# Patient Record
Sex: Female | Born: 1960
Health system: Southern US, Community
[De-identification: ages and names within clinical notes are randomized; demographics above are authoritative.]

## PROBLEM LIST (undated history)

## (undated) DIAGNOSIS — I471 Supraventricular tachycardia, unspecified: Secondary | ICD-10-CM

## (undated) DIAGNOSIS — I456 Pre-excitation syndrome: Secondary | ICD-10-CM

## (undated) DIAGNOSIS — I10 Essential (primary) hypertension: Secondary | ICD-10-CM

## (undated) DIAGNOSIS — E119 Type 2 diabetes mellitus without complications: Secondary | ICD-10-CM

## (undated) DIAGNOSIS — M1990 Unspecified osteoarthritis, unspecified site: Secondary | ICD-10-CM

## (undated) DIAGNOSIS — R51 Headache: Secondary | ICD-10-CM

## (undated) DIAGNOSIS — I671 Cerebral aneurysm, nonruptured: Secondary | ICD-10-CM

## (undated) DIAGNOSIS — R519 Headache, unspecified: Secondary | ICD-10-CM

## (undated) DIAGNOSIS — E079 Disorder of thyroid, unspecified: Secondary | ICD-10-CM

## (undated) HISTORY — PX: BREAST SURGERY: SHX581

## (undated) HISTORY — PX: ELECTROPHYSIOLOGIC STUDY: SHX172A

---

## 1998-07-26 ENCOUNTER — Emergency Department (HOSPITAL_COMMUNITY): Admission: EM | Admit: 1998-07-26 | Discharge: 1998-07-26 | Payer: Self-pay | Admitting: Emergency Medicine

## 1998-10-01 ENCOUNTER — Encounter: Admission: RE | Admit: 1998-10-01 | Discharge: 1998-12-30 | Payer: Self-pay

## 2001-06-21 ENCOUNTER — Encounter: Payer: Self-pay | Admitting: Internal Medicine

## 2001-06-21 ENCOUNTER — Ambulatory Visit (HOSPITAL_COMMUNITY): Admission: RE | Admit: 2001-06-21 | Discharge: 2001-06-21 | Payer: Self-pay | Admitting: Internal Medicine

## 2001-12-17 ENCOUNTER — Emergency Department (HOSPITAL_COMMUNITY): Admission: EM | Admit: 2001-12-17 | Discharge: 2001-12-17 | Payer: Self-pay | Admitting: Emergency Medicine

## 2004-01-24 ENCOUNTER — Ambulatory Visit (HOSPITAL_COMMUNITY): Admission: RE | Admit: 2004-01-24 | Discharge: 2004-01-24 | Payer: Self-pay | Admitting: Family Medicine

## 2004-01-31 ENCOUNTER — Ambulatory Visit (HOSPITAL_COMMUNITY): Admission: RE | Admit: 2004-01-31 | Discharge: 2004-01-31 | Payer: Self-pay | Admitting: Family Medicine

## 2004-02-29 ENCOUNTER — Ambulatory Visit (HOSPITAL_COMMUNITY): Admission: RE | Admit: 2004-02-29 | Discharge: 2004-02-29 | Payer: Self-pay | Admitting: Family Medicine

## 2004-06-13 ENCOUNTER — Emergency Department (HOSPITAL_COMMUNITY): Admission: EM | Admit: 2004-06-13 | Discharge: 2004-06-13 | Payer: Self-pay | Admitting: Emergency Medicine

## 2006-06-02 ENCOUNTER — Ambulatory Visit (HOSPITAL_COMMUNITY): Admission: RE | Admit: 2006-06-02 | Discharge: 2006-06-02 | Payer: Self-pay | Admitting: Family Medicine

## 2007-11-16 ENCOUNTER — Other Ambulatory Visit: Admission: RE | Admit: 2007-11-16 | Discharge: 2007-11-16 | Payer: Self-pay | Admitting: Family Medicine

## 2007-11-22 ENCOUNTER — Ambulatory Visit (HOSPITAL_COMMUNITY): Admission: RE | Admit: 2007-11-22 | Discharge: 2007-11-22 | Payer: Self-pay | Admitting: Family Medicine

## 2008-12-29 ENCOUNTER — Ambulatory Visit: Payer: Self-pay | Admitting: *Deleted

## 2008-12-30 ENCOUNTER — Other Ambulatory Visit: Payer: Self-pay | Admitting: Emergency Medicine

## 2008-12-30 ENCOUNTER — Inpatient Hospital Stay (HOSPITAL_COMMUNITY): Admission: EM | Admit: 2008-12-30 | Discharge: 2008-12-30 | Payer: Self-pay | Admitting: Internal Medicine

## 2009-01-09 ENCOUNTER — Ambulatory Visit: Payer: Self-pay | Admitting: Internal Medicine

## 2009-02-15 ENCOUNTER — Ambulatory Visit: Payer: Self-pay | Admitting: Internal Medicine

## 2009-02-16 ENCOUNTER — Ambulatory Visit: Payer: Self-pay | Admitting: Internal Medicine

## 2009-02-16 ENCOUNTER — Ambulatory Visit (HOSPITAL_COMMUNITY): Admission: RE | Admit: 2009-02-16 | Discharge: 2009-02-17 | Payer: Self-pay | Admitting: Internal Medicine

## 2009-03-20 DIAGNOSIS — J40 Bronchitis, not specified as acute or chronic: Secondary | ICD-10-CM | POA: Insufficient documentation

## 2009-03-20 DIAGNOSIS — I456 Pre-excitation syndrome: Secondary | ICD-10-CM | POA: Insufficient documentation

## 2009-03-20 DIAGNOSIS — I10 Essential (primary) hypertension: Secondary | ICD-10-CM | POA: Insufficient documentation

## 2009-03-20 DIAGNOSIS — F17200 Nicotine dependence, unspecified, uncomplicated: Secondary | ICD-10-CM | POA: Insufficient documentation

## 2009-03-20 DIAGNOSIS — I471 Supraventricular tachycardia: Secondary | ICD-10-CM | POA: Insufficient documentation

## 2009-03-26 ENCOUNTER — Encounter: Payer: Self-pay | Admitting: Internal Medicine

## 2009-03-26 ENCOUNTER — Ambulatory Visit: Payer: Self-pay | Admitting: Internal Medicine

## 2011-01-12 ENCOUNTER — Encounter: Payer: Self-pay | Admitting: Family Medicine

## 2011-01-21 NOTE — Assessment & Plan Note (Signed)
Summary: [facility]   CC:  pt complains of chest pain last week and sob also pt states hearts skipping beats also pt is not on any meds.  History of Present Illness: Ms. [name] [name] reuturns today for follow-up.  She is a middle aged woman with a h/o SVT and heart rates of over 200 beats per minute.  She underwent EP study and ablation several weeks ago.  At that time she was found to have a manifest accessory pathway which was mapped to the anteroseptal space.  She underwent successful ablation resulting in resolution of accessory pathway conduction.  Since then she has had very minimal C/P, no radiation, SOB or syncope.  The pain is not exertional.  No exacerbating features.  She has had minimal palpitations lasting seconds.  She admits to dietary indiscretion with sodium.  Allergies: No Known Drug Allergies  Past History:  Past Medical History:    HYPERTENSION, UNSPECIFIED (ICD-401.9)    SMOKER (ICD-305.1)    SVT/ PSVT/ PAT (ICD-427.0)    Hx of BRONCHITIS (ICD-490)    WPW (ICD-426.7)     (03/20/2009)  Past Surgical History:    S/P SVT ablation 02/2009  Vital Signs:  Patient profile:   50 year old female Weight:      198 pounds Pulse rate:   79 / minute BP sitting:   120 / 80  (left arm)  Vitals Entered By: Kem Parkinson (March 26, 2009 4:02 PM)  Physical Exam  General:  Well developed, well nourished, in no acute distress. Head:  normocephalic and atraumatic Eyes:  PERRLA/EOM intact; conjunctiva and lids normal. Mouth:  Teeth, gums and palate normal except poor dentition. Oral mucosa normal. Neck:  Neck supple, no JVD. No masses, thyromegaly or abnormal cervical nodes. Lungs:  Clear bilaterally to auscultation and percussion. Heart:  Non-displaced PMI, chest non-tender; regular rate and rhythm, S1, S2 without murmurs, rubs or gallops. Carotid upstroke normal, no bruit. Normal abdominal aortic size, no bruits. Femorals normal pulses, no bruits. Pedals normal pulses.  No edema, no varicosities. Abdomen:  Bowel sounds positive; abdomen soft and non-tender without masses, organomegaly, or hernias noted. No hepatosplenomegaly. Msk:  Back normal, normal gait. Muscle strength and tone normal. Pulses:  pulses normal in all 4 extremities Extremities:  No clubbing or cyanosis. Neurologic:  Alert and oriented x 3.   EKG  Procedure date:  03/26/2009  Findings:      NSR without evidence of ventricular pre-excitation.  Impression & Recommendations:  Problem # 1:  SVT/ PSVT/ PAT (ICD-427.0) She is now s/p successful ablation and doing well.  No evidence of additional SVT or accessory pathway conduction.  Problem # 2:  HYPERTENSION, UNSPECIFIED (ICD-401.9) I discussed the importance of sodium restriction.

## 2011-04-07 LAB — CARDIAC PANEL(CRET KIN+CKTOT+MB+TROPI)
CK, MB: 1.8 ng/mL (ref 0.3–4.0)
Relative Index: 0.9 (ref 0.0–2.5)
Total CK: 192 U/L — ABNORMAL HIGH (ref 7–177)
Troponin I: <0.01 ng/mL (ref 0.00–0.06)

## 2011-04-07 LAB — CBC
HCT: 42 % (ref 36.0–46.0)
Hemoglobin: 13.1 g/dL (ref 12.0–15.0)
MCHC: 31.3 g/dL (ref 30.0–36.0)
MCV: 82.7 fL (ref 78.0–100.0)
Platelets: 280 10*3/uL (ref 150–400)
RBC: 5.08 MIL/uL (ref 3.87–5.11)
RDW: 16.2 % — ABNORMAL HIGH (ref 11.5–15.5)
WBC: 9.9 10*3/uL (ref 4.0–10.5)

## 2011-04-07 LAB — POCT CARDIAC MARKERS
CKMB, poc: <1 ng/mL — ABNORMAL LOW (ref 1.0–8.0)
CKMB, poc: <1 ng/mL — ABNORMAL LOW (ref 1.0–8.0)
Myoglobin, poc: 82.7 ng/mL (ref 12–200)
Myoglobin, poc: 98.1 ng/mL (ref 12–200)
Troponin i, poc: <0.05 ng/mL (ref 0.00–0.09)
Troponin i, poc: <0.05 ng/mL (ref 0.00–0.09)

## 2011-04-07 LAB — BASIC METABOLIC PANEL
BUN: 10 mg/dL (ref 6–23)
CO2: 25 mEq/L (ref 19–32)
Calcium: 9.3 mg/dL (ref 8.4–10.5)
Chloride: 105 mEq/L (ref 96–112)
Creatinine, Ser: 1.13 mg/dL (ref 0.4–1.2)
GFR calc Af Amer: >60 mL/min (ref >60)
GFR calc non Af Amer: 51 mL/min — ABNORMAL LOW (ref >60)
Glucose, Bld: 96 mg/dL (ref 70–99)
Potassium: 3.4 mEq/L — ABNORMAL LOW (ref 3.5–5.1)
Sodium: 140 mEq/L (ref 135–145)

## 2011-04-07 LAB — DIFFERENTIAL
Basophils Absolute: 0.2 10*3/uL — ABNORMAL HIGH (ref 0.0–0.1)
Basophils Relative: 2 % — ABNORMAL HIGH (ref 0–1)
Eosinophils Absolute: 0.1 10*3/uL (ref 0.0–0.7)
Eosinophils Relative: 1 % (ref 0–5)
Lymphocytes Relative: 32 % (ref 12–46)
Lymphs Abs: 3.1 10*3/uL (ref 0.7–4.0)
Monocytes Absolute: 1.1 10*3/uL — ABNORMAL HIGH (ref 0.1–1.0)
Monocytes Relative: 12 % (ref 3–12)
Neutro Abs: 5.3 10*3/uL (ref 1.7–7.7)
Neutrophils Relative %: 54 % (ref 43–77)

## 2011-04-07 LAB — TSH: TSH: 10.004 u[IU]/mL — ABNORMAL HIGH (ref 0.350–4.500)

## 2011-05-06 NOTE — Assessment & Plan Note (Signed)
[facility]                         ELECTROPHYSIOLOGY OFFICE NOTE   NAME:[name] [name] HIPPERT                      MRN:          [id number]  DATE:01/09/2009                            DOB:          [birth year]    [name] [name] is referred by Dr. Loleta Chance and Dr. Dietrich Pates and Dr. Daleen Squibb for  evaluation of WPW syndrome and documented SVT.   The patient is a 50 year old.  She has had longstanding tachy  palpitations dating back her teenage years.  The patient has had  intermittent episodes of SVT at rates of up to 200 beats per minute.  She was most recently seen in the emergency department where she was in  a narrow QRS tachycardia at 198 beats per minute.  This was treated with  adenosine x2 with eventual termination of her SVT.  With SVT, she has  chest pressure and shortness of breath.  She has never had frank  syncope.  The patient has otherwise been stable.   Her past medical history is notable for thyroid problems with  radioactive iodine and thyroid replacement.  She has a history of  borderline hypertension.   Her family history is notable for father with coronary artery disease,  otherwise unremarkable.   SOCIAL HISTORY:  The patient denies alcohol abuse, but does smoke  cigarettes on a regular basis typically between whole and half pack a  day and has done so for all of her adult life.  She drinks 1-2  caffeinated beverages daily as well.  She has no other surgeries in the  past.  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  She is married.   Her review of systems is as noted in the HPI, otherwise she has very  minimal arthritic symptoms on physical.  Otherwise, all systems are  negative.   PHYSICAL EXAMINATION:  GENERAL:  She is a pleasant 50 year old woman in  no acute distress.  VITAL SIGNS:  Blood pressure today was 118/80, pulse was 64 and regular,  respirations were 16, and the weight was 200 pounds.  HEENT:  Normocephalic and atraumatic.   Oropharynx is moist.  Sclerae anicteric.  NECK:  No jugular venous distention.  There is no thyromegaly.  Trachea  is midline.  The carotids are 2+ and symmetric.  LUNGS:  Clear bilaterally to auscultation.  No wheezes, rales, or  rhonchi present.  There is no increased work of breathing.  CARDIOVASCULAR:  Regular rate and rhythm.  Normal S1 and S2.  There is  soft S4 gallop.  There were no murmurs, rubs, or gallops, otherwise.  ABDOMEN:  Soft, nontender.  There is no organomegaly.  EXTREMITIES:  No cyanosis, clubbing, or edema.  Pulses are 2+ and  symmetric.  NEUROLOGIC:  Alert and oriented x3 with cranial nerves intact.  Strength  was 5/5 and symmetric.   EKG demonstrates sinus rhythm with a very short PR interval consistent  with Wolff-Parkinson-White syndrome.  The patient's 12-lead EKG  demonstrates a very very short interval and a delta wave, which  transitions from negative to positive in lead V4.  The delta wave is  also positive in II, III, and F.  The aVR was negative and aVL was  positive.  These findings are suggestive of a right free wall or perhaps  a right anteroseptal accessory pathway.   IMPRESSION:  Symptomatic Wolff-Parkinson-White syndrome.  I have  discussed the treatment options with the patient.  The risks, benefits,  goals, and expectations of catheter ablation for supraventricular  tachycardia have been discussed with her.  She will call us to schedule  catheter ablation therapy.     Doylene Canning. Ladona Ridgel, MD  Electronically Signed    GWT/MedQ  DD: 01/09/2009  DT: 01/10/2009  Job #: [id number]   cc:   Thomas C. Wall, MD, [facility]

## 2011-05-06 NOTE — Discharge Summary (Signed)
NAMEAILANY, [name] NO.:  [id number]   MEDICAL RECORD NO.:  [id number]          PATIENT TYPE:  OIB   LOCATION:  2027                         FACILITY:  [facility]   PHYSICIAN:  Duke Salvia, MD, FACCDATE OF BIRTH:  [birth year]   DATE OF ADMISSION:  02/16/2009  DATE OF DISCHARGE:  02/17/2009                               DISCHARGE SUMMARY   DISCHARGE DIAGNOSIS:  Status post radiofrequency catheter ablation for  Wolff-Parkinson-White.  The patient being treated with aspirin 81 mg  daily for 6 weeks.  Follow up with Dr. Ladona Ridgel in [place] in 6-8  weeks.  Office will arrange appointment.   MEDICATION AT TIME OF DISCHARGE:  Aspirin 81 mg x6 weeks.   The patient is followed by Dr. Loleta Chance and Dr. Dietrich Pates.  She has a history  of WPW syndrome and documented SVT.  She was seen by Dr. Lewayne Bunting in  consultation for consideration of an ablation.  The patient agreed to  proceed and was admitted on day of admission for procedure, tolerated  procedure without complications.  No evidence of heart block  postprocedure.  No inducible SVT.  Postprocedure, the patient was  transferred to telemetry.  For observation overnight, Dr. Graciela Husbands saw the  patient on day of discharge, right groin is stable.  The patient  afebrile, being discharged home, and follow up as stated above.   DURATION OF DISCHARGE ENCOUNTER:  Less than 30 minutes.      Dorian Pod, ACNP      Duke Salvia, MD, [facility]  Electronically Signed    MB/MEDQ  D:  02/17/2009  T:  02/17/2009  Job:  [id number]

## 2011-05-06 NOTE — Discharge Summary (Signed)
NAME:  [name] [name] NO.:  [id number]   MEDICAL RECORD NO.:  [id number]          PATIENT TYPE:  INP   LOCATION:  6527                         FACILITY:  [facility]   PHYSICIAN:  Thomas C. Wall, MD, FACCDATE OF BIRTH:  1961/01/17   DATE OF ADMISSION:  12/30/2008  DATE OF DISCHARGE:  12/30/2008                               DISCHARGE SUMMARY   PROCEDURES:  None.   PRIMARY/FINAL DISCHARGE DIAGNOSIS:  Tachy palpitations, atrioventricular  nodal reentry tachycardia with possible Wolff-Parkinson-White.   SECONDARY DIAGNOSES:  1. Family history of coronary artery disease in her father.  2. Obesity with a body mass index of 34.9.  3. Hypokalemia with potassium of 3.4 on admission.  4. Borderline hypertension with systolic blood pressure of 139 and      diastolic blood pressure between 76 and 104.   TIME AT DISCHARGE:  32 minutes.   HOSPITAL COURSE:  [name] [name] is a 50 year old female with a history of  arrhythmia, possibly WPW.  She had tachy palpitations and came to the  hospital.  The tachy palpitations were associated with substernal chest  pain and diaphoresis.  The EKG demonstrated narrow QRS tachycardia with  a nonspecific intraventricular block.  There was a possibility of a  delta wave versus LGL variant.  She was admitted for further evaluation.   Her cardiac enzymes were negative for MI.  Her other labs were within  normal limits except for a potassium that was slightly low at 3.4 and  was supplemented.  A TSH is pending.   On December 30, 2008, [name] [name]'s symptoms had resolved.  She was having  no further palpitations or chest pain.  Dr. Daleen Squibb evaluated her and felt  she was stable for discharge on December 30, 2008.   DISCHARGE INSTRUCTIONS:  Activity level is to be increased gradually.  She is to stick to a low-fat diet.  She is to followup with [facility]  Cardiology EP and our office will call her.  She is to follow up with  Dr. Loleta Chance as needed.   DISCHARGE MEDICATIONS:  None for now.      Theodore Demark, PA-C      Jesse Sans. Daleen Squibb, MD, [facility]  Electronically Signed    RB/MEDQ  D:  12/30/2008  T:  12/30/2008  Job:  [id number]   cc:   Annia Friendly. Loleta Chance, MD
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
## 2011-05-06 NOTE — Op Note (Signed)
[name] [name], [name] [name] NO.:  [id number]   MEDICAL RECORD NO.:  [id number]          PATIENT TYPE:  OIB   LOCATION:  2027                         FACILITY:  [facility]   PHYSICIAN:  Doylene Canning. Ladona Ridgel, MD    DATE OF BIRTH:  1961-06-28   DATE OF PROCEDURE:  02/16/2009  DATE OF DISCHARGE:                               OPERATIVE REPORT   PROCEDURE PERFORMED:  Electrophysiologic study and radiofrequency  catheter ablation of arteriovenous reentrant tachycardia utilizing a  manifest para-Hisian accessory pathway.   INTRODUCTION:  The patient is a 50 year old woman with a longstanding  history of tachy palpitations with documented episodes of SVT at rates  of up to 220 beats per minute.  She has clear-cut pre-excitation on her  12 lead EKG.  The patient presents to the hospital today for  electrophysiologic study and catheter ablation after she had recurrent  episodes of SVT and had been unable to tolerate medical therapy.   PROCEDURE:  After informed consent was obtained, the patient was taken  to the diagnostic EP lab in the fasting state.  After usual preparation  and draping, intravenous fentanyl and midazolam was given for sedation.  A 6-French hexapolar catheter was inserted percutaneously into the right  femoral vein and advanced under fluoroscopic guidance to the right  ventricle.  A 5-French quadripolar catheter was inserted percutaneously  in the right femoral vein and advanced to the His bundle region.  A 6-  [place] hexapolar catheter was inserted percutaneously into the right  jugular vein and advanced to the coronary sinus.  After measurement of  the basic intervals, rapid ventricular pacing was carried out from the  RV apex demonstrating midline, but nondecremental VA conduction, the  pathway block was 320 msec.  Next, programmed ventricular stimulation  was carried out from the RV apex at base drive cycle length of 409 msec.  The S1-S2 interval was stepwise  decreased down to 340 msec where the  retrograde pathway block was demonstrated.  At this point, programmed  atrial stimulation was carried out from the coronary sinus at base drive  cycle length of 811 msec and the S1-S2 interval stepwise decreased down  to under 330 msec where the antegrade pathway block was demonstrated.  Additional decrements down demonstrated AV node ERP.  At this point,  rapid atrial pacing was carried out from the coronary sinus at a pacing  cycle length of 490 msec and stepwise decreased down to approximately  320 msec resulting in the initiation of atrial fibrillation.  The  patient required cardioversion on 2 separate occasions.  It should be  noted that following atrial fibrillation, additional programmed atrial  stimulation was carried out from the coronary sinus demonstrating AV  reentrant tachycardia utilizing a para-Hisian accessory pathway for the  retrograde limb of the circuit.  This pathway location was confirmed and  the 20-pole Halo catheter was inserted into the right femoral vein and  advanced under fluoroscopic guidance into the right atrium and placed  around tricuspid valve annulus.  During tachycardia, the earliest atrial  activation was in the midline anteroseptal/para-Hisian space.  At this  point, a 7-French quadripolar ablation catheter was maneuvered by way of  the right femoral vein into the right atrium and mapping was carried  out.  Mapping was carried out both with rapid atrial pacing from the  Halo catheter near the patient's accessory pathway as well as from the  pera-Hisian space just inside the right ventricle.  Again, this  demonstrated fusion of atrial activation and ventricular activation both  pacing the A as well as pacing the V along the region of the para-Hisian  space.  The earliest activation was found just above the His catheter at  about 1 o'clock in the LAO projection.  Additional fine mapping was  carried out and with  interspace less than one catheter's width from the  His bundle catheter, the pathway was minimally bumped resulting in loss  of pre-excitation.  Fortunately, pre-excitation came back within a  matter of a minute and additional mapping was carried out followed by a  single RF energy application, which was delivered for 60 seconds.  This  resulted in termination of accessory pathway conduction within a few  seconds of RF energy application.  During this time, the PR interval  remained unchanged once accessory pathway conduction was abolished.  There is no junctional rhythm noted.  The patient was observed for 40  minutes.  During this time, there was no return of accessory pathway  conduction and rapid ventricular pacing then carried out demonstrating  VA dissociation at 600 msec.  Programmed atrial stimulation and rapid  atrial pacing were also carried out demonstrating no inducible AFib  (this had been easily inducible prior to ablation) and no inducible SVT.  The catheters were then removed.  Hemostasis was assured and the patient  was returned to her room in satisfactory condition.   COMPLICATIONS:  There were no immediate procedure complications.   RESULTS:  A.  Baseline ECG.  The baseline ECG demonstrates sinus rhythm  with manifest pre-excitation.  B.  Baseline intervals.  The PR interval was 79 msec.  The HV interval  was less than 0.  The QRS duration was 170 msec.  Following ablation,  the AH interval was 112 msec.  The HV interval was 44 msec and a QRS  duration, which was at previously 170 msec became 84 msec.  C.  Rapid ventricular pacing.  Rapid ventricular pacing was carried out  from the RV apex; both before and after ablation.  After ablation, VA  dissociation was present.  During prior to ablation the retrograde  pathway Wenckebach cycle length was 320 msec.  D.  Programmed ventricular stimulation.  Programmed ventricular  stimulation was carried out from the RV apex at  base drive cycle length  of 161 msec.  The S1-S2 interval stepwise decreased to 340 msec with the  retrograde accessory pathway ERP was noted.  E.  Rapid atrial pacing.  Rapid atrial pacing was carried out both  before and after catheter ablation.  The AV Wenckebach cycle length was  330 msec after RF energy application.  Prior to RF energy application,  rapid atrial pacing resulted in the induction of AFib.  F.  Programmed atrial stimulation.  Programmed atrial stimulation was  carried out from the coronary sinus and high right atrium base drive  cycle length of 096 msec.  The S1-S2 interval stepwise decreased to 330  msec where the accessory pathway ERP was observed.  G.  Arrhythmias observed.  1. AV reentry tachycardia.  Initiation was of  programmed atrial      stimulation, duration was sustained, and termination was      spontaneous.  2. Atrial fibrillation initiation was with rapid atrial pacing as well      as spontaneous from SVT.  Duration was sustained termination was      with DC cardioversion as well as with ibutilide.      a.     Mapping.  Mapping of the patient's accessory pathway was       localized to the para-Hisian space just above the AV node.      b.     RF energy application.  Single RF energy application was       delivered resulting in resolution of accessory pathway conduction.       There were no junctional beats noted during RF energy application       and there was no evidence of any creation of heart block.   CONCLUSION:  The study demonstrates successful electrophysiologic study  and RF catheter ablation of a manifest WPW syndrome with inducible AV  reentry tachycardia.  Successful ablation was applied with a single RF  energy application to the para-Hisian space resulting in resolution of  accessory pathway conduction and rendering the patient's tachycardia not  inducible.      Doylene Canning. Ladona Ridgel, MD  Electronically Signed     GWT/MEDQ  D:  02/16/2009   T:  02/16/2009  Job:  [id number]   cc:   Jesse Sans. Daleen Squibb, MD, [facility]  Annia Friendly. Loleta Chance, MD

## 2011-05-06 NOTE — Assessment & Plan Note (Signed)
[facility]                         ELECTROPHYSIOLOGY OFFICE NOTE   NAME:Rund, [name] [name]                      MRN:          [id number]  DATE:02/15/2009                            DOB:          [birth year]    [name] returns today for followup.  She is a very pleasant middle-  aged woman with a history of SVT and WPW syndrome.  She has history of  hypertension.  Since I last saw her, she has had recurrent tachy  palpitations, though they have not been quite as long as usual.  The  most recent, the patient's documented SVT was at 200 beats per minute  back on January 8.  Her baseline ECG demonstrates sinus rhythm with a  left bundle-branch block.  QRS pattern with transition from negative to  positive at lead V3 indicative of either a septal or right free wall  accessory pathway.  I favor the latter as the transition to be positive  V3 rather than V2.  The patient had no other complaints today.  She is  appropriately anxious for her ablation procedure.   PHYSICAL EXAMINATION:  GENERAL:  She is a pleasant, middle-aged woman,  in no acute distress.  VITAL SIGNS:  The blood pressure is 130/80, the pulse was 76 and  regular, respirations were 18.  Weight was 200 pounds.  HEENT:  Normocephalic and atraumatic.  Pupils equal and round.  Oropharynx is  moist.  Sclerae anicteric.  NECK:  No jugular distention.  No thyromegaly.  Trachea is midline.  LUNGS:  Clear bilaterally to auscultation.  No wheezes, rales, or  rhonchi are present and no increased work of breathing.  CARDIOVASCULAR:  Regular rate and rhythm.  Normal S1 and S2.  ABDOMEN:  Soft, nontender.  EXTREMITIES:  Demonstrated no edema.   IMPRESSION:  1. Recurrent supraventricular tachycardia.  2. Wolff-Parkinson-White syndrome.  3. Ongoing tobacco abuse.  4. Hypertension.   DISCUSSION:  I have discussed the treatment options with the patient.  The risks, benefits, goals, and expectations of  electrophysiologic study  and catheter ablation discussed with the patient.  She would like to  proceed, this be scheduled early as possible in convenient time.     Doylene Canning. Ladona Ridgel, MD  Electronically Signed    GWT/MedQ  DD: 02/15/2009  DT: 02/15/2009  Job #: [id number]

## 2013-06-09 ENCOUNTER — Emergency Department (HOSPITAL_COMMUNITY)
Admission: EM | Admit: 2013-06-09 | Discharge: 2013-06-09 | Disposition: A | Discharge status: Home / Self Care | Payer: BC Managed Care – PPO | Attending: Emergency Medicine | Admitting: Emergency Medicine

## 2013-06-09 ENCOUNTER — Encounter (HOSPITAL_COMMUNITY): Payer: Self-pay | Admitting: *Deleted

## 2013-06-09 ENCOUNTER — Emergency Department (HOSPITAL_COMMUNITY): Payer: BC Managed Care – PPO

## 2013-06-09 DIAGNOSIS — S139XXA Sprain of joints and ligaments of unspecified parts of neck, initial encounter: Secondary | ICD-10-CM | POA: Insufficient documentation

## 2013-06-09 DIAGNOSIS — M549 Dorsalgia, unspecified: Secondary | ICD-10-CM | POA: Insufficient documentation

## 2013-06-09 DIAGNOSIS — Y9389 Activity, other specified: Secondary | ICD-10-CM | POA: Insufficient documentation

## 2013-06-09 DIAGNOSIS — R079 Chest pain, unspecified: Secondary | ICD-10-CM | POA: Insufficient documentation

## 2013-06-09 DIAGNOSIS — T148XXA Other injury of unspecified body region, initial encounter: Secondary | ICD-10-CM

## 2013-06-09 DIAGNOSIS — S161XXA Strain of muscle, fascia and tendon at neck level, initial encounter: Secondary | ICD-10-CM

## 2013-06-09 DIAGNOSIS — Y9229 Other specified public building as the place of occurrence of the external cause: Secondary | ICD-10-CM | POA: Insufficient documentation

## 2013-06-09 MED ORDER — IBUPROFEN 800 MG PO TABS
800.0000 mg | ORAL_TABLET | Freq: Once | ORAL | Status: AC
  Administered 2013-06-09: 800 mg via ORAL
  Filled 2013-06-09: qty 1

## 2013-06-09 MED ORDER — CYCLOBENZAPRINE HCL 10 MG PO TABS: 10.0000 mg | ORAL_TABLET | Freq: Three times a day (TID) | ORAL | Status: DC | PRN

## 2013-06-09 MED ORDER — IBUPROFEN 800 MG PO TABS: 800.0000 mg | ORAL_TABLET | Freq: Three times a day (TID) | ORAL | Status: DC
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
## 2013-06-09 NOTE — ED Notes (Signed)
Instructions, prescriptions and f/u information given/reviewed - verbalizes understanding.  
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
## 2013-06-10 NOTE — ED Provider Notes (Signed)
Medical screening examination/treatment/procedure(s) were performed by non-physician practitioner and as supervising physician I was immediately available for consultation/collaboration.    Vida Roller, MD 06/10/13 [phone]

## 2015-08-23 ENCOUNTER — Other Ambulatory Visit (HOSPITAL_COMMUNITY): Payer: Self-pay | Admitting: *Deleted

## 2015-08-23 DIAGNOSIS — Z1231 Encounter for screening mammogram for malignant neoplasm of breast: Secondary | ICD-10-CM

## 2015-08-30 ENCOUNTER — Inpatient Hospital Stay (HOSPITAL_COMMUNITY): Admission: RE | Admit: 2015-08-30 | Payer: Self-pay | Source: Ambulatory Visit

## 2015-09-04 ENCOUNTER — Encounter (HOSPITAL_COMMUNITY): Payer: Self-pay | Admitting: Emergency Medicine

## 2015-09-04 ENCOUNTER — Emergency Department (HOSPITAL_COMMUNITY)
Admission: EM | Admit: 2015-09-04 | Discharge: 2015-09-05 | Disposition: A | Discharge status: Home / Self Care | Payer: Medicaid Other | Attending: Emergency Medicine | Admitting: Emergency Medicine

## 2015-09-04 DIAGNOSIS — Z791 Long term (current) use of non-steroidal anti-inflammatories (NSAID): Secondary | ICD-10-CM | POA: Insufficient documentation

## 2015-09-04 DIAGNOSIS — L509 Urticaria, unspecified: Secondary | ICD-10-CM | POA: Insufficient documentation

## 2015-09-04 DIAGNOSIS — Z720 Tobacco use: Secondary | ICD-10-CM | POA: Insufficient documentation

## 2015-09-04 MED ORDER — PREDNISONE 20 MG PO TABS: ORAL_TABLET | ORAL | Status: DC

## 2015-09-04 MED ORDER — FAMOTIDINE 20 MG PO TABS
20.0000 mg | ORAL_TABLET | Freq: Once | ORAL | Status: AC
  Administered 2015-09-04: 20 mg via ORAL
  Filled 2015-09-04: qty 1

## 2015-09-04 MED ORDER — DIPHENHYDRAMINE HCL 25 MG PO TABS: 25.0000 mg | ORAL_TABLET | ORAL | Status: DC | PRN

## 2015-09-04 MED ORDER — PREDNISONE 50 MG PO TABS
60.0000 mg | ORAL_TABLET | Freq: Once | ORAL | Status: AC
  Administered 2015-09-04: 60 mg via ORAL
  Filled 2015-09-04 (×2): qty 1

## 2015-09-04 MED ORDER — DIPHENHYDRAMINE HCL 25 MG PO CAPS
50.0000 mg | ORAL_CAPSULE | Freq: Once | ORAL | Status: AC
  Administered 2015-09-04: 50 mg via ORAL
  Filled 2015-09-04: qty 2

## 2015-09-04 NOTE — ED Provider Notes (Signed)
CSN: [id number]     Arrival date & time 09/04/15  2223 History   First MD Initiated Contact with Patient 09/04/15 2246     Chief Complaint  Patient presents with  . Rash     (Consider location/radiation/quality/duration/timing/severity/associated sxs/prior Treatment) HPI   [name] [name] is a 54 y.o. female who presents to the Emergency Department complaining of itching rash for 3 days.  She states that she began to notice a red rash to her feet, legs, hands and back.  She states that she has been applying lotion without relief.  She also states she recently completely an antibiotic, unsure of the name, but states it was for an urinary tract infection.  She denies swelling, difficulty swallowing, wheezing or shortness of breath.  She also denies any new exposures or foods.   History reviewed. No pertinent past medical history. History reviewed. No pertinent past surgical history. No family history on file. Social History  Substance Use Topics  . Smoking status: Current Every Day Smoker -- 1.00 packs/day  . Smokeless tobacco: None  . Alcohol Use: No   OB History    No data available     Review of Systems  Constitutional: Negative for fever, chills, activity change and appetite change.  HENT: Negative for facial swelling, sore throat and trouble swallowing.   Respiratory: Negative for chest tightness, shortness of breath and wheezing.   Musculoskeletal: Negative for neck pain and neck stiffness.  Skin: Positive for rash. Negative for wound.  Neurological: Negative for dizziness, weakness, numbness and headaches.  All other systems reviewed and are negative.     Allergies  Review of patient's allergies indicates no known allergies.  Home Medications   Prior to Admission medications   Medication Sig Start Date End Date Taking? Authorizing Provider  cyclobenzaprine (FLEXERIL) 10 MG tablet Take 1 tablet (10 mg total) by mouth 3 (three) times daily as needed for muscle  spasms. 06/09/13   Latham Kinzler, PA-C  ibuprofen (ADVIL,MOTRIN) 800 MG tablet Take 1 tablet (800 mg total) by mouth 3 (three) times daily. 06/09/13   Liviya Santini, PA-C   BP 142/68 mmHg  Pulse 77  Temp(Src) 97.9 F (36.6 C) (Oral)  Resp 18  Ht 5\' 3"  (1.6 m)  Wt 185 lb (83.915 kg)  BMI 32.78 kg/m2  SpO2 100%   Physical Exam  Constitutional: She is oriented to person, place, and time. She appears well-developed and well-nourished. No distress.  HENT:  Head: Normocephalic and atraumatic.  Mouth/Throat: Oropharynx is clear and moist.  Neck: Normal range of motion. Neck supple.  Cardiovascular: Normal rate, regular rhythm, normal heart sounds and intact distal pulses.   No murmur heard. Pulmonary/Chest: Effort normal and breath sounds normal. No respiratory distress.  Musculoskeletal: She exhibits no edema or tenderness.  Lymphadenopathy:    She has no cervical adenopathy.  Neurological: She is alert and oriented to person, place, and time. She exhibits normal muscle tone. Coordination normal.  Skin: Skin is warm. Rash noted. There is erythema.  Erythematous, mildly edematous welts to the dorsal feet, upper back and bilateral legs.  No vesicles or pustules  Nursing note and vitals reviewed.   ED Course  Procedures (including critical care time)   MDM   Final diagnoses:  Urticaria    Pt is well appearing.  Vitals stable.  Airway patent.  No edema.  Mild rash appears c/w hives.  Will give prednisone, Pepcid and benadryl and observe.    2340 on recheck patient is  laughing and talking with family member at bedside.  States she is feeling better and itching has resolved.  She appears stable for d/c and agrees to continue benadryl and prednisone taper prescribed.  Advised to return here for any worsening symtpoms  Kem Parkinson, PA-C 09/04/15 2346  Tanna Furry, MD 09/08/15 [phone]

## 2015-09-04 NOTE — Discharge Instructions (Signed)
Hives  Hives are itchy, red, puffy (swollen) areas of the skin. Hives can change in size and location on your body. Hives can come and go for hours, days, or weeks. Hives do not spread from person to person (noncontagious). Scratching, exercise, and stress can make your hives worse.  HOME CARE  · Avoid things that cause your hives (triggers).  · Take antihistamine medicines as told by your doctor. Do not drive while taking an antihistamine.  · Take any other medicines for itching as told by your doctor.  · Wear loose-fitting clothing.  · Keep all doctor visits as told.  GET HELP RIGHT AWAY IF:   · You have a fever.  · Your tongue or lips are puffy.  · You have trouble breathing or swallowing.  · You feel tightness in the throat or chest.  · You have belly (abdominal) pain.  · You have lasting or severe itching that is not helped by medicine.  · You have painful or puffy joints.  These problems may be the first sign of a life-threatening allergic reaction. Call your local emergency services (911 in [place]  MAKE SURE YOU:   · Understand these instructions.  · Will watch your condition.  · Will get help right away if you are not doing well or get worse.  Document Released: 09/16/2008 Document Revised: 06/08/2012 Document Reviewed: 03/02/2012  [employer]® Patient Information ©2015 [employer]. This information is not intended to replace advice given to you by your health care provider. Make sure you discuss any questions you have with your health care provider.

## 2015-09-04 NOTE — ED Notes (Signed)
Onset 3 day redness to hands and feet, itching

## 2015-11-12 ENCOUNTER — Ambulatory Visit (HOSPITAL_COMMUNITY): Payer: Medicaid Other

## 2015-12-03 ENCOUNTER — Ambulatory Visit (HOSPITAL_COMMUNITY): Payer: Medicaid Other

## 2016-01-03 ENCOUNTER — Encounter (HOSPITAL_COMMUNITY)
Admission: EM | Disposition: A | Discharge status: Home Health | Payer: Self-pay | Source: Home / Self Care | Attending: Neurological Surgery

## 2016-01-03 ENCOUNTER — Inpatient Hospital Stay (HOSPITAL_COMMUNITY): Payer: BLUE CROSS/BLUE SHIELD | Admitting: Anesthesiology

## 2016-01-03 ENCOUNTER — Inpatient Hospital Stay (HOSPITAL_COMMUNITY): Payer: BLUE CROSS/BLUE SHIELD

## 2016-01-03 ENCOUNTER — Encounter (HOSPITAL_COMMUNITY): Payer: Self-pay

## 2016-01-03 ENCOUNTER — Emergency Department (HOSPITAL_COMMUNITY): Payer: BLUE CROSS/BLUE SHIELD

## 2016-01-03 ENCOUNTER — Inpatient Hospital Stay (HOSPITAL_COMMUNITY)
Admission: EM | Admit: 2016-01-03 | Discharge: 2016-01-17 | DRG: 020 | Disposition: A | Discharge status: Home Health | Payer: BLUE CROSS/BLUE SHIELD | Attending: Neurological Surgery | Admitting: Neurological Surgery

## 2016-01-03 DIAGNOSIS — G919 Hydrocephalus, unspecified: Secondary | ICD-10-CM | POA: Diagnosis present

## 2016-01-03 DIAGNOSIS — E878 Other disorders of electrolyte and fluid balance, not elsewhere classified: Secondary | ICD-10-CM | POA: Diagnosis not present

## 2016-01-03 DIAGNOSIS — R0902 Hypoxemia: Secondary | ICD-10-CM | POA: Insufficient documentation

## 2016-01-03 DIAGNOSIS — I471 Supraventricular tachycardia, unspecified: Secondary | ICD-10-CM | POA: Insufficient documentation

## 2016-01-03 DIAGNOSIS — R739 Hyperglycemia, unspecified: Secondary | ICD-10-CM | POA: Diagnosis present

## 2016-01-03 DIAGNOSIS — I615 Nontraumatic intracerebral hemorrhage, intraventricular: Secondary | ICD-10-CM | POA: Diagnosis present

## 2016-01-03 DIAGNOSIS — E872 Acidosis: Secondary | ICD-10-CM | POA: Diagnosis not present

## 2016-01-03 DIAGNOSIS — R1310 Dysphagia, unspecified: Secondary | ICD-10-CM | POA: Diagnosis not present

## 2016-01-03 DIAGNOSIS — J9600 Acute respiratory failure, unspecified whether with hypoxia or hypercapnia: Secondary | ICD-10-CM | POA: Diagnosis not present

## 2016-01-03 DIAGNOSIS — E876 Hypokalemia: Secondary | ICD-10-CM | POA: Diagnosis present

## 2016-01-03 DIAGNOSIS — I608 Other nontraumatic subarachnoid hemorrhage: Secondary | ICD-10-CM | POA: Diagnosis present

## 2016-01-03 DIAGNOSIS — Z452 Encounter for adjustment and management of vascular access device: Secondary | ICD-10-CM | POA: Insufficient documentation

## 2016-01-03 DIAGNOSIS — Z978 Presence of other specified devices: Secondary | ICD-10-CM

## 2016-01-03 DIAGNOSIS — R4182 Altered mental status, unspecified: Secondary | ICD-10-CM | POA: Diagnosis present

## 2016-01-03 DIAGNOSIS — I609 Nontraumatic subarachnoid hemorrhage, unspecified: Secondary | ICD-10-CM | POA: Insufficient documentation

## 2016-01-03 DIAGNOSIS — I480 Paroxysmal atrial fibrillation: Secondary | ICD-10-CM | POA: Diagnosis not present

## 2016-01-03 DIAGNOSIS — F17210 Nicotine dependence, cigarettes, uncomplicated: Secondary | ICD-10-CM | POA: Diagnosis present

## 2016-01-03 DIAGNOSIS — I248 Other forms of acute ischemic heart disease: Secondary | ICD-10-CM | POA: Diagnosis not present

## 2016-01-03 DIAGNOSIS — Z720 Tobacco use: Secondary | ICD-10-CM | POA: Insufficient documentation

## 2016-01-03 DIAGNOSIS — J9602 Acute respiratory failure with hypercapnia: Secondary | ICD-10-CM | POA: Insufficient documentation

## 2016-01-03 DIAGNOSIS — G9340 Encephalopathy, unspecified: Secondary | ICD-10-CM | POA: Diagnosis present

## 2016-01-03 DIAGNOSIS — I671 Cerebral aneurysm, nonruptured: Secondary | ICD-10-CM | POA: Diagnosis present

## 2016-01-03 DIAGNOSIS — I6032 Nontraumatic subarachnoid hemorrhage from left posterior communicating artery: Principal | ICD-10-CM | POA: Diagnosis present

## 2016-01-03 DIAGNOSIS — R092 Respiratory arrest: Secondary | ICD-10-CM | POA: Insufficient documentation

## 2016-01-03 DIAGNOSIS — E2740 Unspecified adrenocortical insufficiency: Secondary | ICD-10-CM | POA: Diagnosis not present

## 2016-01-03 DIAGNOSIS — D62 Acute posthemorrhagic anemia: Secondary | ICD-10-CM | POA: Diagnosis not present

## 2016-01-03 DIAGNOSIS — R7989 Other specified abnormal findings of blood chemistry: Secondary | ICD-10-CM | POA: Insufficient documentation

## 2016-01-03 DIAGNOSIS — D473 Essential (hemorrhagic) thrombocythemia: Secondary | ICD-10-CM | POA: Insufficient documentation

## 2016-01-03 DIAGNOSIS — I959 Hypotension, unspecified: Secondary | ICD-10-CM | POA: Diagnosis not present

## 2016-01-03 DIAGNOSIS — Z789 Other specified health status: Secondary | ICD-10-CM | POA: Diagnosis not present

## 2016-01-03 DIAGNOSIS — I10 Essential (primary) hypertension: Secondary | ICD-10-CM | POA: Insufficient documentation

## 2016-01-03 DIAGNOSIS — R0789 Other chest pain: Secondary | ICD-10-CM | POA: Diagnosis not present

## 2016-01-03 DIAGNOSIS — J9601 Acute respiratory failure with hypoxia: Secondary | ICD-10-CM | POA: Diagnosis not present

## 2016-01-03 DIAGNOSIS — R0682 Tachypnea, not elsewhere classified: Secondary | ICD-10-CM | POA: Insufficient documentation

## 2016-01-03 DIAGNOSIS — R579 Shock, unspecified: Secondary | ICD-10-CM

## 2016-01-03 DIAGNOSIS — Z0189 Encounter for other specified special examinations: Secondary | ICD-10-CM | POA: Diagnosis not present

## 2016-01-03 DIAGNOSIS — J988 Other specified respiratory disorders: Secondary | ICD-10-CM | POA: Diagnosis not present

## 2016-01-03 DIAGNOSIS — Z4659 Encounter for fitting and adjustment of other gastrointestinal appliance and device: Secondary | ICD-10-CM

## 2016-01-03 DIAGNOSIS — R0600 Dyspnea, unspecified: Secondary | ICD-10-CM | POA: Diagnosis not present

## 2016-01-03 DIAGNOSIS — D72829 Elevated white blood cell count, unspecified: Secondary | ICD-10-CM | POA: Insufficient documentation

## 2016-01-03 DIAGNOSIS — J9690 Respiratory failure, unspecified, unspecified whether with hypoxia or hypercapnia: Secondary | ICD-10-CM | POA: Insufficient documentation

## 2016-01-03 DIAGNOSIS — R079 Chest pain, unspecified: Secondary | ICD-10-CM | POA: Insufficient documentation

## 2016-01-03 DIAGNOSIS — D75839 Thrombocytosis, unspecified: Secondary | ICD-10-CM | POA: Insufficient documentation

## 2016-01-03 DIAGNOSIS — D6489 Other specified anemias: Secondary | ICD-10-CM | POA: Diagnosis present

## 2016-01-03 DIAGNOSIS — N289 Disorder of kidney and ureter, unspecified: Secondary | ICD-10-CM | POA: Insufficient documentation

## 2016-01-03 DIAGNOSIS — R001 Bradycardia, unspecified: Secondary | ICD-10-CM | POA: Diagnosis not present

## 2016-01-03 DIAGNOSIS — I4891 Unspecified atrial fibrillation: Secondary | ICD-10-CM | POA: Diagnosis present

## 2016-01-03 HISTORY — DX: Essential (primary) hypertension: I10

## 2016-01-03 HISTORY — DX: Supraventricular tachycardia: I47.1

## 2016-01-03 HISTORY — PX: RADIOLOGY WITH ANESTHESIA: SHX6223

## 2016-01-03 HISTORY — DX: Pre-excitation syndrome: I45.6

## 2016-01-03 HISTORY — DX: Supraventricular tachycardia, unspecified: I47.10

## 2016-01-03 LAB — CBC
HCT: 39.8 % (ref 36.0–46.0)
Hemoglobin: 12.6 g/dL (ref 12.0–15.0)
MCH: 27.2 pg (ref 26.0–34.0)
MCHC: 31.7 g/dL (ref 30.0–36.0)
MCV: 85.8 fL (ref 78.0–100.0)
Platelets: 224 10*3/uL (ref 150–400)
RBC: 4.64 MIL/uL (ref 3.87–5.11)
RDW: 14.2 % (ref 11.5–15.5)
WBC: 11.3 10*3/uL — ABNORMAL HIGH (ref 4.0–10.5)

## 2016-01-03 LAB — CBC WITH DIFFERENTIAL/PLATELET
Basophils Absolute: 0.1 10*3/uL (ref 0.0–0.1)
Basophils Relative: 1 %
Eosinophils Absolute: 0.2 10*3/uL (ref 0.0–0.7)
Eosinophils Relative: 2 %
HCT: 40.8 % (ref 36.0–46.0)
Hemoglobin: 13.2 g/dL (ref 12.0–15.0)
Lymphocytes Relative: 33 %
Lymphs Abs: 3.4 10*3/uL (ref 0.7–4.0)
MCH: 28.1 pg (ref 26.0–34.0)
MCHC: 32.4 g/dL (ref 30.0–36.0)
MCV: 86.8 fL (ref 78.0–100.0)
Monocytes Absolute: 0.9 10*3/uL (ref 0.1–1.0)
Monocytes Relative: 8 %
Neutro Abs: 5.8 10*3/uL (ref 1.7–7.7)
Neutrophils Relative %: 56 %
Platelets: 237 10*3/uL (ref 150–400)
RBC: 4.7 MIL/uL (ref 3.87–5.11)
RDW: 14.1 % (ref 11.5–15.5)
WBC: 10.3 10*3/uL (ref 4.0–10.5)

## 2016-01-03 LAB — URINALYSIS, ROUTINE W REFLEX MICROSCOPIC
Bilirubin Urine: NEGATIVE
Glucose, UA: NEGATIVE mg/dL
Ketones, ur: NEGATIVE mg/dL
Leukocytes, UA: NEGATIVE
Nitrite: NEGATIVE
Protein, ur: NEGATIVE mg/dL
Specific Gravity, Urine: 1.02 (ref 1.005–1.030)
pH: 6.5 (ref 5.0–8.0)

## 2016-01-03 LAB — COMPREHENSIVE METABOLIC PANEL
ALT: 39 U/L (ref 14–54)
ALT: 37 U/L (ref 14–54)
AST: 38 U/L (ref 15–41)
AST: 33 U/L (ref 15–41)
Albumin: 4 g/dL (ref 3.5–5.0)
Albumin: 3.5 g/dL (ref 3.5–5.0)
Alkaline Phosphatase: 69 U/L (ref 38–126)
Alkaline Phosphatase: 65 U/L (ref 38–126)
Anion gap: 12 (ref 5–15)
Anion gap: 7 (ref 5–15)
BUN: 14 mg/dL (ref 6–20)
BUN: 10 mg/dL (ref 6–20)
CO2: 20 mmol/L — ABNORMAL LOW (ref 22–32)
CO2: 24 mmol/L (ref 22–32)
Calcium: 9.6 mg/dL (ref 8.9–10.3)
Calcium: 9 mg/dL (ref 8.9–10.3)
Chloride: 108 mmol/L (ref 101–111)
Chloride: 107 mmol/L (ref 101–111)
Creatinine, Ser: 1.21 mg/dL — ABNORMAL HIGH (ref 0.44–1.00)
Creatinine, Ser: 1 mg/dL (ref 0.44–1.00)
GFR calc Af Amer: 57 mL/min — ABNORMAL LOW (ref >60)
GFR calc Af Amer: >60 mL/min (ref >60)
GFR calc non Af Amer: 49 mL/min — ABNORMAL LOW (ref >60)
GFR calc non Af Amer: >60 mL/min (ref >60)
Glucose, Bld: 143 mg/dL — ABNORMAL HIGH (ref 65–99)
Glucose, Bld: 139 mg/dL — ABNORMAL HIGH (ref 65–99)
Potassium: 3.2 mmol/L — ABNORMAL LOW (ref 3.5–5.1)
Potassium: 4.1 mmol/L (ref 3.5–5.1)
Sodium: 140 mmol/L (ref 135–145)
Sodium: 138 mmol/L (ref 135–145)
Total Bilirubin: 0.2 mg/dL — ABNORMAL LOW (ref 0.3–1.2)
Total Bilirubin: 0.3 mg/dL (ref 0.3–1.2)
Total Protein: 7.6 g/dL (ref 6.5–8.1)
Total Protein: 7 g/dL (ref 6.5–8.1)

## 2016-01-03 LAB — POCT I-STAT 7, (LYTES, BLD GAS, ICA,H+H)
Acid-base deficit: 6 mmol/L — ABNORMAL HIGH (ref 0.0–2.0)
Bicarbonate: 19.5 mEq/L — ABNORMAL LOW (ref 20.0–24.0)
Calcium, Ion: 1.12 mmol/L (ref 1.12–1.23)
HCT: 34 % — ABNORMAL LOW (ref 36.0–46.0)
Hemoglobin: 11.6 g/dL — ABNORMAL LOW (ref 12.0–15.0)
O2 Saturation: 100 %
Patient temperature: 35.5
Potassium: 4.1 mmol/L (ref 3.5–5.1)
Sodium: 146 mmol/L — ABNORMAL HIGH (ref 135–145)
TCO2: 21 mmol/L (ref 0–100)
pCO2 arterial: 35.2 mmHg (ref 35.0–45.0)
pH, Arterial: 7.345 — ABNORMAL LOW (ref 7.350–7.450)
pO2, Arterial: 250 mmHg — ABNORMAL HIGH (ref 80.0–100.0)

## 2016-01-03 LAB — ETHANOL: Alcohol, Ethyl (B): <5 mg/dL (ref <5)

## 2016-01-03 LAB — AMMONIA: Ammonia: 23 umol/L (ref 9–35)

## 2016-01-03 LAB — RAPID URINE DRUG SCREEN, HOSP PERFORMED
Amphetamines: NOT DETECTED
Barbiturates: NOT DETECTED
Benzodiazepines: NOT DETECTED
Cocaine: NOT DETECTED
Opiates: NOT DETECTED
Tetrahydrocannabinol: NOT DETECTED

## 2016-01-03 LAB — APTT: aPTT: 31 seconds (ref 24–37)

## 2016-01-03 LAB — I-STAT CG4 LACTIC ACID, ED: Lactic Acid, Venous: 3.81 mmol/L (ref 0.5–2.0)

## 2016-01-03 LAB — URINE MICROSCOPIC-ADD ON

## 2016-01-03 LAB — LACTIC ACID, PLASMA: Lactic Acid, Venous: 2 mmol/L (ref 0.5–2.0)

## 2016-01-03 LAB — TROPONIN I: Troponin I: <0.03 ng/mL (ref <0.031)

## 2016-01-03 LAB — MRSA PCR SCREENING: MRSA by PCR: NEGATIVE

## 2016-01-03 LAB — PROTIME-INR
INR: 1.02 (ref 0.00–1.49)
Prothrombin Time: 13.6 seconds (ref 11.6–15.2)

## 2016-01-03 SURGERY — RADIOLOGY WITH ANESTHESIA
Anesthesia: General

## 2016-01-03 MED ORDER — LABETALOL HCL 5 MG/ML IV SOLN
INTRAVENOUS | Status: DC | PRN
  Administered 2016-01-03: 5 mg via INTRAVENOUS

## 2016-01-03 MED ORDER — SUCCINYLCHOLINE CHLORIDE 20 MG/ML IJ SOLN
INTRAMUSCULAR | Status: DC | PRN
  Administered 2016-01-03: 100 mg via INTRAVENOUS

## 2016-01-03 MED ORDER — FENTANYL CITRATE (PF) 250 MCG/5ML IJ SOLN
INTRAMUSCULAR | Status: AC
  Filled 2016-01-03: qty 5

## 2016-01-03 MED ORDER — DEXAMETHASONE SODIUM PHOSPHATE 10 MG/ML IJ SOLN
10.0000 mg | Freq: Once | INTRAMUSCULAR | Status: AC
  Administered 2016-01-03: 10 mg via INTRAVENOUS
  Filled 2016-01-03: qty 1

## 2016-01-03 MED ORDER — SODIUM CHLORIDE 0.9 % IV SOLN
INTRAVENOUS | Status: DC
  Administered 2016-01-03 – 2016-01-06 (×7): via INTRAVENOUS
  Administered 2016-01-06: 100 mL/h via INTRAVENOUS
  Administered 2016-01-07 – 2016-01-12 (×6): via INTRAVENOUS
  Administered 2016-01-12: 1000 mL via INTRAVENOUS
  Administered 2016-01-12 – 2016-01-13 (×3): via INTRAVENOUS
  Administered 2016-01-14: 100 mL/h via INTRAVENOUS
  Administered 2016-01-14 – 2016-01-16 (×4): via INTRAVENOUS

## 2016-01-03 MED ORDER — NIMODIPINE 30 MG PO CAPS
60.0000 mg | ORAL_CAPSULE | ORAL | Status: DC
  Administered 2016-01-07 – 2016-01-17 (×57): 60 mg via ORAL
  Filled 2016-01-03 (×60): qty 2

## 2016-01-03 MED ORDER — FENTANYL CITRATE (PF) 100 MCG/2ML IJ SOLN
INTRAMUSCULAR | Status: AC
  Filled 2016-01-03: qty 4

## 2016-01-03 MED ORDER — PROPOFOL 1000 MG/100ML IV EMUL
5.0000 ug/kg/min | INTRAVENOUS | Status: DC
  Administered 2016-01-04: 30 ug/kg/min via INTRAVENOUS
  Administered 2016-01-04: 35 ug/kg/min via INTRAVENOUS
  Administered 2016-01-04 – 2016-01-05 (×5): 60 ug/kg/min via INTRAVENOUS
  Administered 2016-01-05: 30 ug/kg/min via INTRAVENOUS
  Filled 2016-01-03 (×6): qty 100
  Filled 2016-01-03: qty 200
  Filled 2016-01-03: qty 100

## 2016-01-03 MED ORDER — ALBUMIN HUMAN 25 % IV SOLN
25.0000 g | Freq: Four times a day (QID) | INTRAVENOUS | Status: DC
  Administered 2016-01-03 – 2016-01-11 (×28): 25 g via INTRAVENOUS
  Filled 2016-01-03: qty 100
  Filled 2016-01-03: qty 50
  Filled 2016-01-03 (×4): qty 100
  Filled 2016-01-03: qty 50
  Filled 2016-01-03 (×10): qty 100
  Filled 2016-01-03 (×3): qty 50
  Filled 2016-01-03 (×3): qty 100
  Filled 2016-01-03: qty 50
  Filled 2016-01-03 (×10): qty 100

## 2016-01-03 MED ORDER — DILTIAZEM HCL 100 MG IV SOLR
5.0000 mg/h | INTRAVENOUS | Status: DC
  Administered 2016-01-03: 5 mg/h via INTRAVENOUS
  Filled 2016-01-03 (×2): qty 100

## 2016-01-03 MED ORDER — ACETAMINOPHEN 325 MG PO TABS
650.0000 mg | ORAL_TABLET | ORAL | Status: DC | PRN
  Administered 2016-01-04 – 2016-01-09 (×8): 650 mg via ORAL
  Filled 2016-01-03 (×8): qty 2

## 2016-01-03 MED ORDER — MORPHINE SULFATE (PF) 4 MG/ML IV SOLN
4.0000 mg | Freq: Once | INTRAVENOUS | Status: AC
  Administered 2016-01-03: 4 mg via INTRAVENOUS
  Filled 2016-01-03: qty 1

## 2016-01-03 MED ORDER — SODIUM CHLORIDE 0.9 % IV SOLN
500.0000 mg | Freq: Two times a day (BID) | INTRAVENOUS | Status: DC
  Administered 2016-01-03 – 2016-01-10 (×15): 500 mg via INTRAVENOUS
  Filled 2016-01-03 (×18): qty 5

## 2016-01-03 MED ORDER — ESMOLOL HCL 100 MG/10ML IV SOLN
INTRAVENOUS | Status: DC | PRN
  Administered 2016-01-03 (×4): 20 mg via INTRAVENOUS

## 2016-01-03 MED ORDER — STROKE: EARLY STAGES OF RECOVERY BOOK
Freq: Once | Status: AC
  Administered 2016-01-03: 05:00:00
  Filled 2016-01-03: qty 1

## 2016-01-03 MED ORDER — LABETALOL HCL 5 MG/ML IV SOLN
10.0000 mg | INTRAVENOUS | Status: DC | PRN
  Administered 2016-01-04 (×2): 40 mg via INTRAVENOUS
  Administered 2016-01-04: 20 mg via INTRAVENOUS
  Administered 2016-01-05 (×2): 40 mg via INTRAVENOUS
  Administered 2016-01-08: 20 mg via INTRAVENOUS
  Filled 2016-01-03: qty 8
  Filled 2016-01-03: qty 4
  Filled 2016-01-03 (×2): qty 8
  Filled 2016-01-03: qty 4

## 2016-01-03 MED ORDER — MIDAZOLAM HCL 2 MG/2ML IJ SOLN
INTRAMUSCULAR | Status: AC
Start: 2016-01-03 — End: 2016-01-03
  Filled 2016-01-03: qty 6

## 2016-01-03 MED ORDER — ROCURONIUM BROMIDE 100 MG/10ML IV SOLN
INTRAVENOUS | Status: DC | PRN
  Administered 2016-01-03: 50 mg via INTRAVENOUS
  Administered 2016-01-03 (×2): 25 mg via INTRAVENOUS
  Administered 2016-01-03: 50 mg via INTRAVENOUS
  Administered 2016-01-03: 25 mg via INTRAVENOUS

## 2016-01-03 MED ORDER — LEVETIRACETAM IN NACL 1000 MG/100ML IV SOLN
1000.0000 mg | Freq: Once | INTRAVENOUS | Status: AC
  Administered 2016-01-03: 1000 mg via INTRAVENOUS
  Filled 2016-01-03: qty 100

## 2016-01-03 MED ORDER — FENTANYL CITRATE (PF) 100 MCG/2ML IJ SOLN
INTRAMUSCULAR | Status: DC | PRN
  Administered 2016-01-03 (×2): 50 ug via INTRAVENOUS
  Administered 2016-01-03: 200 ug via INTRAVENOUS

## 2016-01-03 MED ORDER — MIDAZOLAM HCL 2 MG/2ML IJ SOLN: 2.0000 mg | Freq: Once | INTRAMUSCULAR | Status: DC

## 2016-01-03 MED ORDER — DEXAMETHASONE SODIUM PHOSPHATE 4 MG/ML IJ SOLN
4.0000 mg | Freq: Four times a day (QID) | INTRAMUSCULAR | Status: DC
  Administered 2016-01-03 – 2016-01-13 (×39): 4 mg via INTRAVENOUS
  Filled 2016-01-03 (×38): qty 1

## 2016-01-03 MED ORDER — LIDOCAINE HCL (CARDIAC) 20 MG/ML IV SOLN
INTRAVENOUS | Status: DC | PRN
  Administered 2016-01-03: 50 mg via INTRAVENOUS

## 2016-01-03 MED ORDER — CETYLPYRIDINIUM CHLORIDE 0.05 % MT LIQD
7.0000 mL | Freq: Two times a day (BID) | OROMUCOSAL | Status: DC
Start: 2016-01-03 — End: 2016-01-06
  Administered 2016-01-03 – 2016-01-06 (×3): 7 mL via OROMUCOSAL

## 2016-01-03 MED ORDER — IOHEXOL 350 MG/ML SOLN
50.0000 mL | Freq: Once | INTRAVENOUS | Status: AC | PRN
  Administered 2016-01-03: 50 mL via INTRAVENOUS

## 2016-01-03 MED ORDER — PROPOFOL 10 MG/ML IV BOLUS
INTRAVENOUS | Status: DC | PRN
  Administered 2016-01-03: 70 mg via INTRAVENOUS
  Administered 2016-01-03: 130 mg via INTRAVENOUS

## 2016-01-03 MED ORDER — ONDANSETRON HCL 4 MG/2ML IJ SOLN: 4.0000 mg | Freq: Three times a day (TID) | INTRAMUSCULAR | Status: DC | PRN

## 2016-01-03 MED ORDER — MANNITOL 25 % IV SOLN
25.0000 g | Freq: Four times a day (QID) | INTRAVENOUS | Status: DC
  Administered 2016-01-03 – 2016-01-07 (×11): 25 g via INTRAVENOUS
  Administered 2016-01-07: 12.5 g via INTRAVENOUS
  Administered 2016-01-07 – 2016-01-11 (×9): 25 g via INTRAVENOUS
  Filled 2016-01-03 (×5): qty 100
  Filled 2016-01-03 (×3): qty 50
  Filled 2016-01-03 (×3): qty 100
  Filled 2016-01-03 (×2): qty 50
  Filled 2016-01-03 (×6): qty 100
  Filled 2016-01-03: qty 50
  Filled 2016-01-03 (×2): qty 100
  Filled 2016-01-03: qty 50
  Filled 2016-01-03: qty 100
  Filled 2016-01-03: qty 50
  Filled 2016-01-03: qty 100
  Filled 2016-01-03: qty 50

## 2016-01-03 MED ORDER — HYDROMORPHONE HCL 1 MG/ML IJ SOLN
1.0000 mg | INTRAMUSCULAR | Status: DC | PRN
  Administered 2016-01-03 – 2016-01-07 (×7): 1 mg via INTRAVENOUS
  Administered 2016-01-07: 2 mg via INTRAVENOUS
  Administered 2016-01-07: 1 mg via INTRAVENOUS
  Administered 2016-01-08: 2 mg via INTRAVENOUS
  Filled 2016-01-03 (×5): qty 1
  Filled 2016-01-03: qty 2
  Filled 2016-01-03 (×4): qty 1
  Filled 2016-01-03: qty 2
  Filled 2016-01-03: qty 1

## 2016-01-03 MED ORDER — NIMODIPINE 60 MG/20ML PO SOLN
60.0000 mg | ORAL | Status: DC
  Administered 2016-01-03 – 2016-01-14 (×24): 60 mg
  Filled 2016-01-03 (×22): qty 20

## 2016-01-03 MED ORDER — ONDANSETRON HCL 4 MG/2ML IJ SOLN
4.0000 mg | Freq: Once | INTRAMUSCULAR | Status: AC
  Administered 2016-01-03: 4 mg via INTRAVENOUS
  Filled 2016-01-03: qty 2

## 2016-01-03 MED ORDER — DILTIAZEM LOAD VIA INFUSION
20.0000 mg | Freq: Once | INTRAVENOUS | Status: AC
  Administered 2016-01-03: 20 mg via INTRAVENOUS
  Filled 2016-01-03: qty 20

## 2016-01-03 MED ORDER — PHENYLEPHRINE HCL 10 MG/ML IJ SOLN
10.0000 mg | INTRAVENOUS | Status: DC | PRN
  Administered 2016-01-03: 10 ug/min via INTRAVENOUS

## 2016-01-03 MED ORDER — LIDOCAINE HCL 1 % IJ SOLN
INTRAMUSCULAR | Status: AC
  Filled 2016-01-03: qty 20

## 2016-01-03 MED ORDER — SENNOSIDES-DOCUSATE SODIUM 8.6-50 MG PO TABS
1.0000 | ORAL_TABLET | Freq: Two times a day (BID) | ORAL | Status: DC
  Administered 2016-01-04 – 2016-01-05 (×2): 1 via ORAL
  Filled 2016-01-03 (×3): qty 1

## 2016-01-03 MED ORDER — CEFAZOLIN SODIUM-DEXTROSE 2-3 GM-% IV SOLR
2.0000 g | Freq: Once | INTRAVENOUS | Status: AC
  Administered 2016-01-03: 2 g via INTRAVENOUS
  Filled 2016-01-03: qty 50

## 2016-01-03 MED ORDER — ACETAMINOPHEN 650 MG RE SUPP
650.0000 mg | RECTAL | Status: DC | PRN
  Administered 2016-01-04 – 2016-01-05 (×2): 650 mg via RECTAL
  Filled 2016-01-03 (×2): qty 1

## 2016-01-03 MED ORDER — MIDAZOLAM HCL 2 MG/2ML IJ SOLN
4.0000 mg | Freq: Once | INTRAMUSCULAR | Status: AC
  Administered 2016-01-03: 4 mg via INTRAVENOUS

## 2016-01-03 MED ORDER — IOHEXOL 300 MG/ML  SOLN
400.0000 mL | Freq: Once | INTRAMUSCULAR | Status: AC | PRN
  Administered 2016-01-03: 150 mL via INTRAVENOUS

## 2016-01-03 MED ORDER — PANTOPRAZOLE SODIUM 40 MG IV SOLR
40.0000 mg | Freq: Every day | INTRAVENOUS | Status: DC
  Administered 2016-01-04 – 2016-01-09 (×6): 40 mg via INTRAVENOUS
  Filled 2016-01-03 (×6): qty 40

## 2016-01-03 MED ORDER — PROPOFOL 500 MG/50ML IV EMUL
INTRAVENOUS | Status: DC | PRN
  Administered 2016-01-03: 50 ug/kg/min via INTRAVENOUS

## 2016-01-03 NOTE — H&P (Addendum)
CC:  Chief Complaint  Patient presents with  . Altered Mental Status    HPI: [name] [name] is a 55 y.o. female who presents from [facility] ED with subarachnoid hemorrhage and altered mental status. The patient is unable to provide details about when her symptoms began due to her altered mentation.  PMH: History reviewed. No pertinent past medical history.  PSH: History reviewed. No pertinent past surgical history.  SH: Social History  Substance Use Topics  . Smoking status: Current Every Day Smoker -- 1.00 packs/day  . Smokeless tobacco: None  . Alcohol Use: No    MEDS: Prior to Admission medications   Medication Sig Start Date End Date Taking? Authorizing Provider  diphenhydrAMINE (BENADRYL) 25 MG tablet Take 1 tablet (25 mg total) by mouth every 4 (four) hours as needed for itching. 09/04/15   Tammy Triplett, PA-C  predniSONE (DELTASONE) 20 MG tablet Two tabs po qd x 5 days 09/04/15   Tammy Triplett, PA-C    ALLERGY: No Known Allergies  ROS: ROS  NEUROLOGIC EXAM: Somnolent, but arouses, sparse speech CN intact Follows commands throughout, no obvious weakness  IMAGING: CT Angio Head: Fisher 4 SAH.  Multiple intracranial aneurysms: right MCA, 2 left MCA, 2 right ICA, basilar apex, and left PICA.  IMPRESSION: - 55 y.o. female with HH3, Fisher 4 SAH.  PLAN: - Place EVD - PCCM to place CVL and a-line - SAH protocol - Dr. Kathyrn Sheriff to angio and coil PICA aneurysm this afternoon - Due to patient's mental status and hydrocephalus she needs an EVD.  There is no family available and the patient cannot provide consent.  Will proceed with two physician consent.

## 2016-01-03 NOTE — Procedures (Signed)
A timeout was performed. The right frontal area was clipped of hair then prepped and draped in the usual sterile fasion. The area of the planned incision and tunneled catheter was injected with lidocaine with epinephrine. An incision was made 10cm behind the glabella on the right mid pupillary line. The pericranium was scraped away. A burrhole was drilled at this location. The hole was cleaned of bone chips. The dura was sharply incised. A ventricular catheter was passed to 6.5cm at the skull and a "pop" was palpated when the ventricle was entered. The stylet was removed and there was brisk return of blood tinged CSF under pressure. The catheter was tunneled posteromedially. The incision was closed with staples and the catheter was secured at the exit site with a purse string suture. The catheter was secured to the scalp with staples. The catheter was connected to an external collection chamber.

## 2016-01-03 NOTE — Progress Notes (Signed)
I did review the CT and CTA findings with the patient's mother and significant other. I told them she has multiple aneurysms, but I feel the left PICA is the most likely source of hemorrhage. I discussed the angiogram and coiling procedure, with the possible need for PICA sacrifice to treat the aneurysm. Risks were discussed including stroke leading to paralysis/weakness/coma/death, bleeding, contrast reaction etc. All their questions were answered and consent was obtained.

## 2016-01-03 NOTE — Progress Notes (Signed)
eLink Physician-Brief Progress Note Patient Name: [name] [name] DOB: December 01, 1961 MRN: [id number]   Date of Service  01/03/2016  HPI/Events of Note  Returns from coiling of cerebral aneurysm intubated and mechanically ventilated. ABG on 50%/PRVC 12/TV 500/P5 = 7.345/35.2/250.0  eICU Interventions  Will order: 1. Propofol IV infusion. Titrate to RASS = -2 to -3. 2. Portable CXR now. 3. Ventilator settings: 50%/PRVC 12/TV 500/P 5.     Intervention Category Major Interventions: Respiratory failure - evaluation and management Minor Interventions: Agitation / anxiety - evaluation and management  Sommer,Steven Eugene 01/03/2016, 10:41 PM

## 2016-01-03 NOTE — Procedures (Signed)
Arterial Catheter Insertion Procedure Note [name] [name] [id number] November 08, 1961  Procedure: Insertion of Arterial Catheter  Indications: Blood pressure monitoring and Frequent blood sampling  Procedure Details Consent: Unable to obtain consent because of emergent medical necessity. and altered mental status Time Out: Verified patient identification, verified procedure, site/side was marked, verified correct patient position, special equipment/implants available, medications/allergies/relevent history reviewed, required imaging and test results available.  Performed  Maximum sterile technique was used including antiseptics, cap, gloves, gown, hand hygiene, mask and sheet. Skin prep: Chlorhexidine; local anesthetic administered 20 gauge catheter was inserted into right radial artery using the Seldinger technique.  Evaluation Blood flow good; BP tracing good. Complications: No apparent complications.   Vivien Rossetti 01/03/2016

## 2016-01-03 NOTE — ED Provider Notes (Signed)
CSN: [id number]     Arrival date & time 01/03/16  0108 History   First MD Initiated Contact with Patient 01/03/16 0118     Chief Complaint  Patient presents with  . Altered Mental Status     (Consider location/radiation/quality/duration/timing/severity/associated sxs/prior Treatment) Patient is a 55 y.o. female presenting with altered mental status. The history is provided by the patient and the EMS personnel. The history is limited by the condition of the patient (Altered mental status).  Altered Mental Status She was brought in because of altered mental status. EMS relates being called for patient being unresponsive. She has had waxing and waning level of consciousness with times being unresponsive and at times being awake and alert but not answering questions. She has been vomiting. She is able to answer some questions for me. She is complaining of a headache and is complaining of nausea and hurting everywhere. She is not able to give any other history.  History reviewed. No pertinent past medical history. History reviewed. No pertinent past surgical history. No family history on file. Social History  Substance Use Topics  . Smoking status: Current Every Day Smoker -- 1.00 packs/day  . Smokeless tobacco: None  . Alcohol Use: No   OB History    No data available     Review of Systems  Unable to perform ROS: Mental status change      Allergies  Review of patient's allergies indicates no known allergies.  Home Medications   Prior to Admission medications   Medication Sig Start Date End Date Taking? Authorizing Provider  diphenhydrAMINE (BENADRYL) 25 MG tablet Take 1 tablet (25 mg total) by mouth every 4 (four) hours as needed for itching. 09/04/15   Tammy Triplett, PA-C  predniSONE (DELTASONE) 20 MG tablet Two tabs po qd x 5 days 09/04/15   Tammy Triplett, PA-C   BP 144/102 mmHg  Pulse 121  Temp(Src) 98.5 F (36.9 C) (Rectal)  Resp 28  Ht 5\' 7"  (1.702 m)  Wt 185 lb  (83.915 kg)  BMI 28.97 kg/m2  SpO2 97% Physical Exam  Nursing note and vitals reviewed.  55 year old female, resting comfortably and in no acute distress. Vital signs arsignificant for tachycardia, tachypnea, hypertension. Oxygen saturation is 97%, which is normal. Head is normocephalic and atraumatic. PERRLA, EOMI. Oropharynx is clear. Neck is nontender and supple without adenopathy or JVD. there are no carotid bruits.  Back is nontender and there is no CVA tenderness. Lungs are clear without rales, wheezes, or rhonchi. Chest is nontender. HeaIs tachycardic and irregularthout murmur. Abdomen is soft, flat, nontender without masses or hepatosplenomegaly and peristalsis is normoactive. Extremities have no cyanosis or edema, full range of motion is present. Skin is warm and dry without rash. NeuShe is awake and does answer some questions. She is oriented to person but not place or time. Cranial nerves are grossly intact. She has generally decreased motor tone in arms and legs but grasp is equal at 3+. She does not cooperate for many requests for motor exam and will not raise her arms or legs or hold them up on command. ED Course  Procedures (including critical care time) Labs Review Results for orders placed or performed during the hospital encounter of 01/03/16  Comprehensive metabolic panel  Result Value Ref Range   Sodium 140 135 - 145 mmol/L   Potassium 3.2 (L) 3.5 - 5.1 mmol/L   Chloride 108 101 - 111 mmol/L   CO2 20 (L) 22 - 32 mmol/L  Glucose, Bld 143 (H) 65 - 99 mg/dL   BUN 14 6 - 20 mg/dL   Creatinine, Ser 1.21 (H) 0.44 - 1.00 mg/dL   Calcium 9.6 8.9 - 10.3 mg/dL   Total Protein 7.6 6.5 - 8.1 g/dL   Albumin 4.0 3.5 - 5.0 g/dL   AST 38 15 - 41 U/L   ALT 39 14 - 54 U/L   Alkaline Phosphatase 69 38 - 126 U/L   Total Bilirubin 0.2 (L) 0.3 - 1.2 mg/dL   GFR calc non Af Amer 49 (L) >60 mL/min   GFR calc Af Amer 57 (L) >60 mL/min   Anion gap 12 5 - 15  Ethanol  Result Value  Ref Range   Alcohol, Ethyl (B) <5 <5 mg/dL  CBC with Differential  Result Value Ref Range   WBC 10.3 4.0 - 10.5 K/uL   RBC 4.70 3.87 - 5.11 MIL/uL   Hemoglobin 13.2 12.0 - 15.0 g/dL   HCT 40.8 36.0 - 46.0 %   MCV 86.8 78.0 - 100.0 fL   MCH 28.1 26.0 - 34.0 pg   MCHC 32.4 30.0 - 36.0 g/dL   RDW 14.1 11.5 - 15.5 %   Platelets 237 150 - 400 K/uL   Neutrophils Relative % 56 %   Neutro Abs 5.8 1.7 - 7.7 K/uL   Lymphocytes Relative 33 %   Lymphs Abs 3.4 0.7 - 4.0 K/uL   Monocytes Relative 8 %   Monocytes Absolute 0.9 0.1 - 1.0 K/uL   Eosinophils Relative 2 %   Eosinophils Absolute 0.2 0.0 - 0.7 K/uL   Basophils Relative 1 %   Basophils Absolute 0.1 0.0 - 0.1 K/uL  Ammonia  Result Value Ref Range   Ammonia 23 9 - 35 umol/L  Troponin I  Result Value Ref Range   Troponin I <0.03 <0.031 ng/mL  I-Stat CG4 Lactic Acid, ED  Result Value Ref Range   Lactic Acid, Venous 3.81 (HH) 0.5 - 2.0 mmol/L   Imaging Review Ct Head Wo Contrast  01/03/2016  CLINICAL DATA:  Severe headache and altered mental status. EXAM: CT HEAD WITHOUT CONTRAST TECHNIQUE: Contiguous axial images were obtained from the base of the skull through the vertex without intravenous contrast. COMPARISON:  None. FINDINGS: Diffuse symmetric subarachnoid hemorrhage involving the basilar cisterns, fourth ventricle and bilateral lateral ventricles. There is sulcal effacement concerning for early cerebral edema. No definite hydrocephalus. No parenchymal or subdural component. No acute osseous abnormality. IMPRESSION: Diffuse subarachnoid and intraventricular hemorrhage in an aneurysmal pattern. Probable early cerebral edema. Critical Value/emergent results were called by telephone at the time of interpretation on 01/03/2016 at 1:55 am to Dr. Delora Fuel , who verbally acknowledged these results. Electronically Signed   By: Jeb Levering M.D.   On: 01/03/2016 01:57   I have personally reviewed and evaluated these images and lab results  as part of my medical decision-making. I have personally discussed these findings with the radiologist.   EKG Interpretation   Date/Time:  Thursday January 03 2016 01:17:02 EST Ventricular Rate:  120 PR Interval:    QRS Duration: 104 QT Interval:  333 QTC Calculation: 470 R Axis:   51 Text Interpretation:  Atrial fibrillation Probable anterolateral infarct,  old Non-specific intra-ventricular conduction delay No old tracing to  compare Confirmed by [facility]  MD, Daylin Eads ([id number]) on 01/03/2016 1:28:09 AM      CRITICAL CARE Performed by: [id number] Total critical care time: 90 minutes Critical care time was exclusive of separately billable procedures  and treating other patients. Critical care was necessary to treat or prevent imminent or life-threatening deterioration. Critical care was time spent personally by me on the following activities: development of treatment plan with patient and/or surrogate as well as nursing, discussions with consultants, evaluation of patient's response to treatment, examination of patient, obtaining history from patient or surrogate, ordering and performing treatments and interventions, ordering and review of laboratory studies, ordering and review of radiographic studies, pulse oximetry and re-evaluation of patient's condition. MDM   Final diagnoses:  Subarachnoid hemorrhage due to ruptured aneurysm (HCC)  Atrial fibrillation with rapid ventricular response (HCC)  Elevated lactic acid level  Renal insufficiency  Hypokalemia    Altered mental status in conjunction with atrial fibrillation. Old records are reviewed and she has no relevant past visits. Atrial fibrillation must be presumed to be new and she certainly would be at risk for having embolized from atrial fibrillation. She will be given diltiazem for rate control and will be sent for CT of head. She is given ondansetron for nausea. Screening labs are obtained.   CT scan shows evidence of subarachnoid  hemorrhage which is presumed to be from an aneurysm. Blood is seen in lateral ventricles as well as third and fourth ventricles. Anticoagulation is withheld because of subarachnoid hemorrhage. Case is discussed with Dr. Cyndy Freeze, who is on-call for neurosurgery, who accepts the patient in transfer and requests that she be given dexamethasone and levetiracetam. She is transferred to [facility]. Of note, following diltiazem, rate has decreased to the range of 90-110 and blood pressure remains at about [id number] systolic.   Delora Fuel, MD [id number] [phone]

## 2016-01-03 NOTE — Transfer of Care (Signed)
Immediate Anesthesia Transfer of Care Note  Patient: [name] [name]  Procedure(s) Performed: Procedure(s): RADIOLOGY WITH ANESTHESIA (N/A)  Patient Location: NICU  Anesthesia Type:General  Level of Consciousness: Patient remains intubated per anesthesia plan  Airway & Oxygen Therapy: Patient remains intubated per anesthesia plan and Patient placed on Ventilator (see vital sign flow sheet for setting)  Post-op Assessment: Report given to RN  Post vital signs: Reviewed and stable  Last Vitals:  Filed Vitals:   01/03/16 1500 01/03/16 1548  BP: 140/77   Pulse: 66   Temp: 37.7 C 37.7 C  Resp: 24     Complications: No apparent anesthesia complications

## 2016-01-03 NOTE — Brief Op Note (Signed)
PREOP DX: Subarachnoid Hemorrhage  POSTOP DX: Same  PROCEDURE: 1. Diagnostic cerebral angiogram 2. Attempted embolization of left PICA aneurysm  SURGEON: Jessly Lebeck  ANESTHESIA: GETA  EBL: Minimal  COMPLICATIONS: None immediate  CONDITION: stable to neuro ICU  FINDINGS: 1. Multiple intracranial aneurysms including bilateral MCA, RICA, Basilar apex, and left PICA.  2. Left PICA likely source of hemorrhage 3. Significant tortuosity involving the proximal PICA which precluded coiling of the aneurysm or sacrifice of the PICA.

## 2016-01-03 NOTE — Progress Notes (Signed)
[place] Progress Note Patient Name: [name] [name] DOB: [birth year] MRN: [id number]   Date of Service  01/03/2016  HPI/Events of Note  55 yo with SAH and IVH, now with new onset Afib.  Started on Diltiazem, BP controlled at this time  eICU Interventions  Possible aneurysmal bleed. Neuro Surgery following Cont with BP management.  Neuro Checks     Intervention Category Evaluation Type: New Patient Evaluation  Herbert Marken 01/03/2016, 6:37 AM

## 2016-01-03 NOTE — Anesthesia Preprocedure Evaluation (Signed)
Anesthesia Evaluation  Patient identified by MRN, date of birth, ID band Patient awake    Reviewed: Allergy & Precautions, H&P , NPO status , Patient's Chart, lab work & pertinent test results  Airway Mallampati: III  TM Distance: >3 FB Neck ROM: Full    Dental no notable dental hx. (+) Teeth Intact, Dental Advisory Given   Pulmonary Current Smoker,    Pulmonary exam normal breath sounds clear to auscultation       Cardiovascular hypertension,  Rhythm:Regular Rate:Normal     Neuro/Psych negative neurological ROS  negative psych ROS   GI/Hepatic negative GI ROS, Neg liver ROS,   Endo/Other  negative endocrine ROS  Renal/GU negative Renal ROS  negative genitourinary   Musculoskeletal   Abdominal   Peds  Hematology negative hematology ROS (+)   Anesthesia Other Findings   Reproductive/Obstetrics negative OB ROS                             Anesthesia Physical Anesthesia Plan  ASA: III  Anesthesia Plan: General   Post-op Pain Management:    Induction: Intravenous  Airway Management Planned: Oral ETT  Additional Equipment: Arterial line and CVP  Intra-op Plan:   Post-operative Plan: Post-operative intubation/ventilation  Informed Consent: I have reviewed the patients History and Physical, chart, labs and discussed the procedure including the risks, benefits and alternatives for the proposed anesthesia with the patient or authorized representative who has indicated his/her understanding and acceptance.   Dental advisory given  Plan Discussed with: CRNA  Anesthesia Plan Comments:         Anesthesia Quick Evaluation

## 2016-01-03 NOTE — Progress Notes (Signed)
RN advised RT that MD stated that the ETT was right main stem intubated and to pull back ETT 2 cm.  RT pulled ETT back to 23 cm at the Lip as the tube was originally 25 cm at the Lip.

## 2016-01-03 NOTE — Consult Note (Signed)
PULMONARY / CRITICAL CARE MEDICINE   Name: [name] [name] MRN: [id number] DOB: [birth year]    ADMISSION DATE:  01/03/2016 CONSULTATION DATE:  01/03/16  REFERRING MD:  Ditty  (neurosurgery)  CHIEF COMPLAINT:  SAH  HISTORY OF PRESENT ILLNESS:   55yo female with hx HTN, SVT s/p ablation (2010) and WPW initially presented to [facility] 1/12 with AMS, vomiting and headache.  CT scan revealed SAH and pt was tx to [facility] for admission by neurosurgery.  ER course c/b AFib with RVR.  She was started on diltiazem gtt, admitted to neuro ICU and ventriculostomy placed by Dr. Cyndy Freeze.  PCCM consulted for ICU medical management.   PAST MEDICAL HISTORY :  She  has a past medical history of SVT (supraventricular tachycardia) ([place]); Hypertension; and WPW (Wolff-Parkinson-White syndrome).  PAST SURGICAL HISTORY: She  has no past surgical history on file.  No Known Allergies  No current facility-administered medications on file prior to encounter.   Current Outpatient Prescriptions on File Prior to Encounter  Medication Sig  . diphenhydrAMINE (BENADRYL) 25 MG tablet Take 1 tablet (25 mg total) by mouth every 4 (four) hours as needed for itching.  . predniSONE (DELTASONE) 20 MG tablet Two tabs po qd x 5 days    FAMILY HISTORY:  Her has no family status information on file.   AMS - unable to discuss.  No notable family hx found in records.   SOCIAL HISTORY: She  reports that she has been smoking.  She does not have any smokeless tobacco history on file. She reports that she does not drink alcohol or use illicit drugs.  REVIEW OF SYSTEMS:   Unable, as per HPI obtained from records and RN.   SUBJECTIVE:  Lethargic post ventric placement ~30 mins ago -- required 4mg  IV versed for procedure.   VITAL SIGNS: BP 124/63 mmHg  Pulse 74  Temp(Src) 99.1 F (37.3 C) (Core (Comment))  Resp 17  Ht 5\' 7"  (1.702 m)  Wt 86.2 kg (190 lb 0.6 oz)  BMI 29.76 kg/m2  SpO2 100%  HEMODYNAMICS:    VENTILATOR  SETTINGS:    INTAKE / OUTPUT: I/O last 3 completed shifts: In: 173.3 [I.V.:173.3] Out: 200 [Urine:200]  PHYSICAL EXAMINATION: General:  Chronically ill appearing female, NAD  Neuro:  Lethargic, opens eyes to name and drifts quickly back to sleep, follows simple commands intermittently  HEENT:  Mm dry, no JVD Cardiovascular:  s1s2 rrr, NSR 70's  Lungs:  resps even, non labored on [place], diminished bases otherwise clear  Abdomen:  Round, soft, +bs  Musculoskeletal:  Warm and dry, scant BLE edema   LABS:  BMET  Recent Labs Lab 01/03/16 0124 01/03/16 0710  NA 140 138  K 3.2* 4.1  CL 108 107  CO2 20* 24  BUN 14 10  CREATININE 1.21* 1.00  GLUCOSE 143* 139*    Electrolytes  Recent Labs Lab 01/03/16 0124 01/03/16 0710  CALCIUM 9.6 9.0    CBC  Recent Labs Lab 01/03/16 0124 01/03/16 0710  WBC 10.3 11.3*  HGB 13.2 12.6  HCT 40.8 39.8  PLT 237 224    Coag's  Recent Labs Lab 01/03/16 0710  APTT 31  INR 1.02    Sepsis Markers  Recent Labs Lab 01/03/16 0145  LATICACIDVEN 3.81*    ABG No results for input(s): PHART, PCO2ART, PO2ART in the last 168 hours.  Liver Enzymes  Recent Labs Lab 01/03/16 0124 01/03/16 0710  AST 38 33  ALT 39 37  ALKPHOS 69  65  BILITOT 0.2* 0.3  ALBUMIN 4.0 3.5    Cardiac Enzymes  Recent Labs Lab 01/03/16 0124  TROPONINI <0.03    Glucose No results for input(s): GLUCAP in the last 168 hours.  Imaging Ct Angio Head W/cm &/or Wo Cm  01/03/2016  CLINICAL DATA:  Subarachnoid hemorrhage.  Altered mental status EXAM: CT ANGIOGRAPHY HEAD TECHNIQUE: Multidetector CT imaging of the head was performed using the standard protocol during bolus administration of intravenous contrast. Multiplanar CT image reconstructions and MIPs were obtained to evaluate the vascular anatomy. CONTRAST:  92mL OMNIPAQUE IOHEXOL 350 MG/ML SOLN COMPARISON:  Earlier today FINDINGS: CTA HEAD Anterior circulation: Tortuous distal left cervical ICA  without visible beading. Symmetric carotid arteries. There is bilateral posterior small anterior communicating arteries. No flow limiting stenosis or branch occlusion. Multiple aneurysms: 1. Right ICA at the posterior genu of the cavernous segment, 3.6 base to dome, approximately 2 mm neck aneurysm projecting anteriorly and superiorly. Sac is smooth. 2. Mid right cavernous segment aneurysm directed laterally measuring 2.2 mm. 3. Right M1 -M2 junction (second M1 branch point) aneurysm directed laterally, 2.6 mm with fairly narrow neck. Sac morphology is smooth. 4. Two left MCA bifurcation aneurysm is with shared neck incorporating the parent vessel measuring 2.6 mm. The more anterior aneurysm measures roughly 3.2 mm base to dome. The more posterior and superior aneurysm measures 6 mm. The larger aneurysm has mild lobulation. Posterior circulation: No stenosis. Symmetric vertebral arteries. Small distal right PICA. Symmetric superior cerebellar and posterior cerebral arteries. Posterior communicating arteries are present. Unfortunately, the left PICA is partially visualized and there is short cervical looping that is not visualized. Proximal but not ostial, superiorly directed the left PICA aneurysm has a 7.3 mm narrow neck. Based on hemorrhage pattern including intraventricular blood, and sac lobulation this is the favored source of acute rupture. There is a finger-like aneurysm directed superiorly and right lateral from the tilted basilar tip measuring 7.7 mm in length with a 3 mm neck. On coronal reformats there is a small outpouching between the left superior cerebellar and posterior cerebral arteries which likely developing aneurysm. Venous sinuses: Patent Anatomic variants: Negative. Delayed phase:No parenchymal enhancement. Stable lateral and third ventriculomegaly. Stable subarachnoid and intraventricular hemorrhage. No ischemic changes in the parenchyma. No prominent or early venous enhancement around focal  calcification in the posterior right tentorium. IMPRESSION: 1. At least 7 intracranial aneurysms. Based on hemorrhage and sac morphology a ~7 mm left PICA aneurysm is the favored source of intracranial hemorrhage. Due to coverage and cervical loop, the proximal left PICA is not visualized on this study. 2. 7.7 mm basilar tip aneurysm. 3. 2.6 mm right M1 -M2 aneurysm. 4. Two left MCA bifurcation aneurysms with shared neck, the larger measuring 6 mm. 5. Two right cavernous carotid aneurysms measuring 2.2 and 3.6 mm. 6. Suspect developing left SCA aneurysm. 7. Stable volume of subarachnoid and intraventricular hemorrhage. Stable lateral and third ventriculomegaly. 8. 3D reformats to follow. Electronically Signed   By: Monte Fantasia M.D.   On: 01/03/2016 07:03   Ct Head Wo Contrast  01/03/2016  CLINICAL DATA:  Severe headache and altered mental status. EXAM: CT HEAD WITHOUT CONTRAST TECHNIQUE: Contiguous axial images were obtained from the base of the skull through the vertex without intravenous contrast. COMPARISON:  None. FINDINGS: Diffuse symmetric subarachnoid hemorrhage involving the basilar cisterns, fourth ventricle and bilateral lateral ventricles. There is sulcal effacement concerning for early cerebral edema. No definite hydrocephalus. No parenchymal or subdural component. No acute  osseous abnormality. IMPRESSION: Diffuse subarachnoid and intraventricular hemorrhage in an aneurysmal pattern. Probable early cerebral edema. Critical Value/emergent results were called by telephone at the time of interpretation on 01/03/2016 at 1:55 am to Dr. Delora Fuel , who verbally acknowledged these results. Electronically Signed   By: Jeb Levering M.D.   On: 01/03/2016 01:57     STUDIES:  CT head 1/12>>> diffuse SAH and intraventricular hemorrhage CTA head 1/12>>> at least 7 intracranial aneurysms - 12mm L PICA aneurysm is favored source of ICH.    CULTURES:   ANTIBIOTICS: Ancef 1/12>>>  SIGNIFICANT  EVENTS:   LINES/TUBES: R rad aline 1/12>>> Ventriculostomy 1/12>>>  DISCUSSION: 55yo female with hx HTN, WPW syndrome admitted 1/12 with SAH in setting multiple aneurysms s/p ventriculostomy.    ASSESSMENT / PLAN:  NEUROLOGIC SAH  Multiple intracranial aneurysms  AMS P:   Plan for coiling of PICA aneurysm this pm per nsgy  EVD per nsgy  BP management  Mannitol per nsgy   PULMONARY Tobacco abuse  At risk for inability to protect airway  P:   Monitor mental status/ airway protection closely  Supplemental O2 as needed  Smoking cessation  Intermittent f/u CXR  Suspect she will remain intubated post neurosurgery procedure this pm   CARDIOVASCULAR AFib with RVR - resolved  Hx SVT s/p ablation  WPW syndrome Hx HTN   P:  nimotop  D/c cardizem gtt  PRN labetolol  Goal SBP <160 F/u lactate for clearance  No anticoagulation in setting ICH Will place CVL for CVP   RENAL Hypokalemia - mild  P:   F/u chem  Replete K PRN  Gentle NS   GASTROINTESTINAL No active issue  P:   NPO for now  NG tube for meds   HEMATOLOGIC SAH P:  SCD's  F/u CBC  INFECTIOUS Indwelling drain  P:   Ancef while ventriculostomy in   ENDOCRINE Hyperglycemia - no documented hx DM  P:   Monitor glucose on chem     FAMILY  - Updates:  No family available, RN attempting to contact next of kin    [name], NP 01/03/2016  9:02 AM Pager: [phone] or [phone]  Attending Note:  I have examined patient, reviewed labs, studies and notes. I have discussed the case with Shon Millet, and I agree with the data and plans as amended above. 55 yo woman admitted with SAH and A Fib not on anti-coag, admitted on dilt gtt now off (and back in NSR), s/p ventric this am. CT angio has now identified multiple aneurysms, PICA suspected to be culprit bleeder. On my eval she is sedated but rouses to voice and follows commands. She is in NSR. Airway protection appears to be adequate. We  wil plan to place CVC to allow titration of meds, BP regimen, nimotop. She is planned to go for aneurysm coiling today, will be intubated for that procedure. We will follow and manage her vent post-procedure.  Independent critical care time is 40 minutes.   [name], MD, PhD 01/03/2016, 12:26 PM [place] [facility] [phone] or if no answer [phone]

## 2016-01-03 NOTE — Care Management Note (Signed)
Case Management Note  Patient Details  Name: [name] [name] MRN: [id number] Date of Birth: November 28, 1961  Subjective/Objective:   Pt admitted on 01/03/16 with AMS, SAH.  PTA, pt independent of ADLS.                   Action/Plan: Will follow for discharge planning as pt progresses.    Expected Discharge Date:                  Expected Discharge Plan:  [place]  In-House Referral:     Discharge planning Services  CM Consult  Post Acute Care Choice:    Choice offered to:     DME Arranged:    DME Agency:     HH Arranged:    [place] Agency:     Status of Service:  In process, will continue to follow  Medicare Important Message Given:    Date Medicare IM Given:    Medicare IM give by:    Date Additional Medicare IM Given:    Additional Medicare Important Message give by:     If discussed at [place] of Stay Meetings, dates discussed:    Additional Comments:  Reinaldo Raddle, RN, BSN  Trauma/Neuro ICU Case Manager [phone]

## 2016-01-03 NOTE — Progress Notes (Signed)
Belongings that came with pt from [facility], taken to Security.

## 2016-01-03 NOTE — Progress Notes (Signed)
Utilization review completed. Remona Boom, RN, BSN. 

## 2016-01-03 NOTE — Clinical Social Work Note (Signed)
Clinical Education officer, museum received information from RN stating that patient transferred from [place] to [facility] and no family has been reached to notify of patient hospitalization.  CSW attempted to reach patient mother, [name], however number disconnected.  CSW contacted [employer] who states that patient has been reported as "missing" and police will contact family of patient whereabouts.  CSW notified Financial controller and Therapist, sports.  No further social work needs identified at this time.  Barbette Or, [place]

## 2016-01-03 NOTE — Progress Notes (Signed)
Pts presenting history reviewed. She is unable to provide history due to altered status, no family currently available. Does not have known medical history, current tobacco smoker. Unknown family history  EXAM:  BP 120/64 mmHg  Pulse 73  Temp(Src) 99.1 F (37.3 C) (Core (Comment))  Resp 15  Ht 5\' 7"  (1.702 m)  Wt 86.2 kg (190 lb 0.6 oz)  BMI 29.76 kg/m2  SpO2 100%  Somnolent but arousable CN grossly intact Follows simple commands all extremities, no asymmetry  IMAGING: CTA reviewed demonstrating multiple intracranial aneurysms. She has 2 cavernous RICA, small RMCA, LMCA, basilar apex, and left PICA aneurysms.  IMPRESSION:  55 y.o. female HH 3, Fisher 3/4 with multiple aneurysms. Given size and posterior circulation as well as blood pattern, PICA aneurysm seems most likely source of rupture.  PLAN: - Will plan on proceeding with diagnostic angiogram and possible coiling of left PICA aneurysm this afternoon. - No family currently available. I will speak to them this afternoon if available.

## 2016-01-03 NOTE — Procedures (Signed)
Central Venous Catheter Insertion Procedure Note [name] [name] [id number] 11-18-1961  Procedure: Insertion of Central Venous Catheter Indications: Assessment of intravascular volume and Drug and/or fluid administration  Procedure Details Consent: Unable to obtain consent because of altered level of consciousness. Time Out: Verified patient identification, verified procedure, site/side was marked, verified correct patient position, special equipment/implants available, medications/allergies/relevent history reviewed, required imaging and test results available.  Performed  Maximum sterile technique was used including antiseptics, cap, gloves, gown, hand hygiene, mask and sheet. Skin prep: Chlorhexidine; local anesthetic administered A antimicrobial bonded/coated triple lumen catheter was placed in the left internal jugular vein using the Seldinger technique.  Evaluation Blood flow good Complications: No apparent complications Patient did tolerate procedure well. Chest X-ray ordered to verify placement.  CXR: pending.   Performed using ultrasound guidance.  Wire visualized in vessel under ultrasound.   [name] Madrid, NP 01/03/2016  9:39 AM   [name] Apo, MD, PhD 01/03/2016, 12:25 PM [name] [phone] or if no answer [phone]

## 2016-01-03 NOTE — ED Notes (Signed)
Pt was brought in by ems after being called out for unresponsive patient.  Pt was awake and alert on ems arrival but would not answer questions or speak to medics.  Pt continues to vomit.  Pt is able to answer questions at this time, continues to ask where she is.

## 2016-01-04 ENCOUNTER — Inpatient Hospital Stay (HOSPITAL_COMMUNITY): Payer: BLUE CROSS/BLUE SHIELD | Admitting: Certified Registered Nurse Anesthetist

## 2016-01-04 ENCOUNTER — Encounter (HOSPITAL_COMMUNITY): Payer: Self-pay | Admitting: Radiology

## 2016-01-04 ENCOUNTER — Inpatient Hospital Stay (HOSPITAL_COMMUNITY): Payer: BLUE CROSS/BLUE SHIELD

## 2016-01-04 ENCOUNTER — Encounter (HOSPITAL_COMMUNITY)
Admission: EM | Disposition: A | Discharge status: Home Health | Payer: Self-pay | Source: Home / Self Care | Attending: Neurological Surgery

## 2016-01-04 DIAGNOSIS — J9601 Acute respiratory failure with hypoxia: Secondary | ICD-10-CM

## 2016-01-04 DIAGNOSIS — I609 Nontraumatic subarachnoid hemorrhage, unspecified: Secondary | ICD-10-CM | POA: Insufficient documentation

## 2016-01-04 DIAGNOSIS — R092 Respiratory arrest: Secondary | ICD-10-CM | POA: Insufficient documentation

## 2016-01-04 DIAGNOSIS — J9690 Respiratory failure, unspecified, unspecified whether with hypoxia or hypercapnia: Secondary | ICD-10-CM | POA: Insufficient documentation

## 2016-01-04 HISTORY — PX: CRANIOTOMY: SHX93

## 2016-01-04 LAB — BASIC METABOLIC PANEL
Anion gap: 7 (ref 5–15)
BUN: 10 mg/dL (ref 6–20)
CO2: 22 mmol/L (ref 22–32)
Calcium: 8.8 mg/dL — ABNORMAL LOW (ref 8.9–10.3)
Chloride: 114 mmol/L — ABNORMAL HIGH (ref 101–111)
Creatinine, Ser: 1.07 mg/dL — ABNORMAL HIGH (ref 0.44–1.00)
GFR calc Af Amer: >60 mL/min (ref >60)
GFR calc non Af Amer: 57 mL/min — ABNORMAL LOW (ref >60)
Glucose, Bld: 125 mg/dL — ABNORMAL HIGH (ref 65–99)
Potassium: 3.9 mmol/L (ref 3.5–5.1)
Sodium: 143 mmol/L (ref 135–145)

## 2016-01-04 LAB — GLUCOSE, CAPILLARY: Glucose-Capillary: 117 mg/dL — ABNORMAL HIGH (ref 65–99)

## 2016-01-04 LAB — PREPARE RBC (CROSSMATCH)

## 2016-01-04 LAB — PHOSPHORUS: Phosphorus: 3 mg/dL (ref 2.5–4.6)

## 2016-01-04 LAB — ABO/RH: ABO/RH(D): O POS

## 2016-01-04 LAB — MAGNESIUM: Magnesium: 1.9 mg/dL (ref 1.7–2.4)

## 2016-01-04 SURGERY — CRANIOTOMY, FOR VERTEBRAL/BASILAR ARTERY ANEURYSM REPAIR
Anesthesia: General | Site: Head

## 2016-01-04 MED ORDER — CHLORHEXIDINE GLUCONATE 0.12% ORAL RINSE (MEDLINE KIT)
15.0000 mL | Freq: Two times a day (BID) | OROMUCOSAL | Status: DC
  Administered 2016-01-04 – 2016-01-06 (×6): 15 mL via OROMUCOSAL

## 2016-01-04 MED ORDER — ONDANSETRON HCL 4 MG/2ML IJ SOLN
INTRAMUSCULAR | Status: DC | PRN
  Administered 2016-01-04: 4 mg via INTRAVENOUS

## 2016-01-04 MED ORDER — FUROSEMIDE 10 MG/ML IJ SOLN
10.0000 mg | Freq: Once | INTRAMUSCULAR | Status: AC
  Administered 2016-01-04: 10 mg via INTRAVENOUS

## 2016-01-04 MED ORDER — CEFAZOLIN (ANCEF) 1 G IV SOLR
2.0000 g | INTRAVENOUS | Status: DC
  Filled 2016-01-04: qty 2

## 2016-01-04 MED ORDER — SODIUM CHLORIDE 0.9 % IR SOLN
Status: DC | PRN
  Administered 2016-01-04: 18:00:00

## 2016-01-04 MED ORDER — BACITRACIN ZINC 500 UNIT/GM EX OINT
TOPICAL_OINTMENT | CUTANEOUS | Status: DC | PRN
  Administered 2016-01-04 (×2): 1 via TOPICAL

## 2016-01-04 MED ORDER — PHENYLEPHRINE 40 MCG/ML (10ML) SYRINGE FOR IV PUSH (FOR BLOOD PRESSURE SUPPORT)
PREFILLED_SYRINGE | INTRAVENOUS | Status: AC
  Filled 2016-01-04: qty 10

## 2016-01-04 MED ORDER — MIDAZOLAM HCL 5 MG/5ML IJ SOLN
INTRAMUSCULAR | Status: DC | PRN
  Administered 2016-01-04: 2 mg via INTRAVENOUS

## 2016-01-04 MED ORDER — MANNITOL 25 % IV SOLN: 25.0000 g | Freq: Once | INTRAVENOUS | Status: DC

## 2016-01-04 MED ORDER — ONDANSETRON HCL 4 MG/2ML IJ SOLN
INTRAMUSCULAR | Status: AC
  Filled 2016-01-04: qty 4

## 2016-01-04 MED ORDER — PHENYLEPHRINE HCL 10 MG/ML IJ SOLN
INTRAMUSCULAR | Status: AC
  Filled 2016-01-04: qty 1

## 2016-01-04 MED ORDER — HEMOSTATIC AGENTS (NO CHARGE) OPTIME
TOPICAL | Status: DC | PRN
  Administered 2016-01-04: 1 via TOPICAL

## 2016-01-04 MED ORDER — 0.9 % SODIUM CHLORIDE (POUR BTL) OPTIME
TOPICAL | Status: DC | PRN
  Administered 2016-01-04 (×2): 1000 mL

## 2016-01-04 MED ORDER — LIDOCAINE HCL (CARDIAC) 20 MG/ML IV SOLN
INTRAVENOUS | Status: AC
  Filled 2016-01-04: qty 15

## 2016-01-04 MED ORDER — SODIUM CHLORIDE 0.9 % IV SOLN
Freq: Once | INTRAVENOUS | Status: DC
Start: 2016-01-04 — End: 2016-01-05

## 2016-01-04 MED ORDER — LIDOCAINE-EPINEPHRINE 2 %-1:100000 IJ SOLN
INTRAMUSCULAR | Status: AC
  Filled 2016-01-04: qty 1

## 2016-01-04 MED ORDER — THROMBIN 5000 UNITS EX SOLR
OROMUCOSAL | Status: DC | PRN
  Administered 2016-01-04 (×2): via TOPICAL

## 2016-01-04 MED ORDER — FENTANYL CITRATE (PF) 250 MCG/5ML IJ SOLN
INTRAMUSCULAR | Status: AC
  Filled 2016-01-04: qty 5

## 2016-01-04 MED ORDER — GLYCOPYRROLATE 0.2 MG/ML IJ SOLN
INTRAMUSCULAR | Status: AC
  Filled 2016-01-04: qty 4

## 2016-01-04 MED ORDER — ROCURONIUM BROMIDE 50 MG/5ML IV SOLN
INTRAVENOUS | Status: AC
  Filled 2016-01-04: qty 1

## 2016-01-04 MED ORDER — MIDAZOLAM HCL 2 MG/2ML IJ SOLN
INTRAMUSCULAR | Status: AC
  Filled 2016-01-04: qty 2

## 2016-01-04 MED ORDER — ROCURONIUM BROMIDE 50 MG/5ML IV SOLN
INTRAVENOUS | Status: AC
  Filled 2016-01-04: qty 6

## 2016-01-04 MED ORDER — STERILE WATER FOR INJECTION IJ SOLN
INTRAMUSCULAR | Status: AC
  Filled 2016-01-04: qty 10

## 2016-01-04 MED ORDER — FUROSEMIDE 10 MG/ML IJ SOLN
INTRAMUSCULAR | Status: AC
  Filled 2016-01-04: qty 2

## 2016-01-04 MED ORDER — THROMBIN 20000 UNITS EX SOLR
CUTANEOUS | Status: DC | PRN
  Administered 2016-01-04: 18:00:00 via TOPICAL

## 2016-01-04 MED ORDER — NEOSTIGMINE METHYLSULFATE 10 MG/10ML IV SOLN
INTRAVENOUS | Status: AC
  Filled 2016-01-04: qty 1

## 2016-01-04 MED ORDER — STERILE WATER FOR INJECTION IJ SOLN
INTRAMUSCULAR | Status: AC
  Filled 2016-01-04: qty 20

## 2016-01-04 MED ORDER — PROPOFOL 10 MG/ML IV BOLUS
INTRAVENOUS | Status: DC | PRN
  Administered 2016-01-04: 150 mg via INTRAVENOUS
  Administered 2016-01-04: 50 mg via INTRAVENOUS

## 2016-01-04 MED ORDER — PROPOFOL 10 MG/ML IV BOLUS
INTRAVENOUS | Status: AC
  Filled 2016-01-04: qty 20

## 2016-01-04 MED ORDER — MANNITOL 25 % IV SOLN
50.0000 g | Freq: Once | INTRAVENOUS | Status: AC
  Administered 2016-01-04 (×4): 12.5 g via INTRAVENOUS

## 2016-01-04 MED ORDER — FENTANYL CITRATE (PF) 100 MCG/2ML IJ SOLN
INTRAMUSCULAR | Status: DC | PRN
  Administered 2016-01-04 (×3): 100 ug via INTRAVENOUS
  Administered 2016-01-04 (×3): 150 ug via INTRAVENOUS

## 2016-01-04 MED ORDER — SUCCINYLCHOLINE CHLORIDE 20 MG/ML IJ SOLN
INTRAMUSCULAR | Status: AC
  Filled 2016-01-04: qty 2

## 2016-01-04 MED ORDER — SODIUM CHLORIDE 0.9 % IV SOLN
Freq: Once | INTRAVENOUS | Status: AC
  Administered 2016-01-04: 15:00:00 via INTRAVENOUS

## 2016-01-04 MED ORDER — LIDOCAINE-EPINEPHRINE 0.5 %-1:200000 IJ SOLN
INTRAMUSCULAR | Status: DC | PRN
  Administered 2016-01-04: 15 mL

## 2016-01-04 MED ORDER — ANTISEPTIC ORAL RINSE SOLUTION (CORINZ)
7.0000 mL | OROMUCOSAL | Status: DC
  Administered 2016-01-04 – 2016-01-06 (×23): 7 mL via OROMUCOSAL

## 2016-01-04 MED ORDER — EPHEDRINE SULFATE 50 MG/ML IJ SOLN
INTRAMUSCULAR | Status: AC
  Filled 2016-01-04: qty 1

## 2016-01-04 MED ORDER — LIDOCAINE HCL (CARDIAC) 20 MG/ML IV SOLN
INTRAVENOUS | Status: AC
  Filled 2016-01-04: qty 5

## 2016-01-04 MED ORDER — CEFAZOLIN SODIUM-DEXTROSE 2-3 GM-% IV SOLR
2.0000 g | INTRAVENOUS | Status: AC
  Administered 2016-01-04: 2 g via INTRAVENOUS
  Filled 2016-01-04: qty 50

## 2016-01-04 MED ORDER — INDOCYANINE GREEN 25 MG IV SOLR
5.0000 mg | INTRAVENOUS | Status: DC
  Filled 2016-01-04: qty 25

## 2016-01-04 MED ORDER — EPHEDRINE SULFATE 50 MG/ML IJ SOLN
INTRAMUSCULAR | Status: AC
  Filled 2016-01-04: qty 2

## 2016-01-04 MED ORDER — SUCCINYLCHOLINE CHLORIDE 20 MG/ML IJ SOLN
INTRAMUSCULAR | Status: AC
  Filled 2016-01-04: qty 1

## 2016-01-04 MED ORDER — ARTIFICIAL TEARS OP OINT
TOPICAL_OINTMENT | OPHTHALMIC | Status: AC
  Filled 2016-01-04: qty 7

## 2016-01-04 MED ORDER — ROCURONIUM BROMIDE 100 MG/10ML IV SOLN
INTRAVENOUS | Status: DC | PRN
  Administered 2016-01-04 (×2): 50 mg via INTRAVENOUS

## 2016-01-04 MED ORDER — LIDOCAINE-EPINEPHRINE 2 %-1:100000 IJ SOLN
INTRAMUSCULAR | Status: DC | PRN
  Administered 2016-01-04: 15 mL

## 2016-01-04 SURGICAL SUPPLY — 92 items
APL SKNCLS STERI-STRIP NONHPOA (GAUZE/BANDAGES/DRESSINGS) ×2
APPLICATOR CHLORAPREP 3ML ORNG (MISCELLANEOUS) ×2 IMPLANT
BATTERY IQ STERILE (MISCELLANEOUS) ×1 IMPLANT
BENZOIN TINCTURE PRP APPL 2/3 (GAUZE/BANDAGES/DRESSINGS) ×2 IMPLANT
BLADE CLIPPER SURG (BLADE) ×2 IMPLANT
BLADE SURG 11 STRL SS (BLADE) ×2 IMPLANT
BLADE ULTRA TIP 2M (BLADE) ×2 IMPLANT
BNDG GAUZE ELAST 4 BULKY (GAUZE/BANDAGES/DRESSINGS) IMPLANT
BRUSH SCRUB EZ 1% IODOPHOR (MISCELLANEOUS) ×1 IMPLANT
BTRY SRG DRVR 1.5 IQ (MISCELLANEOUS) ×1
BUR ACORN 6.0 PRECISION (BURR) ×2 IMPLANT
BUR ADDG 1.1 (BURR) IMPLANT
BUR MATCHSTICK NEURO 3.0 LAGG (BURR) ×1 IMPLANT
BUR SPIRAL ROUTER 2.3 (BUR) ×1 IMPLANT
CANISTER SUCT 3000ML PPV (MISCELLANEOUS) ×4 IMPLANT
CATH ROBINSON RED A/P 14FR (CATHETERS) IMPLANT
CLIP ANEURY TI TEMP MINI STR 7 (Clip) ×1 IMPLANT
CLIP TI MEDIUM 6 (CLIP) ×1 IMPLANT
DRAIN SNY WOU 7FLT (WOUND CARE) IMPLANT
DRAPE NEUROLOGICAL W/INCISE (DRAPES) ×2 IMPLANT
DRAPE SHEET LG 3/4 BI-LAMINATE (DRAPES) ×4 IMPLANT
DRAPE SURG 17X23 STRL (DRAPES) IMPLANT
DRAPE WARM FLUID 44X44 (DRAPE) ×2 IMPLANT
DRSG MEPILEX BORDER 4X12 (GAUZE/BANDAGES/DRESSINGS) IMPLANT
DRSG MEPILEX BORDER 4X8 (GAUZE/BANDAGES/DRESSINGS) ×1 IMPLANT
DURAMATRIX ONLAY 2X2 (Neuro Prosthesis/Implant) ×1 IMPLANT
ELECT REM PT RETURN 9FT ADLT (ELECTROSURGICAL) ×2
ELECTRODE REM PT RTRN 9FT ADLT (ELECTROSURGICAL) ×1 IMPLANT
EVACUATOR 1/8 PVC DRAIN (DRAIN) IMPLANT
EVACUATOR SILICONE 100CC (DRAIN) IMPLANT
GAUZE SPONGE 4X4 12PLY STRL (GAUZE/BANDAGES/DRESSINGS) ×1 IMPLANT
GAUZE SPONGE 4X4 16PLY XRAY LF (GAUZE/BANDAGES/DRESSINGS) IMPLANT
GLOVE BIO SURGEON STRL SZ 6.5 (GLOVE) ×3 IMPLANT
GLOVE BIOGEL PI IND STRL 6.5 (GLOVE) IMPLANT
GLOVE BIOGEL PI IND STRL 7.5 (GLOVE) ×1 IMPLANT
GLOVE BIOGEL PI IND STRL 8 (GLOVE) IMPLANT
GLOVE BIOGEL PI INDICATOR 6.5 (GLOVE) ×2
GLOVE BIOGEL PI INDICATOR 7.5 (GLOVE) ×1
GLOVE BIOGEL PI INDICATOR 8 (GLOVE) ×2
GLOVE ECLIPSE 7.0 STRL STRAW (GLOVE) ×1 IMPLANT
GLOVE EXAM NITRILE LRG STRL (GLOVE) ×1 IMPLANT
GLOVE EXAM NITRILE MD LF STRL (GLOVE) IMPLANT
GLOVE EXAM NITRILE XL STR (GLOVE) IMPLANT
GLOVE EXAM NITRILE XS STR PU (GLOVE) IMPLANT
GLOVE SS BIOGEL STRL SZ 7 (GLOVE) ×2 IMPLANT
GLOVE SUPERSENSE BIOGEL SZ 7 (GLOVE) ×3
GOWN STRL REUS W/ TWL LRG LVL3 (GOWN DISPOSABLE) ×1 IMPLANT
GOWN STRL REUS W/ TWL XL LVL3 (GOWN DISPOSABLE) IMPLANT
GOWN STRL REUS W/TWL 2XL LVL3 (GOWN DISPOSABLE) ×1 IMPLANT
GOWN STRL REUS W/TWL LRG LVL3 (GOWN DISPOSABLE) ×6
GOWN STRL REUS W/TWL XL LVL3 (GOWN DISPOSABLE)
HEMOSTAT POWDER KIT SURGIFOAM (HEMOSTASIS) ×3 IMPLANT
HEMOSTAT SURGICEL 2X14 (HEMOSTASIS) ×1 IMPLANT
KIT BASIN OR (CUSTOM PROCEDURE TRAY) ×2 IMPLANT
KIT ROOM TURNOVER OR (KITS) ×2 IMPLANT
MARKER SKIN DUAL TIP RULER LAB (MISCELLANEOUS) ×1 IMPLANT
NEEDLE HYPO 25X1 1.5 SAFETY (NEEDLE) ×4 IMPLANT
NS IRRIG 1000ML POUR BTL (IV SOLUTION) ×4 IMPLANT
PACK CRANIOTOMY (CUSTOM PROCEDURE TRAY) ×2 IMPLANT
PATTIES SURGICAL .5 X.5 (GAUZE/BANDAGES/DRESSINGS) IMPLANT
PATTIES SURGICAL .5 X3 (DISPOSABLE) IMPLANT
PATTIES SURGICAL .5X1.5 (GAUZE/BANDAGES/DRESSINGS) ×1 IMPLANT
PATTIES SURGICAL 1X1 (DISPOSABLE) IMPLANT
PIN MAYFIELD SKULL DISP (PIN) ×2 IMPLANT
PLATE 1.5  2HOLE LNG NEURO (Plate) ×3 IMPLANT
PLATE 1.5 2HOLE LNG NEURO (Plate) IMPLANT
SCREW SELF DRILL HT 1.5/4MM (Screw) ×12 IMPLANT
SPONGE NEURO XRAY DETECT 1X3 (DISPOSABLE) IMPLANT
SPONGE SURGIFOAM ABS GEL 100 (HEMOSTASIS) ×2 IMPLANT
SPONGE SURGIFOAM ABS GEL 100C (HEMOSTASIS) ×2 IMPLANT
STAPLER VISISTAT 35W (STAPLE) ×2 IMPLANT
STOCKINETTE 6  STRL (DRAPES) ×1
STOCKINETTE 6 STRL (DRAPES) ×1 IMPLANT
STRIP SURGICAL 1 X 6 IN (GAUZE/BANDAGES/DRESSINGS) ×2 IMPLANT
STRIP SURGICAL 1/2 X 6 IN (GAUZE/BANDAGES/DRESSINGS) ×1 IMPLANT
STRIP SURGICAL 1/4 X 6 IN (GAUZE/BANDAGES/DRESSINGS) ×1 IMPLANT
SUT ETHILON 3 0 FSL (SUTURE) IMPLANT
SUT ETHILON 3 0 PS 1 (SUTURE) IMPLANT
SUT MNCRL AB 3-0 PS2 18 (SUTURE) ×2 IMPLANT
SUT NURALON 4 0 TR CR/8 (SUTURE) ×4 IMPLANT
SUT VIC AB 0 CT1 18XCR BRD8 (SUTURE) ×2 IMPLANT
SUT VIC AB 0 CT1 8-18 (SUTURE) ×4
SUT VIC AB 2-0 CT1 18 (SUTURE) ×4 IMPLANT
SUT VIC AB 3-0 SH 8-18 (SUTURE) ×3 IMPLANT
SYR 30ML LL (SYRINGE) ×2 IMPLANT
SYR CONTROL 10ML LL (SYRINGE) ×2 IMPLANT
TOWEL OR 17X24 6PK STRL BLUE (TOWEL DISPOSABLE) ×2 IMPLANT
TOWEL OR 17X26 10 PK STRL BLUE (TOWEL DISPOSABLE) ×2 IMPLANT
TRAY FOLEY W/METER SILVER 14FR (SET/KITS/TRAYS/PACK) ×1 IMPLANT
TUBE CONNECTING 12X1/4 (SUCTIONS) ×3 IMPLANT
UNDERPAD 30X30 INCONTINENT (UNDERPADS AND DIAPERS) ×1 IMPLANT
WATER STERILE IRR 1000ML POUR (IV SOLUTION) ×2 IMPLANT

## 2016-01-04 NOTE — Progress Notes (Signed)
Aneurysm not amenable to coiling to due vertebral artery anatomy ASS Intubated and sedated EVD draining Arouses and follows commands PERRL Stable To OR this afternoon for suboccipital craniotomy and PICA aneurysm clipping Spoke with patient's mother and explained risks and benefits of surgery, she agrees

## 2016-01-04 NOTE — Progress Notes (Signed)
Initial Nutrition Assessment   INTERVENTION:   If pt remains intubated recommend: Initiate Vital AF 1.2 @ 20 ml/hr via NG tube and increase by 10 ml every 4 hours to goal rate of 40 ml/hr.   30 ml Prostat BID.    Tube feeding regimen provides 1352 kcal, 102 grams of protein, and 778 ml of H2O.  TF regimen and propofol at current rate providing 1761 total kcal/day (99 % of kcal needs)  NUTRITION DIAGNOSIS:   Inadequate oral intake related to inability to eat as evidenced by NPO status.  GOAL:   Patient will meet greater than or equal to 90% of their needs  MONITOR:   Vent status, Labs, I & O's  REASON FOR ASSESSMENT:   Ventilator   ASSESSMENT:   55yo female with hx HTN, WPW syndrome admitted 1/12 with SAH in setting multiple aneurysms s/p ventriculostomy.   Patient is currently intubated on ventilator support MV: 7.8 L/min Temp (24hrs), Avg:99.2 F (37.3 C), Min:97.5 F (36.4 C), Max:100 F (37.8 C)  Propofol: 15.5 ml/hr provides 409 kcal per day Medications reviewed and include: senokot Labs reviewed NG tube in place Nutrition-Focused physical exam completed. Findings are no fat depletion, no muscle depletion, and no edema.  Pt discussed during ICU rounds and with RN.   Diet Order:  Diet NPO time specified  Skin:  Reviewed, no issues  Last BM:  unknown  Height:   Ht Readings from Last 1 Encounters:  01/03/16 5\' 7"  (1.702 m)   Weight:   Wt Readings from Last 1 Encounters:  01/03/16 190 lb 0.6 oz (86.2 kg)   Ideal Body Weight:  61.3 kg  BMI:  Body mass index is 29.76 kg/(m^2).  Estimated Nutritional Needs:   Kcal:  [id number]  Protein:  100-115 grams  Fluid:  > 1.7 L/day  EDUCATION NEEDS:   No education needs identified at this time  [place], [place], [place] Pager [phone] After Hours Pager

## 2016-01-04 NOTE — Op Note (Signed)
DIAGNOSTIC CEREBRAL ANGIOGRAM   ATTEMPTED COIL EMBOLIZATION OF LEFT POSTERIOR INFERIOR CEREBELLAR ARTERY  OPERATOR:   Dr. Consuella Lose, MD  HISTORY:   The patient is a 55 y.o. yo female presenting to the ED with altered mental status and headache. CT demonstrated SAH, and CTA demonstrated multiple intracranial aneurysm with the likely source of hemorrhage being the left PICA aneurysm. She therefore presents for diagnostic angiogram and possible coil embolization.  APPROACH:   The technical aspects of the procedure as well as its potential risks and benefits were reviewed with the patient's family. These risks included but were not limited bleeding, infection, allergic reaction, damage to organs/vital structures, stroke, non-diagnostic procedure, and the catastrophic outcomes of heart attack, coma, and death. With an understanding of these risks, informed consent was obtained and witnessed.    The patient was placed in the supine position on the angiography table and the skin of right groin prepped in the usual sterile fashion. The procedure was performed under local anesthesia (1%-solution of bicarbonate-bufferred Lidoacaine) and conscious sedation with Versed and fentanyl monitored by the in-suite nurse.    A 5- French sheath was introduced in the right common femoral artery using Seldinger technique.  A fluorophase sequence was used to document the sheath position.    HEPARIN: 0 Units total.   CONTRAST AGENT: 150cc, Omnipaque 300   FLUOROSCOPY TIME: 72.0 combined AP and lateral minutes    CATHETER(S) AND WIRE(S):    5-French JB-1 glidecatheter   0.035" glidewire    6Fr 80cm Cook Shuttle sheath 125cm Berenstein select catheter 058 Catalyst V guide catheter 072 Navien guide catheter 027 Marksman microcatheter XT-17 microcatheter SL-10 45-degree pre-shaped microcatheter Echelon 10 microcatheter Synchro2 STD microwire Synchro2 Soft microwire TransForm C 13mm x 22mm Balloon  catheter  VESSELS CATHETERIZED:   Right internal carotid   Left internal carotid   Right vertebral   Left vertebral   Basilar artery Right common femoral  VESSELS STUDIED:   Aortic arch Right internal carotid, head Right vertebral Left internal carotid, head Left vertebral Left vertebral, 3D rotation Left vertebral, final control Right femoral  PROCEDURAL NARRATIVE:   A 5-Fr JB-1 terumo glide catheter was advanced over a 0.035 glidewire into the aortic arch. The above vessels were then sequentially catheterized and cervical/cerebral angiograms taken. After review of images, the catheter was removed without incident.    The 5Fr sheath was exchanged for an 8 [place] sheath. The [place] shuttle sheath was then introduced over Careers information officer. The left subclavian artery was then catheterized, and the [facility] shuttle was advanced into the subclavian artery, proximal to the left vertebral artery origin. The Berenstein catheter was then removed. Initially, the 5 [place] guide catheter was introduced over the marksman microcatheter and microwire. The distal cervical vertebral artery was then catheterized, and the 5 [place] guide catheter was advanced into the distal cervical vertebral artery. The basilar artery was then catheterized with a microcatheter, and the 5 [place] guide was taken into the V3 segment. Under roadmap guidance, the marksman was removed and the XT 17 microcatheter was introduced. Multiple attempts were made to catheterized the left PICA which were all unsuccessful due to severe tortuosity including multiple care pain loops at the origin of the PICA. The XT 17 was removed, and attempts were then made with a preshaped SL 10 microcatheter and Synchro 2 soft microwire, which were also unsuccessful. Each attempt was unsuccessful due to herniation of the catheter into the vertebral artery. We therefore elected to  try placement of the balloon catheter as a buttress. We  therefore removed the microcatheter and the 5 [place] guide catheter. Again under roadmap guidance, the 6 [place] guide catheter was introduced with a double rotating hemostatic valve, and advanced into the distal cervical left vertebral artery over the marksman microcatheter. The echelon microcatheter and balloon catheter was then introduced in a biaxial fashion, and the guide catheter was advanced into the distal V3 segment. The balloon catheter was then advanced into the distal vertebral artery, just distal to the left PICA origin. Multiple attempts were then made to catheterized the aneurysm, with periodic balloon inflation to act as a buttress to try to prevent herniation into the vertebral artery. We were able to advance the microcatheter into the tonsillar medullary segment of the left PICA, but we were unable to catheterized the aneurysm because of continued catheter herniation. With the catheter proximal to the aneurysm, we did attempt to deploy coil into the aneurysm, but were unable to pass the coil into the aneurysm. At this point, the decision was made to abort the procedure as the tortuosity precluded sufficient catheterization of the aneurysm. Final control angiogram with the guide catheter in the left vertebral artery was taken, and the construct was removed without incident.  INTERPRETATION:   Aortic arch:    Type I, normal three vessel arch configuration. No significant ostial stenosis.    Right internal carotid: head:   Injection reveals the presence of a widely patent ICA, M1, and A1 segments and their branches. There is a small laterally projecting proximal right cavernous aneurysm measuring 2.2 x 2.43mm. There is likely also a small horizontal cavernous aneurysm although it is somewhat difficult to visualize. There is an aneurysm of the proximal superior division MCA in the horizontal segment measuring 2.6 x 3.3 mm with a relatively wide neck. The parenchymal and venous phases are normal.  The venous sinuses are widely patent.    Left internal carotid: head:   Injection reveals the presence of a widely patent ICA, A1, and M1 segments and their branches. There is a bilobed aneurysm at the left MCA bifurcation measuring 6.6 x 6.16mm in maximal dimension. The aneurysm projects laterally. The parenchymal and venous phases are normal. The venous sinuses are widely patent.    Right vertebral:   Cervical segments of the vertebral artery are unremarkable. Injection reveals the presence of a widely patent vertebral artery. This leads to a widely patent basilar artery that terminates in left P1. There is a superiorly and rightward projecting basilar apex aneurysm which measures 7.57mm tall by 2.71mm with a 2.20mm neck. There is also a small left SCA aneurysm. The parenchymal and venous phases are normal. The venous sinuses are widely patent.    Left vertebral:    The cervical segments are unremarkable. There is severe tortuosity of the PICA origin. There is an aneurysm of the PICA at the tonsillomedullary segment projecting superiorly. This measures 6.4 x 5.63mm. See basilar description above.    Left vertebral: 3D rotation 3D rotational angiogram:   Using a separate and distinct workstation at a distinct time from any previous procedures, CT angiographic images were reviewed. This included multiplanar and 3D reformatted images. These further delineate the above described PICA aneurysm and the tortuosity of the proximal PICA.  Left vertebral, final control: Injection reveals the presence of a widely patent vertebral artery. This leads to a widely patent basilar artery that terminates in left P1. The PICA and basilar aneurysms are unchanged. The left  PICA remains patent. There are no filling defect, and no evidence of stenosis or dissection. The parenchymal and venous phases are normal. The venous sinuses are widely patent.     Right femoral:    Normal vessel. No significant atherosclerotic  disease. Arterial sheath in adequate position.   DISPOSITION:  Upon completion of the study, the femoral sheath was removed and hemostasis obtained using a 7-Fr ExoSeal closure device. Good proximal and distal lower extremity pulses were documented upon achievement of hemostasis.    The procedure was well tolerated and no early complications were observed.       The patient was transferred back to the neuro intensive care unit for further care.    IMPRESSION:  1. Multiple intracranial aneurysms as described above, including the likely source of hemorrhage from a large left PICA aneurysm. There are also basilar, left SCA, right ICA, and bilateral MCA aneurysms. 2. Unsuccessful attempt at coil embolization of the left PICA aneurysm due to severe proximal tortuosity of the PICA.  The preliminary results of this procedure were shared with the patient's family.

## 2016-01-04 NOTE — Anesthesia Postprocedure Evaluation (Signed)
Anesthesia Post Note  Patient: [name] [name]  Procedure(s) Performed: Procedure(s) (LRB): Suboccipital Craniotomy and Cervical one Laminectomy for Clipping of Aneurysm (N/A)  Patient location during evaluation: SICU Anesthesia Type: General Level of consciousness: sedated Pain management: pain level controlled Vital Signs Assessment: post-procedure vital signs reviewed and stable Respiratory status: patient remains intubated per anesthesia plan Cardiovascular status: stable Anesthetic complications: no    Last Vitals:  Filed Vitals:   01/04/16 2111 01/04/16 2132  BP:    Pulse: 93 103  Temp:  37.8 C  Resp: 15 15    Last Pain:  Filed Vitals:   01/04/16 2136  PainSc: 8                  Jazelle Achey DAVID

## 2016-01-04 NOTE — Transfer of Care (Signed)
Immediate Anesthesia Transfer of Care Note  Patient: [name] [name]  Procedure(s) Performed: Procedure(s): Suboccipital Craniotomy and Cervical one Laminectomy for Clipping of Aneurysm (N/A)  Patient Location: SICU  Anesthesia Type:General  Level of Consciousness: Patient remains intubated per anesthesia plan  Airway & Oxygen Therapy: Patient placed on Ventilator (see vital sign flow sheet for setting)  Post-op Assessment: Report given to RN and Post -op Vital signs reviewed and stable  Post vital signs: Reviewed and stable  Last Vitals:  Filed Vitals:   01/04/16 1400 01/04/16 1500  BP: 118/70   Pulse: 81 79  Temp: 37.7 C 37.7 C  Resp: 14 16    Complications: No apparent anesthesia complications

## 2016-01-04 NOTE — Op Note (Signed)
01/03/2016 - 01/04/2016  6:31 PM  PATIENT:  [name] [name]  55 y.o. 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   POST-OPERATIVE DIAGNOSIS:  Same  PROCEDURE:  Suboccipital craniotomy and C1 laminectomy for clipping of posterior circulation aneurysm  SURGEON:  Aldean Ast, MD  ASSISTANTS: Consuella Lose, MD  ANESTHESIA:   General  DRAINS: None  SPECIMEN:  None  INDICATION FOR PROCEDURE: 55 year old African American woman with subarachnoid hemorrhage due to a ruptured left PICA aneurysm. She presented with a moderate to high-grade subarachnoid hemorrhage and underwent EVD placement on arrival. Yesterday Dr. Kathyrn Sheriff attempted to coil the ruptured left PICA aneurysm but due to the patient's abnormal vasculature he was unable to safely accomplish this. In order to protect her for rerupture I recommended taking her to the operating room today for suboccipital craniotomy and aneurysm clipping. I had a long discussion with the patient's mother and explained the risks and benefits of the operation and she wished for me to proceed.  PROCEDURE DETAILS: The patient arrived in the operating room intubated and was adequately anesthetized. Mayfield pins were placed and she was turned prone on chest rolls. Hair in the suboccipital region was clipped and then the skin was wiped down with alcohol. Lidocaine with epinephrine was infiltrated within the skin along the planned incision. It was then prepped and draped in the usual sterile fashion.  The skin was incised and monopolar cautery was used to dissect in the avascular plane to the occipital bone, and subperiosteal dissection was performed over the lateral aspects of C1. C1 laminectomy was then performed with a Leksell rongeur and Kerrison punches. A suboccipital craniotomy was then fashioned using a 3 mm matchstick  bur. This was elevated and removed from the operative field and placed on the back table in bacitracin saline.  The dura was sharply incised in the midline over the spinal cord and then extended cranially and to the left. There was a large amount of subarachnoid blood present. The arachnoid was sharply dissected and spinal fluid and blood was suction aspirated from the subarachnoid space. The left cerebellar tonsil was then gently retracted laterally. The PICA was identified and traced back to the aneurysm. The aneurysm was in fact much larger than appreciated on the angiographic studies because there was a large component of it that was thrombosed. The PICA was dissected off the aneurysm dome and the neck exposed. The neck of the aneurysm involving long segment of the PICA. It was obvious that there was no way to successfully cleared the neck the aneurysm without compromising the patency of the PICA. A curved clip was used to completely occlude the neck of the aneurysm. It was apparent that the distal PICA was compromised.  The field was irrigated. The dura over the cervical spinal cord was closed with Neurolons. The dura over the cerebellum was closed with an inlay of dural matrix and an onlay dural matrix. The bone flap was replaced and secured with titanium screws and plates. The wound was again irrigated with bacitracin saline. The wound was then closed in layers using interrupted Vicryl sutures. The skin was closed with a running and locked Monocryl suture. The wound was then treated with antibiotic ointment and a dressing was placed. Next  The patient was then returned to the supine position on the bed and the Mayfield pins were removed. She was transported back to the ICU in stable condition.  PATIENT DISPOSITION:  ICU   Delay start of Pharmacological VTE agent (>24hrs) due to surgical blood loss or risk of bleeding:  yes

## 2016-01-04 NOTE — Progress Notes (Signed)
Mannitol 25 g IV currently infusing. MD aware. Orders received for intra-op Mannitol, Lasix, Ancef, and to complete a type & screen & cross for 2 units.   [name] [name]

## 2016-01-04 NOTE — Anesthesia Postprocedure Evaluation (Signed)
Anesthesia Post Note  Patient: [name] [name]  Procedure(s) Performed: Procedure(s) (LRB): RADIOLOGY WITH ANESTHESIA (N/A)  Patient location during evaluation: ICU Anesthesia Type: General Level of consciousness: sedated Pain management: pain level controlled Vital Signs Assessment: post-procedure vital signs reviewed and stable Respiratory status: respiratory function stable and patient remains intubated per anesthesia plan Cardiovascular status: stable Postop Assessment: no signs of nausea or vomiting Anesthetic complications: no    Last Vitals:  Filed Vitals:   01/04/16 0500 01/04/16 0547  BP: 115/68   Pulse: 83 85  Temp: 37.5 C 37.5 C  Resp: 25 15    Last Pain:  Filed Vitals:   01/04/16 0547  PainSc: 8                  Zykira Matlack

## 2016-01-04 NOTE — Progress Notes (Signed)
Type and screen sent and consent obtained from mother if pt should need blood. Sheet in chart.

## 2016-01-04 NOTE — Anesthesia Preprocedure Evaluation (Signed)
Anesthesia Evaluation  Patient identified by MRN, date of birth, ID band Patient awake    Reviewed: Allergy & Precautions, H&P , NPO status , Patient's Chart, lab work & pertinent test results  Airway Mallampati: Intubated   Neck ROM: Full    Dental no notable dental hx. (+) Teeth Intact   Pulmonary Current Smoker,    Pulmonary exam normal        Cardiovascular hypertension, Normal cardiovascular exam     Neuro/Psych negative neurological ROS     GI/Hepatic negative GI ROS, Neg liver ROS,   Endo/Other  negative endocrine ROS  Renal/GU negative Renal ROS  negative genitourinary   Musculoskeletal   Abdominal Normal abdominal exam  (+)   Peds  Hematology negative hematology ROS (+)   Anesthesia Other Findings   Reproductive/Obstetrics negative OB ROS                             Anesthesia Physical  Anesthesia Plan  ASA: III  Anesthesia Plan: General   Post-op Pain Management:    Induction: Intravenous  Airway Management Planned: Oral ETT  Additional Equipment: Arterial line and CVP  Intra-op Plan:   Post-operative Plan: Post-operative intubation/ventilation  Informed Consent: I have reviewed the patients History and Physical, chart, labs and discussed the procedure including the risks, benefits and alternatives for the proposed anesthesia with the patient or authorized representative who has indicated his/her understanding and acceptance.     Plan Discussed with: CRNA and Surgeon  Anesthesia Plan Comments:         Anesthesia Quick Evaluation

## 2016-01-04 NOTE — Progress Notes (Signed)
PULMONARY / CRITICAL CARE MEDICINE   Name: [name] [name] MRN: [id number] DOB: 1961/03/08    ADMISSION DATE:  01/03/2016 CONSULTATION DATE:  01/03/16  REFERRING MD:  Ditty  (neurosurgery)  CHIEF COMPLAINT:  SAH  SUBJECTIVE:  Follows commands  VITAL SIGNS: BP 131/75 mmHg  Pulse 84  Temp(Src) 99.9 F (37.7 C) (Core (Comment))  Resp 16  Ht 5\' 7"  (1.702 m)  Wt 190 lb 0.6 oz (86.2 kg)  BMI 29.76 kg/m2  SpO2 100%  HEMODYNAMICS: CVP:  [5 mmHg-9 mmHg] 9 mmHg  VENTILATOR SETTINGS: Vent Mode:  [-] PRVC FiO2 (%):  [40 %-50 %] 40 % Set Rate:  [12 bmp] 12 bmp Vt Set:  [500 mL] 500 mL PEEP:  [5 cmH20] 5 cmH20 Plateau Pressure:  [17 cmH20-23 cmH20] 17 cmH20  INTAKE / OUTPUT: I/O last 3 completed shifts: In: 4185.2 [I.V.:3520.2; NG/GT:210; IV Piggyback:455] Out: [id number] [Urine:5265; Drains:86]  PHYSICAL EXAMINATION: General:  Chronically ill appearing female, NAD , intubated and sedated but arouses Neuro:  Opens eyes and follows commands HEENT:  Mm dry, no JVD Cardiovascular:  s1s2 rrr, NSR 70's  Lungs:  On vent , decreased bs left Abdomen:  Round, soft, +bs  Musculoskeletal:  Warm and dry, scant BLE edema   LABS:  BMET  Recent Labs Lab 01/03/16 0124 01/03/16 0710 01/03/16 2150 01/04/16 0546  NA 140 138 146* 143  K 3.2* 4.1 4.1 3.9  CL 108 107  --  114*  CO2 20* 24  --  22  BUN 14 10  --  10  CREATININE 1.21* 1.00  --  1.07*  GLUCOSE 143* 139*  --  125*    Electrolytes  Recent Labs Lab 01/03/16 0124 01/03/16 0710 01/04/16 0546  CALCIUM 9.6 9.0 8.8*  MG  --   --  1.9  PHOS  --   --  3.0    CBC  Recent Labs Lab 01/03/16 0124 01/03/16 0710 01/03/16 2150  WBC 10.3 11.3*  --   HGB 13.2 12.6 11.6*  HCT 40.8 39.8 34.0*  PLT 237 224  --     Coag's  Recent Labs Lab 01/03/16 0710  APTT 31  INR 1.02    Sepsis Markers  Recent Labs Lab 01/03/16 0145 01/03/16 1101  LATICACIDVEN 3.81* 2.0    ABG  Recent Labs Lab 01/03/16 2150  PHART  7.345*  PCO2ART 35.2  PO2ART 250.0*    Liver Enzymes  Recent Labs Lab 01/03/16 0124 01/03/16 0710  AST 38 33  ALT 39 37  ALKPHOS 69 65  BILITOT 0.2* 0.3  ALBUMIN 4.0 3.5    Cardiac Enzymes  Recent Labs Lab 01/03/16 0124  TROPONINI <0.03    Glucose No results for input(s): GLUCAP in the last 168 hours.  Imaging Dg Chest Port 1 View  01/04/2016  CLINICAL DATA:  Endotracheal tube present. EXAM: PORTABLE CHEST 1 VIEW COMPARISON:  Yesterday at 2249 hour FINDINGS: Endotracheal tube is 4.2 cm from the carina. Enteric tube is in place, tip below the diaphragm not included in the field of view. Tip of the left internal jugular catheter in the region of the distal brachiocephalic vein/ superior vena cava. There is improving left lung aeration with persistent hazy left lung opacity. The heart size is normal. No large pleural effusion. No pneumothorax. IMPRESSION: 1. Endotracheal tube 4.2 cm from the carina. 2. Improving left lung aeration with persistent hazy opacity throughout the left hemithorax, may reflect layering pleural effusion, atelectasis, or re-expansion pulmonary edema. Electronically Signed  By: Jeb Levering M.D.   On: 01/04/2016 03:35   Dg Chest Port 1 View  01/03/2016  CLINICAL DATA:  55 year old female status post intubation. EXAM: PORTABLE CHEST 1 VIEW COMPARISON:  Chest radiograph dated 01/03/2016 FINDINGS: There has been interval placement endotracheal tube with tip at the origin of the right mainstem bronchus. Recommend retraction and repositioning by approximately 5 cm. An enteric tube is partially visualized coursing the left upper abdomen. There is opacification of the left mid and lower lung field. There is silhouetting of the left cardiac border. Left IJ central line appears pulled back compared to the prior study. The tip of a central line is likely at the SVC/left innominate junction. IMPRESSION: Endotracheal tube with tip at the origin of the right mainstem  bronchus. Recommend retraction and repositioning by approximately 5 cm. Interval development of opacity at the left mid and lower lung field. Interval retraction of the left IJ central line with tip likely at the SVC/left innominate junction. No pneumothorax. Critical Value/emergent results were called by telephone at the time of interpretation on 01/03/2016 at 11:19 pm to nurse Milford who verbally acknowledged these results. Electronically Signed   By: Anner Crete M.D.   On: 01/03/2016 23:23   Dg Chest Portable 1 View  01/03/2016  CLINICAL DATA:  Encounter for central line placement. EXAM: PORTABLE CHEST - 1 VIEW; PORTABLE ABDOMEN - 1 VIEW COMPARISON:  Chest x-ray and rib radiographs 06/09/2013. FINDINGS: Low lung volumes exaggerate the heart size and interstitium. Mild pulmonary vascular congestion is evident. There are no effusions. No focal airspace consolidation is present. An NG tube courses off the inferior border the film. A a left IJ line is in place. The tip is at the level of the carina and directed laterally into the right hemi thorax. This may extend into the azygos vein. IMPRESSION: 1. Not left IJ line with its tip extended laterally at the level of the carina. This may be in the azygos vein. 2. NG tube courses off the inferior border the film. 3. Low lung volumes and mild pulmonary vascular congestion. Electronically Signed   By: San Morelle M.D.   On: 01/03/2016 11:03   Dg Abd Portable 1v  01/03/2016  CLINICAL DATA:  Encounter for central line placement. EXAM: PORTABLE CHEST - 1 VIEW; PORTABLE ABDOMEN - 1 VIEW COMPARISON:  Chest x-ray and rib radiographs 06/09/2013. FINDINGS: Low lung volumes exaggerate the heart size and interstitium. Mild pulmonary vascular congestion is evident. There are no effusions. No focal airspace consolidation is present. An NG tube courses off the inferior border the film. A a left IJ line is in place. The tip is at the level of the carina and directed  laterally into the right hemi thorax. This may extend into the azygos vein. IMPRESSION: 1. Not left IJ line with its tip extended laterally at the level of the carina. This may be in the azygos vein. 2. NG tube courses off the inferior border the film. 3. Low lung volumes and mild pulmonary vascular congestion. Electronically Signed   By: San Morelle M.D.   On: 01/03/2016 11:03     STUDIES:  CT head 1/12>>> diffuse SAH and intraventricular hemorrhage CTA head 1/12>>> at least 7 intracranial aneurysms - 51mm L PICA aneurysm is favored source of ICH.    CULTURES:  ANTIBIOTICS: Ancef 1/12>>>  SIGNIFICANT EVENTS: 1/12 Admit, to OR  LINES/TUBES: R rad aline 1/12 >>/13 Left brachial a line 1/12 >> Ventriculostomy 1/12 >> ETT 1/12 >>  DISCUSSION: 55yo female with hx HTN, WPW syndrome admitted 1/12 with SAH in setting multiple aneurysms s/p ventriculostomy.    ASSESSMENT / PLAN:  NEUROLOGIC A: SAH  Multiple intracranial aneurysms  AMS P:   Plan for coiling of PICA aneurysm this pm per nsgy  EVD per nsgy  BP management  Mannitol per nsgy   PULMONARY A: Tobacco abuse  At risk for inability to protect airway  P:   Monitor mental status/ airway protection closely  Supplemental O2 as needed  Smoking cessation  Intermittent f/u CXR  Leave intubated 1/13 as she is returning to OR 1/13 Left lung less aerated  CARDIOVASCULAR A: AFib with RVR - resolved  Hx SVT s/p ablation  WPW syndrome Hx HTN   P:  nimotop  PRN labetolol  Goal SBP <160 F/u lactate for clearance  No anticoagulation in setting ICH CVL for CVP   RENAL A: Hypokalemia - mild  P:   F/u chem  Replete K PRN  Gentle NS   GASTROINTESTINAL A: Nutrition P:   NPO for now  NG tube for meds   HEMATOLOGIC A: No acute issues P:  SCD's  F/u CBC  INFECTIOUS A: Post-op prophylaxis P:   Abx as needed per neurosurgry  ENDOCRINE A: Hyperglycemia - no documented hx DM  P:   Monitor  glucose on chem     FAMILY  - Updates:  No family available, RN attempting to contact next of kin    Richardson Landry Minor ACNP Maryanna Shape PCCM Pager [phone] till 3 pm If no answer page [phone] 01/04/2016, 10:35 AM  Reviewed above, examined.  She follows simple commands.  HR regular.  Rhonchi on Lt > Rt.  Abd soft.  1+ edema.  CXR with Lt sided interstitial edema pattern.  Assessment/plan: Acute respiratory failure with hypoxia. - continue vent support until neuro status stable - f/u CXR  SAH  - per neurosurgery  Hx of WPW - monitor hemodynamics, telemetry  CC time by me independent of APP time 32 minutes.  [name] Mires, MD [facility] Pulmonary/Critical Care 01/04/2016, 11:28 AM Pager:  [phone] After 3pm call: [phone]

## 2016-01-05 ENCOUNTER — Inpatient Hospital Stay (HOSPITAL_COMMUNITY): Payer: BLUE CROSS/BLUE SHIELD

## 2016-01-05 DIAGNOSIS — J988 Other specified respiratory disorders: Secondary | ICD-10-CM

## 2016-01-05 LAB — BASIC METABOLIC PANEL
Anion gap: 7 (ref 5–15)
BUN: 12 mg/dL (ref 6–20)
CO2: 23 mmol/L (ref 22–32)
Calcium: 8.9 mg/dL (ref 8.9–10.3)
Chloride: 113 mmol/L — ABNORMAL HIGH (ref 101–111)
Creatinine, Ser: 1 mg/dL (ref 0.44–1.00)
GFR calc Af Amer: >60 mL/min (ref >60)
GFR calc non Af Amer: >60 mL/min (ref >60)
Glucose, Bld: 119 mg/dL — ABNORMAL HIGH (ref 65–99)
Potassium: 4 mmol/L (ref 3.5–5.1)
Sodium: 143 mmol/L (ref 135–145)

## 2016-01-05 LAB — POCT I-STAT 7, (LYTES, BLD GAS, ICA,H+H)
Acid-base deficit: 5 mmol/L — ABNORMAL HIGH (ref 0.0–2.0)
Bicarbonate: 20.5 mEq/L (ref 20.0–24.0)
Calcium, Ion: 1.15 mmol/L (ref 1.12–1.23)
HCT: 30 % — ABNORMAL LOW (ref 36.0–46.0)
Hemoglobin: 10.2 g/dL — ABNORMAL LOW (ref 12.0–15.0)
O2 Saturation: 100 %
Patient temperature: 36.7
Potassium: 4.2 mmol/L (ref 3.5–5.1)
Sodium: 143 mmol/L (ref 135–145)
TCO2: 22 mmol/L (ref 0–100)
pCO2 arterial: 38.8 mmHg (ref 35.0–45.0)
pH, Arterial: 7.329 — ABNORMAL LOW (ref 7.350–7.450)
pO2, Arterial: 277 mmHg — ABNORMAL HIGH (ref 80.0–100.0)

## 2016-01-05 LAB — CBC
HCT: 28.6 % — ABNORMAL LOW (ref 36.0–46.0)
Hemoglobin: 9 g/dL — ABNORMAL LOW (ref 12.0–15.0)
MCH: 27.8 pg (ref 26.0–34.0)
MCHC: 31.5 g/dL (ref 30.0–36.0)
MCV: 88.3 fL (ref 78.0–100.0)
Platelets: 161 10*3/uL (ref 150–400)
RBC: 3.24 MIL/uL — ABNORMAL LOW (ref 3.87–5.11)
RDW: 14.9 % (ref 11.5–15.5)
WBC: 10.9 10*3/uL — ABNORMAL HIGH (ref 4.0–10.5)

## 2016-01-05 LAB — PHOSPHORUS: Phosphorus: 3.9 mg/dL (ref 2.5–4.6)

## 2016-01-05 LAB — MAGNESIUM: Magnesium: 2 mg/dL (ref 1.7–2.4)

## 2016-01-05 LAB — TRIGLYCERIDES: Triglycerides: 184 mg/dL — ABNORMAL HIGH (ref <150)

## 2016-01-05 MED ORDER — HYDRALAZINE HCL 20 MG/ML IJ SOLN
10.0000 mg | INTRAMUSCULAR | Status: DC | PRN
  Filled 2016-01-05: qty 1

## 2016-01-05 MED ORDER — VITAL HIGH PROTEIN PO LIQD
1000.0000 mL | ORAL | Status: DC
  Administered 2016-01-05 – 2016-01-06 (×3): 1000 mL

## 2016-01-05 NOTE — Progress Notes (Signed)
Subjective: Patient reports intubated  Objective: Vital signs in last 24 hours: Temp:  [99.3 F (37.4 C)-100.4 F (38 C)] 99.3 F (37.4 C) (01/14 0700) Pulse Rate:  [78-103] 91 (01/14 0700) Resp:  [12-26] 18 (01/14 0700) BP: (118-151)/(64-88) 122/67 mmHg (01/14 0700) SpO2:  [100 %] 100 % (01/14 0700) Arterial Line BP: (137-179)/(62-134) 156/69 mmHg (01/14 0700) FiO2 (%):  [40 %] 40 % (01/14 0337)  Intake/Output from previous day: 01/13 0701 - 01/14 0700 In: 4898.9 [I.V.:4278.9; NG/GT:160; IV Piggyback:460] Out: 5755 [Urine:5170; Drains:85; Blood:500] Intake/Output this shift:    ivc working well with pinkish bloody csf, inuibated, moves all 4 extremities.open eyes to commands. .   Lab Results:  Recent Labs  01/03/16 0710  01/04/16 1722 01/05/16 0348  WBC 11.3*  --   --  10.9*  HGB 12.6  < > 10.2* 9.0*  HCT 39.8  < > 30.0* 28.6*  PLT 224  --   --  161  < > = values in this interval not displayed. BMET  Recent Labs  01/04/16 0546 01/04/16 1722 01/05/16 0348  NA 143 143 143  K 3.9 4.2 4.0  CL 114*  --  113*  CO2 22  --  23  GLUCOSE 125*  --  119*  BUN 10  --  12  CREATININE 1.07*  --  1.00  CALCIUM 8.8*  --  8.9    Studies/Results: Dg Chest Port 1 View  01/04/2016  CLINICAL DATA:  Endotracheal tube present. EXAM: PORTABLE CHEST 1 VIEW COMPARISON:  Yesterday at 2249 hour FINDINGS: Endotracheal tube is 4.2 cm from the carina. Enteric tube is in place, tip below the diaphragm not included in the field of view. Tip of the left internal jugular catheter in the region of the distal brachiocephalic vein/ superior vena cava. There is improving left lung aeration with persistent hazy left lung opacity. The heart size is normal. No large pleural effusion. No pneumothorax. IMPRESSION: 1. Endotracheal tube 4.2 cm from the carina. 2. Improving left lung aeration with persistent hazy opacity throughout the left hemithorax, may reflect layering pleural effusion, atelectasis, or  re-expansion pulmonary edema. Electronically Signed   By: Jeb Levering [name].D.   On: 01/04/2016 03:35   Dg Chest Port 1 View  01/03/2016  CLINICAL DATA:  55 year old female status post intubation. EXAM: PORTABLE CHEST 1 VIEW COMPARISON:  Chest radiograph dated 01/03/2016 FINDINGS: There has been interval placement endotracheal tube with tip at the origin of the right mainstem bronchus. Recommend retraction and repositioning by approximately 5 cm. An enteric tube is partially visualized coursing the left upper abdomen. There is opacification of the left mid and lower lung field. There is silhouetting of the left cardiac border. Left IJ central line appears pulled back compared to the prior study. The tip of a central line is likely at the SVC/left innominate junction. IMPRESSION: Endotracheal tube with tip at the origin of the right mainstem bronchus. Recommend retraction and repositioning by approximately 5 cm. Interval development of opacity at the left mid and lower lung field. Interval retraction of the left IJ central line with tip likely at the SVC/left innominate junction. No pneumothorax. Critical Value/emergent results were called by telephone at the time of interpretation on 01/03/2016 at 11:19 pm to nurse Marinette who verbally acknowledged these results. Electronically Signed   By: Anner Crete [name].D.   On: 01/03/2016 23:23   Dg Chest Portable 1 View  01/03/2016  CLINICAL DATA:  Encounter for central line placement. EXAM: PORTABLE  CHEST - 1 VIEW; PORTABLE ABDOMEN - 1 VIEW COMPARISON:  Chest x-ray and rib radiographs 06/09/2013. FINDINGS: Low lung volumes exaggerate the heart size and interstitium. Mild pulmonary vascular congestion is evident. There are no effusions. No focal airspace consolidation is present. An NG tube courses off the inferior border the film. A a left IJ line is in place. The tip is at the level of the carina and directed laterally into the right hemi thorax. This may extend into  the azygos vein. IMPRESSION: 1. Not left IJ line with its tip extended laterally at the level of the carina. This may be in the azygos vein. 2. NG tube courses off the inferior border the film. 3. Low lung volumes and mild pulmonary vascular congestion. Electronically Signed   By: San Morelle [name].D.   On: 01/03/2016 11:03   Dg Abd Portable 1v  01/03/2016  CLINICAL DATA:  Encounter for central line placement. EXAM: PORTABLE CHEST - 1 VIEW; PORTABLE ABDOMEN - 1 VIEW COMPARISON:  Chest x-ray and rib radiographs 06/09/2013. FINDINGS: Low lung volumes exaggerate the heart size and interstitium. Mild pulmonary vascular congestion is evident. There are no effusions. No focal airspace consolidation is present. An NG tube courses off the inferior border the film. A a left IJ line is in place. The tip is at the level of the carina and directed laterally into the right hemi thorax. This may extend into the azygos vein. IMPRESSION: 1. Not left IJ line with its tip extended laterally at the level of the carina. This may be in the azygos vein. 2. NG tube courses off the inferior border the film. 3. Low lung volumes and mild pulmonary vascular congestion. Electronically Signed   By: San Morelle [name].D.   On: 01/03/2016 11:03    Assessment/Plan:  CCM to decide about extubation  LOS: 2 days  Continue r/x as present   [name] [name] 01/05/2016, 7:58 AM

## 2016-01-05 NOTE — Progress Notes (Signed)
Patient ID: [name] [name], female   DOB: 1961-05-11, 55 y.o.   MRN: [id number] Extubated, following commands. ivc working. CCM to help to control BP around 150

## 2016-01-05 NOTE — Progress Notes (Signed)
eLink Physician-Brief Progress Note Patient Name: [name] [name] DOB: August 15, 1961 MRN: [id number]   Date of Service  01/05/2016  HPI/Events of Note  SBP = 170. Goal SBP < 160. Already on Nimodipine and Labetalol PRN.   eICU Interventions  Will add Hydralazine 10 mg IV Q 4 hours PRN SBP > 160.     Intervention Category Intermediate Interventions: Hypertension - evaluation and management  Sommer,Steven Eugene 01/05/2016, 9:10 PM

## 2016-01-05 NOTE — Progress Notes (Signed)
PULMONARY / CRITICAL CARE MEDICINE   Name: [name] [name] MRN: [id number] DOB: December 06, 1961    ADMISSION DATE:  01/03/2016 CONSULTATION DATE:  01/03/16  REFERRING MD:  Ditty  (neurosurgery)  CHIEF COMPLAINT:  Altered mental status  SUBJECTIVE:  Tolerating pressure support.  VITAL SIGNS: BP 139/80 mmHg  Pulse 87  Temp(Src) 99.9 F (37.7 C) (Core (Comment))  Resp 20  Ht 5\' 7"  (1.702 m)  Wt 190 lb 0.6 oz (86.2 kg)  BMI 29.76 kg/m2  SpO2 100%  HEMODYNAMICS: CVP:  [8 mmHg-15 mmHg] 8 mmHg  VENTILATOR SETTINGS: Vent Mode:  [-] PSV FiO2 (%):  [40 %] 40 % Set Rate:  [12 bmp] 12 bmp Vt Set:  [500 mL] 500 mL PEEP:  [5 cmH20] 5 cmH20 Pressure Support:  [15 cmH20] 15 cmH20 Plateau Pressure:  [13 cmH20-16 cmH20] 16 cmH20  INTAKE / OUTPUT: I/O last 3 completed shifts: In: 7855.7 [I.V.:6825.7; NG/GT:370; IV Piggyback:660] Out: [id number] [Urine:9180; Drains:165; Blood:500]  PHYSICAL EXAMINATION: General: sedated Neuro: RASS -2 HEENT: ETT in place Cardiovascular:  regular Lungs: better air movement, no wheeze Abdomen: soft, non tender Musculoskeletal:  No edema  LABS:  BMET  Recent Labs Lab 01/03/16 0710  01/04/16 0546 01/04/16 1722 01/05/16 0348  NA 138  < > 143 143 143  K 4.1  < > 3.9 4.2 4.0  CL 107  --  114*  --  113*  CO2 24  --  22  --  23  BUN 10  --  10  --  12  CREATININE 1.00  --  1.07*  --  1.00  GLUCOSE 139*  --  125*  --  119*  < > = values in this interval not displayed.  Electrolytes  Recent Labs Lab 01/03/16 0710 01/04/16 0546 01/05/16 0348  CALCIUM 9.0 8.8* 8.9  MG  --  1.9 2.0  PHOS  --  3.0 3.9    CBC  Recent Labs Lab 01/03/16 0124 01/03/16 0710 01/03/16 2150 01/04/16 1722 01/05/16 0348  WBC 10.3 11.3*  --   --  10.9*  HGB 13.2 12.6 11.6* 10.2* 9.0*  HCT 40.8 39.8 34.0* 30.0* 28.6*  PLT 237 224  --   --  161    Coag's  Recent Labs Lab 01/03/16 0710  APTT 31  INR 1.02    Sepsis Markers  Recent Labs Lab 01/03/16 0145  01/03/16 1101  LATICACIDVEN 3.81* 2.0    ABG  Recent Labs Lab 01/03/16 2150 01/04/16 1722  PHART 7.345* 7.329*  PCO2ART 35.2 38.8  PO2ART 250.0* 277.0*    Liver Enzymes  Recent Labs Lab 01/03/16 0124 01/03/16 0710  AST 38 33  ALT 39 37  ALKPHOS 69 65  BILITOT 0.2* 0.3  ALBUMIN 4.0 3.5    Cardiac Enzymes  Recent Labs Lab 01/03/16 0124  TROPONINI <0.03    Glucose  Recent Labs Lab 01/03/16 0414  GLUCAP 117*    Imaging Dg Chest Port 1 View  01/05/2016  CLINICAL DATA:  Two followup exam. Respiratory failure. Intubated patient. EXAM: PORTABLE CHEST 1 VIEW COMPARISON:  01/04/2016 FINDINGS: Endotracheal tube and nasogastric tube are stable and well positioned. Left internal jugular central venous line tip projects at the confluence of the left brachiocephalic vein and superior vena cava, also stable. There is clearing of the left lung when compared to the previous day's study. Mild increased opacities noted at the right lung base and there is mild opacity at the left lung base most likely atelectasis. No pulmonary edema.  Small effusions are suspected. No pneumothorax. No IMPRESSION: 1. Improved lung aeration when compared to the previous day's study with clearing of much of the airspace opacity noted in the left lung. 2. Lung base opacity is noted, increased on the right from the previous day's study, most likely due to atelectasis. 3. Support apparatus is stable. Electronically Signed   By: Lajean Manes M.D.   On: 01/05/2016 08:25     STUDIES:  1/12 CT head >> SAH with IVH 1/12 CTA head >> multiple aneurysms with 7 mm Lt PICA aneurysm as source of SAH  CULTURES:  ANTIBIOTICS: Ancef 1/12>>>  SIGNIFICANT EVENTS: 1/12 Admit, to OR 1/13 To OR >> suboccipital craniotomy, C1 laminectomy, clipping of posterior circulation aneurysm  LINES/TUBES: 1/12 Lt brachial aline >> 1/12 Lt IJ CVL >> 1/12 ETT >> 1/12 IVD >>  DISCUSSION: 55 yo female smoker with altered  mental status from [facility] and IVH.  Found to have cerebral aneurysm.  She has hx of HTN, WPW.  ASSESSMENT / PLAN:  NEUROLOGIC A: Acute encephalopathy 2nd to [facility], IVH from posterior circulation aneurysm s/p clipping. P:   Post op care, albumin, mannitol, decadron, AED's, IVD per neurology Continue nimotop RASS goal 0  PULMONARY A: Compromised airway. Hx of tobacco abuse. P:   Pressure support wean as tolerated  F/u CXR  CARDIOVASCULAR A: A fib with RVR >> transient on admission. Hx of WPW, HTN. P:  Goal SBP < 160 per neurosurgery PRN labetolol   RENAL A: Hypokalemia. P:   F/u and replace electrolytes as needed  GASTROINTESTINAL A: Nutrition. P:   Tube feeds if unable to extubate soon Protonix for SUP   HEMATOLOGIC A: Anemia of critical illness. P:  F/u CBC SCD's for DVT prevention  INFECTIOUS A: No evidence for infection. P:   Monitor clinically  ENDOCRINE A: Hyperglycemia. P:   Monitor glucose on chem   Updated pt's family at bedside.  CC time 33 minutes.  Chesley Mires, MD [facility] Pulmonary/Critical Care 01/05/2016, 1:22 PM Pager:  [phone] After 3pm call: [phone]

## 2016-01-06 ENCOUNTER — Inpatient Hospital Stay (HOSPITAL_COMMUNITY): Payer: BLUE CROSS/BLUE SHIELD

## 2016-01-06 LAB — BASIC METABOLIC PANEL
Anion gap: 6 (ref 5–15)
BUN: 17 mg/dL (ref 6–20)
CO2: 21 mmol/L — ABNORMAL LOW (ref 22–32)
Calcium: 8.9 mg/dL (ref 8.9–10.3)
Chloride: 113 mmol/L — ABNORMAL HIGH (ref 101–111)
Creatinine, Ser: 0.98 mg/dL (ref 0.44–1.00)
GFR calc Af Amer: >60 mL/min (ref >60)
GFR calc non Af Amer: >60 mL/min (ref >60)
Glucose, Bld: 140 mg/dL — ABNORMAL HIGH (ref 65–99)
Potassium: 3.8 mmol/L (ref 3.5–5.1)
Sodium: 140 mmol/L (ref 135–145)

## 2016-01-06 LAB — GLUCOSE, CAPILLARY
Glucose-Capillary: 143 mg/dL — ABNORMAL HIGH (ref 65–99)
Glucose-Capillary: 137 mg/dL — ABNORMAL HIGH (ref 65–99)
Glucose-Capillary: 112 mg/dL — ABNORMAL HIGH (ref 65–99)
Glucose-Capillary: 112 mg/dL — ABNORMAL HIGH (ref 65–99)
Glucose-Capillary: 105 mg/dL — ABNORMAL HIGH (ref 65–99)

## 2016-01-06 LAB — CBC
HCT: 23.8 % — ABNORMAL LOW (ref 36.0–46.0)
Hemoglobin: 7.7 g/dL — ABNORMAL LOW (ref 12.0–15.0)
MCH: 28.1 pg (ref 26.0–34.0)
MCHC: 32.4 g/dL (ref 30.0–36.0)
MCV: 86.9 fL (ref 78.0–100.0)
Platelets: 140 10*3/uL — ABNORMAL LOW (ref 150–400)
RBC: 2.74 MIL/uL — ABNORMAL LOW (ref 3.87–5.11)
RDW: 14.7 % (ref 11.5–15.5)
WBC: 10.8 10*3/uL — ABNORMAL HIGH (ref 4.0–10.5)

## 2016-01-06 LAB — HEMOGLOBIN AND HEMATOCRIT, BLOOD
HCT: 25 % — ABNORMAL LOW (ref 36.0–46.0)
Hemoglobin: 8 g/dL — ABNORMAL LOW (ref 12.0–15.0)

## 2016-01-06 MED ORDER — RESOURCE THICKENUP CLEAR PO POWD
ORAL | Status: DC | PRN
  Filled 2016-01-06: qty 125

## 2016-01-06 NOTE — Evaluation (Signed)
Clinical/Bedside Swallow Evaluation Patient Details  Name: [name] [name] MRN: [id number] Date of Birth: [birth year]  Today's Date: 01/06/2016 Time: SLP Start Time (ACUTE ONLY): 1110 SLP Stop Time (ACUTE ONLY): 1127 SLP Time Calculation (min) (ACUTE ONLY): 17 min  Past Medical History:  Past Medical History  Diagnosis Date  . SVT (supraventricular tachycardia) ([place])   . Hypertension   . WPW (Wolff-Parkinson-White syndrome)    Past Surgical History:  Past Surgical History  Procedure Laterality Date  . Radiology with anesthesia N/A 01/03/2016    Procedure: RADIOLOGY WITH ANESTHESIA;  Surgeon: Medication Radiologist, MD;  Location: [facility];  Service: Radiology;  Laterality: N/A;   HPI:  55 yo female smoker with altered mental status from [facility] and IVH. Found to have cerebral aneurysm, intubated 1/12, 1/13 suboccipital craniotomy, C1 laminectomy, clipping of posterior circulation aneurysm, extubated 1/14. Has IVD.    Assessment / Plan / Recommendation Clinical Impression  Pt demosntrates very minimal signs of airway penetration with large sips of thin liquids via straw. Suspect NG tube may impact normal swallow and function will improve upon removal. Pts vocal Jobe Gibbon is clear with effort (when speaking on the phone with family). Pt tolerated regular solids and nectar thick liquids very well. Will initiate a dys 3 (mechanical soft) diet with nectar thick liquids and follow for tolerance.     Aspiration Risk  Mild aspiration risk    Diet Recommendation Dysphagia 3 (Mech soft);Nectar-thick liquid   Liquid Administration via: Cup;Straw Medication Administration: Whole meds with puree Supervision: Staff to assist with self feeding Compensations: Slow rate;Small sips/bites Postural Changes: Seated upright at 90 degrees    Other  Recommendations Oral Care Recommendations: Oral care BID Other Recommendations: Order thickener from pharmacy   Follow up Recommendations  24 hour  supervision/assistance    Frequency and Duration min 2x/week  2 weeks       Prognosis Prognosis for Safe Diet Advancement: Good      Swallow Study   General HPI: 55 yo female smoker with altered mental status from [facility] and IVH. Found to have cerebral aneurysm, intubated 1/12, 1/13 suboccipital craniotomy, C1 laminectomy, clipping of posterior circulation aneurysm, extubated 1/14. Has IVD.  Type of Study: Bedside Swallow Evaluation Diet Prior to this Study: NPO;NG Tube Temperature Spikes Noted: Yes Respiratory Status: Room air History of Recent Intubation: Yes Length of Intubations (days): 3 days Date extubated: 01/05/16 Behavior/Cognition: Alert;Cooperative Oral Cavity Assessment: Within Functional Limits Oral Care Completed by SLP: No Oral Cavity - Dentition: Adequate natural dentition Vision: Functional for self-feeding Self-Feeding Abilities: Needs assist Patient Positioning: Upright in bed Baseline Vocal Quality: Hoarse (very slightly hoarse, improved as session progressed) Volitional Cough: Strong Volitional Swallow: Able to elicit    Oral/Motor/Sensory Function Overall Oral Motor/Sensory Function: Within functional limits   Ice Chips     Thin Liquid Thin Liquid: Impaired Presentation: Cup;Straw Pharyngeal  Phase Impairments: Cough - Immediate    Nectar Thick Nectar Thick Liquid: Within functional limits   Honey Thick Honey Thick Liquid: Not tested   Puree Puree: Within functional limits   Solid   GO   Solid: Within functional limits       [facility], MA CCC-SLP [id number]  Lynann Beaver 01/06/2016,11:48 AM

## 2016-01-06 NOTE — Progress Notes (Signed)
Subjective: Patient report none  Objective: Vital signs in last 24 hours: Temp:  [98.2 F (36.8 C)-101.5 F (38.6 C)] 98.2 F (36.8 C) (01/15 1200) Pulse Rate:  [62-98] 62 (01/15 1000) Resp:  [14-30] 19 (01/15 1000) BP: (110-154)/(57-82) 112/64 mmHg (01/15 1000) SpO2:  [90 %-100 %] 100 % (01/15 1000) Arterial Line BP: (124-180)/(52-80) 133/55 mmHg (01/15 1000) FiO2 (%):  [40 %] 40 % (01/14 1400) Weight:  [88.9 kg (195 lb 15.8 oz)] 88.9 kg (195 lb 15.8 oz) (01/15 0219)  Intake/Output from previous day: 01/14 0701 - 01/15 0700 In: 3764.2 [I.V.:2544.2; NG/GT:610; IV Piggyback:610] Out: [id number] [Urine:3360; Drains:115] Intake/Output this shift: Total I/O In: 525 [I.V.:300; NG/GT:120; IV Piggyback:105] Out: 341 [Urine:335; Drains:6]  More awake, no complains, following commands. IVC working  Lab Results:  Recent Labs  01/05/16 0348 01/06/16 0552  WBC 10.9* 10.8*  HGB 9.0* 7.7*  HCT 28.6* 23.8*  PLT 161 140*   BMET  Recent Labs  01/05/16 0348 01/06/16 0552  NA 143 140  K 4.0 3.8  CL 113* 113*  CO2 23 21*  GLUCOSE 119* 140*  BUN 12 17  CREATININE 1.00 0.98  CALCIUM 8.9 8.9    Studies/Results: Dg Chest Port 1 View  01/06/2016  CLINICAL DATA:  Respiratory failure, history hypertension, SVT, WPW syndrome, smoking EXAM: PORTABLE CHEST 1 VIEW COMPARISON:  Portable exam 0634 hours compared to 01/05/2016 FINDINGS: Nasogastric tube extends into stomach. LEFT jugular central venous catheter tip projects over SVC. Numerous EKG leads project over chest. Endotracheal tube no longer identified. Enlargement of cardiac silhouette. Increased BILATERAL pulmonary infiltrates predominately airspace, question edema versus pneumonia. No gross pleural effusion or pneumothorax. IMPRESSION: Enlargement of cardiac silhouette. Increased BILATERAL pulmonary infiltrates question edema versus infection. Electronically Signed   By: Lavonia Dana M.D.   On: 01/06/2016 10:12   Dg Chest Port 1  View  01/05/2016  CLINICAL DATA:  Two followup exam. Respiratory failure. Intubated patient. EXAM: PORTABLE CHEST 1 VIEW COMPARISON:  01/04/2016 FINDINGS: Endotracheal tube and nasogastric tube are stable and well positioned. Left internal jugular central venous line tip projects at the confluence of the left brachiocephalic vein and superior vena cava, also stable. There is clearing of the left lung when compared to the previous day's study. Mild increased opacities noted at the right lung base and there is mild opacity at the left lung base most likely atelectasis. No pulmonary edema. Small effusions are suspected. No pneumothorax. No IMPRESSION: 1. Improved lung aeration when compared to the previous day's study with clearing of much of the airspace opacity noted in the left lung. 2. Lung base opacity is noted, increased on the right from the previous day's study, most likely due to atelectasis. 3. Support apparatus is stable. Electronically Signed   By: Lajean Manes M.D.   On: 01/05/2016 08:25   Dg Abd Portable 1v  01/05/2016  CLINICAL DATA:  Nasogastric tube placement.  Initial encounter. EXAM: PORTABLE ABDOMEN - 1 VIEW COMPARISON:  Abdominal radiograph performed 01/03/2016 FINDINGS: The patient's enteric tube is noted ending overlying the fundus of the stomach. This could be advanced a few cm, as deemed clinically appropriate, since the side port is likely at the distal esophagus. The visualized bowel gas pattern is grossly unremarkable. No acute osseous abnormalities are seen. No definite free intra-abdominal air is seen, though evaluation for free air is limited on a single supine view. IMPRESSION: Enteric tube noted ending overlying the fundus of the stomach. This could be advanced a few cm,  as deemed clinically appropriate, since the side port is likely at the distal esophagus. Electronically Signed   By: Garald Balding M.D.   On: 01/05/2016 20:36    Assessment/Plan: stable  LOS: 3 days   Observation. ivc draining   Bebe Moncure M 01/06/2016, 12:28 PM

## 2016-01-06 NOTE — Progress Notes (Signed)
RT redressed ALINE and changed out Aline bag per RN request. Backflow of blood in saline bag. RN present.

## 2016-01-06 NOTE — Progress Notes (Signed)
PULMONARY / CRITICAL CARE MEDICINE   Name: [name] [name] MRN: [id number] DOB: 06-13-1961    ADMISSION DATE:  01/03/2016 CONSULTATION DATE:  01/03/16  REFERRING MD:  Ditty  (neurosurgery)  CHIEF COMPLAINT:  Altered mental status  SUBJECTIVE:  C/o head and neck pain.  VITAL SIGNS: BP 112/64 mmHg  Pulse 62  Temp(Src) 100.2 F (37.9 C) (Core (Comment))  Resp 19  Ht 5\' 7"  (1.702 m)  Wt 195 lb 15.8 oz (88.9 kg)  BMI 30.69 kg/m2  SpO2 100%  HEMODYNAMICS: CVP:  [1 mmHg-12 mmHg] 12 mmHg  VENTILATOR SETTINGS: Vent Mode:  [-] PSV FiO2 (%):  [40 %] 40 % PEEP:  [5 cmH20] 5 cmH20 Pressure Support:  [15 cmH20] 15 cmH20  INTAKE / OUTPUT: I/O last 3 completed shifts: In: 5841.6 [I.V.:4156.6; NG/GT:770; IV Piggyback:915] Out: 5410 [Urine:5255; Drains:155]  PHYSICAL EXAMINATION: General: alert Neuro: follows commands HEENT: NG tube in place Cardiovascular:  regular Lungs: b/l crackles Abdomen: soft, non tender Musculoskeletal:  1+ edema  LABS:  BMET  Recent Labs Lab 01/04/16 0546 01/04/16 1722 01/05/16 0348 01/06/16 0552  NA 143 143 143 140  K 3.9 4.2 4.0 3.8  CL 114*  --  113* 113*  CO2 22  --  23 21*  BUN 10  --  12 17  CREATININE 1.07*  --  1.00 0.98  GLUCOSE 125*  --  119* 140*    Electrolytes  Recent Labs Lab 01/04/16 0546 01/05/16 0348 01/06/16 0552  CALCIUM 8.8* 8.9 8.9  MG 1.9 2.0  --   PHOS 3.0 3.9  --     CBC  Recent Labs Lab 01/03/16 0710  01/04/16 1722 01/05/16 0348 01/06/16 0552  WBC 11.3*  --   --  10.9* 10.8*  HGB 12.6  < > 10.2* 9.0* 7.7*  HCT 39.8  < > 30.0* 28.6* 23.8*  PLT 224  --   --  161 140*  < > = values in this interval not displayed.  Coag's  Recent Labs Lab 01/03/16 0710  APTT 31  INR 1.02    Sepsis Markers  Recent Labs Lab 01/03/16 0145 01/03/16 1101  LATICACIDVEN 3.81* 2.0    ABG  Recent Labs Lab 01/03/16 2150 01/04/16 1722  PHART 7.345* 7.329*  PCO2ART 35.2 38.8  PO2ART 250.0* 277.0*     Liver Enzymes  Recent Labs Lab 01/03/16 0124 01/03/16 0710  AST 38 33  ALT 39 37  ALKPHOS 69 65  BILITOT 0.2* 0.3  ALBUMIN 4.0 3.5    Cardiac Enzymes  Recent Labs Lab 01/03/16 0124  TROPONINI <0.03    Glucose  Recent Labs Lab 01/03/16 0414 01/06/16 0932  GLUCAP 117* 143*    Imaging Dg Chest Port 1 View  01/06/2016  CLINICAL DATA:  Respiratory failure, history hypertension, SVT, WPW syndrome, smoking EXAM: PORTABLE CHEST 1 VIEW COMPARISON:  Portable exam 0634 hours compared to 01/05/2016 FINDINGS: Nasogastric tube extends into stomach. LEFT jugular central venous catheter tip projects over SVC. Numerous EKG leads project over chest. Endotracheal tube no longer identified. Enlargement of cardiac silhouette. Increased BILATERAL pulmonary infiltrates predominately airspace, question edema versus pneumonia. No gross pleural effusion or pneumothorax. IMPRESSION: Enlargement of cardiac silhouette. Increased BILATERAL pulmonary infiltrates question edema versus infection. Electronically Signed   By: Lavonia Dana M.D.   On: 01/06/2016 10:12   Dg Abd Portable 1v  01/05/2016  CLINICAL DATA:  Nasogastric tube placement.  Initial encounter. EXAM: PORTABLE ABDOMEN - 1 VIEW COMPARISON:  Abdominal radiograph performed 01/03/2016 FINDINGS:  The patient's enteric tube is noted ending overlying the fundus of the stomach. This could be advanced a few cm, as deemed clinically appropriate, since the side port is likely at the distal esophagus. The visualized bowel gas pattern is grossly unremarkable. No acute osseous abnormalities are seen. No definite free intra-abdominal air is seen, though evaluation for free air is limited on a single supine view. IMPRESSION: Enteric tube noted ending overlying the fundus of the stomach. This could be advanced a few cm, as deemed clinically appropriate, since the side port is likely at the distal esophagus. Electronically Signed   By: Garald Balding M.D.   On:  01/05/2016 20:36     STUDIES:  1/12 CT head >> SAH with IVH 1/12 CTA head >> multiple aneurysms with 7 mm Lt PICA aneurysm as source of SAH  CULTURES:  ANTIBIOTICS: 1/12 Ancef >> 1/13  SIGNIFICANT EVENTS: 1/12 Admit, to OR 1/13 To OR >> suboccipital craniotomy, C1 laminectomy, clipping of posterior circulation aneurysm  LINES/TUBES: 1/12 Lt brachial aline >> 1/12 Lt IJ CVL >> 1/12 ETT >> 1/14 1/12 IVD >>  DISCUSSION: 55 yo female smoker with altered mental status from [facility] and IVH.  Found to have cerebral aneurysm.  She has hx of HTN, WPW.  ASSESSMENT / PLAN:  NEUROLOGIC A: Acute encephalopathy 2nd to [facility], IVH from posterior circulation aneurysm s/p clipping. P:   Post op care, albumin, mannitol, decadron, AED's, IVD per neurology Continue nimotop  PULMONARY A: Compromised airway. Hx of tobacco abuse. P:   Oxygen to keep SpO2 > 92% F/u CXR intermittently  CARDIOVASCULAR A: A fib with RVR >> transient on admission. Hx of WPW, HTN. Interstitial edema on CXR. P:  Goal SBP < 160 per neurosurgery PRN labetolol  Check Echo  Diuresis limited in setting of SAH and monitor for vasospasm  RENAL A: Hypokalemia. P:   F/u and replace electrolytes as needed  GASTROINTESTINAL A: Nutrition. Dysphagia. P:   Tube feeds  F/u with speech therapy Protonix for SUP   HEMATOLOGIC A: Anemia of critical illness >> no evidence for bleeding. P:  F/u CBC SCD's for DVT prevention  INFECTIOUS A: No evidence for infection. P:   Monitor clinically  ENDOCRINE A: Hyperglycemia. P:   Monitor glucose on chem    Chesley Mires, MD [place] 01/06/2016, 10:32 AM Pager:  [phone] After 3pm call: [phone]

## 2016-01-07 ENCOUNTER — Ambulatory Visit (HOSPITAL_COMMUNITY): Payer: BLUE CROSS/BLUE SHIELD

## 2016-01-07 ENCOUNTER — Encounter (HOSPITAL_COMMUNITY): Payer: Self-pay | Admitting: Neurological Surgery

## 2016-01-07 DIAGNOSIS — I10 Essential (primary) hypertension: Secondary | ICD-10-CM

## 2016-01-07 DIAGNOSIS — R0600 Dyspnea, unspecified: Secondary | ICD-10-CM

## 2016-01-07 DIAGNOSIS — E878 Other disorders of electrolyte and fluid balance, not elsewhere classified: Secondary | ICD-10-CM

## 2016-01-07 LAB — CBC
HCT: 25.5 % — ABNORMAL LOW (ref 36.0–46.0)
Hemoglobin: 8.1 g/dL — ABNORMAL LOW (ref 12.0–15.0)
MCH: 27.3 pg (ref 26.0–34.0)
MCHC: 31.8 g/dL (ref 30.0–36.0)
MCV: 85.9 fL (ref 78.0–100.0)
Platelets: 186 10*3/uL (ref 150–400)
RBC: 2.97 MIL/uL — ABNORMAL LOW (ref 3.87–5.11)
RDW: 14.5 % (ref 11.5–15.5)
WBC: 12 10*3/uL — ABNORMAL HIGH (ref 4.0–10.5)

## 2016-01-07 LAB — BASIC METABOLIC PANEL
Anion gap: 6 (ref 5–15)
BUN: 21 mg/dL — ABNORMAL HIGH (ref 6–20)
CO2: 22 mmol/L (ref 22–32)
Calcium: 9.2 mg/dL (ref 8.9–10.3)
Chloride: 115 mmol/L — ABNORMAL HIGH (ref 101–111)
Creatinine, Ser: 1.07 mg/dL — ABNORMAL HIGH (ref 0.44–1.00)
GFR calc Af Amer: >60 mL/min (ref >60)
GFR calc non Af Amer: 57 mL/min — ABNORMAL LOW (ref >60)
Glucose, Bld: 113 mg/dL — ABNORMAL HIGH (ref 65–99)
Potassium: 4 mmol/L (ref 3.5–5.1)
Sodium: 143 mmol/L (ref 135–145)

## 2016-01-07 LAB — GLUCOSE, CAPILLARY
Glucose-Capillary: 115 mg/dL — ABNORMAL HIGH (ref 65–99)
Glucose-Capillary: 113 mg/dL — ABNORMAL HIGH (ref 65–99)
Glucose-Capillary: 102 mg/dL — ABNORMAL HIGH (ref 65–99)
Glucose-Capillary: 120 mg/dL — ABNORMAL HIGH (ref 65–99)
Glucose-Capillary: 101 mg/dL — ABNORMAL HIGH (ref 65–99)

## 2016-01-07 MED ORDER — PERFLUTREN LIPID MICROSPHERE
1.0000 mL | INTRAVENOUS | Status: AC | PRN
  Administered 2016-01-07: 2 mL via INTRAVENOUS
  Filled 2016-01-07: qty 10

## 2016-01-07 MED ORDER — ENSURE ENLIVE PO LIQD
237.0000 mL | Freq: Two times a day (BID) | ORAL | Status: DC
  Administered 2016-01-08 – 2016-01-17 (×12): 237 mL via ORAL

## 2016-01-07 NOTE — Progress Notes (Signed)
PULMONARY / CRITICAL CARE MEDICINE   Name: [name] [name] MRN: [id number] DOB: [birth year]    ADMISSION DATE:  01/03/2016 CONSULTATION DATE:  01/03/16  REFERRING MD:  Ditty  (neurosurgery)  CHIEF COMPLAINT:  Altered mental status  SUBJECTIVE:  No events overnight.  VITAL SIGNS: BP 157/77 mmHg  Pulse 87  Temp(Src) 98.4 F (36.9 C) (Oral)  Resp 20  Ht 5\' 7"  (1.702 m)  Wt 89.5 kg (197 lb 5 oz)  BMI 30.90 kg/m2  SpO2 99%  HEMODYNAMICS: CVP:  [8 mmHg-15 mmHg] 8 mmHg  VENTILATOR SETTINGS:    INTAKE / OUTPUT: I/O last 3 completed shifts: In: [id number] [P.O.:360; I.V.:3600; NG/GT:970; IV Piggyback:715] Out: [id number]; Drains:147]  PHYSICAL EXAMINATION: General: alert and interactive. Neuro: Moving all ext to command, ataxia on the left however. HEENT: IVD in place, NGT out. Cardiovascular:  RRR, Nl S1/S2, -M/R/G. Lungs: Bibasilar crackles noted. Abdomen: soft, non tender. Musculoskeletal:  1+ edema.  LABS:  BMET  Recent Labs Lab 01/05/16 0348 01/06/16 0552 01/07/16 0451  NA 143 140 143  K 4.0 3.8 4.0  CL 113* 113* 115*  CO2 23 21* 22  BUN 12 17 21*  CREATININE 1.00 0.98 1.07*  GLUCOSE 119* 140* 113*    Electrolytes  Recent Labs Lab 01/04/16 0546 01/05/16 0348 01/06/16 0552 01/07/16 0451  CALCIUM 8.8* 8.9 8.9 9.2  MG 1.9 2.0  --   --   PHOS 3.0 3.9  --   --     CBC  Recent Labs Lab 01/05/16 0348 01/06/16 0552 01/06/16 1830 01/07/16 0451  WBC 10.9* 10.8*  --  12.0*  HGB 9.0* 7.7* 8.0* 8.1*  HCT 28.6* 23.8* 25.0* 25.5*  PLT 161 140*  --  186    Coag's  Recent Labs Lab 01/03/16 0710  APTT 31  INR 1.02    Sepsis Markers  Recent Labs Lab 01/03/16 0145 01/03/16 1101  LATICACIDVEN 3.81* 2.0    ABG  Recent Labs Lab 01/03/16 2150 01/04/16 1722  PHART 7.345* 7.329*  PCO2ART 35.2 38.8  PO2ART 250.0* 277.0*    Liver Enzymes  Recent Labs Lab 01/03/16 0124 01/03/16 0710  AST 38 33  ALT 39 37  ALKPHOS 69 65   BILITOT 0.2* 0.3  ALBUMIN 4.0 3.5    Cardiac Enzymes  Recent Labs Lab 01/03/16 0124  TROPONINI <0.03    Glucose  Recent Labs Lab 01/06/16 1528 01/06/16 1935 01/06/16 2317 01/07/16 0415 01/07/16 0844 01/07/16 1254  GLUCAP 112* 112* 105* 115* 113* 102*    Imaging No results found.   STUDIES:  1/12 CT head >> SAH with IVH 1/12 CTA head >> multiple aneurysms with 7 mm Lt PICA aneurysm as source of SAH  CULTURES:  ANTIBIOTICS: 1/12 Ancef >> 1/13  SIGNIFICANT EVENTS: 1/12 Admit, to OR 1/13 To OR >> suboccipital craniotomy, C1 laminectomy, clipping of posterior circulation aneurysm  LINES/TUBES: 1/12 Lt brachial aline >> 1/12 Lt IJ CVL >> 1/12 ETT >> 1/14 1/12 IVD >>  I reviewed CXR myself, bibasilar atelectasis and some pulmonary edema noted.  DISCUSSION: 55 yo female smoker with altered mental status from [facility] and IVH.  Found to have cerebral aneurysm.  She has hx of HTN, WPW.  ASSESSMENT / PLAN:  NEUROLOGIC A: Acute encephalopathy 2nd to [facility], IVH from posterior circulation aneurysm s/p clipping. P:   Post op care, albumin, mannitol, decadron, AED's, IVD per neurology Continue nimotop. Continue Keppra per NS. Continue steroids per NS.  PULMONARY A: Compromised airway. Hx of tobacco  abuse. P:   Oxygen to keep SpO2 > 92%. F/u CXR intermittently.  CARDIOVASCULAR A: A fib with RVR >> transient on admission. Hx of WPW, HTN. Interstitial edema on CXR. P:  Goal SBP < 160 per neurosurgery. PRN labetolol. Echo noted with EF of 55-60% with no evidence of diastolic heart failure. Diuresis limited in setting of SAH and monitor for vasospasm.  RENAL A: Hyperchloremia Hypokalemia. P:   BMET in AM. Replace electrolytes as indicated. NS at 100 ml/hr. Hold diureses for now.  GASTROINTESTINAL A: Nutrition. Dysphagia. P:   Diet per speech recommendations. Protonix for SUP.  HEMATOLOGIC A: Anemia of critical illness >> no evidence for  bleeding. P:  F/u CBC. SCD's for DVT prevention.  INFECTIOUS A: No evidence for infection. P:   Monitor clinically.  ENDOCRINE A: Hyperglycemia. P:   Monitor glucose on chem   Discussed with bedside RN.  Rush Farmer, M.D. [facility] Pulmonary/Critical Care Medicine. Pager: [phone]. After hours pager: [phone].

## 2016-01-07 NOTE — Progress Notes (Signed)
  Echocardiogram 2D Echocardiogram with Definity has been performed.  Jennette Dubin 01/07/2016, 9:44 AM

## 2016-01-07 NOTE — Progress Notes (Signed)
Nutrition Follow-up  INTERVENTION:   Ensure Enlive po BID, each supplement provides 350 kcal and 20 grams of protein  NUTRITION DIAGNOSIS:   Inadequate oral intake related to lethargy/confusion as evidenced by meal completion < 50%. Ongoing.   GOAL:   Patient will meet greater than or equal to 90% of their needs Not met.   MONITOR:   PO intake, Supplement acceptance, Diet advancement, Labs, I & O's  ASSESSMENT:   55yo female with hx HTN, WPW syndrome admitted 1/12 with SAH in setting multiple aneurysms s/p ventriculostomy.   Labs reviewed: CBG's: 105-115 1/14 Extubated 1/15 Dysphagia 3/Nectar diet started. Meal completion 25% Pt discussed during ICU rounds and with RN.   Diet Order:  DIET DYS 3 Room service appropriate?: Yes; Fluid consistency:: Nectar Thick  Skin:  Reviewed, no issues  Last BM:  1/16 smear  Height:   Ht Readings from Last 1 Encounters:  01/03/16 '5\' 7"'  (1.702 m)   Weight:   Wt Readings from Last 1 Encounters:  01/07/16 197 lb 5 oz (89.5 kg)    Ideal Body Weight:  61.3 kg  BMI:  Body mass index is 30.9 kg/(m^2).  Estimated Nutritional Needs:   Kcal:  1700-1900  Protein:  85-100 grams  Fluid:  > 1.7 L/day  EDUCATION NEEDS:   No education needs identified at this time  [place], [place], [place] Pager [phone] After Hours Pager

## 2016-01-07 NOTE — Progress Notes (Signed)
No acute events Awake and alert, confused Follows commands throughout No drift EVD functioning Stable Continue CSF diversion and vasospasm monitoring

## 2016-01-08 LAB — TYPE AND SCREEN
ABO/RH(D): O POS
Antibody Screen: NEGATIVE
Unit division: 0
Unit division: 0
Unit division: 0
Unit division: 0
Unit division: 0
Unit division: 0
Unit division: 0
Unit division: 0

## 2016-01-08 LAB — GLUCOSE, CAPILLARY
Glucose-Capillary: 104 mg/dL — ABNORMAL HIGH (ref 65–99)
Glucose-Capillary: 106 mg/dL — ABNORMAL HIGH (ref 65–99)
Glucose-Capillary: 110 mg/dL — ABNORMAL HIGH (ref 65–99)
Glucose-Capillary: 120 mg/dL — ABNORMAL HIGH (ref 65–99)
Glucose-Capillary: 129 mg/dL — ABNORMAL HIGH (ref 65–99)
Glucose-Capillary: 148 mg/dL — ABNORMAL HIGH (ref 65–99)

## 2016-01-08 MED ORDER — HYDROMORPHONE HCL 1 MG/ML IJ SOLN
0.5000 mg | INTRAMUSCULAR | Status: DC | PRN
  Administered 2016-01-09: 0.5 mg via INTRAVENOUS
  Filled 2016-01-08 (×2): qty 1

## 2016-01-08 MED ORDER — INFLUENZA VAC SPLIT QUAD 0.5 ML IM SUSY
0.5000 mL | PREFILLED_SYRINGE | INTRAMUSCULAR | Status: DC
  Filled 2016-01-08 (×2): qty 0.5

## 2016-01-08 NOTE — Progress Notes (Signed)
PULMONARY / CRITICAL CARE MEDICINE   Name: [name] [name] MRN: [id number] DOB: 10-06-1961    ADMISSION DATE:  01/03/2016 CONSULTATION DATE:  01/03/16  REFERRING MD:  Ditty  (neurosurgery)  CHIEF COMPLAINT:  Altered mental status  SUBJECTIVE:  No events overnight.  VITAL SIGNS: BP 137/80 mmHg  Pulse 86  Temp(Src) 99.4 F (37.4 C) (Oral)  Resp 20  Ht 5\' 7"  (1.702 m)  Wt 87.9 kg (193 lb 12.6 oz)  BMI 30.34 kg/m2  SpO2 100%  HEMODYNAMICS: CVP:  [4 mmHg-13 mmHg] 8 mmHg  VENTILATOR SETTINGS:    INTAKE / OUTPUT: I/O last 3 completed shifts: In: [id number] [I.V.:3600; IV [id number] Out: [id number] [Urine:4510; Drains:154]  PHYSICAL EXAMINATION: General: alert and interactive. Neuro: Moving all ext to command, ataxia on the left however. HEENT: IVD in place, NGT out. Cardiovascular:  RRR, Nl S1/S2, -M/R/G. Lungs: Bibasilar crackles noted. Abdomen: soft, non tender. Musculoskeletal:  1+ edema.  LABS:  BMET  Recent Labs Lab 01/05/16 0348 01/06/16 0552 01/07/16 0451  NA 143 140 143  K 4.0 3.8 4.0  CL 113* 113* 115*  CO2 23 21* 22  BUN 12 17 21*  CREATININE 1.00 0.98 1.07*  GLUCOSE 119* 140* 113*    Electrolytes  Recent Labs Lab 01/04/16 0546 01/05/16 0348 01/06/16 0552 01/07/16 0451  CALCIUM 8.8* 8.9 8.9 9.2  MG 1.9 2.0  --   --   PHOS 3.0 3.9  --   --     CBC  Recent Labs Lab 01/05/16 0348 01/06/16 0552 01/06/16 1830 01/07/16 0451  WBC 10.9* 10.8*  --  12.0*  HGB 9.0* 7.7* 8.0* 8.1*  HCT 28.6* 23.8* 25.0* 25.5*  PLT 161 140*  --  186    Coag's  Recent Labs Lab 01/03/16 0710  APTT 31  INR 1.02    Sepsis Markers  Recent Labs Lab 01/03/16 0145 01/03/16 1101  LATICACIDVEN 3.81* 2.0    ABG  Recent Labs Lab 01/03/16 2150 01/04/16 1722  PHART 7.345* 7.329*  PCO2ART 35.2 38.8  PO2ART 250.0* 277.0*    Liver Enzymes  Recent Labs Lab 01/03/16 0124 01/03/16 0710  AST 38 33  ALT 39 37  ALKPHOS 69 65  BILITOT 0.2* 0.3   ALBUMIN 4.0 3.5    Cardiac Enzymes  Recent Labs Lab 01/03/16 0124  TROPONINI <0.03    Glucose  Recent Labs Lab 01/07/16 0844 01/07/16 1254 01/07/16 1655 01/07/16 1936 01/07/16 2332 01/08/16 0400  GLUCAP 113* 102* 120* 101* 104* 120*    Imaging No results found.   STUDIES:  1/12 CT head >> SAH with IVH 1/12 CTA head >> multiple aneurysms with 7 mm Lt PICA aneurysm as source of SAH  CULTURES:  ANTIBIOTICS: 1/12 Ancef >> 1/13  SIGNIFICANT EVENTS: 1/12 Admit, to OR 1/13 To OR >> suboccipital craniotomy, C1 laminectomy, clipping of posterior circulation aneurysm  LINES/TUBES: 1/12 Lt brachial aline >> 1/12 Lt IJ CVL >> 1/12 ETT >> 1/14 1/12 IVD >>  I reviewed CXR myself, bibasilar atelectasis and some pulmonary edema noted.  DISCUSSION: 55 yo female smoker with altered mental status from [facility] and IVH.  Found to have cerebral aneurysm.  She has hx of HTN, WPW.  ASSESSMENT / PLAN:  NEUROLOGIC A: Acute encephalopathy 2nd to [facility], IVH from posterior circulation aneurysm s/p clipping. P:   Post op care, albumin, mannitol, decadron, AED's, IVD per neurology Continue nimotop. Continue Keppra per NS. Continue steroids per NS. IVD per neurosurgery. Keep in the ICU for the vasospasm  window per NS.  PULMONARY A: Compromised airway. Hx of tobacco abuse. P:   Oxygen to keep SpO2 > 92%. F/u CXR intermittently.  CARDIOVASCULAR A: A fib with RVR >> transient on admission. Hx of WPW, HTN. Interstitial edema on CXR. P:  Goal SBP < 160 per neurosurgery. PRN labetolol. Echo noted with EF of 55-60% with no evidence of diastolic heart failure. Diuresis limited in setting of SAH and monitor for vasospasm.  RENAL A: Hyperchloremia Hypokalemia. P:   BMET in AM. Replace electrolytes as indicated. NS at 100 ml/hr. Hold diureses for now.  GASTROINTESTINAL A: Nutrition. Dysphagia. P:   Diet per speech recommendations. Protonix for  SUP.  HEMATOLOGIC A: Anemia of critical illness >> no evidence for bleeding. P:  F/u CBC. SCD's for DVT prevention.  INFECTIOUS A: No evidence for infection. P:   Monitor clinically.  ENDOCRINE A: Hyperglycemia. P:   Monitor glucose on chem   Discussed with bedside RN.  Rush Farmer, M.D. [facility] Pulmonary/Critical Care Medicine. Pager: [phone]. After hours pager: [phone].

## 2016-01-08 NOTE — Progress Notes (Signed)
Pt seen and examined. No issues overnight.  EXAM: Temp:  [98.3 F (36.8 C)-99.4 F (37.4 C)] 99.4 F (37.4 C) (01/17 0800) Pulse Rate:  [67-101] 86 (01/17 0700) Resp:  [14-33] 20 (01/17 0700) BP: (136-167)/(73-92) 137/80 mmHg (01/17 0700) SpO2:  [91 %-100 %] 100 % (01/17 0700) Arterial Line BP: (156-182)/(73-87) 171/82 mmHg (01/17 0700) Weight:  [87.9 kg (193 lb 12.6 oz)] 87.9 kg (193 lb 12.6 oz) (01/17 0400) Intake/Output      01/16 0701 - 01/17 0700 01/17 0701 - 01/18 0700   P.O.     I.V. (mL/kg) 2400 (27.3)    NG/GT     IV Piggyback 610    Total Intake(mL/kg) 3010 (34.2)    Urine (mL/kg/hr) 2800 (1.3)    Drains 94 (0)    Stool 0 (0)    Total Output 2894     Net +116          Urine Occurrence 3 x    Stool Occurrence 3 x     Awake and alert Follows commands throughout Full strength Incision c/d/i and flat  Stable Continue EVD at 10cm H2O Continue hyperdynamic therapy

## 2016-01-08 NOTE — Progress Notes (Signed)
Speech Language Pathology Treatment: Dysphagia  Patient Details Name: [name] [name] MRN: [id number] DOB: [birth year] Today's Date: 01/08/2016 Time: 1400-1430 SLP Time Calculation (min) (ACUTE ONLY): 30 min  Assessment / Plan / Recommendation Clinical Impression  Pt alert, animated, talkative and intermittently crying, laughing.  Assisted with lunch meal - requires min verbal cues for safety, needs encouragement to eat.  Trials of thin liquid not tolerated, leading to coughing, concerns for aspiration.  Continues to tolerate nectars well.  Continue dysphagia 3, nectars with full supervision to assist with self feeding.     HPI HPI: 55 yo female smoker with altered mental status from [facility] and IVH. Found to have cerebral aneurysm, intubated 1/12, 1/13 suboccipital craniotomy, C1 laminectomy, clipping of posterior circulation aneurysm, extubated 1/14. Has IVD.       SLP Plan  Continue with current plan of care     Recommendations  Diet recommendations: Dysphagia 3 (mechanical soft);Nectar-thick liquid Liquids provided via: Cup Medication Administration: Whole meds with puree Supervision: Staff to assist with self feeding Compensations: Slow rate;Small sips/bites Postural Changes and/or Swallow Maneuvers: Seated upright 90 degrees             Oral Care Recommendations: Oral care BID Plan: Continue with current plan of care                   [name] [name], [place] CCC/SLP Pager [phone]   [name] [name] 01/08/2016, 2:38 PM

## 2016-01-08 NOTE — Progress Notes (Signed)
Dr Cyndy Freeze on floor and notified of patients episodes of bradycardia after administration of 2mg  of Diluadid. New orders received and carried out. Will continue to monitor. Lianne Bushy RN BSN.

## 2016-01-09 LAB — GLUCOSE, CAPILLARY
Glucose-Capillary: 79 mg/dL (ref 65–99)
Glucose-Capillary: 112 mg/dL — ABNORMAL HIGH (ref 65–99)
Glucose-Capillary: 103 mg/dL — ABNORMAL HIGH (ref 65–99)
Glucose-Capillary: 117 mg/dL — ABNORMAL HIGH (ref 65–99)
Glucose-Capillary: 100 mg/dL — ABNORMAL HIGH (ref 65–99)
Glucose-Capillary: 118 mg/dL — ABNORMAL HIGH (ref 65–99)

## 2016-01-09 LAB — BASIC METABOLIC PANEL
Anion gap: 9 (ref 5–15)
BUN: 24 mg/dL — ABNORMAL HIGH (ref 6–20)
CO2: 22 mmol/L (ref 22–32)
Calcium: 9.9 mg/dL (ref 8.9–10.3)
Chloride: 111 mmol/L (ref 101–111)
Creatinine, Ser: 0.93 mg/dL (ref 0.44–1.00)
GFR calc Af Amer: >60 mL/min (ref >60)
GFR calc non Af Amer: >60 mL/min (ref >60)
Glucose, Bld: 123 mg/dL — ABNORMAL HIGH (ref 65–99)
Potassium: 4 mmol/L (ref 3.5–5.1)
Sodium: 142 mmol/L (ref 135–145)

## 2016-01-09 LAB — CBC
HCT: 27.2 % — ABNORMAL LOW (ref 36.0–46.0)
Hemoglobin: 8.8 g/dL — ABNORMAL LOW (ref 12.0–15.0)
MCH: 27.9 pg (ref 26.0–34.0)
MCHC: 32.4 g/dL (ref 30.0–36.0)
MCV: 86.3 fL (ref 78.0–100.0)
Platelets: 246 10*3/uL (ref 150–400)
RBC: 3.15 MIL/uL — ABNORMAL LOW (ref 3.87–5.11)
RDW: 14.2 % (ref 11.5–15.5)
WBC: 13.6 10*3/uL — ABNORMAL HIGH (ref 4.0–10.5)

## 2016-01-09 LAB — MAGNESIUM: Magnesium: 2.2 mg/dL (ref 1.7–2.4)

## 2016-01-09 LAB — PHOSPHORUS: Phosphorus: 3.3 mg/dL (ref 2.5–4.6)

## 2016-01-09 MED ORDER — OXYCODONE-ACETAMINOPHEN 5-325 MG PO TABS
1.0000 | ORAL_TABLET | Freq: Four times a day (QID) | ORAL | Status: DC | PRN
Start: 2016-01-09 — End: 2016-01-17
  Administered 2016-01-09 – 2016-01-10 (×3): 1 via ORAL
  Administered 2016-01-10: 2 via ORAL
  Administered 2016-01-10 (×2): 1 via ORAL
  Administered 2016-01-11 – 2016-01-12 (×4): 2 via ORAL
  Administered 2016-01-13: 1 via ORAL
  Administered 2016-01-13 – 2016-01-16 (×5): 2 via ORAL
  Filled 2016-01-09 (×7): qty 2
  Filled 2016-01-09: qty 1
  Filled 2016-01-09: qty 2
  Filled 2016-01-09 (×2): qty 1
  Filled 2016-01-09: qty 2
  Filled 2016-01-09: qty 1
  Filled 2016-01-09 (×2): qty 2
  Filled 2016-01-09: qty 1
  Filled 2016-01-09: qty 2
  Filled 2016-01-09: qty 1

## 2016-01-09 NOTE — Progress Notes (Signed)
No acute events Tmax 101.1, VSS Awake and alert, answers questions Moves all extremities, no drift Incision c/d/i, flat EVD patent and draining Stable Continue hyperdynamic therapy Continue CSF diversion

## 2016-01-09 NOTE — Progress Notes (Signed)
PULMONARY / CRITICAL CARE MEDICINE   Name: [name] [name] MRN: [id number] DOB: 1961-03-17    ADMISSION DATE:  01/03/2016 CONSULTATION DATE:  01/03/16  REFERRING MD:  Ditty  (neurosurgery)  CHIEF COMPLAINT:  Altered mental status  SUBJECTIVE:  Bradycardia overnight after a dose of dilaudid.  VITAL SIGNS: BP 136/80 mmHg  Pulse 73  Temp(Src) 98.4 F (36.9 C) (Oral)  Resp 14  Ht 5\' 7"  (1.702 m)  Wt 88.7 kg (195 lb 8.8 oz)  BMI 30.62 kg/m2  SpO2 100%  HEMODYNAMICS: CVP:  [1 mmHg-15 mmHg] 10 mmHg  VENTILATOR SETTINGS:    INTAKE / OUTPUT: I/O last 3 completed shifts: In: 4210 [I.V.:3600; IV Piggyback:610] Out: [id number] [Urine:3275; Drains:158]  PHYSICAL EXAMINATION: General: alert and interactive. Neuro: Moving all ext to command, ataxia on the left however. HEENT: IVD in place, PERRL, EOM-I and MMM. Cardiovascular:  RRR, Nl S1/S2, -M/R/G. Lungs: Bibasilar crackles noted. Abdomen: soft, non tender. Musculoskeletal:  1+ edema.  LABS:  BMET  Recent Labs Lab 01/06/16 0552 01/07/16 0451 01/09/16 0448  NA 140 143 142  K 3.8 4.0 4.0  CL 113* 115* 111  CO2 21* 22 22  BUN 17 21* 24*  CREATININE 0.98 1.07* 0.93  GLUCOSE 140* 113* 123*    Electrolytes  Recent Labs Lab 01/04/16 0546 01/05/16 0348 01/06/16 0552 01/07/16 0451 01/09/16 0448  CALCIUM 8.8* 8.9 8.9 9.2 9.9  MG 1.9 2.0  --   --  2.2  PHOS 3.0 3.9  --   --  3.3    CBC  Recent Labs Lab 01/06/16 0552 01/06/16 1830 01/07/16 0451 01/09/16 0448  WBC 10.8*  --  12.0* 13.6*  HGB 7.7* 8.0* 8.1* 8.8*  HCT 23.8* 25.0* 25.5* 27.2*  PLT 140*  --  186 246    Coag's  Recent Labs Lab 01/03/16 0710  APTT 31  INR 1.02    Sepsis Markers  Recent Labs Lab 01/03/16 0145 01/03/16 1101  LATICACIDVEN 3.81* 2.0    ABG  Recent Labs Lab 01/03/16 2150 01/04/16 1722  PHART 7.345* 7.329*  PCO2ART 35.2 38.8  PO2ART 250.0* 277.0*    Liver Enzymes  Recent Labs Lab 01/03/16 0124  01/03/16 0710  AST 38 33  ALT 39 37  ALKPHOS 69 65  BILITOT 0.2* 0.3  ALBUMIN 4.0 3.5    Cardiac Enzymes  Recent Labs Lab 01/03/16 0124  TROPONINI <0.03    Glucose  Recent Labs Lab 01/08/16 0718 01/08/16 1126 01/08/16 1605 01/08/16 2317 01/09/16 0313 01/09/16 0733  GLUCAP 106* 148* 129* 110* 112* 103*    Imaging No results found.   STUDIES:  1/12 CT head >> SAH with IVH 1/12 CTA head >> multiple aneurysms with 7 mm Lt PICA aneurysm as source of SAH  CULTURES:  ANTIBIOTICS: 1/12 Ancef >> 1/13  SIGNIFICANT EVENTS: 1/12 Admit, to OR 1/13 To OR >> suboccipital craniotomy, C1 laminectomy, clipping of posterior circulation aneurysm  LINES/TUBES: 1/12 Lt brachial aline >> 1/12 Lt IJ CVL >> 1/12 ETT >> 1/14 1/12 IVD >>  I reviewed CXR myself, bibasilar atelectasis and some pulmonary edema noted.  DISCUSSION: 55 yo female smoker with altered mental status from [facility] and IVH.  Found to have cerebral aneurysm.  She has hx of HTN, WPW.  ASSESSMENT / PLAN:  NEUROLOGIC A: Acute encephalopathy 2nd to [facility], IVH from posterior circulation aneurysm s/p clipping. P:   Post op care, albumin, mannitol, decadron, AED's, IVD per neurosurgery Continue nimotop. Continue Keppra per NS. Continue steroids per  NS. IVD per neurosurgery. Keep in the ICU for the vasospasm window per NS.  PULMONARY A: Compromised airway. Hx of tobacco abuse. P:   Oxygen to keep SpO2 > 92%. F/u CXR intermittently.  CARDIOVASCULAR A: A fib with RVR >> transient on admission. Hx of WPW, HTN. Interstitial edema on CXR. P:  Goal SBP < 160 per neurosurgery. PRN labetolol. Echo noted with EF of 55-60% with no evidence of diastolic heart failure. Diuresis limited in setting of SAH and monitor for vasospasm.  RENAL A: Hyperchloremia Hypokalemia. P:   BMET in AM. Replace electrolytes as indicated. NS at 100 ml/hr. Hold diureses for  now.  GASTROINTESTINAL A: Nutrition. Dysphagia. P:   Diet per speech recommendations. Protonix for SUP.  HEMATOLOGIC A: Anemia of critical illness >> no evidence for bleeding. P:  F/u CBC. SCD's for DVT prevention.  INFECTIOUS A: No evidence for infection. P:   Monitor clinically.  ENDOCRINE A: Hyperglycemia. P:   Monitor glucose on chem   Discussed with bedside RN.  Rush Farmer, M.D. [facility] Pulmonary/Critical Care Medicine. Pager: [phone]. After hours pager: [phone].

## 2016-01-10 ENCOUNTER — Inpatient Hospital Stay (HOSPITAL_COMMUNITY): Payer: BLUE CROSS/BLUE SHIELD

## 2016-01-10 DIAGNOSIS — Z978 Presence of other specified devices: Secondary | ICD-10-CM | POA: Insufficient documentation

## 2016-01-10 DIAGNOSIS — Z789 Other specified health status: Secondary | ICD-10-CM

## 2016-01-10 DIAGNOSIS — Z0189 Encounter for other specified special examinations: Secondary | ICD-10-CM | POA: Insufficient documentation

## 2016-01-10 LAB — BASIC METABOLIC PANEL
Anion gap: 8 (ref 5–15)
BUN: 23 mg/dL — ABNORMAL HIGH (ref 6–20)
CO2: 21 mmol/L — ABNORMAL LOW (ref 22–32)
Calcium: 9.9 mg/dL (ref 8.9–10.3)
Chloride: 110 mmol/L (ref 101–111)
Creatinine, Ser: 0.92 mg/dL (ref 0.44–1.00)
GFR calc Af Amer: >60 mL/min (ref >60)
GFR calc non Af Amer: >60 mL/min (ref >60)
Glucose, Bld: 112 mg/dL — ABNORMAL HIGH (ref 65–99)
Potassium: 4.3 mmol/L (ref 3.5–5.1)
Sodium: 139 mmol/L (ref 135–145)

## 2016-01-10 LAB — CBC
HCT: 27.9 % — ABNORMAL LOW (ref 36.0–46.0)
Hemoglobin: 8.9 g/dL — ABNORMAL LOW (ref 12.0–15.0)
MCH: 27.3 pg (ref 26.0–34.0)
MCHC: 31.9 g/dL (ref 30.0–36.0)
MCV: 85.6 fL (ref 78.0–100.0)
Platelets: 293 10*3/uL (ref 150–400)
RBC: 3.26 MIL/uL — ABNORMAL LOW (ref 3.87–5.11)
RDW: 14 % (ref 11.5–15.5)
WBC: 13.1 10*3/uL — ABNORMAL HIGH (ref 4.0–10.5)

## 2016-01-10 LAB — GLUCOSE, CAPILLARY
Glucose-Capillary: 102 mg/dL — ABNORMAL HIGH (ref 65–99)
Glucose-Capillary: 113 mg/dL — ABNORMAL HIGH (ref 65–99)
Glucose-Capillary: 117 mg/dL — ABNORMAL HIGH (ref 65–99)
Glucose-Capillary: 119 mg/dL — ABNORMAL HIGH (ref 65–99)
Glucose-Capillary: 123 mg/dL — ABNORMAL HIGH (ref 65–99)
Glucose-Capillary: 141 mg/dL — ABNORMAL HIGH (ref 65–99)
Glucose-Capillary: 148 mg/dL — ABNORMAL HIGH (ref 65–99)

## 2016-01-10 LAB — PHOSPHORUS: Phosphorus: 3.2 mg/dL (ref 2.5–4.6)

## 2016-01-10 LAB — MAGNESIUM: Magnesium: 2.2 mg/dL (ref 1.7–2.4)

## 2016-01-10 MED ORDER — PANTOPRAZOLE SODIUM 40 MG PO PACK: 40.0000 mg | PACK | Freq: Every day | ORAL | Status: DC

## 2016-01-10 NOTE — Care Management (Signed)
UR updated.  

## 2016-01-10 NOTE — Progress Notes (Signed)
PULMONARY / CRITICAL CARE MEDICINE   Name: [name] [name] MRN: [id number] DOB: 1961-07-03    ADMISSION DATE:  01/03/2016 CONSULTATION DATE:  01/03/16  REFERRING MD:  Ditty  (neurosurgery)  CHIEF COMPLAINT:  Altered mental status  SUBJECTIVE:  Swallow assessment  VITAL SIGNS: BP 159/81 mmHg  Pulse 62  Temp(Src) 97.4 F (36.3 C) (Oral)  Resp 15  Ht 5\' 7"  (1.702 m)  Wt 88.9 kg (195 lb 15.8 oz)  BMI 30.69 kg/m2  SpO2 100%  HEMODYNAMICS: CVP:  [1 mmHg-17 mmHg] 7 mmHg  VENTILATOR SETTINGS:    INTAKE / OUTPUT: I/O last 3 completed shifts: In: [id number] [P.O.:120; I.V.:3600; Other:100; IV [id number] Out: 3083 [Urine:2925; Drains:158]  PHYSICAL EXAMINATION: General: alert and interactive. Neuro: Moving all ext to command, ataxia HEENT: IVD in place, NGT out. Cardiovascular:  RRR, Nl S1/S2, -M/R/G. Lungs: Bibasilar coarse Abdomen: soft, non tender. Musculoskeletal:  1+ edema.  LABS:  BMET  Recent Labs Lab 01/07/16 0451 01/09/16 0448 01/10/16 0423  NA 143 142 139  K 4.0 4.0 4.3  CL 115* 111 110  CO2 22 22 21*  BUN 21* 24* 23*  CREATININE 1.07* 0.93 0.92  GLUCOSE 113* 123* 112*    Electrolytes  Recent Labs Lab 01/05/16 0348  01/07/16 0451 01/09/16 0448 01/10/16 0423  CALCIUM 8.9  < > 9.2 9.9 9.9  MG 2.0  --   --  2.2 2.2  PHOS 3.9  --   --  3.3 3.2  < > = values in this interval not displayed.  CBC  Recent Labs Lab 01/07/16 0451 01/09/16 0448 01/10/16 0423  WBC 12.0* 13.6* 13.1*  HGB 8.1* 8.8* 8.9*  HCT 25.5* 27.2* 27.9*  PLT 186 246 293    Coag's No results for input(s): APTT, INR in the last 168 hours.  Sepsis Markers No results for input(s): LATICACIDVEN, PROCALCITON, O2SATVEN in the last 168 hours.  ABG  Recent Labs Lab 01/03/16 2150 01/04/16 1722  PHART 7.345* 7.329*  PCO2ART 35.2 38.8  PO2ART 250.0* 277.0*    Liver Enzymes No results for input(s): AST, ALT, ALKPHOS, BILITOT, ALBUMIN in the last 168 hours.  Cardiac  Enzymes No results for input(s): TROPONINI, PROBNP in the last 168 hours.  Glucose  Recent Labs Lab 01/09/16 1507 01/09/16 1921 01/09/16 2321 01/10/16 0317 01/10/16 0745 01/10/16 1145  GLUCAP 100* 118* 119* 117* 102* 148*    Imaging Dg Chest Port 1 View  01/10/2016  CLINICAL DATA:  Hypoxemia EXAM: PORTABLE CHEST 1 VIEW COMPARISON:  01/06/2016 FINDINGS: Left IJ catheter, tip at the left brachiocephalic vein. Chronic cardiomegaly. Clearing of bilateral airspace disease, likely pulmonary edema given the clinical circumstances. There is pulmonary venous congestion persisting. No pleural effusion or pneumothorax. IMPRESSION: 1. Pulmonary edema seen 4 days ago has improved. 2. Cardiomegaly and venous congestion. Electronically Signed   By: Monte Fantasia M.D.   On: 01/10/2016 08:07     STUDIES:  1/12 CT head >> SAH with IVH 1/12 CTA head >> multiple aneurysms with 7 mm Lt PICA aneurysm as source of SAH  CULTURES:  ANTIBIOTICS: 1/12 Ancef >> 1/13  SIGNIFICANT EVENTS: 1/12 Admit, to OR 1/13 To OR >> suboccipital craniotomy, C1 laminectomy, clipping of posterior circulation aneurysm  LINES/TUBES: 1/12 Lt brachial aline >>> 1/12 Lt IJ CVL >> 1/12 ETT >> 1/14 1/12 IVD >>   DISCUSSION: 55 yo female smoker with altered mental status from [facility] and IVH.  Found to have cerebral aneurysm.  She has hx of HTN, WPW.  ASSESSMENT / PLAN:  NEUROLOGIC A: Acute encephalopathy 2nd to [facility], IVH from posterior circulation aneurysm s/p clipping. P:   Post op care, albumin, mannitol, decadron, AED's, IVD per neurology Can we dc mannitol, albumin? Continue nimotop x 3 weeks Continue Keppra per NS. Continue steroids per NS, likely can reduce IVD per neurosurgery. Keep in the ICU for the vasospasm window per NS.  PULMONARY A: Compromised airway. Hx of tobacco abuse. P:   Oxygen to keep SpO2 > 92%. At risk edema from mannitol  CARDIOVASCULAR A: A fib with RVR >> transient on  admission. Hx of WPW, HTN. Interstitial edema on CXR. P:  Goal SBP < 160 per neurosurgery. PRN labetolol. Echo noted with EF of 55-60% with no evidence of diastolic heart failure. Diuresis limited in setting of SAH Can dc mannitol  / albumin  however  RENAL A: Hyperchloremia At risk CSW, SIADH P:   BMET in AM. NS at 100 ml/hr. Na in am   GASTROINTESTINAL A: Nutrition. Dysphagia. P:   Diet per speech recommendations - need fees, will order Protonix for SUP.  HEMATOLOGIC A: Anemia of critical illness >> no evidence for bleeding. P:  F/u CBC. SCD's for DVT prevention.  INFECTIOUS A: No evidence for infection. P:   Monitor clinically.  ENDOCRINE A: Hyperglycemia. P:   Monitor glucose on chem   Discussed with bedside RN.  Lavon Paganini. Titus Mould, MD, [place] Pgr: [place] Pulmonary & Critical Care

## 2016-01-10 NOTE — Procedures (Addendum)
Objective Swallowing Evaluation: FEES-Fiberoptic Endoscopic Evaluation of Swallow  Patient Details  Name: [name] [name] MRN: [id number] Date of Birth: [birth year]  Today's Date: 01/10/2016 Time: SLP Start Time (ACUTE ONLY): 1508-SLP Stop Time (ACUTE ONLY): 1533 SLP Time Calculation (min) (ACUTE ONLY): 25 min  Past Medical History:  Past Medical History  Diagnosis Date  . SVT (supraventricular tachycardia) ([place])   . Hypertension   . WPW (Wolff-Parkinson-White syndrome)    Past Surgical History:  Past Surgical History  Procedure Laterality Date  . Radiology with anesthesia N/A 01/03/2016    Procedure: RADIOLOGY WITH ANESTHESIA;  Surgeon: Medication Radiologist, MD;  Location: [place];  Service: Radiology;  Laterality: N/A;  . Craniotomy N/A 01/04/2016    Procedure: Suboccipital Craniotomy and Cervical one Laminectomy for Clipping of Aneurysm;  Surgeon: Kevan Ny Ditty, MD;  Location: [place] [facility]UR[facility];  Service: Neurosurgery;  Laterality: N/A;   HPI: 55 yo female smoker with altered mental status from [facility] and IVH. Found to have cerebral aneurysm, intubated 1/12, 1/13 suboccipital craniotomy, C1 laminectomy, clipping of posterior circulation aneurysm, extubated 1/14. Has IVD.   No Data Recorded  Assessment / Plan / Recommendation  CHL IP CLINICAL IMPRESSIONS 01/10/2016  Therapy Diagnosis Mild pharyngeal phase dysphagia  Clinical Impression Patient presents with residual mild pharyngeal dysphagia characterized by trace penetration to the level of the vocal cords occurring 1 x with multiple consecutive straw sips of thin liquids. Otherwise, patient able to fully protect airway. Minimal residuals noted post swallow (largely coating) which clears with spontaneous dry swallows. Note congested cough during exam despite intact airway protection. Patient may advance diet. Recommend continued use of safe swallowing precautions to decrease risk of aspiration which is likely residual post intubation and  should continue to diminish with time off vent.   Impact on safety and function Mild aspiration risk      CHL IP TREATMENT RECOMMENDATION 01/10/2016  Treatment Recommendations Therapy as outlined in treatment plan below     Prognosis 01/06/2016  Prognosis for Safe Diet Advancement Good  Barriers to Reach Goals --  Barriers/Prognosis Comment --    CHL IP DIET RECOMMENDATION 01/10/2016  SLP Diet Recommendations Regular solids;Thin liquid  Liquid Administration via Cup;Straw  Medication Administration Whole meds with liquid  Compensations Slow rate;Small sips/bites  Postural Changes Remain semi-upright after after feeds/meals (Comment)      CHL IP OTHER RECOMMENDATIONS 01/10/2016  Recommended Consults --  Oral Care Recommendations Oral care BID  Other Recommendations --      CHL IP FOLLOW UP RECOMMENDATIONS 01/10/2016  Follow up Recommendations None      CHL IP FREQUENCY AND DURATION 01/06/2016  Speech Therapy Frequency (ACUTE ONLY) min 2x/week  Treatment Duration 2 weeks           CHL IP ORAL PHASE 01/10/2016  Oral Phase WFL  Oral - Pudding Teaspoon --  Oral - Pudding Cup --  Oral - Honey Teaspoon --  Oral - Honey Cup --  Oral - Nectar Teaspoon --  Oral - Nectar Cup --  Oral - Nectar Straw --  Oral - Thin Teaspoon --  Oral - Thin Cup --  Oral - Thin Straw --  Oral - Puree --  Oral - Mech Soft --  Oral - Regular --  Oral - Multi-Consistency --  Oral - Pill --  Oral Phase - Comment --    CHL IP PHARYNGEAL PHASE 01/10/2016  Pharyngeal Phase Impaired  Pharyngeal- Pudding Teaspoon --  Pharyngeal --  Pharyngeal- Pudding Cup --  Pharyngeal --  Pharyngeal- Honey Teaspoon --  Pharyngeal --  Pharyngeal- Honey Cup --  Pharyngeal --  Pharyngeal- Nectar Teaspoon --  Pharyngeal --  Pharyngeal- Nectar Cup Delayed swallow initiation-vallecula;Pharyngeal residue - valleculae;Pharyngeal residue - pyriform  Pharyngeal --  Pharyngeal- Nectar Straw --  Pharyngeal --   Pharyngeal- Thin Teaspoon --  Pharyngeal --  Pharyngeal- Thin Cup Delayed swallow initiation-vallecula;Pharyngeal residue - valleculae;Pharyngeal residue - pyriform  Pharyngeal --  Pharyngeal- Thin Straw Delayed swallow initiation-vallecula;Pharyngeal residue - valleculae;Pharyngeal residue - pyriform;Penetration/Aspiration during swallow;Reduced airway/laryngeal closure  Pharyngeal Material enters airway, CONTACTS cords and not ejected out  Pharyngeal- Puree Delayed swallow initiation-vallecula;Pharyngeal residue - pyriform;Pharyngeal residue - valleculae  Pharyngeal --  Pharyngeal- Mechanical Soft Delayed swallow initiation-vallecula;Pharyngeal residue - valleculae;Pharyngeal residue - pyriform  Pharyngeal --  Pharyngeal- Regular --  Pharyngeal --  Pharyngeal- Multi-consistency --  Pharyngeal --  Pharyngeal- Pill --  Pharyngeal --  Pharyngeal Comment --     CHL IP CERVICAL ESOPHAGEAL PHASE 01/10/2016  Cervical Esophageal Phase WFL  Pudding Teaspoon --  Pudding Cup --  Honey Teaspoon --  Honey Cup --  Nectar Teaspoon --  Nectar Cup --  Nectar Straw --  Thin Teaspoon --  Thin Cup --  Thin Straw --  Puree --  Mechanical Soft --  Regular --  Multi-consistency --  Pill --  Cervical Esophageal Comment --    No flowsheet data found. [place], CCC-SLP [phone]  Gabriel Rainwater Meryl 01/10/2016, 3:40 PM

## 2016-01-10 NOTE — Progress Notes (Signed)
Pt seen and examined. No issues overnight.  EXAM: Temp:  [98 F (36.7 C)-99.3 F (37.4 C)] 98.1 F (36.7 C) (01/19 0800) Pulse Rate:  [41-166] 65 (01/19 0800) Resp:  [9-21] 9 (01/19 0900) BP: (119-149)/(54-95) 136/73 mmHg (01/19 0900) SpO2:  [40 %-100 %] 100 % (01/19 0800) Arterial Line BP: (94-190)/(60-131) 164/77 mmHg (01/19 0900) Weight:  [88.9 kg (195 lb 15.8 oz)] 88.9 kg (195 lb 15.8 oz) (01/19 0300) Intake/Output      01/18 0701 - 01/19 0700 01/19 0701 - 01/20 0700   P.O. 120    I.V. (mL/kg) 2400 (27) 226.7 (2.5)   Other 100    IV Piggyback 605 100   Total Intake(mL/kg) 3225 (36.3) 326.7 (3.7)   Urine (mL/kg/hr) 1450 (0.7)    Drains 82 (0) 14 (0.1)   Stool 0 (0)    Total Output 1532 14   Net +1693 +312.7        Urine Occurrence 550 x    Stool Occurrence 1 x     Awake and alert Follows commands throughout Full strength Incision c/d/i and flat  Stable Continue current care CT Head tomorrow AM, if that looks good will begin weaning EVD

## 2016-01-10 NOTE — Progress Notes (Signed)
Speech Language Pathology Treatment: Dysphagia  Patient Details Name: [name] [name] MRN: [id number] DOB: 23-Oct-1961 Today's Date: 01/10/2016 Time: 1050-1109 SLP Time Calculation (min) (ACUTE ONLY): 19 min  Assessment / Plan / Recommendation Clinical Impression  Treatment focused on readiness to advance diet. Po trials provided at bedside with notable congested cough across liquids consistencies, improving minimally following intake of solids and also notably to a small extent at baseline. Per notes, patient an active smoker likely contributing to cough. Baseline cough however makes bedside diagnostics challenging. Strongly suspect that patient is ready to advance diet however given risk factors for aspiration including neuro/mentation changes and intubation, recommend instrumental testing to determine least restrictive diet. Will proceed with FEES this pm. Patient and RN in agreement.    HPI HPI: 55 yo female smoker with altered mental status from [facility] and IVH. Found to have cerebral aneurysm, intubated 1/12, 1/13 suboccipital craniotomy, C1 laminectomy, clipping of posterior circulation aneurysm, extubated 1/14. Has IVD.       SLP Plan   (FEES)     Recommendations  Diet recommendations: Dysphagia 3 (mechanical soft);Nectar-thick liquid Liquids provided via: Cup Medication Administration: Whole meds with puree Supervision: Staff to assist with self feeding Compensations: Slow rate;Small sips/bites Postural Changes and/or Swallow Maneuvers: Seated upright 90 degrees             Oral Care Recommendations: Oral care BID Plan:  (FEES)     [place], CCC-SLP [phone]   Khyran Riera Meryl 01/10/2016, 11:15 AM

## 2016-01-10 NOTE — Progress Notes (Signed)
Pt's belongings from [facility] went straight to [facility] security when she arrived here on 01/03/16. Security official brought them up to the pt's room [phone]) today to verify contents. She acknowledged items and had the official take them back down to security to hold until she is discharged.  A list was placed in the pt's chart.

## 2016-01-11 ENCOUNTER — Encounter (HOSPITAL_COMMUNITY): Payer: Self-pay | Admitting: Radiology

## 2016-01-11 ENCOUNTER — Inpatient Hospital Stay (HOSPITAL_COMMUNITY): Payer: BLUE CROSS/BLUE SHIELD

## 2016-01-11 DIAGNOSIS — E872 Acidosis: Secondary | ICD-10-CM

## 2016-01-11 DIAGNOSIS — Z452 Encounter for adjustment and management of vascular access device: Secondary | ICD-10-CM

## 2016-01-11 DIAGNOSIS — J9600 Acute respiratory failure, unspecified whether with hypoxia or hypercapnia: Secondary | ICD-10-CM

## 2016-01-11 DIAGNOSIS — N289 Disorder of kidney and ureter, unspecified: Secondary | ICD-10-CM | POA: Insufficient documentation

## 2016-01-11 DIAGNOSIS — R0902 Hypoxemia: Secondary | ICD-10-CM | POA: Insufficient documentation

## 2016-01-11 LAB — GLUCOSE, CAPILLARY
Glucose-Capillary: 95 mg/dL (ref 65–99)
Glucose-Capillary: 110 mg/dL — ABNORMAL HIGH (ref 65–99)
Glucose-Capillary: 119 mg/dL — ABNORMAL HIGH (ref 65–99)
Glucose-Capillary: 119 mg/dL — ABNORMAL HIGH (ref 65–99)
Glucose-Capillary: 116 mg/dL — ABNORMAL HIGH (ref 65–99)

## 2016-01-11 MED ORDER — LEVETIRACETAM 500 MG PO TABS
500.0000 mg | ORAL_TABLET | Freq: Two times a day (BID) | ORAL | Status: DC
  Administered 2016-01-11 – 2016-01-17 (×13): 500 mg via ORAL
  Filled 2016-01-11 (×13): qty 1

## 2016-01-11 MED ORDER — PANTOPRAZOLE SODIUM 40 MG PO TBEC
40.0000 mg | DELAYED_RELEASE_TABLET | Freq: Every day | ORAL | Status: DC
  Administered 2016-01-11 – 2016-01-16 (×6): 40 mg via ORAL
  Filled 2016-01-11 (×6): qty 1

## 2016-01-11 NOTE — Progress Notes (Signed)
Speech Language Pathology Treatment: Dysphagia  Patient Details Name: [name] [name] MRN: [id number] DOB: [birth year] Today's Date: 01/11/2016 Time: 7225-7505 SLP Time Calculation (min) (ACUTE ONLY): 10 min  Assessment / Plan / Recommendation Clinical Impression  F/u after yesterday's FEES: pt with occasional baseline cough that is not due to aspiration per FEES results.  Pt observed with consumption of regular foods, thin liquids with adequate attention to bolus, no overt s/s of deficits, improving appetite and ability to self feed.  Initial cues for basic precautions provided; then pt executed independently.  No further SLP f/u needed for dysphagia - our services will sign off.    HPI HPI: 55 yo female smoker with altered mental status from [facility] and IVH. Found to have cerebral aneurysm, intubated 1/12, 1/13 suboccipital craniotomy, C1 laminectomy, clipping of posterior circulation aneurysm, extubated 1/14. Has IVD.       SLP Plan  All goals met     Recommendations  Diet recommendations: Regular;Thin liquid Liquids provided via: Cup Medication Administration: Whole meds with puree Supervision: Patient able to self feed;Staff to assist with self feeding Compensations: Small sips/bites Postural Changes and/or Swallow Maneuvers: Seated upright 90 degrees             Oral Care Recommendations: Oral care BID Plan: All goals met     GO                [name] [name] 01/11/2016, 1:05 PM

## 2016-01-11 NOTE — Progress Notes (Signed)
Pt seen and examined. No issues overnight.  EXAM: Temp:  [97.4 F (36.3 C)-98.4 F (36.9 C)] 98 F (36.7 C) (01/20 0400) Pulse Rate:  [51-82] 61 (01/20 0900) Resp:  [10-15] 11 (01/20 0900) BP: (101-186)/(60-93) 123/60 mmHg (01/20 0900) SpO2:  [98 %-100 %] 100 % (01/20 0900) Arterial Line BP: (91-177)/(62-101) 155/74 mmHg (01/20 0900) Weight:  [89 kg (196 lb 3.4 oz)] 89 kg (196 lb 3.4 oz) (01/20 0500) Intake/Output      01/19 0701 - 01/20 0700 01/20 0701 - 01/21 0700   P.O.     I.V. (mL/kg) 2300 (25.8) 300 (3.4)   Other     IV Piggyback 500    Total Intake(mL/kg) 2800 (31.5) 300 (3.4)   Urine (mL/kg/hr) 450 (0.2) 350 (1.5)   Drains 141 (0.1) 23 (0.1)   Stool     Total Output 591 373   Net +2209 -73        Urine Occurrence 1450 x 1 x    Awake and alert Follows commands throughout Full strength Incision c/d/i and flat  CT Head: Small ventricles, small left convexity hygroma, left PICA infarct as expected  Stable Clamp EVD Repeat CT Sunday morning Hope to get it out this weekend Continue current care

## 2016-01-11 NOTE — Progress Notes (Signed)
PULMONARY / CRITICAL CARE MEDICINE   Name: [name] [name] MRN: [id number] DOB: Apr 16, 1961    ADMISSION DATE:  01/03/2016 CONSULTATION DATE:  01/03/16  REFERRING MD:  Ditty  (neurosurgery)  CHIEF COMPLAINT:  Altered mental status  SUBJECTIVE:  No events  VITAL SIGNS: BP 135/85 mmHg  Pulse 72  Temp(Src) 98.2 F (36.8 C) (Oral)  Resp 12  Ht 5\' 7"  (1.702 m)  Wt 89 kg (196 lb 3.4 oz)  BMI 30.72 kg/m2  SpO2 100%  HEMODYNAMICS: CVP:  [2 mmHg-34 mmHg] 34 mmHg  VENTILATOR SETTINGS:    INTAKE / OUTPUT: I/O last 3 completed shifts: In: 4305 [I.V.:3500; IV [id number] Out: [id number] [Urine:1350; Drains:182]  PHYSICAL EXAMINATION: General: alert and interactive, good spirits Neuro: Moving all ext to command, nonfocal HEENT: IVD in place Cardiovascular:  RRR, Nl S1/S2, -M/R/G. Lungs: cta Abdomen: soft, non tender. Musculoskeletal:  1+ edema.  LABS:  BMET  Recent Labs Lab 01/07/16 0451 01/09/16 0448 01/10/16 0423  NA 143 142 139  K 4.0 4.0 4.3  CL 115* 111 110  CO2 22 22 21*  BUN 21* 24* 23*  CREATININE 1.07* 0.93 0.92  GLUCOSE 113* 123* 112*    Electrolytes  Recent Labs Lab 01/05/16 0348  01/07/16 0451 01/09/16 0448 01/10/16 0423  CALCIUM 8.9  < > 9.2 9.9 9.9  MG 2.0  --   --  2.2 2.2  PHOS 3.9  --   --  3.3 3.2  < > = values in this interval not displayed.  CBC  Recent Labs Lab 01/07/16 0451 01/09/16 0448 01/10/16 0423  WBC 12.0* 13.6* 13.1*  HGB 8.1* 8.8* 8.9*  HCT 25.5* 27.2* 27.9*  PLT 186 246 293    Coag's No results for input(s): APTT, INR in the last 168 hours.  Sepsis Markers No results for input(s): LATICACIDVEN, PROCALCITON, O2SATVEN in the last 168 hours.  ABG  Recent Labs Lab 01/04/16 1722  PHART 7.329*  PCO2ART 38.8  PO2ART 277.0*    Liver Enzymes No results for input(s): AST, ALT, ALKPHOS, BILITOT, ALBUMIN in the last 168 hours.  Cardiac Enzymes No results for input(s): TROPONINI, PROBNP in the last 168  hours.  Glucose  Recent Labs Lab 01/10/16 1614 01/10/16 1918 01/10/16 2343 01/11/16 0341 01/11/16 0913 01/11/16 1143  GLUCAP 113* 123* 141* 95 110* 119*    Imaging Ct Head Wo Contrast  01/11/2016  CLINICAL DATA:  Hydrocephalus. Recent craniotomy. History of multiple intracranial aneurysms. EXAM: CT HEAD WITHOUT CONTRAST TECHNIQUE: Contiguous axial images were obtained from the base of the skull through the vertex without intravenous contrast. COMPARISON:  CT head January 03, 2016 FINDINGS: Interval placement of frontal ventriculostomy catheter via high RIGHT frontal burr hole, distal tip at the level of the LEFT frontal horn of the lateral ventricle. Ventricles are somewhat slit-like compared to prior examination. No hydrocephalus. 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 Diffuse global parenchymal edema with basal cistern effacement. No intraparenchymal hemorrhage. No acute large vascular territory infarct. New 5 mm low-density LEFT holo hemispheric low-density fluid collection, similar trace LEFT posterior fossa fluid collection. 3 mm LEFT-to-RIGHT midline shift. Ocular globes and orbital contents are non-suspicious. Paranasal sinuses and mastoid air cells are well aerated. 2 cm low-density suboccipital fluid collection. IMPRESSION: Interval suboccipital craniotomy for probable PICA aneurysm clipping. 2 cm sub occipital seroma versus pseudomeningocele. Interval placement of RIGHT frontal ventriculostomy catheter, distal tip of the level of the LEFT frontal horn with slit-like ventricles which could be related to over  shunting though, or from patient's global parenchymal edema. New 5 mm LEFT holohemispheric 5 mm low-density hygroma. 3 mm LEFT-to-RIGHT midline shift. Electronically Signed   By: Elon Alas M.D.   On: 01/11/2016 05:21     STUDIES:  1/12 CT head >> SAH with IVH 1/12 CTA head >> multiple aneurysms with 7 mm Lt PICA aneurysm as source of  SAH  CULTURES:  ANTIBIOTICS: 1/12 Ancef >> 1/13  SIGNIFICANT EVENTS: 1/12 Admit, to OR 1/13 To OR >> suboccipital craniotomy, C1 laminectomy, clipping of posterior circulation aneurysm  LINES/TUBES: 1/12 Lt brachial aline >>> 1/12 Lt IJ CVL >> 1/12 ETT >> 1/14 1/12 IVD >>   DISCUSSION: 55 yo female smoker with altered mental status from [facility] and IVH.  Found to have cerebral aneurysm.  She has hx of HTN, WPW.  ASSESSMENT / PLAN:  NEUROLOGIC A: Acute encephalopathy 2nd to [facility], IVH from posterior circulation aneurysm s/p clipping. P:   ivc may be dc Sunday per NS Continue nimotop x 3 weeks Continue Keppra, consider 21 days Would wean steroids to off Keep in the ICU for the vasospasm window per NS, tcd  PULMONARY A: Compromised airway. Hx of tobacco abuse. P:   Oxygen to keep SpO2 > 92%. At risk edema from mannitol  CARDIOVASCULAR A: A fib with RVR >> transient on admission. Hx of WPW, HTN. Interstitial edema on CXR. P:  Goal SBP < 160 per neurosurgery, allow some HTN PRN labetolol. Echo noted with EF of 55-60% with no evidence of diastolic heart failure. Diuresis limited in setting of SAH  RENAL A: Hyperchloremia At risk CSW, SIADH P:   BMET in AM. NS at 100 ml/hr, likely should maintain Na in am   GASTROINTESTINAL A: Nutrition. Dysphagia. P:   Fees ordered Protonix for SUP.  HEMATOLOGIC A: Anemia of critical illness >> no evidence for bleeding. P:  F/u CBC. SCD's for DVT prevention.  INFECTIOUS A: No evidence for infection. Drain  P:   Monitor clinically Dc drain as able, Sunday likely  ENDOCRINE A: Hyperglycemia. P:   Monitor glucose on chem   Discussed with bedside RN. Will revisit Monday, call if needed  Lavon Paganini. Titus Mould, MD, [place] Pgr: [facility]

## 2016-01-12 LAB — GLUCOSE, CAPILLARY
Glucose-Capillary: 109 mg/dL — ABNORMAL HIGH (ref 65–99)
Glucose-Capillary: 110 mg/dL — ABNORMAL HIGH (ref 65–99)
Glucose-Capillary: 113 mg/dL — ABNORMAL HIGH (ref 65–99)
Glucose-Capillary: 143 mg/dL — ABNORMAL HIGH (ref 65–99)
Glucose-Capillary: 143 mg/dL — ABNORMAL HIGH (ref 65–99)
Glucose-Capillary: 159 mg/dL — ABNORMAL HIGH (ref 65–99)
Glucose-Capillary: 160 mg/dL — ABNORMAL HIGH (ref 65–99)

## 2016-01-12 NOTE — Progress Notes (Signed)
Patient ID: [name] [name], female   DOB: 1961-03-19, 55 y.o.   MRN: [id number] Subjective: Patient reports no real headache. She really has no complaints this morning.  Objective: Vital signs in last 24 hours: Temp:  [97.7 F (36.5 C)-98.4 F (36.9 C)] 98.3 F (36.8 C) (01/21 0740) Pulse Rate:  [46-112] 112 (01/21 0900) Resp:  [8-23] 14 (01/21 0900) BP: (122-160)/(60-105) 134/105 mmHg (01/21 0900) SpO2:  [97 %-100 %] 99 % (01/21 0900) Arterial Line BP: (135-224)/(75-215) 166/80 mmHg (01/21 0900) Weight:  [88.9 kg (195 lb 15.8 oz)] 88.9 kg (195 lb 15.8 oz) (01/21 0500)  Intake/Output from previous day: 01/20 0701 - 01/21 0700 In: 2500 [I.V.:2500] Out: 1473 [Urine:1450; Drains:23] Intake/Output this shift: Total I/O In: 100 [I.V.:100] Out: 350 [Urine:350]  awake and alert and conversant seems to move all extremities  Lab Results: Lab Results  Component Value Date   WBC 13.1* 01/10/2016   HGB 8.9* 01/10/2016   HCT 27.9* 01/10/2016   MCV 85.6 01/10/2016   PLT 293 01/10/2016   Lab Results  Component Value Date   INR 1.02 01/03/2016   BMET Lab Results  Component Value Date   NA 139 01/10/2016   K 4.3 01/10/2016   CL 110 01/10/2016   CO2 21* 01/10/2016   GLUCOSE 112* 01/10/2016   BUN 23* 01/10/2016   CREATININE 0.92 01/10/2016   CALCIUM 9.9 01/10/2016    Studies/Results: Ct Head Wo Contrast  01/11/2016  CLINICAL DATA:  Hydrocephalus. Recent craniotomy. History of multiple intracranial aneurysms. EXAM: CT HEAD WITHOUT CONTRAST TECHNIQUE: Contiguous axial images were obtained from the base of the skull through the vertex without intravenous contrast. COMPARISON:  CT head January 03, 2016 FINDINGS: Interval placement of frontal ventriculostomy catheter via high RIGHT frontal burr hole, distal tip at the level of the LEFT frontal horn of the lateral ventricle. Ventricles are somewhat slit-like compared to prior examination. No hydrocephalus. 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 Diffuse global parenchymal edema with basal cistern effacement. No intraparenchymal hemorrhage. No acute large vascular territory infarct. New 5 mm low-density LEFT holo hemispheric low-density fluid collection, similar trace LEFT posterior fossa fluid collection. 3 mm LEFT-to-RIGHT midline shift. Ocular globes and orbital contents are non-suspicious. Paranasal sinuses and mastoid air cells are well aerated. 2 cm low-density suboccipital fluid collection. IMPRESSION: Interval suboccipital craniotomy for probable PICA aneurysm clipping. 2 cm sub occipital seroma versus pseudomeningocele. Interval placement of RIGHT frontal ventriculostomy catheter, distal tip of the level of the LEFT frontal horn with slit-like ventricles which could be related to over shunting though, or from patient's global parenchymal edema. New 5 mm LEFT holohemispheric 5 mm low-density hygroma. 3 mm LEFT-to-RIGHT midline shift. Electronically Signed   By: Elon Alas M.D.   On: 01/11/2016 05:21    Assessment/Plan: Overall stable. Continue current management. CT of head tomorrow.   LOS: 9 days    Keyle Doby S 01/12/2016, 10:40 AM

## 2016-01-12 NOTE — Progress Notes (Signed)
Inpatient Rehabilitation  Patient was screened by Kaleel Schmieder for appropriateness for an Inpatient Acute Rehab consult.  At this time, we are recommending Inpatient Rehab consult.  Please order if you are agreeable.    Enrico Eaddy PT Inpatient Rehab Admissions Coordinator Cell [phone] Office [phone]   

## 2016-01-12 NOTE — Evaluation (Signed)
Physical Therapy Evaluation Patient Details Name: [name] [name] MRN: [id number] DOB: 1961/10/09 Today's Date: 01/12/2016   History of Present Illness  Pt is a 55 yo female smoker with altered mental status from [facility] and IVH. Found to have cerebral aneurysm, intubated 1/12, 1/13 suboccipital craniotomy, C1 laminectomy, clipping of posterior circulation aneurysm, extubated 1/14. Has IVD.   Clinical Impression  Pt admitted with above diagnosis. Pt currently with functional limitations due to the deficits listed below (see PT Problem List). Ms. Noser was Ind w/ all ADLs and ambulation PTA.  She currently requires min assist for safe transfers and ambulation w/ mild impaired cognition.  Recommending CIR. Pt very motivated and continues to express that she wants to do as much as she can each day to be able to go home. Pt will benefit from skilled PT to increase their independence and safety with mobility to allow discharge to the venue listed below.      Follow Up Recommendations CIR    Equipment Recommendations  Other (comment) (TBD)    Recommendations for Other Services Rehab consult;OT consult     Precautions / Restrictions Precautions Precautions: Fall Precaution Comments: IVD in place (clamped) Restrictions Weight Bearing Restrictions: No      Mobility  Bed Mobility Overal bed mobility: Needs Assistance Bed Mobility: Supine to Sit     Supine to sit: Min guard;HOB elevated     General bed mobility comments: HOB elevated and use of bed rails w/ increased time.  Cues to scoot to EOB.  Transfers Overall transfer level: Needs assistance Equipment used: 1 person hand held assist Transfers: Sit to/from [facility] Sit to Stand: Min assist Stand pivot transfers: Min assist       General transfer comment: 1 person HHA to steady and cues for hand placement for technique during sit<>stand.  Pt shuffles feet during stand pivot and cues provided for correct  positioning prior to sitting.  Ambulation/Gait Ambulation/Gait assistance: Min assist;+2 safety/equipment Ambulation Distance (Feet): 10 Feet Assistive device: 1 person hand held assist Gait Pattern/deviations: Decreased stride length;Antalgic;Shuffle;Step-through pattern   Gait velocity interpretation: Below normal speed for age/gender General Gait Details: Pt shuffles feet initially.  1 person HHA for support to steady.  Quick to fatigue and DOE 2/4, SpO2 in 90's.  Stairs            Wheelchair Mobility    Modified Rankin (Stroke Patients Only) Modified Rankin (Stroke Patients Only) Pre-Morbid Rankin Score: No symptoms Modified Rankin: Moderately severe disability     Balance Overall balance assessment: Needs assistance Sitting-balance support: Feet supported;Bilateral upper extremity supported Sitting balance-Leahy Scale: Fair     Standing balance support: Single extremity supported;During functional activity Standing balance-Leahy Scale: Poor Standing balance comment: Min assist to steady                             Pertinent Vitals/Pain Pain Assessment: Faces Faces Pain Scale: Hurts even more Pain Location: neck Pain Descriptors / Indicators: Grimacing;Aching Pain Intervention(s): Limited activity within patient's tolerance;Monitored during session;Repositioned    Home Living Family/patient expects to be discharged to:: Private residence Living Arrangements: Parent;Other relatives (32 y/o grandson) Available Help at Discharge: Family;Available 24 hours/day Type of Home: House Home Access: Stairs to enter Entrance Stairs-Rails:  (pt poor historian and unclear answer on this) Entrance Stairs-Number of Steps: 2 Home Layout: One level Home Equipment: None Additional Comments: Pt lives w/ mother and 43 y/o grandson.  Per  pt mother is in good physical health to assist pt at d/c    Prior Function Level of Independence: Independent                Hand Dominance   Dominant Hand: Right    Extremity/Trunk Assessment   Upper Extremity Assessment: RUE deficits/detail;LUE deficits/detail;Defer to OT evaluation RUE Deficits / Details: strength grossly ~4/5         Lower Extremity Assessment: RLE deficits/detail RLE Deficits / Details: strength grossly 4/5 w/ MMT    Cervical / Trunk Assessment: Other exceptions  Communication   Communication: No difficulties  Cognition Arousal/Alertness: Awake/alert Behavior During Therapy: Flat affect Overall Cognitive Status: Impaired/Different from baseline Area of Impairment: Attention;Safety/judgement;Awareness;Problem solving   Current Attention Level: Sustained     Safety/Judgement: Decreased awareness of safety;Decreased awareness of deficits Awareness: Emergent Problem Solving: Slow processing;Decreased initiation;Difficulty sequencing;Requires verbal cues;Requires tactile cues General Comments: Pt w/ difficulty explaining home layout and requires step by step cues for transfers    General Comments General comments (skin integrity, edema, etc.): BP drops to high 90's/60's once sitting after 140's/60s supine in bed.  Pt denies dizzines but reports fatigue.    Exercises General Exercises - Lower Extremity Long Arc Quad: AROM;Both;10 reps;Seated      Assessment/Plan    PT Assessment Patient needs continued PT services  PT Diagnosis Difficulty walking;Altered mental status;Hemiplegia dominant side;Acute pain   PT Problem List Decreased strength;Decreased activity tolerance;Decreased balance;Decreased mobility;Decreased cognition;Decreased knowledge of use of DME;Decreased safety awareness;Decreased knowledge of precautions;Pain  PT Treatment Interventions DME instruction;Gait training;Functional mobility training;Therapeutic activities;Therapeutic exercise;Stair training;Balance training;Neuromuscular re-education;Cognitive remediation;Patient/family education   PT Goals  (Current goals can be found in the Care Plan section) Acute Rehab PT Goals Patient Stated Goal: to do as much as I can PT Goal Formulation: With patient Time For Goal Achievement: 01/26/16 Potential to Achieve Goals: Good    Frequency Min 4X/week   Barriers to discharge        Co-evaluation               End of Session Equipment Utilized During Treatment: Gait belt Activity Tolerance: Patient limited by fatigue Patient left: in chair;with call bell/phone within reach;with nursing/sitter in room Nurse Communication: Mobility status;Other (comment) (RN assisted w/ lines during ambulation)         Time: [phone] PT Time Calculation (min) (ACUTE ONLY): 38 min   Charges:   PT Evaluation $PT Eval Moderate Complexity: 1 Procedure PT Treatments $Gait Training: 8-22 mins $Therapeutic Activity: 8-22 mins   PT G Codes:       Joslyn Hy PT, DPT [phone] Pager: [phone] 01/12/2016, 9:55 AM

## 2016-01-13 ENCOUNTER — Inpatient Hospital Stay (HOSPITAL_COMMUNITY): Payer: BLUE CROSS/BLUE SHIELD

## 2016-01-13 DIAGNOSIS — J9600 Acute respiratory failure, unspecified whether with hypoxia or hypercapnia: Secondary | ICD-10-CM | POA: Insufficient documentation

## 2016-01-13 DIAGNOSIS — J9602 Acute respiratory failure with hypercapnia: Secondary | ICD-10-CM | POA: Insufficient documentation

## 2016-01-13 LAB — BASIC METABOLIC PANEL
Anion gap: 9 (ref 5–15)
BUN: 20 mg/dL (ref 6–20)
CO2: 21 mmol/L — ABNORMAL LOW (ref 22–32)
Calcium: 9.9 mg/dL (ref 8.9–10.3)
Chloride: 109 mmol/L (ref 101–111)
Creatinine, Ser: 1.01 mg/dL — ABNORMAL HIGH (ref 0.44–1.00)
GFR calc Af Amer: >60 mL/min (ref >60)
GFR calc non Af Amer: >60 mL/min (ref >60)
Glucose, Bld: 116 mg/dL — ABNORMAL HIGH (ref 65–99)
Potassium: 4.2 mmol/L (ref 3.5–5.1)
Sodium: 139 mmol/L (ref 135–145)

## 2016-01-13 LAB — CBC
HCT: 31 % — ABNORMAL LOW (ref 36.0–46.0)
Hemoglobin: 10.1 g/dL — ABNORMAL LOW (ref 12.0–15.0)
MCH: 28.1 pg (ref 26.0–34.0)
MCHC: 32.6 g/dL (ref 30.0–36.0)
MCV: 86.4 fL (ref 78.0–100.0)
Platelets: 359 10*3/uL (ref 150–400)
RBC: 3.59 MIL/uL — ABNORMAL LOW (ref 3.87–5.11)
RDW: 14.8 % (ref 11.5–15.5)
WBC: 23.5 10*3/uL — ABNORMAL HIGH (ref 4.0–10.5)

## 2016-01-13 LAB — PROCALCITONIN: Procalcitonin: <0.1 ng/mL

## 2016-01-13 LAB — HEPATIC FUNCTION PANEL
ALT: 20 U/L (ref 14–54)
AST: 15 U/L (ref 15–41)
Albumin: 5.1 g/dL — ABNORMAL HIGH (ref 3.5–5.0)
Alkaline Phosphatase: 30 U/L — ABNORMAL LOW (ref 38–126)
Bilirubin, Direct: 0.1 mg/dL (ref 0.1–0.5)
Indirect Bilirubin: 0.4 mg/dL (ref 0.3–0.9)
Total Bilirubin: 0.5 mg/dL (ref 0.3–1.2)
Total Protein: 7.2 g/dL (ref 6.5–8.1)

## 2016-01-13 LAB — GLUCOSE, CAPILLARY
Glucose-Capillary: 101 mg/dL — ABNORMAL HIGH (ref 65–99)
Glucose-Capillary: 105 mg/dL — ABNORMAL HIGH (ref 65–99)
Glucose-Capillary: 110 mg/dL — ABNORMAL HIGH (ref 65–99)
Glucose-Capillary: 118 mg/dL — ABNORMAL HIGH (ref 65–99)
Glucose-Capillary: 137 mg/dL — ABNORMAL HIGH (ref 65–99)
Glucose-Capillary: 97 mg/dL (ref 65–99)

## 2016-01-13 LAB — MAGNESIUM: Magnesium: 2.1 mg/dL (ref 1.7–2.4)

## 2016-01-13 LAB — PHOSPHORUS: Phosphorus: 3.6 mg/dL (ref 2.5–4.6)

## 2016-01-13 LAB — TROPONIN I
Troponin I: 0.1 ng/mL — ABNORMAL HIGH (ref <0.031)
Troponin I: 0.4 ng/mL — ABNORMAL HIGH (ref <0.031)
Troponin I: 0.6 ng/mL (ref <0.031)

## 2016-01-13 LAB — LACTIC ACID, PLASMA: Lactic Acid, Venous: 0.9 mmol/L (ref 0.5–2.0)

## 2016-01-13 MED ORDER — IOHEXOL 350 MG/ML SOLN
90.0000 mL | Freq: Once | INTRAVENOUS | Status: AC | PRN
  Administered 2016-01-13: 90 mL via INTRAVENOUS

## 2016-01-13 MED ORDER — FUROSEMIDE 10 MG/ML IJ SOLN
20.0000 mg | Freq: Once | INTRAMUSCULAR | Status: AC
  Administered 2016-01-13: 20 mg via INTRAVENOUS
  Filled 2016-01-13: qty 2

## 2016-01-13 MED ORDER — DEXAMETHASONE SODIUM PHOSPHATE 4 MG/ML IJ SOLN
2.0000 mg | Freq: Four times a day (QID) | INTRAMUSCULAR | Status: DC
  Administered 2016-01-13 – 2016-01-14 (×4): 2 mg via INTRAVENOUS
  Filled 2016-01-13 (×4): qty 1

## 2016-01-13 NOTE — Progress Notes (Signed)
Notified Dr. Ronnald Ramp regarding CT results.  No orders received at this time.  Will continue to monitor patient.

## 2016-01-13 NOTE — Progress Notes (Signed)
CRITICAL VALUE ALERT  Critical value received:  Troponin 0.60  Date of notification:  01/13/2016  Time of notification:  2227  Critical value read back:Yes.    Nurse who received alert:  Matilde Haymaker, RN  MD notified (1st page):  Dr. Elsworth Soho  Time of first page:  2225  Responding MD:  Dr. Elsworth Soho  Time MD responded:  2227  Will continue to monitor patient as directed per Dr. Elsworth Soho.

## 2016-01-13 NOTE — Progress Notes (Signed)
Patient ID: [name] [name], female   DOB: 27-Oct-1961, 55 y.o.   MRN: [id number] Subjective: Patient reports chest tightness this morning. She is getting an EKG and troponin now. No headache.  Objective: Vital signs in last 24 hours: Temp:  [98.5 F (36.9 C)-98.7 F (37.1 C)] 98.7 F (37.1 C) (01/22 0800) Pulse Rate:  [47-77] 49 (01/22 0800) Resp:  [6-24] 10 (01/22 0800) BP: (104-155)/(56-91) 133/59 mmHg (01/22 0800) SpO2:  [93 %-100 %] 95 % (01/22 0800) Arterial Line BP: (144)/(71) 144/71 mmHg (01/21 1000) Weight:  [87.3 kg (192 lb 7.4 oz)] 87.3 kg (192 lb 7.4 oz) (01/22 0322)  Intake/Output from previous day: 01/21 0701 - 01/22 0700 In: 2100 [I.V.:2100] Out: 350 [Urine:350] Intake/Output this shift: Total I/O In: 400 [I.V.:400] Out: -   No change in neurologic exam, she moves all extremities, she is awake and alert and conversant  Lab Results: Lab Results  Component Value Date   WBC 13.1* 01/10/2016   HGB 8.9* 01/10/2016   HCT 27.9* 01/10/2016   MCV 85.6 01/10/2016   PLT 293 01/10/2016   Lab Results  Component Value Date   INR 1.02 01/03/2016   BMET Lab Results  Component Value Date   NA 139 01/10/2016   K 4.3 01/10/2016   CL 110 01/10/2016   CO2 21* 01/10/2016   GLUCOSE 112* 01/10/2016   BUN 23* 01/10/2016   CREATININE 0.92 01/10/2016   CALCIUM 9.9 01/10/2016    Studies/Results: Ct Head Wo Contrast  01/13/2016  CLINICAL DATA:  55 year old female with 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 Subsequent encounter. EXAM: CT HEAD WITHOUT CONTRAST TECHNIQUE: Contiguous axial images were obtained from the base of the skull through the vertex without intravenous contrast. COMPARISON:  01/11/2016 CT and 01/03/2016 CT angiogram. Catheter angiogram 01/03/2016. FINDINGS: Post left occipital craniectomy and clipping of left posterior inferior cerebellar artery aneurysm. Aneurysm clip causes slight impression  upon the left posterior lateral aspect of the medulla. Ventricular shunt enters from the right frontal region with the tip coursing through the left frontal horn and is either tenting the lateral aspect of the left frontal horn of the lateral ventricle versus coursing through the lateral ventricle with the tip in the posterior aspect of the left caudate head. Left subdural collection has decreased in size now best visualized along the convexity. Interval development of relatively isodense broad-based right subdural collection with maximal thickness of 4.8 mm causing mass effect upon the right lateral ventricle with midline shift to the left by 4.4 mm versus prior midline shift to the right by 3 mm. Slight increased size of left atrium will need to be monitored closely to exclude developing hydrocephalus. Left posterior cerebellar infarct. Resolving subarachnoid hemorrhage. Slightly patchy subcortical white matter. Mild brain edema may be present. Slight inferior displacement of the left cerebellar tonsil. Exophthalmos. IMPRESSION: Post therapy changes as noted above. Left subdural collection has decreased in size now best visualized along the convexity. Interval development of relatively isodense broad-based right subdural collection with maximal thickness of 4.8 mm causing mass effect upon the right lateral ventricle with midline shift to the left by 4.4 mm versus prior midline shift to the right by 3 mm. Slight increased size of left atrium will need to be monitored closely to exclude developing hydrocephalus. Left posterior cerebellar infarct. Resolving subarachnoid hemorrhage. Slightly patchy subcortical white matter. Mild brain edema may be present. Slight inferior displacement of the left cerebellar tonsil. These results were called by  telephone at the time of interpretation on 01/13/2016 at 6:47 am to [facility] patinet's nurse who verbally acknowledged these results. Electronically Signed   By: Genia Del M.D.    On: 01/13/2016 07:15    Assessment/Plan: CT scan reviewed. Small right hypodense extra-axial fluid collection that mirrors the left sidded one that was there yesterday but has now resolved. I am not quite sure what to make of this given that her ventriculostomy has been clamped for 3 days. Otherwise I would suspect over drainage. Continue to monitor for now. Cardiac workup negative may need CT scan of the chest to rule out PE   LOS: 10 days    Corinna Burkman S 01/13/2016, 9:21 AM

## 2016-01-13 NOTE — Progress Notes (Signed)
[place] Progress Note Patient Name: CYNCERE MOREA DOB: 1961/04/26 MRN: [id number]   Date of Service  01/13/2016  HPI/Events of Note  D 11 CVL Pt on [place]  eICU Interventions  Dc CVL     Intervention Category Intermediate Interventions: Other:  ALVA,RAKESH V. 01/13/2016, 3:28 PM

## 2016-01-13 NOTE — Progress Notes (Signed)
PULMONARY / CRITICAL CARE MEDICINE   Name: [name] [name] MRN: [id number] DOB: 1961-11-30    ADMISSION DATE:  01/03/2016 CONSULTATION DATE:  01/03/16  REFERRING MD:  Ditty  (neurosurgery)  CHIEF COMPLAINT:  Altered mental status  SUBJECTIVE:  No events  VITAL SIGNS: BP 134/64 mmHg  Pulse 60  Temp(Src) 98.7 F (37.1 C) (Oral)  Resp 10  Ht 5\' 7"  (1.702 m)  Wt 192 lb 7.4 oz (87.3 kg)  BMI 30.14 kg/m2  SpO2 91% 100% NRB  HEMODYNAMICS:    VENTILATOR SETTINGS:    INTAKE / OUTPUT: I/O last 3 completed shifts: In: 3300 [I.V.:3300] Out: 650 [Urine:650]  PHYSICAL EXAMINATION: General: alert and interactive, anxious  Neuro: Moving all ext to command, nonfocal HEENT: IVD in place Cardiovascular:  RRR, Nl S1/S2, -M/R/G. Lungs: cta, no wheeze Abdomen: soft, non tender. Musculoskeletal:  1+ edema.  LABS:  BMET  Recent Labs Lab 01/07/16 0451 01/09/16 0448 01/10/16 0423  NA 143 142 139  K 4.0 4.0 4.3  CL 115* 111 110  CO2 22 22 21*  BUN 21* 24* 23*  CREATININE 1.07* 0.93 0.92  GLUCOSE 113* 123* 112*    Electrolytes  Recent Labs Lab 01/07/16 0451 01/09/16 0448 01/10/16 0423  CALCIUM 9.2 9.9 9.9  MG  --  2.2 2.2  PHOS  --  3.3 3.2    CBC  Recent Labs Lab 01/07/16 0451 01/09/16 0448 01/10/16 0423  WBC 12.0* 13.6* 13.1*  HGB 8.1* 8.8* 8.9*  HCT 25.5* 27.2* 27.9*  PLT 186 246 293    Coag's No results for input(s): APTT, INR in the last 168 hours.  Sepsis Markers No results for input(s): LATICACIDVEN, PROCALCITON, O2SATVEN in the last 168 hours.  ABG No results for input(s): PHART, PCO2ART, PO2ART in the last 168 hours.  Liver Enzymes No results for input(s): AST, ALT, ALKPHOS, BILITOT, ALBUMIN in the last 168 hours.  Cardiac Enzymes No results for input(s): TROPONINI, PROBNP in the last 168 hours.  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 Subsequent encounter. EXAM: CT HEAD WITHOUT CONTRAST TECHNIQUE: Contiguous axial images were obtained from the base of the skull through the vertex without intravenous contrast. COMPARISON:  01/11/2016 CT and 01/03/2016 CT angiogram. Catheter angiogram 01/03/2016. FINDINGS: Post left occipital craniectomy and clipping of left posterior inferior cerebellar artery aneurysm. Aneurysm clip causes slight impression upon the left posterior lateral aspect of the medulla. Ventricular shunt enters from the right frontal region with the tip coursing through the left frontal horn and is either tenting the lateral aspect of the left frontal horn of the lateral ventricle versus coursing through the lateral ventricle with the tip in the posterior aspect of the left caudate head. Left subdural collection has decreased in size now best visualized along the convexity. Interval development of relatively isodense broad-based right subdural collection with maximal thickness of 4.8 mm causing mass effect upon the right lateral ventricle with midline shift to the left by 4.4 mm versus prior midline shift to the right by 3 mm. Slight increased size of left atrium will need to be monitored closely to exclude developing hydrocephalus. Left posterior cerebellar infarct. Resolving subarachnoid hemorrhage. Slightly patchy subcortical white matter. Mild brain edema may be present.  Slight inferior displacement of the left cerebellar tonsil. Exophthalmos. IMPRESSION: Post therapy changes as noted above. Left subdural collection has decreased in size now best visualized along the convexity. Interval development of relatively isodense broad-based right subdural collection with maximal thickness of 4.8 mm causing mass effect upon the right  lateral ventricle with midline shift to the left by 4.4 mm versus prior midline shift to the right by 3 mm. Slight increased size of left atrium will need to be monitored closely to exclude developing hydrocephalus. Left posterior cerebellar infarct. Resolving subarachnoid hemorrhage. Slightly patchy subcortical white matter. Mild brain edema may be present. Slight inferior displacement of the left cerebellar tonsil. These results were called by telephone at the time of interpretation on 01/13/2016 at 6:47 am to [facility] patinet's nurse who verbally acknowledged these results. Electronically Signed   By: Genia Del M.D.   On: 01/13/2016 07:15     STUDIES:  1/12 CT head >> SAH with IVH 1/12 CTA head >> multiple aneurysms with 7 mm Lt PICA aneurysm as source of [facility] 1/22 ct head: Post therapy changes as noted above.Left subdural collection has decreased in size now best visualized along the convexity.Interval development of relatively isodense broad-based right subdural collection with maximal thickness of 4.8 mm causing mass effect upon the right lateral ventricle with midline shift to the left by 4.4 mm versus prior midline shift to the right by 3 mm. Slight increased size of left atrium will need to be monitored closely to exclude developing hydrocephalus.Left posterior cerebellar infarct. Resolving subarachnoid hemorrhage. Slightly patchy subcortical white matter. Mild brain edema may be present. Slight inferior displacement of the left cerebellar tonsil.  CULTURES:  ANTIBIOTICS: 1/12 Ancef >> 1/13  SIGNIFICANT EVENTS: 1/12 Admit, to OR 1/13 To OR >> suboccipital craniotomy, C1 laminectomy, clipping of posterior circulation aneurysm  LINES/TUBES: 1/12 Lt brachial aline >>> 1/12 Lt IJ CVL >> 1/12 ETT >> 1/14 1/12 IVD >>   DISCUSSION: 55 yo female smoker with altered mental status from [facility] and IVH.  Found to have cerebral aneurysm. S/p clipping on 1/13. Developed acute hypoxia w/ CP on  1/22 so PCCM returning to evaluate. CT chest ordered. Initial ECG nml.   She has hx of HTN, WPW.  ASSESSMENT / PLAN:  NEUROLOGIC A: Acute encephalopathy 2nd to [facility], IVH from posterior circulation aneurysm s/p clipping. Concern about right sided hypo-dense fluid collection on CT head. Neuro-surg watching P:   cnt serial neuro checks Continue nimotop x 3 weeks Continue Keppra, consider 21 days Would wean steroids to off Keep in the ICU for the vasospasm window per NS, tcd  PULMONARY A: Acute Hypoxic respiratory failure: etiology unclear. Currently requiring 100% NRB and sats still as low as 87%. No wheezing to suggest bronchospasm. Doesn't look like her CXR findings explain this; raising concern for PE Hx of tobacco abuse. P:   CT angio of chest LE dopplers F/u CEs Oxygen to keep SpO2 > 92%.  CARDIOVASCULAR A: A fib with RVR >> transient on admission. Hx of WPW, HTN. Chest pain. ECG nml 1/22 Echo noted with EF of 55-60% with no evidence of diastolic heart failure. P:  Goal SBP < 160 per neurosurgery, allow some HTN PRN labetolol. Cycle CEs Diuresis limited in setting of SAH  RENAL A: At risk CSW, SIADH P:   BMET in AM. NS at 100 ml/hr; cont   GASTROINTESTINAL A: Nutrition. Dysphagia P:   Cont aspiration precautions per SLP  HEMATOLOGIC A: Anemia of critical illness >>  no evidence for bleeding. P:  F/u CBC. SCD's for DVT prevention.  INFECTIOUS A: No evidence for infection. Drain  P:   Monitor clinically Dc drain as able, Sunday likely  ENDOCRINE A: Hyperglycemia. P:   Monitor glucose on chem   Discussed with bedside RN.  Hypoxia is out of proportion to CXR changes. Will CT chest. If find PE will need to d/w neuro-surg. Likely will need filter.   Erick Colace ACNP-BC [place] Pager # [phone] OR # [phone] if no answer

## 2016-01-13 NOTE — Progress Notes (Signed)
Patient started to complain of chest tightness, and short of breath.  Oxygen level dropped on 6L of O2.  Nonrebreather initiated.  Dr. Ronnald Ramp and Dr. Chase Caller notified.  Orders received for stat EKG, troponin q6h x3, chest x-ray and CMP.  Will continue to monitor patient.

## 2016-01-13 NOTE — Progress Notes (Signed)
eLink Physician-Brief Progress Note Patient Name: [name] [name] DOB: 1961/10/25 MRN: [id number]   Date of Service  01/13/2016  HPI/Events of Note  SAH Pos trop  eICU Interventions  Await 3rd draw to establish trend     Intervention Category Intermediate Interventions: Diagnostic test evaluation  ALVA,RAKESH V. 01/13/2016, 4:56 PM

## 2016-01-14 ENCOUNTER — Ambulatory Visit (HOSPITAL_COMMUNITY): Payer: BLUE CROSS/BLUE SHIELD

## 2016-01-14 DIAGNOSIS — I609 Nontraumatic subarachnoid hemorrhage, unspecified: Secondary | ICD-10-CM | POA: Insufficient documentation

## 2016-01-14 DIAGNOSIS — R079 Chest pain, unspecified: Secondary | ICD-10-CM

## 2016-01-14 DIAGNOSIS — R0789 Other chest pain: Secondary | ICD-10-CM

## 2016-01-14 LAB — BASIC METABOLIC PANEL
Anion gap: 11 (ref 5–15)
BUN: 23 mg/dL — ABNORMAL HIGH (ref 6–20)
CO2: 19 mmol/L — ABNORMAL LOW (ref 22–32)
Calcium: 9.9 mg/dL (ref 8.9–10.3)
Chloride: 109 mmol/L (ref 101–111)
Creatinine, Ser: 0.99 mg/dL (ref 0.44–1.00)
GFR calc Af Amer: >60 mL/min (ref >60)
GFR calc non Af Amer: >60 mL/min (ref >60)
Glucose, Bld: 132 mg/dL — ABNORMAL HIGH (ref 65–99)
Potassium: 4.1 mmol/L (ref 3.5–5.1)
Sodium: 139 mmol/L (ref 135–145)

## 2016-01-14 LAB — GLUCOSE, CAPILLARY
Glucose-Capillary: 105 mg/dL — ABNORMAL HIGH (ref 65–99)
Glucose-Capillary: 127 mg/dL — ABNORMAL HIGH (ref 65–99)
Glucose-Capillary: 127 mg/dL — ABNORMAL HIGH (ref 65–99)
Glucose-Capillary: 134 mg/dL — ABNORMAL HIGH (ref 65–99)
Glucose-Capillary: 137 mg/dL — ABNORMAL HIGH (ref 65–99)

## 2016-01-14 LAB — PROCALCITONIN: Procalcitonin: <0.1 ng/mL

## 2016-01-14 LAB — MAGNESIUM: Magnesium: 2.2 mg/dL (ref 1.7–2.4)

## 2016-01-14 MED ORDER — FLUDROCORTISONE ACETATE 0.1 MG PO TABS
0.1000 mg | ORAL_TABLET | Freq: Every day | ORAL | Status: DC
  Administered 2016-01-14 – 2016-01-16 (×3): 0.1 mg via ORAL
  Filled 2016-01-14 (×3): qty 1

## 2016-01-14 MED ORDER — SODIUM CHLORIDE 0.9 % IJ SOLN
10.0000 mL | Freq: Two times a day (BID) | INTRAMUSCULAR | Status: DC
  Administered 2016-01-14 – 2016-01-16 (×4): 10 mL

## 2016-01-14 MED ORDER — NOREPINEPHRINE BITARTRATE 1 MG/ML IV SOLN
2.0000 ug/min | INTRAVENOUS | Status: DC
  Administered 2016-01-14: 2 ug/min via INTRAVENOUS
  Administered 2016-01-15 (×2): 12 ug/min via INTRAVENOUS
  Administered 2016-01-15: 9 ug/min via INTRAVENOUS
  Administered 2016-01-15: 11 ug/min via INTRAVENOUS
  Administered 2016-01-16: 8 ug/min via INTRAVENOUS
  Filled 2016-01-14 (×6): qty 4

## 2016-01-14 MED ORDER — ATROPINE SULFATE 0.1 MG/ML IJ SOLN
INTRAMUSCULAR | Status: AC
  Filled 2016-01-14: qty 10

## 2016-01-14 MED ORDER — DOPAMINE-DEXTROSE 3.2-5 MG/ML-% IV SOLN
INTRAVENOUS | Status: AC
  Filled 2016-01-14: qty 250

## 2016-01-14 MED ORDER — DOPAMINE-DEXTROSE 3.2-5 MG/ML-% IV SOLN
0.0000 ug/kg/min | INTRAVENOUS | Status: DC
  Administered 2016-01-14: 5 ug/kg/min via INTRAVENOUS
  Administered 2016-01-15: 8 ug/kg/min via INTRAVENOUS
  Filled 2016-01-14 (×3): qty 250

## 2016-01-14 MED ORDER — SODIUM CHLORIDE 0.9 % IJ SOLN: 10.0000 mL | INTRAMUSCULAR | Status: DC | PRN

## 2016-01-14 MED ORDER — DEXAMETHASONE SODIUM PHOSPHATE 4 MG/ML IJ SOLN
4.0000 mg | Freq: Four times a day (QID) | INTRAMUSCULAR | Status: DC
  Administered 2016-01-14 – 2016-01-15 (×4): 4 mg via INTRAVENOUS
  Filled 2016-01-14 (×4): qty 1

## 2016-01-14 NOTE — Progress Notes (Signed)
Physical Therapy Treatment Patient Details Name: [name] [name] MRN: [id number] DOB: 1961/06/17 Today's Date: 01/14/2016    History of Present Illness Pt is a 55 yo female smoker with altered mental status from [facility] and IVH. Found to have cerebral aneurysm, intubated 1/12, 1/13 suboccipital craniotomy, C1 laminectomy, clipping of posterior circulation aneurysm, extubated 1/14. Has IVD.     PT Comments    Pt able to increase mobility today.  Pt able to perform own peri hygiene after using 3-in-1 and then ambulated in to hallway.  2nd person present for session due to equipment (IV pole, IVC pole, and chair follow).  Feel pt would do great at CIR.  Will continue to follow.    Follow Up Recommendations  CIR     Equipment Recommendations  None recommended by PT    Recommendations for Other Services       Precautions / Restrictions Precautions Precautions: Fall Precaution Comments: IVC currently clamped Restrictions Weight Bearing Restrictions: No    Mobility  Bed Mobility Overal bed mobility: Needs Assistance Bed Mobility: Supine to Sit     Supine to sit: Min assist;HOB elevated     General bed mobility comments: A with bringing trunk up to sitting.    Transfers Overall transfer level: Needs assistance Equipment used: 1 person hand held assist Transfers: Sit to/from Stand Sit to Stand: Min assist;+2 safety/equipment         General transfer comment: pt able to come to stand with only cues for technique, but needs 2nd person to manage lines.  Ambulation/Gait Ambulation/Gait assistance: Min assist;+2 safety/equipment Ambulation Distance (Feet): 35 Feet Assistive device: 1 person hand held assist Gait Pattern/deviations: Step-through pattern;Decreased stride length     General Gait Details: pt shuffles, but was able to ambulate out in hall.  2nd person present to manage IV pole, IVC pole, and chair follow.     Stairs            Wheelchair Mobility     Modified Rankin (Stroke Patients Only) Modified Rankin (Stroke Patients Only) Pre-Morbid Rankin Score: No symptoms Modified Rankin: Moderately severe disability     Balance Overall balance assessment: Needs assistance Sitting-balance support: No upper extremity supported;Feet supported Sitting balance-Leahy Scale: Fair     Standing balance support: During functional activity Standing balance-Leahy Scale: Poor                      Cognition Arousal/Alertness: Awake/alert Behavior During Therapy: Flat affect Overall Cognitive Status: Impaired/Different from baseline Area of Impairment: Attention;Safety/judgement;Awareness;Problem solving   Current Attention Level: Selective     Safety/Judgement: Decreased awareness of safety;Decreased awareness of deficits Awareness: Emergent Problem Solving: Slow processing;Decreased initiation;Difficulty sequencing;Requires verbal cues;Requires tactile cues      Exercises      General Comments        Pertinent Vitals/Pain Pain Assessment: Faces Faces Pain Scale: Hurts little more Pain Location: Head Pain Descriptors / Indicators: Grimacing;Headache Pain Intervention(s): Monitored during session;Premedicated before session;Repositioned    Home Living                      Prior Function            PT Goals (current goals can now be found in the care plan section) Acute Rehab PT Goals Patient Stated Goal: to do as much as I can PT Goal Formulation: With patient Time For Goal Achievement: 01/26/16 Potential to Achieve Goals: Good Progress towards PT goals: Progressing toward  goals    Frequency  Min 4X/week    PT Plan Current plan remains appropriate    Co-evaluation             End of Session Equipment Utilized During Treatment: Gait belt Activity Tolerance: Patient limited by fatigue Patient left: in chair;with call bell/phone within reach;with chair alarm set     Time: [phone] PT Time  Calculation (min) (ACUTE ONLY): 26 min  Charges:  $Gait Training: 8-22 mins $Therapeutic Activity: 8-22 mins                    G CodesCatarina Hartshorn, [place] 01/14/2016, 3:16 PM

## 2016-01-14 NOTE — Progress Notes (Signed)
  Echocardiogram 2D Echocardiogram has been performed.  [name] [name] 01/14/2016, 5:39 PM

## 2016-01-14 NOTE — Progress Notes (Signed)
eLink Physician-Brief Progress Note Patient Name: [name] [name] DOB: 1961/05/30 MRN: [id number]   Date of Service  01/14/2016  HPI/Events of Note  Sinus Bradycardia - HR in 40's and occasionally dipping into the 20's. Troponin positive in setting of SAH.  eICU Interventions  Will order: 1. Dopamine IV infusion 0 - 10 mcg/kg/min. Titrate to HR > 60.  2. Atropine 1 mg to bedside at all times.  3. Check BMP and Mg++ now.      Intervention Category Major Interventions: Arrhythmia - evaluation and management  Sommer,Steven Eugene 01/14/2016, 6:23 AM

## 2016-01-14 NOTE — Progress Notes (Signed)
Pt seen and examined. No issues overnight.  EXAM: Temp:  [97.8 F (36.6 C)-98.5 F (36.9 C)] 97.8 F (36.6 C) (01/23 0800) Pulse Rate:  [33-96] 54 (01/23 0730) Resp:  [8-25] 18 (01/23 0730) BP: (106-147)/(35-111) 130/54 mmHg (01/23 0730) SpO2:  [91 %-100 %] 100 % (01/23 0730) Weight:  [87.5 kg (192 lb 14.4 oz)] 87.5 kg (192 lb 14.4 oz) (01/23 0302) Intake/Output      01/22 0701 - 01/23 0700 01/23 0701 - 01/24 0700   I.V. (mL/kg) 2600 (29.7)    Total Intake(mL/kg) 2600 (29.7)    Urine (mL/kg/hr) 1300 (0.6)    Stool 0 (0)    Total Output 1300     Net +1300          Urine Occurrence 8 x    Stool Occurrence 2 x     Awake and alert Follows commands throughout Full strength Incision c/d/i  Stable D/c EVD Stay in ICU for vasospasm monitoring

## 2016-01-14 NOTE — Progress Notes (Signed)
Nutrition Follow-up  INTERVENTION:   Continue Ensure Enlive po BID as needed.   NUTRITION DIAGNOSIS:   Inadequate oral intake related to lethargy/confusion as evidenced by meal completion < 50%. Progressing.   GOAL:   Patient will meet greater than or equal to 90% of their needs Not yet met.   MONITOR:   PO intake, Supplement acceptance, Diet advancement, Labs, I & O's  ASSESSMENT:   55yo female with hx HTN, WPW syndrome admitted 1/12 with SAH in setting multiple aneurysms s/p ventriculostomy.   Pt discussed during ICU rounds and with RN.  Drain out, plan to stay in ICU for vasospasm monitoring.  CBG's: 105-127 1/14 Extubated 1/15 Dysphagia 3/Nectar diet started 1/19 Diet advanced to Regular Per RN pt eating better, meals not recorded. Snickers at bedside.   Diet Order:  Diet regular Room service appropriate?: Yes; Fluid consistency:: Thin  Skin:  Reviewed, no issues  Last BM:  1/22  Height:   Ht Readings from Last 1 Encounters:  01/03/16 '5\' 7"'  (1.702 m)   Weight:   Wt Readings from Last 1 Encounters:  01/14/16 192 lb 14.4 oz (87.5 kg)   Ideal Body Weight:  61.3 kg  BMI:  Body mass index is 30.21 kg/(m^2).  Estimated Nutritional Needs:   Kcal:  1700-1900  Protein:  85-100 grams  Fluid:  > 1.7 L/day  EDUCATION NEEDS:   No education needs identified at this time  [employer], [place], [place] Pager [phone] After Hours Pager

## 2016-01-14 NOTE — Progress Notes (Addendum)
PULMONARY / CRITICAL CARE MEDICINE   Name: [name] [name] MRN: [id number] DOB: 1961-11-16    ADMISSION DATE:  01/03/2016 CONSULTATION DATE:  01/03/16  REFERRING MD:  Ditty (neurosurgery)  CHIEF COMPLAINT:  Altered mental status  SUBJECTIVE:  Bradycardic overnight into the 20s with some drowsiness, dopamine infusion started  Mildly elevated troponin at 0.6 Hypotension relative  VITAL SIGNS: BP 130/54 mmHg  Pulse 54  Temp(Src) 97.9 F (36.6 C) (Oral)  Resp 18  Ht 5\' 7"  (1.702 m)  Wt 192 lb 14.4 oz (87.5 kg)  BMI 30.21 kg/m2  SpO2 100% 100% NRB  HEMODYNAMICS:    VENTILATOR SETTINGS:    INTAKE / OUTPUT: I/O last 3 completed shifts: In: 3500 [I.V.:3500] Out: 1300 [Urine:1300]  PHYSICAL EXAMINATION: General: Lying in bed, awake, calm, NAD Neuro: Nonfocal, grip strength equal, 5/5 strength bilateral extremities  HEENT: Pupils equal and minimally reactive, pinpoint. Mucous membranes dry  Cardiovascular:  Bradycardic, regular. No murmurs, rubs or gallops  Lungs: Clear and equal bilaterally, no wheezes.  Abdomen: Soft, non-tender. BS x 4.  Musculoskeletal: Moves all extremities, no acute deformities. No edema.   LABS:  BMET  Recent Labs Lab 01/09/16 0448 01/10/16 0423 01/13/16 0940  NA 142 139 139  K 4.0 4.3 4.2  CL 111 110 109  CO2 22 21* 21*  BUN 24* 23* 20  CREATININE 0.93 0.92 1.01*  GLUCOSE 123* 112* 116*    Electrolytes  Recent Labs Lab 01/09/16 0448 01/10/16 0423 01/13/16 0940  CALCIUM 9.9 9.9 9.9  MG 2.2 2.2 2.1  PHOS 3.3 3.2 3.6    CBC  Recent Labs Lab 01/09/16 0448 01/10/16 0423 01/13/16 0940  WBC 13.6* 13.1* 23.5*  HGB 8.8* 8.9* 10.1*  HCT 27.2* 27.9* 31.0*  PLT 246 293 359    Coag's No results for input(s): APTT, INR in the last 168 hours.  Sepsis Markers  Recent Labs Lab 01/13/16 0940 01/14/16 0320  LATICACIDVEN 0.9  --   PROCALCITON <0.10 <0.10    ABG No results for input(s): PHART, PCO2ART, PO2ART in the last  168 hours.  Liver Enzymes  Recent Labs Lab 01/13/16 0940  AST 15  ALT 20  ALKPHOS 30*  BILITOT 0.5  ALBUMIN 5.1*    Cardiac Enzymes  Recent Labs Lab 01/13/16 0920 01/13/16 1520 01/13/16 2136  TROPONINI 0.10* 0.40* 0.60*    Glucose  Recent Labs Lab 01/13/16 0747 01/13/16 1155 01/13/16 1608 01/13/16 1911 01/13/16 2345 01/14/16 0308  GLUCAP 101* 97 118* 137* 110* 105*    Imaging Ct Angio Chest Pe W/cm &/or Wo Cm  01/13/2016  CLINICAL DATA:  Shortness of breath and intracranial hemorrhage. EXAM: CT ANGIOGRAPHY CHEST WITH CONTRAST TECHNIQUE: Multidetector CT imaging of the chest was performed using the standard protocol during bolus administration of intravenous contrast. Multiplanar CT image reconstructions and MIPs were obtained to evaluate the vascular anatomy. CONTRAST:  62mL OMNIPAQUE IOHEXOL 350 MG/ML SOLN COMPARISON:  Chest x-ray earlier today. FINDINGS: The pulmonary arteries are adequately opacified. There is no evidence of pulmonary embolism. The thoracic aorta is not well opacified. There is no evidence to suggest thoracic aortic aneurysmal disease. The heart size is at the upper limits of normal. No pericardial effusion is seen. There are small bilateral pleural effusions. Lungs show patchy areas of airspace opacification in the upper lobes bilaterally. Although some of these areas are likely representative of atelectasis, subtle infiltrate or early alveolar edema cannot be excluded. Both lung bases demonstrate atelectasis. There is some reflux of contrast  into the IVC and hepatic veins suggestive of right heart failure. No bony abnormalities are seen. No evidence of masses or lymphadenopathy. Review of the MIP images confirms the above findings. IMPRESSION: 1. No evidence of pulmonary embolism. 2. Potential mild pulmonary edema with small bilateral pleural effusions. 3. Reflux of contrast into the IVC and hepatic veins is suggestive of right heart failure.  Electronically Signed   By: Aletta Edouard M.D.   On: 01/13/2016 13:08   Dg Chest Port 1 View  01/13/2016  CLINICAL DATA:  Chest tightness this morning. EXAM: PORTABLE CHEST 1 VIEW COMPARISON:  Nineteen 2017 FINDINGS: Left internal jugular approach central venous catheter is unchanged, tip at the expected location of the bifurcation of superior vena cava and the innominate vein. The cardiac silhouette is mildly enlarged. Mediastinal contours appear intact. There is no evidence of focal airspace consolidation, pleural effusion or pneumothorax. Osseous structures are without acute abnormality. Soft tissues are grossly normal. IMPRESSION: Mildly enlarged cardiac silhouette, without evidence of focal airspace consolidation or pulmonary edema. Electronically Signed   By: Fidela Salisbury M.D.   On: 01/13/2016 10:08     STUDIES:  1/12 CT head >> SAH with IVH 1/12 CTA head >> multiple aneurysms with 7 mm Lt PICA aneurysm as source of [facility] 1/22 ct head: Post therapy changes as noted above.Left subdural collection has decreased in size now best visualized along the convexity.Interval development of relatively isodense broad-based right subdural collection with maximal thickness of 4.8 mm causing mass effect upon the right lateral ventricle with midline shift to the left by 4.4 mm versus prior midline shift to the right by 3 mm. Slight increased size of left atrium will need to be monitored closely to exclude developing hydrocephalus.Left posterior cerebellar infarct. Resolving subarachnoid hemorrhage. Slightly patchy subcortical white matter. Mild brain edema may be present. Slight inferior displacement of the left cerebellar tonsil.  CULTURES:  ANTIBIOTICS: 1/12 Ancef >> 1/13  SIGNIFICANT EVENTS: 1/12 Admit, to OR 1/13 To OR >> suboccipital craniotomy, C1 laminectomy, clipping of posterior circulation aneurysm  LINES/TUBES: 1/12 Lt brachial aline >>> 1/22 1/12 Lt IJ CVL >> 1/22 1/12 ETT >>  1/14 1/12 IVD >>   DISCUSSION: 55 yo female smoker with altered mental status from [facility] and IVH.  Found to have cerebral aneurysm. S/p clipping on 1/13. Developed acute hypoxia w/ CP on 1/22 so PCCM returning to evaluate. CTA negative for PE. Bradycardic into 20s on 1/23 requiring dopamine infusion, troponin mildly elevated at 0.6   ASSESSMENT / PLAN:  NEUROLOGIC A: Acute encephalopathy 2nd to [facility], IVH from posterior circulation aneurysm s/p clipping. Concern about right sided hypo-dense fluid collection on CT head. Neuro-surg watching NEW shift OTHER direction 1/22 P:   Continue serial neuro checks.  Continue nimotop x 3 weeks, consider holding with brady  / hypotension, would await NS input Continue Keppra, consider 21 days support MAP 80-105, see CVS  PULMONARY A: Acute Hypoxic respiratory failure - etiology unclear, CTA negative. Oxygenating well now on [place] @ 3L  P:   Oxygen to keep SpO2 > 92%. LE dopplers pending , conider dc with CT neg angio  CARDIOVASCULAR A: A fib with RVR - transient on admission, resolved  Hx of WPW, HTN. Chest pain - no ischemic changes on ECG 1/23 Mildly elevated Troponin - serial trops 0.6 on 1/22, ? Demand ischemia  Bradycardia - HR 20-40s AM 1/23 with drowsiness, requiring dopamine drip  Adrenal insuff P:  Goal SBP < 160 per neurosurgry, allow  some HTNe Consider MAP 80-105 PRN labetalol - dc, brady  Cycle CEs Repeat echo , have reviewed 1/18 echo Continue dopamine for bradycardia / hypotension to max 5-8 mics beta affect  Add stress dose steroids - Decadron 4 mg q6 , if okay with NS would use hydrocortisone Consider florinef If not at BP goals, would add levophed to these goals Place picc, if unable, will place new line If off pressors, then would use hydral if BP rises  RENAL A: No acute abnormalities  P:   Continue to monitor BMP  NS at 100 ml/hr; cont  NO LASIX  GASTROINTESTINAL A: Nutrition. Dysphagia P:   Continue  aspiration precautions   HEMATOLOGIC A: Anemia of critical illness - stable, no evidence of bleeding  Leukocytosis - ? Steroids vs hemoconcentration (favor from lasix) P:  Trend CBC  SCD's for DVT prevention. Avoid lasix further  INFECTIOUS A: No evidence for infection. Drain in place. P:   Monitor CBC Monitor for fever Pct reassuring  ENDOCRINE A: Hyperglycemia. Presume relative ai P:   Continue to monitor BMP  Add florinef to increased freq steroids   Favor her hemodynamics secondary to CT findings , shift etc Ccm time 30 min   Lavon Paganini. Titus Mould, MD, [place] Pgr: [place] [facility]

## 2016-01-14 NOTE — Progress Notes (Signed)
Peripherally Inserted Central Catheter/Midline Placement  The IV Nurse has discussed with the patient and/or persons authorized to consent for the patient, the purpose of this procedure and the potential benefits and risks involved with this procedure.  The benefits include less needle sticks, lab draws from the catheter and patient may be discharged home with the catheter.  Risks include, but not limited to, infection, bleeding, blood clot (thrombus formation), and puncture of an artery; nerve damage and irregular heat beat.  Alternatives to this procedure were also discussed.  PICC/Midline Placement Documentation        [name] [name] 01/14/2016, 12:56 PM

## 2016-01-14 NOTE — Progress Notes (Signed)
Spoke with Dr. Oletta Darter, MD with critical care regarding patient. Patient bradycardic into the 20s with pauses. Patient alert and oriented x 4, but slightly drowsy. Atropine placed at bedside and patient to be started on Dopamine infusion as directed by Dr. Oletta Darter.

## 2016-01-14 NOTE — Progress Notes (Signed)
eLink Physician-Brief Progress Note Patient Name: [name] [name] DOB: [birth year] MRN: [id number]   Date of Service  01/14/2016  HPI/Events of Note  MAP not at goal  eICU Interventions  Add norepi     Intervention Category Intermediate Interventions: Other:  Whitnie Deleon 01/14/2016, 5:48 PM

## 2016-01-14 NOTE — NC FL2 (Signed)
[place] LEVEL OF CARE SCREENING TOOL     IDENTIFICATION  Patient Name: [name] [name] Birthdate: 1961-09-04 Sex: female Admission Date (Current Location): 01/03/2016  [facility] and [place] Number:  [employer] and Address:  [facility] [place], [place] [address], [place], [place] [address]      Provider Number: [id number]  Attending Physician Name and Address:  Kevan Ny Ditty, MD  Relative Name and Phone Number:  [name] [phone]    Current Level of Care: Hospital Recommended Level of Care: [place] Prior Approval Number:    Date Approved/Denied: 01/14/16 PASRR Number: [id number]  Discharge Plan: SNF    Current Diagnoses: Patient Active Problem List   Diagnosis Date Noted  . Chest pain   . Nontraumatic subarachnoid hemorrhage ([place])   . Acute respiratory failure ([place])   . Encounter for central line placement   . Hypoxemia   . Renal insufficiency   . Encounter for imaging study to confirm nasogastric (NG) tube placement   . Endotracheally intubated   . Respiratory failure ([place])   . SAH (subarachnoid hemorrhage) ([place])   . Subarachnoid hemorrhage due to ruptured aneurysm ([place]) 01/03/2016  . Subarachnoid hemorrhage ([place]) 01/03/2016  . Atrial fibrillation with rapid ventricular response ([place])   . Elevated lactic acid level   . SMOKER 03/20/2009  . HYPERTENSION, UNSPECIFIED 03/20/2009  . WPW 03/20/2009  . SVT/ PSVT/ PAT 03/20/2009  . BRONCHITIS 03/20/2009    Orientation RESPIRATION BLADDER Height & Weight    Self, Time, Situation, Place  Normal Continent 5\' 7"  (170.2 cm) 192 lbs.  BEHAVIORAL SYMPTOMS/MOOD NEUROLOGICAL BOWEL NUTRITION STATUS  Other (Comment) (Appropriate)  (n/a) Continent Diet (Regular)  AMBULATORY STATUS COMMUNICATION OF NEEDS Skin   Extensive Assist Verbally Surgical wounds (Head incision; right groin incision)                       Personal Care Assistance Level of  Assistance  Bathing, Feeding, Dressing Bathing Assistance: Limited assistance Feeding assistance: Limited assistance Dressing Assistance: Limited assistance     Functional Limitations Info  Sight, Hearing, Speech Sight Info: Adequate Hearing Info: Adequate Speech Info: Adequate    SPECIAL CARE FACTORS FREQUENCY  PT (By licensed PT), OT (By licensed OT)     PT Frequency: 5x/week OT Frequency: 5x/week     Speech Therapy Frequency: n/a      Contractures Contractures Info: Not present    Additional Factors Info  Code Status, Allergies Code Status Info: FULL CODE Allergies Info: KNDA           Current Medications (01/14/2016):  This is the current hospital active medication list Current Facility-Administered Medications  Medication Dose Route Frequency Provider Last Rate Last Dose  . 0.9 %  sodium chloride infusion   Intravenous Continuous Kevan Ny Ditty, MD 100 mL/hr at 01/14/16 1600    . acetaminophen (TYLENOL) tablet 650 mg  650 mg Oral Q4H PRN Kevan Ny Ditty, MD   650 mg at 01/09/16 [id number]   Or  . acetaminophen (TYLENOL) suppository 650 mg  650 mg Rectal Q4H PRN Kevan Ny Ditty, MD   650 mg at 01/05/16 1925  . atropine 0.1 MG/ML injection        Stopped at 01/14/16 0630  . dexamethasone (DECADRON) injection 4 mg  4 mg Intravenous 4 times per day Raylene Miyamoto, MD   4 mg at 01/14/16 1156  . DOPamine (INTROPIN) 800 mg in dextrose 5 %  250 mL (3.2 mg/mL) infusion  0-8 mcg/kg/min Intravenous Continuous Raylene Miyamoto, MD 13.1 mL/hr at 01/14/16 1600 8 mcg/kg/min at 01/14/16 1600  . feeding supplement (ENSURE ENLIVE) (ENSURE ENLIVE) liquid 237 mL  237 mL Oral BID BM Asencion Islam, RD   237 mL at 01/13/16 1000  . fludrocortisone (FLORINEF) tablet 0.1 mg  0.1 mg Oral Daily Raylene Miyamoto, MD   0.1 mg at 01/14/16 1100  . hydrALAZINE (APRESOLINE) injection 10 mg  10 mg Intravenous Q4H PRN Anders Simmonds, MD      . Influenza vac split quadrivalent PF  (FLUARIX) injection 0.5 mL  0.5 mL Intramuscular Tomorrow-1000 Kevan Ny Ditty, MD   0.5 mL at 01/09/16 1000  . levETIRAcetam (KEPPRA) tablet 500 mg  500 mg Oral BID Eudelia Bunch, RPH   500 mg at 01/14/16 1100  . niMODipine (NIMOTOP) capsule 60 mg  60 mg Oral Q4H Kevan Ny Ditty, MD   60 mg at 01/14/16 1155   Or  . NiMODipine (NYMALIZE) 60 MG/20ML oral solution 60 mg  60 mg Per Tube Q4H Kevan Ny Ditty, MD   60 mg at 01/14/16 0303  . oxyCODONE-acetaminophen (PERCOCET/ROXICET) 5-325 MG per tablet 1-2 tablet  1-2 tablet Oral Q6H PRN Tamala Fothergill, MD   2 tablet at 01/14/16 0408  . pantoprazole (PROTONIX) EC tablet 40 mg  40 mg Oral QHS Eudelia Bunch, RPH   40 mg at 01/13/16 2143  . RESOURCE THICKENUP CLEAR   Oral PRN Chesley Mires, MD      . sodium chloride 0.9 % injection 10-40 mL  10-40 mL Intracatheter Q12H Kevan Ny Ditty, MD      . sodium chloride 0.9 % injection 10-40 mL  10-40 mL Intracatheter PRN Tamala Fothergill, MD         Discharge Medications: Please see discharge summary for a list of discharge medications.  Relevant Imaging Results:  Relevant Lab Results:   Additional [place] Intern, [id number]

## 2016-01-15 ENCOUNTER — Inpatient Hospital Stay (HOSPITAL_COMMUNITY): Payer: BLUE CROSS/BLUE SHIELD

## 2016-01-15 DIAGNOSIS — I10 Essential (primary) hypertension: Secondary | ICD-10-CM | POA: Insufficient documentation

## 2016-01-15 DIAGNOSIS — D75839 Thrombocytosis, unspecified: Secondary | ICD-10-CM | POA: Insufficient documentation

## 2016-01-15 DIAGNOSIS — Z720 Tobacco use: Secondary | ICD-10-CM

## 2016-01-15 DIAGNOSIS — R0682 Tachypnea, not elsewhere classified: Secondary | ICD-10-CM | POA: Insufficient documentation

## 2016-01-15 DIAGNOSIS — D473 Essential (hemorrhagic) thrombocythemia: Secondary | ICD-10-CM

## 2016-01-15 DIAGNOSIS — D72829 Elevated white blood cell count, unspecified: Secondary | ICD-10-CM | POA: Insufficient documentation

## 2016-01-15 DIAGNOSIS — I480 Paroxysmal atrial fibrillation: Secondary | ICD-10-CM | POA: Insufficient documentation

## 2016-01-15 DIAGNOSIS — R001 Bradycardia, unspecified: Secondary | ICD-10-CM

## 2016-01-15 DIAGNOSIS — D62 Acute posthemorrhagic anemia: Secondary | ICD-10-CM

## 2016-01-15 DIAGNOSIS — I471 Supraventricular tachycardia: Secondary | ICD-10-CM

## 2016-01-15 LAB — CBC WITH DIFFERENTIAL/PLATELET
Basophils Absolute: 0 10*3/uL (ref 0.0–0.1)
Basophils Relative: 0 %
Eosinophils Absolute: 0 10*3/uL (ref 0.0–0.7)
Eosinophils Relative: 0 %
HCT: 30.9 % — ABNORMAL LOW (ref 36.0–46.0)
Hemoglobin: 9.8 g/dL — ABNORMAL LOW (ref 12.0–15.0)
Lymphocytes Relative: 6 %
Lymphs Abs: 1.5 10*3/uL (ref 0.7–4.0)
MCH: 26.9 pg (ref 26.0–34.0)
MCHC: 31.7 g/dL (ref 30.0–36.0)
MCV: 84.9 fL (ref 78.0–100.0)
Monocytes Absolute: 0.5 10*3/uL (ref 0.1–1.0)
Monocytes Relative: 2 %
Neutro Abs: 21.1 10*3/uL — ABNORMAL HIGH (ref 1.7–7.7)
Neutrophils Relative %: 92 %
Platelets: 413 10*3/uL — ABNORMAL HIGH (ref 150–400)
RBC: 3.64 MIL/uL — ABNORMAL LOW (ref 3.87–5.11)
RDW: 14.9 % (ref 11.5–15.5)
WBC: 23.1 10*3/uL — ABNORMAL HIGH (ref 4.0–10.5)

## 2016-01-15 LAB — COMPREHENSIVE METABOLIC PANEL
ALT: 26 U/L (ref 14–54)
AST: 17 U/L (ref 15–41)
Albumin: 5 g/dL (ref 3.5–5.0)
Alkaline Phosphatase: 35 U/L — ABNORMAL LOW (ref 38–126)
Anion gap: 7 (ref 5–15)
BUN: 23 mg/dL — ABNORMAL HIGH (ref 6–20)
CO2: 20 mmol/L — ABNORMAL LOW (ref 22–32)
Calcium: 9.8 mg/dL (ref 8.9–10.3)
Chloride: 113 mmol/L — ABNORMAL HIGH (ref 101–111)
Creatinine, Ser: 0.98 mg/dL (ref 0.44–1.00)
GFR calc Af Amer: >60 mL/min (ref >60)
GFR calc non Af Amer: >60 mL/min (ref >60)
Glucose, Bld: 159 mg/dL — ABNORMAL HIGH (ref 65–99)
Potassium: 4 mmol/L (ref 3.5–5.1)
Sodium: 140 mmol/L (ref 135–145)
Total Bilirubin: 0.6 mg/dL (ref 0.3–1.2)
Total Protein: 7.2 g/dL (ref 6.5–8.1)

## 2016-01-15 LAB — PROCALCITONIN: Procalcitonin: <0.1 ng/mL

## 2016-01-15 LAB — GLUCOSE, CAPILLARY
Glucose-Capillary: 156 mg/dL — ABNORMAL HIGH (ref 65–99)
Glucose-Capillary: 145 mg/dL — ABNORMAL HIGH (ref 65–99)
Glucose-Capillary: 139 mg/dL — ABNORMAL HIGH (ref 65–99)
Glucose-Capillary: 142 mg/dL — ABNORMAL HIGH (ref 65–99)
Glucose-Capillary: 212 mg/dL — ABNORMAL HIGH (ref 65–99)
Glucose-Capillary: 130 mg/dL — ABNORMAL HIGH (ref 65–99)
Glucose-Capillary: 127 mg/dL — ABNORMAL HIGH (ref 65–99)

## 2016-01-15 MED ORDER — HYDROCORTISONE NA SUCCINATE PF 100 MG IJ SOLR
50.0000 mg | Freq: Four times a day (QID) | INTRAMUSCULAR | Status: DC
  Administered 2016-01-15 – 2016-01-16 (×4): 50 mg via INTRAVENOUS
  Filled 2016-01-15 (×4): qty 2

## 2016-01-15 NOTE — Consult Note (Signed)
Physical Medicine and Rehabilitation Consult Reason for Consult: SAH/IVH Referring Physician: Dr.Ditty   HPI: [name] [name] is a 55 y.o. right handed female with history of hypertension, SVT, Wolff-Parkinson-White syndrome. Patient lives with mother and 23-year-old grandson. She works as a [employer]ar[employer] manager. Mother is in good physical health per report and can assist on discharge. One level home with 2 steps to entry. Admitted 01/03/2016 to [facility] emergency department with altered mental status and vomiting as well as complaints of headache. No reports of trauma. CT of the head showed diffuse subarachnoid and intraventricular hemorrhage in an aneurysmal pattern. CT angiogram of the head showed multiple intracranial aneurysms, right MCA, left MCA, right ICA, basilar apex and left PICA. ER course atrial fibrillation with RVR placed on Cardizem drip. She underwent attempted coil embolization of left posterior inferior cerebellar artery per Dr. Kathyrn Sheriff and ultimately with suboccipital craniotomy and C1 laminectomy for clipping of posterior circulation aneurysm 01/04/2016 per Dr.Ditty. Patient did require intubation until 01/05/2016. Echocardiogram 01/14/2016 for evaluation of atrial fibrillation with ejection fraction 65% no wall motion abnormalities. Keppra for seizure prophylaxis.Nimotop for monitoring of blood pressure. Tolerating a regular diet.  Review of Systems  Constitutional: Negative for fever and chills.  HENT: Negative for hearing loss.   Eyes: Negative for double vision.  Respiratory: Negative for cough and shortness of breath.   Cardiovascular: Positive for leg swelling. Negative for chest pain.  Gastrointestinal: Positive for nausea, vomiting and constipation.  Genitourinary: Negative for dysuria and hematuria.  Musculoskeletal: Positive for myalgias.  Neurological: Positive for dizziness and headaches. Negative for seizures.  All other systems reviewed and are negative.  Past  Medical History  Diagnosis Date  . SVT (supraventricular tachycardia) ([place])   . Hypertension   . WPW (Wolff-Parkinson-White syndrome)    Past Surgical History  Procedure Laterality Date  . Radiology with anesthesia N/A 01/03/2016    Procedure: RADIOLOGY WITH ANESTHESIA;  Surgeon: Medication Radiologist, MD;  Location: [place];  Service: Radiology;  Laterality: N/A;  . Craniotomy N/A 01/04/2016    Procedure: Suboccipital Craniotomy and Cervical one Laminectomy for Clipping of Aneurysm;  Surgeon: Kevan Ny Ditty, MD;  Location: [facility];  Service: Neurosurgery;  Laterality: N/A;   History reviewed. No pertinent family history. Social History:  reports that she has been smoking.  She does not have any smokeless tobacco history on file. She reports that she does not drink alcohol or use illicit drugs. Allergies: No Known Allergies Medications Prior to Admission  Medication Sig Dispense Refill  . diphenhydrAMINE (BENADRYL) 25 MG tablet Take 1 tablet (25 mg total) by mouth every 4 (four) hours as needed for itching. 20 tablet 0  . predniSONE (DELTASONE) 20 MG tablet Two tabs po qd x 5 days 10 tablet 0    Home: Home Living Family/patient expects to be discharged to:: Private residence Living Arrangements: Parent, Other relatives (3 y/o grandson) Available Help at Discharge: Family, Available 24 hours/day Type of Home: House Home Access: Stairs to enter Technical brewer of Steps: 2 Entrance Stairs-Rails:  (pt poor historian and unclear answer on this) Home Layout: One level Home Equipment: None Additional Comments: Pt lives w/ mother and 31 y/o grandson.  Per pt mother is in good physical health to assist pt at d/c  Functional History: Prior Function Level of Independence: Independent Functional Status:  Mobility: Bed Mobility Overal bed mobility: Needs Assistance Bed Mobility: Supine to Sit Supine to sit: Min assist, HOB elevated General bed mobility comments:  A with  bringing trunk up to sitting.   Transfers Overall transfer level: Needs assistance Equipment used: 1 person hand held assist Transfers: Sit to/from Stand Sit to Stand: Min assist, +2 safety/equipment Stand pivot transfers: Min assist General transfer comment: pt able to come to stand with only cues for technique, but needs 2nd person to manage lines. Ambulation/Gait Ambulation/Gait assistance: Min assist, +2 safety/equipment Ambulation Distance (Feet): 35 Feet Assistive device: 1 person hand held assist Gait Pattern/deviations: Step-through pattern, Decreased stride length General Gait Details: pt shuffles, but was able to ambulate out in hall.  2nd person present to manage IV pole, IVC pole, and chair follow.   Gait velocity interpretation: Below normal speed for age/gender    ADL:    Cognition: Cognition Overall Cognitive Status: Impaired/Different from baseline Orientation Level: Oriented to person, Oriented to place, Oriented to situation, Disoriented to time Cognition Arousal/Alertness: Awake/alert Behavior During Therapy: Flat affect Overall Cognitive Status: Impaired/Different from baseline Area of Impairment: Attention, Safety/judgement, Awareness, Problem solving Current Attention Level: Selective Safety/Judgement: Decreased awareness of safety, Decreased awareness of deficits Awareness: Emergent Problem Solving: Slow processing, Decreased initiation, Difficulty sequencing, Requires verbal cues, Requires tactile cues General Comments: Pt w/ difficulty explaining home layout and requires step by step cues for transfers  Blood pressure 143/55, pulse 54, temperature 98.2 F (36.8 C), temperature source Oral, resp. rate 19, height 5\' 7"  (1.702 m), weight 88.3 kg (194 lb 10.7 oz), SpO2 100 %. Physical Exam  Vitals reviewed. Constitutional: She is oriented to person, place, and time. She appears well-developed and well-nourished.  HENT:  Mouth/Throat: Oropharynx is clear  and moist.  Craniotomy site with staples intact. Poor dentition  Eyes: EOM are normal.  Pupils reactive to light Conj injected  Neck: Normal range of motion. Neck supple. No thyromegaly present.  Cardiovascular: Normal rate and regular rhythm.   Respiratory: Effort normal and breath sounds normal. No respiratory distress.  GI: Soft. Bowel sounds are normal. She exhibits no distension.  Musculoskeletal: She exhibits no edema or tenderness.  Neurological: She is alert and oriented to person, place, and time. She has normal reflexes.  She will initiate conversation and follows simple commands. Sensation intact to light touch Motor: 4+/5 throughout Neg dysmetria, ataxia  Skin: Skin is warm and dry.  Staples c/d/i  Psychiatric: Her affect is blunt. She is slowed.    Results for orders placed or performed during the hospital encounter of 01/03/16 (from the past 24 hour(s))  Glucose, capillary     Status: Abnormal   Collection Time: 01/14/16 11:30 AM  Result Value Ref Range   Glucose-Capillary 127 (H) 65 - 99 mg/dL   Comment 1 Notify RN    Comment 2 Document in Chart   Glucose, capillary     Status: Abnormal   Collection Time: 01/14/16  4:17 PM  Result Value Ref Range   Glucose-Capillary 134 (H) 65 - 99 mg/dL   Comment 1 Notify RN    Comment 2 Document in Chart   Glucose, capillary     Status: Abnormal   Collection Time: 01/14/16  7:38 PM  Result Value Ref Range   Glucose-Capillary 137 (H) 65 - 99 mg/dL   Comment 1 Notify RN    Comment 2 Document in Chart   Glucose, capillary     Status: Abnormal   Collection Time: 01/14/16 11:28 PM  Result Value Ref Range   Glucose-Capillary 156 (H) 65 - 99 mg/dL   Comment 1 Notify RN    Comment  2 Document in Chart   Glucose, capillary     Status: Abnormal   Collection Time: 01/15/16  3:29 AM  Result Value Ref Range   Glucose-Capillary 145 (H) 65 - 99 mg/dL   Comment 1 Notify RN    Comment 2 Document in Chart   Procalcitonin     Status:  None   Collection Time: 01/15/16  4:41 AM  Result Value Ref Range   Procalcitonin <0.10 ng/mL  Comprehensive metabolic panel     Status: Abnormal   Collection Time: 01/15/16  4:41 AM  Result Value Ref Range   Sodium 140 135 - 145 mmol/L   Potassium 4.0 3.5 - 5.1 mmol/L   Chloride 113 (H) 101 - 111 mmol/L   CO2 20 (L) 22 - 32 mmol/L   Glucose, Bld 159 (H) 65 - 99 mg/dL   BUN 23 (H) 6 - 20 mg/dL   Creatinine, Ser 0.98 0.44 - 1.00 mg/dL   Calcium 9.8 8.9 - 10.3 mg/dL   Total Protein 7.2 6.5 - 8.1 g/dL   Albumin 5.0 3.5 - 5.0 g/dL   AST 17 15 - 41 U/L   ALT 26 14 - 54 U/L   Alkaline Phosphatase 35 (L) 38 - 126 U/L   Total Bilirubin 0.6 0.3 - 1.2 mg/dL   GFR calc non Af Amer >60 >60 mL/min   GFR calc Af Amer >60 >60 mL/min   Anion gap 7 5 - 15  CBC with Differential/Platelet     Status: Abnormal   Collection Time: 01/15/16  4:41 AM  Result Value Ref Range   WBC 23.1 (H) 4.0 - 10.5 K/uL   RBC 3.64 (L) 3.87 - 5.11 MIL/uL   Hemoglobin 9.8 (L) 12.0 - 15.0 g/dL   HCT 30.9 (L) 36.0 - 46.0 %   MCV 84.9 78.0 - 100.0 fL   MCH 26.9 26.0 - 34.0 pg   MCHC 31.7 30.0 - 36.0 g/dL   RDW 14.9 11.5 - 15.5 %   Platelets 413 (H) 150 - 400 K/uL   Neutrophils Relative % 92 %   Neutro Abs 21.1 (H) 1.7 - 7.7 K/uL   Lymphocytes Relative 6 %   Lymphs Abs 1.5 0.7 - 4.0 K/uL   Monocytes Relative 2 %   Monocytes Absolute 0.5 0.1 - 1.0 K/uL   Eosinophils Relative 0 %   Eosinophils Absolute 0.0 0.0 - 0.7 K/uL   Basophils Relative 0 %   Basophils Absolute 0.0 0.0 - 0.1 K/uL   Ct Angio Chest Pe W/cm &/or Wo Cm  01/13/2016  CLINICAL DATA:  Shortness of breath and intracranial hemorrhage. EXAM: CT ANGIOGRAPHY CHEST WITH CONTRAST TECHNIQUE: Multidetector CT imaging of the chest was performed using the standard protocol during bolus administration of intravenous contrast. Multiplanar CT image reconstructions and MIPs were obtained to evaluate the vascular anatomy. CONTRAST:  60mL OMNIPAQUE IOHEXOL 350 MG/ML  SOLN COMPARISON:  Chest x-ray earlier today. FINDINGS: The pulmonary arteries are adequately opacified. There is no evidence of pulmonary embolism. The thoracic aorta is not well opacified. There is no evidence to suggest thoracic aortic aneurysmal disease. The heart size is at the upper limits of normal. No pericardial effusion is seen. There are small bilateral pleural effusions. Lungs show patchy areas of airspace opacification in the upper lobes bilaterally. Although some of these areas are likely representative of atelectasis, subtle infiltrate or early alveolar edema cannot be excluded. Both lung bases demonstrate atelectasis. There is some reflux of contrast into the IVC and  hepatic veins suggestive of right heart failure. No bony abnormalities are seen. No evidence of masses or lymphadenopathy. Review of the MIP images confirms the above findings. IMPRESSION: 1. No evidence of pulmonary embolism. 2. Potential mild pulmonary edema with small bilateral pleural effusions. 3. Reflux of contrast into the IVC and hepatic veins is suggestive of right heart failure. Electronically Signed   By: Aletta Edouard M.D.   On: 01/13/2016 13:08   Dg Chest Port 1 View  01/15/2016  CLINICAL DATA:  Shock. EXAM: PORTABLE CHEST 1 VIEW COMPARISON:  01/13/2016. FINDINGS: Poor inspiration. Grossly stable enlarged cardiac silhouette. Interval mild diffuse ill-defined increased density in the left hemithorax. The underlying scratch the grossly clear lungs. Unremarkable bones. Right PICC tip in the region of the superior cavoatrial junction. IMPRESSION: Mild diffuse increased density in the left hemothorax. This could artifactual in nature or due to a small posteriorly layering left pleural effusion. Electronically Signed   By: Claudie Revering M.D.   On: 01/15/2016 07:41    Assessment/Plan: Diagnosis: SAH/IVH Labs and images independently reviewed.  Records reviewed and summated above.  1. Does the need for close, 24 hr/day  medical supervision in concert with the patient's rehab needs make it unreasonable for this patient to be served in a less intensive setting? Yes  2. Co-Morbidities requiring supervision/potential complications: HTN (monitor and provide prns in accordance with increased physical exertion and pain), SVT (cont to monitor HR with increased activity), Wolff-Parkinson-White syndrome (cont to monitor), atrial fibrillation (monitor with increased exertion, especially given recent RVR), tobacco abuse (cont to counsel), tachypnea (monitor RR and O2 Sats with increased physical exertion), bradycardia (cont to monitor and ensure appropriate increase in HR with physical exertion), leukocytosis (likely secondary to steroids, however cont to monitor labs and signs/symptoms of infection), ABLA (transfuse if necessary to ensure appropriate perfusion for increased activity tolerance), thrombocytosis (likely reactive, cont to monitor) 3. Due to safety, skin/wound care, disease management, medication administration and patient education, does the patient require 24 hr/day rehab nursing? Yes 4. Does the patient require coordinated care of a physician, rehab nurse, PT (1-2 hrs/day, 5 days/week) and OT (1-2 hrs/day, 5 days/week) to address physical and functional deficits in the context of the above medical diagnosis(es)? Yes Addressing deficits in the following areas: endurance, locomotion, strength, transferring, bathing, dressing, grooming, toileting and psychosocial support 5. Can the patient actively participate in an intensive therapy program of at least 3 hrs of therapy per day at least 5 days per week? Likely in the near future 6. The potential for patient to make measurable gains while on inpatient rehab is excellent 7. Anticipated functional outcomes upon discharge from inpatient rehab are modified independent  with PT, independent and modified independent with OT, n/a with SLP. 8. Estimated rehab length of stay to  reach the above functional goals is: 12-15 days. 9. Does the patient have adequate social supports and living environment to accommodate these discharge functional goals? Yes 10. Anticipated D/C setting: Home 11. Anticipated post D/C treatments: HH therapy and Home excercise program 12. Overall Rehab/Functional Prognosis: excellent  RECOMMENDATIONS: This patient's condition is appropriate for continued rehabilitative care in the following setting: CIR once medically stable Patient has agreed to participate in recommended program. Yes Note that insurance prior authorization may be required for reimbursement for recommended care.  Comment: Rehab Admissions Coordinator to follow up.  Delice Lesch, MD 01/15/2016

## 2016-01-15 NOTE — Evaluation (Signed)
Occupational Therapy Evaluation Patient Details Name: [name] [name] MRN: [id number] DOB: June 12, 1961 Today's Date: 01/15/2016    History of Present Illness Pt is a 55 yo female smoker with altered mental status from [facility] and IVH. Found to have cerebral aneurysm, intubated 1/12, 1/13 suboccipital craniotomy, C1 laminectomy, clipping of posterior circulation aneurysm, extubated 1/14. Has IVD.    Clinical Impression   This 55 yo female admitted with above and presents to acute OT with deficits below will benefit from acute OT with follow up OT on CIR to get back to an independent level prior to return home with mom and to take care of her 31yo grandson (whom she has guardianship of).     Follow Up Recommendations  CIR    Equipment Recommendations  Tub/shower seat       Precautions / Restrictions Precautions Precautions: Fall Precaution Comments: IVC removed Restrictions Weight Bearing Restrictions: No      Mobility Bed Mobility Overal bed mobility: Needs Assistance Bed Mobility: Supine to Sit;Sit to Supine     Supine to sit: HOB elevated;Min guard Sit to supine: Min guard   General bed mobility comments: A with bringing trunk up to sitting.    Transfers Overall transfer level: Needs assistance Equipment used: 1 person hand held assist Transfers: Sit to/from [facility] Sit to Stand: Min assist Stand pivot transfers: Min assist       General transfer comment: pt with definite use of UEs and mild balance deficits.  pt indicates feeling "a little" dizzy, but that it improved after standing a few seconds.      Balance Overall balance assessment: Needs assistance Sitting-balance support: No upper extremity supported;Feet supported Sitting balance-Leahy Scale: Good     Standing balance support: During functional activity Standing balance-Leahy Scale: Fair                              ADL Overall ADL's : Needs  assistance/impaired Eating/Feeding: Supervision/ safety;Sitting   Grooming: Set up;Supervision/safety;Sitting   Upper Body Bathing: Supervision/ safety;Set up;Sitting   Lower Body Bathing: Minimal assistance;Sit to/from stand   Upper Body Dressing : Set up;Sitting;Supervision/safety   Lower Body Dressing: Minimal assistance;Sit to/from stand (pt able to cross one leg over other to don socks; however had trouble getting left sock on trying to use one hand, when cued to use both hands she had not issues. Spontaneously used both hands to don right sock)   Toilet Transfer: Minimal assistance;Stand-pivot;BSC   Toileting- Clothing Manipulation and Hygiene: Minimal assistance;Sit to/from stand                         Pertinent Vitals/Pain Pain Assessment: No/denies pain Faces Pain Scale: Hurts a little bit Pain Location: Head Pain Descriptors / Indicators: Headache Pain Intervention(s): Monitored during session;Premedicated before session;Repositioned     Hand Dominance Right   Extremity/Trunk Assessment Upper Extremity Assessment Upper Extremity Assessment: Generalized weakness           Communication Communication Communication: No difficulties   Cognition Arousal/Alertness: Awake/alert Behavior During Therapy: WFL for tasks assessed/performed Overall Cognitive Status: Impaired/Different from baseline Area of Impairment: Safety/judgement;Problem solving   Current Attention Level: Alternating     Safety/Judgement: Decreased awareness of deficits;Decreased awareness of safety Awareness: Emergent Problem Solving: Slow processing;Decreased initiation;Difficulty sequencing;Requires verbal cues;Requires tactile cues General Comments: pt with improved attention this session and asking appropriate question about continued rehab.  Home Living Family/patient expects to be discharged to:: Private residence Living Arrangements: Parent;Other relatives (37  yo grandson that she has guardianship of) Available Help at Discharge: Family;Available 24 hours/day Type of Home: House Home Access: Stairs to enter [employer] of Steps: 2   Home Layout: One level     Bathroom Shower/Tub: Tub/shower unit;Curtain Shower/tub characteristics: Architectural technologist: Standard     Home Equipment: None   Additional Comments: Pt lives w/ mother and 81 y/o grandson.  Per pt mother is in good physical health to assist pt at d/c      Prior Functioning/Environment Level of Independence: Independent             OT Diagnosis: Generalized weakness;Cognitive deficits   OT Problem List: Decreased strength;Impaired balance (sitting and/or standing);Decreased cognition;Decreased knowledge of use of DME or AE   OT Treatment/Interventions: Self-care/ADL training;Patient/family education;Balance training;Therapeutic activities;DME and/or AE instruction;Cognitive remediation/compensation    OT Goals(Current goals can be found in the care plan section) Acute Rehab OT Goals Patient Stated Goal: to get back to my job OT Goal Formulation: With patient Time For Goal Achievement: 01/29/16 Potential to Achieve Goals: Good  OT Frequency: Min 3X/week              End of Session Nurse Communication:  (Ok'd to give her a diet sprite)  Activity Tolerance: Patient tolerated treatment well Patient left: in bed;with call bell/phone within reach;with bed alarm set   Time: 1244-1306 OT Time Calculation (min): 22 min Charges:  OT General Charges $OT Visit: 1 Procedure OT Evaluation $OT Eval Moderate Complexity: 1 Procedure  Almon Register [id number] 01/15/2016, 2:07 PM

## 2016-01-15 NOTE — Clinical Social Work Note (Signed)
Clinical Social Work Assessment  Patient Details  Name: [name] [name] MRN: [id number] Date of Birth: Jan 23, 1961  Date of referral:  01/15/16               Reason for consult:  Facility Placement                Permission sought to share information with:  Facility Sport and exercise psychologist, Family Supports Permission granted to share information::  Yes, Verbal Permission Granted  Name::     [name] [name]   Agency::  SNF  Relationship::  Mother  Contact Information:  [phone]  Housing/Transportation Living arrangements for the past 2 months:  Single Family Home Source of Information:  Patient Patient Interpreter Needed:  None Criminal Activity/Legal Involvement Pertinent to Current Situation/Hospitalization:  No - Comment as needed Significant Relationships:  Parents, Siblings Lives with:  Parents Do you feel safe going back to the place where you live?  Yes Need for family participation in patient care:  Yes (Comment)  Care giving concerns:  Patient lives at home with her mother and is in need of short-term rehab following discharge. PT has recommended a SNF.   Social Worker assessment / plan:  BSW intern met with patient at bedside to complete assessment. BSW intern asked patient what happened and patient stated that she had an aneurysm. BSW intern explained that the PT had recommended in-patient rehab, but SNF could be an alternative if in-patient rehab did not work out. Patient was agreeable to SNF as an alternate plan of care. Patient prefers a SNF facility close to where she lives in [place]. Patient stated that she lives at home with her mother and she has two younger siblings. Social worker will continue to follow and assist as needed.  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  Patient/Family's Understanding of and Emotional Response to Diagnosis, Current Treatment, and Prognosis:  During patient assessment, patient seemed to be tearful and distracted. Patient answered BSW intern's questions, but patient did not seem engaged in the conversation. Patient was appropriate and appeared to understand the reason for her hospitalization, current treatment, and post discharge needs.  Emotional Assessment Appearance:  Appears stated age Attitude/Demeanor/Rapport:  Lethargic, Other (Tearful, avoiding eye contact) Affect (typically observed):  Tearful/Crying, Withdrawn, Flat, Apprehensive, Appropriate, Quiet, Calm Orientation:  Oriented to Self, Oriented to Place, Oriented to  Time, Oriented to Situation Alcohol / Substance use:  Not Applicable Psych involvement (Current and /or in the community):  No (Comment)  Discharge Needs  Concerns to be addressed:  No discharge needs identified Readmission within the last 30 days:  No Current discharge risk:  None Barriers to Discharge:  Continued Medical Work up    [employer], [id number]

## 2016-01-15 NOTE — Progress Notes (Signed)
Pt seen and examined. No issues overnight.  EXAM: Temp:  [97.7 F (36.5 C)-98.5 F (36.9 C)] 98 F (36.7 C) (01/24 1200) Pulse Rate:  [44-93] 56 (01/24 1100) Resp:  [5-25] 17 (01/24 1100) BP: (109-170)/(33-86) 141/50 mmHg (01/24 1100) SpO2:  [96 %-100 %] 100 % (01/24 1100) Weight:  [88.3 kg (194 lb 10.7 oz)] 88.3 kg (194 lb 10.7 oz) (01/24 0327) Intake/Output      01/23 0701 - 01/24 0700 01/24 0701 - 01/25 0700   I.V. (mL/kg) 3107.5 (35.2) 423.9 (4.8)   Total Intake(mL/kg) 3107.5 (35.2) 423.9 (4.8)   Urine (mL/kg/hr) 650 (0.3) 850 (1.5)   Stool 0 (0) 0 (0)   Total Output 650 850   Net +2457.5 -426.1        Urine Occurrence 1 x 1 x   Stool Occurrence 2 x 1 x    Awake and alert Follows commands throughout Full strength Incision c/d/i and flat  Stable Continue current care Ok to wean nimodipine starting Thursday

## 2016-01-15 NOTE — Progress Notes (Signed)
I will follow up with pt and family to discuss the possibility of an inpt rehab admission pending [employer] approval and bed availability. [id number]

## 2016-01-15 NOTE — Progress Notes (Signed)
Physical Therapy Treatment Patient Details Name: [name] [name] MRN: [id number] DOB: 01/30/1961 Today's Date: 01/15/2016    History of Present Illness Pt is a 55 yo female smoker with altered mental status from [facility] and IVH. Found to have cerebral aneurysm, intubated 1/12, 1/13 suboccipital craniotomy, C1 laminectomy, clipping of posterior circulation aneurysm, extubated 1/14. Has IVD.     PT Comments    Pt able to increase ambulation distance today and did better staying on task with PT.  Pt able to perform own peri hygiene in standing and demonstrated good use of grab bar in bathroom.  Continue to feel pt would benefit from CIR at D/C to maximize independence.  Will continue to follow.    Follow Up Recommendations  CIR     Equipment Recommendations  None recommended by PT    Recommendations for Other Services       Precautions / Restrictions Precautions Precautions: Fall Precaution Comments: IVC removed Restrictions Weight Bearing Restrictions: No    Mobility  Bed Mobility Overal bed mobility: Needs Assistance Bed Mobility: Supine to Sit;Sit to Supine     Supine to sit: Min assist;HOB elevated Sit to supine: Min guard   General bed mobility comments: A with bringing trunk up to sitting.    Transfers Overall transfer level: Needs assistance Equipment used: 1 person hand held assist Transfers: Sit to/from Stand Sit to Stand: Min assist         General transfer comment: pt with definite use of UEs and mild balance deficits.  pt indicates feeling "a little" dizzy, but that it improved after standing a few seconds.    Ambulation/Gait Ambulation/Gait assistance: Min assist Ambulation Distance (Feet): 110 Feet Assistive device: 1 person hand held assist Gait Pattern/deviations: Step-through pattern;Decreased stride length     General Gait Details: pt with slow shuffle steps and mild balance deficits.  pt able to increase ambulation distance today.  Attempted to  have pt perform small head turns during ambulation, however pt with very limited cervical rotation.     Stairs            Wheelchair Mobility    Modified Rankin (Stroke Patients Only) Modified Rankin (Stroke Patients Only) Pre-Morbid Rankin Score: No symptoms Modified Rankin: Moderately severe disability     Balance Overall balance assessment: Needs assistance Sitting-balance support: No upper extremity supported;Feet supported Sitting balance-Leahy Scale: Good     Standing balance support: During functional activity Standing balance-Leahy Scale: Fair                      Cognition Arousal/Alertness: Awake/alert Behavior During Therapy: Flat affect Overall Cognitive Status: Impaired/Different from baseline Area of Impairment: Attention;Safety/judgement;Awareness;Problem solving   Current Attention Level: Alternating     Safety/Judgement: Decreased awareness of safety;Decreased awareness of deficits Awareness: Emergent Problem Solving: Slow processing;Decreased initiation;Difficulty sequencing;Requires verbal cues;Requires tactile cues General Comments: pt with improved attention this session and asking appropriate question about continued rehab.    Exercises      General Comments        Pertinent Vitals/Pain Pain Assessment: Faces Faces Pain Scale: Hurts a little bit Pain Location: Head Pain Descriptors / Indicators: Headache Pain Intervention(s): Monitored during session;Premedicated before session;Repositioned    Home Living                      Prior Function            PT Goals (current goals can now be found in  the care plan section) Acute Rehab PT Goals Patient Stated Goal: to do as much as I can PT Goal Formulation: With patient Time For Goal Achievement: 01/26/16 Potential to Achieve Goals: Good Progress towards PT goals: Progressing toward goals    Frequency  Min 4X/week    PT Plan Current plan remains appropriate     Co-evaluation             End of Session Equipment Utilized During Treatment: Gait belt Activity Tolerance: Patient tolerated treatment well Patient left: in bed;with call bell/phone within reach;with bed alarm set     Time: [id number] PT Time Calculation (min) (ACUTE ONLY): 31 min  Charges:  $Gait Training: 23-37 mins                    G CodesCatarina Hartshorn, [place] 01/15/2016, 1:42 PM

## 2016-01-15 NOTE — Progress Notes (Signed)
PULMONARY / CRITICAL CARE MEDICINE   Name: DOTTYE TANDON MRN: [id number] DOB: 1961-04-30    ADMISSION DATE:  01/03/2016 CONSULTATION DATE:  01/03/16  REFERRING MD:  Ditty (neurosurgery)  CHIEF COMPLAINT:  Altered mental status  SUBJECTIVE:  Feeling well, denies chest pain, SOB  MAP not at goal overnight, levophed added   VITAL SIGNS: BP 130/54 mmHg  Pulse 54  Temp(Src) 97.9 F (36.6 C) (Oral)  Resp 18  Ht 5\' 7"  (1.702 m)  Wt 192 lb 14.4 oz (87.5 kg)  BMI 30.21 kg/m2  SpO2 100% 100% NRB  HEMODYNAMICS:    VENTILATOR SETTINGS:    INTAKE / OUTPUT: I/O last 3 completed shifts: In: 3500 [I.V.:3500] Out: 1300 [Urine:1300]  PHYSICAL EXAMINATION:  General: Lying in bed, awake, calm, NAD Neuro: Nonfocal, grip strength equal, 5/5 strength bilateral extremities  HEENT: Pupils equal and minimally reactive. Mucous membranes dry  Cardiovascular:  Bradycardic, regular. No murmurs, rubs or gallops  Lungs: Clear and equal bilaterally, no wheezes.  Abdomen: Soft, non-tender. BS x 4.  Musculoskeletal: Moves all extremities, no acute deformities. No edema.   LABS:  BMET  Recent Labs Lab 01/09/16 0448 01/10/16 0423 01/13/16 0940  NA 142 139 139  K 4.0 4.3 4.2  CL 111 110 109  CO2 22 21* 21*  BUN 24* 23* 20  CREATININE 0.93 0.92 1.01*  GLUCOSE 123* 112* 116*    Electrolytes  Recent Labs Lab 01/09/16 0448 01/10/16 0423 01/13/16 0940  CALCIUM 9.9 9.9 9.9  MG 2.2 2.2 2.1  PHOS 3.3 3.2 3.6    CBC  Recent Labs Lab 01/09/16 0448 01/10/16 0423 01/13/16 0940  WBC 13.6* 13.1* 23.5*  HGB 8.8* 8.9* 10.1*  HCT 27.2* 27.9* 31.0*  PLT 246 293 359    Coag's No results for input(s): APTT, INR in the last 168 hours.  Sepsis Markers  Recent Labs Lab 01/13/16 0940 01/14/16 0320  LATICACIDVEN 0.9  --   PROCALCITON <0.10 <0.10    ABG No results for input(s): PHART, PCO2ART, PO2ART in the last 168 hours.  Liver Enzymes  Recent Labs Lab 01/13/16 0940   AST 15  ALT 20  ALKPHOS 30*  BILITOT 0.5  ALBUMIN 5.1*    Cardiac Enzymes  Recent Labs Lab 01/13/16 0920 01/13/16 1520 01/13/16 2136  TROPONINI 0.10* 0.40* 0.60*    Glucose  Recent Labs Lab 01/13/16 0747 01/13/16 1155 01/13/16 1608 01/13/16 1911 01/13/16 2345 01/14/16 0308  GLUCAP 101* 97 118* 137* 110* 105*    Imaging Ct Angio Chest Pe W/cm &/or Wo Cm  01/13/2016  CLINICAL DATA:  Shortness of breath and intracranial hemorrhage. EXAM: CT ANGIOGRAPHY CHEST WITH CONTRAST TECHNIQUE: Multidetector CT imaging of the chest was performed using the standard protocol during bolus administration of intravenous contrast. Multiplanar CT image reconstructions and MIPs were obtained to evaluate the vascular anatomy. CONTRAST:  57mL OMNIPAQUE IOHEXOL 350 MG/ML SOLN COMPARISON:  Chest x-ray earlier today. FINDINGS: The pulmonary arteries are adequately opacified. There is no evidence of pulmonary embolism. The thoracic aorta is not well opacified. There is no evidence to suggest thoracic aortic aneurysmal disease. The heart size is at the upper limits of normal. No pericardial effusion is seen. There are small bilateral pleural effusions. Lungs show patchy areas of airspace opacification in the upper lobes bilaterally. Although some of these areas are likely representative of atelectasis, subtle infiltrate or early alveolar edema cannot be excluded. Both lung bases demonstrate atelectasis. There is some reflux of contrast into the IVC and  hepatic veins suggestive of right heart failure. No bony abnormalities are seen. No evidence of masses or lymphadenopathy. Review of the MIP images confirms the above findings. IMPRESSION: 1. No evidence of pulmonary embolism. 2. Potential mild pulmonary edema with small bilateral pleural effusions. 3. Reflux of contrast into the IVC and hepatic veins is suggestive of right heart failure. Electronically Signed   By: Aletta Edouard M.D.   On: 01/13/2016 13:08    Dg Chest Port 1 View  01/13/2016  CLINICAL DATA:  Chest tightness this morning. EXAM: PORTABLE CHEST 1 VIEW COMPARISON:  Nineteen 2017 FINDINGS: Left internal jugular approach central venous catheter is unchanged, tip at the expected location of the bifurcation of superior vena cava and the innominate vein. The cardiac silhouette is mildly enlarged. Mediastinal contours appear intact. There is no evidence of focal airspace consolidation, pleural effusion or pneumothorax. Osseous structures are without acute abnormality. Soft tissues are grossly normal. IMPRESSION: Mildly enlarged cardiac silhouette, without evidence of focal airspace consolidation or pulmonary edema. Electronically Signed   By: Fidela Salisbury M.D.   On: 01/13/2016 10:08     STUDIES:  1/12 CT head >> SAH with IVH 1/12 CTA head >> multiple aneurysms with 7 mm Lt PICA aneurysm as source of [facility] 1/22 ct head: Post therapy changes as noted above.Left subdural collection has decreased in size now best visualized along the convexity.Interval development of relatively isodense broad-based right subdural collection with maximal thickness of 4.8 mm causing mass effect upon the right lateral ventricle with midline shift to the left by 4.4 mm versus prior midline shift to the right by 3 mm. Slight increased size of left atrium will need to be monitored closely to exclude developing hydrocephalus.Left posterior cerebellar infarct. Resolving subarachnoid hemorrhage. Slightly patchy subcortical white matter. Mild brain edema may be present. Slight inferior displacement of the left cerebellar tonsil. 1/24 Echo >> EF [id number], no WMA, diastolic function was normal.   CULTURES:  ANTIBIOTICS: 1/12 Ancef >> 1/13  SIGNIFICANT EVENTS: 1/12 Admit, to OR 1/13 To OR >> suboccipital craniotomy, C1 laminectomy, clipping of posterior circulation aneurysm  LINES/TUBES: 1/12 Lt brachial aline >>> 1/22 1/12 Lt IJ CVL >> 1/22 1/12 ETT >>  1/14 1/12 IVD >> 1/23   DISCUSSION:  55 yo female smoker with altered mental status from [facility] and IVH.  Found to have cerebral aneurysm. S/p clipping on 1/13. Developed acute hypoxia w/ CP on 1/22 so PCCM returning to evaluate. CTA negative for PE. Bradycardic into 20s on 1/23 requiring dopamine infusion. Bradycardia resolving but requiring pressors to maintain MAP goal.   ASSESSMENT / PLAN:  NEUROLOGIC A: Acute encephalopathy 2nd to [facility], IVH from posterior circulation aneurysm s/p clipping. Concern about right sided hypo-dense fluid collection on CT head. Neuro-surg watching NEW shift OTHER direction 1/22 P:   Continue serial neuro checks.  Continue nimotop x 3 weeks, consider reductin to 30 given day 12 (getting near end vasospasm risk) and HR Continue Keppra, consider 21 days Support MAP 80-105, see CVS Consider statin if we do reduce nimodipine  PULMONARY A: Acute Hypoxic respiratory failure - etiology unclear, CTA negative. Oxygenating well now on [place] @ 3L, no evidence PE P:   Oxygen to keep SpO2 > 92%.  CARDIOVASCULAR A: A fib with RVR - transient on admission, resolved  Hx of WPW, HTN. Chest pain - resolved, echo normal 1/23, no ischemic changes on ECG  Mildly elevated Troponin - serial trops 0.6 on 1/22, ? Demand ischemia  Bradycardia -  HR remains in the 50s, asymptomatic  Adrenal insuff P:  Continue levophed for MAP 80-105 Continue dopamine for bradycardia / hypotension to max 5-8 mics beta affect  Continue Decadron 4 mg q6, would favor use hydrocortisone 50 q6h picc noted, re assess cvp If pressors rise I will place a line  RENAL A: No acute abnormalities  P:   Continue to monitor BMP  NS at 100 ml/hr, get cvp NO LASIX  GASTROINTESTINAL A: Nutrition. Dysphagia P:   Continue aspiration precautions   HEMATOLOGIC A: Anemia of critical illness - stable, no evidence of bleeding  Leukocytosis - ? Steroids vs hemoconcentration P:  Trend CBC  SCD's for  DVT prevention.  INFECTIOUS A: No evidence for infection P:   Monitor CBC Monitor for fever  ENDOCRINE A: Hyperglycemia. Presume relative ai P:   Continue to monitor BMP  Continue florinef Change to hydrocortisone  Ccm time 30 min    Lavon Paganini. Titus Mould, MD, [place] Pgr: [facility]

## 2016-01-15 NOTE — Clinical Social Work Placement (Signed)
   CLINICAL SOCIAL WORK PLACEMENT  NOTE  Date:  01/15/2016  Patient Details  Name: [name] [name] MRN: [id number] Date of Birth: 05-10-1961  Clinical Social Work is seeking post-discharge placement for this patient at the [place] level of care (*CSW will initial, date and re-position this form in  chart as items are completed):  Yes   Patient/family provided with [place] Work Department's list of facilities offering this level of care within the geographic area requested by the patient (or if unable, by the patient's family).  Yes   Patient/family informed of their freedom to choose among providers that offer the needed level of care, that participate in Medicare, Medicaid or managed care program needed by the patient, have an available bed and are willing to accept the patient.  Yes   Patient/family informed of [place]'s ownership interest in [facility] and [facility], as well as of the fact that they are under no obligation to receive care at these facilities.  PASRR submitted to EDS on 01/15/16     PASRR number received on 01/15/16     Existing PASRR number confirmed on       FL2 transmitted to all facilities in geographic area requested by pt/family on       FL2 transmitted to all facilities within larger geographic area on       Patient informed that his/her managed care company has contracts with or will negotiate with certain facilities, including the following:            Patient/family informed of bed offers received.  Patient chooses bed at       Physician recommends and patient chooses bed at      Patient to be transferred to   on  .  Patient to be transferred to facility by       Patient family notified on   of transfer.  Name of family member notified:        PHYSICIAN Please prepare priority discharge summary, including medications, Please prepare prescriptions, Please sign FL2     Additional Comment:     _______________________________________________ Rigoberto Noel, LCSW 01/15/2016, 5:29 PM

## 2016-01-16 LAB — GLUCOSE, CAPILLARY
Glucose-Capillary: 145 mg/dL — ABNORMAL HIGH (ref 65–99)
Glucose-Capillary: 137 mg/dL — ABNORMAL HIGH (ref 65–99)
Glucose-Capillary: 113 mg/dL — ABNORMAL HIGH (ref 65–99)
Glucose-Capillary: 109 mg/dL — ABNORMAL HIGH (ref 65–99)
Glucose-Capillary: 117 mg/dL — ABNORMAL HIGH (ref 65–99)
Glucose-Capillary: 101 mg/dL — ABNORMAL HIGH (ref 65–99)

## 2016-01-16 LAB — BASIC METABOLIC PANEL
Anion gap: 11 (ref 5–15)
BUN: 16 mg/dL (ref 6–20)
CO2: 20 mmol/L — ABNORMAL LOW (ref 22–32)
Calcium: 9.6 mg/dL (ref 8.9–10.3)
Chloride: 108 mmol/L (ref 101–111)
Creatinine, Ser: 0.93 mg/dL (ref 0.44–1.00)
GFR calc Af Amer: >60 mL/min (ref >60)
GFR calc non Af Amer: >60 mL/min (ref >60)
Glucose, Bld: 146 mg/dL — ABNORMAL HIGH (ref 65–99)
Potassium: 3.9 mmol/L (ref 3.5–5.1)
Sodium: 139 mmol/L (ref 135–145)

## 2016-01-16 LAB — CBC WITH DIFFERENTIAL/PLATELET
Basophils Absolute: 0 10*3/uL (ref 0.0–0.1)
Basophils Relative: 0 %
Eosinophils Absolute: 0 10*3/uL (ref 0.0–0.7)
Eosinophils Relative: 0 %
HCT: 28.5 % — ABNORMAL LOW (ref 36.0–46.0)
Hemoglobin: 9.3 g/dL — ABNORMAL LOW (ref 12.0–15.0)
Lymphocytes Relative: 5 %
Lymphs Abs: 0.9 10*3/uL (ref 0.7–4.0)
MCH: 27.9 pg (ref 26.0–34.0)
MCHC: 32.6 g/dL (ref 30.0–36.0)
MCV: 85.6 fL (ref 78.0–100.0)
Monocytes Absolute: 1.5 10*3/uL — ABNORMAL HIGH (ref 0.1–1.0)
Monocytes Relative: 8 %
Neutro Abs: 17.9 10*3/uL — ABNORMAL HIGH (ref 1.7–7.7)
Neutrophils Relative %: 87 %
Platelets: 359 10*3/uL (ref 150–400)
RBC: 3.33 MIL/uL — ABNORMAL LOW (ref 3.87–5.11)
RDW: 15.3 % (ref 11.5–15.5)
WBC: 20.4 10*3/uL — ABNORMAL HIGH (ref 4.0–10.5)

## 2016-01-16 MED ORDER — HYDROCORTISONE NA SUCCINATE PF 100 MG IJ SOLR
25.0000 mg | Freq: Four times a day (QID) | INTRAMUSCULAR | Status: DC
  Administered 2016-01-16 – 2016-01-17 (×5): 25 mg via INTRAVENOUS
  Filled 2016-01-16 (×5): qty 2

## 2016-01-16 NOTE — Progress Notes (Signed)
Pt seen and examined. No issues overnight.  EXAM: Temp:  [98 F (36.7 C)-99.3 F (37.4 C)] 98.1 F (36.7 C) (01/25 0400) Pulse Rate:  [49-75] 67 (01/25 0700) Resp:  [4-26] 17 (01/25 0700) BP: (111-173)/(27-77) 135/48 mmHg (01/25 0700) SpO2:  [98 %-100 %] 100 % (01/25 0700) Weight:  [88.6 kg (195 lb 5.2 oz)] 88.6 kg (195 lb 5.2 oz) (01/25 0500) Intake/Output      01/24 0701 - 01/25 0700 01/25 0701 - 01/26 0700   I.V. (mL/kg) 3705.8 (41.8) 146.9 (1.7)   Total Intake(mL/kg) 3705.8 (41.8) 146.9 (1.7)   Urine (mL/kg/hr) 1700 (0.8)    Stool 4 (0)    Total Output 1704     Net +2001.8 +146.9        Urine Occurrence 4 x 1 x   Stool Occurrence 1 x     Awake and alert Follows commands throughout Full strength Incision c/d/i and flat  Stable Continue current care Ok to wean nimodipine tomorrow Goal MAP > 60

## 2016-01-16 NOTE — Progress Notes (Signed)
PULMONARY / CRITICAL CARE MEDICINE   Name: CAMPBELL DEALE MRN: [id number] DOB: 1961/03/30    ADMISSION DATE:  01/03/2016 CONSULTATION DATE:  01/03/16  REFERRING MD:  Ditty (neurosurgery)  CHIEF COMPLAINT:  Altered mental status  SUBJECTIVE:  Doing well, no events overnight   VITAL SIGNS: BP 130/54 mmHg  Pulse 54  Temp(Src) 97.9 F (36.6 C) (Oral)  Resp 18  Ht 5\' 7"  (1.702 m)  Wt 192 lb 14.4 oz (87.5 kg)  BMI 30.21 kg/m2  SpO2 100% 100% NRB  HEMODYNAMICS:    VENTILATOR SETTINGS:    INTAKE / OUTPUT: I/O last 3 completed shifts: In: 3500 [I.V.:3500] Out: 1300 [Urine:1300]  PHYSICAL EXAMINATION:  General: Lying in bed, awake, calm, NAD Neuro: Nonfocal, grip strength equal, 5/5 strength bilateral extremities  HEENT: Pupils equal and minimally reactive. Mucous membranes dry  Cardiovascular:  Bradycardic, regular. No murmurs, rubs or gallops  Lungs: Clear and equal bilaterally, no wheezes.  Abdomen: Soft, non-tender. BS x 4.  Musculoskeletal: Moves all extremities, no acute deformities. No edema.   LABS:  BMET  Recent Labs Lab 01/09/16 0448 01/10/16 0423 01/13/16 0940  NA 142 139 139  K 4.0 4.3 4.2  CL 111 110 109  CO2 22 21* 21*  BUN 24* 23* 20  CREATININE 0.93 0.92 1.01*  GLUCOSE 123* 112* 116*    Electrolytes  Recent Labs Lab 01/09/16 0448 01/10/16 0423 01/13/16 0940  CALCIUM 9.9 9.9 9.9  MG 2.2 2.2 2.1  PHOS 3.3 3.2 3.6    CBC  Recent Labs Lab 01/09/16 0448 01/10/16 0423 01/13/16 0940  WBC 13.6* 13.1* 23.5*  HGB 8.8* 8.9* 10.1*  HCT 27.2* 27.9* 31.0*  PLT 246 293 359    Coag's No results for input(s): APTT, INR in the last 168 hours.  Sepsis Markers  Recent Labs Lab 01/13/16 0940 01/14/16 0320  LATICACIDVEN 0.9  --   PROCALCITON <0.10 <0.10    ABG No results for input(s): PHART, PCO2ART, PO2ART in the last 168 hours.  Liver Enzymes  Recent Labs Lab 01/13/16 0940  AST 15  ALT 20  ALKPHOS 30*  BILITOT 0.5   ALBUMIN 5.1*    Cardiac Enzymes  Recent Labs Lab 01/13/16 0920 01/13/16 1520 01/13/16 2136  TROPONINI 0.10* 0.40* 0.60*    Glucose  Recent Labs Lab 01/13/16 0747 01/13/16 1155 01/13/16 1608 01/13/16 1911 01/13/16 2345 01/14/16 0308  GLUCAP 101* 97 118* 137* 110* 105*    Imaging Ct Angio Chest Pe W/cm &/or Wo Cm  01/13/2016  CLINICAL DATA:  Shortness of breath and intracranial hemorrhage. EXAM: CT ANGIOGRAPHY CHEST WITH CONTRAST TECHNIQUE: Multidetector CT imaging of the chest was performed using the standard protocol during bolus administration of intravenous contrast. Multiplanar CT image reconstructions and MIPs were obtained to evaluate the vascular anatomy. CONTRAST:  21mL OMNIPAQUE IOHEXOL 350 MG/ML SOLN COMPARISON:  Chest x-ray earlier today. FINDINGS: The pulmonary arteries are adequately opacified. There is no evidence of pulmonary embolism. The thoracic aorta is not well opacified. There is no evidence to suggest thoracic aortic aneurysmal disease. The heart size is at the upper limits of normal. No pericardial effusion is seen. There are small bilateral pleural effusions. Lungs show patchy areas of airspace opacification in the upper lobes bilaterally. Although some of these areas are likely representative of atelectasis, subtle infiltrate or early alveolar edema cannot be excluded. Both lung bases demonstrate atelectasis. There is some reflux of contrast into the IVC and hepatic veins suggestive of right heart failure. No bony  abnormalities are seen. No evidence of masses or lymphadenopathy. Review of the MIP images confirms the above findings. IMPRESSION: 1. No evidence of pulmonary embolism. 2. Potential mild pulmonary edema with small bilateral pleural effusions. 3. Reflux of contrast into the IVC and hepatic veins is suggestive of right heart failure. Electronically Signed   By: Aletta Edouard M.D.   On: 01/13/2016 13:08   Dg Chest Port 1 View  01/13/2016  CLINICAL  DATA:  Chest tightness this morning. EXAM: PORTABLE CHEST 1 VIEW COMPARISON:  Nineteen 2017 FINDINGS: Left internal jugular approach central venous catheter is unchanged, tip at the expected location of the bifurcation of superior vena cava and the innominate vein. The cardiac silhouette is mildly enlarged. Mediastinal contours appear intact. There is no evidence of focal airspace consolidation, pleural effusion or pneumothorax. Osseous structures are without acute abnormality. Soft tissues are grossly normal. IMPRESSION: Mildly enlarged cardiac silhouette, without evidence of focal airspace consolidation or pulmonary edema. Electronically Signed   By: Fidela Salisbury M.D.   On: 01/13/2016 10:08     STUDIES:  1/12 CT head >> SAH with IVH 1/12 CTA head >> multiple aneurysms with 7 mm Lt PICA aneurysm as source of [facility] 1/22 ct head: Post therapy changes as noted above.Left subdural collection has decreased in size now best visualized along the convexity.Interval development of relatively isodense broad-based right subdural collection with maximal thickness of 4.8 mm causing mass effect upon the right lateral ventricle with midline shift to the left by 4.4 mm versus prior midline shift to the right by 3 mm. Slight increased size of left atrium will need to be monitored closely to exclude developing hydrocephalus.Left posterior cerebellar infarct. Resolving subarachnoid hemorrhage. Slightly patchy subcortical white matter. Mild brain edema may be present. Slight inferior displacement of the left cerebellar tonsil. 1/24 Echo >> EF [id number], no WMA, diastolic function was normal.   CULTURES:  ANTIBIOTICS: 1/12 Ancef >> 1/13  SIGNIFICANT EVENTS: 1/12 Admit, to OR 1/13 To OR >> suboccipital craniotomy, C1 laminectomy, clipping of posterior circulation aneurysm  LINES/TUBES: 1/12 Lt brachial aline >>> 1/22 1/12 Lt IJ CVL >> 1/22 1/12 ETT >> 1/14 1/12 IVD >> 1/23   DISCUSSION:  55 yo female  smoker with altered mental status from [facility] and IVH.  Found to have cerebral aneurysm. S/p clipping on 1/13. Developed acute hypoxia w/ CP on 1/22 so PCCM returning to evaluate. CTA negative for PE. Bradycardic into 20s on 1/23 requiring dopamine infusion. Bradycardia resolving but requiring pressors to maintain MAP goal.   ASSESSMENT / PLAN:  NEUROLOGIC A: Acute encephalopathy 2nd to [facility], IVH from posterior circulation aneurysm s/p clipping. Concern about right sided hypo-dense fluid collection on CT head. Neuro-surg watching P:   Continue serial neuro checks.  Nimodipine x 3 weeks Continue Keppra, consider 21 days Support MAP to lower goals now as out of window [place] and clinically progressing  PULMONARY A: Acute Hypoxic respiratory failure - etiology unclear, CTA negative. Oxygenating now to ra, no evidence PE P:   Oxygen to keep SpO2 > 92%. Reduce fluids today   CARDIOVASCULAR A: A fib with RVR - transient on admission, resolved  Hx of WPW - patient states she is s/p ablation for this, ? Whether or not bradycardia is her baseline  HTN  Chest pain - resolved, echo normal 1/23, no ischemic changes on ECG  Mildly elevated Troponin - likely Demand ischemia  Bradycardia -  HR remains in the 50s, asymptomatic  Adrenal insuff P:  Wean  levophed to off, MAP > 60 at this point is appropriate per neurosurgery  Consider weaning dopamine to assess HR, this is the main barrier to discharge at this point  We will not treat brady unless symptomatic Continue hydrocortisone 50 q6h but reduce once off pressors, consider today With h/o WEWW and ablation may need cards re assessment if symptomatic after pressors off RENAL A: No acute abnormalities  P:   Continue to monitor BMP  NS to 50   GASTROINTESTINAL A: Nutrition. Dysphagia P:   Continue aspiration precautions   HEMATOLOGIC A: Anemia of critical illness - stable, no evidence of bleeding  Leukocytosis - ? Steroids vs  hemoconcentration P:  Trend CBC  SCD's for DVT prevention.  INFECTIOUS A: No evidence for infection P:   Monitor for fever  ENDOCRINE A: Hyperglycemia. Presume relative AI P:   Continue to monitor BMP  Dc florinef Continue hydrocortisone but reduce  Ccm time 30 min    Lavon Paganini. Titus Mould, MD, [place] Pgr: [facility]

## 2016-01-16 NOTE — Progress Notes (Addendum)
I met with pt at bedside to discuss the possible need for an inpt rehab stay once medically ready. She is in agreement for me to contact her Mom to also discuss their preference. I will contact her Mom and then follow up with [employer] for a possible rehab stay. [phone] Pt does state a preference to receive her rehab closer to home/[place] area if possible due to family transportation/visitation limitations.

## 2016-01-17 LAB — GLUCOSE, CAPILLARY
Glucose-Capillary: 98 mg/dL (ref 65–99)
Glucose-Capillary: 89 mg/dL (ref 65–99)
Glucose-Capillary: 91 mg/dL (ref 65–99)

## 2016-01-17 MED ORDER — OXYCODONE-ACETAMINOPHEN 5-325 MG PO TABS: 1.0000 | ORAL_TABLET | Freq: Four times a day (QID) | ORAL | Status: DC | PRN

## 2016-01-17 MED ORDER — METHYLPREDNISOLONE 4 MG PO TABS: ORAL_TABLET | ORAL | Status: DC

## 2016-01-17 MED ORDER — NIMODIPINE 30 MG PO CAPS: 60.0000 mg | ORAL_CAPSULE | ORAL | Status: DC

## 2016-01-17 NOTE — Progress Notes (Signed)
Pt seen and examined. No issues overnight.  EXAM: Temp:  [98.1 F (36.7 C)-99.4 F (37.4 C)] 98.6 F (37 C) (01/26 0800) Pulse Rate:  [25-97] 50 (01/26 0700) Resp:  [10-30] 10 (01/26 0700) BP: (91-137)/(42-118) 108/73 mmHg (01/26 0700) SpO2:  [86 %-100 %] 100 % (01/26 0700) Weight:  [88.9 kg (195 lb 15.8 oz)] 88.9 kg (195 lb 15.8 oz) (01/26 0500) Intake/Output      01/25 0701 - 01/26 0700 01/26 0701 - 01/27 0700   I.V. (mL/kg) 1558.1 (17.5)    Total Intake(mL/kg) 1558.1 (17.5)    Urine (mL/kg/hr)     Stool     Total Output       Net +1558.1          Urine Occurrence 5 x    Stool Occurrence 2 x     Awake and alert Follows commands throughout Full strength, no drift  Stable Ok to d/c if cleared by therapy

## 2016-01-17 NOTE — Progress Notes (Signed)
Occupational Therapy Treatment Patient Details Name: [name] [name] MRN: [id number] DOB: 09-18-1961 Today's Date: 01/17/2016    History of present illness Pt is a 55 yo female smoker with altered mental status from [facility] and IVH. Found to have cerebral aneurysm, intubated 1/12, 1/13 suboccipital craniotomy, C1 laminectomy, clipping of posterior circulation aneurysm, extubated 1/14. Has IVD.    OT comments  Pt making good progress. At this time, feel pt is safe to D/C home with 24/7 S and follow up with [place] and [place] aide. Discussed recommendations with pt/family, including need to have 24/7 S, S with bathing and only getting into shower if using a tub bench. Also discussed importance of refraining from driving until cleared by MD. Pt verbalized understanding. Needs conveyed to nsg. Pt to continue with HHOT.   Follow Up Recommendations  Home health OT;Supervision/Assistance - 24 hour    Equipment Recommendations  Tub/shower bench    Recommendations for Other Services      Precautions / Restrictions Precautions Precautions: Fall       Mobility Bed Mobility                  Transfers Overall transfer level: Needs assistance Equipment used: 1 person hand held assist Transfers: Sit to/from Stand;Stand Pivot Transfers Sit to Stand: Supervision Stand pivot transfers: Supervision            Balance Overall balance assessment: Needs assistance   Sitting balance-Leahy Scale: Good       Standing balance-Leahy Scale: Fair                     ADL Overall ADL's : Needs assistance/impaired                                     Functional mobility during ADLs: Supervision/safety General ADL Comments: Completed ADL session with overall S. 1 LOB during ADL. Ablet oretrieve item from floor and demonstrate ability to recover from loss of balance. Pt initially stated that she felt she was close to her baseline, but at the end of the session, pt stated she  realized she needed to "take it easy". Discussed safety concerns, including need for intitial 24/7 S, refraining from driving until released by MD, refraining from using the stove until cleared by [facility] and having S during bathing. Recommended for pt to use a tub bench to avoid stepping over tub adn reducing risk of falls. Family present and verbalized understanding. Pt states she feels "woozy" at times.       Vision    need to further assess                 Perception     Praxis      Cognition   Behavior During Therapy: [facility] for tasks assessed/performed Overall Cognitive Status: No family/caregiver present to determine baseline cognitive functioning       Memory: Decreased short-term memory      Awareness: Emergent Problem Solving: Slow processing      Extremity/Trunk Assessment               Exercises     Shoulder Instructions       General Comments      Pertinent Vitals/ Pain       Pain Assessment: No/denies pain         VSS  Home Living  Prior Functioning/Environment              Frequency Min 3X/week     Progress Toward Goals  OT Goals(current goals can now be found in the care plan section)  Progress towards OT goals: Goals met/education completed, patient discharged from OT (acute ot. Continue with HHOT.)  Acute Rehab OT Goals Patient Stated Goal: to go home OT Goal Formulation: With patient Time For Goal Achievement: 01/29/16 Potential to Achieve Goals: Good ADL Goals Pt Will Perform Grooming: with supervision;standing Pt Will Perform Upper Body Bathing: with supervision;sitting;standing Pt Will Perform Lower Body Bathing: with supervision;sit to/from stand Pt Will Perform Upper Body Dressing: with supervision;standing;sitting Pt Will Perform Lower Body Dressing: with supervision;sit to/from stand Pt Will Transfer to Toilet: with supervision;ambulating;regular height  toilet;grab bars Pt Will Perform Toileting - Clothing Manipulation and hygiene: with supervision;sit to/from stand Additional ADL Goal #1: Pt will show increased safety awareness with gathering items for basic ADLs  Plan Discharge plan needs to be updated    Co-evaluation                 End of Session Equipment Utilized During Treatment: Gait belt   Activity Tolerance Patient tolerated treatment well   Patient Left Other (comment) (with PT)   Nurse Communication Mobility status        Time: 4132-4401 OT Time Calculation (min): 25 min  Charges: OT General Charges $OT Visit: 1 Procedure OT Treatments $Self Care/Home Management : 23-37 mins  Abryanna Musolino,HILLARY 01/17/2016, 1:56 PM   [facility], OTR/L  [phone] 01/17/2016

## 2016-01-17 NOTE — Progress Notes (Signed)
I met with pt at bedside in chair. She is asking if she can go home today. I explained that therapy is scheduled to see her today which will determine if she has progressed well enough to d/c home or if rehab will be needed prior to d/c home. If rehab is needed, pt prefers to receive this in the [place] area. I have contacted RN CM and SW and they are aware. I will contact P.T. To give this update. We will sign off. [phone]

## 2016-01-17 NOTE — Progress Notes (Signed)
She has progressed well Off all pressors To rehab Will sign off  Lavon Paganini. Titus Mould, MD, [place] Pgr: [place] [facility]

## 2016-01-17 NOTE — Progress Notes (Signed)
Physical Therapy Treatment Patient Details Name: [name] [name] MRN: [id number] DOB: 01/01/1961 Today's Date: 01/17/2016    History of Present Illness Pt is a 55 yo female smoker with altered mental status from [facility] and IVH. Found to have cerebral aneurysm, intubated 1/12, 1/13 suboccipital craniotomy, C1 laminectomy, clipping of posterior circulation aneurysm, extubated 1/14. Has IVD.     PT Comments    Progressing well.  Pt showed therapist how well she was doing with transfers, gait stability and stairs.   She does show some signs of slowed processing and mildly impaired safety awareness, but with should be safe at home with her Mom's supervision/assist.   Follow Up Recommendations  Home health PT     Equipment Recommendations  None recommended by PT    Recommendations for Other Services Rehab consult;OT consult     Precautions / Restrictions Precautions Precautions: Fall Precaution Comments: IVC removed    Mobility  Bed Mobility                  Transfers Overall transfer level: Needs assistance Equipment used: 1 person hand held assist Transfers: Sit to/from Stand Sit to Stand: Supervision Stand pivot transfers: Supervision          Ambulation/Gait Ambulation/Gait assistance: Min guard Ambulation Distance (Feet): 200 Feet Assistive device: None (and carrying her portable monitor) Gait Pattern/deviations: Step-through pattern;Decreased step length - right;Decreased step length - left     General Gait Details: pt with short, shuffled or lower amplitude steps.  Little to no dissociation of trunk, no turning of her head to scan.   Stairs Stairs: Yes   Stair Management: One rail Right;Two rails;Step to pattern;Forwards Number of Stairs: 4 General stair comments: safe with rails if not guarded.  Wheelchair Mobility    Modified Rankin (Stroke Patients Only) Modified Rankin (Stroke Patients Only) Modified Rankin: Moderate disability     Balance  Overall balance assessment: Needs assistance   Sitting balance-Leahy Scale: Good       Standing balance-Leahy Scale: Fair                      Cognition Arousal/Alertness: Awake/alert Behavior During Therapy: WFL for tasks assessed/performed Overall Cognitive Status: No family/caregiver present to determine baseline cognitive functioning       Memory: Decreased short-term memory     Awareness: Emergent Problem Solving: Slow processing      Exercises      General Comments        Pertinent Vitals/Pain Pain Assessment: No/denies pain    Home Living                      Prior Function            PT Goals (current goals can now be found in the care plan section) Acute Rehab PT Goals Patient Stated Goal: to go home PT Goal Formulation: With patient Time For Goal Achievement: 01/26/16 Potential to Achieve Goals: Good Progress towards PT goals: Progressing toward goals    Frequency  Min 3X/week    PT Plan Current plan remains appropriate    Co-evaluation             End of Session   Activity Tolerance: Patient tolerated treatment well Patient left: in chair;with call bell/phone within reach;with family/visitor present     Time: 1204-1224 PT Time Calculation (min) (ACUTE ONLY): 20 min  Charges:  $Gait Training: 8-22 mins  G Codes:      [name], Tessie Fass 01/17/2016, 3:47 PM 01/17/2016  Donnella Sham, PT [phone]  (pager)

## 2016-01-17 NOTE — Clinical Social Work Note (Signed)
Clinical Social Worker continuing to follow patient and family for support and discharge planning needs.  Patient has been working with therapies and progressed enough to return home with home health.  CM notified to arrange home health prior to discharge.  Clinical Social Worker will sign off for now as social work intervention is no longer needed. Please consult [place] again if new need arises.  Barbette Or, [place]

## 2016-01-17 NOTE — Care Management Note (Signed)
Case Management Note  Patient Details  Name: [name] [name] MRN: [id number] Date of Birth: 06-08-1961  Subjective/Objective:   Pt cleared by PT/OT to go home with her mother and [facility] services.  Met with pt and ex-husband to discuss home arrangements.                   Action/Plan: Referral to [facility] for [facility] follow up, per pt choice.  Start of care 24-48h post dc date.  OT recommending tub bench for home, and pt states she would like this.  Pt/family member aware that tub bench not covered by insurance.  [employer] rep to bring DME to pt's room prior to dc home.    Expected Discharge Date:   01/17/2016               Expected Discharge Plan:  [place]  In-House Referral:     Discharge planning Services  CM Consult  Post Acute Care Choice:  Home Health Choice offered to:  Patient  DME Arranged:  Tub bench DME Agency:  [place] Arranged:  RN, PT, OT, Nurse's Aide [facility] Agency:  [place]  Status of Service:  Completed, signed off  Medicare Important Message Given:    Date Medicare IM Given:    Medicare IM give by:    Date Additional Medicare IM Given:    Additional Medicare Important Message give by:     If discussed at [place] of Stay Meetings, dates discussed:    Additional Comments:  Reinaldo Raddle, RN, BSN  Trauma/Neuro ICU Case Manager [phone]

## 2016-01-17 NOTE — Discharge Summary (Signed)
Date of Admission: 01/03/16  Date of Discharge: 01/17/16  Admission Diagnosis: Subarachnoid hemorrhage, ruptured left PICA aneurysm  Discharge Diagnosis: Subarachnoid hemorrhage, ruptured left PICA aneurysm  Procedure Performed: Suboccipital craniotomy for trapping of left PICA aneurysm, placement of external ventricular drain  Attending: Bexlee [name]  Hospital Course:  [name] [name] was admitted with a subarachnoid hemorrhage from a ruptured left PICA aneurysm.  She was taken to the OR after coiling was unsuccessful.  She had an uneventful post-operative course.  She was observed in the ICU for 14 days during the usual vasospasm window.  She did not manifest clinical signs of vasospasm.  She is discharged at this time in stable medical and neurological condition.  Discharged Medications: Percocet, nimodipine, medrol dose pak  Follow up: With me in 2 weeks

## 2016-02-03 ENCOUNTER — Encounter (HOSPITAL_COMMUNITY): Payer: Self-pay | Admitting: Emergency Medicine

## 2016-02-03 ENCOUNTER — Inpatient Hospital Stay (HOSPITAL_COMMUNITY): Payer: BLUE CROSS/BLUE SHIELD

## 2016-02-03 ENCOUNTER — Emergency Department (HOSPITAL_COMMUNITY): Payer: BLUE CROSS/BLUE SHIELD

## 2016-02-03 ENCOUNTER — Inpatient Hospital Stay (HOSPITAL_COMMUNITY)
Admission: EM | Admit: 2016-02-03 | Discharge: 2016-02-06 | DRG: 033 | Disposition: A | Discharge status: Home Health | Payer: BLUE CROSS/BLUE SHIELD | Attending: Neurological Surgery | Admitting: Neurological Surgery

## 2016-02-03 DIAGNOSIS — G9619 Other disorders of meninges, not elsewhere classified: Secondary | ICD-10-CM | POA: Diagnosis present

## 2016-02-03 DIAGNOSIS — G910 Communicating hydrocephalus: Secondary | ICD-10-CM | POA: Diagnosis present

## 2016-02-03 DIAGNOSIS — I456 Pre-excitation syndrome: Secondary | ICD-10-CM | POA: Diagnosis present

## 2016-02-03 DIAGNOSIS — F17200 Nicotine dependence, unspecified, uncomplicated: Secondary | ICD-10-CM | POA: Diagnosis present

## 2016-02-03 DIAGNOSIS — R51 Headache: Secondary | ICD-10-CM | POA: Diagnosis present

## 2016-02-03 DIAGNOSIS — I10 Essential (primary) hypertension: Secondary | ICD-10-CM | POA: Diagnosis present

## 2016-02-03 DIAGNOSIS — G919 Hydrocephalus, unspecified: Secondary | ICD-10-CM

## 2016-02-03 LAB — COMPREHENSIVE METABOLIC PANEL
ALT: 22 U/L (ref 14–54)
AST: 20 U/L (ref 15–41)
Albumin: 4.1 g/dL (ref 3.5–5.0)
Alkaline Phosphatase: 57 U/L (ref 38–126)
Anion gap: 12 (ref 5–15)
BUN: 14 mg/dL (ref 6–20)
CO2: 23 mmol/L (ref 22–32)
Calcium: 10.1 mg/dL (ref 8.9–10.3)
Chloride: 107 mmol/L (ref 101–111)
Creatinine, Ser: 0.97 mg/dL (ref 0.44–1.00)
GFR calc Af Amer: >60 mL/min (ref >60)
GFR calc non Af Amer: >60 mL/min (ref >60)
Glucose, Bld: 86 mg/dL (ref 65–99)
Potassium: 4.4 mmol/L (ref 3.5–5.1)
Sodium: 142 mmol/L (ref 135–145)
Total Bilirubin: 0.5 mg/dL (ref 0.3–1.2)
Total Protein: 6.7 g/dL (ref 6.5–8.1)

## 2016-02-03 LAB — CBC WITH DIFFERENTIAL/PLATELET
Basophils Absolute: 0 10*3/uL (ref 0.0–0.1)
Basophils Relative: 1 %
Eosinophils Absolute: 0 10*3/uL (ref 0.0–0.7)
Eosinophils Relative: 1 %
HCT: 37 % (ref 36.0–46.0)
Hemoglobin: 11.4 g/dL — ABNORMAL LOW (ref 12.0–15.0)
Lymphocytes Relative: 20 %
Lymphs Abs: 1.1 10*3/uL (ref 0.7–4.0)
MCH: 27.4 pg (ref 26.0–34.0)
MCHC: 30.8 g/dL (ref 30.0–36.0)
MCV: 88.9 fL (ref 78.0–100.0)
Monocytes Absolute: 0.5 10*3/uL (ref 0.1–1.0)
Monocytes Relative: 9 %
Neutro Abs: 3.9 10*3/uL (ref 1.7–7.7)
Neutrophils Relative %: 69 %
Platelets: 272 10*3/uL (ref 150–400)
RBC: 4.16 MIL/uL (ref 3.87–5.11)
RDW: 14.9 % (ref 11.5–15.5)
WBC: 5.6 10*3/uL (ref 4.0–10.5)

## 2016-02-03 LAB — I-STAT CHEM 8, ED
BUN: 16 mg/dL (ref 6–20)
Calcium, Ion: 1.16 mmol/L (ref 1.12–1.23)
Chloride: 106 mmol/L (ref 101–111)
Creatinine, Ser: 0.9 mg/dL (ref 0.44–1.00)
Glucose, Bld: 86 mg/dL (ref 65–99)
HCT: 38 % (ref 36.0–46.0)
Hemoglobin: 12.9 g/dL (ref 12.0–15.0)
Potassium: 4.2 mmol/L (ref 3.5–5.1)
Sodium: 142 mmol/L (ref 135–145)
TCO2: 24 mmol/L (ref 0–100)

## 2016-02-03 LAB — LIPASE, BLOOD: Lipase: 22 U/L (ref 11–51)

## 2016-02-03 LAB — TROPONIN I: Troponin I: <0.03 ng/mL (ref <0.031)

## 2016-02-03 MED ORDER — ONDANSETRON HCL 4 MG PO TABS: 4.0000 mg | ORAL_TABLET | Freq: Four times a day (QID) | ORAL | Status: DC | PRN

## 2016-02-03 MED ORDER — SODIUM CHLORIDE 0.9 % IV SOLN: 250.0000 mL | INTRAVENOUS | Status: DC | PRN

## 2016-02-03 MED ORDER — SODIUM CHLORIDE 0.9 % IV BOLUS (SEPSIS)
1000.0000 mL | Freq: Once | INTRAVENOUS | Status: AC
  Administered 2016-02-03: 1000 mL via INTRAVENOUS

## 2016-02-03 MED ORDER — HYDROCODONE-ACETAMINOPHEN 5-325 MG PO TABS
1.0000 | ORAL_TABLET | ORAL | Status: DC | PRN
Start: 2016-02-03 — End: 2016-02-06
  Administered 2016-02-04 – 2016-02-06 (×5): 2 via ORAL
  Filled 2016-02-03 (×5): qty 2

## 2016-02-03 MED ORDER — HYDROMORPHONE HCL 1 MG/ML IJ SOLN
0.5000 mg | INTRAMUSCULAR | Status: DC | PRN
  Administered 2016-02-04: 0.5 mg via INTRAVENOUS
  Filled 2016-02-03: qty 1

## 2016-02-03 MED ORDER — PRAVASTATIN SODIUM 20 MG PO TABS: 20.0000 mg | ORAL_TABLET | Freq: Every day | ORAL | Status: DC

## 2016-02-03 MED ORDER — ACETAMINOPHEN 650 MG RE SUPP: 650.0000 mg | Freq: Four times a day (QID) | RECTAL | Status: DC | PRN

## 2016-02-03 MED ORDER — ALUM & MAG HYDROXIDE-SIMETH 200-200-20 MG/5ML PO SUSP: 30.0000 mL | Freq: Four times a day (QID) | ORAL | Status: DC | PRN

## 2016-02-03 MED ORDER — SODIUM CHLORIDE 0.9% FLUSH: 3.0000 mL | INTRAVENOUS | Status: DC | PRN

## 2016-02-03 MED ORDER — SODIUM CHLORIDE 0.9% FLUSH
3.0000 mL | Freq: Two times a day (BID) | INTRAVENOUS | Status: DC
  Administered 2016-02-04 – 2016-02-05 (×5): 3 mL via INTRAVENOUS

## 2016-02-03 MED ORDER — ONDANSETRON HCL 4 MG/2ML IJ SOLN
4.0000 mg | Freq: Once | INTRAMUSCULAR | Status: AC
  Administered 2016-02-03: 4 mg via INTRAVENOUS
  Filled 2016-02-03: qty 2

## 2016-02-03 MED ORDER — ACETAMINOPHEN 325 MG PO TABS: 650.0000 mg | ORAL_TABLET | Freq: Four times a day (QID) | ORAL | Status: DC | PRN

## 2016-02-03 MED ORDER — NIMODIPINE 30 MG PO CAPS
60.0000 mg | ORAL_CAPSULE | ORAL | Status: DC
  Administered 2016-02-04 (×3): 60 mg via ORAL
  Filled 2016-02-03 (×6): qty 2

## 2016-02-03 MED ORDER — ONDANSETRON HCL 4 MG/2ML IJ SOLN: 4.0000 mg | Freq: Four times a day (QID) | INTRAMUSCULAR | Status: DC | PRN

## 2016-02-03 NOTE — ED Notes (Signed)
Patient transported to CT 

## 2016-02-03 NOTE — ED Notes (Signed)
Pt stated she does not have to urinate at this time and is unable to provide a sample.

## 2016-02-03 NOTE — H&P (Signed)
[name] [name] is an 56 y.o. female.   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 Patient presents with increasing headache and lethargy. Symptoms have been slowly progressive. No fever. No seizure. No new symptoms of weakness numbness or paresthesias.  Head CT scan in emergency department demonstrates progressive hydrocephalus involving lateral, third and fourth ventricle consistent with progressive communicating hydrocephalus.  Past Medical History  Diagnosis Date  . SVT (supraventricular tachycardia) ([place])   . Hypertension   . WPW (Wolff-Parkinson-White syndrome)     Past Surgical History  Procedure Laterality Date  . Radiology with anesthesia N/A 01/03/2016    Procedure: RADIOLOGY WITH ANESTHESIA;  Surgeon: Medication Radiologist, MD;  Location: [facility];  Service: Radiology;  Laterality: N/A;  . Craniotomy N/A 01/04/2016    Procedure: Suboccipital Craniotomy and Cervical one Laminectomy for Clipping of Aneurysm;  Surgeon: Kevan Ny Ditty, MD;  Location: [facility];  Service: Neurosurgery;  Laterality: N/A;    No family history on file. Social History:  reports that she has been smoking.  She does not have any smokeless tobacco history on file. She reports that she does not drink alcohol or use illicit drugs.  Allergies: No Known Allergies   (Not in a hospital admission)  Results for orders placed or performed during the hospital encounter of 02/03/16 (from the past 48 hour(s))  Comprehensive metabolic panel     Status: None   Collection Time: 02/03/16  7:18 PM  Result Value Ref Range   Sodium 142 135 - 145 mmol/L   Potassium 4.4 3.5 - 5.1 mmol/L   Chloride 107 101 - 111 mmol/L   CO2 23 22 - 32 mmol/L   Glucose, Bld 86 65 - 99 mg/dL   BUN 14 6 - 20 mg/dL   Creatinine, Ser 0.97 0.44 - 1.00 mg/dL   Calcium 10.1 8.9 - 10.3 mg/dL   Total Protein 6.7 6.5 - 8.1 g/dL   Albumin 4.1  3.5 - 5.0 g/dL   AST 20 15 - 41 U/L   ALT 22 14 - 54 U/L   Alkaline Phosphatase 57 38 - 126 U/L   Total Bilirubin 0.5 0.3 - 1.2 mg/dL   GFR calc non Af Amer >60 >60 mL/min   GFR calc Af Amer >60 >60 mL/min    Comment: (NOTE) The eGFR has been calculated using the CKD EPI equation. This calculation has not been validated in all clinical situations. eGFR's persistently <60 mL/min signify possible Chronic Kidney Disease.    Anion gap 12 5 - 15  Lipase, blood     Status: None   Collection Time: 02/03/16  7:18 PM  Result Value Ref Range   Lipase 22 11 - 51 U/L  Troponin I     Status: None   Collection Time: 02/03/16  7:18 PM  Result Value Ref Range   Troponin I <0.03 <0.031 ng/mL    Comment:        NO INDICATION OF MYOCARDIAL INJURY.   CBC with Differential     Status: Abnormal   Collection Time: 02/03/16  7:18 PM  Result Value Ref Range   WBC 5.6 4.0 - 10.5 K/uL   RBC 4.16 3.87 - 5.11 MIL/uL   Hemoglobin 11.4 (L) 12.0 - 15.0 g/dL   HCT 37.0 36.0 - 46.0 %   MCV 88.9 78.0 - 100.0 fL   MCH 27.4 26.0 - 34.0 pg   MCHC 30.8 30.0 -  36.0 g/dL   RDW 14.9 11.5 - 15.5 %   Platelets 272 150 - 400 K/uL   Neutrophils Relative % 69 %   Neutro Abs 3.9 1.7 - 7.7 K/uL   Lymphocytes Relative 20 %   Lymphs Abs 1.1 0.7 - 4.0 K/uL   Monocytes Relative 9 %   Monocytes Absolute 0.5 0.1 - 1.0 K/uL   Eosinophils Relative 1 %   Eosinophils Absolute 0.0 0.0 - 0.7 K/uL   Basophils Relative 1 %   Basophils Absolute 0.0 0.0 - 0.1 K/uL  I-stat chem 8, ed     Status: None   Collection Time: 02/03/16  7:47 PM  Result Value Ref Range   Sodium 142 135 - 145 mmol/L   Potassium 4.2 3.5 - 5.1 mmol/L   Chloride 106 101 - 111 mmol/L   BUN 16 6 - 20 mg/dL   Creatinine, Ser 0.90 0.44 - 1.00 mg/dL   Glucose, Bld 86 65 - 99 mg/dL   Calcium, Ion 1.16 1.12 - 1.23 mmol/L   TCO2 24 0 - 100 mmol/L   Hemoglobin 12.9 12.0 - 15.0 g/dL   HCT 38.0 36.0 - 46.0 %   Ct Head Wo Contrast  02/03/2016  CLINICAL DATA:   55 year old female with prior complete presenting with listlessness EXAM: CT HEAD WITHOUT CONTRAST TECHNIQUE: Contiguous axial images were obtained from the base of the skull through the vertex without intravenous contrast. COMPARISON:  CT dated 01/13/2016 FINDINGS: No acute intracranial hemorrhage. There has been interval removal of the previously seen right frontal ventriculostomy shunt with interval development of mild to moderate hydrocephalus. There is diffuse mass effect caused by the ventricular dilatation with diffuse effacement of the sulci. Mild diffuse brain edema may be present. There is no midline shift. There is minimal effacement of the quadrigeminal plate cistern. An impending uncal herniation is not excluded. Clinical correlation and close monitoring recommended. Periventricular and deep white matter chronic microvascular ischemic changes as well as stable appearing old left cerebellar infarct. 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 The aneurysm clip again noted abutting the posterior medulla. There is a 3.1 x 5.0 cm fluid collection at the operative bed which is increased in size compared to the prior study when it measured approximately 2.5 x 3.2 cm IMPRESSION: No acute intracranial hemorrhage. Interval removal of the right frontal ventriculostomy with development of hydrocephalus. Mild cerebral edema in combination with hydrocephalus cause diffuse mass effect and effacement of the sulci. No midline shift. Mild effacement of the quadrigeminal plate cistern. Close follow-up recommended. Stable suboccipital craniectomy. These results were called by telephone at the time of interpretation on 02/03/2016 at 8:04 pm to Dr. Carmin Muskrat , who verbally acknowledged these results. Electronically Signed   By: Anner Crete M.D.   On: 02/03/2016 20:09    Pertinent items are noted in HPI.  Blood pressure 121/78, pulse 85,  temperature 98.5 F (36.9 C), temperature source Oral, resp. rate 18, SpO2 99 %.  The patient is awake and alert. She is a little sluggish but answers questions appropriately. She is oriented 4. Her speech is reasonably fluent. Her judgment and insight appear intact. Her cranial nerve function is normal. Motor 5/5 bilaterally. Sensory examination nonfocal. Wound well-healed. There is some bulging beneath the incision. No evidence of infection. No meningeal signs. Examination head ears eyes nose and throat unremarkable otherwise. Examination of her neck finds her airway to be midline. Carotid pulses are normal. Chest and  abdomen are benign. Assessment/Plan Progressive communicating hydrocephalus. Plan admission to hospital for probable VP shunting. I will discussed situation with her operative surgeon tomorrow.  Ivyanna Sibert A 02/03/2016, 8:42 PM

## 2016-02-03 NOTE — ED Notes (Addendum)
Pt coming from home via EMS, EMS reports weakness beginning Thursday, pt unable to eat, N/V beginning 1400 today.  Pt reports HA since surgery 3 weeks ago.  EMS reports neuro intact, no focal deficits.  Pt denies LOC.  NAD noted at this time, pt AOx4.

## 2016-02-04 ENCOUNTER — Encounter (HOSPITAL_COMMUNITY)
Admission: EM | Disposition: A | Discharge status: Home Health | Payer: Self-pay | Source: Home / Self Care | Attending: Neurological Surgery

## 2016-02-04 ENCOUNTER — Inpatient Hospital Stay (HOSPITAL_COMMUNITY): Payer: BLUE CROSS/BLUE SHIELD | Admitting: Anesthesiology

## 2016-02-04 HISTORY — PX: LAPAROSCOPIC REVISION VENTRICULAR-PERITONEAL (V-P) SHUNT: SHX5924

## 2016-02-04 HISTORY — PX: VENTRICULOPERITONEAL SHUNT: SHX204

## 2016-02-04 LAB — GLUCOSE, CAPILLARY: Glucose-Capillary: 86 mg/dL (ref 65–99)

## 2016-02-04 SURGERY — SHUNT INSERTION VENTRICULAR-PERITONEAL
Anesthesia: General | Site: Head | Laterality: Right

## 2016-02-04 MED ORDER — PROPOFOL 10 MG/ML IV BOLUS
INTRAVENOUS | Status: AC
  Filled 2016-02-04: qty 20

## 2016-02-04 MED ORDER — SODIUM CHLORIDE 0.9 % IR SOLN
Status: DC | PRN
  Administered 2016-02-04: 18:00:00

## 2016-02-04 MED ORDER — HEMOSTATIC AGENTS (NO CHARGE) OPTIME
TOPICAL | Status: DC | PRN
  Administered 2016-02-04: 1 via TOPICAL

## 2016-02-04 MED ORDER — FENTANYL CITRATE (PF) 250 MCG/5ML IJ SOLN
INTRAMUSCULAR | Status: AC
  Filled 2016-02-04: qty 5

## 2016-02-04 MED ORDER — HYDROMORPHONE HCL 1 MG/ML IJ SOLN
0.2500 mg | INTRAMUSCULAR | Status: DC | PRN
  Administered 2016-02-04 (×2): 0.5 mg via INTRAVENOUS

## 2016-02-04 MED ORDER — LACTATED RINGERS IV SOLN
INTRAVENOUS | Status: DC | PRN
  Administered 2016-02-04: 16:00:00 via INTRAVENOUS

## 2016-02-04 MED ORDER — HYDROMORPHONE HCL 1 MG/ML IJ SOLN: 0.2500 mg | INTRAMUSCULAR | Status: DC | PRN

## 2016-02-04 MED ORDER — CEFAZOLIN SODIUM-DEXTROSE 2-3 GM-% IV SOLR
INTRAVENOUS | Status: DC | PRN
  Administered 2016-02-04: 2 g via INTRAVENOUS

## 2016-02-04 MED ORDER — HYDROMORPHONE HCL 1 MG/ML IJ SOLN
INTRAMUSCULAR | Status: AC
  Administered 2016-02-04: 0.5 mg via INTRAVENOUS
  Filled 2016-02-04: qty 1

## 2016-02-04 MED ORDER — FENTANYL CITRATE (PF) 100 MCG/2ML IJ SOLN
INTRAMUSCULAR | Status: DC | PRN
  Administered 2016-02-04: 50 ug via INTRAVENOUS
  Administered 2016-02-04: 100 ug via INTRAVENOUS

## 2016-02-04 MED ORDER — PROMETHAZINE HCL 25 MG/ML IJ SOLN: 6.2500 mg | INTRAMUSCULAR | Status: DC | PRN

## 2016-02-04 MED ORDER — SUGAMMADEX SODIUM 200 MG/2ML IV SOLN
INTRAVENOUS | Status: DC | PRN
  Administered 2016-02-04: 159.4 mg via INTRAVENOUS

## 2016-02-04 MED ORDER — VANCOMYCIN HCL 1000 MG IV SOLR
INTRAVENOUS | Status: AC
Start: 2016-02-04 — End: 2016-02-05
  Filled 2016-02-04: qty 1000

## 2016-02-04 MED ORDER — ONDANSETRON HCL 4 MG/2ML IJ SOLN
INTRAMUSCULAR | Status: DC | PRN
  Administered 2016-02-04: 4 mg via INTRAVENOUS

## 2016-02-04 MED ORDER — SUGAMMADEX SODIUM 200 MG/2ML IV SOLN
INTRAVENOUS | Status: AC
  Filled 2016-02-04: qty 2

## 2016-02-04 MED ORDER — ROCURONIUM BROMIDE 100 MG/10ML IV SOLN
INTRAVENOUS | Status: DC | PRN
  Administered 2016-02-04: 40 mg via INTRAVENOUS
  Administered 2016-02-04: 10 mg via INTRAVENOUS

## 2016-02-04 MED ORDER — 0.9 % SODIUM CHLORIDE (POUR BTL) OPTIME
TOPICAL | Status: DC | PRN
  Administered 2016-02-04: 1000 mL

## 2016-02-04 MED ORDER — VANCOMYCIN HCL 1000 MG IV SOLR
INTRAVENOUS | Status: DC | PRN
  Administered 2016-02-04: 1000 mg via TOPICAL

## 2016-02-04 MED ORDER — THROMBIN 5000 UNITS EX SOLR
CUTANEOUS | Status: DC | PRN
  Administered 2016-02-04 (×2): 5000 [IU] via TOPICAL

## 2016-02-04 MED ORDER — PROPOFOL 10 MG/ML IV BOLUS
INTRAVENOUS | Status: DC | PRN
  Administered 2016-02-04: 50 mg via INTRAVENOUS
  Administered 2016-02-04: 120 mg via INTRAVENOUS

## 2016-02-04 SURGICAL SUPPLY — 97 items
ADH SKN CLS LQ APL DERMABOND (GAUZE/BANDAGES/DRESSINGS) ×2
APL SKNCLS STERI-STRIP NONHPOA (GAUZE/BANDAGES/DRESSINGS)
BENZOIN TINCTURE PRP APPL 2/3 (GAUZE/BANDAGES/DRESSINGS) IMPLANT
BLADE CLIPPER SURG (BLADE) ×5 IMPLANT
BLADE SURG 10 STRL SS (BLADE) ×2 IMPLANT
BLADE SURG 11 STRL SS (BLADE) ×3 IMPLANT
BLADE SURG 15 STRL LF DISP TIS (BLADE) ×2 IMPLANT
BLADE SURG 15 STRL SS (BLADE) ×3
BOOT SUTURE AID YELLOW STND (SUTURE) ×1 IMPLANT
BRUSH SCRUB EZ 1% IODOPHOR (MISCELLANEOUS) ×2 IMPLANT
BUR ACORN 6.0 PRECISION (BURR) ×3 IMPLANT
CANISTER SUCT 3000ML PPV (MISCELLANEOUS) ×3 IMPLANT
CATH SNAP PUDENZ 6CM (CATHETERS) ×2 IMPLANT
CHLORAPREP W/TINT 26ML (MISCELLANEOUS) ×2 IMPLANT
CLIP RANEY DISP (INSTRUMENTS) IMPLANT
CONTROL ASSY CSF FLOW (MISCELLANEOUS) ×1 IMPLANT
CORDS BIPOLAR (ELECTRODE) IMPLANT
COVER MAYO STAND STRL (DRAPES) ×2 IMPLANT
CSF Shunt Assembly, Contoured Regular, Medium Pres ×1 IMPLANT
DECANTER SPIKE VIAL GLASS SM (MISCELLANEOUS) ×3 IMPLANT
DERMABOND ADHESIVE PROPEN (GAUZE/BANDAGES/DRESSINGS) ×1
DERMABOND ADVANCED .7 DNX6 (GAUZE/BANDAGES/DRESSINGS) ×2 IMPLANT
DRAPE INCISE IOBAN 66X45 STRL (DRAPES) ×2 IMPLANT
DRAPE INCISE IOBAN 85X60 (DRAPES) ×1 IMPLANT
DRAPE ORTHO SPLIT 77X108 STRL (DRAPES) ×6
DRAPE POUCH INSTRU U-SHP 10X18 (DRAPES) ×3 IMPLANT
DRAPE PROXIMA HALF (DRAPES) ×1 IMPLANT
DRAPE SURG ORHT 6 SPLT 77X108 (DRAPES) ×3 IMPLANT
DRSG MEPILEX BORDER 4X8 (GAUZE/BANDAGES/DRESSINGS) ×2 IMPLANT
DURAPREP 26ML APPLICATOR (WOUND CARE) ×4 IMPLANT
ELECT CAUTERY BLADE 6.4 (BLADE) ×2 IMPLANT
ELECT REM PT RETURN 9FT ADLT (ELECTROSURGICAL) ×3
ELECTRODE REM PT RTRN 9FT ADLT (ELECTROSURGICAL) ×2 IMPLANT
GAUZE SPONGE 2X2 8PLY STRL LF (GAUZE/BANDAGES/DRESSINGS) IMPLANT
GAUZE SPONGE 4X4 16PLY XRAY LF (GAUZE/BANDAGES/DRESSINGS) ×5 IMPLANT
GLOVE BIOGEL PI IND STRL 8 (GLOVE) ×2 IMPLANT
GLOVE BIOGEL PI INDICATOR 8 (GLOVE) ×1
GLOVE ECLIPSE 7.0 STRL STRAW (GLOVE) ×3 IMPLANT
GLOVE ECLIPSE 8.0 STRL XLNG CF (GLOVE) ×3 IMPLANT
GLOVE EXAM NITRILE LRG STRL (GLOVE) IMPLANT
GLOVE EXAM NITRILE MD LF STRL (GLOVE) IMPLANT
GLOVE EXAM NITRILE XL STR (GLOVE) IMPLANT
GLOVE EXAM NITRILE XS STR PU (GLOVE) IMPLANT
GLOVE INDICATOR 7.5 STRL GRN (GLOVE) ×6 IMPLANT
GOWN STRL NON-REIN LRG LVL3 (GOWN DISPOSABLE) ×5 IMPLANT
GOWN STRL REUS W/ TWL LRG LVL3 (GOWN DISPOSABLE) ×4 IMPLANT
GOWN STRL REUS W/ TWL XL LVL3 (GOWN DISPOSABLE) IMPLANT
GOWN STRL REUS W/TWL 2XL LVL3 (GOWN DISPOSABLE) IMPLANT
GOWN STRL REUS W/TWL LRG LVL3 (GOWN DISPOSABLE) ×6
GOWN STRL REUS W/TWL XL LVL3 (GOWN DISPOSABLE) ×6
HEMOSTAT SURGICEL 2X14 (HEMOSTASIS) IMPLANT
HIGH FLOW INSUFFLATOR TUBING ×3 IMPLANT
KIT BASIN OR (CUSTOM PROCEDURE TRAY) ×3 IMPLANT
KIT ROOM TURNOVER OR (KITS) ×3 IMPLANT
LIQUID BAND (GAUZE/BANDAGES/DRESSINGS) IMPLANT
MARKER SKIN DUAL TIP RULER LAB (MISCELLANEOUS) ×3 IMPLANT
NDL HYPO 21X1.5 SAFETY (NEEDLE) ×2 IMPLANT
NDL HYPO 25X1 1.5 SAFETY (NEEDLE) ×2 IMPLANT
NEEDLE HYPO 21X1.5 SAFETY (NEEDLE) ×3 IMPLANT
NEEDLE HYPO 25X1 1.5 SAFETY (NEEDLE) ×3 IMPLANT
NS IRRIG 1000ML POUR BTL (IV SOLUTION) ×3 IMPLANT
PACK EENT II TURBAN DRAPE (CUSTOM PROCEDURE TRAY) ×2 IMPLANT
PACK LAMINECTOMY NEURO (CUSTOM PROCEDURE TRAY) ×2 IMPLANT
PAD ARMBOARD 7.5X6 YLW CONV (MISCELLANEOUS) ×9 IMPLANT
PASSER CATH 65CM DISP (NEUROSURGERY SUPPLIES) ×3 IMPLANT
PATTIES SURGICAL .5 X.5 (GAUZE/BANDAGES/DRESSINGS) IMPLANT
PENCIL BUTTON HOLSTER BLD 10FT (ELECTRODE) ×2 IMPLANT
RESERVOIR RICKHAM ×2 IMPLANT
SHEATH COOK PEEL AWAY SET 9F (SHEATH) ×3 IMPLANT
SLEEVE ENDOPATH XCEL 5M (ENDOMECHANICALS) ×4 IMPLANT
SPONGE GAUZE 2X2 STER 10/PKG (GAUZE/BANDAGES/DRESSINGS)
SPONGE LAP 4X18 X RAY DECT (DISPOSABLE) ×2 IMPLANT
SPONGE SURGIFOAM ABS GEL 12-7 (HEMOSTASIS) IMPLANT
STAPLER SKIN PROX WIDE 3.9 (STAPLE) ×3 IMPLANT
STRIP CLOSURE SKIN 1/2X4 (GAUZE/BANDAGES/DRESSINGS) IMPLANT
STRIP CLOSURE SKIN 1/4X4 (GAUZE/BANDAGES/DRESSINGS) IMPLANT
SUT BONE WAX W31G (SUTURE) ×1 IMPLANT
SUT ETHILON 3 0 PS 1 (SUTURE) ×2 IMPLANT
SUT MON AB 4-0 PC3 18 (SUTURE) ×3 IMPLANT
SUT NURALON 4 0 TR CR/8 (SUTURE) IMPLANT
SUT SILK 0 TIES 10X30 (SUTURE) ×3 IMPLANT
SUT SILK 3 0 SH 30 (SUTURE) IMPLANT
SUT VIC AB 2-0 CT1 18 (SUTURE) ×1 IMPLANT
SUT VIC AB 2-0 CT2 18 VCP726D (SUTURE) ×3 IMPLANT
SUT VIC AB 3-0 SH 8-18 (SUTURE) ×3 IMPLANT
SYR 30ML LL (SYRINGE) ×3 IMPLANT
SYR BULB 3OZ (MISCELLANEOUS) ×2 IMPLANT
SYR CONTROL 10ML LL (SYRINGE) IMPLANT
TOWEL OR 17X24 6PK STRL BLUE (TOWEL DISPOSABLE) ×6 IMPLANT
TOWEL OR 17X26 10 PK STRL BLUE (TOWEL DISPOSABLE) ×3 IMPLANT
TRAY LAPAROSCOPIC (CUSTOM PROCEDURE TRAY) IMPLANT
TROCAR XCEL BLUNT TIP 100MML (ENDOMECHANICALS) ×2 IMPLANT
TROCAR XCEL NON-BLD 5MMX100MML (ENDOMECHANICALS) ×3 IMPLANT
TUBE CONNECTING 12X1/4 (SUCTIONS) ×1 IMPLANT
UNDERPAD 30X30 INCONTINENT (UNDERPADS AND DIAPERS) ×1 IMPLANT
WATER STERILE IRR 1000ML POUR (IV SOLUTION) ×3 IMPLANT
snap shunt ventricular catheter, standard, barium ×3 IMPLANT

## 2016-02-04 NOTE — Progress Notes (Signed)
No acute events AVSS Awake and alert Moving all extremities well Incision c/d/i CT Head shows tetraventricular hydrocephalus and pseudomeningocele Discussed with her need for VP shunt Will ask general surgery to help with lap assist Plan VP shunt today

## 2016-02-04 NOTE — Progress Notes (Signed)
Patient is transferred to Neuro OR at this time. Consent signed.

## 2016-02-04 NOTE — Progress Notes (Signed)
[facility]  Patient Status: Active (receiving services up to time of hospitalization)  [facility] is providing the following services: RN, PT, OT and ST  If patient discharges after hours, please call [phone].   [name] [name] [name] [name] 02/04/2016, 10:34 AM

## 2016-02-04 NOTE — Anesthesia Procedure Notes (Signed)
Procedure Name: Intubation Date/Time: 02/04/2016 4:53 PM Performed by: Eligha Bridegroom Pre-anesthesia Checklist: Emergency Drugs available, Patient identified, Timeout performed, Suction available and Patient being monitored Patient Re-evaluated:Patient Re-evaluated prior to inductionOxygen Delivery Method: Circle system utilized Preoxygenation: Pre-oxygenation with 100% oxygen Intubation Type: IV induction Ventilation: Mask ventilation without difficulty Laryngoscope Size: Mac Grade View: Grade I Tube type: Oral Tube size: 7.0 mm Number of attempts: 1 Airway Equipment and Method: Stylet and LTA kit utilized Placement Confirmation: ETT inserted through vocal cords under direct vision,  breath sounds checked- equal and bilateral and positive ETCO2 Secured at: 21 cm Tube secured with: Tape Dental Injury: Teeth and Oropharynx as per pre-operative assessment

## 2016-02-04 NOTE — Transfer of Care (Signed)
Immediate Anesthesia Transfer of Care Note  Patient: [name] [name]  Procedure(s) Performed: Procedure(s): SHUNT INSERTION VENTRICULAR-PERITONEAL With Laparoscopic Assistance (Right) LAPAROSCOPIC Insertion VENTRICULAR-PERITONEAL (V-P) SHUNT (N/A)  Patient Location: PACU and SICU  Anesthesia Type:General  Level of Consciousness: awake and alert   Airway & Oxygen Therapy: Patient Spontanous Breathing and Patient connected to nasal cannula oxygen  Post-op Assessment: Report given to RN and Post -op Vital signs reviewed and stable  Post vital signs: Reviewed and stable  Last Vitals:  Filed Vitals:   02/04/16 1030 02/04/16 1356  BP: 100/59 111/63  Pulse: 84 71  Temp: 36.8 C 37 C  Resp: 18 16    Complications: No apparent anesthesia complications

## 2016-02-04 NOTE — Anesthesia Postprocedure Evaluation (Signed)
Anesthesia Post Note  Patient: [name] [name]  Procedure(s) Performed: Procedure(s) (LRB): SHUNT INSERTION VENTRICULAR-PERITONEAL With Laparoscopic Assistance (Right) LAPAROSCOPIC Insertion VENTRICULAR-PERITONEAL (V-P) SHUNT (N/A)  Patient location during evaluation: PACU Anesthesia Type: General Level of consciousness: awake and alert Pain management: pain level controlled Vital Signs Assessment: post-procedure vital signs reviewed and stable Respiratory status: spontaneous breathing, nonlabored ventilation, respiratory function stable and patient connected to nasal cannula oxygen Cardiovascular status: blood pressure returned to baseline and stable Postop Assessment: no signs of nausea or vomiting Anesthetic complications: no    Last Vitals:  Filed Vitals:   02/04/16 1930 02/04/16 2000  BP:  125/72  Pulse: 82 80  Temp: 36.5 C   Resp: 13 10    Last Pain:  Filed Vitals:   02/04/16 2005  PainSc: Asleep                 Jackeline Gutknecht J

## 2016-02-04 NOTE — Op Note (Signed)
02/03/2016 - 02/04/2016  5:59 PM  PATIENT:  [name] [name]  55 y.o. female  PRE-OPERATIVE DIAGNOSIS:  Communicating hydrocephalus  POST-OPERATIVE DIAGNOSIS:  Same  PROCEDURE:  Placement of right frontal VP shunt with laparoscopic assistance  SURGEON:  Aldean Ast, MD  CO-SURGEON: Rolm Bookbinder, MD  ANESTHESIA:   General  DRAINS: None  SPECIMEN:  None  INDICATION FOR PROCEDURE: 55 year old female with subarachnoid hemorrhage ~1 month ago.  She underwent clipping of a left PICA aneurysm.  She presented last night with headaches and lethargy.  She was found to have hydrocephalus. Patient understood the risks, benefits, and alternatives and potential outcomes and wished to proceed.  PROCEDURE DETAILS: After smooth induction of general endotracheal anesthesia the right frontal-temporo-parietal area was clipped of hair and the entire operative area was wiped down with alcohol.  The patient was then prepped and draped in the usual sterile fashion.  A semicircular incision was made over the prior burr hole from the ventriculostomy.  This was then reflected.  A stab incision was made approximately two finger breadths below the xiphoid process.  The shunt tunneler was then passed from there to the right retroauricular area and a silk suture was pulled through from the top down.  This was then repeated from the frontal incision to the retroauricular incision.  The valve-distal catheter assembly was then passed from the frontal area to the abdomen.  Please see Dr. Cristal Generous note for the details of his procedure.  The right frontal burrhole was expanded.  The dura was coagulated and opened.  A 7cm ventricular catheter was passed with brisk flow of spinal fluid.  This catheter was then connected to the rickham reservoir and tied with a silk suture.  The wounds were irrigated and vancomycin powder was placed in the wound.  The frontal incision was closed with interrupted vicryl and staples.   The retroauricular incision was closed with only staples.  Sterile occlusive dressings were applied.  The patient was allowed to awaken from anesthesia and she had no problems.  PATIENT DISPOSITION:  PACU then floor   Delay start of Pharmacological VTE agent (>24hrs) due to surgical blood loss or risk of bleeding:  Yes

## 2016-02-04 NOTE — Progress Notes (Signed)
Pt transferred to unit from ED via NT x 1. Pt alert and oriented upon arrival. No complaints of pain or discomfort. No signs or symptoms of acute distress. Pt connected to telemetry and central monitoring notified. Pt oriented to unit as well as unit procedures. Pt now resting in bed at lowest position, bed alarm on, call light in reach. Will continue to monitor. Fortino Sic, RN, BSN 02/04/2016 2:55 AM

## 2016-02-04 NOTE — Anesthesia Postprocedure Evaluation (Signed)
Anesthesia Post Note  Patient: LESLEYANN RO  Procedure(s) Performed: Procedure(s) (LRB): SHUNT INSERTION VENTRICULAR-PERITONEAL With Laparoscopic Assistance (Right) LAPAROSCOPIC Insertion VENTRICULAR-PERITONEAL (V-P) SHUNT (N/A)  Patient location during evaluation: PACU Anesthesia Type: General Level of consciousness: awake and alert, oriented and patient cooperative Pain management: pain level controlled Vital Signs Assessment: post-procedure vital signs reviewed and stable Respiratory status: spontaneous breathing, nonlabored ventilation, respiratory function stable and patient connected to nasal cannula oxygen Cardiovascular status: blood pressure returned to baseline and stable Postop Assessment: no signs of nausea or vomiting Anesthetic complications: no    Last Vitals:  Filed Vitals:   02/04/16 1910 02/04/16 1925  BP: 136/72 130/68  Pulse: 82 78  Temp:    Resp: 11 11    Last Pain:  Filed Vitals:   02/04/16 1927  PainSc: Asleep                 Arlys Scatena,E. Barrie Sigmund

## 2016-02-04 NOTE — Consult Note (Signed)
Reason for Consult:help place VP shunt Referring Physician: Ditty MD  [name] [name] is an 55 y.o. female.  HPI: Pt in need of VP shunt.  Asked to assist abdominal portion per Dr Cyndy Freeze.  Pt has had no abdominal surgery.   Past Medical History  Diagnosis Date  . SVT (supraventricular tachycardia) ([place])   . Hypertension   . WPW (Wolff-Parkinson-White syndrome)     Past Surgical History  Procedure Laterality Date  . Radiology with anesthesia N/A 01/03/2016    Procedure: RADIOLOGY WITH ANESTHESIA;  Surgeon: Medication Radiologist, MD;  Location: [facility];  Service: Radiology;  Laterality: N/A;  . Craniotomy N/A 01/04/2016    Procedure: Suboccipital Craniotomy and Cervical one Laminectomy for Clipping of Aneurysm;  Surgeon: Kevan Ny Ditty, MD;  Location: [place] [facility];  Service: Neurosurgery;  Laterality: N/A;    No family history on file.  Social History:  reports that she has been smoking.  She does not have any smokeless tobacco history on file. She reports that she does not drink alcohol or use illicit drugs.  Allergies: No Known Allergies  Medications: I have reviewed the patient's current medications.  Results for orders placed or performed during the hospital encounter of 02/03/16 (from the past 48 hour(s))  Comprehensive metabolic panel     Status: None   Collection Time: 02/03/16  7:18 PM  Result Value Ref Range   Sodium 142 135 - 145 mmol/L   Potassium 4.4 3.5 - 5.1 mmol/L   Chloride 107 101 - 111 mmol/L   CO2 23 22 - 32 mmol/L   Glucose, Bld 86 65 - 99 mg/dL   BUN 14 6 - 20 mg/dL   Creatinine, Ser 0.97 0.44 - 1.00 mg/dL   Calcium 10.1 8.9 - 10.3 mg/dL   Total Protein 6.7 6.5 - 8.1 g/dL   Albumin 4.1 3.5 - 5.0 g/dL   AST 20 15 - 41 U/L   ALT 22 14 - 54 U/L   Alkaline Phosphatase 57 38 - 126 U/L   Total Bilirubin 0.5 0.3 - 1.2 mg/dL   GFR calc non Af Amer >60 >60 mL/min   GFR calc Af Amer >60 >60 mL/min    Comment: (NOTE) The eGFR has been calculated using the  CKD EPI equation. This calculation has not been validated in all clinical situations. eGFR's persistently <60 mL/min signify possible Chronic Kidney Disease.    Anion gap 12 5 - 15  Lipase, blood     Status: None   Collection Time: 02/03/16  7:18 PM  Result Value Ref Range   Lipase 22 11 - 51 U/L  Troponin I     Status: None   Collection Time: 02/03/16  7:18 PM  Result Value Ref Range   Troponin I <0.03 <0.031 ng/mL    Comment:        NO INDICATION OF MYOCARDIAL INJURY.   CBC with Differential     Status: Abnormal   Collection Time: 02/03/16  7:18 PM  Result Value Ref Range   WBC 5.6 4.0 - 10.5 K/uL   RBC 4.16 3.87 - 5.11 MIL/uL   Hemoglobin 11.4 (L) 12.0 - 15.0 g/dL   HCT 37.0 36.0 - 46.0 %   MCV 88.9 78.0 - 100.0 fL   MCH 27.4 26.0 - 34.0 pg   MCHC 30.8 30.0 - 36.0 g/dL   RDW 14.9 11.5 - 15.5 %   Platelets 272 150 - 400 K/uL   Neutrophils Relative % 69 %  Neutro Abs 3.9 1.7 - 7.7 K/uL   Lymphocytes Relative 20 %   Lymphs Abs 1.1 0.7 - 4.0 K/uL   Monocytes Relative 9 %   Monocytes Absolute 0.5 0.1 - 1.0 K/uL   Eosinophils Relative 1 %   Eosinophils Absolute 0.0 0.0 - 0.7 K/uL   Basophils Relative 1 %   Basophils Absolute 0.0 0.0 - 0.1 K/uL  I-stat chem 8, ed     Status: None   Collection Time: 02/03/16  7:47 PM  Result Value Ref Range   Sodium 142 135 - 145 mmol/L   Potassium 4.2 3.5 - 5.1 mmol/L   Chloride 106 101 - 111 mmol/L   BUN 16 6 - 20 mg/dL   Creatinine, Ser 0.90 0.44 - 1.00 mg/dL   Glucose, Bld 86 65 - 99 mg/dL   Calcium, Ion 1.16 1.12 - 1.23 mmol/L   TCO2 24 0 - 100 mmol/L   Hemoglobin 12.9 12.0 - 15.0 g/dL   HCT 38.0 36.0 - 46.0 %  Glucose, capillary     Status: None   Collection Time: 02/04/16  6:30 AM  Result Value Ref Range   Glucose-Capillary 86 65 - 99 mg/dL    X-ray Chest Pa And Lateral  02/03/2016  CLINICAL DATA:  Altered mental status, hydrocephalus. History of hypertension. EXAM: CHEST  2 VIEW COMPARISON:  Chest x-ray January 15, 2016  FINDINGS: Cardiomediastinal silhouette is normal. The lungs are clear without pleural effusions or focal consolidations. Increased lung volumes with flattened hemidiaphragm suggest COPD. Trachea projects midline and there is no pneumothorax. Soft tissue planes and included osseous structures are non-suspicious. IMPRESSION: Probable COPD without superimposed acute cardiopulmonary process. Electronically Signed   By: Elon Alas M.D.   On: 02/03/2016 22:22   Ct Head Wo Contrast  02/03/2016  CLINICAL DATA:  55 year old female with prior complete presenting with listlessness EXAM: CT HEAD WITHOUT CONTRAST TECHNIQUE: Contiguous axial images were obtained from the base of the skull through the vertex without intravenous contrast. COMPARISON:  CT dated 01/13/2016 FINDINGS: No acute intracranial hemorrhage. There has been interval removal of the previously seen right frontal ventriculostomy shunt with interval development of mild to moderate hydrocephalus. There is diffuse mass effect caused by the ventricular dilatation with diffuse effacement of the sulci. Mild diffuse brain edema may be present. There is no midline shift. There is minimal effacement of the quadrigeminal plate cistern. An impending uncal herniation is not excluded. Clinical correlation and close monitoring recommended. Periventricular and deep white matter chronic microvascular ischemic changes as well as stable appearing old left cerebellar infarct. 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 The aneurysm clip again noted abutting the posterior medulla. There is a 3.1 x 5.0 cm fluid collection at the operative bed which is increased in size compared to the prior study when it measured approximately 2.5 x 3.2 cm IMPRESSION: No acute intracranial hemorrhage. Interval removal of the right frontal ventriculostomy with development of hydrocephalus. Mild cerebral edema in  combination with hydrocephalus cause diffuse mass effect and effacement of the sulci. No midline shift. Mild effacement of the quadrigeminal plate cistern. Close follow-up recommended. Stable suboccipital craniectomy. These results were called by telephone at the time of interpretation on 02/03/2016 at 8:04 pm to Dr. Carmin Muskrat , who verbally acknowledged these results. Electronically Signed   By: Anner Crete M.D.   On: 02/03/2016 20:09    Review of Systems  Gastrointestinal: Negative.    Blood pressure 100/59,  pulse 84, temperature 98.3 F (36.8 C), temperature source Oral, resp. rate 18, height '5\' 3"'  (1.6 m), weight 79.742 kg (175 lb 12.8 oz), SpO2 99 %. Physical Exam  HENT:  Craniotomy incision  Eyes: Pupils are equal, round, and reactive to light.  Neck: Normal range of motion. Neck supple.  Respiratory: Effort normal.  GI: Soft. Bowel sounds are normal. She exhibits no distension. There is no tenderness.  Skin: Skin is warm and dry.    Assessment/Plan: Hydrocephalus in need of VP shunt CCS available as long as not emergency situation arises Discussed risk of laparoscopy and reason for using it.  Discussed with on call MD Dr Donne Hazel  Depending on my availability.  The procedure has been discussed with the patient.  Alternative therapies have been discussed with the patient.  Operative risks include bleeding,  Infection,  Organ injury,  Nerve injury,   Bowel injury Blood vessel injury,  DVT,  Pulmonary embolism,  Death,  And possible reoperation.  Medical management risks include worsening of present situation.  The success of the procedure is 50 -90 % at treating patients symptoms.     Kaedyn Belardo A. 02/04/2016, 11:22 AM

## 2016-02-04 NOTE — Progress Notes (Signed)
Awake and alert Moving all extremities to command Good strength Doing well To floor after recovery

## 2016-02-04 NOTE — Op Note (Addendum)
Preoperative diagnosis:hydorcephalus Postoperative diagnosis: saa Procedure: laparoscopic placement of vp shunt Surgeon: Dr Serita Grammes Co-surgeon: Dr Suezanne Jacquet Ditty Anesthesia: general EBL: none Drains none Specimen none Complications: none Sponge count correct at completion Disposition to recovery stable  Indications: This is a 53 yof who needs vp shunt. Asked by neurosurgery to assist with this procedure.  Procedure: After informed consent was obtained the patient was taken to the operating room. She was given antibiotics. Sequential compression devices were on her legs. She was placed under general anesthesia without complication. Her abdomen was prepped and draped in the standard sterile surgical fashion. A surgical timeout was then performed.  I infiltrated marcaine in the left upper quadrant. The stomach was evacuated.  I then inserted a 5 mm optiview trocar without any entry injury. The abdomen was insufflated to 15 mm Hg pressure. I inserted an additional 5 mm trocar in the left lower quadrant.  I then awaited neurosurgery.  The catheter was tunneled to the epigastrium. I inserted a sheath. The catheter was then inserted by Dr Cyndy Freeze and the sheath removed.  I pulled the catheter in the abdomen and placed in the right gutter.  I then desufflated the abdomen and removed all trocars. I closed the three abdominal incisions with 4-0 monocryl and dermabond.  The case was then completed by neurosurgery.

## 2016-02-04 NOTE — Anesthesia Preprocedure Evaluation (Addendum)
Anesthesia Evaluation  Patient identified by MRN, date of birth, ID band Patient awake    Reviewed: Allergy & Precautions, H&P , NPO status , Patient's Chart, lab work & pertinent test results  Airway Mallampati: II  TM Distance: >3 FB Neck ROM: Full    Dental  (+) Poor Dentition, Missing   Pulmonary Current Smoker,    Pulmonary exam normal breath sounds clear to auscultation- rhonchi       Cardiovascular hypertension,  Rhythm:Regular Rate:Normal     Neuro/Psych negative neurological ROS     GI/Hepatic negative GI ROS, Neg liver ROS,   Endo/Other  negative endocrine ROS  Renal/GU negative Renal ROS  negative genitourinary   Musculoskeletal   Abdominal Normal abdominal exam  (+)   Peds  Hematology negative hematology ROS (+)   Anesthesia Other Findings Denies cardiac or pulmonary symptoms  Reproductive/Obstetrics negative OB ROS                            Anesthesia Physical  Anesthesia Plan  ASA: III  Anesthesia Plan: General   Post-op Pain Management:    Induction: Intravenous  Airway Management Planned: Oral ETT  Additional Equipment:   Intra-op Plan:   Post-operative Plan: Extubation in OR  Informed Consent: I have reviewed the patients History and Physical, chart, labs and discussed the procedure including the risks, benefits and alternatives for the proposed anesthesia with the patient or authorized representative who has indicated his/her understanding and acceptance.     Plan Discussed with: CRNA and Surgeon  Anesthesia Plan Comments: (GA with ETT for VP shunt placement, has been on nimodipine  20G PIV on right hand does not run, will start new one on left hand)       Anesthesia Quick Evaluation

## 2016-02-04 NOTE — Care Management Note (Signed)
Case Management Note  Patient Details  Name: [name] [name] MRN: [id number] Date of Birth: 20-Jul-1961  Subjective/Objective:                    Action/Plan: Patient was admitted with headache, communicating hydrocephalus. Plan for VP shunt placement.  Patient was recently discharged with [facility] following for HHPT/OT/RN/SLP.  Will follow for discharge needs post-operatively. Expected Discharge Date:                  Expected Discharge Plan:     In-House Referral:     Discharge planning Services     Post Acute Care Choice:    Choice offered to:     DME Arranged:    DME Agency:     HH Arranged:    HH Agency:     Status of Service:  In process, will continue to follow  Medicare Important Message Given:    Date Medicare IM Given:    Medicare IM give by:    Date Additional Medicare IM Given:    Additional Medicare Important Message give by:     If discussed at [place] of Stay Meetings, dates discussed:    Additional Comments:  Rolm Baptise, RN 02/04/2016, 11:39 AM [phone]

## 2016-02-05 ENCOUNTER — Encounter (HOSPITAL_COMMUNITY): Payer: Self-pay | Admitting: Radiology

## 2016-02-05 ENCOUNTER — Inpatient Hospital Stay (HOSPITAL_COMMUNITY): Payer: BLUE CROSS/BLUE SHIELD

## 2016-02-05 LAB — GLUCOSE, CAPILLARY: Glucose-Capillary: 85 mg/dL (ref 65–99)

## 2016-02-05 LAB — MRSA PCR SCREENING: MRSA by PCR: NEGATIVE

## 2016-02-05 NOTE — Discharge Instructions (Addendum)
LAPAROSCOPIC (Abdominal) SURGERY: POST OP INSTRUCTIONS  1. DIET: Follow a light bland diet the first 24 hours after arrival home, such as soup, liquids, crackers, etc. Be sure to include lots of fluids daily. Avoid fast food or heavy meals as your are more likely to get nauseated. Eat a low fat the next few days after surgery.  2. Take your usually prescribed home medications unless otherwise directed. 3. PAIN CONTROL:  1. Pain is best controlled by a usual combination of three different methods TOGETHER:  1. Ice/Heat 2. Over the counter pain medication 3. Prescription pain medication 2. Most patients will experience some swelling and bruising around the incisions. Ice packs or heating pads (30-60 minutes up to 6 times a day) will help. Use ice for the first few days to help decrease swelling and bruising, then switch to heat to help relax tight/sore spots and speed recovery. Some people prefer to use ice alone, heat alone, alternating between ice & heat. Experiment to what works for you. Swelling and bruising can take several weeks to resolve.  3. It is helpful to take an over-the-counter pain medication regularly for the first few weeks. Choose one of the following that works best for you:  1. Naproxen (Aleve, etc) Two 220mg  tabs twice a day 2. Ibuprofen (Advil, etc) Three 200mg  tabs four times a day (every meal & bedtime) 3. Acetaminophen (Tylenol, etc) 500-650mg  four times a day (every meal & bedtime) 4. A prescription for pain medication (such as oxycodone, hydrocodone, etc) should be given to you upon discharge. Take your pain medication as prescribed.  1. If you are having problems/concerns with the prescription medicine (does not control pain, nausea, vomiting, rash, itching, etc), please call us [phone] to see if we need to switch you to a different pain medicine that will work better for you and/or control your side effect better. 2. If you need a refill on your pain medication,  please contact your pharmacy. They will contact our office to request authorization. Prescriptions will not be filled after 5 pm or on week-ends. 4. Avoid getting constipated. Between the surgery and the pain medications, it is common to experience some constipation. Increasing fluid intake and taking a fiber supplement (such as Metamucil, Citrucel, FiberCon, MiraLax, etc) 1-2 times a day regularly will usually help prevent this problem from occurring. A mild laxative (prune juice, Milk of Magnesia, MiraLax, etc) should be taken according to package directions if there are no bowel movements after 48 hours.  5. Watch out for diarrhea. If you have many loose bowel movements, simplify your diet to bland foods & liquids for a few days. Stop any stool softeners and decrease your fiber supplement. Switching to mild anti-diarrheal medications (Kayopectate, Pepto Bismol) can help. If this worsens or does not improve, please call us. 6. Wash / shower every day. You may shower over the dressings as they are waterproof. Continue to shower over incision(s) after the dressing is off. 7. You have super-glue on your incision sites.  This usually falls off about 10-14 days after surgery.  Let these fall off on their own.  You have stitches under the skin which will dissolve in about 2 months. 8. ACTIVITIES as tolerated:  1. You may resume regular (light) daily activities beginning the next day--such as daily self-care, walking, climbing stairs--gradually increasing activities as tolerated. If you can walk 30 minutes without difficulty, it is safe to try more intense activity such as jogging, treadmill, bicycling, low-impact aerobics, swimming,  etc. 2. Save the most intensive and strenuous activity for last such as sit-ups, heavy lifting, contact sports, etc Refrain from any heavy lifting or straining until you are off narcotics for pain control.  3. DO NOT PUSH THROUGH PAIN. Let pain be your guide: If it hurts to do  something, don't do it. Pain is your body warning you to avoid that activity for another week until the pain goes down. 4. You may drive when you are no longer taking prescription pain medication, you can comfortably wear a seatbelt, and you can safely maneuver your car and apply brakes. 5. You may have sexual intercourse when it is comfortable.  9. FOLLOW UP in our office  1. As needed      10. IF YOU HAVE DISABILITY OR FAMILY LEAVE FORMS, BRING THEM TO THE OFFICE FOR PROCESSING.   WHEN TO CALL us [phone]:  1. Poor pain control 2. Reactions / problems with new medications (rash/itching, nausea, etc)  3. Fever over 101.5 F (38.5 C) 4. Inability to urinate 5. Nausea and/or vomiting 6. Worsening swelling or bruising 7. Continued bleeding from incision. 8. Increased pain, redness, or drainage from the incision  The clinic staff is available to answer your questions during regular business hours (8:30am-5pm). Please dont hesitate to call and ask to speak to one of our nurses for clinical concerns.  If you have a medical emergency, go to the nearest emergency room or call 911.  A surgeon from [facility] is always on call at the [facility], [place], [place], [place], [place] [address] ?  MAIN: [phone] ? TOLL FREE: [phone] ?  FAX [phone]  [email or web address]

## 2016-02-05 NOTE — Evaluation (Signed)
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 Patient presents with increasing headache and lethargy. Symptoms have been slowly progressive. No fever. No seizure. No new symptoms of weakness numbness or paresthesias. Head CT scan in emergency department demonstrates progressive hydrocephalus involving lateral, third and fourth ventricle consistent with progressive communicating hydrocephalus. now s/p VP shunt placement   Clinical Impression   Patient presenting with decreased ADL and functional mobility independence secondary to above. Patient independent PTA (prior to a month ago). Patient currently functioning at an overall min assist level. Patient will benefit from acute OT to increase overall independence in the areas of ADLs, functional mobility, and overall safety in order to safely discharge home with HHOT.   **Need to determine DME needs. During this eval, pt stated she sits in a tub for bathing. If this is the case, would recommend pt sponge bath at this time instead of trying to get in/out of tub.     Follow Up Recommendations  Home health OT;Supervision/Assistance - 24 hour    Equipment Recommendations  Tub/shower bench (will need to clarify pt has a tub/shower and doesn't just use a tub)    Recommendations for Other Services  None at this time    Precautions / Restrictions Precautions Precautions: Fall Restrictions Weight Bearing Restrictions: No    Mobility Bed Mobility Overal bed mobility: Needs Assistance Bed Mobility: Supine to Sit     Supine to sit: HOB elevated;Min guard     General bed mobility comments: Handheld A with bringing trunk up to sitting.    Transfers Overall transfer level: Needs assistance Equipment used: 1 person hand held  assist;Rolling walker (2 wheeled) Transfers: Sit to/from Stand Sit to Stand: Min assist General transfer comment: pt with definite use of UEs and mild balance deficits.  pt indicates feeling "a little" dizzy, but that it improved after standing a few seconds.      Balance Overall balance assessment: Needs assistance Sitting-balance support: No upper extremity supported;Feet supported Sitting balance-Leahy Scale: Good     Standing balance support: During functional activity Standing balance-Leahy Scale: Fair    ADL Overall ADL's : Needs assistance/impaired Eating/Feeding: Set up;Sitting   Grooming: Minimal assistance;Standing Grooming Details (indicate cue type and reason): at sink Upper Body Bathing: Supervision/ safety;Set up;Sitting   Lower Body Bathing: Minimal assistance;Sit to/from stand;Cueing for safety;Cueing for sequencing   Upper Body Dressing : Set up;Sitting;Supervision/safety   Lower Body Dressing: Minimal assistance;Sit to/from stand Lower Body Dressing Details (indicate cue type and reason): crossing over bilateral legs Toilet Transfer: Minimal assistance;BSC;Ambulation;Grab bars;RW   Toileting- Clothing Manipulation and Hygiene: Minimal assistance;Sit to/from stand       Functional mobility during ADLs: Minimal assistance;Cueing for safety;Rolling walker General ADL Comments: Pt ambulated into BR with min assist for toilet transfer, used toilet paper, then requested to clean up at sink. Pt ambulated to sink and used wash cloth for additional peri cleaning. Pt overall min assist and with poor overall balance in standing.     Pertinent Vitals/Pain Pain Assessment: Faces Faces Pain Scale: Hurts a little bit Pain Location: unsure, pt grimacing at times Pain Descriptors / Indicators: Grimacing Pain Intervention(s): Monitored during session     Hand Dominance Right   Extremity/Trunk Assessment Upper Extremity Assessment Upper Extremity Assessment:  Generalized weakness   Lower Extremity Assessment Lower Extremity  Assessment: Generalized weakness       Communication Communication Communication: No difficulties   Cognition Arousal/Alertness: Awake/alert Behavior During Therapy: WFL for tasks assessed/performed;Flat affect Overall Cognitive Status: No family/caregiver present to determine baseline cognitive functioning Area of Impairment: Safety/judgement Safety/Judgement: Decreased awareness of deficits;Decreased awareness of safety   Problem Solving: Slow processing                Home Living Family/patient expects to be discharged to:: Private residence Living Arrangements: Parent;Other relatives Available Help at Discharge: Family;Available 24 hours/day Type of Home: House Home Access: Stairs to enter [employer] of Steps: 2   Home Layout: One level     Bathroom Shower/Tub: Curtain;Tub only Shower/tub characteristics: Architectural technologist: Standard     Home Equipment: None   Additional Comments: Pt lives w/ mother and 37 y/o grandson.  Per pt mother is in good physical health to assist pt at d/c.       Prior Functioning/Environment Level of Independence: Independent (prior to last month's admission)     OT Diagnosis: Generalized weakness;Cognitive deficits   OT Problem List: Decreased strength;Decreased activity tolerance;Impaired balance (sitting and/or standing);Decreased cognition;Decreased safety awareness;Decreased knowledge of use of DME or AE;Decreased knowledge of precautions   OT Treatment/Interventions: Self-care/ADL training;Patient/family education;Balance training;Therapeutic activities;DME and/or AE instruction;Cognitive remediation/compensation    OT Goals(Current goals can be found in the care plan section) Acute Rehab OT Goals Patient Stated Goal: to go home OT Goal Formulation: With patient Time For Goal Achievement: 02/19/16 Potential to Achieve Goals: Good ADL  Goals Pt Will Perform Grooming: with supervision;standing Pt Will Perform Lower Body Bathing: with supervision;sit to/from stand Pt Will Perform Lower Body Dressing: with supervision;sit to/from stand Pt Will Transfer to Toilet: with supervision;ambulating;regular height toilet Additional ADL Goal #1: Pt will be supervision with functional mobility using LRAD prn  OT Frequency: Min 2X/week   Barriers to D/C: none known at this time       Co-evaluation PT/OT/SLP Co-Evaluation/Treatment: Yes Reason for Co-Treatment: For patient/therapist safety PT goals addressed during session: Mobility/safety with mobility OT goals addressed during session: ADL's and self-care;Other (comment) (safety)      End of Session Equipment Utilized During Treatment: Rolling walker  Activity Tolerance: Patient tolerated treatment well Patient left: in chair;with call bell/phone within reach;with chair alarm set   Time: [id number] OT Time Calculation (min): 16 min Charges:  OT General Charges $OT Visit: 1 Procedure OT Evaluation $OT Eval Moderate Complexity: 1 Procedure  Chrys Racer , MS, OTR/L, CLT Pager: [phone]  02/05/2016, 4:11 PM

## 2016-02-05 NOTE — Clinical Documentation Improvement (Signed)
Neuro Surgery  Based on the clinical findings below, please document any associated diagnoses/conditions the patient has or may have.   Cerebral edema  Compression of brain  Cerebral herniation  Other  Clinically Undetermined  Supporting Information: ED notes: reports weakness, n/v, headaches  CT HEAD WITHOUT CONTRAST 02/03/2016 at 8:04 pm   IMPRESSION: No acute intracranial hemorrhage.  Interval removal of the right frontal ventriculostomy with development of hydrocephalus.  Mild cerebral edema in combination with hydrocephalus cause diffuse mass effect and effacement of the sulci. No midline shift.   Please exercise your independent, professional judgment when responding. A specific answer is not anticipated or expected. Please update your documentation within the medical record to reflect your response to this query. Thank you  Thank You, [place] [phone]

## 2016-02-05 NOTE — Progress Notes (Signed)
[facility] Progress Note  1 Day Post-Op  Subjective: Doing well.  No abdominal pain.  No N/V, hasn't had breakfast yet.  Passing flatus.  BM last on 02/03/16.     Objective: Vital signs in last 24 hours: Temp:  [97.7 F (36.5 C)-99.5 F (37.5 C)] 98.7 F (37.1 C) (02/14 0855) Pulse Rate:  [64-111] 82 (02/14 0855) Resp:  [10-21] 19 (02/14 0855) BP: (97-140)/(51-82) 123/51 mmHg (02/14 0855) SpO2:  [96 %-100 %] 100 % (02/14 0855) Weight:  [81.3 kg (179 lb 3.7 oz)] 81.3 kg (179 lb 3.7 oz) (02/13 2000) Last BM Date: 02/03/16  Intake/Output from previous day: 02/13 0701 - 02/14 0700 In: 1410 [P.O.:360; I.V.:1050] Out: 460 [Urine:460] Intake/Output this shift: Total I/O In: 240 [P.O.:240] Out: -   PE: Gen:  Alert, NAD, pleasant Abd: Soft, NT/ND, +BS, no HSM, 3 incisions sites C/D/I   Lab Results:   Recent Labs  02/03/16 1918 02/03/16 1947  WBC 5.6  --   HGB 11.4* 12.9  HCT 37.0 38.0  PLT 272  --    BMET  Recent Labs  02/03/16 1918 02/03/16 1947  NA 142 142  K 4.4 4.2  CL 107 106  CO2 23  --   GLUCOSE 86 86  BUN 14 16  CREATININE 0.97 0.90  CALCIUM 10.1  --    PT/INR No results for input(s): LABPROT, INR in the last 72 hours. CMP     Component Value Date/Time   NA 142 02/03/2016 1947   K 4.2 02/03/2016 1947   CL 106 02/03/2016 1947   CO2 23 02/03/2016 1918   GLUCOSE 86 02/03/2016 1947   BUN 16 02/03/2016 1947   CREATININE 0.90 02/03/2016 1947   CALCIUM 10.1 02/03/2016 1918   PROT 6.7 02/03/2016 1918   ALBUMIN 4.1 02/03/2016 1918   AST 20 02/03/2016 1918   ALT 22 02/03/2016 1918   ALKPHOS 57 02/03/2016 1918   BILITOT 0.5 02/03/2016 1918   GFRNONAA >60 02/03/2016 1918   GFRAA >60 02/03/2016 1918   Lipase     Component Value Date/Time   LIPASE 22 02/03/2016 1918       Studies/Results: X-ray Chest Pa And Lateral  02/03/2016  CLINICAL DATA:  Altered mental status, hydrocephalus. History of hypertension. EXAM: CHEST  2 VIEW  COMPARISON:  Chest x-ray January 15, 2016 FINDINGS: Cardiomediastinal silhouette is normal. The lungs are clear without pleural effusions or focal consolidations. Increased lung volumes with flattened hemidiaphragm suggest COPD. Trachea projects midline and there is no pneumothorax. Soft tissue planes and included osseous structures are non-suspicious. IMPRESSION: Probable COPD without superimposed acute cardiopulmonary process. Electronically Signed   By: Elon Alas M.D.   On: 02/03/2016 22:22   Ct Head Wo Contrast  02/05/2016  CLINICAL DATA:  Hydrocephalus follow-up. EXAM: CT HEAD WITHOUT CONTRAST TECHNIQUE: Contiguous axial images were obtained from the base of the skull through the vertex without intravenous contrast. COMPARISON:  Two days ago FINDINGS: Skull and Sinuses:Suboccipital craniotomy. 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 Soft tissue gas around the ventriculoperitoneal shunt catheter, expected given timing. No fluid collection identified. Visualized orbits: Negative. Brain: Interval placement of ventriculoperitoneal shunt by right frontal approach with tip at the caudal thalamic groove on the left. Ventricular system has been decompressed, now with mild dilatation of the temporal horns. Stable cerebral white matter low density, greatest in the biparietal region, left more right. No interval hemorrhage or acute infarct.  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 With ventricular drainage, sulcal effacement is improved. Left PICA aneurysm clip. IMPRESSION: No acute finding after VP shunt placement. Significantly improved ventriculomegaly. Electronically Signed   By: Monte Fantasia M.D.   On: 02/05/2016 06:43   Ct Head Wo Contrast  02/03/2016  CLINICAL DATA:  55 year old female with prior complete presenting with listlessness EXAM: CT HEAD WITHOUT CONTRAST TECHNIQUE: Contiguous axial images were  obtained from the base of the skull through the vertex without intravenous contrast. COMPARISON:  CT dated 01/13/2016 FINDINGS: No acute intracranial hemorrhage. There has been interval removal of the previously seen right frontal ventriculostomy shunt with interval development of mild to moderate hydrocephalus. There is diffuse mass effect caused by the ventricular dilatation with diffuse effacement of the sulci. Mild diffuse brain edema may be present. There is no midline shift. There is minimal effacement of the quadrigeminal plate cistern. An impending uncal herniation is not excluded. Clinical correlation and close monitoring recommended. Periventricular and deep white matter chronic microvascular ischemic changes as well as stable appearing old left cerebellar infarct. 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 The aneurysm clip again noted abutting the posterior medulla. There is a 3.1 x 5.0 cm fluid collection at the operative bed which is increased in size compared to the prior study when it measured approximately 2.5 x 3.2 cm IMPRESSION: No acute intracranial hemorrhage. Interval removal of the right frontal ventriculostomy with development of hydrocephalus. Mild cerebral edema in combination with hydrocephalus cause diffuse mass effect and effacement of the sulci. No midline shift. Mild effacement of the quadrigeminal plate cistern. Close follow-up recommended. Stable suboccipital craniectomy. These results were called by telephone at the time of interpretation on 02/03/2016 at 8:04 pm to Dr. Carmin Muskrat , who verbally acknowledged these results. Electronically Signed   By: Anner Crete M.D.   On: 02/03/2016 20:09    Anti-infectives: Anti-infectives    Start     Dose/Rate Route Frequency Ordered Stop   02/04/16 1816  bacitracin 50,000 Units in sodium chloride irrigation 0.9 % 500 mL irrigation  Status:  Discontinued        As needed 02/04/16 1816 02/04/16 1818   02/04/16 1816  vancomycin (VANCOCIN) powder  Status:  Discontinued       As needed 02/04/16 1818 02/04/16 1818   02/04/16 1722  vancomycin (VANCOCIN) 1000 MG powder    Comments:  Loreli Dollar   : cabinet override      02/04/16 1722 02/05/16 0529       Assessment/Plan Communicating hydrocephalus POD #1 s/p Laparoscopic placement of VP shunt Dr. Donne Hazel -Diet advanced as tolerated, watch for ileus -Mobilize as able, IS -May need bowel regimen - colase +/- miralax -F/u with Dr. Donne Hazel as needed, no heavy lifting >10lbs for 3 weeks, dissolvable sutures, dermabond will flake off in about 2 weeks.    LOS: 2 days    Nat Christen 02/05/2016, 9:34 AM Pager: [phone]

## 2016-02-05 NOTE — Progress Notes (Signed)
No acute events Headaches better AVSS Moves all extremities well Incisions look good CT shows shunt terminates in the contralateral lateral ventricle Doing well To floor PT/OT

## 2016-02-05 NOTE — Evaluation (Signed)
Physical Therapy Evaluation Patient Details Name: [name] [name] MRN: [id number] DOB: [birth year] Today's Date: 02/05/2016   History of Present Illness  HPI: 55 year old woman 1 month status post suboccipital craniectomy and clipping of PICA segment aneurysm. Patient presents with increasing headache and lethargy. Symptoms have been slowly progressive. No fever. No seizure. No new symptoms of weakness numbness or paresthesias. Head CT scan in emergency department demonstrates progressive hydrocephalus involving lateral, third and fourth ventricle consistent with progressive communicating hydrocephalus. now s/p VP shunt placement  Clinical Impression   Patient is s/p above surgery resulting in functional limitations due to the deficits listed below (see PT Problem List).  Patient will benefit from skilled PT to increase their independence and safety with mobility to allow discharge to the venue listed below.       Follow Up Recommendations Home health PT;Supervision/Assistance - 24 hour    Equipment Recommendations  Rolling walker with 5" wheels (may already have)    Recommendations for Other Services       Precautions / Restrictions Precautions Precautions: Fall      Mobility  Bed Mobility Overal bed mobility: Needs Assistance Bed Mobility: Supine to Sit     Supine to sit: HOB elevated;Min guard     General bed mobility comments: Handheld A with bringing trunk up to sitting.    Transfers Overall transfer level: Needs assistance Equipment used: 1 person hand held assist Transfers: Sit to/from Stand Sit to Stand: Min assist         General transfer comment: pt with definite use of UEs and mild balance deficits.  pt indicates feeling "a little" dizzy, but that it improved after standing a few seconds.    Ambulation/Gait Ambulation/Gait assistance: Min assist Ambulation Distance (Feet): 20 Feet (to/from bathroom) Assistive device: None;Rolling walker (2 wheeled) Gait  Pattern/deviations: Step-through pattern Gait velocity: quite slow   General Gait Details: initiated walking without RW, noted quite unsteady, and this therapist suggested using RW, which pt was open to using  Stairs            Wheelchair Mobility    Modified Rankin (Stroke Patients Only)       Balance                                             Pertinent Vitals/Pain Pain Assessment: No/denies pain Faces Pain Scale: Hurts a little bit Pain Location: did not specify; perhaps surgical site pain versus grimace with effort Pain Descriptors / Indicators: Grimacing Pain Intervention(s): Monitored during session    Home Living Family/patient expects to be discharged to:: Private residence Living Arrangements: Parent;Other relatives Available Help at Discharge: Family;Available 24 hours/day Type of Home: House Home Access: Stairs to enter   [employer] of Steps: 2 Home Layout: One level   Additional Comments: Pt lives w/ mother and 86 y/o grandson.  Per pt mother is in good physical health to assist pt at d/c    Prior Function Level of Independence: Independent (prior to last month's admission)               Hand Dominance   Dominant Hand: Right    Extremity/Trunk Assessment   Upper Extremity Assessment: Defer to OT evaluation           Lower Extremity Assessment: Generalized weakness         Communication   Communication: No  difficulties  Cognition Arousal/Alertness: Awake/alert Behavior During Therapy: WFL for tasks assessed/performed;Flat affect Overall Cognitive Status: No family/caregiver present to determine baseline cognitive functioning Area of Impairment: Safety/judgement         Safety/Judgement: Decreased awareness of deficits;Decreased awareness of safety   Problem Solving: Slow processing      General Comments      Exercises        Assessment/Plan    PT Assessment Patient needs continued  PT services  PT Diagnosis Difficulty walking;Generalized weakness   PT Problem List Decreased strength;Decreased activity tolerance;Decreased balance;Decreased mobility;Decreased cognition;Decreased knowledge of use of DME;Decreased safety awareness;Decreased knowledge of precautions;Pain  PT Treatment Interventions DME instruction;Gait training;Functional mobility training;Therapeutic activities;Therapeutic exercise;Stair training;Balance training;Neuromuscular re-education;Cognitive remediation;Patient/family education   PT Goals (Current goals can be found in the Care Plan section) Acute Rehab PT Goals Patient Stated Goal: to go home PT Goal Formulation: With patient Time For Goal Achievement: 02/19/16 Potential to Achieve Goals: Good    Frequency Min 3X/week   Barriers to discharge        Co-evaluation PT/OT/SLP Co-Evaluation/Treatment: Yes Reason for Co-Treatment: For patient/therapist safety PT goals addressed during session: Mobility/safety with mobility         End of Session   Activity Tolerance: Patient tolerated treatment well Patient left: Other (comment) (with OT in room) Nurse Communication: Mobility status         Time: [id number] PT Time Calculation (min) (ACUTE ONLY): 12 min   Charges:   PT Evaluation $PT Eval Moderate Complexity: 1 Procedure     PT G Codes:        Quin Hoop 02/05/2016, 3:33 PM  Roney Marion, [place] Pager [phone] Office [phone]

## 2016-02-05 NOTE — Progress Notes (Signed)
   02/05/16 1006  Clinical Encounter Type  Visited With Patient;Health care provider  Visit Type Initial;Social support  Referral From Nurse;Patient   Chaplain responded to a patient's request for someone to visit and pray with her. Upon meeting the patient, patient seemed to just want someone to talk with. Chaplain and patient conversed for a bit before being paged away. Chaplain support available as needed.   Jeri Lager, Chaplain 02/05/2016 10:08 AM

## 2016-02-06 LAB — GLUCOSE, CAPILLARY
Glucose-Capillary: 101 mg/dL — ABNORMAL HIGH (ref 65–99)
Glucose-Capillary: 90 mg/dL (ref 65–99)

## 2016-02-06 MED ORDER — HYDROCODONE-ACETAMINOPHEN 5-325 MG PO TABS: 1.0000 | ORAL_TABLET | ORAL | Status: DC | PRN

## 2016-02-06 NOTE — Progress Notes (Signed)
Discharge instructions and follow up appointments reviewed with the patient.  Discharge medications and prescriptions reviewed with the patient. Patient voices understanding to teaching. To door via wheelchair.  Home via [place] with her husband driving.

## 2016-02-06 NOTE — Progress Notes (Signed)
Occupational Therapy Treatment Patient Details Name: [name] [name] MRN: [id number] DOB: Jun 09, 1961 Today's Date: 02/06/2016    History of present illness HPI: 55 year old woman 1 month status post suboccipital craniectomy and clipping of PICA segment aneurysm. Patient presents with increasing headache and lethargy. Symptoms have been slowly progressive. No fever. No seizure. No new symptoms of weakness numbness or paresthesias. Head CT scan in emergency department demonstrates progressive hydrocephalus involving lateral, third and fourth ventricle consistent with progressive communicating hydrocephalus. now s/p VP shunt placement   OT comments  Pt progressing well, but not back to baseline. Pt safe to D/C home with 24/7 S and follow up with [place].   Follow Up Recommendations  Home health OT;Supervision/Assistance - 24 hour    Equipment Recommendations  Tub/shower bench (pt plans to have home health assess DME)    Recommendations for Other Services      Precautions / Restrictions Precautions Precautions: Fall       Mobility Bed Mobility Overal bed mobility: Modified Independent                Transfers Overall transfer level: Needs assistance Equipment used: Rolling walker (2 wheeled) Transfers: Sit to/from Stand Sit to Stand: Supervision         General transfer comment: VC for correct positionoing of hands with RW    Balance     Sitting balance-Leahy Scale: Good       Standing balance-Leahy Scale: Fair                     ADL       Grooming: [employer] fast streamer: Supervision/safety;RW             General ADL Comments: Completed toilet transfer and peri care with S @ RW level. VC for safe use of RW. discussed home set up with tub and recommend pt not attempt tub transfer at this time due to fall risk .Discussed need for pt to work on this with her home health therapist and decide what DME would be best  for her to use.       Vision                     Perception     Praxis      Cognition   Behavior During Therapy: [facility] for tasks assessed/performed;Flat affect Overall Cognitive Status: No family/caregiver present to determine baseline cognitive functioning                Problem Solving: Slow processing General Comments: Pt demonstrates deficits with safety. ? memory deficits    Extremity/Trunk Assessment               Exercises     Shoulder Instructions       General Comments      Pertinent Vitals/ Pain       Pain Assessment: Faces Faces Pain Scale: Hurts a little bit Pain Location: head Pain Descriptors / Indicators: Aching Pain Intervention(s): Limited activity within patient's tolerance;Patient requesting pain meds-RN notified  Home Living                                          Prior Functioning/Environment  Frequency Min 2X/week     Progress Toward Goals  OT Goals(current goals can now be found in the care plan section)  Progress towards OT goals: Progressing toward goals  Acute Rehab OT Goals Patient Stated Goal: to go home OT Goal Formulation: With patient Time For Goal Achievement: 02/19/16 Potential to Achieve Goals: Good ADL Goals Pt Will Perform Grooming: with supervision;standing Pt Will Perform Upper Body Bathing: with supervision;sitting;standing Pt Will Perform Lower Body Bathing: with supervision;sit to/from stand Pt Will Perform Upper Body Dressing: with supervision;standing;sitting Pt Will Perform Lower Body Dressing: with supervision;sit to/from stand Pt Will Transfer to Toilet: with supervision;ambulating;regular height toilet Pt Will Perform Toileting - Clothing Manipulation and hygiene: with supervision;sit to/from stand Additional ADL Goal #1: Pt will be supervision with functional mobility using LRAD prn  Plan Discharge plan remains appropriate    Co-evaluation                  End of Session Equipment Utilized During Treatment: Rolling walker   Activity Tolerance Patient tolerated treatment well   Patient Left in chair;with call bell/phone within reach;with chair alarm set   Nurse Communication Mobility status;Patient requests pain meds        Time: [id number] OT Time Calculation (min): 19 min  Charges: OT General Charges $OT Visit: 1 Procedure OT Treatments $Self Care/Home Management : 8-22 mins  Ki Luckman,HILLARY 02/06/2016, 4:07 PM   [facility], OTR/L  [phone] 02/06/2016

## 2016-02-06 NOTE — Discharge Summary (Signed)
Date of Admission: 02/03/16  Date of Discharge: 02/06/16  Admission Diagnosis: Communicating hydrocephalus  Discharge Diagnosis: Same  Procedure Performed: Right frontal VP shunt insertion with laparoscopic assistance  Attending: [place] Hospital Course:  The patient was admitted with the above diagnosis.  On Hospital Day 2 she underwent the above operation.  She tolerated this well.  She had an uneventful post-operative course and is discharged in stable condition.  Discharged Medications: Resume prior meds  Follow up: With me in 2 weeks

## 2016-02-06 NOTE — ED Provider Notes (Signed)
CSN: [id number]     Arrival date & time 02/03/16  1712 History   First MD Initiated Contact with Patient 02/03/16 1713     Chief Complaint  Patient presents with  . Weakness  . Emesis     (Consider location/radiation/quality/duration/timing/severity/associated sxs/prior Treatment) HPI Patient presents from home via EMS. Much of the history is provided by the patient's family members who noticed that over the past 3 days patient has had substantially increased weakness, nausea, vomiting. She also complains of headache since craniotomy, 3 weeks ago. Initially, the patient, after discharge, was reported to be well, with good appetite, good interactivity. However, since the onset of this illness, she has been unwell. The patient herself awakens easily,that she feels very unwell, with headache, nausea, generalized weakness.   Past Medical History  Diagnosis Date  . SVT (supraventricular tachycardia) ([place])   . Hypertension   . WPW (Wolff-Parkinson-White syndrome)    Past Surgical History  Procedure Laterality Date  . Radiology with anesthesia N/A 01/03/2016    Procedure: RADIOLOGY WITH ANESTHESIA;  Surgeon: Medication Radiologist, MD;  Location: [facility];  Service: Radiology;  Laterality: N/A;  . Craniotomy N/A 01/04/2016    Procedure: Suboccipital Craniotomy and Cervical one Laminectomy for Clipping of Aneurysm;  Surgeon: Kevan Ny Ditty, MD;  Location: [facility];  Service: Neurosurgery;  Laterality: N/A;  . Ventriculoperitoneal shunt Right 02/04/2016    Procedure: SHUNT INSERTION VENTRICULAR-PERITONEAL With Laparoscopic Assistance;  Surgeon: Kevan Ny Ditty, MD;  Location: [facility];  Service: Neurosurgery;  Laterality: Right;  . Laparoscopic revision ventricular-peritoneal (v-p) shunt N/A 02/04/2016    Procedure: LAPAROSCOPIC Insertion VENTRICULAR-PERITONEAL (V-P) SHUNT;  Surgeon: Rolm Bookbinder, MD;  Location: [facility];  Service: General;  Laterality: N/A;   No  family history on file. Social History  Substance Use Topics  . Smoking status: Current Every Day Smoker -- 1.00 packs/day  . Smokeless tobacco: None  . Alcohol Use: No   OB History    No data available     Review of Systems  Unable to perform ROS: Acuity of condition      Allergies  Review of patient's allergies indicates no known allergies.  Home Medications   Prior to Admission medications   Medication Sig Start Date End Date Taking? Authorizing Provider  lovastatin (MEVACOR) 20 MG tablet Take 20 mg by mouth at bedtime.   Yes Historical Provider, MD  niMODipine (NIMOTOP) 30 MG capsule Take 2 capsules (60 mg total) by mouth every 4 (four) hours. 01/17/16  Yes Kevan Ny Ditty, MD  diphenhydrAMINE (BENADRYL) 25 MG tablet Take 1 tablet (25 mg total) by mouth every 4 (four) hours as needed for itching. 09/04/15   Tammy Triplett, PA-C  HYDROcodone-acetaminophen (NORCO/VICODIN) 5-325 MG tablet Take 1 tablet by mouth every 6 (six) hours as needed. 02/01/16   Historical Provider, MD  HYDROcodone-acetaminophen (NORCO/VICODIN) 5-325 MG tablet Take 1-2 tablets by mouth every 4 (four) hours as needed for moderate pain. 02/06/16   Kevan Ny Ditty, MD  methylPREDNISolone (MEDROL) 4 MG tablet Dispense one medrol dose pak, take as directed 01/17/16   Kevan Ny Ditty, MD  oxyCODONE-acetaminophen (PERCOCET/ROXICET) 5-325 MG tablet Take 1-2 tablets by mouth every 6 (six) hours as needed for moderate pain. 01/17/16   Kevan Ny Ditty, MD  predniSONE (DELTASONE) 20 MG tablet Two tabs po qd x 5 days 09/04/15   Tammy Triplett, PA-C   BP 117/74 mmHg  Pulse 85  Temp(Src) 98.6 F (37 C) (Oral)  Resp 20  Ht 5\' 3"  (1.6 m)  Wt 179 lb 3.7 oz (81.3 kg)  BMI 31.76 kg/m2  SpO2 99% Physical Exam  Constitutional: She appears ill.  HENT:  Head: Normocephalic and atraumatic.  Prior surgical scar visible, no active bleeding, drainage, discharge  Eyes: Conjunctivae and EOM are normal.   Cardiovascular: Normal rate and regular rhythm.   Pulmonary/Chest: Effort normal and breath sounds normal. No stridor. No respiratory distress.  Abdominal: She exhibits no distension.  Musculoskeletal: She exhibits no edema.  Neurological: She is alert. No cranial nerve deficit.  Patient moves all extremity spontaneously, has slow, but clear speech.   Skin: Skin is warm and dry.  Psychiatric: She has a normal mood and affect. Cognition and memory are impaired.  Nursing note and vitals reviewed.   ED Course  Procedures (including critical care time) Labs Review Labs Reviewed  CBC WITH DIFFERENTIAL/PLATELET - Abnormal; Notable for the following:    Hemoglobin 11.4 (*)    All other components within normal limits  GLUCOSE, CAPILLARY - Abnormal; Notable for the following:    Glucose-Capillary 101 (*)    All other components within normal limits  MRSA PCR SCREENING  COMPREHENSIVE METABOLIC PANEL  LIPASE, BLOOD  TROPONIN I  GLUCOSE, CAPILLARY  GLUCOSE, CAPILLARY  GLUCOSE, CAPILLARY  I-STAT CG4 LACTIC ACID, ED  I-STAT CHEM 8, ED    Imaging Reviewed  I have personally reviewed and evaluated these images and lab results as part of my medical decision-making.   EKG Interpretation   Date/Time:  Sunday February 03 2016 19:00:25 EST Ventricular Rate:  77 PR Interval:  151 QRS Duration: 104 QT Interval:  393 QTC Calculation: 445 R Axis:   74 Text Interpretation:  Sinus rhythm Inferior infarct, old Probable  anterolateral infarct, old Sinus rhythm T wave abnormality Abnormal ekg  Confirmed by Carmin Muskrat  MD [phone]) on 02/03/2016 8:12:13 PM     After the initial evaluation, with concern for altered mental status, the patient had an emergent CT scan.  Update: Notable for hydrocephalus, brain edema. I discussed this with our radiologist, and subsequently with our neurosurgical team.  Update:, Patient remains in similar condition, hemodynamically similar. Patient will be  taken for emergent decompression  MDM   Final diagnoses:  Hydrocephalus   She presents 3 weeks after subarachnoid hemorrhage, with ventriculostomy, now status post removal of that device, but with new nausea, listlessness, headache. Here the patient is found to have recurrent hydrocephalus, with substantial brain edema. After these findings were discovered, I discussed her case with our neurosurgical colleagues, and she was taken for emergent placement of decompression device.   CRITICAL CARE Performed by: Carmin Muskrat Total critical care time: 40 minutes Critical care time was exclusive of separately billable procedures and treating other patients. Critical care was necessary to treat or prevent imminent or life-threatening deterioration. Critical care was time spent personally by me on the following activities: development of treatment plan with patient and/or surrogate as well as nursing, discussions with consultants, evaluation of patient's response to treatment, examination of patient, obtaining history from patient or surrogate, ordering and performing treatments and interventions, ordering and review of laboratory studies, ordering and review of radiographic studies, pulse oximetry and re-evaluation of patient's condition.   Carmin Muskrat, MD 02/06/16 1600

## 2016-02-06 NOTE — Progress Notes (Signed)
No acute events Eager to go home AVSS Doing well Incisions look good D/c today

## 2016-02-06 NOTE — Care Management Note (Addendum)
Case Management Note  Patient Details  Name: [name] [name] MRN: [id number] Date of Birth: November 13, 1961  Subjective/Objective:                    Action/Plan: Patient discharging home with home health services. CM met with the pt and provided her a list of [facility] agencies in the [place] area. She was already active with [facility] prior to admission and would like to continue with them.  [facility] notified and accepted the referral. Jermaine with [facility] notified of the order for the rolling walker. He will deliver the equipment to the room. Will update the bedside RN.   Expected Discharge Date:                  Expected Discharge Plan:  [place]  In-House Referral:     Discharge planning Services  CM Consult  Post Acute Care Choice:  Durable Medical Equipment, Home Health Choice offered to:  Patient  DME Arranged:  Walker rolling DME Agency:  [facility] Arranged:  PT, OT [facility] Agency:  [facility]  Status of Service:  Completed, signed off  Medicare Important Message Given:    Date Medicare IM Given:    Medicare IM give by:    Date Additional Medicare IM Given:    Additional Medicare Important Message give by:     If discussed at [place] of Stay Meetings, dates discussed:    Additional Comments:  Pollie Friar, RN 02/06/2016, 2:09 PM

## 2016-02-19 ENCOUNTER — Other Ambulatory Visit: Payer: Self-pay | Admitting: Neurological Surgery

## 2016-02-19 DIAGNOSIS — G919 Hydrocephalus, unspecified: Secondary | ICD-10-CM

## 2016-05-29 ENCOUNTER — Other Ambulatory Visit: Payer: Self-pay | Admitting: Neurological Surgery

## 2016-05-29 DIAGNOSIS — I609 Nontraumatic subarachnoid hemorrhage, unspecified: Secondary | ICD-10-CM

## 2016-06-04 ENCOUNTER — Ambulatory Visit
Admission: RE | Admit: 2016-06-04 | Discharge: 2016-06-04 | Disposition: A | Discharge status: Home / Self Care | Payer: BLUE CROSS/BLUE SHIELD | Source: Ambulatory Visit | Attending: Neurological Surgery | Admitting: Neurological Surgery

## 2016-06-04 DIAGNOSIS — I609 Nontraumatic subarachnoid hemorrhage, unspecified: Secondary | ICD-10-CM

## 2016-06-04 MED ORDER — IOPAMIDOL (ISOVUE-370) INJECTION 76%
80.0000 mL | Freq: Once | INTRAVENOUS | Status: AC | PRN
  Administered 2016-06-04: 80 mL via INTRAVENOUS

## 2016-06-16 ENCOUNTER — Other Ambulatory Visit: Payer: Self-pay | Admitting: Neurological Surgery

## 2016-07-02 ENCOUNTER — Encounter (HOSPITAL_COMMUNITY)
Admission: RE | Admit: 2016-07-02 | Discharge: 2016-07-02 | Disposition: A | Discharge status: Home / Self Care | Payer: BLUE CROSS/BLUE SHIELD | Source: Ambulatory Visit | Attending: Neurological Surgery | Admitting: Neurological Surgery

## 2016-07-02 ENCOUNTER — Encounter (HOSPITAL_COMMUNITY): Payer: Self-pay

## 2016-07-02 DIAGNOSIS — Z01812 Encounter for preprocedural laboratory examination: Secondary | ICD-10-CM | POA: Diagnosis not present

## 2016-07-02 HISTORY — DX: Disorder of thyroid, unspecified: E07.9

## 2016-07-02 HISTORY — DX: Headache: R51

## 2016-07-02 HISTORY — DX: Headache, unspecified: R51.9

## 2016-07-02 LAB — CBC
HCT: 40.8 % (ref 36.0–46.0)
Hemoglobin: 13.2 g/dL (ref 12.0–15.0)
MCH: 26.8 pg (ref 26.0–34.0)
MCHC: 32.4 g/dL (ref 30.0–36.0)
MCV: 82.9 fL (ref 78.0–100.0)
Platelets: 252 10*3/uL (ref 150–400)
RBC: 4.92 MIL/uL (ref 3.87–5.11)
RDW: 14.9 % (ref 11.5–15.5)
WBC: 5.8 10*3/uL (ref 4.0–10.5)

## 2016-07-02 LAB — BASIC METABOLIC PANEL
Anion gap: 6 (ref 5–15)
BUN: 11 mg/dL (ref 6–20)
CO2: 23 mmol/L (ref 22–32)
Calcium: 9.1 mg/dL (ref 8.9–10.3)
Chloride: 111 mmol/L (ref 101–111)
Creatinine, Ser: 1.12 mg/dL — ABNORMAL HIGH (ref 0.44–1.00)
GFR calc Af Amer: >60 mL/min (ref >60)
GFR calc non Af Amer: 54 mL/min — ABNORMAL LOW (ref >60)
Glucose, Bld: 105 mg/dL — ABNORMAL HIGH (ref 65–99)
Potassium: 3.9 mmol/L (ref 3.5–5.1)
Sodium: 140 mmol/L (ref 135–145)

## 2016-07-02 LAB — SURGICAL PCR SCREEN
MRSA, PCR: NEGATIVE
Staphylococcus aureus: NEGATIVE

## 2016-07-02 MED ORDER — CHLORHEXIDINE GLUCONATE CLOTH 2 % EX PADS: 6.0000 | MEDICATED_PAD | Freq: Once | CUTANEOUS | Status: DC

## 2016-07-02 NOTE — Pre-Procedure Instructions (Signed)
[name] [name]  07/02/2016      [facility] - [place], [place] [address] [name] [name] [address] Phone: [phone] Fax: [phone]  [id number] - [place], [address] [place] [address] Phone: [phone] Fax: [phone]    Your procedure is scheduled on   Wednesday  07/09/16  Report to [facility] Admitting at 630  A.M.  Call this number if you have problems the morning of surgery:  [phone]   Remember:  Do not eat food or drink liquids after midnight.  Take these medicines the morning of surgery with A SIP OF WATER   NONE   Do not wear jewelry, make-up or nail polish.  Do not wear lotions, powders, or perfumes.  You may wear deoderant.  Do not shave 48 hours prior to surgery.  Men may shave face and neck.  Do not bring valuables to the hospital.  [facility] is not responsible for any belongings or valuables.  Contacts, dentures or bridgework may not be worn into surgery.  Leave your suitcase in the car.  After surgery it may be brought to your room.  For patients admitted to the hospital, discharge time will be determined by your treatment team.  Patients discharged the day of surgery will not be allowed to drive home.   Name and phone number of your driver:    Special instructions:  [name] [name] - Preparing for Surgery  Before surgery, you can play an important role.  Because skin is not sterile, your skin needs to be as free of germs as possible.  You can reduce the number of germs on you skin by washing with CHG (chlorahexidine gluconate) soap before surgery.  CHG is an antiseptic cleaner which kills germs and bonds with the skin to continue killing germs even after washing.  Please DO NOT use if you have an allergy to CHG or antibacterial soaps.  If your skin becomes reddened/irritated stop using the CHG and  inform your nurse when you arrive at Short Stay.  Do not shave (including legs and underarms) for at least 48 hours prior to the first CHG shower.  You may shave your face.  Please follow these instructions carefully:   1.  Shower with CHG Soap the night before surgery and the                                morning of Surgery.  2.  If you choose to wash your hair, wash your hair first as usual with your       normal shampoo.  3.  After you shampoo, rinse your hair and body thoroughly to remove the                      Shampoo.  4.  Use CHG as you would any other liquid soap.  You can apply chg directly       to the skin and wash gently with scrungie or a clean washcloth.  5.  Apply the CHG Soap to your body ONLY FROM THE NECK DOWN.        Do not use on open wounds or open sores.  Avoid contact with your eyes,       ears, mouth  and genitals (private parts).  Wash genitals (private parts)       with your normal soap.  6.  Wash thoroughly, paying special attention to the area where your surgery        will be performed.  7.  Thoroughly rinse your body with warm water from the neck down.  8.  DO NOT shower/wash with your normal soap after using and rinsing off       the CHG Soap.  9.  Pat yourself dry with a clean towel.            10.  Wear clean pajamas.            11.  Place clean sheets on your bed the night of your first shower and do not        sleep with pets.  Day of Surgery  Do not apply any lotions/deoderants the morning of surgery.  Please wear clean clothes to the hospital/surgery center.    Please read over the following fact sheets that you were given. Pain Booklet, Coughing and Deep Breathing, MRSA Information and Surgical Site Infection Prevention

## 2016-07-03 NOTE — Progress Notes (Signed)
Anesthesia Chart Review:  Pt is a 55 year old female scheduled for craniotomy for clipping of L middle cerebral artery aneurysm on 07/09/2016 with Marland Kitchen Ditty.    PMH includes:  HTN, WPW, SVT. Current smoker. BMI 37. S/p VP shunt insertion 02/04/16. S/p craniotomy, C1 laminectomy for clipping of aneurysm 01/04/16.   Medications include: lovastatin  Preoperative labs reviewed.    1 view CXR 01/15/16: Mild diffuse increased density in the left hemothorax. This could artifactual in nature or due to a small posteriorly layering left pleural effusion.  EKG 02/03/16: Sinus rhythm. Inferior infarct, old. Probable anterolateral infarct, old  Echo 01/14/16:  - Left ventricle: The cavity size was normal. Systolic function was vigorous. The estimated ejection fraction was in the range of 65% to 70%. Wall motion was normal; there were no regional wall motion abnormalities. Left ventricular diastolic function parameters were normal. - Aortic valve: Transvalvular velocity was within the normal range. There was no stenosis. There was no regurgitation. - Mitral valve: Transvalvular velocity was within the normal range. There was no evidence for stenosis. There was no regurgitation. - Left atrium: The atrium was moderately dilated. - Right ventricle: The cavity size was normal. Wall thickness was normal. Systolic function was normal. - Atrial septum: No defect or patent foramen ovale was identified by color flow Doppler. - Tricuspid valve: There was trivial regurgitation. - Inferior vena cava: The vessel was normal in size. The respirophasic diameter changes were in the normal range (>= 50%), consistent with normal central venous pressure.  Reviewed case with Dr. Lissa Hoard.   If no changes, I anticipate pt can proceed with surgery as scheduled.   Willeen Cass, FNP-BC [facility]/Anesthesiology Phone: [phone] 07/03/2016 1:40 PM

## 2016-07-09 ENCOUNTER — Inpatient Hospital Stay (HOSPITAL_COMMUNITY)
Admission: RE | Admit: 2016-07-09 | Discharge: 2016-07-11 | DRG: 027 | Disposition: A | Discharge status: Home Health | Payer: BLUE CROSS/BLUE SHIELD | Source: Ambulatory Visit | Attending: Neurological Surgery | Admitting: Neurological Surgery

## 2016-07-09 ENCOUNTER — Inpatient Hospital Stay (HOSPITAL_COMMUNITY): Payer: BLUE CROSS/BLUE SHIELD | Admitting: Anesthesiology

## 2016-07-09 ENCOUNTER — Inpatient Hospital Stay (HOSPITAL_COMMUNITY): Payer: BLUE CROSS/BLUE SHIELD

## 2016-07-09 ENCOUNTER — Encounter (HOSPITAL_COMMUNITY): Payer: Self-pay | Admitting: Surgery

## 2016-07-09 ENCOUNTER — Encounter (HOSPITAL_COMMUNITY)
Admission: RE | Disposition: A | Discharge status: Home Health | Payer: Self-pay | Source: Ambulatory Visit | Attending: Neurological Surgery

## 2016-07-09 ENCOUNTER — Inpatient Hospital Stay (HOSPITAL_COMMUNITY): Payer: BLUE CROSS/BLUE SHIELD | Admitting: Emergency Medicine

## 2016-07-09 DIAGNOSIS — I456 Pre-excitation syndrome: Secondary | ICD-10-CM | POA: Diagnosis present

## 2016-07-09 DIAGNOSIS — F17200 Nicotine dependence, unspecified, uncomplicated: Secondary | ICD-10-CM | POA: Diagnosis present

## 2016-07-09 DIAGNOSIS — I671 Cerebral aneurysm, nonruptured: Principal | ICD-10-CM | POA: Diagnosis present

## 2016-07-09 DIAGNOSIS — I10 Essential (primary) hypertension: Secondary | ICD-10-CM | POA: Diagnosis present

## 2016-07-09 DIAGNOSIS — Z8673 Personal history of transient ischemic attack (TIA), and cerebral infarction without residual deficits: Secondary | ICD-10-CM

## 2016-07-09 DIAGNOSIS — Z452 Encounter for adjustment and management of vascular access device: Secondary | ICD-10-CM

## 2016-07-09 HISTORY — PX: CRANIOTOMY: SHX93

## 2016-07-09 LAB — POCT I-STAT 7, (LYTES, BLD GAS, ICA,H+H)
Acid-base deficit: 2 mmol/L (ref 0.0–2.0)
Acid-base deficit: 2 mmol/L (ref 0.0–2.0)
Bicarbonate: 23.4 mEq/L (ref 20.0–24.0)
Bicarbonate: 22.7 mEq/L (ref 20.0–24.0)
Calcium, Ion: 1.15 mmol/L (ref 1.13–1.30)
Calcium, Ion: 1.13 mmol/L (ref 1.13–1.30)
HCT: 34 % — ABNORMAL LOW (ref 36.0–46.0)
HCT: 34 % — ABNORMAL LOW (ref 36.0–46.0)
Hemoglobin: 11.6 g/dL — ABNORMAL LOW (ref 12.0–15.0)
Hemoglobin: 11.6 g/dL — ABNORMAL LOW (ref 12.0–15.0)
O2 Saturation: 100 %
O2 Saturation: 100 %
Patient temperature: 34.5
Patient temperature: 34.8
Potassium: 4.4 mmol/L (ref 3.5–5.1)
Potassium: 5.1 mmol/L (ref 3.5–5.1)
Sodium: 132 mmol/L — ABNORMAL LOW (ref 135–145)
Sodium: 138 mmol/L (ref 135–145)
TCO2: 25 mmol/L (ref 0–100)
TCO2: 24 mmol/L (ref 0–100)
pCO2 arterial: 37.1 mmHg (ref 35.0–45.0)
pCO2 arterial: 35.7 mmHg (ref 35.0–45.0)
pH, Arterial: 7.397 (ref 7.350–7.450)
pH, Arterial: 7.402 (ref 7.350–7.450)
pO2, Arterial: 217 mmHg — ABNORMAL HIGH (ref 80.0–100.0)
pO2, Arterial: 264 mmHg — ABNORMAL HIGH (ref 80.0–100.0)

## 2016-07-09 LAB — PREPARE RBC (CROSSMATCH)

## 2016-07-09 SURGERY — CRANIOTOMY, FOR VERTEBRAL/BASILAR ARTERY ANEURYSM REPAIR
Anesthesia: General | Site: Head

## 2016-07-09 MED ORDER — DOCUSATE SODIUM 100 MG PO CAPS
100.0000 mg | ORAL_CAPSULE | Freq: Two times a day (BID) | ORAL | Status: DC
  Administered 2016-07-09 – 2016-07-11 (×4): 100 mg via ORAL
  Filled 2016-07-09 (×4): qty 1

## 2016-07-09 MED ORDER — PHENYLEPHRINE HCL 10 MG/ML IJ SOLN
INTRAMUSCULAR | Status: DC | PRN
  Administered 2016-07-09: 80 ug via INTRAVENOUS

## 2016-07-09 MED ORDER — GLYCOPYRROLATE 0.2 MG/ML IJ SOLN
INTRAMUSCULAR | Status: DC | PRN
  Administered 2016-07-09: 0.1 mg via INTRAVENOUS

## 2016-07-09 MED ORDER — LIDOCAINE HCL (CARDIAC) 20 MG/ML IV SOLN
INTRAVENOUS | Status: DC | PRN
  Administered 2016-07-09: 100 mg via INTRAVENOUS

## 2016-07-09 MED ORDER — PANTOPRAZOLE SODIUM 20 MG PO TBEC
20.0000 mg | DELAYED_RELEASE_TABLET | Freq: Every day | ORAL | Status: DC
  Administered 2016-07-09 – 2016-07-11 (×3): 20 mg via ORAL
  Filled 2016-07-09 (×5): qty 1

## 2016-07-09 MED ORDER — HYDROMORPHONE HCL 1 MG/ML IJ SOLN: 0.5000 mg | INTRAMUSCULAR | Status: DC | PRN

## 2016-07-09 MED ORDER — PROMETHAZINE HCL 25 MG/ML IJ SOLN: 6.2500 mg | INTRAMUSCULAR | Status: DC | PRN

## 2016-07-09 MED ORDER — CEFAZOLIN SODIUM-DEXTROSE 2-4 GM/100ML-% IV SOLN
2.0000 g | Freq: Three times a day (TID) | INTRAVENOUS | Status: AC
  Administered 2016-07-09 – 2016-07-10 (×2): 2 g via INTRAVENOUS
  Filled 2016-07-09 (×2): qty 100

## 2016-07-09 MED ORDER — ACETAMINOPHEN 10 MG/ML IV SOLN
INTRAVENOUS | Status: DC | PRN
  Administered 2016-07-09: 1000 mg via INTRAVENOUS

## 2016-07-09 MED ORDER — DIPHENHYDRAMINE HCL 25 MG PO CAPS
25.0000 mg | ORAL_CAPSULE | ORAL | Status: DC | PRN
  Filled 2016-07-09: qty 1

## 2016-07-09 MED ORDER — SODIUM CHLORIDE 0.9 % IV SOLN
INTRAVENOUS | Status: DC
  Administered 2016-07-09 – 2016-07-11 (×4): via INTRAVENOUS

## 2016-07-09 MED ORDER — ACETAMINOPHEN 10 MG/ML IV SOLN: 1000.0000 mg | INTRAVENOUS | Status: DC

## 2016-07-09 MED ORDER — NALOXONE HCL 0.4 MG/ML IJ SOLN: 0.0800 mg | INTRAMUSCULAR | Status: DC | PRN

## 2016-07-09 MED ORDER — PROPOFOL 10 MG/ML IV BOLUS
INTRAVENOUS | Status: AC
  Filled 2016-07-09: qty 20

## 2016-07-09 MED ORDER — MICROFIBRILLAR COLL HEMOSTAT EX PADS
MEDICATED_PAD | CUTANEOUS | Status: DC | PRN
  Administered 2016-07-09: 1 via TOPICAL

## 2016-07-09 MED ORDER — DEXAMETHASONE 4 MG PO TABS: 4.0000 mg | ORAL_TABLET | Freq: Three times a day (TID) | ORAL | Status: DC

## 2016-07-09 MED ORDER — DEXAMETHASONE SODIUM PHOSPHATE 10 MG/ML IJ SOLN
INTRAMUSCULAR | Status: DC | PRN
  Administered 2016-07-09: 10 mg via INTRAVENOUS

## 2016-07-09 MED ORDER — THROMBIN 5000 UNITS EX SOLR
OROMUCOSAL | Status: DC | PRN
  Administered 2016-07-09: 10 mL via TOPICAL

## 2016-07-09 MED ORDER — FENTANYL CITRATE (PF) 100 MCG/2ML IJ SOLN
25.0000 ug | INTRAMUSCULAR | Status: DC | PRN
  Administered 2016-07-09: 50 ug via INTRAVENOUS

## 2016-07-09 MED ORDER — HYDRALAZINE HCL 20 MG/ML IJ SOLN
5.0000 mg | INTRAMUSCULAR | Status: DC | PRN
  Administered 2016-07-11: 10 mg via INTRAVENOUS
  Filled 2016-07-09: qty 1

## 2016-07-09 MED ORDER — PRAVASTATIN SODIUM 20 MG PO TABS
20.0000 mg | ORAL_TABLET | Freq: Every day | ORAL | Status: DC
  Administered 2016-07-09 – 2016-07-10 (×2): 20 mg via ORAL
  Filled 2016-07-09 (×2): qty 1

## 2016-07-09 MED ORDER — FENTANYL CITRATE (PF) 100 MCG/2ML IJ SOLN
INTRAMUSCULAR | Status: DC | PRN
  Administered 2016-07-09 (×2): 50 ug via INTRAVENOUS

## 2016-07-09 MED ORDER — FENTANYL CITRATE (PF) 250 MCG/5ML IJ SOLN
INTRAMUSCULAR | Status: AC
  Filled 2016-07-09: qty 5

## 2016-07-09 MED ORDER — SODIUM CHLORIDE 0.9 % IV SOLN
750.0000 mg | INTRAVENOUS | Status: AC
  Administered 2016-07-09: 750 mg via INTRAVENOUS
  Filled 2016-07-09: qty 7.5

## 2016-07-09 MED ORDER — MANNITOL 20 % IV SOLN
INTRAVENOUS | Status: DC | PRN
  Administered 2016-07-09: 09:00:00 via INTRAVENOUS

## 2016-07-09 MED ORDER — FLEET ENEMA 7-19 GM/118ML RE ENEM: 1.0000 | ENEMA | Freq: Once | RECTAL | Status: DC | PRN

## 2016-07-09 MED ORDER — FENTANYL CITRATE (PF) 100 MCG/2ML IJ SOLN
INTRAMUSCULAR | Status: AC
  Administered 2016-07-09: 50 ug via INTRAVENOUS
  Filled 2016-07-09: qty 2

## 2016-07-09 MED ORDER — BUPIVACAINE-EPINEPHRINE (PF) 0.5% -1:200000 IJ SOLN
INTRAMUSCULAR | Status: DC | PRN
  Administered 2016-07-09: 5 mL via PERINEURAL
  Administered 2016-07-09: 20 mL via PERINEURAL

## 2016-07-09 MED ORDER — ROCURONIUM BROMIDE 100 MG/10ML IV SOLN
INTRAVENOUS | Status: DC | PRN
  Administered 2016-07-09 (×2): 20 mg via INTRAVENOUS
  Administered 2016-07-09: 80 mg via INTRAVENOUS
  Administered 2016-07-09: 10 mg via INTRAVENOUS

## 2016-07-09 MED ORDER — ONDANSETRON HCL 4 MG/2ML IJ SOLN
INTRAMUSCULAR | Status: DC | PRN
  Administered 2016-07-09: 4 mg via INTRAVENOUS

## 2016-07-09 MED ORDER — SODIUM CHLORIDE 0.9 % IV SOLN
0.0500 ug/kg/min | INTRAVENOUS | Status: AC
  Administered 2016-07-09: 0.15 ug/kg/min via INTRAVENOUS
  Filled 2016-07-09: qty 5000

## 2016-07-09 MED ORDER — ONDANSETRON HCL 4 MG/2ML IJ SOLN: 4.0000 mg | INTRAMUSCULAR | Status: DC | PRN

## 2016-07-09 MED ORDER — DEXAMETHASONE 4 MG PO TABS
4.0000 mg | ORAL_TABLET | Freq: Four times a day (QID) | ORAL | Status: AC
  Administered 2016-07-10 – 2016-07-11 (×4): 4 mg via ORAL
  Filled 2016-07-09 (×4): qty 1

## 2016-07-09 MED ORDER — HYDROCODONE-ACETAMINOPHEN 5-325 MG PO TABS
1.0000 | ORAL_TABLET | ORAL | Status: DC | PRN
  Administered 2016-07-10 – 2016-07-11 (×3): 1 via ORAL
  Filled 2016-07-09 (×3): qty 1

## 2016-07-09 MED ORDER — PNEUMOCOCCAL VAC POLYVALENT 25 MCG/0.5ML IJ INJ
0.5000 mL | INJECTION | INTRAMUSCULAR | Status: DC
  Filled 2016-07-09: qty 0.5

## 2016-07-09 MED ORDER — HEMOSTATIC AGENTS (NO CHARGE) OPTIME
TOPICAL | Status: DC | PRN
  Administered 2016-07-09: 1 via TOPICAL

## 2016-07-09 MED ORDER — BISACODYL 5 MG PO TBEC
5.0000 mg | DELAYED_RELEASE_TABLET | Freq: Every day | ORAL | Status: DC | PRN
Start: 2016-07-09 — End: 2016-07-11

## 2016-07-09 MED ORDER — ONDANSETRON HCL 4 MG/2ML IJ SOLN
INTRAMUSCULAR | Status: AC
  Filled 2016-07-09: qty 2

## 2016-07-09 MED ORDER — LIDOCAINE-EPINEPHRINE 2 %-1:100000 IJ SOLN
INTRAMUSCULAR | Status: DC | PRN
  Administered 2016-07-09: 5 mL via INTRADERMAL

## 2016-07-09 MED ORDER — 0.9 % SODIUM CHLORIDE (POUR BTL) OPTIME
TOPICAL | Status: DC | PRN
  Administered 2016-07-09 (×3): 1000 mL

## 2016-07-09 MED ORDER — SODIUM CHLORIDE 0.9 % IV SOLN
INTRAVENOUS | Status: DC | PRN
  Administered 2016-07-09 (×2): via INTRAVENOUS

## 2016-07-09 MED ORDER — SODIUM CHLORIDE 0.9 % IV SOLN
INTRAVENOUS | Status: DC | PRN
  Administered 2016-07-09 (×2): via INTRAVENOUS

## 2016-07-09 MED ORDER — PROMETHAZINE HCL 12.5 MG PO TABS
12.5000 mg | ORAL_TABLET | ORAL | Status: DC | PRN
  Filled 2016-07-09: qty 2

## 2016-07-09 MED ORDER — OXYCODONE HCL 5 MG PO TABS
ORAL_TABLET | ORAL | Status: AC
  Administered 2016-07-09: 5 mg via ORAL
  Filled 2016-07-09: qty 1

## 2016-07-09 MED ORDER — SODIUM CHLORIDE 0.9 % IV SOLN
INTRAVENOUS | Status: DC | PRN
  Administered 2016-07-09: 08:00:00 via INTRAVENOUS

## 2016-07-09 MED ORDER — ARTIFICIAL TEARS OP OINT
TOPICAL_OINTMENT | OPHTHALMIC | Status: DC | PRN
  Administered 2016-07-09: 1 via OPHTHALMIC

## 2016-07-09 MED ORDER — NICARDIPINE HCL IN NACL 20-0.86 MG/200ML-% IV SOLN
INTRAVENOUS | Status: DC | PRN
  Administered 2016-07-09: 5 mg/h via INTRAVENOUS

## 2016-07-09 MED ORDER — INDOCYANINE GREEN 25 MG IV SOLR
5.0000 mg | Freq: Once | INTRAVENOUS | Status: AC
  Administered 2016-07-09 (×2): 12.5 mg via INTRAVENOUS
  Filled 2016-07-09: qty 25

## 2016-07-09 MED ORDER — OXYCODONE HCL 5 MG PO TABS
5.0000 mg | ORAL_TABLET | Freq: Once | ORAL | Status: AC | PRN
  Administered 2016-07-09: 5 mg via ORAL

## 2016-07-09 MED ORDER — LABETALOL HCL 5 MG/ML IV SOLN
INTRAVENOUS | Status: DC | PRN
  Administered 2016-07-09: 5 mg via INTRAVENOUS

## 2016-07-09 MED ORDER — DEXAMETHASONE 6 MG PO TABS
6.0000 mg | ORAL_TABLET | Freq: Four times a day (QID) | ORAL | Status: AC
  Administered 2016-07-09 – 2016-07-10 (×4): 6 mg via ORAL
  Filled 2016-07-09 (×4): qty 1

## 2016-07-09 MED ORDER — LEVETIRACETAM 750 MG PO TABS
750.0000 mg | ORAL_TABLET | Freq: Two times a day (BID) | ORAL | Status: DC
  Administered 2016-07-09 – 2016-07-11 (×4): 750 mg via ORAL
  Filled 2016-07-09 (×4): qty 1

## 2016-07-09 MED ORDER — SUGAMMADEX SODIUM 200 MG/2ML IV SOLN
INTRAVENOUS | Status: AC
  Filled 2016-07-09: qty 2

## 2016-07-09 MED ORDER — FUROSEMIDE 10 MG/ML IJ SOLN
INTRAMUSCULAR | Status: AC
  Administered 2016-07-09: 10 mg via INTRAMUSCULAR
  Filled 2016-07-09: qty 4

## 2016-07-09 MED ORDER — NICARDIPINE HCL IN NACL 20-0.86 MG/200ML-% IV SOLN
3.0000 mg/h | INTRAVENOUS | Status: DC
  Filled 2016-07-09: qty 200

## 2016-07-09 MED ORDER — CEFAZOLIN SODIUM-DEXTROSE 2-4 GM/100ML-% IV SOLN
2.0000 g | INTRAVENOUS | Status: AC
  Administered 2016-07-09 (×2): 2 g via INTRAVENOUS
  Filled 2016-07-09: qty 100

## 2016-07-09 MED ORDER — PHENYLEPHRINE HCL 10 MG/ML IJ SOLN
10.0000 mg | INTRAMUSCULAR | Status: DC | PRN
  Administered 2016-07-09: 40 ug/min via INTRAVENOUS

## 2016-07-09 MED ORDER — NITROGLYCERIN IN D5W 200-5 MCG/ML-% IV SOLN
0.0000 ug/min | INTRAVENOUS | Status: DC
Start: 2016-07-09 — End: 2016-07-09
  Filled 2016-07-09: qty 250

## 2016-07-09 MED ORDER — OXYCODONE HCL 5 MG/5ML PO SOLN: 5.0000 mg | Freq: Once | ORAL | Status: AC | PRN

## 2016-07-09 MED ORDER — ROCURONIUM BROMIDE 50 MG/5ML IV SOLN
INTRAVENOUS | Status: AC
  Filled 2016-07-09: qty 2

## 2016-07-09 MED ORDER — ROCURONIUM BROMIDE 50 MG/5ML IV SOLN
INTRAVENOUS | Status: AC
  Filled 2016-07-09: qty 1

## 2016-07-09 MED ORDER — MIDAZOLAM HCL 5 MG/5ML IJ SOLN
INTRAMUSCULAR | Status: DC | PRN
  Administered 2016-07-09: 2 mg via INTRAVENOUS

## 2016-07-09 MED ORDER — SENNA 8.6 MG PO TABS
1.0000 | ORAL_TABLET | Freq: Two times a day (BID) | ORAL | Status: DC
  Administered 2016-07-09 – 2016-07-11 (×4): 8.6 mg via ORAL
  Filled 2016-07-09 (×4): qty 1

## 2016-07-09 MED ORDER — SUGAMMADEX SODIUM 200 MG/2ML IV SOLN
INTRAVENOUS | Status: DC | PRN
  Administered 2016-07-09: 200 mg via INTRAVENOUS

## 2016-07-09 MED ORDER — ONDANSETRON HCL 4 MG PO TABS: 4.0000 mg | ORAL_TABLET | ORAL | Status: DC | PRN

## 2016-07-09 MED ORDER — GLYCOPYRROLATE 0.2 MG/ML IV SOSY
PREFILLED_SYRINGE | INTRAVENOUS | Status: AC
  Filled 2016-07-09: qty 3

## 2016-07-09 MED ORDER — PROPOFOL 10 MG/ML IV BOLUS
INTRAVENOUS | Status: DC | PRN
  Administered 2016-07-09: 50 mg via INTRAVENOUS
  Administered 2016-07-09: 150 mg via INTRAVENOUS
  Administered 2016-07-09: 50 mg via INTRAVENOUS

## 2016-07-09 MED ORDER — MIDAZOLAM HCL 2 MG/2ML IJ SOLN
INTRAMUSCULAR | Status: AC
  Filled 2016-07-09: qty 2

## 2016-07-09 MED ORDER — DEXAMETHASONE SODIUM PHOSPHATE 10 MG/ML IJ SOLN
INTRAMUSCULAR | Status: AC
  Filled 2016-07-09: qty 1

## 2016-07-09 MED ORDER — INDOCYANINE GREEN 25 MG IV SOLR
5.0000 mg | Freq: Once | INTRAVENOUS | Status: DC
  Filled 2016-07-09: qty 25

## 2016-07-09 SURGICAL SUPPLY — 101 items
APL SKNCLS STERI-STRIP NONHPOA (GAUZE/BANDAGES/DRESSINGS)
APPLICATOR CHLORAPREP 3ML ORNG (MISCELLANEOUS) ×2 IMPLANT
BATTERY IQ STERILE (MISCELLANEOUS) ×2 IMPLANT
BENZOIN TINCTURE PRP APPL 2/3 (GAUZE/BANDAGES/DRESSINGS) IMPLANT
BLADE CLIPPER SURG (BLADE) ×2 IMPLANT
BLADE MINI RND TIP GREEN BEAV (BLADE) ×1 IMPLANT
BLADE ULTRA TIP 2M (BLADE) ×2 IMPLANT
BNDG GAUZE ELAST 4 BULKY (GAUZE/BANDAGES/DRESSINGS) IMPLANT
BRUSH SCRUB EZ 1% IODOPHOR (MISCELLANEOUS) ×2 IMPLANT
BTRY SRG DRVR 1.5 IQ (MISCELLANEOUS) ×1
BUR ACORN 6.0 PRECISION (BURR) ×2 IMPLANT
BUR ADDG 1.1 (BURR) IMPLANT
BUR MATCHSTICK NEURO 3.0 LAGG (BURR) IMPLANT
BUR SPIRAL ROUTER 2.3 (BUR) IMPLANT
CANISTER SUCT 3000ML PPV (MISCELLANEOUS) ×2 IMPLANT
CATH ROBINSON RED A/P 14FR (CATHETERS) IMPLANT
CLIP ANEURY TI PERM STD 8.3 (Clip) ×2 IMPLANT
CLIP ANEURY TI PERM STD CVD 8M (Clip) ×1 IMPLANT
CLIP ANEURY TI PERM STD STR 11 (Clip) ×1 IMPLANT
CLIP ANEURY TI PERM STD STR 9M (Clip) ×1 IMPLANT
CLIP ANEURY TI PERM STDANG11.4 (Clip) ×1 IMPLANT
CLIP ANEURY TI TEMP STD STR 9M (Clip) ×1 IMPLANT
CLIP TI MEDIUM 6 (CLIP) IMPLANT
DRAIN SNY WOU 7FLT (WOUND CARE) IMPLANT
DRAPE NEUROLOGICAL W/INCISE (DRAPES) ×2 IMPLANT
DRAPE SHEET LG 3/4 BI-LAMINATE (DRAPES) ×4 IMPLANT
DRAPE SURG 17X23 STRL (DRAPES) IMPLANT
DRAPE WARM FLUID 44X44 (DRAPE) ×2 IMPLANT
DRSG MEPILEX BORDER 4X12 (GAUZE/BANDAGES/DRESSINGS) IMPLANT
DRSG MEPILEX BORDER 4X8 (GAUZE/BANDAGES/DRESSINGS) IMPLANT
ELECT REM PT RETURN 9FT ADLT (ELECTROSURGICAL) ×2
ELECTRODE REM PT RTRN 9FT ADLT (ELECTROSURGICAL) ×1 IMPLANT
EVACUATOR 1/8 PVC DRAIN (DRAIN) IMPLANT
EVACUATOR SILICONE 100CC (DRAIN) IMPLANT
FORCEPS BIPOLAR SPETZLER 8 1.0 (NEUROSURGERY SUPPLIES) ×2 IMPLANT
FORMULA INJECT OSTEOVATION 10 (Neurostimulator) ×1 IMPLANT
GAUZE SPONGE 4X4 12PLY STRL (GAUZE/BANDAGES/DRESSINGS) ×2 IMPLANT
GAUZE SPONGE 4X4 16PLY XRAY LF (GAUZE/BANDAGES/DRESSINGS) IMPLANT
GLOVE BIO SURGEON STRL SZ 6.5 (GLOVE) ×1 IMPLANT
GLOVE BIOGEL PI IND STRL 6.5 (GLOVE) IMPLANT
GLOVE BIOGEL PI IND STRL 7.5 (GLOVE) ×1 IMPLANT
GLOVE BIOGEL PI INDICATOR 6.5 (GLOVE) ×1
GLOVE BIOGEL PI INDICATOR 7.5 (GLOVE) ×2
GLOVE EXAM NITRILE LRG STRL (GLOVE) ×2 IMPLANT
GLOVE EXAM NITRILE MD LF STRL (GLOVE) IMPLANT
GLOVE EXAM NITRILE XL STR (GLOVE) IMPLANT
GLOVE EXAM NITRILE XS STR PU (GLOVE) IMPLANT
GLOVE SS BIOGEL STRL SZ 7 (GLOVE) ×3 IMPLANT
GLOVE SUPERSENSE BIOGEL SZ 7 (GLOVE) ×3
GOWN STRL REUS W/ TWL LRG LVL3 (GOWN DISPOSABLE) ×1 IMPLANT
GOWN STRL REUS W/ TWL XL LVL3 (GOWN DISPOSABLE) IMPLANT
GOWN STRL REUS W/TWL 2XL LVL3 (GOWN DISPOSABLE) IMPLANT
GOWN STRL REUS W/TWL LRG LVL3 (GOWN DISPOSABLE) ×8
GOWN STRL REUS W/TWL XL LVL3 (GOWN DISPOSABLE) ×6
HEMOSTAT POWDER KIT SURGIFOAM (HEMOSTASIS) ×2 IMPLANT
HEMOSTAT POWDER SURGIFOAM 1G (HEMOSTASIS) ×1 IMPLANT
HEMOSTAT SURGICEL 2X14 (HEMOSTASIS) IMPLANT
HOOK DURA 1/2IN (MISCELLANEOUS) ×2 IMPLANT
KIT BASIN OR (CUSTOM PROCEDURE TRAY) ×2 IMPLANT
KIT ROOM TURNOVER OR (KITS) ×2 IMPLANT
KNIFE ARACHNOID DISP AM-24-XSB (BLADE) ×1 IMPLANT
NDL HYPO 21X1.5 SAFETY (NEEDLE) ×1 IMPLANT
NDL HYPO 25X1 1.5 SAFETY (NEEDLE) ×1 IMPLANT
NEEDLE HYPO 21X1.5 SAFETY (NEEDLE) ×2 IMPLANT
NEEDLE HYPO 25X1 1.5 SAFETY (NEEDLE) ×2 IMPLANT
NS IRRIG 1000ML POUR BTL (IV SOLUTION) ×4 IMPLANT
PACK CRANIOTOMY (CUSTOM PROCEDURE TRAY) ×2 IMPLANT
PATTIES SURGICAL .25X.25 (GAUZE/BANDAGES/DRESSINGS) ×1 IMPLANT
PATTIES SURGICAL .5 X.5 (GAUZE/BANDAGES/DRESSINGS) ×1 IMPLANT
PATTIES SURGICAL .5 X3 (DISPOSABLE) IMPLANT
PATTIES SURGICAL 1X1 (DISPOSABLE) IMPLANT
PIN MAYFIELD SKULL DISP (PIN) ×2 IMPLANT
PLATE 1.5  2HOLE LNG NEURO (Plate) ×3 IMPLANT
PLATE 1.5 2HOLE LNG NEURO (Plate) IMPLANT
PROBE FOR NEUROSURGERY (MISCELLANEOUS) ×1 IMPLANT
SCREW SELF DRILL HT 1.5/4MM (Screw) ×6 IMPLANT
SPONGE NEURO XRAY DETECT 1X3 (DISPOSABLE) IMPLANT
SPONGE SURGIFOAM ABS GEL 100 (HEMOSTASIS) ×2 IMPLANT
SPONGE SURGIFOAM ABS GEL 100C (HEMOSTASIS) ×2 IMPLANT
STAPLER VISISTAT 35W (STAPLE) ×2 IMPLANT
STOCKINETTE 6  STRL (DRAPES) ×1
STOCKINETTE 6 STRL (DRAPES) ×1 IMPLANT
STRIP SURGICAL 1 X 6 IN (GAUZE/BANDAGES/DRESSINGS) ×2 IMPLANT
STRIP SURGICAL 1/2 X 6 IN (GAUZE/BANDAGES/DRESSINGS) ×1 IMPLANT
STRIP SURGICAL 1/4 X 6 IN (GAUZE/BANDAGES/DRESSINGS) ×1 IMPLANT
STRIP SURGICAL 2 X 6 IN (GAUZE/BANDAGES/DRESSINGS) ×1 IMPLANT
STRIP SURGICAL 3/4 X 6 IN (GAUZE/BANDAGES/DRESSINGS) ×1 IMPLANT
SUT ETHILON 3 0 FSL (SUTURE) IMPLANT
SUT ETHILON 3 0 PS 1 (SUTURE) IMPLANT
SUT NURALON 4 0 TR CR/8 (SUTURE) ×4 IMPLANT
SUT VIC AB 0 CT1 18XCR BRD8 (SUTURE) ×2 IMPLANT
SUT VIC AB 0 CT1 8-18 (SUTURE) ×4
SUT VIC AB 2-0 CT1 18 (SUTURE) ×4 IMPLANT
SYR 30ML LL (SYRINGE) ×4 IMPLANT
SYSTEM DELIVERY OSTEOVATION (MISCELLANEOUS) ×1 IMPLANT
TOWEL OR 17X24 6PK STRL BLUE (TOWEL DISPOSABLE) ×2 IMPLANT
TOWEL OR 17X26 10 PK STRL BLUE (TOWEL DISPOSABLE) ×2 IMPLANT
TRAY FOLEY W/METER SILVER 16FR (SET/KITS/TRAYS/PACK) ×2 IMPLANT
TUBE CONNECTING 12X1/4 (SUCTIONS) ×2 IMPLANT
UNDERPAD 30X30 INCONTINENT (UNDERPADS AND DIAPERS) ×2 IMPLANT
WATER STERILE IRR 1000ML POUR (IV SOLUTION) ×2 IMPLANT

## 2016-07-09 NOTE — Op Note (Signed)
07/09/2016  2:41 PM  PATIENT:  [name] [name]  55 y.o. female  PRE-OPERATIVE DIAGNOSIS:  Left MCA aneurysm  POST-OPERATIVE DIAGNOSIS:  Same  PROCEDURE:  Left pterional craniotomy for clipping of middle cerebral artery aneurysm  SURGEON:  Aldean Ast, MD  ASSISTANTS: Ashok Pall, MD  ANESTHESIA:   General  DRAINS: JP   SPECIMEN:  None  INDICATION FOR PROCEDURE: 55 year old woman with multiple intracranial aneurysms and prior SAH.  I recommended the above listed operation. Patient understood the risks, benefits, and alternatives and potential outcomes and wished to proceed.  PROCEDURE DETAILS: After smooth induction of general endotracheal anesthesia the patient was transferred to the operative table. The head was fixated to the table using Mayfield pins. The left frontotemporal area was clipped of hair and wiped with alcohol. Lidocaine and marcaine with epinephrine was injected along the line of the planned incision. The patient was then prepped and draped in the usual sterile fashion.   A timeout was performed. A curvilinear frontotemporal incision was made and a musculocutaneous flap was reflected anteriorly. A small pterional craniotomy was fashioned using a matchstick burr. The lesser wing of the sphenoid to the level of the meningoorbital band as well as the orbital roof were drilled flat to enhance exposure. The dura was then opened in a curved fashion and reflected anteriorly.  The frontal lobe was elevated to identify the optic nerve entering the optic canal canal. The arachnoid cisterns were widely opened and CSF was removed to produce brain relaxation. The carotid artery was then identified. Dissection of the sylvian fissure was performed using sharp dissection and bipolar cautery. . The aneurysm was identified at the bifurcation of the middle cerebral artery.  Both domes of the bilobed aneurysm were dissected.  All branch vessels were identified.  A  gently curved clip was applied across the neck of the aneurysm. This was followed by a second clip in tandem just above it.  The branch vessels were not included in the clip. ICG angiography was performed which showed obliteration of the aneurysm and patency of the branch vessels. The aneurysm was then sharply opened and deflated.  I irrigated vigorously and obtained meticulous hemostasis. The bone flap was secured using titanium fixation plates. Bone void filler was used to repair the craniectomy defects. A drain was placed. Further irrigation was performed and the temporals and galea were closed with interrupted vicryl sutures. The skin was closed with staples.  The patient was taken out of Mayfield pins. She awoke from anesthesia without new apparent neurological deficits. She is transferred to the ICU. Counts were correct.  PATIENT DISPOSITION:  ICU - extubated and stable.   Delay start of Pharmacological VTE agent (>24hrs) due to surgical blood loss or risk of bleeding:  yes

## 2016-07-09 NOTE — Anesthesia Postprocedure Evaluation (Signed)
Anesthesia Post Note  Patient: [name] [name]  Procedure(s) Performed: Procedure(s) (LRB): Craniotomy for clipping of left middle cerebral artery aneurysm (N/A)  Patient location during evaluation: PACU Anesthesia Type: General Level of consciousness: awake and alert Pain management: pain level controlled Vital Signs Assessment: post-procedure vital signs reviewed and stable Respiratory status: spontaneous breathing, nonlabored ventilation, respiratory function stable and patient connected to nasal cannula oxygen Cardiovascular status: blood pressure returned to baseline and stable Postop Assessment: no signs of nausea or vomiting Anesthetic complications: no    Last Vitals:  Filed Vitals:   07/09/16 0645  BP: 140/105  Pulse: 65  Temp: 36.8 C  Resp: 20    Last Pain: There were no vitals filed for this visit.               Zenaida Deed

## 2016-07-09 NOTE — Anesthesia Preprocedure Evaluation (Signed)
Anesthesia Evaluation  Patient identified by MRN, date of birth, ID band Patient awake    Reviewed: Allergy & Precautions, H&P , NPO status , Patient's Chart, lab work & pertinent test results  Airway Mallampati: II  TM Distance: >3 FB Neck ROM: Full    Dental  (+) Poor Dentition, Missing   Pulmonary Current Smoker,    Pulmonary exam normal breath sounds clear to auscultation- rhonchi       Cardiovascular hypertension,  Rhythm:Regular Rate:Normal     Neuro/Psych negative neurological ROS     GI/Hepatic negative GI ROS, Neg liver ROS,   Endo/Other  negative endocrine ROS  Renal/GU negative Renal ROS  negative genitourinary   Musculoskeletal   Abdominal Normal abdominal exam  (+)   Peds  Hematology negative hematology ROS (+)   Anesthesia Other Findings Denies cardiac or pulmonary symptoms  Reproductive/Obstetrics negative OB ROS                             Anesthesia Physical  Anesthesia Plan  ASA: III  Anesthesia Plan: General   Post-op Pain Management:    Induction: Intravenous  Airway Management Planned: Oral ETT  Additional Equipment: Arterial line and CVP  Intra-op Plan:   Post-operative Plan: Possible Post-op intubation/ventilation  Informed Consent: I have reviewed the patients History and Physical, chart, labs and discussed the procedure including the risks, benefits and alternatives for the proposed anesthesia with the patient or authorized representative who has indicated his/her understanding and acceptance.   Dental advisory given  Plan Discussed with: CRNA and Surgeon  Anesthesia Plan Comments:         Anesthesia Quick Evaluation

## 2016-07-09 NOTE — Anesthesia Procedure Notes (Addendum)
Central Venous Catheter Insertion Performed by: anesthesiologist Patient location: Pre-op. Preanesthetic checklist: patient identified, IV checked, site marked, risks and benefits discussed, surgical consent, monitors and equipment checked, pre-op evaluation, timeout performed and anesthesia consent Lidocaine 1% used for infiltration Landmarks identified Catheter size: 8 Fr Central line was placed.Double lumen Procedure performed using ultrasound guided technique. Attempts: 1 Following insertion, dressing applied and line sutured. Post procedure assessment: blood return through all ports. Patient tolerated the procedure well with no immediate complications.   Procedure Name: Intubation Date/Time: 07/09/2016 8:51 AM Performed by: Izora Gala Pre-anesthesia Checklist: Patient identified, Emergency Drugs available, Suction available and Patient being monitored Patient Re-evaluated:Patient Re-evaluated prior to inductionOxygen Delivery Method: Circle System Utilized Preoxygenation: Pre-oxygenation with 100% oxygen Intubation Type: IV induction Ventilation: Mask ventilation without difficulty Laryngoscope Size: Mac and 3 Grade View: Grade I Tube type: Oral Tube size: 7.0 mm Number of attempts: 1 Airway Equipment and Method: Stylet and Oral airway Placement Confirmation: ETT inserted through vocal cords under direct vision,  positive ETCO2 and breath sounds checked- equal and bilateral Secured at: 21 cm Tube secured with: Tape Dental Injury: Teeth and Oropharynx as per pre-operative assessment

## 2016-07-09 NOTE — Progress Notes (Signed)
Awake and alert Moving all extremities well Stable

## 2016-07-09 NOTE — Transfer of Care (Signed)
Immediate Anesthesia Transfer of Care Note  Patient: [name] [name]  Procedure(s) Performed: Procedure(s) with comments: Craniotomy for clipping of left middle cerebral artery aneurysm (N/A) - Craniotomy for clipping of left middle cerebral artery aneurysm  Patient Location: PACU  Anesthesia Type:General  Level of Consciousness: awake, alert , oriented and patient cooperative  Airway & Oxygen Therapy: Patient Spontanous Breathing and Patient connected to nasal cannula oxygen  Post-op Assessment: Report given to RN, Post -op Vital signs reviewed and stable, Patient moving all extremities and Patient moving all extremities X 4  Post vital signs: Reviewed and stable  Last Vitals:  Filed Vitals:   07/09/16 0645  BP: 140/105  Pulse: 65  Temp: 36.8 C  Resp: 20    Last Pain: There were no vitals filed for this visit.       Complications: No apparent anesthesia complications

## 2016-07-09 NOTE — Addendum Note (Signed)
Addendum  created 07/09/16 1519 by Izora Gala, CRNA   Modules edited: Anesthesia Events, Narrator   Narrator:  Narrator: Event Log Edited

## 2016-07-09 NOTE — H&P (Signed)
CC:  No chief complaint on file.   HPI: [name] [name] is a 55 y.o. female with multiple intracranial aneurysms presents for elective clipping of a left middle cerebral artery aneurysm.  No changes since clinic.  PMH: Past Medical History  Diagnosis Date  . SVT (supraventricular tachycardia) ([place])   . WPW (Wolff-Parkinson-White syndrome)   . Hypertension     QUIT TAKING BP MEDS NONE IN 10+ YEARS  . Thyroid disease     PROCEDURE FOR THYROID 15 YRS AGO AT [facility]  . Headache     PSH: Past Surgical History  Procedure Laterality Date  . Radiology with anesthesia N/A 01/03/2016    Procedure: RADIOLOGY WITH ANESTHESIA;  Surgeon: Medication Radiologist, MD;  Location: [facility];  Service: Radiology;  Laterality: N/A;  . Craniotomy N/A 01/04/2016    Procedure: Suboccipital Craniotomy and Cervical one Laminectomy for Clipping of Aneurysm;  Surgeon: Kevan Ny Ditty, MD;  Location: [facility];  Service: Neurosurgery;  Laterality: N/A;  . Ventriculoperitoneal shunt Right 02/04/2016    Procedure: SHUNT INSERTION VENTRICULAR-PERITONEAL With Laparoscopic Assistance;  Surgeon: Kevan Ny Ditty, MD;  Location: [facility];  Service: Neurosurgery;  Laterality: Right;  . Laparoscopic revision ventricular-peritoneal (v-p) shunt N/A 02/04/2016    Procedure: LAPAROSCOPIC Insertion VENTRICULAR-PERITONEAL (V-P) SHUNT;  Surgeon: Rolm Bookbinder, MD;  Location: [facility];  Service: General;  Laterality: N/A;  . Breast surgery      BX RT BREAST  BENIGN  . Electrophysiologic study      SH: Social History  Substance Use Topics  . Smoking status: Current Every Day Smoker -- 1.00 packs/day  . Smokeless tobacco: None  . Alcohol Use: No    MEDS: Prior to Admission medications   Medication Sig Start Date End Date Taking? Authorizing Provider  lovastatin (MEVACOR) 20 MG tablet Take 20 mg by mouth at bedtime.   Yes Historical Provider, MD  diphenhydrAMINE (BENADRYL) 25 MG tablet Take 1 tablet (25  mg total) by mouth every 4 (four) hours as needed for itching. 09/04/15   Tammy Triplett, PA-C    ALLERGY: Allergies  Allergen Reactions  . No Known Allergies     ROS: ROS  NEUROLOGIC EXAM: Awake, alert, oriented Memory and concentration grossly intact Speech fluent, appropriate CN grossly intact Motor exam: Upper Extremities Deltoid Bicep Tricep Grip  Right 5/5 5/5 5/5 5/5  Left 5/5 5/5 5/5 5/5   Lower Extremity IP Quad PF DF EHL  Right 5/5 5/5 5/5 5/5 5/5  Left 5/5 5/5 5/5 5/5 5/5   Sensation grossly intact to LT  IMAGING: No new imaging  IMPRESSION: - 55 y.o. female with multiple intracranial aneurysms.  PLAN: - Left pterional craniotomy for aneurysm clipping - I have had a long discussion with the patient about the risks and benefits of surgery and alternatives.  She wishes to proceed.

## 2016-07-10 ENCOUNTER — Encounter (HOSPITAL_COMMUNITY): Payer: Self-pay | Admitting: Radiology

## 2016-07-10 ENCOUNTER — Inpatient Hospital Stay (HOSPITAL_COMMUNITY): Payer: BLUE CROSS/BLUE SHIELD

## 2016-07-10 LAB — CBC
HCT: 39.1 % (ref 36.0–46.0)
Hemoglobin: 12.4 g/dL (ref 12.0–15.0)
MCH: 26.7 pg (ref 26.0–34.0)
MCHC: 31.7 g/dL (ref 30.0–36.0)
MCV: 84.3 fL (ref 78.0–100.0)
Platelets: 236 10*3/uL (ref 150–400)
RBC: 4.64 MIL/uL (ref 3.87–5.11)
RDW: 15.1 % (ref 11.5–15.5)
WBC: 15.1 10*3/uL — ABNORMAL HIGH (ref 4.0–10.5)

## 2016-07-10 LAB — BASIC METABOLIC PANEL
Anion gap: 4 — ABNORMAL LOW (ref 5–15)
BUN: 12 mg/dL (ref 6–20)
CO2: 25 mmol/L (ref 22–32)
Calcium: 8.8 mg/dL — ABNORMAL LOW (ref 8.9–10.3)
Chloride: 112 mmol/L — ABNORMAL HIGH (ref 101–111)
Creatinine, Ser: 1.23 mg/dL — ABNORMAL HIGH (ref 0.44–1.00)
GFR calc Af Amer: 56 mL/min — ABNORMAL LOW (ref >60)
GFR calc non Af Amer: 48 mL/min — ABNORMAL LOW (ref >60)
Glucose, Bld: 121 mg/dL — ABNORMAL HIGH (ref 65–99)
Potassium: 4.4 mmol/L (ref 3.5–5.1)
Sodium: 141 mmol/L (ref 135–145)

## 2016-07-10 MED ORDER — LEVETIRACETAM 750 MG PO TABS: 750.0000 mg | ORAL_TABLET | Freq: Two times a day (BID) | ORAL | Status: DC

## 2016-07-10 MED ORDER — HYDROCODONE-ACETAMINOPHEN 5-325 MG PO TABS: 1.0000 | ORAL_TABLET | ORAL | Status: DC | PRN

## 2016-07-10 MED ORDER — DOCUSATE SODIUM 100 MG PO CAPS: 100.0000 mg | ORAL_CAPSULE | Freq: Two times a day (BID) | ORAL | Status: DC

## 2016-07-10 NOTE — Evaluation (Signed)
Occupational Therapy Evaluation Patient Details Name: [name] [name] MRN: [id number] DOB: Jun 20, 1961 Today's Date: 07/10/2016    History of Present Illness Pt is a 55 y.o. female s/p Craniotomy for clipping of left middle cerebral artery aneurysm. PMHx: SVT, WPW, HTN, Thyroid disease, Craniotomy 01/04/16, Ventriculoperitoneal shunt 02/04/16.    Clinical Impression   Pt reports she was independent with ADL and mobility PTA.Currenlty pt overall min assist for ADL and functional mobility secondary to decreased balance. Pt slightly lethargic during session but agreeable to get OOB and participate in therapy. Feel pt would benefit from another night in the hospital prior to d/c home. Recommending HHOT for follow up in order to maximize independence and safety with ADL and functional mobility upon return home. Pt would benefit from continued skilled OT to address established goals.    Follow Up Recommendations  Home health OT;Supervision/Assistance - 24 hour    Equipment Recommendations  None recommended by OT    Recommendations for Other Services       Precautions / Restrictions Precautions Precautions: None Restrictions Weight Bearing Restrictions: No      Mobility Bed Mobility Overal bed mobility: Needs Assistance Bed Mobility: Supine to Sit     Supine to sit: Min guard     General bed mobility comments: Min guard for safety; no physical assist required.  Transfers Overall transfer level: Needs assistance Equipment used: None Transfers: Sit to/from Stand Sit to Stand: Min assist         General transfer comment: Min assist for balance upon standing.    Balance Overall balance assessment: Needs assistance Sitting-balance support: Feet supported;No upper extremity supported Sitting balance-Leahy Scale: Good     Standing balance support: No upper extremity supported;During functional activity Standing balance-Leahy Scale: Fair                               ADL Overall ADL's : Needs assistance/impaired Eating/Feeding: Independent;Sitting   Grooming: Min guard;Standing   Upper Body Bathing: Supervision/ safety;Sitting   Lower Body Bathing: Minimal assistance;Sit to/from stand Lower Body Bathing Details (indicate cue type and reason): assist for balance in standing Upper Body Dressing : Set up;Supervision/safety;Sitting   Lower Body Dressing: Minimal assistance;Sit to/from stand Lower Body Dressing Details (indicate cue type and reason): Pt able to adjust socks sitting EOB. Assist for balance in standing. Toilet Transfer: Minimal assistance;Ambulation;BSC Toilet Transfer Details (indicate cue type and reason): Simulated by sit to stand from EOB.          Functional mobility during ADLs: Minimal assistance General ADL Comments: Assist for balance in standing and with functional mobility. Pt slightly lethargic; asleep upon arrival and closing eyes throughout initial part of session.     Vision Additional Comments: Pt reports she has glasses at home that she is supposed to wear but she does not. Pt reports blurred vision but feels that she just needs to wear her glasses.   Perception     Praxis      Pertinent Vitals/Pain Pain Assessment: No/denies pain     Hand Dominance Right   Extremity/Trunk Assessment Upper Extremity Assessment Upper Extremity Assessment: Overall WFL for tasks assessed   Lower Extremity Assessment Lower Extremity Assessment: Defer to PT evaluation   Cervical / Trunk Assessment Cervical / Trunk Assessment: Normal   Communication Communication Communication: No difficulties   Cognition Arousal/Alertness: Lethargic Behavior During Therapy: WFL for tasks assessed/performed Overall Cognitive Status: Within Functional Limits for tasks  assessed                     General Comments       Exercises       Shoulder Instructions      Home Living Family/patient expects to be discharged to::  Private residence Living Arrangements: Parent Available Help at Discharge: Family;Available 24 hours/day Type of Home: House Home Access: Stairs to enter [employer] of Steps: 2   Home Layout: One level     Bathroom Shower/Tub: Curtain;Tub only   Biochemist, clinical: Standard     Home Equipment: Environmental consultant - 2 wheels;Shower seat;Bedside commode          Prior Functioning/Environment Level of Independence: Independent        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    OT Diagnosis: Generalized weakness   OT Problem List: Impaired balance (sitting and/or standing);Impaired vision/perception;Decreased knowledge of use of DME or AE   OT Treatment/Interventions: Self-care/ADL training;Energy conservation;DME and/or AE instruction;Therapeutic activities;Patient/family education;Balance training    OT Goals(Current goals can be found in the care plan section) Acute Rehab OT Goals Patient Stated Goal: return home OT Goal Formulation: With patient Time For Goal Achievement: 07/24/16 Potential to Achieve Goals: Good ADL Goals Pt Will Perform Grooming: with supervision;standing Pt Will Perform Upper Body Bathing: with supervision;sitting Pt Will Perform Lower Body Bathing: with supervision;sit to/from stand Pt Will Transfer to Toilet: with supervision;ambulating;bedside commode Pt Will Perform Toileting - Clothing Manipulation and hygiene: with supervision;sit to/from stand Pt Will Perform Tub/Shower Transfer: with supervision;Tub transfer;ambulating;shower seat  OT Frequency: Min 2X/week   Barriers to D/C:            Co-evaluation PT/OT/SLP Co-Evaluation/Treatment: Yes Reason for Co-Treatment: Complexity of the patient's impairments (multi-system involvement)   OT goals addressed during session: ADL's and self-care;Other (comment) (functional mobility)      End of Session Equipment Utilized During Treatment: Gait belt Nurse Communication:  Mobility status  Activity Tolerance: Patient tolerated treatment well Patient left: in chair;with call bell/phone within reach   Time: 1107-1126 OT Time Calculation (min): 19 min Charges:  OT General Charges $OT Visit: 1 Procedure OT Evaluation $OT Eval Moderate Complexity: 1 Procedure G-Codes:     Binnie Kand M.S., OTR/L Pager: [phone]  07/10/2016, 12:17 PM

## 2016-07-10 NOTE — Progress Notes (Signed)
No acute events AVSS Moving all extremities well Incision looks good Drain out TTF Maybe home this afternoon

## 2016-07-10 NOTE — Evaluation (Signed)
Physical Therapy Evaluation Patient Details Name: [name] [name] MRN: [id number] DOB: May 08, 1961 Today's Date: 07/10/2016   History of Present Illness  Pt is a 55 y.o. female s/p Craniotomy for clipping of left middle cerebral artery aneurysm. PMHx: SVT, WPW, HTN, Thyroid disease, Craniotomy 01/04/16, Ventriculoperitoneal shunt 02/04/16.   Clinical Impression  Pt was able to get up and mobilize into the hallway with min guard to min assist.  She is slow on her feet and a bit lethargic today.  I would recommend one more night stay and HHPT f/u at discharge.   PT to follow acutely for deficits listed below.       Follow Up Recommendations Home health PT;Supervision for mobility/OOB    Equipment Recommendations  None recommended by PT    Recommendations for Other Services   NA     Precautions / Restrictions Precautions Precautions: Fall Precaution Comments: mildly unsteady on her feet      Mobility  Bed Mobility Overal bed mobility: Needs Assistance Bed Mobility: Supine to Sit     Supine to sit: Min guard     General bed mobility comments: Min guard assist for safety during transitions.   Transfers Overall transfer level: Needs assistance Equipment used: None Transfers: Sit to/from Stand Sit to Stand: Min assist         General transfer comment: Min assist for support and balance during transitions.   Ambulation/Gait Ambulation/Gait assistance: Min guard Ambulation Distance (Feet): 120 Feet Assistive device: None Gait Pattern/deviations: Step-through pattern Gait velocity: decreased Gait velocity interpretation: Below normal speed for age/gender General Gait Details: Pt with slow, self selected gait pattern, when asked, she could speed up.  One to two small staggers during gait min guard assist for safety and balance.           Balance Overall balance assessment: Needs assistance Sitting-balance support: Feet supported;No upper extremity supported Sitting  balance-Leahy Scale: Good     Standing balance support: No upper extremity supported Standing balance-Leahy Scale: Fair                               Pertinent Vitals/Pain Pain Assessment: No/denies pain    Home Living Family/patient expects to be discharged to:: Private residence Living Arrangements: Parent Available Help at Discharge: Family;Available 24 hours/day Type of Home: House Home Access: Stairs to enter   [employer] of Steps: 2 Home Layout: One level Home Equipment: Walker - 2 wheels;Shower seat;Bedside commode      Prior Function Level of Independence: Independent         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     Hand Dominance   Dominant Hand: Right    Extremity/Trunk Assessment   Upper Extremity Assessment: Defer to OT evaluation           Lower Extremity Assessment: Overall WFL for tasks assessed (5/5 MMT throughout, normal hee-shin, normal sensation)      Cervical / Trunk Assessment: Normal  Communication   Communication: No difficulties  Cognition Arousal/Alertness: Lethargic (we woke her from sleeping) Behavior During Therapy: WFL for tasks assessed/performed Overall Cognitive Status: Within Functional Limits for tasks assessed                               Assessment/Plan    PT Assessment Patient needs continued PT services  PT Diagnosis Difficulty walking;Abnormality  of gait   PT Problem List Decreased activity tolerance;Decreased balance;Decreased mobility;Decreased knowledge of use of DME  PT Treatment Interventions DME instruction;Gait training;Stair training;Functional mobility training;Therapeutic exercise;Therapeutic activities;Balance training;Neuromuscular re-education;Patient/family education   PT Goals (Current goals can be found in the Care Plan section) Acute Rehab PT Goals Patient Stated Goal: to get home  PT Goal Formulation: With patient Time For Goal  Achievement: 07/24/16 Potential to Achieve Goals: Good    Frequenc Min 4X/week        Co-evaluation PT/OT/SLP Co-Evaluation/Treatment: Yes Reason for Co-Treatment: Complexity of the patient's impairments (multi-system involvement) PT goals addressed during session: Mobility/safety with mobility;Balance         End of Session Equipment Utilized During Treatment: Gait belt Activity Tolerance: Patient limited by fatigue Patient left: in chair;with call bell/phone within reach;with chair alarm set Nurse Communication: Mobility status         Time: [id number] PT Time Calculation (min) (ACUTE ONLY): 21 min   Charges:   PT Evaluation $PT Eval Moderate Complexity: 1 Procedure          Mega Kinkade B. [place], [place], DPT [phone]   07/10/2016, 5:00 PM

## 2016-07-11 ENCOUNTER — Encounter (HOSPITAL_COMMUNITY): Payer: Self-pay | Admitting: Neurological Surgery

## 2016-07-11 NOTE — Progress Notes (Signed)
Pt discharged at this this time.  IVs removed.  All questions answered.  Pt will follow up with Dr. Hewitt Shorts office in 2 weeks.  Pt doing fine and her mother is taking her home.  [facility] notified.

## 2016-07-11 NOTE — Progress Notes (Signed)
No acute events Doing well Incision looks good D/c

## 2016-07-11 NOTE — Care Management Note (Signed)
Case Management Note  Patient Details  Name: [name] [name] MRN: [id number] Date of Birth: Nov 01, 1961  Subjective/Objective: Pt admitted on 07/09/16 s/p craniotomy for clipping of MCA aneurysm.  PTA, pt independent, lives with parent. PT/OT recommending HH follow up at dc.                      Action/Plan: Met with pt to discuss home arrangements.  Referral to [facility], per pt choice.  Start of care 24-48h post dc date.  Pt denies any DME needs for home.   Expected Discharge Date:   07/11/16               Expected Discharge Plan:  Early  In-House Referral:     Discharge planning Services  CM Consult  Post Acute Care Choice:  Home Health Choice offered to:  Patient  DME Arranged:    DME Agency:     HH Arranged:  OT, PT [place] Agency:  [place]  Status of Service:  Completed, signed off  If discussed at [place] of Stay Meetings, dates discussed:    Additional Comments:  Reinaldo Raddle, RN, BSN  Trauma/Neuro ICU Case Manager [phone]

## 2016-07-12 NOTE — Discharge Summary (Signed)
Date of Admission: 07/09/2016  Date of Discharge: 07/11/2016  PRE-OPERATIVE DIAGNOSIS: Left MCA aneurysm  POST-OPERATIVE DIAGNOSIS: Same  PROCEDURE: Left pterional craniotomy for clipping of middle cerebral artery aneurysm  Attending: No att. providers found  Hospital Course:  The patient was admitted for the above listed operation and had an uncomplicated post-operative course.  They were discharged in stable condition.  Follow up: 3 weeks    Medication List    TAKE these medications        diphenhydrAMINE 25 MG tablet  Commonly known as:  BENADRYL  Take 1 tablet (25 mg total) by mouth every 4 (four) hours as needed for itching.     docusate sodium 100 MG capsule  Commonly known as:  COLACE  Take 1 capsule (100 mg total) by mouth 2 (two) times daily.     HYDROcodone-acetaminophen 5-325 MG tablet  Commonly known as:  NORCO/VICODIN  Take 1 tablet by mouth every 4 (four) hours as needed for moderate pain.     levETIRAcetam 750 MG tablet  Commonly known as:  KEPPRA  Take 1 tablet (750 mg total) by mouth 2 (two) times daily.     lovastatin 20 MG tablet  Commonly known as:  MEVACOR  Take 20 mg by mouth at bedtime.

## 2016-07-13 ENCOUNTER — Emergency Department (HOSPITAL_COMMUNITY)
Admission: EM | Admit: 2016-07-13 | Discharge: 2016-07-13 | Disposition: A | Discharge status: Home / Self Care | Payer: BLUE CROSS/BLUE SHIELD | Attending: Dermatology | Admitting: Dermatology

## 2016-07-13 ENCOUNTER — Encounter (HOSPITAL_COMMUNITY): Payer: Self-pay | Admitting: Emergency Medicine

## 2016-07-13 DIAGNOSIS — I10 Essential (primary) hypertension: Secondary | ICD-10-CM | POA: Insufficient documentation

## 2016-07-13 DIAGNOSIS — Z5321 Procedure and treatment not carried out due to patient leaving prior to being seen by health care provider: Secondary | ICD-10-CM | POA: Diagnosis not present

## 2016-07-13 DIAGNOSIS — F17200 Nicotine dependence, unspecified, uncomplicated: Secondary | ICD-10-CM | POA: Diagnosis not present

## 2016-07-13 DIAGNOSIS — H578 Other specified disorders of eye and adnexa: Secondary | ICD-10-CM | POA: Diagnosis not present

## 2016-07-13 HISTORY — DX: Cerebral aneurysm, nonruptured: I67.1

## 2016-07-13 NOTE — ED Triage Notes (Signed)
Patient c/o orbital swelling to left eye. Denies any visual changes. Per patient started yesterday. Per patient recently started Docusate 100mg , Levetiracetam 750mg , Hydrocodone 5-325mg  after having "brain" surgery.

## 2016-07-13 NOTE — ED Notes (Signed)
Pt and pt visitor states eye is looking better and pt does not have any pain. Pt states she will leave and return if she feels worse.

## 2016-07-14 LAB — TYPE AND SCREEN
ABO/RH(D): O POS
Antibody Screen: NEGATIVE
Unit division: 0
Unit division: 0

## 2016-10-01 ENCOUNTER — Other Ambulatory Visit: Payer: Self-pay | Admitting: Neurological Surgery

## 2016-10-14 ENCOUNTER — Emergency Department (HOSPITAL_COMMUNITY)
Admission: EM | Admit: 2016-10-14 | Discharge: 2016-10-14 | Disposition: A | Discharge status: Home / Self Care | Payer: Medicaid Other | Attending: Emergency Medicine | Admitting: Emergency Medicine

## 2016-10-14 ENCOUNTER — Encounter (HOSPITAL_COMMUNITY): Payer: Self-pay | Admitting: Emergency Medicine

## 2016-10-14 DIAGNOSIS — I10 Essential (primary) hypertension: Secondary | ICD-10-CM | POA: Insufficient documentation

## 2016-10-14 DIAGNOSIS — F17200 Nicotine dependence, unspecified, uncomplicated: Secondary | ICD-10-CM | POA: Insufficient documentation

## 2016-10-14 DIAGNOSIS — Z79899 Other long term (current) drug therapy: Secondary | ICD-10-CM | POA: Insufficient documentation

## 2016-10-14 DIAGNOSIS — R51 Headache: Secondary | ICD-10-CM | POA: Diagnosis not present

## 2016-10-14 DIAGNOSIS — M19011 Primary osteoarthritis, right shoulder: Secondary | ICD-10-CM | POA: Diagnosis not present

## 2016-10-14 DIAGNOSIS — R002 Palpitations: Secondary | ICD-10-CM | POA: Diagnosis not present

## 2016-10-14 DIAGNOSIS — M25511 Pain in right shoulder: Secondary | ICD-10-CM | POA: Diagnosis present

## 2016-10-14 MED ORDER — TRAMADOL HCL 50 MG PO TABS: 50.0000 mg | ORAL_TABLET | Freq: Four times a day (QID) | ORAL | 0 refills | Status: DC | PRN

## 2016-10-14 MED ORDER — DICLOFENAC SODIUM 3 % TD GEL: TRANSDERMAL | 1 refills | Status: DC

## 2016-10-14 NOTE — Discharge Instructions (Signed)
Please apply diclofenac gel to the right shoulder 2 times daily. Heating pad to the shoulder may be helpful. Use ultram for pain if needed.This medication may cause drowsiness. Please do not drink, drive, or participate in activity that requires concentration while taking this medication. Please see Dr Aline Brochure for orthopedic evaluation and management. Use sling for the next 4 or 5 days.

## 2016-10-14 NOTE — ED Triage Notes (Signed)
Pt reports right shoulder pain x1week with no injury noted.  Pt has full rom but pain with movement.

## 2016-10-14 NOTE — ED Provider Notes (Signed)
[facility] DEPT Provider Note   CSN: [id number] Arrival date & time: 10/14/16  1007     History   Chief Complaint Chief Complaint  Patient presents with  . Shoulder Pain    HPI [name] [name] is a 55 y.o. female.  Pt reports pain and stiffness of the right shoulder from time to time. The problem is worse in the past week. No injury or procedure of the right shoulder. Pt is right hand dominate.   The history is provided by the patient.  Shoulder Pain   The current episode started more than 1 week ago. The problem occurs daily. The problem has been gradually worsening. The pain is present in the right shoulder. The quality of the pain is described as aching. The pain is moderate. Associated symptoms include limited range of motion and stiffness. The symptoms are aggravated by activity. She has tried nothing for the symptoms. The treatment provided no relief. There has been no history of extremity trauma. Family history is significant for no gout.    Past Medical History:  Diagnosis Date  . Brain aneurysm   . Headache   . Hypertension    QUIT TAKING BP MEDS NONE IN 10+ YEARS  . SVT (supraventricular tachycardia) ([place])   . Thyroid disease    PROCEDURE FOR THYROID 15 YRS AGO AT [facility]  . WPW (Wolff-Parkinson-White syndrome)     Patient Active Problem List   Diagnosis Date Noted  . Intracranial aneurysm 07/09/2016  . Communicating hydrocephalus 02/03/2016  . Essential hypertension   . Paroxysmal SVT (supraventricular tachycardia) ([place])   . Paroxysmal atrial fibrillation ([facility])   . Tobacco abuse   . Tachypnea   . Bradycardia   . Leukocytosis   . Acute blood loss anemia   . Thrombocytosis ([place])   . Chest pain   . Nontraumatic subarachnoid hemorrhage ([place])   . Acute respiratory failure ([place])   . Encounter for central line placement   . Hypoxemia   . Renal insufficiency   . Encounter for imaging study to confirm nasogastric (NG) tube placement   . Endotracheally  intubated   . Respiratory failure ([place])   . SAH (subarachnoid hemorrhage) ([place])   . Subarachnoid hemorrhage due to ruptured aneurysm ([place]) 01/03/2016  . Subarachnoid hemorrhage ([place]) 01/03/2016  . Atrial fibrillation with rapid ventricular response ([place])   . Elevated lactic acid level   . SMOKER 03/20/2009  . HYPERTENSION, UNSPECIFIED 03/20/2009  . WPW 03/20/2009  . SVT/ PSVT/ PAT 03/20/2009  . BRONCHITIS 03/20/2009    Past Surgical History:  Procedure Laterality Date  . BREAST SURGERY     BX RT BREAST  BENIGN  . CRANIOTOMY N/A 01/04/2016   Procedure: Suboccipital Craniotomy and Cervical one Laminectomy for Clipping of Aneurysm;  Surgeon: Kevan Ny Ditty, MD;  Location: [facility]UR[facility]S;  Service: Neurosurgery;  Laterality: N/A;  . CRANIOTOMY N/A 07/09/2016   Procedure: Craniotomy for clipping of left middle cerebral artery aneurysm;  Surgeon: Kevan Ny Ditty, MD;  Location: [facility];  Service: Neurosurgery;  Laterality: N/A;  Craniotomy for clipping of left middle cerebral artery aneurysm  . ELECTROPHYSIOLOGIC STUDY    . LAPAROSCOPIC REVISION VENTRICULAR-PERITONEAL (V-P) SHUNT N/A 02/04/2016   Procedure: LAPAROSCOPIC Insertion VENTRICULAR-PERITONEAL (V-P) SHUNT;  Surgeon: Rolm Bookbinder, MD;  Location: [facility] NEURO ORS;  Service: General;  Laterality: N/A;  . RADIOLOGY WITH ANESTHESIA N/A 01/03/2016   Procedure: RADIOLOGY WITH ANESTHESIA;  Surgeon: Medication Radiologist, MD;  Location: [facility];  Service: Radiology;  Laterality:  N/A;  . VENTRICULOPERITONEAL SHUNT Right 02/04/2016   Procedure: SHUNT INSERTION VENTRICULAR-PERITONEAL With Laparoscopic Assistance;  Surgeon: Kevan Ny Ditty, MD;  Location: [place] [facility] [facility]S;  Service: Neurosurgery;  Laterality: Right;    OB History    No data available       Home Medications    Prior to Admission medications   Medication Sig Start Date End Date Taking? Authorizing Provider  diphenhydrAMINE (BENADRYL) 25 MG tablet Take 1 tablet  (25 mg total) by mouth every 4 (four) hours as needed for itching. 09/04/15   Tammy Triplett, PA-C  docusate sodium (COLACE) 100 MG capsule Take 1 capsule (100 mg total) by mouth 2 (two) times daily. 07/10/16   Kevan Ny Ditty, MD  HYDROcodone-acetaminophen (NORCO/VICODIN) 5-325 MG tablet Take 1 tablet by mouth every 4 (four) hours as needed for moderate pain. 07/10/16   Kevan Ny Ditty, MD  levETIRAcetam (KEPPRA) 750 MG tablet Take 1 tablet (750 mg total) by mouth 2 (two) times daily. 07/10/16   Kevan Ny Ditty, MD  lovastatin (MEVACOR) 20 MG tablet Take 20 mg by mouth at bedtime.    Historical Provider, MD    Family History History reviewed. No pertinent family history.  Social History Social History  Substance Use Topics  . Smoking status: Current Every Day Smoker    Packs/day: 1.00  . Smokeless tobacco: Not on file  . Alcohol use No     Allergies   No known allergies   Review of Systems Review of Systems  Cardiovascular: Positive for palpitations.  Musculoskeletal: Positive for arthralgias and stiffness.  Neurological: Positive for headaches.  All other systems reviewed and are negative.    Physical Exam Updated Vital Signs BP 100/73 (BP Location: Left Arm)   Pulse 82   Temp 98 F (36.7 C) (Oral)   Resp 18   SpO2 100%   Physical Exam  Constitutional: She is oriented to person, place, and time. She appears well-developed and well-nourished.  Non-toxic appearance.  HENT:  Head: Normocephalic.  Right Ear: Tympanic membrane and external ear normal.  Left Ear: Tympanic membrane and external ear normal.  Eyes: EOM and lids are normal. Pupils are equal, round, and reactive to light.  Neck: Normal range of motion. Neck supple. Carotid bruit is not present.  Cardiovascular: Normal rate, regular rhythm, normal heart sounds, intact distal pulses and normal pulses.   Pulmonary/Chest: Breath sounds normal. No respiratory distress.  Abdominal: Soft. Bowel sounds  are normal. There is no tenderness. There is no guarding.  Musculoskeletal:       Right shoulder: She exhibits decreased range of motion, tenderness and crepitus. She exhibits no effusion, no deformity, normal pulse and normal strength.  Lymphadenopathy:       Head (right side): No submandibular adenopathy present.       Head (left side): No submandibular adenopathy present.    She has no cervical adenopathy.  Neurological: She is alert and oriented to person, place, and time. She has normal strength. No cranial nerve deficit or sensory deficit.  Skin: Skin is warm and dry.  Psychiatric: She has a normal mood and affect. Her speech is normal.  Nursing note and vitals reviewed.    ED Treatments / Results  Labs (all labs ordered are listed, but only abnormal results are displayed) Labs Reviewed - No data to display  EKG  EKG Interpretation None       Radiology No results found.  Procedures Procedures (including critical care time)  Medications Ordered in  ED Medications - No data to display   Initial Impression / Assessment and Plan / ED Course  I have reviewed the triage vital signs and the nursing notes.  Pertinent labs & imaging results that were available during my care of the patient were reviewed by me and considered in my medical decision making (see chart for details).  Clinical Course    **I have reviewed nursing notes, vital signs, and all appropriate lab and imaging results for this patient.*  Final Clinical Impressions(s) / ED Diagnoses  Vital signs wnl. Pt has pain and crepitus with ROM of the right shoulder. No dislocation. No hot joint. Distal circulation is wnl. Pt will be treated with a sling and Diclofenac Rub, and ultram. Pt referred to Dr Aline Brochure.   Final diagnoses:  None    New Prescriptions New Prescriptions   No medications on file     Lily Kocher, PA-C 10/14/16 [place], MD 10/14/16 1432

## 2016-10-14 NOTE — ED Notes (Signed)
Pt made aware to return if symptoms worsen or if any life threatening symptoms occur.   

## 2016-10-17 NOTE — Pre-Procedure Instructions (Signed)
DOROTHYANN LEATHERMAN  10/17/2016      [id number] - [place], [place] - [address] AT [address] [place] [address] Phone: [phone] Fax: [phone]  [id number] - [place], [place] [address] [place] [address] Phone: [phone] Fax: [phone]    Your procedure is scheduled on Nov 3.  Report to [facility] Admitting at 530  A.M.  Call this number if you have problems the morning of surgery:  [phone]   Remember:  Do not eat food or drink liquids after midnight.  Take these medicines the morning of surgery with A SIP OF WATER Tylenol if needed and Tramadol (Ultram)  Stop taking aspirin, Ibuprofen, Advil, Motrin, BC's, Goody's, Herbal medications, Fish oil, Vitamins, Aleve   Do not wear jewelry, make-up or nail polish.  Do not wear lotions, powders, or perfumes, or deoderant.  Do not shave 48 hours prior to surgery.  Men may shave face and neck.  Do not bring valuables to the hospital.  [facility] is not responsible for any belongings or valuables.  Contacts, dentures or bridgework may not be worn into surgery.  Leave your suitcase in the car.  After surgery it may be brought to your room.  For patients admitted to the hospital, discharge time will be determined by your treatment team.  Patients discharged the day of surgery will not be allowed to drive home.   Special instructions:  [facility] - Preparing for Surgery  Before surgery, you can play an important role.  Because skin is not sterile, your skin needs to be as free of germs as possible.  You can reduce the number of germs on you skin by washing with CHG (chlorahexidine gluconate) soap before surgery.  CHG is an antiseptic cleaner which kills germs and bonds with the skin to continue killing germs even after washing.  Please DO NOT use if you have an allergy to CHG or  antibacterial soaps.  If your skin becomes reddened/irritated stop using the CHG and inform your nurse when you arrive at Short Stay.  Do not shave (including legs and underarms) for at least 48 hours prior to the first CHG shower.  You may shave your face.  Please follow these instructions carefully:   1.  Shower with CHG Soap the night before surgery and the    morning of Surgery.  2.  If you choose to wash your hair, wash your hair first as usual with your  normal shampoo.  3.  After you shampoo, rinse your hair and body thoroughly to remove the  Shampoo.  4.  Use CHG as you would any other liquid soap.  You can apply chg directly  to the skin and wash gently with scrungie or a clean washcloth.  5.  Apply the CHG Soap to your body ONLY FROM THE NECK DOWN.   Do not use on open wounds or open sores.  Avoid contact with your eyes,       ears, mouth and genitals (private parts).  Wash genitals (private parts) with your normal soap.  6.  Wash thoroughly, paying special attention to the area where your surgery   will be performed.  7.  Thoroughly rinse your body with warm water from the neck down.  8.  DO NOT shower/wash with your normal soap after using and rinsing off  the CHG Soap.  9.  Pat yourself dry with a clean towel.            10.  Wear clean pajamas.            11.  Place clean sheets on your bed the night of your first shower and do not   sleep with pets.  Day of Surgery  Do not apply any lotions/deoderants the morning of surgery.  Please wear clean clothes to the hospital/surgery center.     Please read over the following fact sheets that you were given. Pain Booklet and Surgical Site Infection Prevention

## 2016-10-20 ENCOUNTER — Encounter (HOSPITAL_COMMUNITY)
Admission: RE | Admit: 2016-10-20 | Discharge: 2016-10-20 | Disposition: A | Discharge status: Home / Self Care | Payer: Medicaid Other | Source: Ambulatory Visit | Attending: Neurological Surgery | Admitting: Neurological Surgery

## 2016-10-20 ENCOUNTER — Encounter (HOSPITAL_COMMUNITY): Payer: Self-pay

## 2016-10-20 DIAGNOSIS — I671 Cerebral aneurysm, nonruptured: Secondary | ICD-10-CM | POA: Diagnosis not present

## 2016-10-20 DIAGNOSIS — Z01812 Encounter for preprocedural laboratory examination: Secondary | ICD-10-CM | POA: Diagnosis present

## 2016-10-20 DIAGNOSIS — I10 Essential (primary) hypertension: Secondary | ICD-10-CM | POA: Diagnosis not present

## 2016-10-20 HISTORY — DX: Unspecified osteoarthritis, unspecified site: M19.90

## 2016-10-20 LAB — CBC
HCT: 43.2 % (ref 36.0–46.0)
Hemoglobin: 13.7 g/dL (ref 12.0–15.0)
MCH: 27.4 pg (ref 26.0–34.0)
MCHC: 31.7 g/dL (ref 30.0–36.0)
MCV: 86.4 fL (ref 78.0–100.0)
Platelets: 252 10*3/uL (ref 150–400)
RBC: 5 MIL/uL (ref 3.87–5.11)
RDW: 14.8 % (ref 11.5–15.5)
WBC: 6.1 10*3/uL (ref 4.0–10.5)

## 2016-10-20 LAB — BASIC METABOLIC PANEL
Anion gap: 6 (ref 5–15)
BUN: 13 mg/dL (ref 6–20)
CO2: 26 mmol/L (ref 22–32)
Calcium: 9.6 mg/dL (ref 8.9–10.3)
Chloride: 110 mmol/L (ref 101–111)
Creatinine, Ser: 1.49 mg/dL — ABNORMAL HIGH (ref 0.44–1.00)
GFR calc Af Amer: 45 mL/min — ABNORMAL LOW (ref >60)
GFR calc non Af Amer: 38 mL/min — ABNORMAL LOW (ref >60)
Glucose, Bld: 86 mg/dL (ref 65–99)
Potassium: 4.1 mmol/L (ref 3.5–5.1)
Sodium: 142 mmol/L (ref 135–145)

## 2016-10-20 NOTE — Progress Notes (Addendum)
States she doesn't have a PCP, but plans to go see Dr Criss Rosales States she saw a cardiologist many years ago, Thinks the Dr's name was Dr Maryann Conners, but states she is not sure. Echo noted form 01-14-16 EKG noted from 02-03-16 Instructed pt not to smoke on the day of surgery. States she had a rash with blood over 15 years ago, but no other problems with blood Denies any chest pain.

## 2016-10-21 ENCOUNTER — Encounter (HOSPITAL_COMMUNITY): Payer: Self-pay

## 2016-10-21 NOTE — Progress Notes (Addendum)
Anesthesia chart review: Patient is a 55 year old female scheduled for right orbitozygomatic craniotomy for clipping of basilar tip aneurysm on 10/24/2016 by Dr. Cyndy Freeze.   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 BMI is consistent with obesity.  Reports she does not currently have a PCP, but is planning to get established with Dr. Criss Rosales. She is not routinely followed by a cardiologist, but saw Dr. Cristopher Peru in 2010 s/p AVNRT ablation.   Medications include: lovastatin, tramadol.  BP (!) 141/92   Pulse 82   Temp 36.7 C   Resp 18   Ht 5\' 3"  (1.6 m)   Wt 197 lb 4.8 oz (89.5 kg)   SpO2 95%   BMI 34.95 kg/m   EKG 02/03/16: Sinus rhythm. Inferior infarct, old. Probable anterolateral infarct, old  Echo 01/14/16:  - Left ventricle: The cavity size was normal. Systolic function was vigorous. The estimated ejection fraction was in the range of 65% to 70%. Wall motion was normal; there were no regional wall motion abnormalities. Left ventricular diastolic function parameters were normal. - Aortic valve: Transvalvular velocity was within the normal range. There was no stenosis. There was no regurgitation. - Mitral valve: Transvalvular velocity was within the normal range. There was no evidence for stenosis. There was no regurgitation. - Left atrium: The atrium was moderately dilated. - Right ventricle: The cavity size was normal. Wall thickness was normal. Systolic function was normal. - Atrial septum: No defect or patent foramen ovale was identified by color flow Doppler. - Tricuspid valve: There was trivial regurgitation. -  Inferior vena cava: The vessel was normal in size. The respirophasic diameter changes were in the normal range (>= 50%), consistent with normal central venous pressure.  1V CXR 07/09/16 (following CVL placement): FINDINGS: - Right jugular central venous catheter with the tip projecting over the cavoatrial junction. Left jugular central venous catheter with the tip projecting at the compliments of the left brachycephalic vein and SVC. - There is no focal parenchymal opacity. There is no pleural effusion or pneumothorax. The heart and mediastinal contours are unremarkable. - The osseous structures are unremarkable.  Preoperative labs noted. BUN 13, Cr 1.49 (last Cr 1.23 on 07/10/16). Glucose 86. CBC WNL. T&S done. Cr should be monitored post-operatively. Defer decision for post-operative Hospitalist consult to surgeon (could depend on lab results and/or when she is scheduled to get established with Dr. Criss Rosales.)   She has undergone multiple neurosurgical procedures since her [facility] in January. If no acute changes then I anticipate that she can proceed as planned.  George Hugh [facility] Short Stay Center/Anesthesiology Phone [phone] 10/21/2016 12:24 PM

## 2016-10-23 ENCOUNTER — Encounter (HOSPITAL_COMMUNITY): Payer: Self-pay | Admitting: Certified Registered Nurse Anesthetist

## 2016-10-24 ENCOUNTER — Encounter (HOSPITAL_COMMUNITY)
Admission: RE | Disposition: A | Discharge status: Rehabilitation Facility | Payer: Self-pay | Source: Ambulatory Visit | Attending: Neurological Surgery

## 2016-10-24 ENCOUNTER — Inpatient Hospital Stay (HOSPITAL_COMMUNITY): Payer: Medicaid Other | Admitting: Certified Registered"

## 2016-10-24 ENCOUNTER — Inpatient Hospital Stay (HOSPITAL_COMMUNITY)
Admission: RE | Admit: 2016-10-24 | Discharge: 2016-10-29 | DRG: 026 | Disposition: A | Discharge status: Rehabilitation Facility | Payer: Medicaid Other | Source: Ambulatory Visit | Attending: Neurological Surgery | Admitting: Neurological Surgery

## 2016-10-24 ENCOUNTER — Inpatient Hospital Stay (HOSPITAL_COMMUNITY): Payer: Medicaid Other | Admitting: Vascular Surgery

## 2016-10-24 ENCOUNTER — Encounter (HOSPITAL_COMMUNITY): Payer: Self-pay | Admitting: *Deleted

## 2016-10-24 DIAGNOSIS — I10 Essential (primary) hypertension: Secondary | ICD-10-CM | POA: Diagnosis present

## 2016-10-24 DIAGNOSIS — D62 Acute posthemorrhagic anemia: Secondary | ICD-10-CM | POA: Diagnosis not present

## 2016-10-24 DIAGNOSIS — M6281 Muscle weakness (generalized): Secondary | ICD-10-CM

## 2016-10-24 DIAGNOSIS — Z79899 Other long term (current) drug therapy: Secondary | ICD-10-CM

## 2016-10-24 DIAGNOSIS — D72829 Elevated white blood cell count, unspecified: Secondary | ICD-10-CM | POA: Diagnosis not present

## 2016-10-24 DIAGNOSIS — I952 Hypotension due to drugs: Secondary | ICD-10-CM | POA: Diagnosis not present

## 2016-10-24 DIAGNOSIS — G936 Cerebral edema: Secondary | ICD-10-CM | POA: Diagnosis not present

## 2016-10-24 DIAGNOSIS — N179 Acute kidney failure, unspecified: Secondary | ICD-10-CM | POA: Diagnosis not present

## 2016-10-24 DIAGNOSIS — I456 Pre-excitation syndrome: Secondary | ICD-10-CM | POA: Diagnosis present

## 2016-10-24 DIAGNOSIS — I609 Nontraumatic subarachnoid hemorrhage, unspecified: Secondary | ICD-10-CM | POA: Diagnosis not present

## 2016-10-24 DIAGNOSIS — I729 Aneurysm of unspecified site: Secondary | ICD-10-CM

## 2016-10-24 DIAGNOSIS — I63411 Cerebral infarction due to embolism of right middle cerebral artery: Secondary | ICD-10-CM | POA: Diagnosis not present

## 2016-10-24 DIAGNOSIS — G441 Vascular headache, not elsewhere classified: Secondary | ICD-10-CM

## 2016-10-24 DIAGNOSIS — IMO0002 Reserved for concepts with insufficient information to code with codable children: Secondary | ICD-10-CM

## 2016-10-24 DIAGNOSIS — Z982 Presence of cerebrospinal fluid drainage device: Secondary | ICD-10-CM | POA: Diagnosis not present

## 2016-10-24 DIAGNOSIS — R29818 Other symptoms and signs involving the nervous system: Secondary | ICD-10-CM | POA: Diagnosis not present

## 2016-10-24 DIAGNOSIS — G8194 Hemiplegia, unspecified affecting left nondominant side: Secondary | ICD-10-CM | POA: Diagnosis present

## 2016-10-24 DIAGNOSIS — I63311 Cerebral infarction due to thrombosis of right middle cerebral artery: Secondary | ICD-10-CM | POA: Diagnosis not present

## 2016-10-24 DIAGNOSIS — H4901 Third [oculomotor] nerve palsy, right eye: Secondary | ICD-10-CM | POA: Diagnosis present

## 2016-10-24 DIAGNOSIS — T462X5A Adverse effect of other antidysrhythmic drugs, initial encounter: Secondary | ICD-10-CM | POA: Diagnosis not present

## 2016-10-24 DIAGNOSIS — F17210 Nicotine dependence, cigarettes, uncomplicated: Secondary | ICD-10-CM | POA: Diagnosis present

## 2016-10-24 DIAGNOSIS — R5383 Other fatigue: Secondary | ICD-10-CM

## 2016-10-24 DIAGNOSIS — R001 Bradycardia, unspecified: Secondary | ICD-10-CM | POA: Diagnosis not present

## 2016-10-24 DIAGNOSIS — Y92234 Operating room of hospital as the place of occurrence of the external cause: Secondary | ICD-10-CM | POA: Diagnosis not present

## 2016-10-24 DIAGNOSIS — I69319 Unspecified symptoms and signs involving cognitive functions following cerebral infarction: Secondary | ICD-10-CM | POA: Diagnosis not present

## 2016-10-24 DIAGNOSIS — I158 Other secondary hypertension: Secondary | ICD-10-CM

## 2016-10-24 DIAGNOSIS — D72820 Lymphocytosis (symptomatic): Secondary | ICD-10-CM

## 2016-10-24 DIAGNOSIS — R414 Neurologic neglect syndrome: Secondary | ICD-10-CM | POA: Diagnosis not present

## 2016-10-24 DIAGNOSIS — I671 Cerebral aneurysm, nonruptured: Secondary | ICD-10-CM | POA: Diagnosis present

## 2016-10-24 DIAGNOSIS — E876 Hypokalemia: Secondary | ICD-10-CM

## 2016-10-24 DIAGNOSIS — Z8679 Personal history of other diseases of the circulatory system: Secondary | ICD-10-CM

## 2016-10-24 DIAGNOSIS — Z298 Encounter for other specified prophylactic measures: Secondary | ICD-10-CM

## 2016-10-24 DIAGNOSIS — I63419 Cerebral infarction due to embolism of unspecified middle cerebral artery: Secondary | ICD-10-CM | POA: Diagnosis not present

## 2016-10-24 DIAGNOSIS — I950 Idiopathic hypotension: Secondary | ICD-10-CM | POA: Diagnosis not present

## 2016-10-24 DIAGNOSIS — I638 Other cerebral infarction: Secondary | ICD-10-CM | POA: Diagnosis not present

## 2016-10-24 HISTORY — PX: CRANIOTOMY: SHX93

## 2016-10-24 LAB — PREPARE RBC (CROSSMATCH)

## 2016-10-24 SURGERY — CRANIOTOMY, FOR VERTEBRAL/BASILAR ARTERY ANEURYSM REPAIR
Anesthesia: General | Laterality: Right

## 2016-10-24 MED ORDER — LIDOCAINE-EPINEPHRINE 2 %-1:100000 IJ SOLN
INTRAMUSCULAR | Status: DC | PRN
  Administered 2016-10-24: 15 mL via INTRADERMAL

## 2016-10-24 MED ORDER — SUGAMMADEX SODIUM 500 MG/5ML IV SOLN
INTRAVENOUS | Status: DC | PRN
  Administered 2016-10-24: 500 mg via INTRAVENOUS

## 2016-10-24 MED ORDER — METOPROLOL TARTRATE 5 MG/5ML IV SOLN
INTRAVENOUS | Status: AC
  Filled 2016-10-24: qty 5

## 2016-10-24 MED ORDER — THROMBIN 5000 UNITS EX SOLR
CUTANEOUS | Status: AC
  Filled 2016-10-24: qty 5000

## 2016-10-24 MED ORDER — ONDANSETRON HCL 4 MG/2ML IJ SOLN
INTRAMUSCULAR | Status: DC | PRN
  Administered 2016-10-24: 4 mg via INTRAVENOUS

## 2016-10-24 MED ORDER — BUPIVACAINE-EPINEPHRINE (PF) 0.5% -1:200000 IJ SOLN
INTRAMUSCULAR | Status: DC | PRN
  Administered 2016-10-24: 15 mL via PERINEURAL

## 2016-10-24 MED ORDER — CEFAZOLIN SODIUM-DEXTROSE 2-4 GM/100ML-% IV SOLN
2.0000 g | Freq: Three times a day (TID) | INTRAVENOUS | Status: AC
  Administered 2016-10-24 – 2016-10-25 (×2): 2 g via INTRAVENOUS
  Filled 2016-10-24 (×2): qty 100

## 2016-10-24 MED ORDER — FENTANYL CITRATE (PF) 100 MCG/2ML IJ SOLN
INTRAMUSCULAR | Status: AC
  Filled 2016-10-24: qty 8

## 2016-10-24 MED ORDER — BACITRACIN ZINC 500 UNIT/GM EX OINT
TOPICAL_OINTMENT | CUTANEOUS | Status: AC
  Filled 2016-10-24: qty 28.35

## 2016-10-24 MED ORDER — LEVETIRACETAM 750 MG PO TABS
750.0000 mg | ORAL_TABLET | Freq: Two times a day (BID) | ORAL | Status: DC
  Administered 2016-10-24 – 2016-10-29 (×10): 750 mg via ORAL
  Filled 2016-10-24 (×10): qty 1

## 2016-10-24 MED ORDER — SODIUM CHLORIDE 0.9 % IJ SOLN
INTRAMUSCULAR | Status: AC
  Filled 2016-10-24: qty 10

## 2016-10-24 MED ORDER — PHENYLEPHRINE HCL 10 MG/ML IJ SOLN
INTRAVENOUS | Status: DC | PRN
  Administered 2016-10-24: 20 ug/min via INTRAVENOUS

## 2016-10-24 MED ORDER — ONDANSETRON HCL 4 MG/2ML IJ SOLN
INTRAMUSCULAR | Status: AC
  Filled 2016-10-24: qty 6

## 2016-10-24 MED ORDER — ESMOLOL HCL 100 MG/10ML IV SOLN
INTRAVENOUS | Status: AC
  Filled 2016-10-24: qty 20

## 2016-10-24 MED ORDER — ARTIFICIAL TEARS OP OINT
TOPICAL_OINTMENT | OPHTHALMIC | Status: AC
  Filled 2016-10-24: qty 7

## 2016-10-24 MED ORDER — OXYCODONE HCL 5 MG/5ML PO SOLN: 5.0000 mg | Freq: Once | ORAL | Status: DC | PRN

## 2016-10-24 MED ORDER — ONDANSETRON HCL 4 MG PO TABS: 4.0000 mg | ORAL_TABLET | ORAL | Status: DC | PRN

## 2016-10-24 MED ORDER — THROMBIN 20000 UNITS EX SOLR
CUTANEOUS | Status: DC | PRN
  Administered 2016-10-24 (×2): via TOPICAL

## 2016-10-24 MED ORDER — LABETALOL HCL 5 MG/ML IV SOLN
10.0000 mg | INTRAVENOUS | Status: AC | PRN
  Administered 2016-10-24 (×2): 10 mg via INTRAVENOUS

## 2016-10-24 MED ORDER — THROMBIN 20000 UNITS EX SOLR
CUTANEOUS | Status: AC
  Filled 2016-10-24: qty 20000

## 2016-10-24 MED ORDER — HYDRALAZINE HCL 20 MG/ML IJ SOLN
5.0000 mg | INTRAMUSCULAR | Status: DC | PRN
  Administered 2016-10-24 – 2016-10-26 (×2): 5 mg via INTRAVENOUS
  Filled 2016-10-24 (×2): qty 1

## 2016-10-24 MED ORDER — LIDOCAINE HCL (CARDIAC) 20 MG/ML IV SOLN
INTRAVENOUS | Status: DC | PRN
  Administered 2016-10-24: 60 mg via INTRAVENOUS

## 2016-10-24 MED ORDER — HYDROCODONE-ACETAMINOPHEN 5-325 MG PO TABS
1.0000 | ORAL_TABLET | ORAL | Status: DC | PRN
  Administered 2016-10-24 – 2016-10-26 (×3): 1 via ORAL
  Filled 2016-10-24 (×3): qty 1

## 2016-10-24 MED ORDER — DOCUSATE SODIUM 100 MG PO CAPS
100.0000 mg | ORAL_CAPSULE | Freq: Two times a day (BID) | ORAL | Status: DC
  Administered 2016-10-24 – 2016-10-29 (×10): 100 mg via ORAL
  Filled 2016-10-24 (×10): qty 1

## 2016-10-24 MED ORDER — NALOXONE HCL 0.4 MG/ML IJ SOLN: 0.0800 mg | INTRAMUSCULAR | Status: DC | PRN

## 2016-10-24 MED ORDER — PRAVASTATIN SODIUM 20 MG PO TABS
20.0000 mg | ORAL_TABLET | Freq: Every day | ORAL | Status: DC
  Administered 2016-10-24 – 2016-10-28 (×5): 20 mg via ORAL
  Filled 2016-10-24 (×5): qty 1

## 2016-10-24 MED ORDER — OXYCODONE HCL 5 MG PO TABS: 5.0000 mg | ORAL_TABLET | Freq: Once | ORAL | Status: DC | PRN

## 2016-10-24 MED ORDER — FUROSEMIDE 10 MG/ML IJ SOLN
INTRAMUSCULAR | Status: DC | PRN
  Administered 2016-10-24: 10 mg via INTRAMUSCULAR

## 2016-10-24 MED ORDER — SENNA 8.6 MG PO TABS
1.0000 | ORAL_TABLET | Freq: Two times a day (BID) | ORAL | Status: DC
  Administered 2016-10-24 – 2016-10-29 (×10): 8.6 mg via ORAL
  Filled 2016-10-24 (×10): qty 1

## 2016-10-24 MED ORDER — SODIUM CHLORIDE 0.9 % IV SOLN
1000.0000 mg | INTRAVENOUS | Status: AC
  Administered 2016-10-24: 1000 mg via INTRAVENOUS
  Filled 2016-10-24: qty 10

## 2016-10-24 MED ORDER — SODIUM CHLORIDE 0.9 % IV SOLN
Freq: Once | INTRAVENOUS | Status: AC
  Administered 2016-10-24: 07:00:00 via INTRAVENOUS

## 2016-10-24 MED ORDER — LABETALOL HCL 5 MG/ML IV SOLN
INTRAVENOUS | Status: AC
  Administered 2016-10-24: 10 mg via INTRAVENOUS
  Filled 2016-10-24: qty 4

## 2016-10-24 MED ORDER — MANNITOL 20 % IV SOLN
INTRAVENOUS | Status: DC | PRN
  Administered 2016-10-24: 100 g/h via INTRAVENOUS

## 2016-10-24 MED ORDER — EPHEDRINE 5 MG/ML INJ
INTRAVENOUS | Status: AC
  Filled 2016-10-24: qty 10

## 2016-10-24 MED ORDER — ESMOLOL HCL 100 MG/10ML IV SOLN
INTRAVENOUS | Status: DC | PRN
  Administered 2016-10-24 (×2): 40 mg via INTRAVENOUS

## 2016-10-24 MED ORDER — CHLORHEXIDINE GLUCONATE CLOTH 2 % EX PADS: 6.0000 | MEDICATED_PAD | Freq: Once | CUTANEOUS | Status: DC

## 2016-10-24 MED ORDER — MIDAZOLAM HCL 2 MG/2ML IJ SOLN
INTRAMUSCULAR | Status: AC
  Filled 2016-10-24: qty 2

## 2016-10-24 MED ORDER — LABETALOL HCL 5 MG/ML IV SOLN
INTRAVENOUS | Status: DC | PRN
  Administered 2016-10-24 (×4): 5 mg via INTRAVENOUS

## 2016-10-24 MED ORDER — SODIUM CHLORIDE 0.9 % IV SOLN
INTRAVENOUS | Status: DC | PRN
  Administered 2016-10-24: .1 ug/kg/min via INTRAVENOUS

## 2016-10-24 MED ORDER — HYDROMORPHONE HCL 1 MG/ML IJ SOLN
INTRAMUSCULAR | Status: AC
  Filled 2016-10-24: qty 1

## 2016-10-24 MED ORDER — SUCCINYLCHOLINE CHLORIDE 200 MG/10ML IV SOSY
PREFILLED_SYRINGE | INTRAVENOUS | Status: AC
  Filled 2016-10-24: qty 20

## 2016-10-24 MED ORDER — ROCURONIUM BROMIDE 10 MG/ML (PF) SYRINGE
PREFILLED_SYRINGE | INTRAVENOUS | Status: AC
  Filled 2016-10-24: qty 10

## 2016-10-24 MED ORDER — MIDAZOLAM HCL 5 MG/5ML IJ SOLN
INTRAMUSCULAR | Status: DC | PRN
  Administered 2016-10-24: 2 mg via INTRAVENOUS

## 2016-10-24 MED ORDER — FENTANYL CITRATE (PF) 100 MCG/2ML IJ SOLN: 25.0000 ug | INTRAMUSCULAR | Status: DC | PRN

## 2016-10-24 MED ORDER — HEMOSTATIC AGENTS (NO CHARGE) OPTIME
TOPICAL | Status: DC | PRN
  Administered 2016-10-24 (×2): 1 via TOPICAL

## 2016-10-24 MED ORDER — ACETAMINOPHEN 500 MG PO TABS: 1000.0000 mg | ORAL_TABLET | Freq: Every day | ORAL | Status: DC | PRN

## 2016-10-24 MED ORDER — PANTOPRAZOLE SODIUM 20 MG PO TBEC
20.0000 mg | DELAYED_RELEASE_TABLET | Freq: Every day | ORAL | Status: DC
  Administered 2016-10-25 – 2016-10-29 (×5): 20 mg via ORAL
  Filled 2016-10-24 (×6): qty 1

## 2016-10-24 MED ORDER — PHENYLEPHRINE 40 MCG/ML (10ML) SYRINGE FOR IV PUSH (FOR BLOOD PRESSURE SUPPORT)
PREFILLED_SYRINGE | INTRAVENOUS | Status: AC
  Filled 2016-10-24: qty 20

## 2016-10-24 MED ORDER — LIDOCAINE 2% (20 MG/ML) 5 ML SYRINGE
INTRAMUSCULAR | Status: AC
  Filled 2016-10-24: qty 10

## 2016-10-24 MED ORDER — SUGAMMADEX SODIUM 500 MG/5ML IV SOLN
INTRAVENOUS | Status: AC
  Filled 2016-10-24: qty 5

## 2016-10-24 MED ORDER — DEXAMETHASONE SODIUM PHOSPHATE 4 MG/ML IJ SOLN
4.0000 mg | Freq: Four times a day (QID) | INTRAMUSCULAR | Status: DC
Start: 2016-10-24 — End: 2016-10-28
  Administered 2016-10-24 – 2016-10-28 (×16): 4 mg via INTRAVENOUS
  Filled 2016-10-24 (×16): qty 1

## 2016-10-24 MED ORDER — GELATIN ABSORBABLE MT POWD
OROMUCOSAL | Status: DC | PRN
  Administered 2016-10-24: 08:00:00 via TOPICAL

## 2016-10-24 MED ORDER — ONDANSETRON HCL 4 MG/2ML IJ SOLN: 4.0000 mg | INTRAMUSCULAR | Status: DC | PRN

## 2016-10-24 MED ORDER — SODIUM CHLORIDE 0.9 % IV SOLN
0.0500 ug/kg/min | INTRAVENOUS | Status: DC
  Filled 2016-10-24: qty 5000

## 2016-10-24 MED ORDER — ROCURONIUM BROMIDE 100 MG/10ML IV SOLN
INTRAVENOUS | Status: DC | PRN
  Administered 2016-10-24 (×3): 50 mg via INTRAVENOUS
  Administered 2016-10-24: 100 mg via INTRAVENOUS
  Administered 2016-10-24: 50 mg via INTRAVENOUS

## 2016-10-24 MED ORDER — LIDOCAINE-EPINEPHRINE 2 %-1:100000 IJ SOLN
INTRAMUSCULAR | Status: AC
  Filled 2016-10-24: qty 1

## 2016-10-24 MED ORDER — DEXAMETHASONE SODIUM PHOSPHATE 4 MG/ML IJ SOLN
INTRAMUSCULAR | Status: DC | PRN
  Administered 2016-10-24: 10 mg via INTRAVENOUS

## 2016-10-24 MED ORDER — INDOCYANINE GREEN 25 MG IV SOLR
1.2500 mg | Freq: Once | INTRAVENOUS | Status: AC
  Administered 2016-10-24: 25 mg via INTRAVENOUS
  Filled 2016-10-24: qty 25

## 2016-10-24 MED ORDER — BACITRACIN ZINC 500 UNIT/GM EX OINT
TOPICAL_OINTMENT | CUTANEOUS | Status: DC | PRN
  Administered 2016-10-24: 1 via TOPICAL

## 2016-10-24 MED ORDER — FLEET ENEMA 7-19 GM/118ML RE ENEM: 1.0000 | ENEMA | Freq: Once | RECTAL | Status: DC | PRN

## 2016-10-24 MED ORDER — 0.9 % SODIUM CHLORIDE (POUR BTL) OPTIME
TOPICAL | Status: DC | PRN
  Administered 2016-10-24 (×3): 1000 mL

## 2016-10-24 MED ORDER — PROPOFOL 10 MG/ML IV BOLUS
INTRAVENOUS | Status: AC
  Filled 2016-10-24: qty 40

## 2016-10-24 MED ORDER — CEFAZOLIN SODIUM-DEXTROSE 2-4 GM/100ML-% IV SOLN
2.0000 g | INTRAVENOUS | Status: AC
  Administered 2016-10-24 (×2): 2 g via INTRAVENOUS
  Filled 2016-10-24: qty 100

## 2016-10-24 MED ORDER — SODIUM CHLORIDE 0.9 % IR SOLN
Status: DC | PRN
  Administered 2016-10-24: 08:00:00

## 2016-10-24 MED ORDER — DEXAMETHASONE SODIUM PHOSPHATE 10 MG/ML IJ SOLN
INTRAMUSCULAR | Status: AC
  Filled 2016-10-24: qty 3

## 2016-10-24 MED ORDER — SODIUM CHLORIDE 0.9 % IV SOLN
INTRAVENOUS | Status: DC | PRN
  Administered 2016-10-24 (×3): via INTRAVENOUS

## 2016-10-24 MED ORDER — SODIUM CHLORIDE 0.9 % IV SOLN
INTRAVENOUS | Status: DC
  Administered 2016-10-24: 100 mL/h via INTRAVENOUS
  Administered 2016-10-24 – 2016-10-26 (×2): via INTRAVENOUS
  Administered 2016-10-27 – 2016-10-28 (×2): 1000 mL via INTRAVENOUS
  Administered 2016-10-28: 23:00:00 via INTRAVENOUS

## 2016-10-24 MED ORDER — BISACODYL 5 MG PO TBEC: 5.0000 mg | DELAYED_RELEASE_TABLET | Freq: Every day | ORAL | Status: DC | PRN

## 2016-10-24 MED ORDER — FENTANYL CITRATE (PF) 100 MCG/2ML IJ SOLN
INTRAMUSCULAR | Status: DC | PRN
  Administered 2016-10-24: 200 ug via INTRAVENOUS
  Administered 2016-10-24 (×2): 100 ug via INTRAVENOUS

## 2016-10-24 MED ORDER — FENTANYL CITRATE (PF) 100 MCG/2ML IJ SOLN
INTRAMUSCULAR | Status: AC
  Filled 2016-10-24: qty 2

## 2016-10-24 MED ORDER — REMIFENTANIL HCL 1 MG IV SOLR
0.0500 ug/kg/min | INTRAVENOUS | Status: DC
  Filled 2016-10-24: qty 5000

## 2016-10-24 MED ORDER — ADENOSINE 6 MG/2ML IV SOLN
24.0000 mg | Freq: Once | INTRAVENOUS | Status: AC
  Administered 2016-10-24: 24 mg via INTRAVENOUS
  Filled 2016-10-24: qty 8

## 2016-10-24 MED ORDER — PROPOFOL 10 MG/ML IV BOLUS
INTRAVENOUS | Status: DC | PRN
  Administered 2016-10-24: 120 mg via INTRAVENOUS
  Administered 2016-10-24: 200 mg via INTRAVENOUS

## 2016-10-24 MED ORDER — HYDROMORPHONE HCL 1 MG/ML IJ SOLN
0.5000 mg | INTRAMUSCULAR | Status: DC | PRN
  Administered 2016-10-24 – 2016-10-25 (×3): 1 mg via INTRAVENOUS
  Filled 2016-10-24 (×3): qty 1

## 2016-10-24 MED ORDER — PROPOFOL 10 MG/ML IV BOLUS
INTRAVENOUS | Status: AC
  Filled 2016-10-24: qty 20

## 2016-10-24 MED ORDER — BUPIVACAINE-EPINEPHRINE (PF) 0.5% -1:200000 IJ SOLN
INTRAMUSCULAR | Status: AC
  Filled 2016-10-24: qty 30

## 2016-10-24 SURGICAL SUPPLY — 105 items
APL SKNCLS STERI-STRIP NONHPOA (GAUZE/BANDAGES/DRESSINGS)
BAG DECANTER FOR FLEXI CONT (MISCELLANEOUS) ×2 IMPLANT
BANDAGE GAUZE 4  KLING STR (GAUZE/BANDAGES/DRESSINGS) ×1 IMPLANT
BATTERY IQ STERILE (MISCELLANEOUS) ×2 IMPLANT
BENZOIN TINCTURE PRP APPL 2/3 (GAUZE/BANDAGES/DRESSINGS) IMPLANT
BLADE CLIPPER SURG (BLADE) ×2 IMPLANT
BLADE ULTRA TIP 2M (BLADE) ×1 IMPLANT
BNDG GAUZE ELAST 4 BULKY (GAUZE/BANDAGES/DRESSINGS) ×1 IMPLANT
BTRY SRG DRVR 1.5 IQ (MISCELLANEOUS) ×1
BUR ACORN 6.0 PRECISION (BURR) ×2 IMPLANT
BUR MATCHSTICK NEURO 3.0 LAGG (BURR) ×2 IMPLANT
BUR SPIRAL ROUTER 2.3 (BUR) ×1 IMPLANT
CANISTER SUCT 3000ML PPV (MISCELLANEOUS) ×2 IMPLANT
CATH ROBINSON RED A/P 14FR (CATHETERS) IMPLANT
CHLORAPREP W/TINT 26ML (MISCELLANEOUS) ×2 IMPLANT
CLIP ANEURY PERM TIT 4/10.8 (Clip) ×1 IMPLANT
CLIP ANEURY TI PERM MINI 6.6M (Clip) ×2 IMPLANT
CLIP ANEURY TI PERM STD CVD 8M (Clip) ×2 IMPLANT
CLIP TI MEDIUM 6 (CLIP) IMPLANT
COVER TABLE BACK 60X90 (DRAPES) ×1 IMPLANT
DECANTER SPIKE VIAL GLASS SM (MISCELLANEOUS) ×2 IMPLANT
DRAPE MICROSCOPE LEICA (MISCELLANEOUS) ×2 IMPLANT
DRAPE NEUROLOGICAL W/INCISE (DRAPES) ×2 IMPLANT
DRAPE SHEET LG 3/4 BI-LAMINATE (DRAPES) ×4 IMPLANT
DRAPE SURG 17X23 STRL (DRAPES) ×2 IMPLANT
DRAPE WARM FLUID 44X44 (DRAPE) ×2 IMPLANT
DRSG MEPILEX BORDER 4X12 (GAUZE/BANDAGES/DRESSINGS) ×1 IMPLANT
DRSG MEPILEX BORDER 4X8 (GAUZE/BANDAGES/DRESSINGS) IMPLANT
ELECT REM PT RETURN 9FT ADLT (ELECTROSURGICAL) ×2
ELECTRODE REM PT RTRN 9FT ADLT (ELECTROSURGICAL) ×1 IMPLANT
EVACUATOR 1/8 PVC DRAIN (DRAIN) IMPLANT
EVACUATOR SILICONE 100CC (DRAIN) IMPLANT
FORCEPS BIPOLAR SPETZLER 8 1.0 (NEUROSURGERY SUPPLIES) ×1 IMPLANT
FORMULA INJECT OSTEOVATION 10 (Neurostimulator) ×1 IMPLANT
GAUZE SPONGE 4X4 12PLY STRL (GAUZE/BANDAGES/DRESSINGS) ×2 IMPLANT
GAUZE SPONGE 4X4 16PLY XRAY LF (GAUZE/BANDAGES/DRESSINGS) IMPLANT
GLOVE BIOGEL PI IND STRL 7.5 (GLOVE) ×2 IMPLANT
GLOVE BIOGEL PI IND STRL 8 (GLOVE) ×2 IMPLANT
GLOVE BIOGEL PI INDICATOR 7.5 (GLOVE) ×2
GLOVE BIOGEL PI INDICATOR 8 (GLOVE) ×2
GLOVE ECLIPSE 6.5 STRL STRAW (GLOVE) ×1 IMPLANT
GLOVE ECLIPSE 7.5 STRL STRAW (GLOVE) ×4 IMPLANT
GLOVE EXAM NITRILE LRG STRL (GLOVE) ×2 IMPLANT
GLOVE EXAM NITRILE XL STR (GLOVE) IMPLANT
GLOVE EXAM NITRILE XS STR PU (GLOVE) IMPLANT
GLOVE INDICATOR 7.5 STRL GRN (GLOVE) ×1 IMPLANT
GLOVE SS BIOGEL STRL SZ 7.5 (GLOVE) ×2 IMPLANT
GLOVE SUPERSENSE BIOGEL SZ 7.5 (GLOVE) ×2
GLOVE SURG SS PI 7.0 STRL IVOR (GLOVE) ×6 IMPLANT
GOWN STRL REUS W/ TWL LRG LVL3 (GOWN DISPOSABLE) ×4 IMPLANT
GOWN STRL REUS W/ TWL XL LVL3 (GOWN DISPOSABLE) ×2 IMPLANT
GOWN STRL REUS W/TWL 2XL LVL3 (GOWN DISPOSABLE) ×1 IMPLANT
GOWN STRL REUS W/TWL LRG LVL3 (GOWN DISPOSABLE) ×8
GOWN STRL REUS W/TWL XL LVL3 (GOWN DISPOSABLE) ×4
HEMOSTAT POWDER SURGIFOAM 1G (HEMOSTASIS) ×2 IMPLANT
HEMOSTAT SURGICEL 2X14 (HEMOSTASIS) ×2 IMPLANT
HOOK DURA 1/2IN (MISCELLANEOUS) ×2 IMPLANT
KIT BASIN OR (CUSTOM PROCEDURE TRAY) ×2 IMPLANT
KIT ROOM TURNOVER OR (KITS) ×2 IMPLANT
KNIFE ARACHNOID DISP AM-24-S (MISCELLANEOUS) ×1 IMPLANT
MARKER SKIN DUAL TIP RULER LAB (MISCELLANEOUS) ×1 IMPLANT
NDL HYPO 21X1.5 SAFETY (NEEDLE) ×1 IMPLANT
NEEDLE HYPO 21X1.5 SAFETY (NEEDLE) ×2 IMPLANT
NS IRRIG 1000ML POUR BTL (IV SOLUTION) ×5 IMPLANT
PACK CRANIOTOMY (CUSTOM PROCEDURE TRAY) ×2 IMPLANT
PAD ELECT DEFIB RADIOL ZOLL (MISCELLANEOUS) ×1 IMPLANT
PAD ONESTEP ZOLL R SERIES ADT (MISCELLANEOUS) ×1 IMPLANT
PATTIES SURGICAL .5 X.5 (GAUZE/BANDAGES/DRESSINGS) ×1 IMPLANT
PATTIES SURGICAL .5 X3 (DISPOSABLE) IMPLANT
PATTIES SURGICAL .5X1.5 (GAUZE/BANDAGES/DRESSINGS) ×1 IMPLANT
PATTIES SURGICAL 1/4 X 3 (GAUZE/BANDAGES/DRESSINGS) ×1 IMPLANT
PATTIES SURGICAL 1X1 (DISPOSABLE) IMPLANT
PIN MAYFIELD SKULL DISP (PIN) ×2 IMPLANT
PLATE 1.5  2HOLE LNG NEURO (Plate) ×3 IMPLANT
PLATE 1.5 2HOLE LNG NEURO (Plate) IMPLANT
PROBE FOR NEUROSURGERY (MISCELLANEOUS) ×1 IMPLANT
RUBBERBAND STERILE (MISCELLANEOUS) ×4 IMPLANT
SCREW SELF DRILL HT 1.5/4MM (Screw) ×12 IMPLANT
SPONGE LAP 18X18 X RAY DECT (DISPOSABLE) ×2 IMPLANT
SPONGE NEURO XRAY DETECT 1X3 (DISPOSABLE) IMPLANT
SPONGE SURGIFOAM ABS GEL 100 (HEMOSTASIS) ×2 IMPLANT
SPONGE SURGIFOAM ABS GEL 100C (HEMOSTASIS) ×2 IMPLANT
STAPLER VISISTAT 35W (STAPLE) ×8 IMPLANT
STOCKINETTE 6  STRL (DRAPES) ×2
STOCKINETTE 6 STRL (DRAPES) ×1 IMPLANT
STRIP SURGICAL 1 X 6 IN (GAUZE/BANDAGES/DRESSINGS) ×1 IMPLANT
STRIP SURGICAL 1/2 X 6 IN (GAUZE/BANDAGES/DRESSINGS) ×2 IMPLANT
STRIP SURGICAL 1/4 X 6 IN (GAUZE/BANDAGES/DRESSINGS) ×2 IMPLANT
STRIP SURGICAL 2 X 6 IN (GAUZE/BANDAGES/DRESSINGS) ×2 IMPLANT
STRIP SURGICAL 3/4 X 6 IN (GAUZE/BANDAGES/DRESSINGS) ×2 IMPLANT
SUT ETHILON 3 0 FSL (SUTURE) IMPLANT
SUT ETHILON 3 0 PS 1 (SUTURE) IMPLANT
SUT NURALON 4 0 TR CR/8 (SUTURE) ×4 IMPLANT
SUT STRATAFIX 1PDS 45CM VIOLET (SUTURE) IMPLANT
SUT VIC AB 0 CT1 18XCR BRD8 (SUTURE) IMPLANT
SUT VIC AB 0 CT1 8-18 (SUTURE)
SUT VIC AB 2-0 CT1 18 (SUTURE) ×6 IMPLANT
SYR 30ML LL (SYRINGE) ×2 IMPLANT
SYSTEM DELIVERY OSTEOVATION (MISCELLANEOUS) ×1 IMPLANT
TOWEL OR 17X24 6PK STRL BLUE (TOWEL DISPOSABLE) ×2 IMPLANT
TOWEL OR 17X26 10 PK STRL BLUE (TOWEL DISPOSABLE) ×3 IMPLANT
TRAY FOLEY W/METER SILVER 16FR (SET/KITS/TRAYS/PACK) ×2 IMPLANT
TUBE CONNECTING 12X1/4 (SUCTIONS) ×1 IMPLANT
UNDERPAD 30X30 (UNDERPADS AND DIAPERS) ×1 IMPLANT
WATER STERILE IRR 1000ML POUR (IV SOLUTION) ×4 IMPLANT

## 2016-10-24 NOTE — Brief Op Note (Signed)
10/24/2016  2:01 PM  PATIENT:  [name] [name]  55 y.o. female  PRE-OPERATIVE DIAGNOSIS:  Intracranial aneurysm  POST-OPERATIVE DIAGNOSIS:  Intracranial aneurysm  PROCEDURE:  Procedure(s) with comments: 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 Christella Noa (Right) - 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 Christella Noa  SURGEON:  Surgeon(s) and Role:    * Kevan Ny Ditty, MD - Primary    * Ashok Pall, MD - Assisting  PHYSICIAN ASSISTANT:   ASSISTANTS: Ashok Pall, MD  ANESTHESIA:   general  EBL:  Total I/O In: 2900 [I.V.:2900] Out: 3400 [Urine:3100; Blood:300]  BLOOD ADMINISTERED:none  DRAINS: none   LOCAL MEDICATIONS USED:  MARCAINE     SPECIMEN:  No Specimen  DISPOSITION OF SPECIMEN:  N/A  COUNTS:  YES  TOURNIQUET:  * No tourniquets in log *  DICTATION: .Dragon Dictation  PLAN OF CARE: Admit to inpatient   PATIENT DISPOSITION:  ICU - extubated and stable.   Delay start of Pharmacological VTE agent (>24hrs) due to surgical blood loss or risk of bleeding: yes

## 2016-10-24 NOTE — Progress Notes (Signed)
Arouses easily to commands, but somnolent Follows commands bilaterally, right greater than left Stable, improving CT head in AM

## 2016-10-24 NOTE — H&P (Addendum)
CC:  No chief complaint on file.   HPI: [name] [name] is a 55 y.o. female presents for clipping of unruptured intracranial aneurysms.  She denies changes since I saw her last in clinic.  PMH: Past Medical History:  Diagnosis Date  . Arthritis   . Brain aneurysm   . Headache   . Hypertension    QUIT TAKING BP MEDS NONE IN 10+ YEARS  . 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 Cristopher Peru)  . Thyroid disease    PROCEDURE FOR THYROID 15 YRS AGO AT [facility]  . WPW (Wolff-Parkinson-White syndrome)     PSH: Past Surgical History:  Procedure Laterality Date  . BREAST SURGERY     BX RT BREAST  BENIGN  . CRANIOTOMY N/A 01/04/2016   Procedure: Suboccipital Craniotomy and Cervical one Laminectomy for Clipping of Aneurysm;  Surgeon: Kevan Ny Tiyona Desouza, MD;  Location: [place] NEURO ORS;  Service: Neurosurgery;  Laterality: N/A;  . CRANIOTOMY N/A 07/09/2016   Procedure: Craniotomy for clipping of left middle cerebral artery aneurysm;  Surgeon: Kevan Ny Perri Lamagna, MD;  Location: [place] NEURO ORS;  Service: Neurosurgery;  Laterality: N/A;  Craniotomy for clipping of left middle cerebral artery aneurysm  . ELECTROPHYSIOLOGIC STUDY    . LAPAROSCOPIC REVISION VENTRICULAR-PERITONEAL (V-P) SHUNT N/A 02/04/2016   Procedure: LAPAROSCOPIC Insertion VENTRICULAR-PERITONEAL (V-P) SHUNT;  Surgeon: Rolm Bookbinder, MD;  Location: [place] NEURO ORS;  Service: General;  Laterality: N/A;  . RADIOLOGY WITH ANESTHESIA N/A 01/03/2016   Procedure: RADIOLOGY WITH ANESTHESIA;  Surgeon: Medication Radiologist, MD;  Location: [facility];  Service: Radiology;  Laterality: N/A;  . VENTRICULOPERITONEAL SHUNT Right 02/04/2016   Procedure: SHUNT INSERTION VENTRICULAR-PERITONEAL With Laparoscopic Assistance;  Surgeon: Kevan Ny Marigny Borre, MD;  Location: [place] NEURO ORS;  Service: Neurosurgery;  Laterality: Right;    SH: Social History  Substance Use Topics  . Smoking status:  Current Every Day Smoker    Packs/day: 0.50  . Smokeless tobacco: Never Used  . Alcohol use No    MEDS: Prior to Admission medications   Medication Sig Start Date End Date Taking? Authorizing Provider  acetaminophen (TYLENOL) 500 MG tablet Take 1,000 mg by mouth daily as needed for moderate pain or headache.   Yes Historical Provider, MD  lovastatin (MEVACOR) 20 MG tablet Take 20 mg by mouth at bedtime.   Yes Historical Provider, MD  traMADol (ULTRAM) 50 MG tablet Take 1 tablet (50 mg total) by mouth every 6 (six) hours as needed. 10/14/16  Yes Lily Kocher, PA-C  Diclofenac Sodium 3 % GEL Apply to shoulder bid Patient not taking: Reported on 10/15/2016 10/14/16   Lily Kocher, PA-C  docusate sodium (COLACE) 100 MG capsule Take 1 capsule (100 mg total) by mouth 2 (two) times daily. Patient not taking: Reported on 10/15/2016 07/10/16   Kevan Ny Harly Pipkins, MD  HYDROcodone-acetaminophen (NORCO/VICODIN) 5-325 MG tablet Take 1 tablet by mouth every 4 (four) hours as needed for moderate pain. Patient not taking: Reported on 10/14/2016 07/10/16   Kevan Ny Heily Carlucci, MD  ibuprofen (ADVIL,MOTRIN) 200 MG tablet Take 400 mg by mouth every 8 (eight) hours as needed for headache or moderate pain.    Historical Provider, MD  levETIRAcetam (KEPPRA) 750 MG tablet Take 1 tablet (750 mg total) by mouth 2 (two) times daily. Patient not taking: Reported on 10/14/2016 07/10/16   Kevan Ny Orla Estrin, MD    ALLERGY: Allergies  Allergen Reactions  . No Known Allergies  ROS: ROS  NEUROLOGIC EXAM: Awake, alert, oriented Memory and concentration grossly intact Speech fluent, appropriate CN grossly intact Motor exam: Upper Extremities Deltoid Bicep Tricep Grip  Right 5/5 5/5 5/5 5/5  Left 5/5 5/5 5/5 5/5   Lower Extremity IP Quad PF DF EHL  Right 5/5 5/5 5/5 5/5 5/5  Left 5/5 5/5 5/5 5/5 5/5   Sensation grossly intact to LT  IMAGING: No new imaging  IMPRESSION: - 55 y.o. female with  unruptured basilar apex and right MCA aneurysms.  She is grossly neurologically intact.  PLAN: - Right craniotomy for clipping of basilar apex and MCA aneurysms. - We have had a long discussion about the risks and benefits of surgery as well as the alternatives.  She understands and wishes to proceed.

## 2016-10-24 NOTE — Anesthesia Procedure Notes (Signed)
Procedure Name: Intubation Date/Time: 10/24/2016 7:56 AM Performed by: Ollen Bowl Pre-anesthesia Checklist: Patient identified, Emergency Drugs available, Suction available and Patient being monitored Patient Re-evaluated:Patient Re-evaluated prior to inductionOxygen Delivery Method: Circle system utilized Preoxygenation: Pre-oxygenation with 100% oxygen Intubation Type: IV induction Ventilation: Oral airway inserted - appropriate to patient size Laryngoscope Size: Miller and 3 Grade View: Grade I Tube type: Subglottic suction tube Tube size: 7.5 mm Number of attempts: 1 Airway Equipment and Method: Stylet Placement Confirmation: ETT inserted through vocal cords under direct vision,  positive ETCO2 and breath sounds checked- equal and bilateral Secured at: 22 cm Tube secured with: Tape Dental Injury: Teeth and Oropharynx as per pre-operative assessment

## 2016-10-24 NOTE — Transfer of Care (Signed)
Immediate Anesthesia Transfer of Care Note  Patient: [name] [name]  Procedure(s) Performed: Procedure(s): 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 Christella Noa (Right)  Patient Location: PACU  Anesthesia Type:General  Level of Consciousness: awake  Airway & Oxygen Therapy: Patient Spontanous Breathing and Patient connected to face mask oxygen  Post-op Assessment: Report given to RN and Post -op Vital signs reviewed and stable  Post vital signs: Reviewed and stable  Last Vitals:  Vitals:   10/24/16 0607 10/24/16 1423  BP: 137/72   Pulse: (!) 54   Resp: 18   Temp: 36.8 C 36.2 C    Last Pain:  Vitals:   10/24/16 0607  TempSrc: Oral      Patients Stated Pain Goal: 2 ([id number])  Complications: No apparent anesthesia complications

## 2016-10-24 NOTE — Anesthesia Preprocedure Evaluation (Signed)
Anesthesia Evaluation  Patient identified by MRN, date of birth, ID band Patient awake    Reviewed: Allergy & Precautions, NPO status , Patient's Chart, lab work & pertinent test results  History of Anesthesia Complications Negative for: history of anesthetic complications  Airway Mallampati: II  TM Distance: >3 FB Neck ROM: Full    Dental  (+) Missing, Dental Advisory Given   Pulmonary Current Smoker,    breath sounds clear to auscultation       Cardiovascular hypertension, + Peripheral Vascular Disease   Rhythm:Regular     Neuro/Psych  Headaches, PSYCHIATRIC DISORDERS Multiple intracranial aneurysms s/p SAH and multiple clippings here for further surgical management  CVA    GI/Hepatic negative GI ROS, Neg liver ROS,   Endo/Other    Renal/GU Renal InsufficiencyRenal disease     Musculoskeletal  (+) Arthritis ,   Abdominal   Peds  Hematology   Anesthesia Other Findings   Reproductive/Obstetrics                             Anesthesia Physical Anesthesia Plan  ASA: III  Anesthesia Plan: General   Post-op Pain Management:    Induction: Intravenous  Airway Management Planned: Oral ETT  Additional Equipment: Arterial line  Intra-op Plan:   Post-operative Plan: Extubation in OR and Possible Post-op intubation/ventilation  Informed Consent: I have reviewed the patients History and Physical, chart, labs and discussed the procedure including the risks, benefits and alternatives for the proposed anesthesia with the patient or authorized representative who has indicated his/her understanding and acceptance.   Dental advisory given  Plan Discussed with: CRNA and Surgeon  Anesthesia Plan Comments:         Anesthesia Quick Evaluation

## 2016-10-25 ENCOUNTER — Inpatient Hospital Stay (HOSPITAL_COMMUNITY): Payer: Medicaid Other

## 2016-10-25 LAB — CBC
HCT: 30.9 % — ABNORMAL LOW (ref 36.0–46.0)
Hemoglobin: 9.8 g/dL — ABNORMAL LOW (ref 12.0–15.0)
MCH: 27.1 pg (ref 26.0–34.0)
MCHC: 31.7 g/dL (ref 30.0–36.0)
MCV: 85.6 fL (ref 78.0–100.0)
Platelets: 173 10*3/uL (ref 150–400)
RBC: 3.61 MIL/uL — ABNORMAL LOW (ref 3.87–5.11)
RDW: 14.6 % (ref 11.5–15.5)
WBC: 13.7 10*3/uL — ABNORMAL HIGH (ref 4.0–10.5)

## 2016-10-25 LAB — BASIC METABOLIC PANEL
Anion gap: 8 (ref 5–15)
BUN: 11 mg/dL (ref 6–20)
CO2: 16 mmol/L — ABNORMAL LOW (ref 22–32)
Calcium: 7.1 mg/dL — ABNORMAL LOW (ref 8.9–10.3)
Chloride: 120 mmol/L — ABNORMAL HIGH (ref 101–111)
Creatinine, Ser: 0.96 mg/dL (ref 0.44–1.00)
GFR calc Af Amer: >60 mL/min (ref >60)
GFR calc non Af Amer: >60 mL/min (ref >60)
Glucose, Bld: 117 mg/dL — ABNORMAL HIGH (ref 65–99)
Potassium: 3 mmol/L — ABNORMAL LOW (ref 3.5–5.1)
Sodium: 144 mmol/L (ref 135–145)

## 2016-10-25 MED ORDER — SODIUM CHLORIDE 0.9 % IV SOLN
30.0000 meq | Freq: Once | INTRAVENOUS | Status: AC
  Administered 2016-10-25: 30 meq via INTRAVENOUS
  Filled 2016-10-25: qty 15

## 2016-10-25 NOTE — Progress Notes (Signed)
Subjective: Patient reports Patient awake with no complaints  Objective: Vital signs in last 24 hours: Temp:  [97.2 F (36.2 C)-99.5 F (37.5 C)] 99.5 F (37.5 C) (11/04 0400) Pulse Rate:  [73-92] 85 (11/04 0500) Resp:  [12-33] 24 (11/04 0500) BP: (117-154)/(59-97) 142/72 (11/04 0500) SpO2:  [97 %-100 %] 99 % (11/04 0500) Arterial Line BP: (88-225)/(34-103) 151/80 (11/04 0500)  Intake/Output from previous day: 11/03 0701 - 11/04 0700 In: 4085 [I.V.:3985; IV Piggyback:100] Out: 6050 [Urine:5750; Blood:300] Intake/Output this shift: No intake/output data recorded.  Awake confused moves all extremities  Lab Results:  Recent Labs  10/25/16 0429  WBC 13.7*  HGB 9.8*  HCT 30.9*  PLT 173   BMET  Recent Labs  10/25/16 0429  NA 144  K 3.0*  CL 120*  CO2 16*  GLUCOSE 117*  BUN 11  CREATININE 0.96  CALCIUM 7.1*    Studies/Results: Ct Head Wo Contrast  Result Date: 10/25/2016 CLINICAL DATA:  Status post clipping of aneurysm. EXAM: CT HEAD WITHOUT CONTRAST TECHNIQUE: Contiguous axial images were obtained from the base of the skull through the vertex without intravenous contrast. COMPARISON:  None. FINDINGS: Brain: Aneurysm clip in the area of the left MCA bifurcation is unchanged. There is a new aneurysm clip at the site of the tip of the basilar artery. There is also an aneurysm clip of the right MCA. There is pneumocephalus overlying the right cerebral convexity and left frontal pole. Small amount of right convexity extra-axial blood. There is a subdural drainage catheter present. There is a right frontal approach shunt catheter with its tip near the left foramen of Monro. There is leftward midline shift of 3 mm. Basal cisterns remain patent. No evidence of acute cortical infarct. Vascular: As above, new aneurysm clips that the basilar artery and right MCA. Skull: Status post right orbitozygomatic craniotomy. Sinuses/Orbits: Postoperative right periorbital soft tissue  swelling and nearby subcutaneous emphysema. Paranasal sinuses are free of fluid. Other: None IMPRESSION: 1. Status post clipping of right MCA and basilar tip aneurysm is with expected postoperative pneumocephalus and small volume subdural blood products. 2. No acute intracranial abnormality. Electronically Signed   By: Ulyses Jarred M.D.   On: 10/25/2016 06:12    Assessment/Plan: Posterior day 1 from basilar tip aneurysm clipping doing well. Blood pressure and heart rate stable slightly negative on fluids continue observation in the unit. Sodium 144 other electrolytes stable  LOS: 1 day     Ileta Ofarrell P 10/25/2016, 8:11 AM

## 2016-10-25 NOTE — Op Note (Signed)
10/24/2016  11:10 AM  PATIENT:  [name] [name]  55 y.o. 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 Patient understood the risks, benefits, and alternatives and potential outcomes and wished to proceed.  PROCEDURE DETAILS: After smooth induction of general endotracheal anesthesia the patient's right frontal area was clipped of hair. Mayfield pins were applied and the patient's head was fixated to the operating table turned to the left and in an extended position.  The right frontal area was wiped with alchol and lidocaine and marcaine with epinephrine were injected over the planned incision, with care taken to avoid the existing shunt.  She was prepped and draped in the usual sterile fashion.  A curvilinear incision was made from the widow's peak to the zygomatic arch, less than one centimeter in front of the right tragus.  Again, care was taken to avoid damaging the shunt.  The galea and temporalis muscle were opened using monopolar cautery, then elevated and advanced anteriorly as a musculocutaneous flap.  This was wrapped with a damp lap sponge and held anteriorly with fish hooks.  Three burr holes were made, one at the keyhole, one at the inferior aspect of the incision in the squamous temporal bone, and one posteriorly just below the superior temporal line.  The dura was then separated from the inner table and a pterional craniotomy was made using a side cutting burr.  The remaining  temporal and frontal bones were drilled flush to make a straight line to allow adequate dural retraction.  The orbital roof and sphenoid wing were then drilled down to the level of the superior orbital fissure to create a flat line that would not limit dural opening.    The retractor system was placed on the Mayfield head holder and the microscope was draped.  The microscope was brought into the field to provide lighting and magnification.  Using microsurgical technique, the dura was opened along the posterior margin of the craniotomy and reflected anteriorly.  This was secured with sutures.  The frontal lobe was elevated and arachnoid was opened over the right optic nerve to allow for egress of CSF.  This was then extended to the carotid artery.  The sylvian fissure was sharply opened distally and using sharp dissection and spreading was opened to expose the MCA trifurcation and aneurysm, the ICA bifurcation, the third cranial nerve, and the internal carotid artery.  The right optic nerve was further freed from the inferior surface of the frontal lobe.  The opticocarotid and carotid oculomotor cisterns were opened.  Further arachnoid dissection was performed to identify the trunk of the basilar artery.  TheP1 segment of the right PCA was identified and dissected to identify the junction with the right posterior communicating artery.  Care was taken to preserve the perforators arising from this artery.  This was then performed with the left P1 segment.  The neck of the aneurysm was apparent at this point.  Adenosine was administered by anesthesia with the intention of inducing asystole, but it only slowed her heart rate and  caused transient hypotension.  Fortunately this softened the aneurysm to the point it could be more easily dissected.  I attempted to place a clip but could not fit the clip and applier through the carotid-oculomotor triangle.  I inspected the right pcomm and identified an area which was  [name] of perforators.  I coagulated the artery and sharply divided it.  This allowed me to place a gently curved clip across the neck of the aneurysm.  I then placed a second clip above this one with the clips advanced further.  This allowed me to further advance the first clip.  The P1 perforators from both sides were inspected and found to be [name] from the clip blades.  Indocyanine green was then administered and we visualized flow within both P1s and observed absence of flow in the dome of the aneurysm.  Attention was turned to the right MCA trifurcation aneurysm.  It was dissected [name] of surrounding brain.  It was inspected and found to be amorphous without a clearly defined neck.  There was a Murphy's tit apparent on the largest part of the dome between the two most lateral branches of the trifurcation.  The M1 segment was temporarily occluded with an aneurysm clip.  This softened the aneurysm and allowed for placement of a curved clip at the segment of the dome which contained the Murphy's tit.  The temporary clip was removed after 6 minutes.  The doppler ultrasound was used to confirm patency of the three branches of the trifurcation.  The sylvian fissure and brain were irrigated with saline.  There was hemostasis.  The dura was closed with interrupted Nurolon sutures.  A 2-0 vicryl tack up suture was placed in the dura.  This was passed through two holes in the center of the craniotomy flap and the flap was fixated to the skull with three titanium plates.  The tack up suture was then tied and cut.  Osteovation bone void filler was then mixed and a cranioplasty was performed to repair the skull defect from drilling to provide additional exposure.  I irrigated further.  The temporalis and then the galea were closed with interrupted vicryl sutures.  The skin was closed with staples and a sterile dressing was applied.  Mayfield pins were removed.  The patient awoke without complication and was  transferred to PACU then the ICU.   PATIENT DISPOSITION:  PACU, HDS   Delay start of Pharmacological VTE agent (>24hrs) due to surgical blood loss or risk of bleeding:  Yes

## 2016-10-25 NOTE — Progress Notes (Signed)
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

## 2016-10-25 NOTE — Evaluation (Signed)
Physical Therapy Evaluation Patient Details Name: [name] [name] MRN: [id number] DOB: 08/24/1961 Today's Date: 10/25/2016   History of Present Illness  Patient is a 55 y/o admitted for aneurysm clipping for R MCA performed 10/24/16.  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  Clinical Impression  Patient presents with decreased independence with mobility due to deficits listed in PT problem list.  She will benefit from skilled PT in the acute setting to allow return home with family support and follow up HHPT.      Follow Up Recommendations Home health PT    Equipment Recommendations  None recommended by PT    Recommendations for Other Services       Precautions / Restrictions Precautions Precautions: Fall      Mobility  Bed Mobility Overal bed mobility: Needs Assistance Bed Mobility: Supine to Sit     Supine to sit: Mod assist;HOB elevated;+2 for safety/equipment     General bed mobility comments: assist for lines, lifting help for trunk and to scoot to EOB  Transfers Overall transfer level: Needs assistance Equipment used: Rolling walker (2 wheeled) Transfers: Sit to/from Stand Sit to Stand: Min assist;+2 physical assistance;+2 safety/equipment         General transfer comment: some lifting help to stand  Ambulation/Gait Ambulation/Gait assistance: Min assist;+2 safety/equipment;Mod assist Ambulation Distance (Feet): 90 Feet Assistive device: Rolling walker (2 wheeled) Gait Pattern/deviations: Step-to pattern;Decreased stride length;Shuffle     General Gait Details: very short steps and assist to  maneuver walker, wanted to go further when I asked if ready to turn after about 30'  Stairs            Wheelchair Mobility    Modified Rankin (Stroke Patients Only)       Balance Overall balance assessment: Needs assistance Sitting-balance support: Feet supported;No upper extremity supported Sitting balance-Leahy Scale: Fair Sitting balance - Comments: limited on airmattress bed   Standing balance support: Bilateral upper extremity supported Standing balance-Leahy Scale: Poor Standing balance comment: UE support for balance                             Pertinent Vitals/Pain Pain Assessment: 0-10 Pain Score: 7  Pain Location: headache Pain Descriptors / Indicators: Headache Pain Intervention(s): Patient requesting pain meds-RN notified;Repositioned    Home Living Family/patient expects to be discharged to:: Private residence Living Arrangements: Parent Available Help at Discharge: Family;Available 24 hours/day Type of Home: House Home Access: Stairs to enter   [employer] of Steps: 2 Home Layout: One level Home Equipment: Walker - 2 wheels;Shower seat;Bedside commode Additional Comments: Pt lives w/ mother and 67 y/o grandson.  Per pt mother is in good physical health to assist pt at d/c.     Prior Function Level of Independence: Independent         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     Hand Dominance   Dominant Hand: Right    Extremity/Trunk Assessment   Upper Extremity Assessment: LUE deficits/detail       LUE Deficits / Details: decreased grip compared to R   Lower Extremity Assessment: Generalized  weakness         Communication   Communication: No difficulties  Cognition Arousal/Alertness: Lethargic Behavior During Therapy: WFL for tasks assessed/performed Overall Cognitive Status: Impaired/Different from baseline Area of Impairment: Orientation;Following commands;Problem  solving Orientation Level: Time     Following Commands: Follows one step commands with increased time     Problem Solving: Slow processing      General Comments      Exercises     Assessment/Plan    PT Assessment Patient needs continued PT services  PT Problem List Decreased strength;Decreased activity tolerance;Decreased balance;Decreased mobility;Decreased cognition;Decreased knowledge of use of DME;Decreased safety awareness          PT Treatment Interventions DME instruction;Gait training;Stair training;Balance training;Therapeutic activities;Patient/family education;Functional mobility training    PT Goals (Current goals can be found in the Care Plan section)  Acute Rehab PT Goals Patient Stated Goal: To return to independent PT Goal Formulation: With patient Time For Goal Achievement: 11/01/16 Potential to Achieve Goals: Fair    Frequency Min 3X/week   Barriers to discharge        Co-evaluation               End of Session Equipment Utilized During Treatment: Gait belt Activity Tolerance: Patient tolerated treatment well Patient left: in chair;with call bell/phone within reach;with chair alarm set Nurse Communication: Patient requests pain meds         Time: 1138-1210 PT Time Calculation (min) (ACUTE ONLY): 32 min   Charges:   PT Evaluation $PT Eval Moderate Complexity: 1 Procedure PT Treatments $Gait Training: 8-22 mins   PT G CodesReginia Naas November 04, 2016, 3:38 PM  Magda Kiel, [place] 11-04-2016

## 2016-10-26 ENCOUNTER — Encounter (HOSPITAL_COMMUNITY): Payer: Self-pay | Admitting: *Deleted

## 2016-10-26 ENCOUNTER — Inpatient Hospital Stay (HOSPITAL_COMMUNITY): Payer: Medicaid Other

## 2016-10-26 LAB — POCT I-STAT 7, (LYTES, BLD GAS, ICA,H+H)
Acid-base deficit: 3 mmol/L — ABNORMAL HIGH (ref 0.0–2.0)
Bicarbonate: 22.3 mmol/L (ref 20.0–28.0)
Calcium, Ion: 1.11 mmol/L — ABNORMAL LOW (ref 1.15–1.40)
HCT: 34 % — ABNORMAL LOW (ref 36.0–46.0)
Hemoglobin: 11.6 g/dL — ABNORMAL LOW (ref 12.0–15.0)
O2 Saturation: 100 %
Patient temperature: 36.5
Potassium: 4.5 mmol/L (ref 3.5–5.1)
Sodium: 142 mmol/L (ref 135–145)
TCO2: 23 mmol/L (ref 0–100)
pCO2 arterial: 38.7 mmHg (ref 32.0–48.0)
pH, Arterial: 7.366 (ref 7.350–7.450)
pO2, Arterial: 277 mmHg — ABNORMAL HIGH (ref 83.0–108.0)

## 2016-10-26 LAB — BASIC METABOLIC PANEL
Anion gap: 11 (ref 5–15)
BUN: 18 mg/dL (ref 6–20)
CO2: 18 mmol/L — ABNORMAL LOW (ref 22–32)
Calcium: 9.7 mg/dL (ref 8.9–10.3)
Chloride: 110 mmol/L (ref 101–111)
Creatinine, Ser: 1.12 mg/dL — ABNORMAL HIGH (ref 0.44–1.00)
GFR calc Af Amer: >60 mL/min (ref >60)
GFR calc non Af Amer: 54 mL/min — ABNORMAL LOW (ref >60)
Glucose, Bld: 103 mg/dL — ABNORMAL HIGH (ref 65–99)
Potassium: 5 mmol/L (ref 3.5–5.1)
Sodium: 139 mmol/L (ref 135–145)

## 2016-10-26 MED ORDER — LEVETIRACETAM 500 MG/5ML IV SOLN
1000.0000 mg | INTRAVENOUS | Status: AC
  Administered 2016-10-26: 1000 mg via INTRAVENOUS
  Filled 2016-10-26: qty 10

## 2016-10-26 MED ORDER — ASPIRIN 81 MG PO CHEW
81.0000 mg | CHEWABLE_TABLET | Freq: Once | ORAL | Status: AC
  Administered 2016-10-26: 81 mg via ORAL
  Filled 2016-10-26: qty 1

## 2016-10-26 MED ORDER — ALBUMIN HUMAN 5 % IV SOLN
12.5000 g | Freq: Four times a day (QID) | INTRAVENOUS | Status: AC
  Administered 2016-10-27 (×4): 12.5 g via INTRAVENOUS
  Filled 2016-10-26 (×4): qty 250

## 2016-10-26 MED ORDER — ALBUMIN HUMAN 5 % IV SOLN
25.0000 g | Freq: Once | INTRAVENOUS | Status: AC
  Administered 2016-10-26: 25 g via INTRAVENOUS
  Filled 2016-10-26: qty 250

## 2016-10-26 NOTE — Progress Notes (Addendum)
Pt ambulated to the bathroom, and bathed sitting up in chair. Upon standing to return to the bed approximately 10 min later, pt exhibited significant LUE and LLE weakness such that she was incapable of standing.  Pt also unable to control oral secretions on the left and her HR dropped, not sustaining into the high 40s.    Dr. Cyndy Freeze called, STAT head CT and 1 mg IV Keppra ordered STAT.     <Later addendum 1830> Results of CT scan called into Dr. Cyndy Freeze, 81 mg ASA ordered as well as 25 g Albumin once, then Q6 x 4 doses.  Stroke Swallow Screen and initial NIH done, see flowsheets.  Orders carried out.  Pt more alert and weakness significantly improved from prior to CT scan.

## 2016-10-26 NOTE — Progress Notes (Signed)
Patient ID: [name] [name], female   DOB: 07-04-1961, 55 y.o.   MRN: [id number] Patient seems to be doing well no significant headache awake alert some periorbital edema but extra ocular movements appear to be intact possible upward gaze palsy right eye  Moves all extremities 5 out of 5 strength no pronator drift  Postoperative day 2 basilar tip aneurysm clipping doing very well a pressure heart rate stable electrolytes stable continue to observe  in the  the ICU

## 2016-10-27 DIAGNOSIS — IMO0002 Reserved for concepts with insufficient information to code with codable children: Secondary | ICD-10-CM

## 2016-10-27 DIAGNOSIS — N179 Acute kidney failure, unspecified: Secondary | ICD-10-CM

## 2016-10-27 DIAGNOSIS — I158 Other secondary hypertension: Secondary | ICD-10-CM

## 2016-10-27 DIAGNOSIS — I609 Nontraumatic subarachnoid hemorrhage, unspecified: Secondary | ICD-10-CM

## 2016-10-27 DIAGNOSIS — R0682 Tachypnea, not elsewhere classified: Secondary | ICD-10-CM

## 2016-10-27 DIAGNOSIS — D72820 Lymphocytosis (symptomatic): Secondary | ICD-10-CM

## 2016-10-27 DIAGNOSIS — R5383 Other fatigue: Secondary | ICD-10-CM

## 2016-10-27 DIAGNOSIS — I638 Other cerebral infarction: Secondary | ICD-10-CM

## 2016-10-27 DIAGNOSIS — G441 Vascular headache, not elsewhere classified: Secondary | ICD-10-CM

## 2016-10-27 DIAGNOSIS — G936 Cerebral edema: Secondary | ICD-10-CM

## 2016-10-27 DIAGNOSIS — I671 Cerebral aneurysm, nonruptured: Principal | ICD-10-CM

## 2016-10-27 DIAGNOSIS — R29818 Other symptoms and signs involving the nervous system: Secondary | ICD-10-CM

## 2016-10-27 DIAGNOSIS — D62 Acute posthemorrhagic anemia: Secondary | ICD-10-CM

## 2016-10-27 DIAGNOSIS — E876 Hypokalemia: Secondary | ICD-10-CM

## 2016-10-27 NOTE — Evaluation (Signed)
Occupational Therapy Treatment Patient Details Name: [name] [name] MRN: [id number] DOB: [birth year] Today's Date: 10/27/2016    History of present illness Patient is a 55 y/o admitted for aneurysm clipping for R MCA performed 10/24/16.  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 Found to have onset of left weakness 11/5; CT showed right posterior internal capsule infarct.   OT comments  Pt with blurry vision and balance deficits affecting all adls. Pt with lack of awareness to deficits and fixated on eating grapes this session. Pt unsteady with transfer and will need to reach MOD I and decr fall risk prior to d/c home.    Follow Up Recommendations  CIR    Equipment Recommendations  3 in 1 bedside comode    Recommendations for Other Services Rehab consult    Precautions / Restrictions Precautions Precautions: Fall Restrictions Weight Bearing Restrictions: No       Mobility Bed Mobility Overal bed mobility: Needs Assistance Bed Mobility: Supine to Sit;Sit to Supine     Supine to sit: Mod assist;HOB elevated Sit to supine: Min guard   General bed mobility comments: assist for lines, bringing LLE to EOB and to elevate trunk to get to EOB. Able to bring LEs into bed without assist.   Transfers Overall transfer level: Needs assistance Equipment used: None Transfers: Sit to/from Stand Sit to Stand: Min assist;+2 safety/equipment         General transfer comment: Assist to steady in standing. Impulsive.     Balance Overall balance assessment: Needs assistance Sitting-balance support: Single extremity  supported;Feet supported Sitting balance-Leahy Scale: Fair Sitting balance - Comments: Pt impulsive at times; requires MIn A for dynamic sitting balance esp when scooting due to unexpected anterior and posterior lean. Able to sit upright EOB with cues.  pt attempting to stand without (A) and cues to remain eob   Standing balance support: Bilateral upper extremity supported;During functional activity Standing balance-Leahy Scale: Poor Standing balance comment: Reilant on external support for balance. Min A.                   ADL Overall ADL's : Needs assistance/impaired Eating/Feeding: Set up   Grooming: Wash/dry face;Min guard               Lower Body Dressing: Maximal assistance Lower Body Dressing Details (indicate cue type and reason): pt able to left foot off bed and able to cross bil LE but unable to don sock. decr sequencing task              Functional mobility during ADLs: +2 for physical assistance;Minimal assistance General ADL Comments: pt requires cues to use L side and attend to L      Vision                 Additional Comments: pt reporting 3 grapes in container and then asked to look again and reports 4. pt able to pick up the smallest grape. Pt with R eye occluded majority of session due to edema. Pt able to open with cues. pt reports vision is blurry. pt reports needing glasses but vision more blurry than normal   Perception Perception Perception Tested?: Yes  Perception Deficits: Inattention/neglect Inattention/Neglect: Does not attend to left side of body   Praxis      Cognition   Behavior During Therapy: Impulsive Overall Cognitive Status: Impaired/Different from baseline Area of Impairment: Following commands;Safety/judgement;Problem solving;Awareness Orientation Level: Time   Memory: Decreased recall of precautions  Following Commands: Follows one step commands with increased time Safety/Judgement: Decreased awareness of  safety;Decreased awareness of deficits Awareness: Intellectual Problem Solving: Slow processing;Difficulty sequencing;Requires verbal cues General Comments: Poor awareness of deficits and safety. Reports "I am fine, everything feels fine," after almost falling when going to bathroom with RN today. Some left inattention noted but able to attend to left side with cues. pt very fixated on "grapes" and asking for grapes instead of answering questions and seemingly making light of the need to answer questions. pt very flat affect overall    Extremity/Trunk Assessment  Upper Extremity Assessment Upper Extremity Assessment: LUE deficits/detail LUE Deficits / Details: decreased grip compared to R.pt demonstrates inattention but when asked to reach for grapes pt demonstrates use.pt demonstrates decr proprioception    Lower Extremity Assessment Lower Extremity Assessment: Defer to PT evaluation   Cervical / Trunk Assessment Cervical / Trunk Assessment: Normal    Exercises     Shoulder Instructions       General Comments      Pertinent Vitals/ Pain       Pain Assessment: No/denies pain  Home Living Family/patient expects to be discharged to:: Private residence Living Arrangements: Parent Available Help at Discharge: Family;Available 24 hours/day Type of Home: House Home Access: Stairs to enter [employer] of Steps: 2   Home Layout: One level     Bathroom Shower/Tub: Curtain;Tub only   Biochemist, clinical: Standard     Home Equipment: Environmental consultant - 2 wheels;Shower seat;Bedside commode   Additional Comments: Pt lives w/ mother and 2 y/o grandson.  Per pt mother is in good physical health to assist pt at d/c. ex husband present at OT evaluation and reports he is visiting due to patients mother does not drive.       Prior Functioning/Environment Level of Independence: Independent        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 Per Ex husband patient has  not worked recently due to medical issues 06/2016    Frequency  Min 3X/week        Progress Toward Goals  OT Goals(current goals can now be found in the care plan section)     Acute Rehab OT Goals Patient Stated Goal: To return to independent OT Goal Formulation: With patient Time For Goal Achievement: 11/10/16 Potential to Achieve Goals: Good  Plan      Co-evaluation    PT/OT/SLP Co-Evaluation/Treatment: Yes Reason for Co-Treatment: Complexity of the patient's impairments (multi-system involvement);For patient/therapist safety   OT goals addressed during session: ADL's and self-care;Strengthening/ROM      End of Session Equipment Utilized During Treatment: Gait belt   Activity Tolerance Patient tolerated treatment well   Patient Left in bed;with call bell/phone within reach;with bed alarm set (high fall risk due to implusive)   Nurse Communication Mobility status;Precautions        Time: [id number] OT Time Calculation (min): 22 min  Charges: OT General Charges $OT Visit: 1 Procedure OT Evaluation $OT Eval Moderate Complexity: 1 Procedure  Parke Poisson B 10/27/2016, 1:49 PM   Jeri Modena   OTR/L Pager: [phone] Office: [phone] .

## 2016-10-27 NOTE — Consult Note (Signed)
Physical Medicine and Rehabilitation Consult  Reason for Consult: Left sided weakness, cognitive deficits and left inattention Referring Physician: Dr. Cyndy Freeze.    HPI: [name] [name] is a 55 y.o. female with history of SAH due to ruptured aneurysm 12/2015 WPW/PSVT, HA; who was admitted on 10/24/16 for clipping of B-MCA, L-PICA and basial tip  aneurysms by Dr. Cyndy Freeze.  History taken from chart review.  Post op developed left sided weakness and CT head done revealing right internal capsule infarct with right frontal and anterior temporal edema likely due to post op swelling and stable post op blood products. Pt remains very lethargic only keeping eyes open from brief periods of time.  Plan for pt to remain in ICU at present and to continue ASA and albumin.   Review of Systems  Unable to perform ROS: Acuity of condition     Past Medical History:  Diagnosis Date  . Arthritis   . Brain aneurysm   . Headache   . Hypertension    QUIT TAKING BP MEDS NONE IN 10+ YEARS  . 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 Cristopher Peru)  . Thyroid disease    PROCEDURE FOR THYROID 15 YRS AGO AT [facility]  . WPW (Wolff-Parkinson-White syndrome)     Past Surgical History:  Procedure Laterality Date  . BREAST SURGERY     BX RT BREAST  BENIGN  . CRANIOTOMY N/A 01/04/2016   Procedure: Suboccipital Craniotomy and Cervical one Laminectomy for Clipping of Aneurysm;  Surgeon: Kevan Ny Ditty, MD;  Location: [place] [facility];  Service: Neurosurgery;  Laterality: N/A;  . CRANIOTOMY N/A 07/09/2016   Procedure: Craniotomy for clipping of left middle cerebral artery aneurysm;  Surgeon: Kevan Ny Ditty, MD;  Location: [place] [facility];  Service: Neurosurgery;  Laterality: N/A;  Craniotomy for clipping of left middle cerebral artery aneurysm  . ELECTROPHYSIOLOGIC STUDY    . LAPAROSCOPIC REVISION VENTRICULAR-PERITONEAL (V-P) SHUNT N/A 02/04/2016   Procedure: LAPAROSCOPIC Insertion VENTRICULAR-PERITONEAL (V-P) SHUNT;  Surgeon: Rolm Bookbinder, MD;  Location: [place] [facility];  Service: General;  Laterality: N/A;  . RADIOLOGY WITH ANESTHESIA N/A 01/03/2016   Procedure: RADIOLOGY WITH ANESTHESIA;  Surgeon: Medication Radiologist, MD;  Location: [place];  Service: Radiology;  Laterality: N/A;  . VENTRICULOPERITONEAL SHUNT Right 02/04/2016   Procedure: SHUNT INSERTION VENTRICULAR-PERITONEAL With Laparoscopic Assistance;  Surgeon: Kevan Ny Ditty, MD;  Location: [place] [facility];  Service: Neurosurgery;  Laterality: Right;    History reviewed. No pertinent family history.    Social History:  Lives with mother. 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 She reports that she has been smoking.  She has been smoking about 0.50 packs per day. She has never used smokeless tobacco. She reports that she does not drink alcohol or use drugs.    Allergies  Allergen Reactions  . No Known Allergies     Medications Prior to Admission  Medication Sig Dispense Refill  . acetaminophen (TYLENOL) 500 MG tablet Take 1,000 mg by mouth daily as needed for moderate pain or headache.    . lovastatin (MEVACOR) 20 MG tablet Take 20 mg by mouth at bedtime.    . [DISCONTINUED] traMADol (ULTRAM) 50 MG tablet Take 1 tablet (50 mg total) by mouth every 6 (six) hours as needed. 15 tablet 0  . Diclofenac Sodium 3 % GEL Apply to shoulder bid (Patient not taking: Reported on 10/15/2016) 50 g 1  . docusate  sodium (COLACE) 100 MG capsule Take 1 capsule (100 mg total) by mouth 2 (two) times daily. (Patient not taking: Reported on 10/15/2016) 10 capsule 0  . HYDROcodone-acetaminophen (NORCO/VICODIN) 5-325 MG tablet Take 1 tablet by mouth every 4 (four) hours as needed for moderate pain. (Patient not taking: Reported on 10/14/2016) 60 tablet 0  . ibuprofen (ADVIL,MOTRIN) 200 MG tablet Take 400 mg by mouth every 8 (eight) hours as needed for headache or moderate pain.    Marland Kitchen  levETIRAcetam (KEPPRA) 750 MG tablet Take 1 tablet (750 mg total) by mouth 2 (two) times daily. (Patient not taking: Reported on 10/14/2016) 60 tablet 2    Home: [place] expects to be discharged to:: Private residence Living Arrangements: Parent Available Help at Discharge: Family, Available 24 hours/day Type of Home: House Home Access: Stairs to enter [employer] of Steps: 2 Home Layout: One level Bathroom Shower/Tub: Curtain, Tub only Biochemist, clinical: Standard Home Equipment: Environmental consultant - 2 wheels, Shower seat, Bedside commode Additional Comments: Pt lives w/ mother and 74 y/o grandson.  Per pt mother is in good physical health to assist pt at d/c. ex husband present at OT evaluation and reports he is visiting due to patients mother does not drive.   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 Per Ex husband patient has not worked recently due to medical issues 06/2016  Functional Status:  Mobility: Bed Mobility Overal bed mobility: Needs Assistance Bed Mobility: Supine to Sit, Sit to Supine Supine to sit: Mod assist, HOB elevated Sit to supine: Min guard General bed mobility comments: assist for lines, bringing LLE to EOB and to elevate trunk to get to EOB. Able to bring LEs into bed without assist.  Transfers Overall transfer level: Needs assistance Equipment used: None Transfers: Sit to/from Stand Sit to Stand: Min assist, +2 safety/equipment General transfer comment: Assist to steady in standing. Impulsive.  Ambulation/Gait Ambulation/Gait assistance: Min assist, +2 safety/equipment, +2 physical assistance Ambulation Distance (Feet): 40 Feet Assistive device: None Gait Pattern/deviations: Step-through pattern, Decreased stride length, Decreased stance time - left, Shuffle General Gait Details: Short shuffling steps with instability noted LLE; unsteady gait  pattern requiring Min A for balance/safety. Reports blurry vision as well.  Gait velocity: decreased Gait velocity interpretation: Below normal speed for age/gender    ADL: ADL Overall ADL's : Needs assistance/impaired Eating/Feeding: Set up Grooming: Wash/dry face, Min guard Lower Body Dressing: Maximal assistance Lower Body Dressing Details (indicate cue type and reason): pt able to left foot off bed and able to cross bil LE but unable to don sock. decr sequencing task  Functional mobility during ADLs: +2 for physical assistance, Minimal assistance General ADL Comments: pt requires cues to use L side and attend to L  Cognition: Cognition Overall Cognitive Status: Impaired/Different from baseline Orientation Level: Oriented to person, Oriented to place, Oriented to situation, Oriented to time Cognition Arousal/Alertness: Awake/alert Behavior During Therapy: Impulsive Overall Cognitive Status: Impaired/Different from baseline Area of Impairment: Following commands, Safety/judgement, Problem solving, Awareness Orientation Level: Time Memory: Decreased recall of precautions Following Commands: Follows one step commands with increased time Safety/Judgement: Decreased awareness of safety, Decreased awareness of deficits Awareness: Intellectual Problem Solving: Slow processing, Difficulty sequencing, Requires verbal cues General Comments: Poor awareness of deficits and safety. Reports "I am fine, everything feels fine," after almost falling when going to bathroom with RN today. Some left inattention noted but able to attend to left  side with cues. pt very fixated on "grapes" and asking for grapes instead of answering questions and seemingly making light of the need to answer questions. pt very flat affect overall  Blood pressure 137/73, pulse 68, temperature 99.5 F (37.5 C), temperature source Axillary, resp. rate 20, SpO2 95 %. Physical Exam  Nursing note and vitals  reviewed. Constitutional: She is oriented to person, place, and time. She appears well-developed and well-nourished.  HENT:  Right Ear: External ear normal.  Left Ear: External ear normal.  Foam dressing on right scalp.   Eyes: Right eye exhibits no discharge.  Right ptosis with lid edema. Denies visual changes.   Neck: Normal range of motion. Neck supple.  Cardiovascular: Normal rate and regular rhythm.   Respiratory: Effort normal and breath sounds normal. No stridor. She has no wheezes.  GI: Soft. Bowel sounds are normal. She exhibits no distension.  Musculoskeletal: She exhibits no edema or tenderness.  Neurological: She is oriented to person, place, and time.  Lethargic, slumped in bed.  Left inattention noted.  Impulsive and distracted needing redirection.  She is able to follow simple motor commands when awake.  Motor: (Limited by lethargy), >/ 3/5 throughout  Skin: Skin is warm and dry.  Incision with dressing c/d/i  Psychiatric: Her affect is inappropriate. She is slowed. Cognition and memory are impaired. She expresses impulsivity. She is noncommunicative. She is inattentive.    No results found for this or any previous visit (from the past 24 hour(s)). Ct Head Wo Contrast  Result Date: 10/26/2016 CLINICAL DATA:  Sleepiness after aneurysm clipping. Left-sided weakness. EXAM: CT HEAD WITHOUT CONTRAST TECHNIQUE: Contiguous axial images were obtained from the base of the skull through the vertex without intravenous contrast. COMPARISON:  Yesterday FINDINGS: Brain: Status post bilateral MCA, left PICA region, and basilar tip aneurysm clipping. Right MCA and basilar clips were placed recently. There is more well-defined low-density in the inferior right frontal and anterior temporal lobes along the MCA clip which could be from retraction. In the deep internal capsule on the right is low-density which is more concerning for infarct, potentially thalamic perforator or partial anterior  choroidal. Patchy low-density in the bilateral cerebral white matter is presumably from chronic microvascular disease. Remote small infarct in the inferior left cerebellum. Postoperative pneumocephalus, blood products, and hemostasis material deep to the bone flap. No ventriculomegaly. Midline shift is mild and similar to prior at 3-4 mm. Vascular: No atherosclerotic calcification or hyperdense vessel. Skull: Craniotomy is most recently on the right with bone cement posteriorly. Sinuses/Orbits: No acute finding Other: These results were called by telephone at the time of interpretation on 10/26/2016 at 5:59 pm to [place], who verbally acknowledged these results and will communicate with Dr Cyndy Freeze. IMPRESSION: 1. Edema in the low right internal capsule is likely infarct. Inferior right frontal and anterior temporal edema may be postoperative swelling. 2. Stable expected postoperative blood products. No interval hemorrhage or ventriculomegaly. 3. Midline shift measuring up to 4 mm. Electronically Signed   By: Monte Fantasia M.D.   On: 10/26/2016 18:12    Assessment/Plan: Diagnosis:  right internal capsule infarct  Labs and images independently reviewed.  Records reviewed and summated above. Stroke: Continue secondary stroke prophylaxis and Risk Factor Modification listed below:   Blood Pressure Management:  Continue current medication with prn's with permisive HTN per primary team Tobacco abuse:   Left sided hemiparesis: fit for orthosis to prevent contractures (resting hand splint for day, wrist cock up splint at night,  PRAFO, etc)  1. Does the need for close, 24 hr/day medical supervision in concert with the patient's rehab needs make it unreasonable for this patient to be served in a less intensive setting? Yes  2. Co-Morbidities requiring supervision/potential complications: SAH due to ruptured aneurysm 12/2015 WPW/PSVT, HA, tachypnea (monitor RR and O2 Sats with increased physical exertion), HTN  (monitor and provide prns in accordance with increased physical exertion and pain), hypokalemia (continue to monitor and replete as necessary), AKI (avoid nephrotoxic meds), leukocytosis (likely secondary to steroids, cont to monitor for signs and symptoms of infection, further workup if indicated), ABLA (transfuse if necessary to ensure appropriate perfusion for increased activity tolerance), lethargy (monitor as edema improves, consider medications if necessary) 3. Due to bladder management, bowel management, safety, skin/wound care, disease management, medication administration, pain management and patient education, does the patient require 24 hr/day rehab nursing? Yes 4. Does the patient require coordinated care of a physician, rehab nurse, PT (1-2 hrs/day, 5 days/week), OT (1-2 hrs/day, 5 days/week) and SLP (1-2 hrs/day, 5 days/week) to address physical and functional deficits in the context of the above medical diagnosis(es)? Yes Addressing deficits in the following areas: balance, endurance, locomotion, strength, transferring, bowel/bladder control, bathing, dressing, feeding, grooming, toileting, cognition, speech, swallowing and psychosocial support 5. Can the patient actively participate in an intensive therapy program of at least 3 hrs of therapy per day at least 5 days per week? Potentially 6. The potential for patient to make measurable gains while on inpatient rehab is excellent 7. Anticipated functional outcomes upon discharge from inpatient rehab are modified independent and supervision  with PT, modified independent and supervision with OT, supervision and min assist with SLP. 8. Estimated rehab length of stay to reach the above functional goals is: 19-22 days, possibly less as edema resolves. 9. Does the patient have adequate social supports and living environment to accommodate these discharge functional goals? Potentially 10. Anticipated D/C setting: Home 11. Anticipated post D/C  treatments: HH therapy and Home excercise program 12. Overall Rehab/Functional Prognosis: good  RECOMMENDATIONS: This patient's condition is appropriate for continued rehabilitative care in the following setting: Possibly CIR once medically appropriate.  Will need to inquire about caregiver support at discharge.  Patient has agreed to participate in recommended program. Potentially Note that insurance prior authorization may be required for reimbursement for recommended care.  Comment: Rehab Admissions Coordinator to follow up.  Delice Lesch, MD, Mellody Drown 10/27/2016

## 2016-10-27 NOTE — Progress Notes (Signed)
Developed left hemiparesis yesterday.  CT showed right posterior internal capsule infarct. Awake, alert, oriented Stable partial third nerve palsy Follows commands x4 but has moderate left hemiparesis Suspect internal capsule infarct is due to vasospasm from perforator manipulation Will continue aspirin and albumin through today Keep in ICU Will likely need CIR or SNF for rehab

## 2016-10-27 NOTE — Anesthesia Postprocedure Evaluation (Signed)
Anesthesia Post Note  Patient: [name] [name]  Procedure(s) Performed: Procedure(s) (LRB): 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 Christella Noa (Right)  Patient location during evaluation: PACU Anesthesia Type: General Level of consciousness: awake and patient cooperative Pain management: pain level controlled Vital Signs Assessment: post-procedure vital signs reviewed and stable Respiratory status: spontaneous breathing Cardiovascular status: stable Postop Assessment: no signs of nausea or vomiting Anesthetic complications: no    Last Vitals:  Vitals:   10/27/16 0500 10/27/16 0600  BP: 139/79 136/87  Pulse: 62 67  Resp: 20 15  Temp:      Last Pain:  Vitals:   10/27/16 0400  TempSrc: Axillary  PainSc:                  [name]

## 2016-10-27 NOTE — Progress Notes (Signed)
Inpatient Rehabilitation  PT and OT are recommending IP rehab.  At this time, we are recommending an IP rehab consult.  Please order if you are agreeable.    [place] Admissions Coordinator Cell [phone] Office [phone]

## 2016-10-27 NOTE — Progress Notes (Signed)
Physical Therapy Treatment Patient Details Name: [name] [name] MRN: [id number] DOB: October 01, 1961 Today's Date: 10/27/2016    History of Present Illness Patient is a 55 y/o admitted for aneurysm clipping for R MCA performed 10/24/16.  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 Found to have onset of left weakness 11/5; CT showed right posterior internal capsule infarct.    PT Comments    Patient s/p new CVA resulting in left sided weakness and mild left inattention. Demonstrates impulsivity, poor safety awareness and impaired dynamic standing balance. Tolerated gait training with min A for balance/safety. Able to functionally use LUE reaching for grapes. Initially pt not mobilizing LLE but this improved throughout session. Discharge recommendation updated to CIR. Will follow acutely.   Follow Up Recommendations  CIR     Equipment Recommendations  None recommended by PT    Recommendations for Other Services       Precautions / Restrictions Precautions Precautions: Fall Restrictions Weight Bearing Restrictions: No    Mobility  Bed Mobility Overal bed mobility: Needs Assistance Bed Mobility: Supine to Sit;Sit to Supine     Supine to sit: Mod assist;HOB elevated Sit to supine: Min guard   General bed mobility comments: assist for lines, bringing LLE to EOB and to elevate trunk to get to EOB. Able to bring LEs into bed without assist.   Transfers Overall transfer level: Needs assistance Equipment used: None Transfers: Sit to/from Stand Sit to Stand: Min assist;+2 safety/equipment         General transfer comment:  Assist to steady in standing. Impulsive.   Ambulation/Gait Ambulation/Gait assistance: Min assist;+2 safety/equipment;+2 physical assistance Ambulation Distance (Feet): 40 Feet Assistive device: None Gait Pattern/deviations: Step-through pattern;Decreased stride length;Decreased stance time - left;Shuffle Gait velocity: decreased Gait velocity interpretation: Below normal speed for age/gender General Gait Details: Short shuffling steps with instability noted LLE; unsteady gait pattern requiring Min A for balance/safety. Reports blurry vision as well.    Stairs            Wheelchair Mobility    Modified Rankin (Stroke Patients Only) Modified Rankin (Stroke Patients Only) Pre-Morbid Rankin Score: No symptoms Modified Rankin: Moderately severe disability     Balance Overall balance assessment: Needs assistance Sitting-balance support: Feet supported;No upper extremity supported Sitting balance-Leahy Scale: Fair Sitting balance - Comments: Pt impulsive at times; requires MIn A for dynamic sitting balance esp when scooting due to unexpected anterior and posterior lean. Able to sit upright EOB with cues.    Standing balance support: During functional activity Standing balance-Leahy Scale: Poor Standing balance comment: Reilant on external support for balance. Min A.                    Cognition Arousal/Alertness: Awake/alert Behavior During Therapy: Impulsive Overall Cognitive Status: Impaired/Different from baseline Area of Impairment: Following commands;Safety/judgement;Problem solving       Following Commands: Follows one step commands with increased time Safety/Judgement: Decreased awareness of safety;Decreased awareness of deficits   Problem Solving: Slow processing;Difficulty sequencing;Requires verbal cues General Comments: Poor awareness of deficits and safety. Reports "I am fine, everything feels fine," after almost falling when going to bathroom with RN  today. Some left inattention noted  but able to attend to left side with cues.    Exercises      General Comments General comments (skin integrity, edema, etc.): VSS. Ex husband stepped out of room during session.      Pertinent Vitals/Pain Pain Assessment: No/denies pain    Home Living                      Prior Function            PT Goals (current goals can now be found in the care plan section) Progress towards PT goals: Progressing toward goals    Frequency    Min 3X/week      PT Plan Discharge plan needs to be updated    Co-evaluation PT/OT/SLP Co-Evaluation/Treatment: Yes Reason for Co-Treatment: For patient/therapist safety         End of Session Equipment Utilized During Treatment: Gait belt Activity Tolerance: Patient tolerated treatment well Patient left: in bed;with call bell/phone within reach;with bed alarm set     Time: 1130-1152 PT Time Calculation (min) (ACUTE ONLY): 22 min  Charges:  $Gait Training: 8-22 mins                    G Codes:      Devan Danzer A Jayd Cadieux 10/27/2016, 12:09 PM Wray Kearns, [place], DPT [phone]

## 2016-10-28 ENCOUNTER — Encounter (HOSPITAL_COMMUNITY): Payer: Self-pay | Admitting: Neurological Surgery

## 2016-10-28 LAB — TYPE AND SCREEN
ABO/RH(D): O POS
Antibody Screen: NEGATIVE
Unit division: 0
Unit division: 0

## 2016-10-28 MED ORDER — DEXAMETHASONE SODIUM PHOSPHATE 4 MG/ML IJ SOLN
2.0000 mg | Freq: Four times a day (QID) | INTRAMUSCULAR | Status: DC
  Administered 2016-10-28 – 2016-10-29 (×4): 2 mg via INTRAVENOUS
  Filled 2016-10-28 (×4): qty 1

## 2016-10-28 NOTE — Progress Notes (Signed)
Doing better Left hemiparesis and right third nerve palsy improving Incision looks good Transfer to floor Continue therapy

## 2016-10-28 NOTE — Progress Notes (Signed)
Rehab admissions - Patient asleep on rounds this am.  Noted plans to move patient to [facility].  I have called patient's mother and have left a message.  I will follow up once I can speak with family to determine caregiver support.  Call me for questions.  [phone]

## 2016-10-29 ENCOUNTER — Inpatient Hospital Stay (HOSPITAL_COMMUNITY)
Admission: RE | Admit: 2016-10-29 | Discharge: 2016-11-08 | DRG: 057 | Disposition: A | Discharge status: Home Health | Payer: Medicaid Other | Source: Intra-hospital | Attending: Physical Medicine & Rehabilitation | Admitting: Physical Medicine & Rehabilitation

## 2016-10-29 ENCOUNTER — Encounter (HOSPITAL_COMMUNITY): Payer: Self-pay | Admitting: Nurse Practitioner

## 2016-10-29 DIAGNOSIS — Z8679 Personal history of other diseases of the circulatory system: Secondary | ICD-10-CM

## 2016-10-29 DIAGNOSIS — E876 Hypokalemia: Secondary | ICD-10-CM | POA: Diagnosis not present

## 2016-10-29 DIAGNOSIS — D72820 Lymphocytosis (symptomatic): Secondary | ICD-10-CM | POA: Diagnosis not present

## 2016-10-29 DIAGNOSIS — R414 Neurologic neglect syndrome: Secondary | ICD-10-CM

## 2016-10-29 DIAGNOSIS — I69354 Hemiplegia and hemiparesis following cerebral infarction affecting left non-dominant side: Secondary | ICD-10-CM | POA: Diagnosis present

## 2016-10-29 DIAGNOSIS — I69398 Other sequelae of cerebral infarction: Secondary | ICD-10-CM

## 2016-10-29 DIAGNOSIS — I950 Idiopathic hypotension: Secondary | ICD-10-CM | POA: Diagnosis not present

## 2016-10-29 DIAGNOSIS — R001 Bradycardia, unspecified: Secondary | ICD-10-CM | POA: Diagnosis not present

## 2016-10-29 DIAGNOSIS — I69319 Unspecified symptoms and signs involving cognitive functions following cerebral infarction: Secondary | ICD-10-CM | POA: Diagnosis not present

## 2016-10-29 DIAGNOSIS — D72829 Elevated white blood cell count, unspecified: Secondary | ICD-10-CM

## 2016-10-29 DIAGNOSIS — I63419 Cerebral infarction due to embolism of unspecified middle cerebral artery: Secondary | ICD-10-CM | POA: Diagnosis not present

## 2016-10-29 DIAGNOSIS — I639 Cerebral infarction, unspecified: Secondary | ICD-10-CM

## 2016-10-29 DIAGNOSIS — N179 Acute kidney failure, unspecified: Secondary | ICD-10-CM

## 2016-10-29 DIAGNOSIS — D62 Acute posthemorrhagic anemia: Secondary | ICD-10-CM | POA: Diagnosis not present

## 2016-10-29 DIAGNOSIS — F17210 Nicotine dependence, cigarettes, uncomplicated: Secondary | ICD-10-CM | POA: Diagnosis not present

## 2016-10-29 DIAGNOSIS — Z298 Encounter for other specified prophylactic measures: Secondary | ICD-10-CM

## 2016-10-29 DIAGNOSIS — I63311 Cerebral infarction due to thrombosis of right middle cerebral artery: Secondary | ICD-10-CM | POA: Diagnosis not present

## 2016-10-29 DIAGNOSIS — Z09 Encounter for follow-up examination after completed treatment for conditions other than malignant neoplasm: Secondary | ICD-10-CM

## 2016-10-29 DIAGNOSIS — Z79899 Other long term (current) drug therapy: Secondary | ICD-10-CM | POA: Diagnosis not present

## 2016-10-29 DIAGNOSIS — I63411 Cerebral infarction due to embolism of right middle cerebral artery: Secondary | ICD-10-CM | POA: Diagnosis not present

## 2016-10-29 DIAGNOSIS — I671 Cerebral aneurysm, nonruptured: Secondary | ICD-10-CM | POA: Diagnosis present

## 2016-10-29 MED ORDER — ACETAMINOPHEN 325 MG PO TABS: 325.0000 mg | ORAL_TABLET | ORAL | Status: DC | PRN

## 2016-10-29 MED ORDER — NALOXONE HCL 0.4 MG/ML IJ SOLN
0.0800 mg | INTRAMUSCULAR | Status: DC | PRN
Start: 2016-10-29 — End: 2016-11-08

## 2016-10-29 MED ORDER — GUAIFENESIN-DM 100-10 MG/5ML PO SYRP: 5.0000 mL | ORAL_SOLUTION | Freq: Four times a day (QID) | ORAL | Status: DC | PRN

## 2016-10-29 MED ORDER — SENNOSIDES-DOCUSATE SODIUM 8.6-50 MG PO TABS
2.0000 | ORAL_TABLET | Freq: Every day | ORAL | Status: DC
  Administered 2016-10-29: 2 via ORAL
  Filled 2016-10-29: qty 2

## 2016-10-29 MED ORDER — PROCHLORPERAZINE MALEATE 5 MG PO TABS: 5.0000 mg | ORAL_TABLET | Freq: Four times a day (QID) | ORAL | Status: DC | PRN

## 2016-10-29 MED ORDER — SORBITOL 70 % SOLN
30.0000 mL | Freq: Every day | Status: DC | PRN
  Administered 2016-10-30: 30 mL via ORAL
  Filled 2016-10-29: qty 30

## 2016-10-29 MED ORDER — DIPHENHYDRAMINE HCL 12.5 MG/5ML PO ELIX: 12.5000 mg | ORAL_SOLUTION | Freq: Four times a day (QID) | ORAL | Status: DC | PRN

## 2016-10-29 MED ORDER — ONDANSETRON HCL 4 MG PO TABS: 4.0000 mg | ORAL_TABLET | ORAL | Status: DC | PRN

## 2016-10-29 MED ORDER — FLEET ENEMA 7-19 GM/118ML RE ENEM: 1.0000 | ENEMA | Freq: Once | RECTAL | Status: DC | PRN

## 2016-10-29 MED ORDER — PRAVASTATIN SODIUM 20 MG PO TABS
20.0000 mg | ORAL_TABLET | Freq: Every day | ORAL | Status: DC
  Administered 2016-10-29 – 2016-11-07 (×10): 20 mg via ORAL
  Filled 2016-10-29 (×10): qty 1

## 2016-10-29 MED ORDER — ALUM & MAG HYDROXIDE-SIMETH 200-200-20 MG/5ML PO SUSP: 30.0000 mL | ORAL | Status: DC | PRN

## 2016-10-29 MED ORDER — ONDANSETRON HCL 4 MG/2ML IJ SOLN: 4.0000 mg | INTRAMUSCULAR | Status: DC | PRN

## 2016-10-29 MED ORDER — BISACODYL 10 MG RE SUPP: 10.0000 mg | Freq: Every day | RECTAL | Status: DC | PRN

## 2016-10-29 MED ORDER — PROCHLORPERAZINE 25 MG RE SUPP: 12.5000 mg | Freq: Four times a day (QID) | RECTAL | Status: DC | PRN

## 2016-10-29 MED ORDER — PROCHLORPERAZINE EDISYLATE 5 MG/ML IJ SOLN: 5.0000 mg | Freq: Four times a day (QID) | INTRAMUSCULAR | Status: DC | PRN

## 2016-10-29 MED ORDER — BISACODYL 5 MG PO TBEC: 5.0000 mg | DELAYED_RELEASE_TABLET | Freq: Every day | ORAL | Status: DC | PRN

## 2016-10-29 MED ORDER — PANTOPRAZOLE SODIUM 20 MG PO TBEC
20.0000 mg | DELAYED_RELEASE_TABLET | Freq: Every day | ORAL | Status: DC
  Administered 2016-10-30 – 2016-11-08 (×10): 20 mg via ORAL
  Filled 2016-10-29 (×10): qty 1

## 2016-10-29 MED ORDER — DEXAMETHASONE 2 MG PO TABS
2.0000 mg | ORAL_TABLET | Freq: Four times a day (QID) | ORAL | Status: DC
  Administered 2016-10-29 – 2016-11-06 (×32): 2 mg via ORAL
  Filled 2016-10-29 (×33): qty 1

## 2016-10-29 MED ORDER — TRAZODONE HCL 50 MG PO TABS: 25.0000 mg | ORAL_TABLET | Freq: Every evening | ORAL | Status: DC | PRN

## 2016-10-29 MED ORDER — DEXAMETHASONE SODIUM PHOSPHATE 4 MG/ML IJ SOLN: 2.0000 mg | Freq: Four times a day (QID) | INTRAMUSCULAR | Status: DC

## 2016-10-29 MED ORDER — METHYLPREDNISOLONE 4 MG PO TABS: 4.0000 mg | ORAL_TABLET | Freq: Every day | ORAL | 0 refills | Status: DC

## 2016-10-29 MED ORDER — LEVETIRACETAM 750 MG PO TABS
750.0000 mg | ORAL_TABLET | Freq: Two times a day (BID) | ORAL | Status: DC
  Administered 2016-10-29 – 2016-11-08 (×20): 750 mg via ORAL
  Filled 2016-10-29 (×21): qty 1

## 2016-10-29 MED ORDER — HYDROCODONE-ACETAMINOPHEN 5-325 MG PO TABS: 1.0000 | ORAL_TABLET | Freq: Four times a day (QID) | ORAL | Status: DC | PRN

## 2016-10-29 NOTE — Progress Notes (Signed)
Pt being transferred to inpatient rehab per orders from MD. Pt made aware of transfer and verbalized understanding. Pt's IV's were removed prior to discharge. Pt transferred via bed.

## 2016-10-29 NOTE — H&P (Signed)
Physical Medicine and Rehabilitation Admission H&P    CC: Left sided weakness, cognitive deficits and left inattention   HPI:  [name] [name] is a 55 y.o. female with history of SAH due to ruptured aneurysm 12/2015 WPW/PSVT, HA; who was admitted on 10/24/16 for clipping of B-MCA, L-PICA and basial tip  aneurysms by Dr. Cyndy Freeze. History taken from chart review. Post op developed left sided weakness and CT head done revealing right internal capsule infarct with right frontal and anterior temporal edema likely due to post op swelling and stable post op blood products.  Mentation is improving with improvement in left sided weakness, however, continues to have left neglect with right lean. Therapy ongoing and CIR recommended for follow up therapy.    Review of Systems  Constitutional: Negative for diaphoresis.  HENT: Negative for hearing loss and tinnitus.   Eyes: Negative for blurred vision and double vision.  Respiratory: Negative for cough and shortness of breath.   Cardiovascular: Negative for chest pain and palpitations.  Gastrointestinal: Negative for heartburn and nausea.  Genitourinary: Negative for dysuria and urgency.  Musculoskeletal: Negative for back pain, myalgias and neck pain.  Skin: Negative for itching and rash.  Neurological: Negative for dizziness, sensory change, weakness and headaches.  Psychiatric/Behavioral: Negative for memory loss. The patient does not have insomnia.   All other systems reviewed and are negative.   Past Medical History:  Diagnosis Date  . Arthritis   . Brain aneurysm   . Headache   . Hypertension    QUIT TAKING BP MEDS NONE IN 10+ YEARS  . 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 Cristopher Peru)  . Thyroid disease    PROCEDURE FOR THYROID 15 YRS AGO AT [facility]  . WPW (Wolff-Parkinson-White syndrome)     Past Surgical History:  Procedure Laterality Date  . BREAST SURGERY     BX  RT BREAST  BENIGN  . CRANIOTOMY N/A 01/04/2016   Procedure: Suboccipital Craniotomy and Cervical one Laminectomy for Clipping of Aneurysm;  Surgeon: Kevan Ny Ditty, MD;  Location: [place] [facility];  Service: Neurosurgery;  Laterality: N/A;  . CRANIOTOMY N/A 07/09/2016   Procedure: Craniotomy for clipping of left middle cerebral artery aneurysm;  Surgeon: Kevan Ny Ditty, MD;  Location: [place] [facility]UR[facility];  Service: Neurosurgery;  Laterality: N/A;  Craniotomy for clipping of left middle cerebral artery aneurysm  . 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 Christella Noa;  Surgeon: Kevan Ny Ditty, MD;  Location: [place];  Service: Neurosurgery;  Laterality: Right;  . ELECTROPHYSIOLOGIC STUDY    . LAPAROSCOPIC REVISION VENTRICULAR-PERITONEAL (V-P) SHUNT N/A 02/04/2016   Procedure: LAPAROSCOPIC Insertion VENTRICULAR-PERITONEAL (V-P) SHUNT;  Surgeon: Rolm Bookbinder, MD;  Location: [place] [facility]UR[facility];  Service: General;  Laterality: N/A;  . RADIOLOGY WITH ANESTHESIA N/A 01/03/2016   Procedure: RADIOLOGY WITH ANESTHESIA;  Surgeon: Medication Radiologist, MD;  Location: [place];  Service: Radiology;  Laterality: N/A;  . VENTRICULOPERITONEAL SHUNT Right 02/04/2016   Procedure: SHUNT INSERTION VENTRICULAR-PERITONEAL With Laparoscopic Assistance;  Surgeon: Kevan Ny Ditty, MD;  Location: [place] [facility];  Service: Neurosurgery;  Laterality: Right;    History reviewed. No pertinent family history. per chart review.  Social History:  Lives with mother. 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 She reports that she has been smoking.  She has been smoking about 0.50 packs per  day. She has never used smokeless tobacco. She reports that she does not drink alcohol or use drugs.    Allergies  Allergen Reactions  . No Known Allergies     Medications Prior to Admission  Medication Sig Dispense Refill  .  acetaminophen (TYLENOL) 500 MG tablet Take 1,000 mg by mouth daily as needed for moderate pain or headache.    . lovastatin (MEVACOR) 20 MG tablet Take 20 mg by mouth at bedtime.    . [DISCONTINUED] traMADol (ULTRAM) 50 MG tablet Take 1 tablet (50 mg total) by mouth every 6 (six) hours as needed. 15 tablet 0  . Diclofenac Sodium 3 % GEL Apply to shoulder bid (Patient not taking: Reported on 10/15/2016) 50 g 1  . docusate sodium (COLACE) 100 MG capsule Take 1 capsule (100 mg total) by mouth 2 (two) times daily. (Patient not taking: Reported on 10/15/2016) 10 capsule 0  . HYDROcodone-acetaminophen (NORCO/VICODIN) 5-325 MG tablet Take 1 tablet by mouth every 4 (four) hours as needed for moderate pain. (Patient not taking: Reported on 10/14/2016) 60 tablet 0  . ibuprofen (ADVIL,MOTRIN) 200 MG tablet Take 400 mg by mouth every 8 (eight) hours as needed for headache or moderate pain.    Marland Kitchen levETIRAcetam (KEPPRA) 750 MG tablet Take 1 tablet (750 mg total) by mouth 2 (two) times daily. (Patient not taking: Reported on 10/14/2016) 60 tablet 2    Home: [place] expects to be discharged to:: Private residence Living Arrangements: Parent Available Help at Discharge: Family, Available 24 hours/day Type of Home: House Home Access: Stairs to enter [employer] of Steps: 2 Home Layout: One level Bathroom Shower/Tub: Curtain, Tub only Biochemist, clinical: Standard Home Equipment: Environmental consultant - 2 wheels, Shower seat, Bedside commode Additional Comments: Pt lives w/ mother and 70 y/o grandson.  Per pt mother is in good physical health to assist pt at d/c. ex husband present at OT evaluation and reports he is visiting due to patients mother does not drive.    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 Per Ex husband patient has not worked recently due to medical issues 06/2016   Functional Status:    Mobility: Bed Mobility Overal bed mobility: Needs Assistance Bed Mobility: Rolling, Supine to Sit Rolling: Supervision (max cues) Supine to sit: Min guard, HOB elevated (with rail; incr effort) Sit to supine: Supervision General bed mobility comments: getting straight on bed required max cues (verbal and tactile) for bridging and lateral scoot Transfers Overall transfer level: Needs assistance Equipment used: Rolling walker (2 wheeled) Transfers: Sit to/from Stand, [employer] Transfers Sit to Stand: Min assist, +2 safety/equipment Stand pivot transfers: Min assist, +2 safety/equipment General transfer comment: vc for safe use of RW; assist for balance and hands-on for safety if LLE buckles Ambulation/Gait Ambulation/Gait assistance: Min assist, Max assist, +2 physical assistance, +2 safety/equipment Ambulation Distance (Feet): 60 Feet Assistive device: Rolling walker (2 wheeled) Gait Pattern/deviations: Step-through pattern, Decreased stride length, Decreased weight shift to right, Drifts right/left, Wide base of support General Gait Details: Short shuffling steps with instability noted LLE; gripping RW with Lt hand well initially, and with inattention, began to lose her grip as walking; consistenlty running into objects on left side with assist to move RW around object; with standing rest and visual scanning activities, pt"s lt side suddenly "turned off" Lt knee buckling (but maintaining tone), lt hand fell off RW and pt began falling to  her left with PT and wall/window supporting her while OT brought chair to allow her to sit (near fall) Gait velocity: decreased Gait velocity interpretation: Below normal speed for age/gender    ADL: ADL Overall ADL's : Needs assistance/impaired Eating/Feeding: Set up Grooming: Wash/dry face, Min guard Lower Body Dressing: Maximal assistance Lower Body Dressing Details (indicate cue type and reason): pt able to left foot off bed and able to cross  bil LE but unable to don sock. decr sequencing task  Functional mobility during ADLs: +2 for physical assistance, Minimal assistance General ADL Comments: pt requires cues to use L side and attend to L  Cognition: Cognition Overall Cognitive Status: Impaired/Different from baseline Orientation Level: Oriented to person, Oriented to place, Oriented to situation Cognition Arousal/Alertness: Awake/alert Behavior During Therapy: Impulsive Overall Cognitive Status: Impaired/Different from baseline Area of Impairment: Following commands, Safety/judgement, Problem solving, Awareness Orientation Level: Place (at first oriented to place; at end of session not) Memory: Decreased recall of precautions Following Commands: Follows one step commands with increased time Safety/Judgement: Decreased awareness of safety, Decreased awareness of deficits Awareness: Intellectual Problem Solving: Slow processing, Difficulty sequencing, Requires verbal cues, Requires tactile cues General Comments: Poor awareness of deficits and safety. Reports "I am fine, everything feels fine," (including after near fall with PT/OT this session. Ater left pt up in chair with alarm, within 1 minute she was standing alone going back to bed (alarm sounding)   Blood pressure (!) 111/56, pulse 65, temperature 98.7 F (37.1 C), temperature source Oral, resp. rate 20, SpO2 99 %. Physical Exam  Nursing note and vitals reviewed. Constitutional: She is oriented to person, place, and time. She appears well-developed and well-nourished.  Keeps head in pillow face down  HENT:  Head: Normocephalic.  Mouth/Throat: Oropharynx is clear and moist.  Well healed old left crani incision and right crani incision C/D/I with staples in place.   Eyes: Conjunctivae are normal. Right eye exhibits no discharge. Left eye exhibits no discharge.  Right ptosis.  Neck: Normal range of motion. Neck supple.  Cardiovascular: Normal rate and regular  rhythm.   Respiratory: Effort normal and breath sounds normal. No respiratory distress.  GI: Soft. Bowel sounds are normal. She exhibits no distension. There is no tenderness.  Musculoskeletal: She exhibits no edema or tenderness.  No edema or tenderness in extremitites  Neurological: She is alert and oriented to person, place, and time.  Left inattention.  Right ptosis and left facial weakness.  Does not have good awareness of deficits.  Motor: 4+/5 grossly throughout  Skin: Skin is warm and dry.  Psychiatric: Her affect is blunt. Her speech is delayed. She is slowed.    No results found for this or any previous visit (from the past 48 hour(s)). No results found.     Medical Problem List and Plan: 1.  Left sided weakness, cognitive deficits and severe left inattention secondary to right internal capsule infarct after aneurysm clippings with hx of SAH due to ruptured aneurysms.  2.  DVT Prophylaxis/Anticoagulation: Mechanical: Sequential compression devices, below knee Bilateral lower extremities 3. Pain Management: Will continue Hydrocodone prn for now--denies any pain.  4. Mood: LCSW to follow for evaluation and support.  5. Neuropsych: This patient is not fully capable of making decisions on her own behalf. 6. Skin/Wound Care: Monitor incision for healing. Maintain adequate nutritional and hydration status.  7. Fluids/Electrolytes/Nutrition: Monitor I/O. Check lytes in am  8. Seizure prophylaxis: On keppra.  9. H/o SVT/WPW s/p ablation: Monitor HR  bid--in NSR.  10. Hypokalemia: Follow BMP 11. Leukocytosis: Follow CBC.. 12. AKI: Avoid nephrotoxic meds. Follow BMP 13. ABLA: Follow CBC.   Post Admission Physician Evaluation: 1. Preadmission assessment reviewed and changes made below. 2. Functional deficits secondary  to right internal capsule infarct. 3. Patient is admitted to receive collaborative, interdisciplinary care between the physiatrist, rehab nursing staff, and  therapy team. 4. Patient's level of medical complexity and substantial therapy needs in context of that medical necessity cannot be provided at a lesser intensity of care such as a SNF. 5. Patient has experienced substantial functional loss from his/her baseline which was documented above under the "Functional History" and "Functional Status" headings.  Judging by the patient's diagnosis, physical exam, and functional history, the patient has potential for functional progress which will result in measurable gains while on inpatient rehab.  These gains will be of substantial and practical use upon discharge  in facilitating mobility and self-care at the household level. 74. Physiatrist will provide 24 hour management of medical needs as well as oversight of the therapy plan/treatment and provide guidance as appropriate regarding the interaction of the two. 7. 24 hour rehab nursing will assist with bladder management, safety, skin/wound care, medication administration, pain management and patient education  and help integrate therapy concepts, techniques,education, etc. 8. PT will assess and treat for/with: Lower extremity strength, range of motion, stamina, balance, functional mobility, safety, adaptive techniques and equipment, woundcare, coping skills, pain control, stroke education.   Goals are: Mod I/Supervision. 9. OT will assess and treat for/with: ADL's, functional mobility, safety, upper extremity strength, adaptive techniques and equipment, wound mgt, ego support, and community reintegration.   Goals are: Mod I/Supervision. Therapy may not proceed with showering this patient. 10. SLP will assess and treat for/with: cognition.  Goals are: Supervision/Mod I. 11. Case Management and Social Worker will assess and treat for psychological issues and discharge planning. 12. Team conference will be held weekly to assess progress toward goals and to determine barriers to discharge. 13. Patient will receive  at least 3 hours of therapy per day at least 5 days per week. 14. ELOS: 10-15 days.       15. Prognosis:  good  Delice Lesch, MD, Mellody Drown 10/29/2016

## 2016-10-29 NOTE — Progress Notes (Signed)
Occupational Therapy Treatment Patient Details Name: [name] [name] MRN: [id number] DOB: Oct 26, 1961 Today's Date: 10/29/2016    History of present illness Patient is a 55 y/o admitted for aneurysm clipping for R MCA performed 10/24/16.  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 Found to have onset of left weakness 11/5; CT showed right posterior internal capsule infarct.   OT comments  Pt demonstrates near fall with therapy today in the hall due to L inattention. Pt with poor awareness to deficits and attempting to exit chair with chair alarm sounding. Pt returned to supine to decr fall risk and pt motivated to go back to bed. Pt will require total +2 (A) for all transfers with staff to decr fall risk and pt safety.   Follow Up Recommendations  CIR    Equipment Recommendations  3 in 1 bedside comode    Recommendations for Other Services Rehab consult    Precautions / Restrictions Precautions Precautions: Fall Precaution Comments: high fall risk with Lt inattention and at times Lt side "turns off" (knee buckle, drops hand from RW)       Mobility Bed Mobility Overal bed mobility: Needs Assistance Bed Mobility: Rolling;Supine to Sit Rolling: Supervision   Supine to sit: Min guard;HOB elevated Sit to supine: Supervision   General bed mobility comments: getting straight on bed required max cues (verbal and tactile) for bridging and lateral scoot  Transfers Overall transfer level: Needs assistance Equipment used: Rolling walker (2 wheeled) Transfers: Sit to/from Stand Sit to Stand: Min assist;+2 safety/equipment Stand  pivot transfers: Min assist;+2 safety/equipment       General transfer comment: vc for safe use of RW; assist for balance and hands-on for safety if LLE buckles    Balance Overall balance assessment: Needs assistance     Sitting balance - Comments: b Postural control: Left lateral lean Standing balance support: Bilateral upper extremity supported;During functional activity Standing balance-Leahy Scale: Zero Standing balance comment: zero at worst                   ADL Overall ADL's : Needs assistance/impaired Eating/Feeding: Set up;Bed level Eating/Feeding Details (indicate cue type and reason): pt requesting grapes and OT calling to make sure lunch tray arrives with cups of grapes Grooming: Wash/dry hands;Minimal assistance;Standing Grooming Details (indicate cue type and reason): pt able to feel water as warm on L hand . pt requires max cues to locate soap on L side of sink. pt finished washing hands and neglected to turn off water. Pt attempting to turn off water using R faucet knob only and needed cue to locate L                  Toilet Transfer: Minimal assistance   Toileting- Clothing Manipulation and Hygiene: Moderate assistance       Functional mobility during ADLs: Minimal assistance General ADL Comments: pt using RW appropriate bed to bathroom. pt once in hallway walking into objects on L side and L hand starting to slide off RW handle. pt progressed to tall windowns leading into the [place] tower for visual scanning. pt needed max cues to locate items. Pt suddenly with L Inattention and L side buckle  with total +2 (A) to remain standing      Vision                 Additional Comments: pt required head turns to locate items and objects in left visual field. Ot to continue to assess vision. pt remains with R eye half mask opening   Perception     Praxis      Cognition   Behavior During Therapy: Impulsive Overall Cognitive Status:  Impaired/Different from baseline Area of Impairment: Following commands;Safety/judgement;Problem solving;Awareness;Orientation;Attention Orientation Level: Disoriented to;Place Current Attention Level: Sustained Memory: Decreased recall of precautions;Decreased short-term memory  Following Commands: Follows one step commands with increased time Safety/Judgement: Decreased awareness of safety;Decreased awareness of deficits Awareness: Intellectual Problem Solving: Slow processing;Decreased initiation;Difficulty sequencing General Comments: L inattention and poor awareness to deficits. pt standing and attempting to get into bed with chair alarm sounding to not get up by yourself. Pt with near fall in hallway with therapist and states " im fine everything is fine"     Extremity/Trunk Assessment               Exercises     Shoulder Instructions       General Comments      Pertinent Vitals/ Pain       Pain Assessment: No/denies pain  Home Living                                          Prior Functioning/Environment              Frequency  Min 3X/week        Progress Toward Goals  OT Goals(current goals can now be found in the care plan section)  Progress towards OT goals: Progressing toward goals  Acute Rehab OT Goals Patient Stated Goal: To return to independent OT Goal Formulation: With patient Time For Goal Achievement: 11/10/16 Potential to Achieve Goals: Good  Plan Discharge plan remains appropriate    Co-evaluation    PT/OT/SLP Co-Evaluation/Treatment: Yes Reason for Co-Treatment: Complexity of the patient's impairments (multi-system involvement);For patient/therapist safety PT goals addressed during session: Mobility/safety with mobility;Balance;Proper use of DME OT goals addressed during session: ADL's and self-care;Strengthening/ROM      End of Session Equipment Utilized During Treatment: Gait belt;Rolling walker   Activity  Tolerance Patient tolerated treatment well   Patient Left in bed;with call bell/phone within reach;with bed alarm set   Nurse Communication Mobility status;Precautions        Time: 1100-1126 OT Time Calculation (min): 26 min  Charges: OT General Charges $OT Visit: 1 Procedure OT Treatments $Self Care/Home Management : 8-22 mins  Parke Poisson B 10/29/2016, 2:30 PM  Jeri Modena   OTR/L Pager: [phone] Office: [phone] .

## 2016-10-29 NOTE — Discharge Summary (Signed)
Date of Admission: 10/24/2016  Date of Discharge: 10/29/16  PRE-OPERATIVE DIAGNOSIS:  Unruptured basilar apex and right middle cerebral artery aneurysms  POST-OPERATIVE DIAGNOSIS:  Same  PROCEDURE:  Right pterional craniotomy with sphenoid osteotomy for clipping of intracranial aneurysms, use of operating microscope, indocyanine green angiography, cranioplasty with bone void filler  Attending: Kevan Ny Ermin Parisien, MD  Hospital Course:  The patient was admitted for the above listed operation.  Post-operatively she had a small right internal capsule infarct with associated mild to moderate hemiparesis.  This started to improve during the course of her hospitalization.  She is discharged to CIR in stable condition.  Follow up: 3 weeks    Medication List    TAKE these medications   acetaminophen 500 MG tablet Commonly known as:  TYLENOL Take 1,000 mg by mouth daily as needed for moderate pain or headache.   Diclofenac Sodium 3 % Gel Apply to shoulder bid   docusate sodium 100 MG capsule Commonly known as:  COLACE Take 1 capsule (100 mg total) by mouth 2 (two) times daily.   HYDROcodone-acetaminophen 5-325 MG tablet Commonly known as:  NORCO/VICODIN Take 1 tablet by mouth every 4 (four) hours as needed for moderate pain.   ibuprofen 200 MG tablet Commonly known as:  ADVIL,MOTRIN Take 400 mg by mouth every 8 (eight) hours as needed for headache or moderate pain.   levETIRAcetam 750 MG tablet Commonly known as:  KEPPRA Take 1 tablet (750 mg total) by mouth 2 (two) times daily.   lovastatin 20 MG tablet Commonly known as:  MEVACOR Take 20 mg by mouth at bedtime.   methylPREDNISolone 4 MG tablet Commonly known as:  MEDROL Take 1 tablet (4 mg total) by mouth daily.

## 2016-10-29 NOTE — Progress Notes (Signed)
Retta Diones, RN Rehab Admission Coordinator Signed Physical Medicine and Rehabilitation  PMR Pre-admission Date of Service: 10/29/2016 3:16 PM  Related encounter: Admission (Current) from 10/24/2016 in [place]       [] Hide copied text PMR Admission Coordinator Pre-Admission Assessment  Patient: [name] [name] is an 55 y.o., female MRN: [id number] DOB: Sep 06, 1961 Height: 5\' 3"  (160 cm) Weight: 89.4 kg (197 lb)                                                                                                                                                  Insurance Information HMO: No    PPO:       PCP:       IPA:       80/20:       OTHER:   PRIMARY:  Medicaid [place] access      Policy#: [id number]      Subscriber: [place]ie [place] CM Name:        Phone#:       Fax#:   Pre-Cert#:        Employer:  Not working for the past few months Benefits:  Phone #: [phone]     Name:  Automated Eff. Date: Eligible 10/28/16 with coverage code [facility]     Deduct:        Out of Pocket Max:        Life Max:   CIR:        SNF:   Outpatient:       Co-Pay:   Home Health:        Co-Pay:   DME:       Co-Pay:   Providers:   Medicaid Application Date:        Case Manager:   Disability Application Date:        Case Worker:    Emergency Contact Information        Contact Information    Name Relation Home Work Mobile   [place] Other [phone]     [name] Mother [phone]       Current Medical History  Patient Admitting Diagnosis:  R IC infarct post craniotomy for aneurysm clippings  History of Present Illness: A 55 y.o.femalewith history of SAH due to ruptured aneurysm 12/2015 WPW/PSVT, HA; who was admitted on 10/24/16 for clipping of B-MCA, L-PICA and basial tip aneurysms by Dr. Cyndy Freeze. History taken from chart review. Post op developed left sided weakness and CT head done revealing right internal capsule infarct with right  frontal and anterior temporal edema likely due to post op swelling and stable post op blood products. Pt remains very lethargic only keeping eyes open from brief periods of time. Plan for pt to remain in ICU at present and to continue ASA and albumin.    Total: 6=NIH  Past  Medical History      Past Medical History:  Diagnosis Date  . Arthritis   . Brain aneurysm   . Headache   . Hypertension    QUIT TAKING BP MEDS NONE IN 10+ YEARS  . 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 Cristopher Peru)  . Thyroid disease    PROCEDURE FOR THYROID 15 YRS AGO AT [facility]  . WPW (Wolff-Parkinson-White syndrome)     Family History  family history is not on file.  Prior Rehab/Hospitalizations: Had [place] therapies with [facility] this past year after surgeries for aneurysm clippings  Has the patient had major surgery during 100 days prior to admission? Yes.  Had surgery July 09, 2016  Current Medications   Current Facility-Administered Medications:  .  0.9 %  sodium chloride infusion, , Intravenous, Continuous, Kevan Ny Ditty, MD, Last Rate: 100 mL/hr at 10/28/16 2236 .  acetaminophen (TYLENOL) tablet 1,000 mg, 1,000 mg, Oral, Daily PRN, Kevan Ny Ditty, MD .  bisacodyl (DULCOLAX) EC tablet 5 mg, 5 mg, Oral, Daily PRN, Kevan Ny Ditty, MD .  dexamethasone (DECADRON) injection 2 mg, 2 mg, Intravenous, Q6H, Kevan Ny Ditty, MD, 2 mg at 10/29/16 1232 .  docusate sodium (COLACE) capsule 100 mg, 100 mg, Oral, BID, Kevan Ny Ditty, MD, 100 mg at 10/29/16 1022 .  hydrALAZINE (APRESOLINE) injection 5-10 mg, 5-10 mg, Intravenous, Q1H PRN, Kevan Ny Ditty, MD, 5 mg at 10/26/16 1027 .  HYDROcodone-acetaminophen (NORCO/VICODIN) 5-325 MG per tablet 1 tablet, 1 tablet, Oral, Q4H PRN, Kevan Ny Ditty, MD, 1 tablet at 10/26/16 1233 .  HYDROmorphone (DILAUDID) injection 0.5-1 mg, 0.5-1 mg, Intravenous, Q2H PRN,  Kevan Ny Ditty, MD, 1 mg at 10/25/16 1226 .  levETIRAcetam (KEPPRA) tablet 750 mg, 750 mg, Oral, BID, Kevan Ny Ditty, MD, 750 mg at 10/29/16 1018 .  naloxone (NARCAN) injection 0.08 mg, 0.08 mg, Intravenous, PRN, Kevan Ny Ditty, MD .  ondansetron [facility]) tablet 4 mg, 4 mg, Oral, Q4H PRN **OR** ondansetron (ZOFRAN) injection 4 mg, 4 mg, Intravenous, Q4H PRN, Kevan Ny Ditty, MD .  pantoprazole (PROTONIX) EC tablet 20 mg, 20 mg, Oral, Daily, Kevan Ny Ditty, MD, 20 mg at 10/29/16 1023 .  pravastatin (PRAVACHOL) tablet 20 mg, 20 mg, Oral, q1800, Kevan Ny Ditty, MD, 20 mg at 10/28/16 1714 .  senna (SENOKOT) tablet 8.6 mg, 1 tablet, Oral, BID, Kevan Ny Ditty, MD, 8.6 mg at 10/29/16 1018 .  sodium phosphate (FLEET) 7-19 GM/118ML enema 1 enema, 1 enema, Rectal, Once PRN, Tamala Fothergill, MD  Patients Current Diet: Diet Heart Room service appropriate? Yes; Fluid consistency: Thin Diet - low sodium heart healthy  Precautions / Restrictions Precautions Precautions: Fall Precaution Comments: high fall risk with Lt inattention and at times Lt side "turns off" (knee buckle, drops hand from RW) Restrictions Weight Bearing Restrictions: No   Has the patient had 2 or more falls or a fall with injury in the past year?No  Prior Activity Level Community (5-7x/wk): Martin Majestic out daily, drives as needed  Development worker, international aid / [place] Devices/Equipment: None Home Equipment: Environmental consultant - 2 wheels, Shower seat, Bedside commode  Prior Device Use: Indicate devices/aids used by the patient prior to current illness, exacerbation or injury? None  Prior Functional Level Prior Function Level of Independence: Independent 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 Per Ex husband patient has not worked recently due to medical issues 06/2016  Self Care: Did the patient need help bathing, dressing, using the toilet or eating?   Independent  Indoor Mobility: Did the patient need assistance with walking from room to room (with or without device)? Independent  Stairs: Did the patient need assistance with internal or external stairs (with or without device)? Independent  Functional Cognition: Did the patient need help planning regular tasks such as shopping or remembering to take medications? Independent  Current Functional Level Cognition Overall Cognitive Status: Impaired/Different from baseline Current Attention Level: Sustained Orientation Level: (P) Oriented to person, Oriented to place, Oriented to situation Following Commands: Follows one step commands with increased time Safety/Judgement: Decreased awareness of safety, Decreased awareness of deficits General Comments: L inattention and poor awareness to deficits. pt standing and attempting to get into bed with chair alarm sounding to not get up by yourself. Pt with near fall in hallway with therapist and states " im fine everything is fine"     Extremity Assessment (includes Sensation/Coordination) Upper Extremity Assessment: LUE deficits/detail LUE Deficits / Details: decreased grip compared to R.pt demonstrates inattention but when asked to reach for grapes pt demonstrates use.pt demonstrates decr proprioception   Lower Extremity Assessment: Defer to PT evaluation   ADLs Overall ADL's : Needs assistance/impaired Eating/Feeding: Set up, Bed level Eating/Feeding Details (indicate cue type and reason): pt requesting grapes and OT calling to make sure lunch tray arrives with cups of grapes Grooming: Wash/dry hands, Minimal assistance, Standing Grooming Details (indicate cue type and reason): pt able to feel water as warm on L hand . pt requires max cues to locate soap on L side of sink. pt finished washing hands and neglected to turn off water. Pt attempting to turn off water using R faucet knob only and needed cue to locate L  Lower Body Dressing: Maximal  assistance Lower Body Dressing Details (indicate cue type and reason): pt able to left foot off bed and able to cross bil LE but unable to don sock. decr sequencing task  Toilet Transfer: Minimal assistance Toileting- Clothing Manipulation and Hygiene: Moderate assistance Functional mobility during ADLs: Minimal assistance General ADL Comments: pt using RW appropriate bed to bathroom. pt once in hallway walking into objects on L side and L hand starting to slide off RW handle. pt progressed to tall windowns leading into the [place] tower for visual scanning. pt needed max cues to locate items. Pt suddenly with L Inattention and L side buckle with total +2 (A) to remain standing   Mobility Overal bed mobility: Needs Assistance Bed Mobility: Rolling, Supine to Sit Rolling: Supervision Supine to sit: Min guard, HOB elevated Sit to supine: Supervision General bed mobility comments: getting straight on bed required max cues (verbal and tactile) for bridging and lateral scoot   Transfers Overall transfer level: Needs assistance Equipment used: Rolling walker (2 wheeled) Transfers: Sit to/from Stand Sit to Stand: Min assist, +2 safety/equipment Stand pivot transfers: Min assist, +2 safety/equipment General transfer comment: vc for safe use of RW; assist for balance and hands-on for safety if LLE buckles   Ambulation / Gait / Stairs / Wheelchair Mobility Ambulation/Gait Ambulation/Gait assistance: Min assist, Max assist, +2 physical assistance, +2 safety/equipment Ambulation Distance (Feet): 60 Feet Assistive device: Rolling walker (2 wheeled) Gait Pattern/deviations: Step-through pattern, Decreased stride length, Decreased weight shift to right, Drifts right/left, Wide base of support General Gait Details: Short shuffling steps with instability noted LLE; gripping RW with Lt hand well initially, and with inattention, began to lose her grip as  walking; consistenlty running into objects on left side with  assist to move RW around object; with standing rest and visual scanning activities, pt"s lt side suddenly "turned off" Lt knee buckling (but maintaining tone), lt hand fell off RW and pt began falling to her left with PT and wall/window supporting her while OT brought chair to allow her to sit (near fall) Gait velocity: decreased Gait velocity interpretation: Below normal speed for age/gender   Posture / Balance Dynamic Sitting Balance Sitting balance - Comments: b Balance Overall balance assessment: Needs assistance Sitting-balance support: Single extremity supported, Feet supported Sitting balance-Leahy Scale: Fair Sitting balance - Comments: b Postural control: Left lateral lean Standing balance support: Bilateral upper extremity supported, During functional activity Standing balance-Leahy Scale: Zero Standing balance comment: zero at worst   Special needs/care consideration BiPAP/CPAP No CPM No Continuous Drip IV 0.9% NS 100 mL/hr Dialysis No        Life Vest No Oxygen No Special Bed No Trach Size No Wound Vac (area) No  Skin Scalp incision with staples                            Bowel mgmt: No documented BM since admission Bladder mgmt: Urgency, voiding in bathroom with assistance Diabetic mgmt No   Previous Home Environment Living Arrangements: Parent Available Help at Discharge: Family, Available 24 hours/day Type of Home: House Home Layout: One level Home Access: Stairs to enter Technical brewer of Steps: 2 Bathroom Shower/Tub: Curtain, Tub only Biochemist, clinical: Standard Home Care Services: No Additional Comments: Pt lives w/ mother and 57 y/o grandson.  Per pt mother is in good physical health to assist pt at d/c. ex husband present at OT evaluation and reports he is visiting due to patients mother does not drive.   Discharge Living Setting Plans for Discharge Living Setting: House, Lives with (comment) (Lives with mom and 62 yo grandson.) Type of Home at  Discharge: House Discharge Home Layout: One level Discharge Home Access: Stairs to enter Entrance Stairs-Number of Steps: 2 steps Does the patient have any problems obtaining your medications?: No  Social/Family/Support Systems Patient Roles: Spouse, Other (Comment) (Has ex-husband and mom and a 27 yo grandson.) Contact Information: Berniece Andreas - ex-husband - [phone] Anticipated Caregiver: mom Anticipated Caregiver's Contact Information: [name] - mother - [phone] Ability/Limitations of Caregiver: Mom can assist.  Ex-husband checks in weekly. Caregiver Availability: 24/7 Discharge Plan Discussed with Primary Caregiver: Yes Is Caregiver In Agreement with Plan?: Yes Does Caregiver/Family have Issues with Lodging/Transportation while Pt is in Rehab?: No  Goals/Additional Needs Patient/Family Goal for Rehab: PT/OT mod I and supervision, SLP supervision and min assist goals Expected length of stay: 19-22 days Cultural Considerations: None Dietary Needs: Heart diet, thin liquids Equipment Needs: TBD Pt/Family Agrees to Admission and willing to participate: Yes (I spoke with patient's ex-husband at the bedside.) Program Orientation Provided & Reviewed with Pt/Caregiver Including Roles  & Responsibilities: Yes  Decrease burden of Care through IP rehab admission: N/A  Possible need for SNF placement upon discharge: Not anticipated  Patient Condition: This patient's medical and functional status has changed since the consult dated: 10/27/16 in which the Rehabilitation Physician determined and documented that the patient's condition is appropriate for intensive rehabilitative care in an inpatient rehabilitation facility. See "History of Present Illness" (above) for medical update. Functional changes are: Currently requiring min to max assist to ambulate 60 feet RW. Patient's medical and functional status  update has been discussed with the Rehabilitation physician and patient  remains appropriate for inpatient rehabilitation. Will admit to inpatient rehab today.  Preadmission Screen Completed By:  Retta Diones, 10/29/2016 3:31 PM ______________________________________________________________________   Discussed status with Dr. Posey Pronto on 10/29/16 at 1530 and received telephone approval for admission today.  Admission Coordinator:  Retta Diones, time1530/Date11/08/17       Cosigned by: Ankit Lorie Phenix, MD at 10/29/2016 3:37 PM  Revision History

## 2016-10-29 NOTE — Progress Notes (Signed)
Ankit Lorie Phenix, MD Physician Signed Physical Medicine and Rehabilitation  Consult Note Date of Service: 10/27/2016 4:43 PM  Related encounter: Admission (Current) from 10/24/2016 in [facility] Collapse All   [] Hide copied text [] Hover for attribution information      Physical Medicine and Rehabilitation Consult  Reason for Consult: Left sided weakness, cognitive deficits and left inattention Referring Physician: Dr. Cyndy Freeze.    HPI: [name] [name] is a 55 y.o. female with history of SAH due to ruptured aneurysm 12/2015 WPW/PSVT, HA; who was admitted on 10/24/16 for clipping of B-MCA, L-PICA and basial tip  aneurysms by Dr. Cyndy Freeze.  History taken from chart review.  Post op developed left sided weakness and CT head done revealing right internal capsule infarct with right frontal and anterior temporal edema likely due to post op swelling and stable post op blood products. Pt remains very lethargic only keeping eyes open from brief periods of time.  Plan for pt to remain in ICU at present and to continue ASA and albumin.   Review of Systems  Unable to perform ROS: Acuity of condition         Past Medical History:  Diagnosis Date  . Arthritis   . Brain aneurysm   . Headache   . Hypertension    QUIT TAKING BP MEDS NONE IN 10+ YEARS  . 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 Cristopher Peru)  . Thyroid disease    PROCEDURE FOR THYROID 15 YRS AGO AT [facility]  . WPW (Wolff-Parkinson-White syndrome)          Past Surgical History:  Procedure Laterality Date  . BREAST SURGERY     BX RT BREAST  BENIGN  . CRANIOTOMY N/A 01/04/2016   Procedure: Suboccipital Craniotomy and Cervical one Laminectomy for Clipping of Aneurysm;  Surgeon: Kevan Ny Ditty, MD;  Location: [facility];  Service: Neurosurgery;  Laterality: N/A;  . CRANIOTOMY N/A 07/09/2016     Procedure: Craniotomy for clipping of left middle cerebral artery aneurysm;  Surgeon: Kevan Ny Ditty, MD;  Location: [facility];  Service: Neurosurgery;  Laterality: N/A;  Craniotomy for clipping of left middle cerebral artery aneurysm  . ELECTROPHYSIOLOGIC STUDY    . LAPAROSCOPIC REVISION VENTRICULAR-PERITONEAL (V-P) SHUNT N/A 02/04/2016   Procedure: LAPAROSCOPIC Insertion VENTRICULAR-PERITONEAL (V-P) SHUNT;  Surgeon: Rolm Bookbinder, MD;  Location: [facility];  Service: General;  Laterality: N/A;  . RADIOLOGY WITH ANESTHESIA N/A 01/03/2016   Procedure: RADIOLOGY WITH ANESTHESIA;  Surgeon: Medication Radiologist, MD;  Location: [facility];  Service: Radiology;  Laterality: N/A;  . VENTRICULOPERITONEAL SHUNT Right 02/04/2016   Procedure: SHUNT INSERTION VENTRICULAR-PERITONEAL With Laparoscopic Assistance;  Surgeon: Kevan Ny Ditty, MD;  Location: [facility];  Service: Neurosurgery;  Laterality: Right;    History reviewed. No pertinent family history.    Social History:  Lives with mother. 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 She reports that she has been smoking.  She has been smoking about 0.50 packs per day. She has never used smokeless tobacco. She reports that she does not drink alcohol or use drugs.        Allergies  Allergen Reactions  . No Known Allergies           Medications Prior to Admission  Medication Sig Dispense Refill  . acetaminophen (TYLENOL) 500 MG tablet Take 1,000 mg by mouth  daily as needed for moderate pain or headache.    . lovastatin (MEVACOR) 20 MG tablet Take 20 mg by mouth at bedtime.    . [DISCONTINUED] traMADol (ULTRAM) 50 MG tablet Take 1 tablet (50 mg total) by mouth every 6 (six) hours as needed. 15 tablet 0  . Diclofenac Sodium 3 % GEL Apply to shoulder bid (Patient not taking: Reported on 10/15/2016) 50 g 1  . docusate sodium (COLACE) 100 MG capsule Take 1 capsule (100 mg total) by mouth 2 (two) times daily.  (Patient not taking: Reported on 10/15/2016) 10 capsule 0  . HYDROcodone-acetaminophen (NORCO/VICODIN) 5-325 MG tablet Take 1 tablet by mouth every 4 (four) hours as needed for moderate pain. (Patient not taking: Reported on 10/14/2016) 60 tablet 0  . ibuprofen (ADVIL,MOTRIN) 200 MG tablet Take 400 mg by mouth every 8 (eight) hours as needed for headache or moderate pain.    Marland Kitchen levETIRAcetam (KEPPRA) 750 MG tablet Take 1 tablet (750 mg total) by mouth 2 (two) times daily. (Patient not taking: Reported on 10/14/2016) 60 tablet 2    Home: [place] expects to be discharged to:: Private residence Living Arrangements: Parent Available Help at Discharge: Family, Available 24 hours/day Type of Home: House Home Access: Stairs to enter [employer] of Steps: 2 Home Layout: One level Bathroom Shower/Tub: Curtain, Tub only Biochemist, clinical: Standard Home Equipment: Environmental consultant - 2 wheels, Shower seat, Bedside commode Additional Comments: Pt lives w/ mother and 50 y/o grandson.  Per pt mother is in good physical health to assist pt at d/c. ex husband present at OT evaluation and reports he is visiting due to patients mother does not drive.   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 Per Ex husband patient has not worked recently due to medical issues 06/2016  Functional Status:  Mobility: Bed Mobility Overal bed mobility: Needs Assistance Bed Mobility: Supine to Sit, Sit to Supine Supine to sit: Mod assist, HOB elevated Sit to supine: Min guard General bed mobility comments: assist for lines, bringing LLE to EOB and to elevate trunk to get to EOB. Able to bring LEs into bed without assist.  Transfers Overall transfer level: Needs assistance Equipment used: None Transfers: Sit to/from Stand Sit to Stand: Min assist, +2 safety/equipment General transfer comment: Assist to steady in  standing. Impulsive.  Ambulation/Gait Ambulation/Gait assistance: Min assist, +2 safety/equipment, +2 physical assistance Ambulation Distance (Feet): 40 Feet Assistive device: None Gait Pattern/deviations: Step-through pattern, Decreased stride length, Decreased stance time - left, Shuffle General Gait Details: Short shuffling steps with instability noted LLE; unsteady gait pattern requiring Min A for balance/safety. Reports blurry vision as well.  Gait velocity: decreased Gait velocity interpretation: Below normal speed for age/gender    ADL: ADL Overall ADL's : Needs assistance/impaired Eating/Feeding: Set up Grooming: Wash/dry face, Min guard Lower Body Dressing: Maximal assistance Lower Body Dressing Details (indicate cue type and reason): pt able to left foot off bed and able to cross bil LE but unable to don sock. decr sequencing task  Functional mobility during ADLs: +2 for physical assistance, Minimal assistance General ADL Comments: pt requires cues to use L side and attend to L  Cognition: Cognition Overall Cognitive Status: Impaired/Different from baseline Orientation Level: Oriented to person, Oriented to place, Oriented to situation, Oriented to time Cognition Arousal/Alertness: Awake/alert Behavior During Therapy: Impulsive Overall Cognitive Status: Impaired/Different from baseline Area of Impairment: Following commands, Safety/judgement, Problem  solving, Awareness Orientation Level: Time Memory: Decreased recall of precautions Following Commands: Follows one step commands with increased time Safety/Judgement: Decreased awareness of safety, Decreased awareness of deficits Awareness: Intellectual Problem Solving: Slow processing, Difficulty sequencing, Requires verbal cues General Comments: Poor awareness of deficits and safety. Reports "I am fine, everything feels fine," after almost falling when going to bathroom with RN today. Some left inattention noted but able  to attend to left side with cues. pt very fixated on "grapes" and asking for grapes instead of answering questions and seemingly making light of the need to answer questions. pt very flat affect overall  Blood pressure 137/73, pulse 68, temperature 99.5 F (37.5 C), temperature source Axillary, resp. rate 20, SpO2 95 %. Physical Exam  Nursing note and vitals reviewed. Constitutional: She is oriented to person, place, and time. She appears well-developed and well-nourished.  HENT:  Right Ear: External ear normal.  Left Ear: External ear normal.  Foam dressing on right scalp.   Eyes: Right eye exhibits no discharge.  Right ptosis with lid edema. Denies visual changes.   Neck: Normal range of motion. Neck supple.  Cardiovascular: Normal rate and regular rhythm.   Respiratory: Effort normal and breath sounds normal. No stridor. She has no wheezes.  GI: Soft. Bowel sounds are normal. She exhibits no distension.  Musculoskeletal: She exhibits no edema or tenderness.  Neurological: She is oriented to person, place, and time.  Lethargic, slumped in bed.  Left inattention noted.  Impulsive and distracted needing redirection.  She is able to follow simple motor commands when awake.  Motor: (Limited by lethargy), >/ 3/5 throughout  Skin: Skin is warm and dry.  Incision with dressing c/d/i  Psychiatric: Her affect is inappropriate. She is slowed. Cognition and memory are impaired. She expresses impulsivity. She is noncommunicative. She is inattentive.    Lab Results Last 24 Hours  No results found for this or any previous visit (from the past 24 hour(s)).    Imaging Results (Last 48 hours)  Ct Head Wo Contrast  Result Date: 10/26/2016 CLINICAL DATA:  Sleepiness after aneurysm clipping. Left-sided weakness. EXAM: CT HEAD WITHOUT CONTRAST TECHNIQUE: Contiguous axial images were obtained from the base of the skull through the vertex without intravenous contrast. COMPARISON:  Yesterday  FINDINGS: Brain: Status post bilateral MCA, left PICA region, and basilar tip aneurysm clipping. Right MCA and basilar clips were placed recently. There is more well-defined low-density in the inferior right frontal and anterior temporal lobes along the MCA clip which could be from retraction. In the deep internal capsule on the right is low-density which is more concerning for infarct, potentially thalamic perforator or partial anterior choroidal. Patchy low-density in the bilateral cerebral white matter is presumably from chronic microvascular disease. Remote small infarct in the inferior left cerebellum. Postoperative pneumocephalus, blood products, and hemostasis material deep to the bone flap. No ventriculomegaly. Midline shift is mild and similar to prior at 3-4 mm. Vascular: No atherosclerotic calcification or hyperdense vessel. Skull: Craniotomy is most recently on the right with bone cement posteriorly. Sinuses/Orbits: No acute finding Other: These results were called by telephone at the time of interpretation on 10/26/2016 at 5:59 pm to [place], who verbally acknowledged these results and will communicate with Dr Cyndy Freeze. IMPRESSION: 1. Edema in the low right internal capsule is likely infarct. Inferior right frontal and anterior temporal edema may be postoperative swelling. 2. Stable expected postoperative blood products. No interval hemorrhage or ventriculomegaly. 3. Midline shift measuring up to 4  mm. Electronically Signed   By: Monte Fantasia M.D.   On: 10/26/2016 18:12     Assessment/Plan: Diagnosis:  right internal capsule infarct  Labs and images independently reviewed.  Records reviewed and summated above. Stroke: Continue secondary stroke prophylaxis and Risk Factor Modification listed below:   Blood Pressure Management:  Continue current medication with prn's with permisive HTN per primary team Tobacco abuse:   Left sided hemiparesis: fit for orthosis to prevent contractures (resting  hand splint for day, wrist cock up splint at night, PRAFO, etc)  1. Does the need for close, 24 hr/day medical supervision in concert with the patient's rehab needs make it unreasonable for this patient to be served in a less intensive setting? Yes  2. Co-Morbidities requiring supervision/potential complications: SAH due to ruptured aneurysm 12/2015 WPW/PSVT, HA, tachypnea (monitor RR and O2 Sats with increased physical exertion), HTN (monitor and provide prns in accordance with increased physical exertion and pain), hypokalemia (continue to monitor and replete as necessary), AKI (avoid nephrotoxic meds), leukocytosis (likely secondary to steroids, cont to monitor for signs and symptoms of infection, further workup if indicated), ABLA (transfuse if necessary to ensure appropriate perfusion for increased activity tolerance), lethargy (monitor as edema improves, consider medications if necessary) 3. Due to bladder management, bowel management, safety, skin/wound care, disease management, medication administration, pain management and patient education, does the patient require 24 hr/day rehab nursing? Yes 4. Does the patient require coordinated care of a physician, rehab nurse, PT (1-2 hrs/day, 5 days/week), OT (1-2 hrs/day, 5 days/week) and SLP (1-2 hrs/day, 5 days/week) to address physical and functional deficits in the context of the above medical diagnosis(es)? Yes Addressing deficits in the following areas: balance, endurance, locomotion, strength, transferring, bowel/bladder control, bathing, dressing, feeding, grooming, toileting, cognition, speech, swallowing and psychosocial support 5. Can the patient actively participate in an intensive therapy program of at least 3 hrs of therapy per day at least 5 days per week? Potentially 6. The potential for patient to make measurable gains while on inpatient rehab is excellent 7. Anticipated functional outcomes upon discharge from inpatient rehab are modified  independent and supervision  with PT, modified independent and supervision with OT, supervision and min assist with SLP. 8. Estimated rehab length of stay to reach the above functional goals is: 19-22 days, possibly less as edema resolves. 9. Does the patient have adequate social supports and living environment to accommodate these discharge functional goals? Potentially 10. Anticipated D/C setting: Home 11. Anticipated post D/C treatments: HH therapy and Home excercise program 12. Overall Rehab/Functional Prognosis: good  RECOMMENDATIONS: This patient's condition is appropriate for continued rehabilitative care in the following setting: Possibly CIR once medically appropriate.  Will need to inquire about caregiver support at discharge.  Patient has agreed to participate in recommended program. Potentially Note that insurance prior authorization may be required for reimbursement for recommended care.  Comment: Rehab Admissions Coordinator to follow up.  Delice Lesch, MD, [facility] 10/27/2016    Revision History                        Routing History

## 2016-10-29 NOTE — Progress Notes (Signed)
Physical Therapy Treatment Patient Details Name: [name] [name] MRN: [id number] DOB: 1961-04-22 Today's Date: 10/29/2016    History of Present Illness Patient is a 55 y/o admitted for aneurysm clipping for R MCA performed 10/24/16.  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 Found to have onset of left weakness 11/5; CT showed right posterior internal capsule infarct.    PT Comments    Patient continues to be very limited by Lt inattention and fatigue. Patient initially walking with min assist with RW, however with fatigue, required max assist and chair brought under her to prevent fall. (During scanning activities standing with RW, her Left leg buckled, Lt hand fell off RW grip, and she could not coordinate getting LLE to "standup."  Patient with poor awareness of deficits, including her level of fatigue.   Follow Up Recommendations  CIR     Equipment Recommendations  None recommended by PT    Recommendations for Other Services       Precautions / Restrictions Precautions Precautions: Fall Precaution Comments: high fall risk with Lt inattention and at times Lt side "turns off" (knee buckle, drops hand from RW)    Mobility  Bed Mobility Overal bed mobility: Needs Assistance Bed Mobility: Rolling;Supine to Sit Rolling: Supervision (max cues)   Supine to sit: Min guard;HOB elevated (with rail; incr effort) Sit to supine: Supervision   General bed mobility comments: getting straight on bed required max cues (verbal and tactile) for bridging and lateral scoot  Transfers Overall transfer level: Needs  assistance Equipment used: Rolling walker (2 wheeled) Transfers: Sit to/from [facility] Sit to Stand: Min assist;+2 safety/equipment Stand pivot transfers: Min assist;+2 safety/equipment       General transfer comment: vc for safe use of RW; assist for balance and hands-on for safety if LLE buckles  Ambulation/Gait Ambulation/Gait assistance: Min assist;Max assist;+2 physical assistance;+2 safety/equipment Ambulation Distance (Feet): 60 Feet Assistive device: Rolling walker (2 wheeled) Gait Pattern/deviations: Step-through pattern;Decreased stride length;Decreased weight shift to right;Drifts right/left;Wide base of support     General Gait Details: Short shuffling steps with instability noted LLE; gripping RW with Lt hand well initially, and with inattention, began to lose her grip as walking; consistenlty running into objects on left side with assist to move RW around object; with standing rest and visual scanning activities, pt"s lt side suddenly "turned off" Lt knee buckling (but maintaining tone), lt hand fell off RW and pt began falling to her left with PT and wall/window supporting her while OT brought chair to allow her to sit (near fall)   Stairs            Wheelchair Mobility    Modified Rankin (Stroke Patients Only)       Balance Overall balance assessment: Needs assistance       Postural control: Left lateral lean Standing balance support: Bilateral upper extremity supported Standing balance-Leahy Scale: Zero Standing balance comment: at worst, zero                    Cognition Arousal/Alertness: Awake/alert Behavior During Therapy: Impulsive Overall Cognitive Status: Impaired/Different from baseline   Orientation Level: Place (at first oriented to place; at end of  session not)   Memory: Decreased recall of precautions Following Commands: Follows one step commands with increased time Safety/Judgement: Decreased awareness of  safety;Decreased awareness of deficits Awareness: Intellectual Problem Solving: Slow processing;Difficulty sequencing;Requires verbal cues;Requires tactile cues General Comments: Poor awareness of deficits and safety. Reports "I am fine, everything feels fine," (including after near fall with PT/OT this session. Ater left pt up in chair with alarm, within 1 minute she was standing alone going back to bed (alarm sounding)    Exercises      General Comments        Pertinent Vitals/Pain Pain Assessment: No/denies pain    Home Living                      Prior Function            PT Goals (current goals can now be found in the care plan section) Acute Rehab PT Goals Patient Stated Goal: To return to independent Time For Goal Achievement: 11/01/16 Progress towards PT goals: Progressing toward goals    Frequency    Min 3X/week      PT Plan Current plan remains appropriate    Co-evaluation PT/OT/SLP Co-Evaluation/Treatment: Yes Reason for Co-Treatment: Complexity of the patient's impairments (multi-system involvement);For patient/therapist safety PT goals addressed during session: Mobility/safety with mobility;Balance;Proper use of DME       End of Session Equipment Utilized During Treatment: Gait belt Activity Tolerance: Patient limited by fatigue Patient left: in bed;with call bell/phone within reach;with bed alarm set (after patient got up from chair alone, returned to bed)     Time: [id number] PT Time Calculation (min) (ACUTE ONLY): 37 min  Charges:  $Gait Training: 8-22 mins                    G Codes:      [name] [name] Nov 12, 2016, 12:06 PM Pager [phone]

## 2016-10-29 NOTE — H&P (Signed)
Physical Medicine and Rehabilitation Admission H&P    CC: Left sided weakness, cognitive deficits and left inattention   HPI:  [name] [name] is a 55 y.o. female with history of SAH due to ruptured aneurysm 12/2015 WPW/PSVT, HA; who was admitted on 10/24/16 for clipping of B-MCA, L-PICA and basial tip  aneurysms by Dr. Cyndy Freeze. History taken from chart review. Post op developed left sided weakness and CT head done revealing right internal capsule infarct with right frontal and anterior temporal edema likely due to post op swelling and stable post op blood products.  Mentation is improving with improvement in left sided weakness, however, continues to have left neglect with right lean. Therapy ongoing and CIR recommended for follow up therapy.    Review of Systems  Constitutional: Negative for diaphoresis.  HENT: Negative for hearing loss and tinnitus.   Eyes: Negative for blurred vision and double vision.  Respiratory: Negative for cough and shortness of breath.   Cardiovascular: Negative for chest pain and palpitations.  Gastrointestinal: Negative for heartburn and nausea.  Genitourinary: Negative for dysuria and urgency.  Musculoskeletal: Negative for back pain, myalgias and neck pain.  Skin: Negative for itching and rash.  Neurological: Negative for dizziness, sensory change, weakness and headaches.  Psychiatric/Behavioral: Negative for memory loss. The patient does not have insomnia.   All other systems reviewed and are negative.   Past Medical History:  Diagnosis Date  . Arthritis   . Brain aneurysm   . Headache   . Hypertension    QUIT TAKING BP MEDS NONE IN 10+ YEARS  . 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 Cristopher Peru)  . Thyroid disease    PROCEDURE FOR THYROID 15 YRS AGO AT [facility]  . WPW (Wolff-Parkinson-White syndrome)     Past Surgical History:  Procedure Laterality Date  . BREAST SURGERY     BX  RT BREAST  BENIGN  . CRANIOTOMY N/A 01/04/2016   Procedure: Suboccipital Craniotomy and Cervical one Laminectomy for Clipping of Aneurysm;  Surgeon: Kevan Ny Ditty, MD;  Location: [place] [facility];  Service: Neurosurgery;  Laterality: N/A;  . CRANIOTOMY N/A 07/09/2016   Procedure: Craniotomy for clipping of left middle cerebral artery aneurysm;  Surgeon: Kevan Ny Ditty, MD;  Location: [place] [facility];  Service: Neurosurgery;  Laterality: N/A;  Craniotomy for clipping of left middle cerebral artery aneurysm  . 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 Christella Noa;  Surgeon: Kevan Ny Ditty, MD;  Location: [place];  Service: Neurosurgery;  Laterality: Right;  . ELECTROPHYSIOLOGIC STUDY    . LAPAROSCOPIC REVISION VENTRICULAR-PERITONEAL (V-P) SHUNT N/A 02/04/2016   Procedure: LAPAROSCOPIC Insertion VENTRICULAR-PERITONEAL (V-P) SHUNT;  Surgeon: Rolm Bookbinder, MD;  Location: [place] [facility];  Service: General;  Laterality: N/A;  . RADIOLOGY WITH ANESTHESIA N/A 01/03/2016   Procedure: RADIOLOGY WITH ANESTHESIA;  Surgeon: Medication Radiologist, MD;  Location: [place];  Service: Radiology;  Laterality: N/A;  . VENTRICULOPERITONEAL SHUNT Right 02/04/2016   Procedure: SHUNT INSERTION VENTRICULAR-PERITONEAL With Laparoscopic Assistance;  Surgeon: Kevan Ny Ditty, MD;  Location: [place] [facility];  Service: Neurosurgery;  Laterality: Right;    History reviewed. No pertinent family history. per chart review.  Social History:  Lives with mother. 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 She reports that she has been smoking.  She has been smoking about 0.50 packs per  day. She has never used smokeless tobacco. She reports that she does not drink alcohol or use drugs.    Allergies  Allergen Reactions  . No Known Allergies     Medications Prior to Admission  Medication Sig Dispense Refill  .  acetaminophen (TYLENOL) 500 MG tablet Take 1,000 mg by mouth daily as needed for moderate pain or headache.    . lovastatin (MEVACOR) 20 MG tablet Take 20 mg by mouth at bedtime.    . [DISCONTINUED] traMADol (ULTRAM) 50 MG tablet Take 1 tablet (50 mg total) by mouth every 6 (six) hours as needed. 15 tablet 0  . Diclofenac Sodium 3 % GEL Apply to shoulder bid (Patient not taking: Reported on 10/15/2016) 50 g 1  . docusate sodium (COLACE) 100 MG capsule Take 1 capsule (100 mg total) by mouth 2 (two) times daily. (Patient not taking: Reported on 10/15/2016) 10 capsule 0  . HYDROcodone-acetaminophen (NORCO/VICODIN) 5-325 MG tablet Take 1 tablet by mouth every 4 (four) hours as needed for moderate pain. (Patient not taking: Reported on 10/14/2016) 60 tablet 0  . ibuprofen (ADVIL,MOTRIN) 200 MG tablet Take 400 mg by mouth every 8 (eight) hours as needed for headache or moderate pain.    Marland Kitchen levETIRAcetam (KEPPRA) 750 MG tablet Take 1 tablet (750 mg total) by mouth 2 (two) times daily. (Patient not taking: Reported on 10/14/2016) 60 tablet 2    Home: [place] expects to be discharged to:: Private residence Living Arrangements: Parent Available Help at Discharge: Family, Available 24 hours/day Type of Home: House Home Access: Stairs to enter [employer] of Steps: 2 Home Layout: One level Bathroom Shower/Tub: Curtain, Tub only Biochemist, clinical: Standard Home Equipment: Environmental consultant - 2 wheels, Shower seat, Bedside commode Additional Comments: Pt lives w/ mother and 34 y/o grandson.  Per pt mother is in good physical health to assist pt at d/c. ex husband present at OT evaluation and reports he is visiting due to patients mother does not drive.    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 Per Ex husband patient has not worked recently due to medical issues 06/2016   Functional Status:    Mobility: Bed Mobility Overal bed mobility: Needs Assistance Bed Mobility: Rolling, Supine to Sit Rolling: Supervision (max cues) Supine to sit: Min guard, HOB elevated (with rail; incr effort) Sit to supine: Supervision General bed mobility comments: getting straight on bed required max cues (verbal and tactile) for bridging and lateral scoot Transfers Overall transfer level: Needs assistance Equipment used: Rolling walker (2 wheeled) Transfers: Sit to/from Stand, [employer] Transfers Sit to Stand: Min assist, +2 safety/equipment Stand pivot transfers: Min assist, +2 safety/equipment General transfer comment: vc for safe use of RW; assist for balance and hands-on for safety if LLE buckles Ambulation/Gait Ambulation/Gait assistance: Min assist, Max assist, +2 physical assistance, +2 safety/equipment Ambulation Distance (Feet): 60 Feet Assistive device: Rolling walker (2 wheeled) Gait Pattern/deviations: Step-through pattern, Decreased stride length, Decreased weight shift to right, Drifts right/left, Wide base of support General Gait Details: Short shuffling steps with instability noted LLE; gripping RW with Lt hand well initially, and with inattention, began to lose her grip as walking; consistenlty running into objects on left side with assist to move RW around object; with standing rest and visual scanning activities, pt"s lt side suddenly "turned off" Lt knee buckling (but maintaining tone), lt hand fell off RW and pt began falling to  her left with PT and wall/window supporting her while OT brought chair to allow her to sit (near fall) Gait velocity: decreased Gait velocity interpretation: Below normal speed for age/gender    ADL: ADL Overall ADL's : Needs assistance/impaired Eating/Feeding: Set up Grooming: Wash/dry face, Min guard Lower Body Dressing: Maximal assistance Lower Body Dressing Details (indicate cue type and reason): pt able to left foot off bed and able to cross  bil LE but unable to don sock. decr sequencing task  Functional mobility during ADLs: +2 for physical assistance, Minimal assistance General ADL Comments: pt requires cues to use L side and attend to L  Cognition: Cognition Overall Cognitive Status: Impaired/Different from baseline Orientation Level: Oriented to person, Oriented to place, Oriented to situation Cognition Arousal/Alertness: Awake/alert Behavior During Therapy: Impulsive Overall Cognitive Status: Impaired/Different from baseline Area of Impairment: Following commands, Safety/judgement, Problem solving, Awareness Orientation Level: Place (at first oriented to place; at end of session not) Memory: Decreased recall of precautions Following Commands: Follows one step commands with increased time Safety/Judgement: Decreased awareness of safety, Decreased awareness of deficits Awareness: Intellectual Problem Solving: Slow processing, Difficulty sequencing, Requires verbal cues, Requires tactile cues General Comments: Poor awareness of deficits and safety. Reports "I am fine, everything feels fine," (including after near fall with PT/OT this session. Ater left pt up in chair with alarm, within 1 minute she was standing alone going back to bed (alarm sounding)   Blood pressure (!) 111/56, pulse 65, temperature 98.7 F (37.1 C), temperature source Oral, resp. rate 20, SpO2 99 %. Physical Exam  Nursing note and vitals reviewed. Constitutional: She is oriented to person, place, and time. She appears well-developed and well-nourished.  Keeps head in pillow face down  HENT:  Head: Normocephalic.  Mouth/Throat: Oropharynx is clear and moist.  Well healed old left crani incision and right crani incision C/D/I with staples in place.   Eyes: Conjunctivae are normal. Right eye exhibits no discharge. Left eye exhibits no discharge.  Right ptosis.  Neck: Normal range of motion. Neck supple.  Cardiovascular: Normal rate and regular  rhythm.   Respiratory: Effort normal and breath sounds normal. No respiratory distress.  GI: Soft. Bowel sounds are normal. She exhibits no distension. There is no tenderness.  Musculoskeletal: She exhibits no edema or tenderness.  No edema or tenderness in extremitites  Neurological: She is alert and oriented to person, place, and time.  Left inattention.  Right ptosis and left facial weakness.  Does not have good awareness of deficits.  Motor: 4+/5 grossly throughout  Skin: Skin is warm and dry.  Psychiatric: Her affect is blunt. Her speech is delayed. She is slowed.    No results found for this or any previous visit (from the past 48 hour(s)). No results found.     Medical Problem List and Plan: 1.  Left sided weakness, cognitive deficits and severe left inattention secondary to right internal capsule infarct after aneurysm clippings with hx of SAH due to ruptured aneurysms.  2.  DVT Prophylaxis/Anticoagulation: Mechanical: Sequential compression devices, below knee Bilateral lower extremities 3. Pain Management: Will continue Hydrocodone prn for now--denies any pain.  4. Mood: LCSW to follow for evaluation and support.  5. Neuropsych: This patient is not fully capable of making decisions on her own behalf. 6. Skin/Wound Care: Monitor incision for healing. Maintain adequate nutritional and hydration status.  7. Fluids/Electrolytes/Nutrition: Monitor I/O. Check lytes in am  8. Seizure prophylaxis: On keppra.  9. H/o SVT/WPW s/p ablation: Monitor HR  bid--in NSR.  10. Hypokalemia: Follow BMP 11. Leukocytosis: Follow CBC.. 12. AKI: Avoid nephrotoxic meds. Follow BMP 13. ABLA: Follow CBC.   Post Admission Physician Evaluation: 1. Functional deficits secondary  to right internal capsule infarct. 2. Patient is admitted to receive collaborative, interdisciplinary care between the physiatrist, rehab nursing staff, and therapy team. 3. Patient's level of medical complexity and  substantial therapy needs in context of that medical necessity cannot be provided at a lesser intensity of care such as a SNF. 4. Patient has experienced substantial functional loss from his/her baseline which was documented above under the "Functional History" and "Functional Status" headings.  Judging by the patient's diagnosis, physical exam, and functional history, the patient has potential for functional progress which will result in measurable gains while on inpatient rehab.  These gains will be of substantial and practical use upon discharge  in facilitating mobility and self-care at the household level. 5. Physiatrist will provide 24 hour management of medical needs as well as oversight of the therapy plan/treatment and provide guidance as appropriate regarding the interaction of the two. 6. 24 hour rehab nursing will assist with bladder management, safety, skin/wound care, medication administration, pain management and patient education  and help integrate therapy concepts, techniques,education, etc. 7. PT will assess and treat for/with: Lower extremity strength, range of motion, stamina, balance, functional mobility, safety, adaptive techniques and equipment, woundcare, coping skills, pain control, stroke education.   Goals are: Mod I/Supervision. 8. OT will assess and treat for/with: ADL's, functional mobility, safety, upper extremity strength, adaptive techniques and equipment, wound mgt, ego support, and community reintegration.   Goals are: Mod I/Supervision. Therapy may not proceed with showering this patient. 9. SLP will assess and treat for/with: cognition.  Goals are: Supervision/Mod I. 10. Case Management and Social Worker will assess and treat for psychological issues and discharge planning. 11. Team conference will be held weekly to assess progress toward goals and to determine barriers to discharge. 12. Patient will receive at least 3 hours of therapy per day at least 5 days per  week. 13. ELOS: 10-15 days.       14. Prognosis:  good  Delice Lesch, MD, Mellody Drown 10/29/2016

## 2016-10-29 NOTE — PMR Pre-admission (Signed)
PMR Admission Coordinator Pre-Admission Assessment  Patient: [name] [name] is an 55 y.o., female MRN: [id number] DOB: [birth year] Height: 5\' 3"  (160 cm) Weight: 89.4 kg (197 lb)              Insurance Information HMO: No    PPO:       PCP:       IPA:       80/20:       OTHER:   PRIMARY:  Medicaid [place] access      Policy#: [id number]      Subscriber: Hollie [place] CM Name:        Phone#:       Fax#:   Pre-Cert#:        Employer:  Not working for the past few months Benefits:  Phone #: [phone]     Name:  Automated Eff. Date: Eligible 10/28/16 with coverage code [facility]     Deduct:        Out of Pocket Max:        Life Max:   CIR:        SNF:   Outpatient:       Co-Pay:   Home Health:        Co-Pay:   DME:       Co-Pay:   Providers:   Medicaid Application Date:        Case Manager:   Disability Application Date:        Case Worker:    Emergency Contact Information Contact Information    Name Relation Home Work Mobile   [place] Other [phone]     [name] Mother [phone]       Current Medical History  Patient Admitting Diagnosis:  R IC infarct post craniotomy for aneurysm clippings  History of Present Illness: A 55 y.o. female with history of [place] due to ruptured aneurysm 12/2015 WPW/PSVT, HA; who was admitted on 10/24/16 for clipping of B-MCA, L-PICA and basial tip aneurysms by Dr. Cyndy Freeze.  History taken from chart review.  Post op developed left sided weakness and CT head done revealing right internal capsule infarct with right frontal and anterior temporal edema likely due to post op swelling and stable post op blood products. Pt remains very lethargic only keeping eyes open from brief periods of time.  Plan for pt to remain in ICU at present and to continue ASA and albumin.    Total: 6=NIH  Past Medical History  Past Medical History:  Diagnosis Date  . Arthritis   . Brain aneurysm   . Headache   . Hypertension    QUIT TAKING BP MEDS NONE IN 10+ YEARS   . 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 Cristopher Peru)  . Thyroid disease    PROCEDURE FOR THYROID 15 YRS AGO AT [facility]  . WPW (Wolff-Parkinson-White syndrome)     Family History  family history is not on file.  Prior Rehab/Hospitalizations: Had [place] therapies with [facility] this past year after surgeries for aneurysm clippings  Has the patient had major surgery during 100 days prior to admission? Yes.  Had surgery July 09, 2016  Current Medications   Current Facility-Administered Medications:  .  0.9 %  sodium chloride infusion, , Intravenous, Continuous, Kevan Ny Ditty, MD, Last Rate: 100 mL/hr at 10/28/16 2236 .  acetaminophen (TYLENOL) tablet 1,000 mg, 1,000 mg, Oral, Daily PRN, Kevan Ny Ditty, MD .  bisacodyl (DULCOLAX) EC tablet 5 mg, 5  mg, Oral, Daily PRN, Kevan Ny Ditty, MD .  dexamethasone (DECADRON) injection 2 mg, 2 mg, Intravenous, Q6H, Kevan Ny Ditty, MD, 2 mg at 10/29/16 1232 .  docusate sodium (COLACE) capsule 100 mg, 100 mg, Oral, BID, Kevan Ny Ditty, MD, 100 mg at 10/29/16 1022 .  hydrALAZINE (APRESOLINE) injection 5-10 mg, 5-10 mg, Intravenous, Q1H PRN, Kevan Ny Ditty, MD, 5 mg at 10/26/16 1027 .  HYDROcodone-acetaminophen (NORCO/VICODIN) 5-325 MG per tablet 1 tablet, 1 tablet, Oral, Q4H PRN, Kevan Ny Ditty, MD, 1 tablet at 10/26/16 1233 .  HYDROmorphone (DILAUDID) injection 0.5-1 mg, 0.5-1 mg, Intravenous, Q2H PRN, Kevan Ny Ditty, MD, 1 mg at 10/25/16 1226 .  levETIRAcetam (KEPPRA) tablet 750 mg, 750 mg, Oral, BID, Kevan Ny Ditty, MD, 750 mg at 10/29/16 1018 .  naloxone (NARCAN) injection 0.08 mg, 0.08 mg, Intravenous, PRN, Kevan Ny Ditty, MD .  ondansetron [facility]) tablet 4 mg, 4 mg, Oral, Q4H PRN **OR** ondansetron (ZOFRAN) injection 4 mg, 4 mg, Intravenous, Q4H PRN, Kevan Ny Ditty, MD .  pantoprazole (PROTONIX) EC tablet 20 mg, 20 mg, Oral,  Daily, Kevan Ny Ditty, MD, 20 mg at 10/29/16 1023 .  pravastatin (PRAVACHOL) tablet 20 mg, 20 mg, Oral, q1800, Kevan Ny Ditty, MD, 20 mg at 10/28/16 1714 .  senna (SENOKOT) tablet 8.6 mg, 1 tablet, Oral, BID, Kevan Ny Ditty, MD, 8.6 mg at 10/29/16 1018 .  sodium phosphate (FLEET) 7-19 GM/118ML enema 1 enema, 1 enema, Rectal, Once PRN, Tamala Fothergill, MD  Patients Current Diet: Diet Heart Room service appropriate? Yes; Fluid consistency: Thin Diet - low sodium heart healthy  Precautions / Restrictions Precautions Precautions: Fall Precaution Comments: high fall risk with Lt inattention and at times Lt side "turns off" (knee buckle, drops hand from RW) Restrictions Weight Bearing Restrictions: No   Has the patient had 2 or more falls or a fall with injury in the past year?No  Prior Activity Level Community (5-7x/wk): Martin Majestic out daily, drives as needed  Development worker, international aid / [place] Devices/Equipment: None Home Equipment: Environmental consultant - 2 wheels, Shower seat, Bedside commode  Prior Device Use: Indicate devices/aids used by the patient prior to current illness, exacerbation or injury? None  Prior Functional Level Prior Function Level of Independence: Independent 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 Per Ex husband patient has not worked recently due to medical issues 06/2016   Self Care: Did the patient need help bathing, dressing, using the toilet or eating?  Independent  Indoor Mobility: Did the patient need assistance with walking from room to room (with or without device)? Independent  Stairs: Did the patient need assistance with internal or external stairs (with or without device)? Independent  Functional Cognition: Did the patient need help planning regular tasks such as shopping or remembering to take medications? Independent  Current Functional Level Cognition  Overall Cognitive Status: Impaired/Different from  baseline Current Attention Level: Sustained Orientation Level: (P) Oriented to person, Oriented to place, Oriented to situation Following Commands: Follows one step commands with increased time Safety/Judgement: Decreased awareness of safety, Decreased awareness of deficits General Comments: L inattention and poor awareness to deficits. pt standing and attempting to get into bed with chair alarm sounding to not get up by yourself. Pt with near fall in hallway with therapist and states " im fine everything is fine"     Extremity Assessment (includes Sensation/Coordination)  Upper Extremity Assessment: LUE deficits/detail LUE Deficits / Details: decreased grip compared to  R.pt demonstrates inattention but when asked to reach for grapes pt demonstrates use.pt demonstrates decr proprioception   Lower Extremity Assessment: Defer to PT evaluation    ADLs  Overall ADL's : Needs assistance/impaired Eating/Feeding: Set up, Bed level Eating/Feeding Details (indicate cue type and reason): pt requesting grapes and OT calling to make sure lunch tray arrives with cups of grapes Grooming: Wash/dry hands, Minimal assistance, Standing Grooming Details (indicate cue type and reason): pt able to feel water as warm on L hand . pt requires max cues to locate soap on L side of sink. pt finished washing hands and neglected to turn off water. Pt attempting to turn off water using R faucet knob only and needed cue to locate L  Lower Body Dressing: Maximal assistance Lower Body Dressing Details (indicate cue type and reason): pt able to left foot off bed and able to cross bil LE but unable to don sock. decr sequencing task  Toilet Transfer: Minimal assistance Toileting- Clothing Manipulation and Hygiene: Moderate assistance Functional mobility during ADLs: Minimal assistance General ADL Comments: pt using RW appropriate bed to bathroom. pt once in hallway walking into objects on L side and L hand starting to slide off  RW handle. pt progressed to tall windowns leading into the [place] tower for visual scanning. pt needed max cues to locate items. Pt suddenly with L Inattention and L side buckle with total +2 (A) to remain standing    Mobility  Overal bed mobility: Needs Assistance Bed Mobility: Rolling, Supine to Sit Rolling: Supervision Supine to sit: Min guard, HOB elevated Sit to supine: Supervision General bed mobility comments: getting straight on bed required max cues (verbal and tactile) for bridging and lateral scoot    Transfers  Overall transfer level: Needs assistance Equipment used: Rolling walker (2 wheeled) Transfers: Sit to/from Stand Sit to Stand: Min assist, +2 safety/equipment Stand pivot transfers: Min assist, +2 safety/equipment General transfer comment: vc for safe use of RW; assist for balance and hands-on for safety if LLE buckles    Ambulation / Gait / Stairs / Wheelchair Mobility  Ambulation/Gait Ambulation/Gait assistance: Min assist, Max assist, +2 physical assistance, +2 safety/equipment Ambulation Distance (Feet): 60 Feet Assistive device: Rolling walker (2 wheeled) Gait Pattern/deviations: Step-through pattern, Decreased stride length, Decreased weight shift to right, Drifts right/left, Wide base of support General Gait Details: Short shuffling steps with instability noted LLE; gripping RW with Lt hand well initially, and with inattention, began to lose her grip as walking; consistenlty running into objects on left side with assist to move RW around object; with standing rest and visual scanning activities, pt"s lt side suddenly "turned off" Lt knee buckling (but maintaining tone), lt hand fell off RW and pt began falling to her left with PT and wall/window supporting her while OT brought chair to allow her to sit (near fall) Gait velocity: decreased Gait velocity interpretation: Below normal speed for age/gender    Posture / Balance Dynamic Sitting Balance Sitting balance -  Comments: b Balance Overall balance assessment: Needs assistance Sitting-balance support: Single extremity supported, Feet supported Sitting balance-Leahy Scale: Fair Sitting balance - Comments: b Postural control: Left lateral lean Standing balance support: Bilateral upper extremity supported, During functional activity Standing balance-Leahy Scale: Zero Standing balance comment: zero at worst    Special needs/care consideration BiPAP/CPAP No CPM No Continuous Drip IV 0.9% NS 100 mL/hr Dialysis No        Life Vest No Oxygen No Special Bed No Trach Size No Wound  Vac (area) No  Skin Scalp incision with staples                            Bowel mgmt: No documented BM since admission Bladder mgmt: Urgency, voiding in bathroom with assistance Diabetic mgmt No    Previous Home Environment Living Arrangements: Parent Available Help at Discharge: Family, Available 24 hours/day Type of Home: House Home Layout: One level Home Access: Stairs to enter [employer] of Steps: 2 Bathroom Shower/Tub: Curtain, Tub only Biochemist, clinical: Standard Home Care Services: No Additional Comments: Pt lives w/ mother and 64 y/o grandson.  Per pt mother is in good physical health to assist pt at d/c. ex husband present at OT evaluation and reports he is visiting due to patients mother does not drive.   Discharge Living Setting Plans for Discharge Living Setting: House, Lives with (comment) (Lives with mom and 73 yo grandson.) Type of Home at Discharge: House Discharge Home Layout: One level Discharge Home Access: Stairs to enter Entrance Stairs-Number of Steps: 2 steps Does the patient have any problems obtaining your medications?: No  Social/Family/Support Systems Patient Roles: Spouse, Other (Comment) (Has ex-husband and mom and a 21 yo grandson.) Contact Information: Berniece Andreas - ex-husband - [phone] Anticipated Caregiver: mom Anticipated Caregiver's Contact Information: [name] - mother - [phone] Ability/Limitations of Caregiver: Mom can assist.  Ex-husband checks in weekly. Caregiver Availability: 24/7 Discharge Plan Discussed with Primary Caregiver: Yes Is Caregiver In Agreement with Plan?: Yes Does Caregiver/Family have Issues with Lodging/Transportation while Pt is in Rehab?: No  Goals/Additional Needs Patient/Family Goal for Rehab: PT/OT mod I and supervision, SLP supervision and min assist goals Expected length of stay: 19-22 days Cultural Considerations: None Dietary Needs: Heart diet, thin liquids Equipment Needs: TBD Pt/Family Agrees to Admission and willing to participate: Yes (I spoke with patient's ex-husband at the bedside.) Program Orientation Provided & Reviewed with Pt/Caregiver Including Roles  & Responsibilities: Yes  Decrease burden of Care through IP rehab admission: N/A  Possible need for SNF placement upon discharge: Not anticipated  Patient Condition: This patient's medical and functional status has changed since the consult dated: 10/27/16 in which the Rehabilitation Physician determined and documented that the patient's condition is appropriate for intensive rehabilitative care in an inpatient rehabilitation facility. See "History of Present Illness" (above) for medical update. Functional changes are: Currently requiring min to max assist to ambulate 60 feet RW. Patient's medical and functional status update has been discussed with the Rehabilitation physician and patient remains appropriate for inpatient rehabilitation. Will admit to inpatient rehab today.  Preadmission Screen Completed By:  Retta Diones, 10/29/2016 3:31 PM ______________________________________________________________________   Discussed status with Dr. Posey Pronto on 10/29/16 at 1530 and received telephone approval for admission today.  Admission Coordinator:  Retta Diones, time1530/Date11/08/17

## 2016-10-29 NOTE — Care Management Note (Signed)
Case Management Note  Patient Details  Name: [name] [name] MRN: [id number] Date of Birth: [birth year]  Subjective/Objective:                    Action/Plan: Pt discharging to CIR today. No further needs per CM.   Expected Discharge Date:                  Expected Discharge Plan:  [place]  In-House Referral:     Discharge planning Services     Post Acute Care Choice:    Choice offered to:     DME Arranged:    DME Agency:     HH Arranged:    [place] Agency:     Status of Service:  Completed, signed off  If discussed at [employer] of Stay Meetings, dates discussed:    Additional Comments:  Pollie Friar, RN 10/29/2016, 3:07 PM

## 2016-10-29 NOTE — Progress Notes (Signed)
Patient ID: [name] [name], female   DOB: 01-12-1961, 55 y.o.   MRN: [id number] Patient admitted to [phone] via bed, escorted by nursing staff.  Patient verbalized understanding of rehab process, signed fall safety agreement.  Patient is alert but drowsy, easy to arouse.  Appears to be in no immediate distress at this time.  Brita Romp, RN

## 2016-10-30 ENCOUNTER — Inpatient Hospital Stay (HOSPITAL_COMMUNITY): Payer: Medicaid Other

## 2016-10-30 ENCOUNTER — Inpatient Hospital Stay (HOSPITAL_COMMUNITY): Payer: Medicaid Other | Admitting: Occupational Therapy

## 2016-10-30 ENCOUNTER — Inpatient Hospital Stay (HOSPITAL_COMMUNITY): Payer: Medicaid Other | Admitting: Speech Pathology

## 2016-10-30 DIAGNOSIS — D62 Acute posthemorrhagic anemia: Secondary | ICD-10-CM

## 2016-10-30 DIAGNOSIS — N179 Acute kidney failure, unspecified: Secondary | ICD-10-CM

## 2016-10-30 DIAGNOSIS — D72820 Lymphocytosis (symptomatic): Secondary | ICD-10-CM

## 2016-10-30 DIAGNOSIS — I63411 Cerebral infarction due to embolism of right middle cerebral artery: Secondary | ICD-10-CM

## 2016-10-30 DIAGNOSIS — R001 Bradycardia, unspecified: Secondary | ICD-10-CM

## 2016-10-30 LAB — CBC WITH DIFFERENTIAL/PLATELET
Basophils Absolute: 0 10*3/uL (ref 0.0–0.1)
Basophils Relative: 0 %
Eosinophils Absolute: 0 10*3/uL (ref 0.0–0.7)
Eosinophils Relative: 0 %
HCT: 39.5 % (ref 36.0–46.0)
Hemoglobin: 12.8 g/dL (ref 12.0–15.0)
Lymphocytes Relative: 21 %
Lymphs Abs: 2.5 10*3/uL (ref 0.7–4.0)
MCH: 26.9 pg (ref 26.0–34.0)
MCHC: 32.4 g/dL (ref 30.0–36.0)
MCV: 83.2 fL (ref 78.0–100.0)
Monocytes Absolute: 1.3 10*3/uL — ABNORMAL HIGH (ref 0.1–1.0)
Monocytes Relative: 11 %
Neutro Abs: 8.2 10*3/uL — ABNORMAL HIGH (ref 1.7–7.7)
Neutrophils Relative %: 68 %
Platelets: 319 10*3/uL (ref 150–400)
RBC: 4.75 MIL/uL (ref 3.87–5.11)
RDW: 13.7 % (ref 11.5–15.5)
WBC: 11.9 10*3/uL — ABNORMAL HIGH (ref 4.0–10.5)

## 2016-10-30 LAB — COMPREHENSIVE METABOLIC PANEL
ALT: 35 U/L (ref 14–54)
AST: 22 U/L (ref 15–41)
Albumin: 3.3 g/dL — ABNORMAL LOW (ref 3.5–5.0)
Alkaline Phosphatase: 40 U/L (ref 38–126)
Anion gap: 8 (ref 5–15)
BUN: 21 mg/dL — ABNORMAL HIGH (ref 6–20)
CO2: 21 mmol/L — ABNORMAL LOW (ref 22–32)
Calcium: 9.2 mg/dL (ref 8.9–10.3)
Chloride: 108 mmol/L (ref 101–111)
Creatinine, Ser: 1.11 mg/dL — ABNORMAL HIGH (ref 0.44–1.00)
GFR calc Af Amer: >60 mL/min (ref >60)
GFR calc non Af Amer: 55 mL/min — ABNORMAL LOW (ref >60)
Glucose, Bld: 100 mg/dL — ABNORMAL HIGH (ref 65–99)
Potassium: 4.3 mmol/L (ref 3.5–5.1)
Sodium: 137 mmol/L (ref 135–145)
Total Bilirubin: 0.6 mg/dL (ref 0.3–1.2)
Total Protein: 6.5 g/dL (ref 6.5–8.1)

## 2016-10-30 MED ORDER — POLYETHYLENE GLYCOL 3350 17 G PO PACK
17.0000 g | PACK | Freq: Two times a day (BID) | ORAL | Status: DC | PRN
  Filled 2016-10-30: qty 1

## 2016-10-30 MED ORDER — SENNOSIDES-DOCUSATE SODIUM 8.6-50 MG PO TABS
2.0000 | ORAL_TABLET | Freq: Two times a day (BID) | ORAL | Status: DC
  Administered 2016-10-30 – 2016-11-08 (×18): 2 via ORAL
  Filled 2016-10-30 (×18): qty 2

## 2016-10-30 NOTE — Progress Notes (Signed)
Occupational Therapy Assessment and Plan  Patient Details  Name: [name] [name] MRN: [id number] Date of Birth: 1961/05/14  OT Diagnosis: abnormal posture, cognitive deficits and muscle weakness (generalized) Rehab Potential: Rehab Potential (ACUTE ONLY): Fair ELOS: 10-14 days   Today's Date: 10/30/2016 OT Individual Time: 0800-0900 OT Individual Time Calculation (min): 60 min      Problem List: Patient Active Problem List   Diagnosis Date Noted  . CVA (cerebral vascular accident) ([place]) 10/29/2016  . Stroke due to embolism of middle cerebral artery ([place]) 10/29/2016  . Seizure prophylaxis   . History of supraventricular tachycardia   . Neurologic abnormality   . Stroke syndrome ([place])   . Vascular headache   . Other secondary hypertension   . Hypokalemia   . AKI (acute kidney injury) ([place])   . Lymphocytosis   . Lethargy   . Brain edema ([place])   . Intracranial aneurysm 07/09/2016  . Communicating hydrocephalus 02/03/2016  . Essential hypertension   . Paroxysmal SVT (supraventricular tachycardia) ([place])   . Paroxysmal atrial fibrillation (HCC)   . Tobacco abuse   . Tachypnea   . Bradycardia   . Leukocytosis   . Acute blood loss anemia   . Thrombocytosis ([place])   . Chest pain   . Nontraumatic subarachnoid hemorrhage ([place])   . Acute respiratory failure ([place])   . Encounter for central line placement   . Hypoxemia   . Renal insufficiency   . Encounter for imaging study to confirm nasogastric (NG) tube placement   . Endotracheally intubated   . Respiratory failure ([place])   . SAH (subarachnoid hemorrhage) ([place])   . Subarachnoid hemorrhage due to ruptured aneurysm ([place]) 01/03/2016  . Subarachnoid hemorrhage ([place]) 01/03/2016  . Atrial fibrillation with rapid ventricular response ([place])   . Elevated lactic acid level   . SMOKER 03/20/2009  . HYPERTENSION, UNSPECIFIED 03/20/2009  . WPW 03/20/2009  . SVT/ PSVT/ PAT 03/20/2009  . BRONCHITIS 03/20/2009    Past Medical History:   Past Medical History:  Diagnosis Date  . Arthritis   . Brain aneurysm   . Headache   . Hypertension    QUIT TAKING BP MEDS NONE IN 10+ YEARS  . 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 Cristopher Peru)  . Thyroid disease    PROCEDURE FOR THYROID 15 YRS AGO AT [facility]  . WPW (Wolff-Parkinson-White syndrome)    Past Surgical History:  Past Surgical History:  Procedure Laterality Date  . BREAST SURGERY     BX RT BREAST  BENIGN  . CRANIOTOMY N/A 01/04/2016   Procedure: Suboccipital Craniotomy and Cervical one Laminectomy for Clipping of Aneurysm;  Surgeon: Kevan Ny Ditty, MD;  Location: [facility];  Service: Neurosurgery;  Laterality: N/A;  . CRANIOTOMY N/A 07/09/2016   Procedure: Craniotomy for clipping of left middle cerebral artery aneurysm;  Surgeon: Kevan Ny Ditty, MD;  Location: [facility];  Service: Neurosurgery;  Laterality: N/A;  Craniotomy for clipping of left middle cerebral artery aneurysm  . 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 Christella Noa;  Surgeon: Kevan Ny Ditty, MD;  Location: [employer];  Service: Neurosurgery;  Laterality: Right;  . ELECTROPHYSIOLOGIC STUDY    . LAPAROSCOPIC REVISION VENTRICULAR-PERITONEAL (V-P) SHUNT N/A 02/04/2016   Procedure: LAPAROSCOPIC Insertion VENTRICULAR-PERITONEAL (V-P) SHUNT;  Surgeon: Rolm Bookbinder, MD;  Location: [facility];  Service: General;  Laterality: N/A;  . RADIOLOGY WITH ANESTHESIA  N/A 01/03/2016   Procedure: RADIOLOGY WITH ANESTHESIA;  Surgeon: Medication Radiologist, MD;  Location: [facility];  Service: Radiology;  Laterality: N/A;  . VENTRICULOPERITONEAL SHUNT Right 02/04/2016   Procedure: SHUNT INSERTION VENTRICULAR-PERITONEAL With Laparoscopic Assistance;  Surgeon: Kevan Ny Ditty, MD;  Location: [facility];  Service: Neurosurgery;  Laterality: Right;    Assessment &  Plan Clinical Impression: Patient is a 55 y.o. year old female with history of SAH due to ruptured aneurysm 12/2015 WPW/PSVT, HA; who was admitted on 10/24/16 for clipping of B-MCA, L-PICA and basial tip aneurysms by Dr. Cyndy Freeze. History taken from chart review. Post op developed left sided weakness and CT head done revealing right internal capsule infarct with right frontal and anterior temporal edema likely due to post op swelling and stable post op blood products.  Mentation is improving with improvement in left sided weakness, however, continues to have left neglect with right lean.  .  Patient transferred to CIR on 10/29/2016 .    Patient currently requires min - mod  with basic self-care skills secondary to muscle weakness, decreased cardiorespiratoy endurance, decreased coordination and decreased motor planning, L inattention, decreased initiation, decreased attention, decreased awareness, decreased problem solving, decreased safety awareness, decreased memory and delayed processing and decreased sitting balance, decreased standing balance and decreased balance strategies.  Prior to hospitalization, patient could complete ADls and IADLs with independent .  Patient will benefit from skilled intervention to increase independence with basic self-care skills prior to discharge home with care partner.  Anticipate patient will require 24 hour supervision and follow up home health.  OT - End of Session Activity Tolerance: Decreased this session Endurance Deficit: Yes OT Assessment Rehab Potential (ACUTE ONLY): Fair OT Patient demonstrates impairments in the following area(s): Behavior;Cognition;Endurance;Motor;Pain;Perception;Safety;Vision OT Basic ADL's Functional Problem(s): Grooming;Bathing;Dressing;Toileting OT Transfers Functional Problem(s): Toilet;Tub/Shower OT Additional Impairment(s): Fuctional Use of Upper Extremity OT Plan OT Intensity: Minimum of 1-2 x/day, 45 to 90 minutes OT Frequency: 5  out of 7 days OT Duration/Estimated Length of Stay: 10-14 days OT Treatment/Interventions: Balance/vestibular training;Neuromuscular re-education;Self Care/advanced ADL retraining;Therapeutic Exercise;UE/LE Strength taining/ROM;DME/adaptive equipment instruction;Cognitive remediation/compensation;Community reintegration;Patient/family education;UE/LE Coordination activities;Discharge planning;Functional mobility training;Psychosocial support;Therapeutic Activities OT Self Feeding Anticipated Outcome(s): n/a OT Basic Self-Care Anticipated Outcome(s): overall supervision OT Toileting Anticipated Outcome(s): overall supervision OT Bathroom Transfers Anticipated Outcome(s): overall supervision OT Recommendation Patient destination: Home Follow Up Recommendations: Home health OT;24 hour supervision/assistance Equipment Recommended: To be determined   Skilled Therapeutic Intervention Upon entering the room, pt supine in bed with no c/o pain. OT educated pt on OT purpose, POC, and goals along with expectations of participation with therapeutic interventions. Pt showing limited insight to deficits this session. Pt declined bathing and dressing tasks. Pt verbalizing need for toileting x 2 this session. Pt ambulating without device and needing steady assistance into bathroom. Pt performed clothing management and hygiene with steady assistance for balance. Pt with R gaze but able to scan to L side with mod verbal cues. Pt attempting to read schedule with difficulty. Pt remained in bed at end of session with bed alarm activated and call bell within reach.    OT Evaluation Precautions/Restrictions  Precautions Precautions: Fall Precaution Comments: (P) L inattention Restrictions Weight Bearing Restrictions: No  Pain Pain Assessment Pain Assessment: No/denies pain Home Living/Prior Functioning Home Living Family/patient expects to be discharged to:: Private residence Living Arrangements:  Parent Available Help at Discharge: Family, Available 24 hours/day Type of Home: House Home Access: Stairs to enter [employer] of Steps: 2 Entrance  Stairs-Rails: (P) Can reach both Home Layout: One level Bathroom Shower/Tub: Curtain, Tub only Biochemist, clinical: Standard Additional Comments: Pt lives w/ mother and 62 y/o grandson.  Per pt mother is in good physical health to assist pt at d/c. Her mother does not drive.   Lives With: Family (lives with mother) Prior Function Level of Independence: Independent with basic ADLs, Independent with homemaking with ambulation, Independent with gait, Independent with transfers  Able to Take Stairs?: Yes Driving: Yes Vocation: Full time employment 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  patient has not worked recently due to medical issues 06/2016  Vision/Perception  Vision- History Baseline Vision/History: No visual deficits Patient Visual Report: No change from baseline Vision- Assessment Vision Assessment?: Vision impaired- to be further tested in functional context Perception Inattention/Neglect: (P) Does not attend to left visual field  Cognition Overall Cognitive Status: (P) Impaired/Different from baseline Sensation Sensation Light Touch: (P) Appears Intact Motor  Motor Motor: Abnormal postural alignment and control Motor - Skilled Clinical Observations: decreased coordination and inattention on L  Trunk/Postural Assessment  Cervical Assessment Cervical Assessment: Exceptions to [facility] (R gaze- head turn) Thoracic Assessment Thoracic Assessment: Exceptions to [facility] Lumbar Assessment Lumbar Assessment: Exceptions to [facility] (posterior pelvic tilt) Postural Control Postural Control: Deficits on evaluation  Balance Balance Balance Assessed: (P) Yes Standardized Balance Assessment Standardized Balance Assessment: (P) Berg Balance Test Extremity/Trunk Assessment RUE Assessment RUE Assessment: Within  Functional Limits LUE Assessment LUE Assessment: Exceptions to [facility] (functionally able to use but pt does not attend to L UE)   See Function Navigator for Current Functional Status.   Refer to Care Plan for Long Term Goals  Recommendations for other services: None  Discharge Criteria: Patient will be discharged from OT if patient refuses treatment 3 consecutive times without medical reason, if treatment goals not met, if there is a change in medical status, if patient makes no progress towards goals or if patient is discharged from hospital.  The above assessment, treatment plan, treatment alternatives and goals were discussed and mutually agreed upon: by patient  Gypsy Decant 10/30/2016, 2:34 PM

## 2016-10-30 NOTE — Care Management Note (Signed)
[facility] Individual Statement of Services  Patient Name:  [name] [name]  Date:  10/30/2016  Welcome to the [facility].  Our goal is to provide you with an individualized program based on your diagnosis and situation, designed to meet your specific needs.  With this comprehensive rehabilitation program, you will be expected to participate in at least 3 hours of rehabilitation therapies Monday-Friday, with modified therapy programming on the weekends.  Your rehabilitation program will include the following services:  Physical Therapy (PT), Occupational Therapy (OT), Speech Therapy (ST), 24 hour per day rehabilitation nursing, Therapeutic Recreaction (TR), Neuropsychology, Case Management (Social Worker), Rehabilitation Medicine, Nutrition Services and Pharmacy Services  Weekly team conferences will be held on Wednesday to discuss your progress.  Your Social Worker will talk with you frequently to get your input and to update you on team discussions.  Team conferences with you and your family in attendance may also be held.  Expected length of stay: 10-14 days  Overall anticipated outcome: supervision with cueing  Depending on your progress and recovery, your program may change. Your Social Worker will coordinate services and will keep you informed of any changes. Your Social Worker's name and contact numbers are listed  below.  The following services may also be recommended but are not provided by the [facility] will be made to provide these services after discharge if needed.  Arrangements include referral to agencies that provide these services.  Your insurance has been verified to be:  Medicaid Your primary doctor is:    Pertinent information will be shared with your doctor and your insurance company.  Social  Worker:  Lennart Pall, [place] or ([phone]   Information discussed with and copy given to patient by: Elease Hashimoto, 10/30/2016, 3:10 PM

## 2016-10-30 NOTE — Progress Notes (Signed)
Social Work Assessment and Plan Social Work Assessment and Plan  Patient Details  Name: [name] [name] MRN: [id number] Date of Birth: [birth year]  Today's Date: 10/30/2016  Problem List:  Patient Active Problem List   Diagnosis Date Noted  . CVA (cerebral vascular accident) ([place]) 10/29/2016  . Stroke due to embolism of middle cerebral artery ([place]) 10/29/2016  . Seizure prophylaxis   . History of supraventricular tachycardia   . Neurologic abnormality   . Stroke syndrome ([place])   . Vascular headache   . Other secondary hypertension   . Hypokalemia   . AKI (acute kidney injury) ([place])   . Lymphocytosis   . Lethargy   . Brain edema ([place])   . Intracranial aneurysm 07/09/2016  . Communicating hydrocephalus 02/03/2016  . Essential hypertension   . Paroxysmal SVT (supraventricular tachycardia) ([place])   . Paroxysmal atrial fibrillation (HCC)   . Tobacco abuse   . Tachypnea   . Bradycardia   . Leukocytosis   . Acute blood loss anemia   . Thrombocytosis ([place])   . Chest pain   . Nontraumatic subarachnoid hemorrhage ([place])   . Acute respiratory failure ([place])   . Encounter for central line placement   . Hypoxemia   . Renal insufficiency   . Encounter for imaging study to confirm nasogastric (NG) tube placement   . Endotracheally intubated   . Respiratory failure ([place])   . SAH (subarachnoid hemorrhage) ([place])   . Subarachnoid hemorrhage due to ruptured aneurysm ([place]) 01/03/2016  . Subarachnoid hemorrhage ([place]) 01/03/2016  . Atrial fibrillation with rapid ventricular response ([place])   . Elevated lactic acid level   . SMOKER 03/20/2009  . HYPERTENSION, UNSPECIFIED 03/20/2009  . WPW 03/20/2009  . SVT/ PSVT/ PAT 03/20/2009  . BRONCHITIS 03/20/2009   Past Medical History:  Past Medical History:  Diagnosis Date  . Arthritis   . Brain aneurysm   . Headache   . Hypertension    QUIT TAKING BP MEDS NONE IN 10+ YEARS  . SVT (supraventricular tachycardia) (HCC)    s/p radiofrequency  catheter ablation for AVNRT 02/16/09 (Dr. Cristopher Peru)  . Thyroid disease    PROCEDURE FOR THYROID 15 YRS AGO AT [facility]  . WPW (Wolff-Parkinson-White syndrome)    Past Surgical History:  Past Surgical History:  Procedure Laterality Date  . BREAST SURGERY     BX RT BREAST  BENIGN  . CRANIOTOMY N/A 01/04/2016   Procedure: Suboccipital Craniotomy and Cervical one Laminectomy for Clipping of Aneurysm;  Surgeon: Kevan Ny Ditty, MD;  Location: [facility]S;  Service: Neurosurgery;  Laterality: N/A;  . CRANIOTOMY N/A 07/09/2016   Procedure: Craniotomy for clipping of left middle cerebral artery aneurysm;  Surgeon: Kevan Ny Ditty, MD;  Location: [place] [facility];  Service: Neurosurgery;  Laterality: N/A;  Craniotomy for clipping of left middle cerebral artery aneurysm  . 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 Christella Noa;  Surgeon: Kevan Ny Ditty, MD;  Location: [facility];  Service: Neurosurgery;  Laterality: Right;  . ELECTROPHYSIOLOGIC STUDY    . LAPAROSCOPIC REVISION VENTRICULAR-PERITONEAL (V-P) SHUNT N/A 02/04/2016   Procedure: LAPAROSCOPIC Insertion VENTRICULAR-PERITONEAL (V-P) SHUNT;  Surgeon: Rolm Bookbinder, MD;  Location: [facility] NEURO ORS;  Service: General;  Laterality: N/A;  . RADIOLOGY WITH ANESTHESIA N/A 01/03/2016   Procedure: RADIOLOGY WITH ANESTHESIA;  Surgeon: Medication Radiologist, MD;  Location: [facility];  Service: Radiology;  Laterality: N/A;  . VENTRICULOPERITONEAL SHUNT Right 02/04/2016   Procedure:  SHUNT INSERTION VENTRICULAR-PERITONEAL With Laparoscopic Assistance;  Surgeon: Kevan Ny Ditty, MD;  Location: [facility];  Service: Neurosurgery;  Laterality: Right;   Social History:  reports that she has been smoking.  She has been smoking about 0.50 packs per day. She has never used smokeless tobacco. She reports that she does not drink alcohol or use drugs.  Family / Support Systems Marital  Status: Divorced Patient Roles: Other (Comment) (Mom and 84 yo grandson) Spouse/Significant Other: WIlliam Roberts-ex-husband [phone] Other Supports: Lyndon Code  [phone]  Anticipated Caregiver: Mom and ex-husband will help Ability/Limitations of Caregiver: Mom is 27 yo and already caring for her great grandson, she reports pt is stubborn and does what she wants. She will try to assist her at discharge Caregiver Availability: 24/7 Family Dynamics: Mom reports it has been hard with pt and her memory issues, she can't leave her alone. She does leave at times to take her great grandson to school. Pt's ex-husband does help at times also. Mom needs her to be as independent as possible before coming home.  Social History Preferred language: English Religion: Baptist Cultural Background: No issues Education: Secondary school teacher Read: Yes Write: Yes Employment Status: Disabled Date Retired/Disabled/Unemployed: July 2017 Legal Hisotry/Current Legal Issues: No issues Guardian/Conservator: None-according to MD pt is not capable of making her own decisions while here, will look toward her mother since she lives with her and has very little contact with her daughter   Abuse/Neglect Physical Abuse: Denies Verbal Abuse: Denies Sexual Abuse: Denies Exploitation of patient/patient's resources: Denies Self-Neglect: Denies  Emotional Status Pt's affect, behavior adn adjustment status: Pt is very tired from therapies, she is in bed when not in therapies.  Difficult to arouse and stay awake, reason contacted Mom for information. She reports pt is stubborn and hard headed and does what she wants. Was not doing her exercies and wanted to drive but was not ready too. Recent Psychosocial Issues: memory issues from past surgeries and Mom was having difficulty caring for her prior to admission and now she has had a stroke. Pyschiatric History: No history pt not able to stay awake to  participate in depression screen she would benefit from seeing neuro-psych when appropriate.  Substance Abuse History: Tobacco aware of the risks and should not be smoking due to memory issues and could cause a fire byu a unattended cigarette. Mom aware of this also and it scares her.  Patient / Family Perceptions, Expectations & Goals Pt/Family understanding of illness & functional limitations: Mom can explain her daughter's stroke and all of the other surgeries she has had recently. She states: " It is one thing after another."  She relies upon pt's ex-husbnand to come see her and keep her updated. She can not come often due to distance. Premorbid pt/family roles/activities: Grandmother, reitree, friend, church member,etc Anticipated changes in roles/activities/participation: resume Pt/family expectations/goals: Mom states: " I want her to get as much rehab as she can I can't do too much for her, I am 55 years old."  [employer]: Other (Comment) Premorbid Home Care/DME Agencies: Other (Comment) (has [facility] in the past) Transportation available at discharge: Ex-husband and Mom drives in [employer] referrals recommended: Neuropsychology, Support group (specify)  Discharge Planning Living Arrangements: Parent, Other relatives Support Systems: Spouse/significant other, Parent, Other relatives, Friends/neighbors Type of Residence: Private residence Insurance Resources: [employer] (specify county) Museum/gallery curator Resources: SSI Financial Screen Referred: No Living Expenses: Lives with family Money Management: Family Does the patient have any  problems obtaining your medications?: No Home Management: Mom does this, pt has tried to help her with this Patient/Family Preliminary Plans: The plan is dependent upon how mcuh care pt will require, Mom feels her hads are full raising a 38 yo great grandson. Mom was overhwelmed with pt at home prior to this injury and she would try  not to leave her alone. Pt had memory issues prior to admission and needed 24 hr supervision level. Social Work Anticipated Follow Up Needs: HH/OP, SNF, Support Group  Clinical Impression Very lethargic female who is going to therapies, but the rest of the time is in bed sleeping. Obtained information form Mom via telephone who reports she was having difficulty providing care before this hospitalization due to pt is stubborn And does what she wants to do, even if not in her best interest. Pt has more deficits now, will see how much progress she makes while here and see if discharging home is a realistic option. 8 yo Mom can only do so much and raise her great grandson too. Pt's ex-husband does help out also. Will work on a safe discharge plan.  Elease Hashimoto 10/30/2016, 3:03 PM

## 2016-10-30 NOTE — Progress Notes (Signed)
[facility] PHYSICAL MEDICINE & REHABILITATION     PROGRESS NOTE  Subjective/Complaints:  Pt laying in bed this AM.  She hums "uh huh" to questions asked regarding rest overnight and therapies this AM.  Right side of body remains turned and face down in pillow.  ROS: Appears to deny CP, SOB, N/V/D.  Objective: Vital Signs: Blood pressure 130/75, pulse (!) 48, temperature 99 F (37.2 C), temperature source Oral, resp. rate 18, SpO2 100 %. No results found.  Recent Labs  10/30/16 0528  WBC 11.9*  HGB 12.8  HCT 39.5  PLT 319    Recent Labs  10/30/16 0528  NA 137  K 4.3  CL 108  GLUCOSE 100*  BUN 21*  CREATININE 1.11*  CALCIUM 9.2   CBG (last 3)  No results for input(s): GLUCAP in the last 72 hours.  Wt Readings from Last 3 Encounters:  10/29/16 89.4 kg (197 lb)  10/20/16 89.5 kg (197 lb 4.8 oz)  07/13/16 85.7 kg (189 lb)    Physical Exam:  BP 130/75 (BP Location: Right Arm)   Pulse (!) 48   Temp 99 F (37.2 C) (Oral)   Resp 18   SpO2 100%  Constitutional: She appears well-developed and well-nourished. NAD. Right side of face down in pillow HENT: Normocephalic. Well healed old left crani incision and right crani incision C/D/I with staples in place.   Eyes: Right eye exhibits no discharge. Left eye exhibits no discharge. Right ptosis.  Cardiovascular: +Bradycardia. Regular rhythm.  No JVD. Respiratory: Effort normal and breath sounds normal. No respiratory distress.  GI: Soft. Bowel sounds are normal. She exhibits no distension. There is no tenderness.  Musculoskeletal: No edema or tenderness in extremitites  Neurological: She is alert and oriented.  Left inattention.  Right ptosis and left facial weakness.  Does not have good awareness of deficits.  Motor: 4+/5 grossly throughout  Skin: Skin is warm and dry.  Psychiatric: Her affect is blunt. Her speech is delayed. She is slowed   Assessment/Plan: 1. Functional deficits secondary to right internal  capsule infarct after aneurysm clippings with hx of SAH due to ruptured aneurysms which require 3+ hours per day of interdisciplinary therapy in a comprehensive inpatient rehab setting. Physiatrist is providing close team supervision and 24 hour management of active medical problems listed below. Physiatrist and rehab team continue to assess barriers to discharge/monitor patient progress toward functional and medical goals.  Function:  Bathing Bathing position      Bathing parts      Bathing assist        Upper Body Dressing/Undressing Upper body dressing                    Upper body assist        Lower Body Dressing/Undressing Lower body dressing                                  Lower body assist        Toileting Toileting          Toileting assist     Transfers Chair/bed transfer             Locomotion Ambulation           Wheelchair          Cognition Comprehension Comprehension assist level: Follows basic conversation/direction with extra time/assistive device  Expression Expression assist level: Expresses basic  needs/ideas: With extra time/assistive device  Social Interaction Social Interaction assist level: Interacts appropriately 90% of the time - Needs monitoring or encouragement for participation or interaction.  Problem Solving Problem solving assist level: Solves basic problems with no assist  Memory Memory assist level: Recognizes or recalls 90% of the time/requires cueing < 10% of the time    Medical Problem List and Plan: 1.  Left sided weakness, cognitive deficits and severe left inattention secondary to right internal capsule infarct after aneurysm clippings with hx of SAH due to ruptured aneurysms.   Begin CIR 2.  DVT Prophylaxis/Anticoagulation: Mechanical: Sequential compression devices, below knee Bilateral lower extremities 3. Pain Management: Will continue Hydrocodone prn for now--denies any pain.  4. Mood:  LCSW to follow for evaluation and support.  5. Neuropsych: This patient is not fully capable of making decisions on her own behalf. 6. Skin/Wound Care: Monitor incision for healing. Maintain adequate nutritional and hydration status.  7. Fluids/Electrolytes/Nutrition: Monitor I/O.  8. Seizure prophylaxis: On keppra.  9. H/o SVT/WPW s/p ablation: Monitor HR bid 10. Hypokalemia: ?Resolved  K+4.3 on 11/9 11. Leukocytosis:   WBCs 11.9 on 11/9 (trending down)  Cont to monitor 12. AKI: Avoid nephrotoxic meds.   Cr 1.11 on 11/9  Encourage fluid intake 13. ABLA: ?Resolved  Hb 12.8 on 11/9 14. Bradycardia  Asymptomatic at present  Will cont to monitor   LOS (Days) 1 A FACE TO FACE EVALUATION WAS PERFORMED  Stefen Juba Lorie Phenix 10/30/2016 8:17 AM

## 2016-10-30 NOTE — Evaluation (Signed)
Speech Language Pathology Assessment and Plan  Patient Details  Name: [name] [name] MRN: [id number] Date of Birth: 1961-11-24  SLP Diagnosis: Cognitive Impairments  Rehab Potential: Good ELOS: 12-14 days     Today's Date: 10/30/2016 SLP Individual Time: 1003-1100 SLP Individual Time Calculation (min): 57 min    Problem List: Patient Active Problem List   Diagnosis Date Noted  . CVA (cerebral vascular accident) ([place]) 10/29/2016  . Stroke due to embolism of middle cerebral artery ([place]) 10/29/2016  . Seizure prophylaxis   . History of supraventricular tachycardia   . Neurologic abnormality   . Stroke syndrome ([place])   . Vascular headache   . Other secondary hypertension   . Hypokalemia   . AKI (acute kidney injury) ([place])   . Lymphocytosis   . Lethargy   . Brain edema ([place])   . Intracranial aneurysm 07/09/2016  . Communicating hydrocephalus 02/03/2016  . Essential hypertension   . Paroxysmal SVT (supraventricular tachycardia) ([place])   . Paroxysmal atrial fibrillation ([facility])   . Tobacco abuse   . Tachypnea   . Bradycardia   . Leukocytosis   . Acute blood loss anemia   . Thrombocytosis ([place])   . Chest pain   . Nontraumatic subarachnoid hemorrhage ([place])   . Acute respiratory failure ([place])   . Encounter for central line placement   . Hypoxemia   . Renal insufficiency   . Encounter for imaging study to confirm nasogastric (NG) tube placement   . Endotracheally intubated   . Respiratory failure ([place])   . SAH (subarachnoid hemorrhage) ([place])   . Subarachnoid hemorrhage due to ruptured aneurysm ([place]) 01/03/2016  . Subarachnoid hemorrhage ([place]) 01/03/2016  . Atrial fibrillation with rapid ventricular response ([place])   . Elevated lactic acid level   . SMOKER 03/20/2009  . HYPERTENSION, UNSPECIFIED 03/20/2009  . WPW 03/20/2009  . SVT/ PSVT/ PAT 03/20/2009  . BRONCHITIS 03/20/2009   Past Medical History:  Past Medical History:  Diagnosis Date  . Arthritis   . Brain  aneurysm   . Headache   . Hypertension    QUIT TAKING BP MEDS NONE IN 10+ YEARS  . 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 Cristopher Peru)  . Thyroid disease    PROCEDURE FOR THYROID 15 YRS AGO AT [facility]  . WPW (Wolff-Parkinson-White syndrome)    Past Surgical History:  Past Surgical History:  Procedure Laterality Date  . BREAST SURGERY     BX RT BREAST  BENIGN  . CRANIOTOMY N/A 01/04/2016   Procedure: Suboccipital Craniotomy and Cervical one Laminectomy for Clipping of Aneurysm;  Surgeon: Kevan Ny Ditty, MD;  Location: [facility];  Service: Neurosurgery;  Laterality: N/A;  . CRANIOTOMY N/A 07/09/2016   Procedure: Craniotomy for clipping of left middle cerebral artery aneurysm;  Surgeon: Kevan Ny Ditty, MD;  Location: [facility];  Service: Neurosurgery;  Laterality: N/A;  Craniotomy for clipping of left middle cerebral artery aneurysm  . 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 Christella Noa;  Surgeon: Kevan Ny Ditty, MD;  Location: [facility];  Service: Neurosurgery;  Laterality: Right;  . ELECTROPHYSIOLOGIC STUDY    . LAPAROSCOPIC REVISION VENTRICULAR-PERITONEAL (V-P) SHUNT N/A 02/04/2016   Procedure: LAPAROSCOPIC Insertion VENTRICULAR-PERITONEAL (V-P) SHUNT;  Surgeon: Rolm Bookbinder, MD;  Location: [facility];  Service: General;  Laterality: N/A;  . RADIOLOGY WITH ANESTHESIA N/A 01/03/2016   Procedure: RADIOLOGY WITH ANESTHESIA;  Surgeon: Medication Radiologist, MD;  Location: [facility];  Service: Radiology;  Laterality: N/A;  . VENTRICULOPERITONEAL SHUNT Right 02/04/2016   Procedure: SHUNT INSERTION VENTRICULAR-PERITONEAL With Laparoscopic Assistance;  Surgeon: Kevan Ny Ditty, MD;  Location: [facility];  Service: Neurosurgery;  Laterality: Right;    Assessment / Plan / Recommendation Clinical Impression   [name] [name] a 55  y.o.femalewith history of SAH due to ruptured aneurysm 12/2015 who was admitted on 10/24/16 for clipping of B-MCA, L-PICA and basal tip aneurysms by Dr. Cyndy Freeze. History taken from chart review. Post op developed left sided weakness and CT head done revealing right internal capsule infarct with right frontal and anterior temporal edema likely due to post op swelling and stable post op blood products. Pt admitted to CIR on 10/29/2016.  SLP evaluation completed on 10/30/2016 with the following results:  Pt presents with moderately severe cognitive deficits characterized by left inattention, impulsivity, poor awareness of deficits, decreased functional problem solving, and decreased recall of new information. As a result, pt would benefit from skilled ST while inpatient in order to maximize functional independence and reduce burden of care prior to discharge.  Anticipate that pt will need 24/7 supervision in addition to [place] follow up at next level of care.    Skilled Therapeutic Interventions          Cognitive-linguistic evaluation completed with results and recommendations reviewed with patient.  Pt had not eaten breakfast prior to therapist's arrival.  Pt needed mod assist verbal cues to attend to left upper extremity during self feeding of meal.  Pt also presented with poor awareness of deficits and provided conflicting information when discussing current limitations s/p CVA.  Pt needed overall mod assist verbal cues for redirection to tasks as she was noted to impulsively turn over and lay on her side in the middle of eating or talking with therapist.  Recommend full supervision during meals due to cognition.  Pt was left in bed at the end of today's therapy session with bed alarm set and call bell within reach.     SLP Assessment  Patient will need skilled [place] [facility] Services during CIR admission    Recommendations  Recommendations for Other Services: Neuropsych consult Patient destination:  Home Follow up Recommendations: Home Health SLP;Outpatient SLP;24 hour supervision/assistance Equipment Recommended: None recommended by SLP    SLP Frequency 3 to 5 out of 7 days   SLP Duration  SLP Intensity  SLP Treatment/Interventions 12-14 days   Minumum of 1-2 x/day, 30 to 90 minutes  Cognitive remediation/compensation;Cueing hierarchy;Functional tasks;Internal/external aids;Environmental controls;Patient/family education    Pain Pain Assessment Pain Assessment: No/denies pain  Prior Functioning Cognitive/Linguistic Baseline: Baseline deficits (suspected ) Type of Home: House  Lives With: Family Available Help at Discharge: Family;Available 24 hours/day Education: 10th grade  Vocation: Unemployed (pt reports she has not worked since July )  Function:  Eating Eating   Modified Consistency Diet: No Eating Assist Level: Set up assist for;Supervision or verbal cues   Eating Set Up Assist For: Opening containers       Cognition Comprehension Comprehension assist level: Understands basic 90% of the time/cues < 10% of the time  Expression   Expression assist level: Expresses basic 75 - 89% of the time/requires cueing 10 - 24% of the time. Needs helper to occlude trach/needs to repeat words.  Social Interaction Social Interaction assist level: Interacts appropriately 50 - 74% of the time - May be physically or verbally inappropriate.  Problem Solving Problem  solving assist level: Solves basic 50 - 74% of the time/requires cueing 25 - 49% of the time  Memory Memory assist level: Recognizes or recalls 50 - 74% of the time/requires cueing 25 - 49% of the time   Short Term Goals: Week 1: SLP Short Term Goal 1 (Week 1): Pt will sustain her attention to basic familiar tasks for 2-3 minutes with min assist verbal cues for redirection.   SLP Short Term Goal 2 (Week 1): Pt will complete basic, familiar tasks with min assist verbal cues for functional problem solving. SLP Short  Term Goal 3 (Week 1): Pt will attend to left upper extremity and the environment during basic tasks with min assist verbal cues.  SLP Short Term Goal 4 (Week 1): Pt will utilize external aids to facilitate recall of daily information with mod assist verbal cues.    Refer to Care Plan for Long Term Goals  Recommendations for other services: None  Discharge Criteria: Patient will be discharged from SLP if patient refuses treatment 3 consecutive times without medical reason, if treatment goals not met, if there is a change in medical status, if patient makes no progress towards goals or if patient is discharged from hospital.  The above assessment, treatment plan, treatment alternatives and goals were discussed and mutually agreed upon: by patient  Emilio Math 10/30/2016, 3:56 PM

## 2016-10-30 NOTE — Evaluation (Signed)
Physical Therapy Assessment and Plan  Patient Details  Name: LATIESHA HARADA MRN: [id number] Date of Birth: 12-26-1960  PT Diagnosis: Difficulty walking, Impaired cognition and Muscle weakness Rehab Potential: Good ELOS: 10-14 days   Today's Date: 10/30/2016 PT Individual Time: 1400-1510 PT Individual Time Calculation (min): 70 min     Problem List: Patient Active Problem List   Diagnosis Date Noted  . CVA (cerebral vascular accident) ([place]) 10/29/2016  . Stroke due to embolism of middle cerebral artery ([place]) 10/29/2016  . Seizure prophylaxis   . History of supraventricular tachycardia   . Neurologic abnormality   . Stroke syndrome ([place])   . Vascular headache   . Other secondary hypertension   . Hypokalemia   . AKI (acute kidney injury) ([place])   . Lymphocytosis   . Lethargy   . Brain edema ([place])   . Intracranial aneurysm 07/09/2016  . Communicating hydrocephalus 02/03/2016  . Essential hypertension   . Paroxysmal SVT (supraventricular tachycardia) ([place])   . Paroxysmal atrial fibrillation (HCC)   . Tobacco abuse   . Tachypnea   . Bradycardia   . Leukocytosis   . Acute blood loss anemia   . Thrombocytosis ([place])   . Chest pain   . Nontraumatic subarachnoid hemorrhage ([place])   . Acute respiratory failure ([place])   . Encounter for central line placement   . Hypoxemia   . Renal insufficiency   . Encounter for imaging study to confirm nasogastric (NG) tube placement   . Endotracheally intubated   . Respiratory failure ([place])   . SAH (subarachnoid hemorrhage) ([place])   . Subarachnoid hemorrhage due to ruptured aneurysm ([place]) 01/03/2016  . Subarachnoid hemorrhage ([place]) 01/03/2016  . Atrial fibrillation with rapid ventricular response ([place])   . Elevated lactic acid level   . SMOKER 03/20/2009  . HYPERTENSION, UNSPECIFIED 03/20/2009  . WPW 03/20/2009  . SVT/ PSVT/ PAT 03/20/2009  . BRONCHITIS 03/20/2009    Past Medical History:  Past Medical History:  Diagnosis Date  .  Arthritis   . Brain aneurysm   . Headache   . Hypertension    QUIT TAKING BP MEDS NONE IN 10+ YEARS  . 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 Cristopher Peru)  . Thyroid disease    PROCEDURE FOR THYROID 15 YRS AGO AT [facility]  . WPW (Wolff-Parkinson-White syndrome)    Past Surgical History:  Past Surgical History:  Procedure Laterality Date  . BREAST SURGERY     BX RT BREAST  BENIGN  . CRANIOTOMY N/A 01/04/2016   Procedure: Suboccipital Craniotomy and Cervical one Laminectomy for Clipping of Aneurysm;  Surgeon: Kevan Ny Ditty, MD;  Location: [facility];  Service: Neurosurgery;  Laterality: N/A;  . CRANIOTOMY N/A 07/09/2016   Procedure: Craniotomy for clipping of left middle cerebral artery aneurysm;  Surgeon: Kevan Ny Ditty, MD;  Location: [facility];  Service: Neurosurgery;  Laterality: N/A;  Craniotomy for clipping of left middle cerebral artery aneurysm  . 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 Christella Noa;  Surgeon: Kevan Ny Ditty, MD;  Location: [facility];  Service: Neurosurgery;  Laterality: Right;  . ELECTROPHYSIOLOGIC STUDY    . LAPAROSCOPIC REVISION VENTRICULAR-PERITONEAL (V-P) SHUNT N/A 02/04/2016   Procedure: LAPAROSCOPIC Insertion VENTRICULAR-PERITONEAL (V-P) SHUNT;  Surgeon: Rolm Bookbinder, MD;  Location: [facility];  Service: General;  Laterality: N/A;  . RADIOLOGY WITH ANESTHESIA N/A 01/03/2016   Procedure: RADIOLOGY  WITH ANESTHESIA;  Surgeon: Medication Radiologist, MD;  Location: [facility];  Service: Radiology;  Laterality: N/A;  . VENTRICULOPERITONEAL SHUNT Right 02/04/2016   Procedure: SHUNT INSERTION VENTRICULAR-PERITONEAL With Laparoscopic Assistance;  Surgeon: Kevan Ny Ditty, MD;  Location: [facility];  Service: Neurosurgery;  Laterality: Right;    Assessment & Plan Clinical Impression: Patient is a 55 y.o.  year old female with recent admission to the hospitalwith history of [place] due to ruptured aneurysm 12/2015 WPW/PSVT, HA; who was admitted on 10/24/16 for clipping of B-MCA, L-PICA and basial tip aneurysms by Dr. Cyndy Freeze. History taken from chart review. Post op developed left sided weakness and CT head done revealing right internal capsule infarct with right frontal and anterior temporal edema likely due to post op swelling and stable post op blood products.  Mentation is improving with improvement in left sided weakness, however, continues to have left neglect with right lean. Therapy ongoing and CIR recommended for follow up therapy. Patient transferred to CIR on 10/29/2016 .   Patient currently requires min with mobility secondary to muscle weakness and muscle joint tightness, decreased cardiorespiratoy endurance, unbalanced muscle activation, decreased attention to left, decreased attention, decreased awareness and decreased safety awareness and decreased standing balance, decreased postural control and decreased balance strategies.  Prior to hospitalization, patient was independent  with mobility and lived with Family (lives with mother) in a House home.  Home access is 2Stairs to enter.  Patient will benefit from skilled PT intervention to maximize safe functional mobility, minimize fall risk and decrease caregiver burden for planned discharge home with 24 hour supervision.  Anticipate patient will benefit from follow up [place] at discharge.  PT - End of Session Activity Tolerance: Decreased this session Endurance Deficit: Yes PT Assessment Rehab Potential (ACUTE/IP ONLY): Good PT Patient demonstrates impairments in the following area(s): Balance;Behavior;Endurance;Motor;Pain;Perception;Safety;Sensory PT Transfers Functional Problem(s): Bed Mobility;Bed to Chair;Car;Furniture;Floor PT Locomotion Functional Problem(s): Ambulation;Stairs PT Plan PT Intensity: Minimum of 1-2 x/day ,45 to 90 minutes PT  Frequency: 5 out of 7 days PT Duration Estimated Length of Stay: 10-14 days PT Treatment/Interventions: Ambulation/gait training;Balance/vestibular training;Cognitive remediation/compensation;Community reintegration;Discharge planning;Disease management/prevention;DME/adaptive equipment instruction;Functional mobility training;Neuromuscular re-education;Pain management;Patient/family education;Psychosocial support;Stair training;Therapeutic Activities;Therapeutic Exercise;UE/LE Strength taining/ROM;UE/LE Coordination activities;Visual/perceptual remediation/compensation;Wheelchair propulsion/positioning PT Transfers Anticipated Outcome(s): supervision overall PT Locomotion Anticipated Outcome(s): supervision gait PT Recommendation Follow Up Recommendations: Home health PT;24 hour supervision/assistance Patient destination: Home Equipment Recommended: To be determined  Skilled Therapeutic Intervention Evaluation completed (see details above and below) with education on PT POC and goals and individual treatment initiated with focus on various transfers (basic transfers, simulated car transfer, and toileting), gait training without AD to challenge balance, stair negotiation for functional mobility assessment, administered Berg balance test (see results below), neuro re-ed on Nustep for reciprocal movement pattern re-training with cognitive task to attend to the time (decreased awareness of LUE falling off of handle), card matching task in standing to address postural control, cognition, and L attention (cards placed on L), and overall endurance. Pt requires min to mod verbal cues overall for cognition during session and demonstrates poor insight and awareness into her deficits. When asked what has changed since being in the hospital, pt reports "nothing. I am fine." even when asked more specifically or educating on observed impairments.    PT Evaluation Precautions/Restrictions Precautions Precautions:  Fall Precaution Comments: L inattention Restrictions Weight Bearing Restrictions: No Pain Pain Assessment Pain Assessment: No/denies pain Home Living/Prior Functioning Home Living Living Arrangements: Parent;Other relatives Available Help at Discharge: Family;Available 24 hours/day  Type of Home: House Home Access: Stairs to enter [employer] of Steps: 2 Entrance Stairs-Rails: Can reach both Home Layout: One level Bathroom Shower/Tub: Curtain;Tub only Biochemist, clinical: Standard Additional Comments: Pt lives w/ mother and 62 y/o grandson.  Per pt mother is in good physical health to assist pt at d/c. Her mother does not drive.   Lives With: Family (lives with mother) Prior Function Level of Independence: Independent with basic ADLs;Independent with homemaking with ambulation;Independent with gait;Independent with transfers  Able to Take Stairs?: Yes Driving: Yes Vocation: Full time employment 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  patient has not worked recently due to medical issues 06/2016  Vision/Perception  Perception Inattention/Neglect: Does not attend to left visual field  Cognition Overall Cognitive Status: Impaired/Different from baseline Orientation Level: Oriented to person;Oriented to place;Oriented to situation Awareness: Impaired Awareness Impairment: Intellectual impairment Behaviors: Impulsive Safety/Judgment: Impaired Comments: flat affect Sensation Sensation Light Touch: Appears Intact Proprioception:  (to be further assessed) Additional Comments: decreased awareness of LUE;  Coordination Gross Motor Movements are Fluid and Coordinated: No Motor  Motor Motor: Abnormal postural alignment and control Motor - Skilled Clinical Observations: decreased coordination and inattention on L     Trunk/Postural Assessment  Cervical Assessment Cervical Assessment:  (maintains head turned to R) Thoracic Assessment Thoracic  Assessment:  (flexed posture) Lumbar Assessment Lumbar Assessment:  (posterior pelvic tilt) Postural Control Postural Control: Deficits on evaluation (decreased )  Balance Balance Balance Assessed: Yes Standardized Balance Assessment Standardized Balance Assessment: Berg Balance Test Berg Balance Test Sit to Stand: Able to stand  independently using hands Standing Unsupported: Able to stand 30 seconds unsupported Sitting with Back Unsupported but Feet Supported on Floor or Stool: Able to sit 2 minutes under supervision Stand to Sit: Controls descent by using hands Transfers: Needs one person to assist Standing Unsupported with Eyes Closed: Able to stand 10 seconds with supervision Standing Ubsupported with Feet Together: Able to place feet together independently and stand for 1 minute with supervision From Standing, Reach Forward with Outstretched Arm: Can reach forward >5 cm safely (2") From Standing Position, Pick up Object from Floor: Unable to try/needs assist to keep balance (able to pick up with min assist) From Standing Position, Turn to Look Behind Over each Shoulder: Looks behind one side only/other side shows less weight shift Turn 360 Degrees: Needs close supervision or verbal cueing Standing Unsupported, Alternately Place Feet on Step/Stool: Able to complete >2 steps/needs minimal assist Standing Unsupported, One Foot in Front: Needs help to step but can hold 15 seconds Standing on One Leg: Unable to try or needs assist to prevent fall Total Score: 26 Static Sitting Balance Static Sitting - Level of Assistance: 5: Stand by assistance Dynamic Sitting Balance Dynamic Sitting - Level of Assistance: 5: Stand by assistance Static Standing Balance Static Standing - Level of Assistance: 4: Min assist;5: Stand by assistance Dynamic Standing Balance Dynamic Standing - Level of Assistance: 4: Min assist;3: Mod assist Extremity Assessment   see OT evaluation for UE assessment.    RLE Assessment RLE Assessment:  (grossly WFL) LLE Assessment LLE Assessment:  (decreased functional stability; 3+/5 grossly)   See Function Navigator for Current Functional Status.   Refer to Care Plan for Long Term Goals  Recommendations for other services: None  Discharge Criteria: Patient will be discharged from PT if patient refuses treatment 3 consecutive times without medical reason, if treatment goals not met, if there is a change in medical  status, if patient makes no progress towards goals or if patient is discharged from hospital.  The above assessment, treatment plan, treatment alternatives and goals were discussed and mutually agreed upon: by patient  Juanna Cao, PT, DPT  10/30/2016, 3:41 PM

## 2016-10-30 NOTE — Significant Event (Signed)
Rapid Response Event Note  Overview:  Team activation to 4W05 Time Called: 1251 Arrival Time: 1252 Event Type: Respiratory  Initial Focused Assessment:  On arrival staff present in room - reports patient was eating lunch while sitting in the chair - she choked on a piece of mango fruit - on our arrival patient was in the bed - alert - warm and dry - talking - has mildly congested cough - able to speak clearly - states "it feels like a little something there" and points to throat.  Staff report they did abdominal thrust during event.  Upper airway clear to auscultation - right upper lobe coarse rhonchi - left lung BS clear.  Denies pain or SOB.  O2 sats 100% on RA.  Resps regular - rate 20.  Apical pulse regular.  BP and HR stable - see flowsheet.    Interventions:  Algis Liming PA present in room - patient allowed to consume ice cream - swallows without difficulty - speech remains clear - patient at neuro baseline - smiling - denies pain or SOB - resps remain regular and unlabored.    Plan of Care (if not transferred): Staff to monitor for signs of aspiration - to call as needed.  Other care per medical staff.    Event Summary: Name of Physician Notified: Algis Liming PA at  (present in room)    at    Outcome: Stayed in room and stabalized  Event End Time: [address] [place]

## 2016-10-30 NOTE — Progress Notes (Signed)
Patient information reviewed and entered into eRehab system by Jennet Scroggin, RN, CRRN, PPS Coordinator.  Information including medical coding and functional independence measure will be reviewed and updated through discharge.    

## 2016-10-30 NOTE — IPOC Note (Signed)
Overall Plan of Care [facility]) Patient Details Name: [name] [name] MRN: [id number] DOB: 1961/08/21  Admitting Diagnosis: R  IC infarct   Hospital Problems: Active Problems:   CVA (cerebral vascular accident) ([place])   Stroke due to embolism of middle cerebral artery ([place])     Functional Problem List: Nursing Bladder, Bowel, Endurance, Medication Management, Motor, Sensory, Safety, Skin Integrity  PT Balance, Behavior, Endurance, Motor, Pain, Perception, Safety, Sensory  OT Behavior, Cognition, Endurance, Motor, Pain, Perception, Safety, Vision  SLP Cognition  TR         Basic ADL's: OT Grooming, Bathing, Dressing, Toileting     Advanced  ADL's: OT       Transfers: PT Bed Mobility, Bed to Chair, Car, [employer], Floor  OT Toilet, Metallurgist: PT Ambulation, Stairs     Additional Impairments: OT Fuctional Use of Upper Extremity  SLP Social Cognition   Problem Solving, Memory, Attention, Awareness  TR      Anticipated Outcomes Item Anticipated Outcome  Self Feeding n/a  Swallowing      Basic self-care  overall supervision  Toileting  overall supervision   Bathroom Transfers overall supervision  Bowel/Bladder  Min assist  Transfers  supervision overall  Locomotion  supervision gait  Communication     Cognition  min assist   Pain  < 4  Safety/Judgment  Supervision   Therapy Plan: PT Intensity: Minimum of 1-2 x/day ,45 to 90 minutes PT Frequency: 5 out of 7 days PT Duration Estimated Length of Stay: 10-14 days OT Intensity: Minimum of 1-2 x/day, 45 to 90 minutes OT Frequency: 5 out of 7 days OT Duration/Estimated Length of Stay: 10-14 days SLP Intensity: Minumum of 1-2 x/day, 30 to 90 minutes SLP Frequency: 3 to 5 out of 7 days SLP Duration/Estimated Length of Stay: 12-14 days        Team Interventions: Nursing Interventions Patient/Family Education, Bladder Management, Bowel Management, Medication Management, Disease  Management/Prevention, Skin Care/Wound Management, Cognitive Remediation/Compensation  PT interventions Ambulation/gait training, Balance/vestibular training, Cognitive remediation/compensation, Community reintegration, Discharge planning, Disease management/prevention, DME/adaptive equipment instruction, Functional mobility training, Neuromuscular re-education, Pain management, Patient/family education, Psychosocial support, Stair training, Therapeutic Activities, Therapeutic Exercise, UE/LE Strength taining/ROM, UE/LE Coordination activities, Visual/perceptual remediation/compensation, Wheelchair propulsion/positioning  OT Interventions Training and development officer, Neuromuscular re-education, Self Care/advanced ADL retraining, Therapeutic Exercise, UE/LE Strength taining/ROM, DME/adaptive equipment instruction, Cognitive remediation/compensation, Academic librarian, Barrister's clerk education, UE/LE Coordination activities, Discharge planning, Functional mobility training, Psychosocial support, Therapeutic Activities  SLP Interventions Cognitive remediation/compensation, Cueing hierarchy, Functional tasks, Internal/external aids, Environmental controls, Patient/family education  TR Interventions    SW/CM Interventions Discharge Planning, Psychosocial Support, Patient/Family Education    Team Discharge Planning: Destination: PT-Home ,OT- Home , SLP-Home Projected Follow-up: PT-Home health PT, 24 hour supervision/assistance, OT-  Home health OT, 24 hour supervision/assistance, SLP-Home Health SLP, Outpatient SLP, 24 hour supervision/assistance Projected Equipment Needs: PT-To be determined, OT- To be determined, SLP-None recommended by SLP Equipment Details: PT- , OT-  Patient/family involved in discharge planning: PT- Patient,  OT-Patient, SLP-Patient  MD ELOS: 10-14 days. Medical Rehab Prognosis:  Good Assessment:  55 y.o.femalewith history of SAH due to ruptured aneurysm 12/2015 WPW/PSVT, HA;  who was admitted on 10/24/16 for clipping of B-MCA, L-PICA and basial tip aneurysms by Dr. Cyndy Freeze. Post op developed left sided weakness and CT head done revealing right internal capsule infarct with right frontal and anterior temporal edema likely due to post op swelling and stable post op blood products.  Mentation is improving  with improvement in left sided weakness, however, continues to have left neglect, resulting in balance, gait deficits. Will set goals for Supervision/Min A with PT/OT/SLP.   See Team Conference Notes for weekly updates to the plan of care

## 2016-10-30 NOTE — Progress Notes (Signed)
Called to room by NT stating patient was choking on lunch (mango).  Upon arrival, patient was in wheelchair, visibly distressed, hacking saying something was stuck in her throat.  RN hooked up suction and orally suctioned patient getting thick brownish phlegm in return.  Algis Liming, PA at bedside.  Patient unable to swallow any liquids.  Informed to do heimlich maneuver.  Heimlich maneuver performed with no obvious foreign body return.  Patients VSS.  O2 sats 100% on room air.  RR team paged and present to assist in patient care.  Patient stable at the moment.  New orders received for CXR.  Observed patient swallow small amounts of water with no problems.  Patients ex husband at the bedside at this time.  Brita Romp, RN

## 2016-10-31 ENCOUNTER — Inpatient Hospital Stay (HOSPITAL_COMMUNITY): Payer: Medicaid Other | Admitting: Occupational Therapy

## 2016-10-31 ENCOUNTER — Inpatient Hospital Stay (HOSPITAL_COMMUNITY): Payer: Medicaid Other | Admitting: Speech Pathology

## 2016-10-31 ENCOUNTER — Inpatient Hospital Stay (HOSPITAL_COMMUNITY): Payer: Medicaid Other | Admitting: Physical Therapy

## 2016-10-31 LAB — URINALYSIS, ROUTINE W REFLEX MICROSCOPIC
Bilirubin Urine: NEGATIVE
Glucose, UA: NEGATIVE mg/dL
Hgb urine dipstick: NEGATIVE
Ketones, ur: NEGATIVE mg/dL
Leukocytes, UA: NEGATIVE
Nitrite: NEGATIVE
Protein, ur: NEGATIVE mg/dL
Specific Gravity, Urine: 1.025 (ref 1.005–1.030)
pH: 5.5 (ref 5.0–8.0)

## 2016-10-31 LAB — CBC WITH DIFFERENTIAL/PLATELET
Basophils Absolute: 0 10*3/uL (ref 0.0–0.1)
Basophils Relative: 0 %
Eosinophils Absolute: 0 10*3/uL (ref 0.0–0.7)
Eosinophils Relative: 0 %
HCT: 38.6 % (ref 36.0–46.0)
Hemoglobin: 12.4 g/dL (ref 12.0–15.0)
Lymphocytes Relative: 19 %
Lymphs Abs: 2.8 10*3/uL (ref 0.7–4.0)
MCH: 26.8 pg (ref 26.0–34.0)
MCHC: 32.1 g/dL (ref 30.0–36.0)
MCV: 83.5 fL (ref 78.0–100.0)
Monocytes Absolute: 1.3 10*3/uL — ABNORMAL HIGH (ref 0.1–1.0)
Monocytes Relative: 9 %
Neutro Abs: 10.4 10*3/uL — ABNORMAL HIGH (ref 1.7–7.7)
Neutrophils Relative %: 72 %
Platelets: 350 10*3/uL (ref 150–400)
RBC: 4.62 MIL/uL (ref 3.87–5.11)
RDW: 13.8 % (ref 11.5–15.5)
WBC: 14.5 10*3/uL — ABNORMAL HIGH (ref 4.0–10.5)

## 2016-10-31 NOTE — Progress Notes (Signed)
Physical Therapy Session Note  Patient Details  Name: [name] [name] MRN: [id number] Date of Birth: 02-15-1961  Today's Date: 10/31/2016 PT Individual Time: 1110-1204 PT Individual Time Calculation (min): 54 min    Short Term Goals: Week 1:  PT Short Term Goal 1 (Week 1): Pt will be able to demonstrate increased endurance with gait >= 100' with LRAD and steady assist PT Short Term Goal 2 (Week 1): Pt will improve score on Berg Balance Test by 5 points to demonstrate decreased fall risk PT Short Term Goal 3 (Week 1): Pt will demonstrate increased attention to L during functional tasks with min verbal cues needed  Skilled Therapeutic Interventions/Progress Updates:  Pt received seated in recliner resting; pt still with very flat affect.  Pt denies pain.  Pt performed ambulation from recliner > w/c with L HHA and min A.  Attempted w/c mobility training x 100' with bilat UE propulsion for use and attention to LUE, L environment, attention to task and sequencing.  Pt required max A to perform and maintain straight navigation.  Performed stair negotiation training up/down 12 stairs with bilat UE support on rails and min A with focus on use of alternating stepping sequence for balance, weight shifting, strengthening, reciprocal pattern and attention to LUE and LLE.  Performed gait backwards in hallway with min A x 100' with pt performing L and R head turns to locate playing cards and match to cards in LUE; pt missed 2/3 on L side but able to match appropriately and locate all cards.  Performed bilat UE and LE strengthening, endurance training, attention to task, reciprocal movement/coordination of LUE and LLE on Nustep at level 5 x 7 minutes with bilat UE and LE and 3 min with LE only.  Returned to room and pt transferred back to recliner with min HHA; pt left with quick release belt and all items within reach.  Therapy Documentation Precautions:  Precautions Precautions: Fall Precaution Comments: L  inattention Restrictions Weight Bearing Restrictions: No Pain: Pain Assessment Pain Assessment: No/denies pain Pain Score: 0-No pain   See Function Navigator for Current Functional Status.   Therapy/Group: Individual Therapy  Raylene Everts [facility] 10/31/2016, 12:24 PM

## 2016-10-31 NOTE — Progress Notes (Signed)
[place] PHYSICAL MEDICINE & REHABILITATION     PROGRESS NOTE  Subjective/Complaints:  Pt laying in bed this AM.  She slept well overnight.  She states she is "ready to go home".  ROS: Denies CP, SOB, N/V/D.  Objective: Vital Signs: Blood pressure 124/64, pulse 74, temperature 98.4 F (36.9 C), temperature source Oral, resp. rate 18, SpO2 97 %. Dg Chest 2 View  Result Date: 10/30/2016 CLINICAL DATA:  Known basilar tip aneurysm clipping EXAM: CHEST  2 VIEW COMPARISON:  07/09/2016 FINDINGS: Cardiac shadow is stable. The lungs are well aerated. A ventriculoperitoneal shunt catheter is noted and intact. No focal infiltrate is seen. No bony abnormality is noted. IMPRESSION: No acute abnormality noted. Electronically Signed   By: Inez Catalina M.D.   On: 10/30/2016 15:39    Recent Labs  10/30/16 0528 10/31/16 0657  WBC 11.9* 14.5*  HGB 12.8 12.4  HCT 39.5 38.6  PLT 319 350    Recent Labs  10/30/16 0528  NA 137  K 4.3  CL 108  GLUCOSE 100*  BUN 21*  CREATININE 1.11*  CALCIUM 9.2   CBG (last 3)  No results for input(s): GLUCAP in the last 72 hours.  Wt Readings from Last 3 Encounters:  10/29/16 89.4 kg (197 lb)  10/20/16 89.5 kg (197 lb 4.8 oz)  07/13/16 85.7 kg (189 lb)    Physical Exam:  BP 124/64 (BP Location: Right Arm)   Pulse 74   Temp 98.4 F (36.9 C) (Oral)   Resp 18   SpO2 97%  Constitutional: She appears well-developed and well-nourished. NAD. HENT: Normocephalic. Well healed old left crani incision and right crani incision C/D/I with staples in place.   Eyes: Cloudy sclera. Right ptosis.  Cardiovascular: Regular rate. Regular rhythm.  No JVD. Respiratory: Effort normal and breath sounds normal. No respiratory distress.  GI: Soft. Bowel sounds are normal. She exhibits no distension. There is no tenderness.  Musculoskeletal: No edema or tenderness in extremitites  Neurological: She is alert and oriented.  Left inattention.  Right ptosis and left facial  weakness.  Does not have good awareness of deficits.  Motor: 4+/5 grossly throughout  Skin: Skin is warm and dry.  Psychiatric: Her affect is blunt. Her speech is delayed. She is slowed   Assessment/Plan: 1. 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 Physiatrist is providing close team supervision and 24 hour management of active medical problems listed below. Physiatrist and rehab team continue to assess barriers to discharge/monitor patient progress toward functional and medical goals.  Function:  Bathing Bathing position Bathing activity did not occur: Refused    Bathing parts      Bathing assist        Upper Body Dressing/Undressing Upper body dressing   What is the patient wearing?: Hospital gown                Upper body assist Assist Level: Set up, Supervision or verbal cues   Set up : To obtain clothing/put away  Lower Body Dressing/Undressing Lower body dressing   What is the patient wearing?: Hospital Gown, Non-skid slipper socks           Non-skid slipper socks- Performed by helper: Don/doff right sock, Don/doff left sock  Lower body assist        Toileting Toileting   Toileting steps completed by patient: Adjust clothing prior to toileting, Performs perineal hygiene, Adjust clothing after toileting   Toileting Assistive Devices: Grab bar or rail  Toileting assist Assist level: Touching or steadying assistance (Pt.75%)   Transfers Chair/bed transfer   Chair/bed transfer method: Stand pivot Chair/bed transfer assist level: Touching or steadying assistance (Pt > 75%) Chair/bed transfer assistive device: Armrests     Locomotion Ambulation     Max distance: 37' Assist level: Touching or steadying assistance (Pt > 75%)   Wheelchair        Assist Level: Dependent (Pt equals 0%)  Cognition Comprehension Comprehension assist level: Understands basic 90% of the time/cues < 10% of the time  Expression Expression assist level: Expresses basic 75 - 89% of the time/requires cueing 10 - 24% of the time. Needs helper to occlude trach/needs to repeat words.  Social Interaction Social Interaction assist level: Interacts appropriately 50 - 74% of the time - May be physically or verbally inappropriate.  Problem Solving Problem solving assist level: Solves basic 50 - 74% of the time/requires cueing 25 - 49% of the time  Memory Memory assist level: Recognizes or recalls 50 - 74% of the time/requires cueing 25 - 49% of the time    Medical Problem List and Plan: 1.  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   Cont CIR 2.  DVT Prophylaxis/Anticoagulation: Mechanical: Sequential compression devices, below knee Bilateral lower extremities 3. Pain Management: Will continue Hydrocodone prn for now--denies any pain.  4. Mood: LCSW to follow for evaluation and support.  5. Neuropsych: This patient is not fully capable of making decisions on her own behalf. 6. Skin/Wound Care: Monitor incision for healing. Maintain adequate nutritional and hydration status.  7. Fluids/Electrolytes/Nutrition: Monitor I/O.  8. Seizure prophylaxis: On keppra.  9. H/o SVT/WPW s/p ablation: Monitor HR bid 10. Hypokalemia: ?Resolved  K+4.3 on 11/9  Labs ordered for Monday 11. Leukocytosis:   WBCs 14.5 on 11/10  Cont to monitor  Labs ordered for Monday  CXR 11/9 reviewed, unremarkable for acute process  UA/Ucx ordered 12. AKI: Avoid nephrotoxic meds.   Cr 1.11 on 11/9  Encourage fluid intake  Labs ordered for Monday 13. ABLA: ?Resolved  Hb 12.8 on 11/9  Labs ordered for Monday 14. Bradycardia: Resolved at present  Will cont to  monitor   LOS (Days) 2 A FACE TO FACE EVALUATION WAS PERFORMED  Sarika Baldini Lorie Phenix 10/31/2016 8:29 AM

## 2016-10-31 NOTE — Progress Notes (Signed)
Occupational Therapy Session Note  Patient Details  Name: DONNALYNN SIRLES MRN: [id number] Date of Birth: [birth year]  Today's Date: 10/31/2016 OT Individual Time: [id number] and [id number] OT Individual Time Calculation (min): 58 min and 28 minutes  Short Term Goals: Week 1:  OT Short Term Goal 1 (Week 1): Pt will perform toilet transfer with min A in order to increase I with functional transfers.  OT Short Term Goal 2 (Week 1): Pt will perform LB dressing with min Ain order to increase I with functional transfers.  OT Short Term Goal 3 (Week 1): Pt will locate items to the L during functional tasks with min cues. OT Short Term Goal 4 (Week 1): Pt will utilize L UE during bilateral coordination tasks with min verbal cues to attend to UE.  Skilled Therapeutic Interventions/Progress Updates:   Skilled OT session completed with focus on initiation/termination, L UE NMR, safety awareness, and self sequencing during self care completion. Pt was lying in bed at time of arrival and agreeable to tx. Stand pivot from bed to w/c completed with min guard and instruction on hand placement. Mod-max cues required for sequencing during bathing with pt often perseverating on washing head and right leg. Tactile cues provided to raise left sided awareness with pt bathing right side instead when cued to attend to left. Mod cues for bilateral integration for L UE NMR. Steady assist for standing as needed for pericare. Pt then reported that she needed to void, stand pivot completed with min guard to toilet. Pt able to complete toileting with min guard. Pt completed oral care/grooming tasks at sink with mod cues for sequencing and terminating tasks. Dressing items placed on left side to encourage scanning with min-mod cues required. Min A required for donning hospital gown secondary to pt continuing to place UEs through holes between snap buttons. Pt was transferred to recliner at end of session and was left with all needs  within reach, L UE elevated and safety belt in place.   2nd Session 1:1 Tx (28 minutes) Pt reported needing to void at time of arrival. Stand pivot from bed<w/c<toilet completed with min guard and instruction on safe hand placement. Afterwards pt was taken to brain injury gym to complete dynavision task. Program modified to address left inattention/neglect. Therapeutic use of Roe Rutherford music utilized to increase motivation. Pt exhibited sustained attention to task while standing. Min cues required at first to attend to left periphery, but in time, pt actively scanned to outermost left visual field without cuing. Reaction time improved from 9.56 sec to 7.25 sec in 2 trials. Pt was then taken back to room and transferred to bed. All needs within reach and bed alarm activated at time of departure.   Therapy Documentation Precautions:  Precautions Precautions: Fall Precaution Comments: L inattention Restrictions Weight Bearing Restrictions: No   Pain: No c/o pain during sessions  Pain Assessment Pain Assessment: No/denies pain Pain Score: 0-No pain ADL:  :    See Function Navigator for Current Functional Status.   Therapy/Group: Individual Therapy  Sheniece Ruggles A Newman Waren 10/31/2016, 12:55 PM

## 2016-10-31 NOTE — Progress Notes (Signed)
Speech Language Pathology Daily Session Note  Patient Details  Name: [name] [name] MRN: [id number] Date of Birth: [birth year]  Today's Date: 10/31/2016 SLP Individual Time: 1300-1345 SLP Individual Time Calculation (min): 45 min   Short Term Goals: Week 1: SLP Short Term Goal 1 (Week 1): Pt will sustain her attention to basic familiar tasks for 2-3 minutes with min assist verbal cues for redirection.   SLP Short Term Goal 2 (Week 1): Pt will complete basic, familiar tasks with min assist verbal cues for functional problem solving. SLP Short Term Goal 3 (Week 1): Pt will attend to left upper extremity and the environment during basic tasks with min assist verbal cues.  SLP Short Term Goal 4 (Week 1): Pt will utilize external aids to facilitate recall of daily information with mod assist verbal cues.    Skilled Therapeutic Interventions:Skilled treatment session focused on cognitive goals. SLP facilitated session by initially providing overall Mod A verbal and visual cues which increased to Max A by end of session for problem solving and recall during a basic money management task.  Patient was able to demonstrate selective attention to task in a moderately distracting environment for ~20 minutes with Min A verbal cues. Patient observed eating chips and crackers throughout session without overt s/s of aspiration. Patient's family educated on importance of limiting distractions while eating and removing food items out of the patient's reach when staff or family are not present in room to maximize safety. All verbalized understanding and agreement. Patient left upright in recliner with family present and quick release belt in place. Continue with current plan of care.   Function:   Cognition Comprehension Comprehension assist level: Understands basic 90% of the time/cues < 10% of the time  Expression   Expression assist level: Expresses basic 75 - 89% of the time/requires cueing 10 - 24% of the  time. Needs helper to occlude trach/needs to repeat words.  Social Interaction Social Interaction assist level: Interacts appropriately 50 - 74% of the time - May be physically or verbally inappropriate.  Problem Solving Problem solving assist level: Solves basic 50 - 74% of the time/requires cueing 25 - 49% of the time  Memory Memory assist level: Recognizes or recalls 50 - 74% of the time/requires cueing 25 - 49% of the time    Pain Pain Assessment Pain Assessment: No/denies pain  Therapy/Group: Individual Therapy  Ilana Prezioso, [place] 10/31/2016, 3:17 PM

## 2016-11-01 ENCOUNTER — Inpatient Hospital Stay (HOSPITAL_COMMUNITY): Payer: Medicaid Other | Admitting: Occupational Therapy

## 2016-11-01 ENCOUNTER — Inpatient Hospital Stay (HOSPITAL_COMMUNITY): Payer: Medicaid Other | Admitting: Speech Pathology

## 2016-11-01 ENCOUNTER — Inpatient Hospital Stay (HOSPITAL_COMMUNITY): Payer: Medicaid Other | Admitting: Physical Therapy

## 2016-11-01 LAB — URINE CULTURE

## 2016-11-01 NOTE — Progress Notes (Signed)
Occupational Therapy Session Note  Patient Details  Name: [name] [name] MRN: [id number] Date of Birth: January 04, 1961  Today's Date: 11/01/2016 OT Individual Time: [id number] and [id number] OT Individual Time Calculation (min): 43 min and 61 minutes   Short Term Goals: Week 1:  OT Short Term Goal 1 (Week 1): Pt will perform toilet transfer with min A in order to increase I with functional transfers.  OT Short Term Goal 2 (Week 1): Pt will perform LB dressing with min Ain order to increase I with functional transfers.  OT Short Term Goal 3 (Week 1): Pt will locate items to the L during functional tasks with min cues. OT Short Term Goal 4 (Week 1): Pt will utilize L UE during bilateral coordination tasks with min verbal cues to attend to UE.  Skilled Therapeutic Interventions/Progress Updates:   Pt completed ADLs w/c level at sink with setup. Tx focus included L UE NMR, left body awareness, initiation/termination, and visual field scanning. Pt required overall steady assist for self care with mod questioning cues regarding initiation and self sequencing. With extra time pt able to don socks with cues for bilaterally integrating UEs. Pt continued to drop wash cloth when washing feet but exhibited increased safety awareness by telling therapist it had fell instead of trying to retrieve it herself. Lotion application used for self identification of body areas and body awareness.  Toileting and transfer completed via stand pivot with overall steady assist and instruction on hand placement. At end of session pt was left in w/c with all needs within reach. Safety belt in place.   Session 2 1:1 tx (43 minutes)  Skilled OT session completed with focus on L UE NMR and left visual field scanning, Pt completed w/c self propulsion halfway down hallway with max vcs for avoiding barriers on left side and for using UEs in unision. Fair carryover regarding education on technique. Pt was brought the rest of the way to  therapy gym. When activity was set up, pt reported needing to void. Toileting completed with steady assist to elevated toilet in therapy apartment. Cues for attending to toilet paper on left side. Pt then returned to gym and completed various theraputty exercises with max cues for attending to task and using L UE. Pt able to recall shapes that therapist requested her to make out of putty. At end of tx pt was returned to bed and left with all needs within reach. Bed alarm activated.   Therapy Documentation Precautions:  Precautions Precautions: Fall Precaution Comments: L inattention Restrictions Weight Bearing Restrictions: No General:   Vital Signs: Therapy Vitals Temp: 98.4 F (36.9 C) Temp Source: Oral Pulse Rate: 65 Resp: 18 BP: 121/68 Patient Position (if appropriate): Lying Oxygen Therapy SpO2: 98 % O2 Device: Not Delivered Pain: No c/o pain during sessions    ADL:      See Function Navigator for Current Functional Status.   Therapy/Group: Individual Therapy  Maren Wiesen A Zaneta [place] 11/01/2016, 4:17 PM

## 2016-11-01 NOTE — Progress Notes (Signed)
Patient alert. Patient participated in therapy. Patient did well with her feedings while been supervised. Patient family visited. Patient used her cell phone to call family. Staff will continue to monitor and needs.

## 2016-11-01 NOTE — Progress Notes (Signed)
[place] PHYSICAL MEDICINE & REHABILITATION     PROGRESS NOTE  Subjective/Complaints:  Appears tired, sitting in wheelchair this morning has eaten breakfast, no complaints..  ROS: Denies CP, SOB, N/V/D.  Objective: Vital Signs: Blood pressure (!) 110/54, pulse (!) 51, temperature 98.4 F (36.9 C), temperature source Oral, resp. rate 18, SpO2 99 %. Dg Chest 2 View  Result Date: 10/30/2016 CLINICAL DATA:  Known basilar tip aneurysm clipping EXAM: CHEST  2 VIEW COMPARISON:  07/09/2016 FINDINGS: Cardiac shadow is stable. The lungs are well aerated. A ventriculoperitoneal shunt catheter is noted and intact. No focal infiltrate is seen. No bony abnormality is noted. IMPRESSION: No acute abnormality noted. Electronically Signed   By: Inez Catalina M.D.   On: 10/30/2016 15:39    Recent Labs  10/30/16 0528 10/31/16 0657  WBC 11.9* 14.5*  HGB 12.8 12.4  HCT 39.5 38.6  PLT 319 350    Recent Labs  10/30/16 0528  NA 137  K 4.3  CL 108  GLUCOSE 100*  BUN 21*  CREATININE 1.11*  CALCIUM 9.2   CBG (last 3)  No results for input(s): GLUCAP in the last 72 hours.  Wt Readings from Last 3 Encounters:  10/29/16 89.4 kg (197 lb)  10/20/16 89.5 kg (197 lb 4.8 oz)  07/13/16 85.7 kg (189 lb)    Physical Exam:  BP (!) 110/54 (BP Location: Left Arm)   Pulse (!) 51   Temp 98.4 F (36.9 C) (Oral)   Resp 18   SpO2 99%  Constitutional: She appears well-developed and well-nourished. NAD. HENT: Normocephalic. Well healed old left crani incision and right crani incision C/D/I with staples in place.   Eyes: Cloudy sclera. Right ptosis.  Cardiovascular: Regular rate. Regular rhythm.  No JVD. Respiratory: Effort normal and breath sounds normal. No respiratory distress.  GI: Soft. Bowel sounds are normal. She exhibits no distension. There is no tenderness.  Musculoskeletal: No edema or tenderness in extremitites  Neurological: She is alert and oriented.  Left inattention.  Right ptosis and  left facial weakness.  Does not have good awareness of deficits.  Motor: 4+/5 grossly throughout  Skin: Skin is warm and dry.  Psychiatric: Her affect is blunt. Her speech is delayed. She is slowed   Assessment/Plan: 1. 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 Physiatrist is providing close team supervision and 24 hour management of active medical problems listed below. Physiatrist and rehab team continue to assess barriers to discharge/monitor patient progress toward functional and medical goals.  Function:  Bathing Bathing position Bathing activity did not occur: Refused Position: Wheelchair/chair at sink  Bathing parts Body parts bathed by patient: Right arm, Left arm, Chest, Abdomen, Front perineal area, Buttocks, Right upper leg, Left upper leg, Right lower leg, Left lower leg Body parts bathed by helper: Back  Bathing assist Assist Level: Touching or steadying assistance(Pt > 75%)      Upper Body Dressing/Undressing Upper body dressing   What is the patient wearing?: Hospital gown                Upper body assist Assist Level: Touching or steadying assistance(Pt > 75%)   Set up : To obtain clothing/put away  Lower Body Dressing/Undressing Lower body dressing   What is the patient wearing?: Hospital Gown, Non-skid slipper socks  Non-skid slipper socks- Performed by patient: Don/doff right sock, Don/doff left sock Non-skid slipper socks- Performed by helper: Don/doff right sock, Don/doff left sock                  Lower body assist Assist for lower body dressing: Touching or steadying assistance (Pt > 75%)      Toileting Toileting   Toileting steps completed by patient: Adjust clothing prior to toileting, Performs perineal hygiene   Toileting Assistive  Devices: Grab bar or rail  Toileting assist Assist level: Touching or steadying assistance (Pt.75%)   Transfers Chair/bed transfer   Chair/bed transfer method: Stand pivot Chair/bed transfer assist level: Touching or steadying assistance (Pt > 75%) Chair/bed transfer assistive device: Armrests     Locomotion Ambulation     Max distance: 150 Assist level: Touching or steadying assistance (Pt > 75%)   Wheelchair   Type: Manual Max wheelchair distance: 25 Assist Level: Touching or steadying assistance (Pt > 75%)  Cognition Comprehension Comprehension assist level: Understands basic 90% of the time/cues < 10% of the time  Expression Expression assist level: Expresses basic 75 - 89% of the time/requires cueing 10 - 24% of the time. Needs helper to occlude trach/needs to repeat words.  Social Interaction Social Interaction assist level: Interacts appropriately 50 - 74% of the time - May be physically or verbally inappropriate.  Problem Solving Problem solving assist level: Solves basic 50 - 74% of the time/requires cueing 25 - 49% of the time  Memory Memory assist level: Recognizes or recalls 50 - 74% of the time/requires cueing 25 - 49% of the time    Medical Problem List and Plan: 1.  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   Cont CIR, PT, OT, speech 2.  DVT Prophylaxis/Anticoagulation: Mechanical: Sequential compression devices, below knee Bilateral lower extremities 3. Pain Management: Will continue Hydrocodone prn for now--denies any pain.  4. Mood: LCSW to follow for evaluation and support.  5. Neuropsych: This patient is not fully capable of making decisions on her own behalf. 6. Skin/Wound Care: Monitor incision for healing. Maintain adequate nutritional and hydration status.  7. Fluids/Electrolytes/Nutrition: Monitor I/O.  8. Seizure prophylaxis:  On keppra.  9. H/o SVT/WPW s/p ablation: Monitor HR bid 10. Hypokalemia: ?Resolved  K+4.3 on 11/9  Labs ordered for Monday 11. Leukocytosis: Afebrile  WBCs 14.5 on 11/10  Cont to monitor  Labs ordered for Monday  CXR 11/9 reviewed, unremarkable for acute process  UA/Ucx ordered 12. AKI: Avoid nephrotoxic meds.   Cr 1.11 on 11/9  Encourage fluid intake  Labs ordered for Monday 13. ABLA: ?Resolved  Hb 12.8 on 11/9  Labs ordered for Monday 14. Bradycardia: Resolved at present  Will cont to monitor   LOS (Days) 3 A FACE TO FACE EVALUATION WAS PERFORMED  Charlett Blake 11/01/2016 10:57 AM

## 2016-11-01 NOTE — Progress Notes (Signed)
Speech Language Pathology Daily Session Note  Patient Details  Name: [name] [name] MRN: [id number] Date of Birth: 18-Dec-1961  Today's Date: 11/01/2016 SLP Individual Time: 1115-1200 SLP Individual Time Calculation (min): 45 min   Short Term Goals: Week 1: SLP Short Term Goal 1 (Week 1): Pt will sustain her attention to basic familiar tasks for 2-3 minutes with min assist verbal cues for redirection.   SLP Short Term Goal 2 (Week 1): Pt will complete basic, familiar tasks with min assist verbal cues for functional problem solving. SLP Short Term Goal 3 (Week 1): Pt will attend to left upper extremity and the environment during basic tasks with min assist verbal cues.  SLP Short Term Goal 4 (Week 1): Pt will utilize external aids to facilitate recall of daily information with mod assist verbal cues.    Skilled Therapeutic Interventions:Skilled treatment session focused on cognitive goals. SLP facilitated session by providing Max A verbal, question and visual cues for problem solving and attention to left field of environment during a 4 step picture sequencing task and a calendar making task. Patient required overall Min A verbal cues for sustained attention to tasks for ~10 minutes. Patient also required total A to recall events from previous therapy sessions and required Max A multimodal cues to utilize TV remote accurately at end of session. Patient left upright in wheelchair with quick release belt in place and all needs within reach. Continue with current plan of care.   Function:  Cognition Comprehension Comprehension assist level: Understands basic 75 - 89% of the time/ requires cueing 10 - 24% of the time  Expression   Expression assist level: Expresses basic 75 - 89% of the time/requires cueing 10 - 24% of the time. Needs helper to occlude trach/needs to repeat words.  Social Interaction Social Interaction assist level: Interacts appropriately 50 - 74% of the time - May be physically  or verbally inappropriate.  Problem Solving Problem solving assist level: Solves basic 25 - 49% of the time - needs direction more than half the time to initiate, plan or complete simple activities  Memory Memory assist level: Recognizes or recalls 25 - 49% of the time/requires cueing 50 - 75% of the time    Pain Pain Assessment Pain Assessment: No/denies pain  Therapy/Group: Individual Therapy  Stana Bayon 11/01/2016, 12:44 PM

## 2016-11-01 NOTE — Progress Notes (Signed)
Physical Therapy Session Note  Patient Details  Name: [name] [name] MRN: [id number] Date of Birth: 12-27-1960  Today's Date: 11/01/2016 PT Individual Time: [id number] PT Individual Time Calculation (min): 60 min    Short Term Goals: Week 1:  PT Short Term Goal 1 (Week 1): Pt will be able to demonstrate increased endurance with gait >= 100' with LRAD and steady assist PT Short Term Goal 2 (Week 1): Pt will improve score on Berg Balance Test by 5 points to demonstrate decreased fall risk PT Short Term Goal 3 (Week 1): Pt will demonstrate increased attention to L during functional tasks with min verbal cues needed  Skilled Therapeutic Interventions/Progress Updates:    Pt was seen bedside in the am. Pt propelled w/c about 25 feet with B UEs and min A with verbal cues due to L inattention. Pt performed multiple sit to stand transfers without assistive device and min guard. Pt performed step taps and alternating step taps 3 sets x 10 reps each for NMR. Pt rode Nu-step x 5 minutes at level 3. Pt ambulated 150 feet x 2 without assistive device and min A with verbal cues. Pt returned to room and left sitting up in w/c with quick release belt in place.   Therapy Documentation Precautions:  Precautions Precautions: Fall Precaution Comments: L inattention Restrictions Weight Bearing Restrictions: No General:   Pain: No c/o pain.  See Function Navigator for Current Functional Status.   Therapy/Group: Individual Therapy  Dub Amis 11/01/2016, 12:27 PM

## 2016-11-02 ENCOUNTER — Inpatient Hospital Stay (HOSPITAL_COMMUNITY): Payer: Medicaid Other | Admitting: Physical Therapy

## 2016-11-02 NOTE — Progress Notes (Signed)
[place] PHYSICAL MEDICINE & REHABILITATION     PROGRESS NOTE  Subjective/Complaints:  No issues overnight  ROS: Denies CP, SOB, N/V/D.  Objective: Vital Signs: Blood pressure (!) 115/56, pulse (!) 56, temperature 98.5 F (36.9 C), temperature source Oral, resp. rate 18, weight 90.2 kg (198 lb 13.7 oz), SpO2 100 %. No results found.  Recent Labs  10/31/16 0657  WBC 14.5*  HGB 12.4  HCT 38.6  PLT 350   No results for input(s): NA, K, CL, GLUCOSE, BUN, CREATININE, CALCIUM in the last 72 hours.  Invalid input(s): CO CBG (last 3)  No results for input(s): GLUCAP in the last 72 hours.  Wt Readings from Last 3 Encounters:  11/01/16 90.2 kg (198 lb 13.7 oz)  10/29/16 89.4 kg (197 lb)  10/20/16 89.5 kg (197 lb 4.8 oz)    Physical Exam:  BP (!) 115/56 (BP Location: Left Arm)   Pulse (!) 56   Temp 98.5 F (36.9 C) (Oral)   Resp 18   Wt 90.2 kg (198 lb 13.7 oz)   SpO2 100%   BMI 35.23 kg/m  Constitutional: She appears well-developed and well-nourished. NAD. HENT: Normocephalic. Well healed old left crani incision and right crani incision C/D/I with staples in place.   Eyes: Cloudy sclera. Right ptosis.  Cardiovascular: Regular rate. Regular rhythm.  No JVD. Respiratory: Effort normal and breath sounds normal. No respiratory distress.  GI: Soft. Bowel sounds are normal. She exhibits no distension. There is no tenderness.  Musculoskeletal: No edema or tenderness in extremitites  Neurological: She is alert and oriented.  Left inattention.  Right ptosis and left facial weakness.  Does not have good awareness of deficits.  Motor: 4+/5 grossly throughout  Skin: Skin is warm and dry.  Psychiatric: Her affect is blunt. Her speech is delayed. She is slowed   Assessment/Plan: 1. 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 Physiatrist is providing close team supervision and 24 hour management of active medical problems listed below. Physiatrist and rehab team continue to assess barriers to discharge/monitor patient progress toward functional and medical goals.  Function:  Bathing Bathing position Bathing activity did not occur: Refused Position: Wheelchair/chair at sink  Bathing parts Body parts bathed by patient: Right arm, Left arm, Chest, Abdomen, Front perineal area, Buttocks, Right upper leg, Left upper leg, Right lower leg, Left lower leg Body parts bathed by helper: Back  Bathing assist Assist Level: Touching or steadying assistance(Pt > 75%)      Upper Body Dressing/Undressing Upper body dressing   What is the patient wearing?: Hospital gown                Upper body assist Assist Level: Touching or steadying assistance(Pt > 75%)   Set up : To obtain clothing/put away  Lower Body Dressing/Undressing Lower body dressing   What is the patient wearing?: [place], Non-skid slipper socks Underwear - Performed by patient: Thread/unthread right underwear leg, Thread/unthread left underwear leg, Pull underwear up/down       Non-skid slipper socks- Performed by patient: Don/doff right sock, Don/doff left sock Non-skid slipper socks- Performed by helper: Don/doff right sock, Don/doff left sock                  Lower body assist Assist for lower body dressing: Touching or steadying assistance (Pt >  75%)      Toileting Toileting   Toileting steps completed by patient: Adjust clothing prior to toileting, Performs perineal hygiene   Toileting Assistive Devices: Grab bar or rail  Toileting assist Assist level: Touching or steadying assistance (Pt.75%)   Transfers Chair/bed transfer   Chair/bed transfer method: Stand pivot Chair/bed transfer assist level: Touching or steadying assistance (Pt > 75%) Chair/bed transfer assistive  device: Armrests     Locomotion Ambulation     Max distance: 150 Assist level: Touching or steadying assistance (Pt > 75%)   Wheelchair   Type: Manual Max wheelchair distance: 25 Assist Level: Touching or steadying assistance (Pt > 75%)  Cognition Comprehension Comprehension assist level: Understands basic 90% of the time/cues < 10% of the time  Expression Expression assist level: Expresses basic 75 - 89% of the time/requires cueing 10 - 24% of the time. Needs helper to occlude trach/needs to repeat words.  Social Interaction Social Interaction assist level: Interacts appropriately 50 - 74% of the time - May be physically or verbally inappropriate.  Problem Solving Problem solving assist level: Solves basic 50 - 74% of the time/requires cueing 25 - 49% of the time  Memory Memory assist level: Recognizes or recalls 50 - 74% of the time/requires cueing 25 - 49% of the time    Medical Problem List and Plan: 1.  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   Cont CIR, PT, OT, speech, Participating well with PT this morning 2.  DVT Prophylaxis/Anticoagulation: Mechanical: Sequential compression devices, below knee Bilateral lower extremities 3. Pain Management: Will continue Hydrocodone prn for now--denies any pain.  4. Mood: LCSW to follow for evaluation and support.  5. Neuropsych: This patient is not fully capable of making decisions on her own behalf. 6. Skin/Wound Care: Monitor incision for healing. Maintain adequate nutritional and hydration status.  7. Fluids/Electrolytes/Nutrition: Monitor I/O.  8. Seizure prophylaxis: On keppra.  9. H/o SVT/WPW s/p ablation: Monitor HR bid 10. Hypokalemia: ?Resolved  K+4.3 on 11/9  Labs ordered for Monday 11. Leukocytosis: Afebrile  WBCs 14.5 on 11/10  Cont to monitor  Labs ordered for Monday  CXR 11/9 reviewed,  unremarkable for acute process  UA/Ucx ordered 12. AKI: Avoid nephrotoxic meds.   Cr 1.11 on 11/9  Encourage fluid intake  Labs ordered for Monday 13. ABLA: ?Resolved  Hb 12.8 on 11/9  Labs ordered for Monday 14. Bradycardia: Resolved at present  Will cont to monitor   LOS (Days) 4 A FACE TO FACE EVALUATION WAS PERFORMED  Alysia Penna E 11/02/2016 11:01 AM

## 2016-11-02 NOTE — Progress Notes (Signed)
Physical Therapy Note  Patient Details  Name: [name] [name] MRN: [id number] Date of Birth: 27-Aug-1961 Today's Date: 11/02/2016    Time: 1000-1055 55 minutes  1:1 No c/o pain. Pt in bed agreeable to PT session.  Standing balance for dressing tasks with close supervision, increased time and cuing for pt to orient clothing and problem solve dressing upper and lower body. Gait in room to bathroom and sink with min A for balance, cues for Lt attention and initiation with handwashing.  Gait throughout unit with min A, cues for attention to task and for obstacle negotiation.  Standing balance on compliant surface with squatting and reaching task in all directions with min A.  Card matching task in Lt visual field with supervision.  Ramp and curb negotiation and gait on uneven surface with min A.  Pt requests to make a cup of coffee. Pt requires max visual and verbal cues to follow directions on Keurig to make coffee, cues for Lt attention and safety with adding sugar and cream.  Pt with decreased awareness of deficits, improving balance and gait.   Kimberlyn Quiocho 11/02/2016, 10:57 AM

## 2016-11-03 ENCOUNTER — Inpatient Hospital Stay (HOSPITAL_COMMUNITY): Payer: Medicaid Other | Admitting: Speech Pathology

## 2016-11-03 ENCOUNTER — Inpatient Hospital Stay (HOSPITAL_COMMUNITY): Payer: Medicaid Other | Admitting: Occupational Therapy

## 2016-11-03 ENCOUNTER — Inpatient Hospital Stay (HOSPITAL_COMMUNITY): Payer: Medicaid Other

## 2016-11-03 DIAGNOSIS — I63311 Cerebral infarction due to thrombosis of right middle cerebral artery: Secondary | ICD-10-CM

## 2016-11-03 LAB — CBC WITH DIFFERENTIAL/PLATELET
Basophils Absolute: 0 10*3/uL (ref 0.0–0.1)
Basophils Relative: 0 %
Eosinophils Absolute: 0.1 10*3/uL (ref 0.0–0.7)
Eosinophils Relative: 0 %
HCT: 37.4 % (ref 36.0–46.0)
Hemoglobin: 11.7 g/dL — ABNORMAL LOW (ref 12.0–15.0)
Lymphocytes Relative: 16 %
Lymphs Abs: 2 10*3/uL (ref 0.7–4.0)
MCH: 26.6 pg (ref 26.0–34.0)
MCHC: 31.3 g/dL (ref 30.0–36.0)
MCV: 85 fL (ref 78.0–100.0)
Monocytes Absolute: 0.9 10*3/uL (ref 0.1–1.0)
Monocytes Relative: 7 %
Neutro Abs: 9.4 10*3/uL — ABNORMAL HIGH (ref 1.7–7.7)
Neutrophils Relative %: 77 %
Platelets: 352 10*3/uL (ref 150–400)
RBC: 4.4 MIL/uL (ref 3.87–5.11)
RDW: 14.6 % (ref 11.5–15.5)
WBC: 12.4 10*3/uL — ABNORMAL HIGH (ref 4.0–10.5)

## 2016-11-03 LAB — BASIC METABOLIC PANEL
Anion gap: 7 (ref 5–15)
BUN: 17 mg/dL (ref 6–20)
CO2: 23 mmol/L (ref 22–32)
Calcium: 9.1 mg/dL (ref 8.9–10.3)
Chloride: 110 mmol/L (ref 101–111)
Creatinine, Ser: 1.09 mg/dL — ABNORMAL HIGH (ref 0.44–1.00)
GFR calc Af Amer: >60 mL/min (ref >60)
GFR calc non Af Amer: 56 mL/min — ABNORMAL LOW (ref >60)
Glucose, Bld: 105 mg/dL — ABNORMAL HIGH (ref 65–99)
Potassium: 4.1 mmol/L (ref 3.5–5.1)
Sodium: 140 mmol/L (ref 135–145)

## 2016-11-03 MED FILL — Thrombin For Soln 20000 Unit: CUTANEOUS | Qty: 1 | Status: AC

## 2016-11-03 NOTE — Progress Notes (Signed)
Physical Therapy Session Note  Patient Details  Name: [name] [name] MRN: [id number] Date of Birth: 1961/11/22  Today's Date: 11/03/2016 PT Individual Time: [id number] PT Individual Time Calculation (min): 75 min    Short Term Goals: Week 1:  PT Short Term Goal 1 (Week 1): Pt will be able to demonstrate increased endurance with gait >= 100' with LRAD and steady assist PT Short Term Goal 2 (Week 1): Pt will improve score on Berg Balance Test by 5 points to demonstrate decreased fall risk PT Short Term Goal 3 (Week 1): Pt will demonstrate increased attention to L during functional tasks with min verbal cues needed  Skilled Therapeutic Interventions/Progress Updates:    Session focused on functional gait and transfers without AD (supervision to min assist), neuro re-ed for balance retraining with focus on L attention and cognitive retraining while on compliant surface, Nustep for reciprocal movement pattern retraining and attention task (goal for time and visual goal to attend to L hand with yellow tape on handle) x 10 min on level 4, overall endurance/activity tolerance, and pathfinding/memory activity in regards to location of her new room (moved from [place] to [place] of unit). Pt engaged in cognitive activities while completing balance tasks including Wii Bowling and Tennis and Connect 4 - pt required mod verbal cues for problem solving, attention, awareness, and min for initiation during therapy session.    Therapy Documentation Precautions:  Precautions Precautions: Fall Precaution Comments: L inattention Restrictions Weight Bearing Restrictions: No   Pain: Denies pain.    See Function Navigator for Current Functional Status.   Therapy/Group: Individual Therapy  [name] [name], PT, DPT   11/03/2016, 2:24 PM

## 2016-11-03 NOTE — Progress Notes (Signed)
Speech Language Pathology Daily Session Note  Patient Details  Name: JEWELISSA SHOULDERS MRN: [id number] Date of Birth: 1961/05/21  Today's Date: 11/03/2016 SLP Individual Time: [id number] SLP Individual Time Calculation (min): 55 min   Short Term Goals: Week 1: SLP Short Term Goal 1 (Week 1): Pt will sustain her attention to basic familiar tasks for 2-3 minutes with min assist verbal cues for redirection.   SLP Short Term Goal 2 (Week 1): Pt will complete basic, familiar tasks with min assist verbal cues for functional problem solving. SLP Short Term Goal 3 (Week 1): Pt will attend to left upper extremity and the environment during basic tasks with min assist verbal cues.  SLP Short Term Goal 4 (Week 1): Pt will utilize external aids to facilitate recall of daily information with mod assist verbal cues.    Skilled Therapeutic Interventions:Skilled therapy intervention focused on cognitive goals. Given a novel visual scanning task, patient was able to scan left to find matching items given Mod A verbal and visual cues. Patient maintained attention to the task for two 10 minute intervals with a short break in between given supervision verbal cues. Patient required Mod A verbal cues for problem solving during safety awareness task. Patient appeared to have word finding difficulty when asked to explain problem solving rationale. Therapy schedule for the day reviewed with patient, who required Max A verbal and visual cues for immediate recall of schedule. Patient left upright in bed with bed alarm on and all needs within reach. Continue current plan of care.    Function:  Cognition Comprehension Comprehension assist level: Understands basic 75 - 89% of the time/ requires cueing 10 - 24% of the time  Expression   Expression assist level: Expresses basic 75 - 89% of the time/requires cueing 10 - 24% of the time. Needs helper to occlude trach/needs to repeat words.  Social Interaction Social Interaction  assist level: Interacts appropriately 50 - 74% of the time - May be physically or verbally inappropriate.  Problem Solving Problem solving assist level: Solves basic 50 - 74% of the time/requires cueing 25 - 49% of the time  Memory Memory assist level: Recognizes or recalls 25 - 49% of the time/requires cueing 50 - 75% of the time    Pain Pain Assessment Pain Assessment: No/denies pain  Therapy/Group: Individual Therapy  Thornton Papas 11/03/2016, 12:25 PM

## 2016-11-03 NOTE — Plan of Care (Signed)
Problem: RH PAIN MANAGEMENT Goal: RH STG PAIN MANAGED AT OR BELOW PT'S PAIN GOAL Less than 4 on 1-10 scale   Outcome: Progressing No c/o pain

## 2016-11-03 NOTE — Progress Notes (Signed)
[place] PHYSICAL MEDICINE & REHABILITATION     PROGRESS NOTE  Subjective/Complaints:  Pt laying in bed this AM.  She states she had a good weekend and slept well overnight.  She needs to "pee".  ROS: Denies CP, SOB, N/V/D.  Objective: Vital Signs: Blood pressure (!) 105/53, pulse (!) 56, temperature 98.6 F (37 C), temperature source Oral, resp. rate 18, weight 90.2 kg (198 lb 13.7 oz), SpO2 100 %. No results found. No results for input(s): WBC, HGB, HCT, PLT in the last 72 hours. No results for input(s): NA, K, CL, GLUCOSE, BUN, CREATININE, CALCIUM in the last 72 hours.  Invalid input(s): CO CBG (last 3)  No results for input(s): GLUCAP in the last 72 hours.  Wt Readings from Last 3 Encounters:  11/01/16 90.2 kg (198 lb 13.7 oz)  10/29/16 89.4 kg (197 lb)  10/20/16 89.5 kg (197 lb 4.8 oz)    Physical Exam:  BP (!) 105/53 (BP Location: Left Arm)   Pulse (!) 56   Temp 98.6 F (37 C) (Oral)   Resp 18   Wt 90.2 kg (198 lb 13.7 oz)   SpO2 100%   BMI 35.23 kg/m  Constitutional: She appears well-developed and well-nourished. NAD. HENT: Normocephalic. Well healed old left crani incision and right crani incision C/D/I with staples in place.   Eyes: Cloudy sclera. Right ptosis.  Cardiovascular: Regular rate. Regular rhythm. No JVD. Respiratory: Effort normal and breath sounds normal. No respiratory distress.  GI: Soft. Bowel sounds are normal. She exhibits no distension. There is no tenderness.  Musculoskeletal: No edema or tenderness in extremitites  Neurological: She is alert and oriented.  Left inattention.  Right ptosis and left facial weakness.  Does not have good awareness of deficits.  Motor: 4+/5 grossly throughout  Skin: Skin is warm and dry.  Psychiatric: Her affect is blunt. Her speech is delayed. She is slowed   Assessment/Plan: 1. 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 Physiatrist is providing close team supervision and 24 hour management of active medical problems listed below. Physiatrist and rehab team continue to assess barriers to discharge/monitor patient progress toward functional and medical goals.  Function:  Bathing Bathing position Bathing activity did not occur: Refused Position: Wheelchair/chair at sink  Bathing parts Body parts bathed by patient: Right arm, Left arm, Chest, Abdomen, Front perineal area, Buttocks, Right upper leg, Left upper leg, Right lower leg, Left lower leg Body parts bathed by helper: Back  Bathing assist Assist Level: Touching or steadying assistance(Pt > 75%)      Upper Body Dressing/Undressing Upper body dressing   What is the patient wearing?: Hospital gown                Upper body assist Assist Level: Touching or steadying assistance(Pt > 75%)   Set up : To obtain clothing/put away  Lower Body Dressing/Undressing Lower body dressing   What is the patient wearing?: [place], Non-skid slipper socks Underwear - Performed by patient: Thread/unthread right underwear leg, Thread/unthread left underwear leg, Pull underwear up/down       Non-skid slipper socks- Performed by patient: Don/doff right sock, Don/doff left sock Non-skid slipper socks- Performed by helper: Don/doff right sock, Don/doff left sock                  Lower  body assist Assist for lower body dressing: Touching or steadying assistance (Pt > 75%)      Toileting Toileting   Toileting steps completed by patient: Adjust clothing prior to toileting, Performs perineal hygiene, Adjust clothing after toileting   Toileting Assistive Devices: Grab bar or rail  Toileting assist Assist level: Touching or steadying assistance (Pt.75%)   Transfers Chair/bed transfer   Chair/bed transfer method: Stand  pivot Chair/bed transfer assist level: Touching or steadying assistance (Pt > 75%) Chair/bed transfer assistive device: Armrests     Locomotion Ambulation     Max distance: 150 Assist level: Touching or steadying assistance (Pt > 75%)   Wheelchair   Type: Manual Max wheelchair distance: 25 Assist Level: Touching or steadying assistance (Pt > 75%)  Cognition Comprehension Comprehension assist level: Understands basic 75 - 89% of the time/ requires cueing 10 - 24% of the time  Expression Expression assist level: Expresses basic 75 - 89% of the time/requires cueing 10 - 24% of the time. Needs helper to occlude trach/needs to repeat words.  Social Interaction Social Interaction assist level: Interacts appropriately 50 - 74% of the time - May be physically or verbally inappropriate.  Problem Solving Problem solving assist level: Solves basic 50 - 74% of the time/requires cueing 25 - 49% of the time  Memory Memory assist level: Recognizes or recalls 50 - 74% of the time/requires cueing 25 - 49% of the time    Medical Problem List and Plan: 1.  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   Cont CIR 2.  DVT Prophylaxis/Anticoagulation: Mechanical: Sequential compression devices, below knee Bilateral lower extremities 3. Pain Management: Will continue Hydrocodone prn for now 4. Mood: LCSW to follow for evaluation and support.  5. Neuropsych: This patient is not fully capable of making decisions on her own behalf. 6. Skin/Wound Care: Monitor incision for healing. Maintain adequate nutritional and hydration status.  7. Fluids/Electrolytes/Nutrition: Monitor I/O.  8. Seizure prophylaxis: On keppra.  9. H/o SVT/WPW s/p ablation: Monitor HR bid 10. Hypokalemia: ?Resolved  K+4.3 on 11/9  Labs pending 11. Leukocytosis: Afebrile  WBCs 14.5 on 11/10  Cont to  monitor  Labs pending  CXR 11/9 reviewed, unremarkable for acute process  UA negative, Ucx multiple species 12. AKI: Avoid nephrotoxic meds.   Cr 1.11 on 11/9  Encourage fluid intake  Labs pending 13. ABLA: ?Resolved  Hb 12.8 on 11/9  Labs pending 14. Bradycardia:   Asypmtomatic  Will cont to monitor   LOS (Days) 5 A FACE TO FACE EVALUATION WAS PERFORMED  [name] [name] Lorie Phenix 11/03/2016 9:04 AM

## 2016-11-03 NOTE — Progress Notes (Signed)
Occupational Therapy Session Note  Patient Details  Name: [name] [name] MRN: [id number] Date of Birth: Nov 03, 1961  Today's Date: 11/03/2016 OT Individual Time: 1100-1203 OT Individual Time Calculation (min): 63 min     Short Term Goals: Week 1:  OT Short Term Goal 1 (Week 1): Pt will perform toilet transfer with min A in order to increase I with functional transfers.  OT Short Term Goal 2 (Week 1): Pt will perform LB dressing with min Ain order to increase I with functional transfers.  OT Short Term Goal 3 (Week 1): Pt will locate items to the L during functional tasks with min cues. OT Short Term Goal 4 (Week 1): Pt will utilize L UE during bilateral coordination tasks with min verbal cues to attend to UE.  Skilled Therapeutic Interventions/Progress Updates:    Upon entering the room, pt supine in bed with ex husband present in room. He did not stay for therapy session. Pt agreeable to OT intervention this session. Skilled OT intervention with focus on initiation, sequencing, problem solving, self care retraining, and functional mobility. Pt performed supine >sit with min A to EOB. Pt ambulating without use of AD and required steady assistance hand held. Pt required mod verbal questioning cues in order to obtain all clothing needed for task. Pt seated in wheelchair at sink for bathing and dressing. Pt with decreased safety awareness as she attempts to stand from unlocked wheelchair multiple times. Pt engaged in bathing with sit <>stand at sink and min A for standing balance. Pt utilized B UE's for task and needing increased time and mod multimodal cues to initiate and complete task. Pt needing mod cues to attend to task as well. Pt verbalizing need for BM and ambulated to bathroom with min A. She performed clothing management and hygiene with min A for balance. Pt returned to wheelchair with quick release belt donned and call bell within reach upon exiting the room.   Therapy  Documentation Precautions:  Precautions Precautions: Fall Precaution Comments: L inattention Restrictions Weight Bearing Restrictions: No General:   Vital Signs:  Pain: Pain Assessment Pain Assessment: No/denies pain See Function Navigator for Current Functional Status.   Therapy/Group: Individual Therapy  Gypsy Decant 11/03/2016, 12:34 PM

## 2016-11-04 ENCOUNTER — Inpatient Hospital Stay (HOSPITAL_COMMUNITY): Payer: Medicaid Other | Admitting: Occupational Therapy

## 2016-11-04 ENCOUNTER — Inpatient Hospital Stay (HOSPITAL_COMMUNITY): Payer: Medicaid Other | Admitting: Speech Pathology

## 2016-11-04 ENCOUNTER — Inpatient Hospital Stay (HOSPITAL_COMMUNITY): Payer: Medicaid Other

## 2016-11-04 DIAGNOSIS — I69319 Unspecified symptoms and signs involving cognitive functions following cerebral infarction: Secondary | ICD-10-CM

## 2016-11-04 NOTE — Progress Notes (Signed)
Speech Language Pathology Daily Session Note  Patient Details  Name: [name] [name] MRN: [id number] Date of Birth: 02/23/1961  Today's Date: 11/04/2016 SLP Individual Time: 1400-1500 SLP Individual Time Calculation (min): 60 min   Short Term Goals: Week 1: SLP Short Term Goal 1 (Week 1): Pt will sustain her attention to basic familiar tasks for 2-3 minutes with min assist verbal cues for redirection.   SLP Short Term Goal 2 (Week 1): Pt will complete basic, familiar tasks with min assist verbal cues for functional problem solving. SLP Short Term Goal 3 (Week 1): Pt will attend to left upper extremity and the environment during basic tasks with min assist verbal cues.  SLP Short Term Goal 4 (Week 1): Pt will utilize external aids to facilitate recall of daily information with mod assist verbal cues.    Skilled Therapeutic Interventions:   Skilled treatment session focused on addressing cognition goals. SLP facilitated session by providing a structured card task that focused on addressing attention to and forced use of left upper extremity, which patient did with Max assist verbal cues; SLP unable to fade cues to Mod assist level during session despite repetitiveness of task.  Patient also required Total assist to locate her room at end of session due to being on the left despite being given the room number and told that it will be on the left.  At end of session patient was able to return demonstration of use of call bell for help but required Mod assist verbal and visual cues to problem solve use of TV buttons.  Patient left in wheelchair with quick release belt; care plan modified to reflect quick release belt and RN notified.  Continue with current plan of care.    Function:  Eating Eating                 Cognition Comprehension Comprehension assist level: Understands basic 50 - 74% of the time/ requires cueing 25 - 49% of the time  Expression   Expression assist level: Expresses  basic 50 - 74% of the time/requires cueing 25 - 49% of the time. Needs to repeat parts of sentences.  Social Interaction Social Interaction assist level: Interacts appropriately 25 - 49% of time - Needs frequent redirection.  Problem Solving Problem solving assist level: Solves basic 25 - 49% of the time - needs direction more than half the time to initiate, plan or complete simple activities  Memory Memory assist level: Recognizes or recalls less than 25% of the time/requires cueing greater than 75% of the time    Pain Pain Assessment Pain Assessment: No/denies pain  Therapy/Group: Individual Therapy  Carmelia Roller., CCC-SLP [id number]  [place] 11/04/2016, 4:27 PM

## 2016-11-04 NOTE — Progress Notes (Signed)
Physical Therapy Session Note  Patient Details  Name: [name] [name] MRN: [id number] Date of Birth: [birth year]  Today's Date: 11/04/2016 PT Individual Time: 0900-1000 PT Individual Time Calculation (min): 60 min    Short Term Goals: Week 1:  PT Short Term Goal 1 (Week 1): Pt will be able to demonstrate increased endurance with gait >= 100' with LRAD and steady assist PT Short Term Goal 2 (Week 1): Pt will improve score on Berg Balance Test by 5 points to demonstrate decreased fall risk PT Short Term Goal 3 (Week 1): Pt will demonstrate increased attention to L during functional tasks with min verbal cues needed  Skilled Therapeutic Interventions/Progress Updates:    Session focused functional mobility and balance re-training with cognitive remediation. Focused on education about awareness of deficits, need for supervision at home, and identifying deficits with poor insight. Mobility tasks biased to address attention, problem solving, awareness, and memory. Repeated dynamic balance training on compliant surface while playing connect 4 - pt able to recall that we did this yesterday and the rules of the game but required mod verbal cues for turn taking, attention to the L, using the correct color, and difficulty identify strategies for the game. Completed overall mobility with close supervision to min assist including stairs, gait, transfers, dynamic gait (sidestepping, turning around cones, and stepping over cones), and compliant surface balance re-training. Repeated Nustep activity with LUE covering up yellow piece of tape to address attention during task (mod cues) and goal for 10 min (on level 5) - mod cues to complete task and redirect focus to watch timer and L hand placement.   Therapy Documentation Precautions:  Precautions Precautions: Fall Precaution Comments: L inattention Restrictions Weight Bearing Restrictions: No   Pain:  Denies pain.   See Function Navigator for Current  Functional Status.   Therapy/Group: Individual Therapy  Canary Brim Ivory Broad, PT, DPT  11/04/2016, 11:43 AM

## 2016-11-04 NOTE — Progress Notes (Signed)
Occupational Therapy Session Note  Patient Details  Name: [name] [name] MRN: [id number] Date of Birth: February 19, 1961  Today's Date: 11/04/2016 OT Individual Time: [id number] and 1115-1200 OT Individual Time Calculation (min): 42 min and 45 min     Short Term Goals: Week 1:  OT Short Term Goal 1 (Week 1): Pt will perform toilet transfer with min A in order to increase I with functional transfers.  OT Short Term Goal 2 (Week 1): Pt will perform LB dressing with min Ain order to increase I with functional transfers.  OT Short Term Goal 3 (Week 1): Pt will locate items to the L during functional tasks with min cues. OT Short Term Goal 4 (Week 1): Pt will utilize L UE during bilateral coordination tasks with min verbal cues to attend to UE.  Skilled Therapeutic Interventions/Progress Updates:     Session 1: Upon entering the room, pt supine in bed needing minimal cues for participation. Pt ambulating without use of AD to bathroom for toileting with steady assistance for clothing management and hygiene. Pt ambulating in same manner to dresser in order to obtain clothing items. Pt given max cues to attend to L in order to find self care items. Pt seated on EOB, to don clothing items with mod multimodal cues to attend to task. Pt donned LB bathing with steady assistance for balance. Pt returned to bed at end of session with call bell and all needed items within reach.   Session 2: Upon entering the room, pt supine in bed with no c/o pain. Pt agreeable to OT intervention. Pt ambulated without assistive device 200' to family room with steady assistance. Pt engaged in cognition task of utilizing computer to locate information (familiar task). Pt unable to navigate computer without maximal assistance. Pt was able to locate a news video and watched 4.5 minutes of it in room with minimal distractions. Pt did not need cues to attend to task but in this time frame she stopped watching video 5 times but returned to  screen ~10-15 seconds. Pt returned to room at end of session with call bell and all needed items within reach. Bed alarm activated.  Therapy Documentation Precautions:  Precautions Precautions: Fall Precaution Comments: L inattention Restrictions Weight Bearing Restrictions: No General:   Vital Signs: Therapy Vitals Temp: 98 F (36.7 C) Temp Source: Oral Pulse Rate: 60 Resp: 16 BP: (!) 105/46 Patient Position (if appropriate): Lying Oxygen Therapy SpO2: 100 % O2 Device: Not Delivered  See Function Navigator for Current Functional Status.   Therapy/Group: Individual Therapy  Gypsy Decant 11/04/2016, 9:01 AM

## 2016-11-04 NOTE — Progress Notes (Signed)
[place] PHYSICAL MEDICINE & REHABILITATION     PROGRESS NOTE  Subjective/Complaints:  Pt sitting up in bed eating breakfast this AM.  She states she slept well, "I reckon".  ROS: Denies CP, SOB, N/V/D.  Objective: Vital Signs: Blood pressure (!) 105/46, pulse 60, temperature 98 F (36.7 C), temperature source Oral, resp. rate 16, weight 90.2 kg (198 lb 13.7 oz), SpO2 100 %. No results found.  Recent Labs  11/03/16 1022  WBC 12.4*  HGB 11.7*  HCT 37.4  PLT 352    Recent Labs  11/03/16 1022  NA 140  K 4.1  CL 110  GLUCOSE 105*  BUN 17  CREATININE 1.09*  CALCIUM 9.1   CBG (last 3)  No results for input(s): GLUCAP in the last 72 hours.  Wt Readings from Last 3 Encounters:  11/01/16 90.2 kg (198 lb 13.7 oz)  10/29/16 89.4 kg (197 lb)  10/20/16 89.5 kg (197 lb 4.8 oz)    Physical Exam:  BP (!) 105/46 (BP Location: Right Arm)   Pulse 60   Temp 98 F (36.7 C) (Oral)   Resp 16   Wt 90.2 kg (198 lb 13.7 oz)   SpO2 100%   BMI 35.23 kg/m  Constitutional: She appears well-developed and well-nourished. NAD. HENT: Normocephalic. Well healed old left crani incision and right crani incision C/D/I with staples in place.   Eyes: Cloudy sclera. Right ptosis.  Cardiovascular: Regular rate. Regular rhythm. No JVD. Respiratory: Effort normal and breath sounds normal. No respiratory distress.  GI: Soft. Bowel sounds are normal. She exhibits no distension. There is no tenderness.  Musculoskeletal: No edema or tenderness in extremitites  Neurological: She is alert and oriented.  Left inattention.  Right ptosis and left facial weakness.  Does not have good awareness of deficits.  Motor: 4+/5 grossly throughout(unchanged) Skin: Skin is warm and dry.  Psychiatric: Her affect is blunt. Her speech is delayed. She is slowed   Assessment/Plan: 1. 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 Physiatrist is providing close team supervision and 24 hour management of active medical problems listed below. Physiatrist and rehab team continue to assess barriers to discharge/monitor patient progress toward functional and medical goals.  Function:  Bathing Bathing position Bathing activity did not occur: Refused Position: Wheelchair/chair at sink  Bathing parts Body parts bathed by patient: Right arm, Left arm, Chest, Abdomen, Front perineal area, Buttocks, Right upper leg, Left upper leg, Right lower leg, Left lower leg Body parts bathed by helper: Back  Bathing assist Assist Level: Touching or steadying assistance(Pt > 75%)      Upper Body Dressing/Undressing Upper body dressing   What is the patient wearing?: Pull over shirt/dress     Pull over shirt/dress - Perfomed by patient: Thread/unthread right sleeve, Thread/unthread left sleeve, Put head through opening, Pull shirt over trunk          Upper body assist Assist Level: Supervision or verbal cues, Set up   Set up : To obtain clothing/put away  Lower Body Dressing/Undressing Lower body dressing   What is the patient wearing?: Pants, Underwear Underwear - Performed by patient: Thread/unthread right underwear leg, Thread/unthread left underwear leg, Pull underwear up/down   Pants- Performed by patient: Thread/unthread right pants leg, Thread/unthread left pants leg, Pull pants up/down   Non-skid slipper socks- Performed by patient:  Don/doff right sock, Don/doff left sock Non-skid slipper socks- Performed by helper: Don/doff right sock, Don/doff left sock                  Lower body assist Assist for lower body dressing: Touching or steadying assistance (Pt > 75%)      Toileting Toileting   Toileting steps completed by patient: Adjust clothing prior to toileting, Performs perineal hygiene, Adjust clothing  after toileting Toileting steps completed by helper: Adjust clothing prior to toileting, Performs perineal hygiene, Adjust clothing after toileting (per Blen Enuol, NT) Toileting Assistive Devices: Grab bar or rail  Toileting assist Assist level: Touching or steadying assistance (Pt.75%)   Transfers Chair/bed transfer   Chair/bed transfer method: Stand pivot, Ambulatory Chair/bed transfer assist level: Touching or steadying assistance (Pt > 75%) Chair/bed transfer assistive device: Armrests     Locomotion Ambulation     Max distance: 150 Assist level: Touching or steadying assistance (Pt > 75%)   Wheelchair   Type: Manual Max wheelchair distance: 25 Assist Level: Touching or steadying assistance (Pt > 75%)  Cognition Comprehension Comprehension assist level: Understands basic 75 - 89% of the time/ requires cueing 10 - 24% of the time  Expression Expression assist level: Expresses basic 75 - 89% of the time/requires cueing 10 - 24% of the time. Needs helper to occlude trach/needs to repeat words.  Social Interaction Social Interaction assist level: Interacts appropriately 75 - 89% of the time - Needs redirection for appropriate language or to initiate interaction.  Problem Solving Problem solving assist level: Solves basic 50 - 74% of the time/requires cueing 25 - 49% of the time  Memory Memory assist level: Recognizes or recalls 50 - 74% of the time/requires cueing 25 - 49% of the time    Medical Problem List and Plan: 1.  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   Cont CIR 2.  DVT Prophylaxis/Anticoagulation: Mechanical: Sequential compression devices, below knee Bilateral lower extremities 3. Pain Management: Will continue Hydrocodone prn for now 4. Mood: LCSW to follow for evaluation and support.  5. Neuropsych: This patient is not fully capable of  making decisions on her own behalf. 6. Skin/Wound Care: Monitor incision for healing. Maintain adequate nutritional and hydration status.  7. Fluids/Electrolytes/Nutrition: Monitor I/O.  8. Seizure prophylaxis: On keppra.  9. H/o SVT/WPW s/p ablation: Monitor HR bid 10. Hypokalemia: Resolved  K+4.1 on 11/13 11. Leukocytosis: Afebrile  WBCs 12.4 on 11/13 (trending down)  Cont to monitor  CXR 11/9 reviewed, unremarkable for acute process  UA negative, Ucx multiple species 12. AKI: Avoid nephrotoxic meds.   Cr 1.09 on 11/13  Encourage fluid intake 13. ABLA:   Hb 11.7 on 11/13 14. Bradycardia: Stable at present  Asypmtomatic  Will cont to monitor   LOS (Days) 6 A FACE TO FACE EVALUATION WAS PERFORMED  Ankit Lorie Phenix 11/04/2016 8:29 AM

## 2016-11-04 NOTE — Plan of Care (Signed)
PT LTG's for cognition downgraded to overall min assist due to lack of progress. See Care Plan for details.  Lars Masson, PT, DPT

## 2016-11-05 ENCOUNTER — Inpatient Hospital Stay (HOSPITAL_COMMUNITY): Payer: Medicaid Other | Admitting: Physical Therapy

## 2016-11-05 ENCOUNTER — Inpatient Hospital Stay (HOSPITAL_COMMUNITY): Payer: Medicaid Other | Admitting: *Deleted

## 2016-11-05 ENCOUNTER — Inpatient Hospital Stay (HOSPITAL_COMMUNITY): Payer: Medicaid Other | Admitting: Speech Pathology

## 2016-11-05 ENCOUNTER — Inpatient Hospital Stay (HOSPITAL_COMMUNITY): Payer: Medicaid Other | Admitting: Occupational Therapy

## 2016-11-05 DIAGNOSIS — I950 Idiopathic hypotension: Secondary | ICD-10-CM

## 2016-11-05 NOTE — Progress Notes (Signed)
Speech Language Pathology Daily Session Note  Patient Details  Name: [name] [name] MRN: [id number] Date of Birth: Feb 05, 1961  Today's Date: 11/05/2016 SLP Individual Time: 1000-1055 SLP Individual Time Calculation (min): 55 min   Short Term Goals: Week 1: SLP Short Term Goal 1 (Week 1): Pt will sustain her attention to basic familiar tasks for 2-3 minutes with min assist verbal cues for redirection.   SLP Short Term Goal 2 (Week 1): Pt will complete basic, familiar tasks with min assist verbal cues for functional problem solving. SLP Short Term Goal 3 (Week 1): Pt will attend to left upper extremity and the environment during basic tasks with min assist verbal cues.  SLP Short Term Goal 4 (Week 1): Pt will utilize external aids to facilitate recall of daily information with mod assist verbal cues.    Skilled Therapeutic Interventions:Skilled therapy intervention focused on cognitive goals. During a basic card task, patient required Mod A verbal cues for problem solving and Min A verbal cues for recall of task parameters. Patient independently sustained attention to card task for two 10 minute intervals. Patient required Mod A verbal cues for use of her LUE, fading to Min A in the second repetition of the task. Patient was able to demonstrate use of the nurse calling and television-related functions of her call bell given supervision verbal cues. Given instruction on the use of the room telephone, patient was able to recall instructions 10 minutes later given a supervision question cue. When provided with a list of channels, patient independently problem solved for setting the television channel to her preference. Patient left upright in bed with bed alarm on and all needs within reach. Continue current plan of care.    Function:  Cognition Comprehension Comprehension assist level: Understands basic 75 - 89% of the time/ requires cueing 10 - 24% of the time  Expression   Expression assist  level: Expresses basic 50 - 74% of the time/requires cueing 25 - 49% of the time. Needs to repeat parts of sentences.  Social Interaction Social Interaction assist level: Interacts appropriately 50 - 74% of the time - May be physically or verbally inappropriate.  Problem Solving Problem solving assist level: Solves basic 50 - 74% of the time/requires cueing 25 - 49% of the time  Memory Memory assist level: Recognizes or recalls less than 25% of the time/requires cueing greater than 75% of the time    Pain Pain Assessment Pain Assessment: No/denies pain  Therapy/Group: Individual Therapy  Thornton Papas 11/05/2016, 3:46 PM

## 2016-11-05 NOTE — Progress Notes (Addendum)
[place] PHYSICAL MEDICINE & REHABILITATION     PROGRESS NOTE  Subjective/Complaints:  Pt laying in bed this AM working with OT.  She is perseverative on the fact that it is "hump day" and when asked about her night she states it was a "hump night" and that she ate a lot at night.   ROS: Denies CP, SOB, N/V/D.  Objective: Vital Signs: Blood pressure (!) 97/44, pulse (!) 51, temperature 98.4 F (36.9 C), temperature source Oral, resp. rate 16, weight 102 kg (224 lb 13.9 oz), SpO2 100 %. No results found.  Recent Labs  11/03/16 1022  WBC 12.4*  HGB 11.7*  HCT 37.4  PLT 352    Recent Labs  11/03/16 1022  NA 140  K 4.1  CL 110  GLUCOSE 105*  BUN 17  CREATININE 1.09*  CALCIUM 9.1   CBG (last 3)  No results for input(s): GLUCAP in the last 72 hours.  Wt Readings from Last 3 Encounters:  11/05/16 102 kg (224 lb 13.9 oz)  10/29/16 89.4 kg (197 lb)  10/20/16 89.5 kg (197 lb 4.8 oz)    Physical Exam:  BP (!) 97/44 (BP Location: Right Arm)   Pulse (!) 51   Temp 98.4 F (36.9 C) (Oral)   Resp 16   Wt 102 kg (224 lb 13.9 oz)   SpO2 100%   BMI 39.83 kg/m  Constitutional: She appears well-developed and well-nourished. NAD. HENT: Normocephalic. Well healed old left crani incision and right crani incision C/D/I with staples in place.   Eyes: Cloudy sclera. Right ptosis (unchanged).  Cardiovascular: Regular rate. Regular rhythm. No JVD. Respiratory: Effort normal and breath sounds normal. No respiratory distress.  GI: Soft. Bowel sounds are normal. She exhibits no distension. There is no tenderness.  Musculoskeletal: No edema or tenderness in extremitites  Neurological: She is alert and oriented.  Left inattention.  Right ptosis and left facial weakness.  Does not have good awareness of deficits.  Motor: 4+/5 grossly throughout (stable) Skin: Skin is warm and dry.  Psychiatric: Her affect is blunt. Her speech is delayed. She is slowed.  Perseverative.   Assessment/Plan: 1. 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 Physiatrist is providing close team supervision and 24 hour management of active medical problems listed below. Physiatrist and rehab team continue to assess barriers to discharge/monitor patient progress toward functional and medical goals.  Function:  Bathing Bathing position Bathing activity did not occur: Refused Position: Wheelchair/chair at sink  Bathing parts Body parts bathed by patient: Right arm, Left arm, Chest, Abdomen, Front perineal area, Buttocks, Right upper leg, Left upper leg, Right lower leg, Left lower leg Body parts bathed by helper: Back  Bathing assist Assist Level: Touching or steadying assistance(Pt > 75%)      Upper Body Dressing/Undressing Upper body dressing   What is the patient wearing?: Pull over shirt/dress     Pull over shirt/dress - Perfomed by patient: Thread/unthread right sleeve, Thread/unthread left sleeve, Put head through opening Pull over shirt/dress - Perfomed by helper: Pull shirt over trunk        Upper body assist Assist Level: Touching or steadying assistance(Pt > 75%)   Set up : To obtain clothing/put away  Lower Body Dressing/Undressing Lower body dressing   What is the patient wearing?: Pants, Underwear Underwear - Performed by patient:  Pull underwear up/down Underwear - Performed by helper: Pull underwear up/down Pants- Performed by patient: Pull pants up/down Pants- Performed by helper: Pull pants up/down Non-skid slipper socks- Performed by patient: Don/doff right sock, Don/doff left sock Non-skid slipper socks- Performed by helper: Don/doff right sock, Don/doff left sock                  Lower body assist Assist for lower body dressing: Touching or  steadying assistance (Pt > 75%)      Toileting Toileting   Toileting steps completed by patient: Adjust clothing prior to toileting, Adjust clothing after toileting Toileting steps completed by helper: Adjust clothing prior to toileting, Performs perineal hygiene, Adjust clothing after toileting (per Blen Enuol, NT) Toileting Assistive Devices: Grab bar or rail  Toileting assist Assist level: Touching or steadying assistance (Pt.75%)   Transfers Chair/bed transfer   Chair/bed transfer method: Ambulatory Chair/bed transfer assist level: Touching or steadying assistance (Pt > 75%) Chair/bed transfer assistive device: Armrests     Locomotion Ambulation     Max distance: 150 Assist level: Touching or steadying assistance (Pt > 75%)   Wheelchair   Type: Manual Max wheelchair distance: 25 Assist Level: Touching or steadying assistance (Pt > 75%)  Cognition Comprehension Comprehension assist level: Understands basic 50 - 74% of the time/ requires cueing 25 - 49% of the time  Expression Expression assist level: Expresses basic 50 - 74% of the time/requires cueing 25 - 49% of the time. Needs to repeat parts of sentences.  Social Interaction Social Interaction assist level: Interacts appropriately 25 - 49% of time - Needs frequent redirection.  Problem Solving Problem solving assist level: Solves basic 25 - 49% of the time - needs direction more than half the time to initiate, plan or complete simple activities  Memory Memory assist level: Recognizes or recalls less than 25% of the time/requires cueing greater than 75% of the time    Medical Problem List and Plan: 1.  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   Cont CIR 2.  DVT Prophylaxis/Anticoagulation: Mechanical: Sequential compression devices, below knee Bilateral lower extremities 3. Pain Management:  Will continue Hydrocodone prn for now 4. Mood: LCSW to follow for evaluation and support.  5. Neuropsych: This patient is not fully capable of making decisions on her own behalf. 6. Skin/Wound Care: Monitor incision for healing. Maintain adequate nutritional and hydration status.  7. Fluids/Electrolytes/Nutrition: Monitor I/O.  8. Seizure prophylaxis: On keppra.  9. H/o SVT/WPW s/p ablation: Monitor HR bid 10. Hypokalemia: Resolved  K+4.1 on 11/13 11. Leukocytosis: Afebrile  WBCs 12.4 on 11/13 (trending down)  Cont to monitor  CXR 11/9 reviewed, unremarkable for acute process  UA negative, Ucx multiple species 12. AKI: Avoid nephrotoxic meds.   Cr 1.09 on 11/13  Encourage fluid intake 13. ABLA:   Hb 11.7 on 11/13 14. Hypotension/Bradycardia:   Asypmtomatic on 11/16  Will cont to monitor   LOS (Days) 7 A FACE TO FACE EVALUATION WAS PERFORMED  Laurence Folz Lorie Phenix 11/05/2016 8:21 AM

## 2016-11-05 NOTE — Progress Notes (Signed)
Recreational Therapy Session Note  Patient Details  Name: [name] [name] MRN: [id number] Date of Birth: April 09, 1961 Today's Date: 11/05/2016   Met with pt today to discuss TR services and pt with little interest in TR services. Will continue to monitor through team for future participation.   Daria Mcmeekin 11/05/2016, 4:25 PM

## 2016-11-05 NOTE — Progress Notes (Signed)
Occupational Therapy Session Note  Patient Details  Name: [name] [name] MRN: [id number] Date of Birth: [birth year]  Today's Date: 11/05/2016 OT Individual Time: [id number] OT Individual Time Calculation (min): 75 min     Short Term Goals: Week 1:  OT Short Term Goal 1 (Week 1): Pt will perform toilet transfer with min A in order to increase I with functional transfers.  OT Short Term Goal 2 (Week 1): Pt will perform LB dressing with min Ain order to increase I with functional transfers.  OT Short Term Goal 3 (Week 1): Pt will locate items to the L during functional tasks with min cues. OT Short Term Goal 4 (Week 1): Pt will utilize L UE during bilateral coordination tasks with min verbal cues to attend to UE.  Skilled Therapeutic Interventions/Progress Updates:    Upon entering the room, pt supine in bed and declining therapy secondary to early time. Pt closing eyes while therapist talking and attempting to return to sleep. Pt needing maximal encouragement for participation this session. Pt declined bathing and dressing this session. Pt ambulated to bathroom without use of AD and mod mutlimodal cues for safety and technique. Pt performed toilet transfer, clothing management, and hygiene with supervision as well. Food and food containers all over bed as pt has been snacking all night and is unaware of items in bed on L side. Pt ambulating to throw items away, gather sheet, and make bed with clean linens. Pt requiring maximal cues for task for safety. Once pt finshing, pt sitting on EOB and returning to supine stating , "i'm done. That's all." Pt declined further intervention at this time. Bed alarm activated and call bell within reach.   Therapy Documentation Precautions:  Precautions Precautions: Fall Precaution Comments: L inattention Restrictions Weight Bearing Restrictions: No General: General OT Amount of Missed Time: 15 Minutes Vital Signs:  Pain: Pain Assessment Pain  Assessment: No/denies pain  See Function Navigator for Current Functional Status.   Therapy/Group: Individual Therapy  Gypsy Decant 11/05/2016, 5:21 PM

## 2016-11-05 NOTE — Progress Notes (Signed)
Physical Therapy Session Note  Patient Details  Name: [name] [name] MRN: [id number] Date of Birth: 1961/12/17  Today's Date: 11/05/2016 PT Individual Time: 1305-1400 PT Individual Time Calculation (min): 55 min    Short Term Goals: Week 1:  PT Short Term Goal 1 (Week 1): Pt will be able to demonstrate increased endurance with gait >= 100' with LRAD and steady assist PT Short Term Goal 2 (Week 1): Pt will improve score on Berg Balance Test by 5 points to demonstrate decreased fall risk PT Short Term Goal 3 (Week 1): Pt will demonstrate increased attention to L during functional tasks with min verbal cues needed  Skilled Therapeutic Interventions/Progress Updates:    Session focused on increasing L attention, intellectual awareness of deficits, sustained attention, functional recall, L NMR, problem solving, safety, and activity tolerance. Patient in bed upon arrival with ex husband present initially then departing and patient agreeable to toileting with questioning. Patient sat EOB with cues to attend to L side and ambulated to and from bathroom for toileting with supervision. Gait training throughout rehab unit without device and close supervision with max multimodal cues to attend to L side of environment. Performed NuStep using LUE at level 3 for 10 min total with patient frequently stopping and asking to be finished with max-total cues to attend to task and keep LUE on target on arm grip and total A to monitor time goal of 10 min. Performed Dynavision mode A for LUE neuro re-ed and visual scanning to L x 3 trials of 22 hits, 25 hits, and 26 hits respectively and min verbal cues to scan to L. Performed with 30% green lights and patient with 1 error and 1 missed red light but followed instructions with supervision. Stair training up/down 12 (6") stairs using 1 rail with close supervision. In standing, instructed patient in basic 3D pipetree puzzle for bimanual task with max-total multimodal cues for  problem solving, awareness, and attention. Patient left in recliner with quick release belt on and needs in reach.   Therapy Documentation Precautions:  Precautions Precautions: Fall Precaution Comments: L inattention Restrictions Weight Bearing Restrictions: No Pain: Pain Assessment Pain Assessment: No/denies pain  See Function Navigator for Current Functional Status.   Therapy/Group: Individual Therapy  Laretta Alstrom 11/05/2016, 4:33 PM

## 2016-11-06 ENCOUNTER — Inpatient Hospital Stay (HOSPITAL_COMMUNITY): Payer: Medicaid Other | Admitting: Speech Pathology

## 2016-11-06 ENCOUNTER — Inpatient Hospital Stay (HOSPITAL_COMMUNITY): Payer: Medicaid Other

## 2016-11-06 ENCOUNTER — Inpatient Hospital Stay (HOSPITAL_COMMUNITY): Payer: Medicaid Other | Admitting: Occupational Therapy

## 2016-11-06 ENCOUNTER — Inpatient Hospital Stay (HOSPITAL_COMMUNITY): Payer: Medicaid Other | Admitting: Physical Therapy

## 2016-11-06 DIAGNOSIS — I63419 Cerebral infarction due to embolism of unspecified middle cerebral artery: Secondary | ICD-10-CM

## 2016-11-06 MED ORDER — DEXAMETHASONE 2 MG PO TABS
2.0000 mg | ORAL_TABLET | Freq: Three times a day (TID) | ORAL | Status: DC
  Administered 2016-11-06 – 2016-11-08 (×5): 2 mg via ORAL
  Filled 2016-11-06 (×5): qty 1

## 2016-11-06 NOTE — Progress Notes (Signed)
Physical Therapy Session Note  Patient Details  Name: [name] [name] MRN: [id number] Date of Birth: 1961/11/14  Today's Date: 11/06/2016 PT Individual Time: 1338-1410 PT Individual Time Calculation (min): 32 min    Short Term Goals: Week 1:  PT Short Term Goal 1 (Week 1): Pt will be able to demonstrate increased endurance with gait >= 100' with LRAD and steady assist PT Short Term Goal 2 (Week 1): Pt will improve score on Berg Balance Test by 5 points to demonstrate decreased fall risk PT Short Term Goal 3 (Week 1): Pt will demonstrate increased attention to L during functional tasks with min verbal cues needed  Skilled Therapeutic Interventions/Progress Updates:    Pt ex-husband in room coordinating picking up pt's mother tomorrow for family education. He did not attend therapy session - states he will not be home with her. Focused on functional transfers, gait on unit with supervision for verbal cues for attention to the L, re-administered Berg Balance Test (demonstrating significant improvement since initial evaluation), and Nustep for reciprocal movement pattern retraining and strengthening and endurance as well as cognitive remediation with cues for L hand on colored tape and attention to 10 min goal (mod cues) on level 5.   Therapy Documentation Precautions:  Precautions Precautions: Fall Precaution Comments: L inattention Restrictions Weight Bearing Restrictions: No Pain: Pain Assessment Pain Assessment: No/denies pain     Balance: Berg Balance Test Sit to Stand: Able to stand  independently using hands Standing Unsupported: Able to stand safely 2 minutes Sitting with Back Unsupported but Feet Supported on Floor or Stool: Able to sit safely and securely 2 minutes Stand to Sit: Sits safely with minimal use of hands Transfers: Able to transfer safely, definite need of hands Standing Unsupported with Eyes Closed: Able to stand 10 seconds safely Standing Ubsupported with Feet  Together: Able to place feet together independently and stand for 1 minute with supervision From Standing, Reach Forward with Outstretched Arm: Can reach forward >12 cm safely (5") From Standing Position, Pick up Object from Floor: Able to pick up shoe, needs supervision From Standing Position, Turn to Look Behind Over each Shoulder: Looks behind one side only/other side shows less weight shift Turn 360 Degrees: Able to turn 360 degrees safely but slowly Standing Unsupported, Alternately Place Feet on Step/Stool: Able to complete >2 steps/needs minimal assist Standing Unsupported, One Foot in Front: Needs help to step but can hold 15 seconds Standing on One Leg: Unable to try or needs assist to prevent fall Total Score: 38   See Function Navigator for Current Functional Status.   Therapy/Group: Individual Therapy  Canary Brim Ivory Broad, PT, DPT  11/06/2016, 2:12 PM

## 2016-11-06 NOTE — Progress Notes (Signed)
Physical Therapy Session Note  Patient Details  Name: ITATY STRENGTH MRN: [id number] Date of Birth: 07-10-1961  Today's Date: 11/06/2016 PT Individual Time: 1430-1500 PT Individual Time Calculation (min): 30 min    Short Term Goals: Week 1:  PT Short Term Goal 1 (Week 1): Pt will be able to demonstrate increased endurance with gait >= 100' with LRAD and steady assist PT Short Term Goal 2 (Week 1): Pt will improve score on Berg Balance Test by 5 points to demonstrate decreased fall risk PT Short Term Goal 3 (Week 1): Pt will demonstrate increased attention to L during functional tasks with min verbal cues needed  Skilled Therapeutic Interventions/Progress Updates:     Patient received sitting in recliner and agreeable to PT. PT session focused on path finding and problem solving to locate familiar destinations through rehab unit. Patient required moderate-max cues for increased use of signs in hall way to identify rooms and interpret information on signs as well as increased attention to the L side to avoid obstacles. Patient also instructed patient in stair training with BUE x 12 steps with supervision assist from PT. PT provided min cues for safety on stairs. Patient returned to room and left sitting in recliner with call bell in reach and lap belt in place.     Therapy Documentation Precautions:  Precautions Precautions: Fall Precaution Comments: L inattention Restrictions Weight Bearing Restrictions: No   Pain: Pain Assessment Pain Assessment: No/denies pain   See Function Navigator for Current Functional Status.   Therapy/Group: Individual Therapy  Lorie Phenix 11/06/2016, 2:39 PM

## 2016-11-06 NOTE — Progress Notes (Signed)
Occupational Therapy Session Note  Patient Details  Name: [name] [name] MRN: [id number] Date of Birth: May 10, 1961  Today's Date: 11/06/2016 OT Individual Time: 0700-0800 OT Individual Time Calculation (min): 60 min     Short Term Goals: Week 1:  OT Short Term Goal 1 (Week 1): Pt will perform toilet transfer with min A in order to increase I with functional transfers.  OT Short Term Goal 2 (Week 1): Pt will perform LB dressing with min Ain order to increase I with functional transfers.  OT Short Term Goal 3 (Week 1): Pt will locate items to the L during functional tasks with min cues. OT Short Term Goal 4 (Week 1): Pt will utilize L UE during bilateral coordination tasks with min verbal cues to attend to UE.  Skilled Therapeutic Interventions/Progress Updates:    Upon entering the room, pt supine in bed sleeping and agreeable to OT intervention with cues for encouragement. Pt ambulated without AD and close supervision with cues for safety into bathroom to wash. Pt bathing from shower level with sit <>stand from TTB with supervision as needed. Pt returning to it on EOB to don hospital gown as she did not have further hospital owns. Pt needing overall mod multimodal cues for safety. Pt sitting in wheelchair to eat breakfast with supervision from therapist and min verbal cues to attend to L side of tray for food items. Pt returning to bed at end of session. Bed alarm activated and call bell within reach upon exiting the room.   Therapy Documentation Precautions:  Precautions Precautions: Fall Precaution Comments: L inattention Restrictions Weight Bearing Restrictions: No  See Function Navigator for Current Functional Status.   Therapy/Group: Individual Therapy  Gypsy Decant 11/06/2016, 8:36 PM

## 2016-11-06 NOTE — Progress Notes (Signed)
[place] PHYSICAL MEDICINE & REHABILITATION     PROGRESS NOTE  Subjective/Complaints:  Sitting in chair trying to dial a phone number----denies any pain. Says she's going home tomorrow  ROS limited by cognition  Objective: Vital Signs: Blood pressure (!) 129/58, pulse (!) 59, temperature 98.3 F (36.8 C), temperature source Oral, resp. rate 16, weight 102 kg (224 lb 13.9 oz), SpO2 100 %. No results found.  Recent Labs  11/03/16 1022  WBC 12.4*  HGB 11.7*  HCT 37.4  PLT 352    Recent Labs  11/03/16 1022  NA 140  K 4.1  CL 110  GLUCOSE 105*  BUN 17  CREATININE 1.09*  CALCIUM 9.1   CBG (last 3)  No results for input(s): GLUCAP in the last 72 hours.  Wt Readings from Last 3 Encounters:  11/05/16 102 kg (224 lb 13.9 oz)  10/29/16 89.4 kg (197 lb)  10/20/16 89.5 kg (197 lb 4.8 oz)    Physical Exam:  BP (!) 129/58 (BP Location: Right Arm)   Pulse (!) 59   Temp 98.3 F (36.8 C) (Oral)   Resp 16   Wt 102 kg (224 lb 13.9 oz)   SpO2 100%   BMI 39.83 kg/m  Constitutional: She appears well-developed and well-nourished. NAD. HENT: staples out..   Eyes: Cloudy sclera. Right ptosis--stable.  Cardiovascular: RRR No JVD. Respiratory: CTA B. No respiratory distress.  GI: soft NT.  Musculoskeletal: No edema or tenderness in extremitites  Neurological: She is alert and oriented.  Left inattention severe.  Right ptosis and left facial weakness.  lmiited awareness of deficits.  Motor: 4+/5 grossly throughout (stable) Skin: Skin is warm and dry. No drainage Psychiatric: Her affect is blunt. Her speech is delayed. She is slowed. Perseverative at times..   Assessment/Plan: 1. 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 Physiatrist is providing close team supervision and 24 hour  management of active medical problems listed below. Physiatrist and rehab team continue to assess barriers to discharge/monitor patient progress toward functional and medical goals.  Function:  Bathing Bathing position Bathing activity did not occur: Refused Position: Wheelchair/chair at sink  Bathing parts Body parts bathed by patient: Right arm, Left arm, Chest, Abdomen, Front perineal area, Buttocks, Right upper leg, Left upper leg, Right lower leg, Left lower leg Body parts bathed by helper: Back  Bathing assist Assist Level: Touching or steadying assistance(Pt > 75%)      Upper Body Dressing/Undressing Upper body dressing   What is the patient wearing?: Pull over shirt/dress     Pull over shirt/dress - Perfomed by patient: Thread/unthread right sleeve, Thread/unthread left sleeve, Put head through opening Pull over shirt/dress - Perfomed by helper: Pull shirt over trunk        Upper body assist Assist Level: Touching or steadying assistance(Pt > 75%)   Set up : To obtain clothing/put away  Lower Body Dressing/Undressing Lower body dressing   What is the patient wearing?: Pants, Underwear Underwear - Performed by patient: Pull underwear up/down Underwear - Performed by helper: Pull underwear up/down Pants- Performed by patient: Pull pants up/down Pants- Performed by helper: Pull pants up/down Non-skid slipper socks- Performed by patient: Don/doff right sock, Don/doff left sock Non-skid slipper socks- Performed by helper: Don/doff right sock, Don/doff left sock  Lower body assist Assist for lower body dressing: Touching or steadying assistance (Pt > 75%)      Toileting Toileting   Toileting steps completed by patient: Adjust clothing prior to toileting, Adjust clothing after toileting, Performs perineal hygiene Toileting steps completed by helper: Adjust clothing prior to toileting, Performs perineal hygiene, Adjust clothing after toileting (per  Blen Enuol, NT) Toileting Assistive Devices: Grab bar or rail  Toileting assist Assist level: Supervision or verbal cues   Transfers Chair/bed transfer   Chair/bed transfer method: Ambulatory Chair/bed transfer assist level: Supervision or verbal cues Chair/bed transfer assistive device: Armrests     Locomotion Ambulation     Max distance: 150 Assist level: Supervision or verbal cues   Wheelchair   Type: Manual Max wheelchair distance: 25 Assist Level: Touching or steadying assistance (Pt > 75%)  Cognition Comprehension Comprehension assist level: Understands basic 75 - 89% of the time/ requires cueing 10 - 24% of the time  Expression Expression assist level: Expresses basic 50 - 74% of the time/requires cueing 25 - 49% of the time. Needs to repeat parts of sentences.  Social Interaction Social Interaction assist level: Interacts appropriately 50 - 74% of the time - May be physically or verbally inappropriate.  Problem Solving Problem solving assist level: Solves basic 50 - 74% of the time/requires cueing 25 - 49% of the time  Memory Memory assist level: Recognizes or recalls less than 25% of the time/requires cueing greater than 75% of the time    Medical Problem List and Plan: 1.  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   Cont CIR  -pt with limited insight and awareness 2.  DVT Prophylaxis/Anticoagulation: Mechanical: Sequential compression devices, below knee Bilateral lower extremities--continue 3. Pain Management: Will continue Hydrocodone prn for now 4. Mood: LCSW to follow for evaluation and support.  5. Neuropsych: This patient is not fully capable of making decisions on her own behalf. 6. Skin/Wound Care: Monitor incision for healing. Maintain adequate nutritional and hydration status  -staples out.  7. Fluids/Electrolytes/Nutrition:  push/encourage PO.  8. Seizure prophylaxis: On keppra.  9. H/o SVT/WPW s/p ablation: Monitor HR bid 10. Hypokalemia: improved  K+4.1 on 11/13 11. Leukocytosis: Afebrile  WBCs 12.4 on 11/13 (trending down)  Ua,ucx,cxr negative  -follow for now 12. AKI: Avoid nephrotoxic meds.   Cr 1.09 on 11/13  Encourage fluid intake 13. ABLA:   Hb 11.7 on 11/13 14. Hypotension/Bradycardia:   Asypmtomatic on 11/16  Continue to follow   LOS (Days) 8 A FACE TO FACE EVALUATION WAS PERFORMED  SWARTZ,ZACHARY T 11/06/2016 8:55 AM

## 2016-11-06 NOTE — Progress Notes (Signed)
Speech Language Pathology Daily Session Note  Patient Details  Name: [name] [name] MRN: [id number] Date of Birth: [birth year]  Today's Date: 11/06/2016 SLP Individual Time: 0932-1030 SLP Individual Time Calculation (min): 58 min   Short Term Goals: Week 1: SLP Short Term Goal 1 (Week 1): Pt will sustain her attention to basic familiar tasks for 2-3 minutes with min assist verbal cues for redirection.   SLP Short Term Goal 2 (Week 1): Pt will complete basic, familiar tasks with min assist verbal cues for functional problem solving. SLP Short Term Goal 3 (Week 1): Pt will attend to left upper extremity and the environment during basic tasks with min assist verbal cues.  SLP Short Term Goal 4 (Week 1): Pt will utilize external aids to facilitate recall of daily information with mod assist verbal cues.    Skilled Therapeutic Interventions:Skilled therapy intervention focused on cognitive goals. Patient engaged in oral care given Min A visual cues for problem solving. During a novel card task, patient demonstrated attention to LUE given Min A verbal cues. During novel card task, patient required Mod A verbal and visual cues for problem solving, Min A verbal cues for short term recall of task parameters, and demonstrated selective attention to task for 30 minutes in a minimally distracting environment given supervision verbal cues. Patient left upright in wheelchair with quick release belt on and all needs within reach. Continue current plan of care.    Function:  Cognition Comprehension Comprehension assist level: Understands basic 75 - 89% of the time/ requires cueing 10 - 24% of the time  Expression   Expression assist level: Expresses basic 75 - 89% of the time/requires cueing 10 - 24% of the time. Needs helper to occlude trach/needs to repeat words.  Social Interaction Social Interaction assist level: Interacts appropriately 90% of the time - Needs monitoring or encouragement for participation  or interaction.  Problem Solving Problem solving assist level: Solves basic 50 - 74% of the time/requires cueing 25 - 49% of the time  Memory Memory assist level: Recognizes or recalls 50 - 74% of the time/requires cueing 25 - 49% of the time    Pain Pain Assessment Pain Assessment: No/denies pain  Therapy/Group: Individual Therapy  Thornton Papas 11/06/2016, 1:12 PM

## 2016-11-06 NOTE — Progress Notes (Signed)
Physical Therapy Session Note  Patient Details  Name: [name] [name] MRN: [id number] Date of Birth: [birth year]  Today's Date: 11/06/2016 PT Individual Time: 1030-1100 PT Individual Time Calculation (min): 30 min    Short Term Goals: Week 1:  PT Short Term Goal 1 (Week 1): Pt will be able to demonstrate increased endurance with gait >= 100' with LRAD and steady assist PT Short Term Goal 2 (Week 1): Pt will improve score on Berg Balance Test by 5 points to demonstrate decreased fall risk PT Short Term Goal 3 (Week 1): Pt will demonstrate increased attention to L during functional tasks with min verbal cues needed  Skilled Therapeutic Interventions/Progress Updates:    Session focused on addressing functional gait without AD, simulated car transfer, gait up/dwn ramp and over mulched surface for community mobility re-training, floor transfers and fall recovery education, stair negotiation, and addressing L inattention, awareness, and problem solving. Pt overall supervision today for mobility but requires cues still for cognition min to mod assist. Pt continues to have difficulty with pathfinding to/from room requiring mod assist overall. Planning for family education tomorrow prior to d/c on Sat. Pt denies concerns.    Therapy Documentation Precautions:  Precautions Precautions: Fall Precaution Comments: L inattention Restrictions Weight Bearing Restrictions: No   Pain:  Denies pain.    See Function Navigator for Current Functional Status.   Therapy/Group: Individual Therapy  Canary Brim Ivory Broad, PT, DPT  11/06/2016, 12:51 PM

## 2016-11-06 NOTE — Progress Notes (Signed)
Social Work Patient ID: [name] [name], female   DOB: 07/21/1961, 55 y.o.   MRN: [id number]   Able to speak with pt's ex-spouse today as he was in to visit.  He was already aware of targeted d/c date 11/18 and need for 24/7 supervision.  Told him that I have left messages for pt's mother about need for family ed.  As he is the one who will need to drive her to the hospital, we have set up family ed for mother tomorrow from 10-11:30 and he will let he know.  I plan to follow up with mother tomorrow as well.  Kili Gracy, LCSW

## 2016-11-06 NOTE — Discharge Summary (Addendum)
Physician Discharge Summary  Patient ID: [name] [name] MRN: [id number] DOB/AGE: 1961-07-08 55 y.o.  Admit date: 10/29/2016 Discharge date: 11/08/2016  Discharge Diagnoses:  Principal Problem:   CVA (cerebral vascular accident) [facility]) Active Problems:   Intracranial aneurysm   Cognitive deficit, post-stroke   Idiopathic hypotension   Discharged Condition: stable.   Significant Diagnostic Studies: N/A   Labs:  Basic Metabolic Panel:  Recent Labs Lab 11/03/16 1022  NA 140  K 4.1  CL 110  CO2 23  GLUCOSE 105*  BUN 17  CREATININE 1.09*  CALCIUM 9.1    CBC:  Recent Labs Lab 11/03/16 1022  WBC 12.4*  NEUTROABS 9.4*  HGB 11.7*  HCT 37.4  MCV 85.0  PLT 352    CBG: No results for input(s): GLUCAP in the last 168 hours.  Brief HPI:   [name] [name] a 55 y.o.femalewith history of SAH due to ruptured aneurysm 12/2015 WPW/PSVT, HA; who was admitted on 10/24/16 for clipping of B-MCA, L-PICA and basial tip aneurysms by Dr. Cyndy Freeze. History taken from chart review. Post op developed left sided weakness and CT head done revealing right internal capsule infarct with right frontal and anterior temporal edema likely due to post op swelling and stable post op blood products.  Mentation is improving with improvement in left sided weakness, however, continues to have left neglect with right lean. Therapy ongoing and CIR recommended for follow up therapy  Hospital Course: [name] [name] was admitted to rehab 10/29/2016 for inpatient therapies to consist of PT, ST and OT at least three hours five days a week. Past admission physiatrist, therapy team and rehab RN have worked together to provide customized collaborative inpatient rehab.  She was noted to have leucocytosis at admission but no signs of infection noted. Urine culture and CXR were negative for infection and she has been afebrile. Reactive leucocytosis is resolving. Crani incision is intact and healing well.  Staples were  removed without difficulty.  Follow up lytes shows that AKI is resolving with creatinine down to 1.09.  She is showing improvement attention as well as decrease in left inattention. She has made good progress and is currently at supervision level. She will continue to receive follow up HHPT, [place] and [place] after discharge.    Rehab course: During patient's stay in rehab weekly team conferences were held to monitor patient's progress, set goals and discuss barriers to discharge. At admission, patient required min assist with mobility and min to moderate assist with basic self care tasks. She exhibited moderately  Severe cognitive deficits with poor awareness of deficits with impulsivity. She has had improvement in activity tolerance, balance, postural control, as well as ability to compensate for deficits. She is has had improvement in functional use LUE  and LLE as well as improved awareness. She is able to complete ADL tasks with supervision. She requires supervision for transfer and ambulation with cues to attend to left field. She require min assist with cues for selective attention and for recall of STM. She requires moderate assist with complex problems solving tasks. Family education was completed regarding all aspects of care and safety.    Disposition: 01-Home or Self Care  Diet: Regular.   Special Instructions: 1 Complete medrol dose pack as directed.  2. Needs 24 hours supervision and assistance with medication/financial management.  3. No driving, lifting or strenuous activity.     Medication List    STOP taking these medications   Diclofenac Sodium  3 % Gel   docusate sodium 100 MG capsule Commonly known as:  COLACE   HYDROcodone-acetaminophen 5-325 MG tablet Commonly known as:  NORCO/VICODIN   ibuprofen 200 MG tablet Commonly known as:  ADVIL,MOTRIN   methylPREDNISolone 4 MG tablet Commonly known as:  MEDROL     TAKE these medications    acetaminophen 500 MG tablet Commonly known as:  TYLENOL Take 1,000 mg by mouth daily as needed for moderate pain or headache.   levETIRAcetam 750 MG tablet Commonly known as:  KEPPRA Take 1 tablet (750 mg total) by mouth 2 (two) times daily.   lovastatin 20 MG tablet Commonly known as:  MEVACOR Take 20 mg by mouth at bedtime.   pantoprazole 20 MG tablet Commonly known as:  PROTONIX Take 1 tablet (20 mg total) by mouth daily.   polyethylene glycol packet Commonly known as:  MIRALAX / GLYCOLAX Take 17 g by mouth 2 (two) times daily as needed for mild constipation (give two doses in 24 hour period for mild constipation).   senna-docusate 8.6-50 MG tablet Commonly known as:  Senokot-S Take 2 tablets by mouth 2 (two) times daily.      Follow-up Information    Ankit Lorie Phenix, MD Follow up.   Specialty:  Physical Medicine and Rehabilitation Why:  office will call you with follow up appointment.  Contact information: [address] [place] [address] [phone]        Kevan Ny Ditty, MD. Call in 1 day(s).   Specialty:  Neurosurgery Why:  for post op appointment in next 2-3 weeks.  Contact information: [address] [place] [address] [phone]           Signed: Bary Leriche 11/09/2016, 9:19 PM

## 2016-11-06 NOTE — Patient Care Conference (Signed)
Inpatient RehabilitationTeam Conference and Plan of Care Update Date: 11/05/2016   Time: 2:35 PM    Patient Name: [name] [name]      Medical Record Number: [id number]  Date of Birth: February 01, 1961 Sex: Female         Room/Bed: 4M04C/4M04C-01 Payor Info: Payor: MEDICAID [place] / Plan: MEDICAID [place] ACCESS / Product Type: *No Product type* /    Admitting Diagnosis: R  IC infarct   Admit Date/Time:  10/29/2016  5:11 PM Admission Comments: No comment available   Primary Diagnosis:  <principal problem not specified> Principal Problem: <principal problem not specified>  Patient Active Problem List   Diagnosis Date Noted  . Idiopathic hypotension   . Cognitive deficit, post-stroke   . CVA (cerebral vascular accident) ([place]) 10/29/2016  . Stroke due to embolism of middle cerebral artery ([place]) 10/29/2016  . Seizure prophylaxis   . History of supraventricular tachycardia   . Neurologic abnormality   . Stroke syndrome ([place])   . Vascular headache   . Other secondary hypertension   . Hypokalemia   . AKI (acute kidney injury) ([place])   . Lymphocytosis   . Lethargy   . Brain edema ([place])   . Intracranial aneurysm 07/09/2016  . Communicating hydrocephalus 02/03/2016  . Essential hypertension   . Paroxysmal SVT (supraventricular tachycardia) ([place])   . Paroxysmal atrial fibrillation ([facility])   . Tobacco abuse   . Tachypnea   . Bradycardia   . Leukocytosis   . Acute blood loss anemia   . Thrombocytosis ([place])   . Chest pain   . Nontraumatic subarachnoid hemorrhage ([place])   . Acute respiratory failure ([place])   . Encounter for central line placement   . Hypoxemia   . Renal insufficiency   . Encounter for imaging study to confirm nasogastric (NG) tube placement   . Endotracheally intubated   . Respiratory failure ([place])   . SAH (subarachnoid hemorrhage) ([place])   . Subarachnoid hemorrhage due to ruptured aneurysm ([place]) 01/03/2016  . Subarachnoid hemorrhage ([place]) 01/03/2016  . Atrial fibrillation  with rapid ventricular response ([place])   . Elevated lactic acid level   . SMOKER 03/20/2009  . HYPERTENSION, UNSPECIFIED 03/20/2009  . WPW 03/20/2009  . SVT/ PSVT/ PAT 03/20/2009  . BRONCHITIS 03/20/2009    Expected Discharge Date: Expected Discharge Date: 11/08/16  Team Members Present: Physician leading conference: Dr. Delice Lesch Social Worker Present: Lennart Pall, LCSW Nurse Present: Heather Roberts, RN PT Present: Jorge Mandril, PT OT Present: Benay Pillow, OT SLP Present: [place] Anna, SLP PPS Coordinator present : Daiva Nakayama, RN, CRRN     Current Status/Progress Goal Weekly Team Focus  Medical   Left sided weakness, cognitive deficits and left inattention secondary to right internal capsule infarct after aneurysm clippings with hx of SAH due to ruptured aneurysms  Hypokalemia, Hypotension, Bradycardia, AKI, WBCs, Cognitive deficits, mobility  See above   Bowel/Bladder   Continent B/B; LBM 11/04/2016  Pt will maintain continence status  Monitor for changes in continence and treat accordingly   Swallow/Nutrition/ Hydration             ADL's   supervision  with mod-max verbal cues for safety and min A for balance with higher level skills  Supervision  d/c planning, pt/family edu, self care retraining, cognition   Mobility             Communication             Safety/Cognition/ Behavioral Observations  Max A  Min A  attention, problem solving, attention to left, memory, awareness   Pain   Pt denies pain  less than 4  Manage pain with pharmacological and/or nonpharmacological therapy   Skin   Surgical incision with staples to R side of head; edges approximated  Pt will maintain skin integrity and skin will be free of infection/breakdown  Monitor for changes in general skin condition including surgical incision    Rehab Goals Patient on target to meet rehab goals: Yes *See Care Plan and progress notes for long and short-term goals.  Barriers to  Discharge: Hypokalemia, hypotension, bradycardia, AKI, leukocytosis, cognitive deficits, mobility, safety    Possible Resolutions to Barriers:  Follow labs, follow vitals, therapies    Discharge Planning/Teaching Needs:  Home with mother and ex-husband to assist.  Mother to be primary caregiver.  Teaching to be scheduled prior to d/c.   Team Discussion:  Medically stable;  Chronic issues.  Supervision to steady assist with ADLs.  Cognition very impaired but likely close to baseline.  Eating constently (food must not be left within reach).  Super/ min assist with mobility.  All goals set for supervision.  Revisions to Treatment Plan:  None   Continued Need for Acute Rehabilitation Level of Care: The patient requires daily medical management by a physician with specialized training in physical medicine and rehabilitation for the following conditions: Daily direction of a multidisciplinary physical rehabilitation program to ensure safe treatment while eliciting the highest outcome that is of practical value to the patient.: Yes Daily medical management of patient stability for increased activity during participation in an intensive rehabilitation regime.: Yes Daily analysis of laboratory values and/or radiology reports with any subsequent need for medication adjustment of medical intervention for : Neurological problems;Post surgical problems;Other  [name] [name] 11/06/2016, 1:40 PM

## 2016-11-07 ENCOUNTER — Inpatient Hospital Stay (HOSPITAL_COMMUNITY): Payer: Medicaid Other | Admitting: Occupational Therapy

## 2016-11-07 ENCOUNTER — Other Ambulatory Visit: Payer: Self-pay | Admitting: *Deleted

## 2016-11-07 ENCOUNTER — Encounter (HOSPITAL_COMMUNITY): Payer: Medicaid Other | Admitting: Occupational Therapy

## 2016-11-07 ENCOUNTER — Inpatient Hospital Stay (HOSPITAL_COMMUNITY): Payer: Medicaid Other | Admitting: Physical Therapy

## 2016-11-07 ENCOUNTER — Ambulatory Visit (HOSPITAL_COMMUNITY): Payer: Medicaid Other | Admitting: Physical Therapy

## 2016-11-07 ENCOUNTER — Inpatient Hospital Stay (HOSPITAL_COMMUNITY): Payer: Medicaid Other | Admitting: Speech Pathology

## 2016-11-07 MED ORDER — SENNOSIDES-DOCUSATE SODIUM 8.6-50 MG PO TABS: 2.0000 | ORAL_TABLET | Freq: Two times a day (BID) | ORAL | 0 refills | Status: DC

## 2016-11-07 MED ORDER — LEVETIRACETAM 750 MG PO TABS: 750.0000 mg | ORAL_TABLET | Freq: Two times a day (BID) | ORAL | 2 refills | Status: DC

## 2016-11-07 MED ORDER — POLYETHYLENE GLYCOL 3350 17 G PO PACK: 17.0000 g | PACK | Freq: Two times a day (BID) | ORAL | 0 refills | Status: DC | PRN

## 2016-11-07 MED ORDER — PANTOPRAZOLE SODIUM 20 MG PO TBEC: 20.0000 mg | DELAYED_RELEASE_TABLET | Freq: Every day | ORAL | 0 refills | Status: DC

## 2016-11-07 NOTE — Progress Notes (Signed)
Physical Therapy Discharge Summary  Patient Details  Name: [name] [name] MRN: [id number] Date of Birth: 03-Oct-1961  Today's Date: 11/07/2016 PT Individual Time: 1045-1130 PT Individual Time Calculation (min): 45 min    Patient has met 10 of 10 long term goals due to improved activity tolerance, improved balance, improved postural control, increased strength, ability to compensate for deficits, functional use of  left upper extremity and left lower extremity, improved attention, improved awareness and improved coordination.  Patient to discharge at an ambulatory level Supervision.   Patient's care partner is independent to provide the necessary cognitive assistance at discharge.  Reasons goals not met: All goals met  Recommendation:  Patient will benefit from ongoing skilled PT services in home health setting to continue to advance safe functional mobility, address ongoing impairments in L sided weakness, impaired motor planning/coordination, impaired cognition and attention, impaired safety, impaired postural control, balance, gait, and minimize fall risk.  Equipment: No equipment provided  Reasons for discharge: treatment goals met and discharge from hospital  Patient/family agrees with progress made and goals achieved: Yes  PT Discharge Pt received in room with ex-husband and mother present; despite ex-husband to bring pt home tomorrow, only mother accompanied pt to therapy for education.  Mother reports that each time the pt has been in the hospital, her ex-husband has been the one to assist her at D/C and is experienced with assisting her.  Pt performed transfers, gait, car transfer, stair negotiation and floor transfers at supervision level as documented below with mother observing.  Provided pt and mother education on safety during mobility in the home and in the community with focus on attention to L environment, LUE and LLE, selective attention to task and safety during functional  home/kitchen tasks.  Pt and mother verbalized understanding.  Performed re-assessment of LE strength and sensation.  Returned to room and pt left in recliner with family present and SLP present to continue family education.      Precautions/RestrictionsRestrictions Weight Bearing Restrictions: No Pain Pain Assessment Pain Assessment: No/denies pain Vision/Perception  Inattention to L body and L environment  Cognition Orientation Level: Oriented X4 Sensation Sensation Light Touch: Appears Intact Stereognosis: Not tested Hot/Cold: Not tested Proprioception: Impaired by gross assessment Additional Comments: decreased awareness of LUE;  Coordination Gross Motor Movements are Fluid and Coordinated: Not tested Fine Motor Movements are Fluid and Coordinated: Not tested Motor  Motor Motor: Other (comment);Abnormal postural alignment and control Motor - Discharge Observations: Decreased initiation/motor planning/coordination on L, L sided weakness  Mobility Bed Mobility Bed Mobility: Supine to Sit;Sit to Supine Supine to Sit: 5: Supervision Sit to Supine: 5: Supervision Transfers Transfers: Yes Stand Pivot Transfers: 5: Supervision Locomotion  Ambulation Ambulation: Yes Ambulation/Gait Assistance: 5: Supervision Ambulation Distance (Feet): 150 Feet Assistive device: None Gait Gait: Yes Gait Pattern: Impaired Gait Pattern: Step-through pattern;Decreased step length - left;Decreased stride length;Decreased dorsiflexion - left;Poor foot clearance - left Stairs / Additional Locomotion Stairs: Yes Stairs Assistance: 5: Supervision Stair Management Technique: One rail Right;Step to pattern;Forwards Number of Stairs: 12 Height of Stairs: 6.5 Wheelchair Mobility Wheelchair Mobility: No  Trunk/Postural Assessment  Cervical Assessment Cervical Assessment: Exceptions to [facility] (still with R gaze preference and R rotation) Thoracic Assessment Thoracic Assessment: Exceptions to [facility]  (flexed) Lumbar Assessment Lumbar Assessment: Exceptions to [facility] (posterior tilt) Postural Control Postural Control: Deficits on evaluation (impaired balance strategies)  Balance Berg Balance Test Sit to Stand: Able to stand  independently using hands Standing Unsupported: Able to stand safely 2  minutes Sitting with Back Unsupported but Feet Supported on Floor or Stool: Able to sit safely and securely 2 minutes Stand to Sit: Sits safely with minimal use of hands Transfers: Able to transfer safely, definite need of hands Standing Unsupported with Eyes Closed: Able to stand 10 seconds safely Standing Ubsupported with Feet Together: Able to place feet together independently and stand for 1 minute with supervision From Standing, Reach Forward with Outstretched Arm: Can reach forward >12 cm safely (5") From Standing Position, Pick up Object from Floor: Able to pick up shoe, needs supervision From Standing Position, Turn to Look Behind Over each Shoulder: Looks behind one side only/other side shows less weight shift Turn 360 Degrees: Able to turn 360 degrees safely but slowly Standing Unsupported, Alternately Place Feet on Step/Stool: Able to complete >2 steps/needs minimal assist Standing Unsupported, One Foot in Front: Needs help to step but can hold 15 seconds Standing on One Leg: Unable to try or needs assist to prevent fall Total Score: 38 Static Sitting Balance Static Sitting - Level of Assistance: 7: Independent Dynamic Sitting Balance Dynamic Sitting - Level of Assistance: 6: Modified independent (Device/Increase time) Static Standing Balance Static Standing - Level of Assistance: 5: Stand by assistance Dynamic Standing Balance Dynamic Standing - Level of Assistance: 5: Stand by assistance Extremity Assessment      RLE Assessment RLE Assessment: Within Functional Limits LLE Assessment LLE Assessment:  (4/5; delayed)   See Function Navigator for Current Functional  Status.  Raylene Everts F[place]ette 11/07/2016, 12:28 PM

## 2016-11-07 NOTE — Progress Notes (Signed)
Physical Therapy Session Note  Patient Details  Name: [name] [name] MRN: [id number] Date of Birth: [birth year]  Today's Date: 11/07/2016 PT Individual Time: 1330-1400 PT Individual Time Calculation (min): 30 min    Short Term Goals: Week 1:  PT Short Term Goal 1 (Week 1): Pt will be able to demonstrate increased endurance with gait >= 100' with LRAD and steady assist PT Short Term Goal 2 (Week 1): Pt will improve score on Berg Balance Test by 5 points to demonstrate decreased fall risk PT Short Term Goal 3 (Week 1): Pt will demonstrate increased attention to L during functional tasks with min verbal cues needed  Skilled Therapeutic Interventions/Progress Updates:   Pt presented sitting in chair agreeable to therapy.  Ambulated 128ft with no AD no significant deviations. Balance on compliant surface reaching outside BoS with forced use of LUE. Dynamic balance/coordination activities tapping targets with accuracy 80% of time.  Additional balance ball toss with single foot on step for increased single LE use.  Pt able to perform with no LOB. Pt returned to room in same manner as previous and left in chair with QRB on and call bell within reach.   Therapy Documentation Precautions:  Precautions Precautions: Fall Precaution Comments: L inattention Restrictions Weight Bearing Restrictions: No General:   Vital Signs: Therapy Vitals Temp: 98.1 F (36.7 C) Temp Source: Oral Pulse Rate: 63 Resp: 18 BP: 93/69 Patient Position (if appropriate): Sitting Oxygen Therapy SpO2: 100 % O2 Device: Not Delivered Pain: Pain Assessment Pain Assessment: No/denies pain Mobility: Bed Mobility Bed Mobility: Supine to Sit;Sit to Supine Supine to Sit: 5: Supervision Sit to Supine: 5: Supervision Transfers Transfers: Yes Stand Pivot Transfers: 5: Supervision Locomotion : Ambulation Ambulation: Yes Ambulation/Gait Assistance: 5: Supervision Ambulation Distance (Feet): 150 Feet Assistive  device: None Gait Gait: Yes Gait Pattern: Impaired Gait Pattern: Step-through pattern;Decreased step length - left;Decreased stride length;Decreased dorsiflexion - left;Poor foot clearance - left Stairs / Additional Locomotion Stairs: Yes Stairs Assistance: 5: Supervision Stair Management Technique: One rail Right;Step to pattern;Forwards Number of Stairs: 12 Height of Stairs: 6.5 Wheelchair Mobility Wheelchair Mobility: No  Trunk/Postural Assessment : Cervical Assessment Cervical Assessment: Exceptions to [facility] (still with R gaze preference and R rotation) Thoracic Assessment Thoracic Assessment: Exceptions to [facility] (flexed) Lumbar Assessment Lumbar Assessment: Exceptions to [facility] (posterior tilt) Postural Control Postural Control: Deficits on evaluation (impaired balance strategies)  Balance: Berg Balance Test Sit to Stand: Able to stand  independently using hands Standing Unsupported: Able to stand safely 2 minutes Sitting with Back Unsupported but Feet Supported on Floor or Stool: Able to sit safely and securely 2 minutes Stand to Sit: Sits safely with minimal use of hands Transfers: Able to transfer safely, definite need of hands Standing Unsupported with Eyes Closed: Able to stand 10 seconds safely Standing Ubsupported with Feet Together: Able to place feet together independently and stand for 1 minute with supervision From Standing, Reach Forward with Outstretched Arm: Can reach forward >12 cm safely (5") From Standing Position, Pick up Object from Floor: Able to pick up shoe, needs supervision From Standing Position, Turn to Look Behind Over each Shoulder: Looks behind one side only/other side shows less weight shift Turn 360 Degrees: Able to turn 360 degrees safely but slowly Standing Unsupported, Alternately Place Feet on Step/Stool: Able to complete >2 steps/needs minimal assist Standing Unsupported, One Foot in Front: Needs help to step but can hold 15 seconds Standing on  One Leg: Unable to try or needs assist to prevent  fall Total Score: 38 Static Sitting Balance Static Sitting - Level of Assistance: 7: Independent Dynamic Sitting Balance Dynamic Sitting - Level of Assistance: 6: Modified independent (Device/Increase time) Static Standing Balance Static Standing - Level of Assistance: 5: Stand by assistance Dynamic Standing Balance Dynamic Standing - Level of Assistance: 5: Stand by assistance Exercises:   Other Treatments:     See Function Navigator for Current Functional Status.   Therapy/Group: Individual Therapy  Aylan Bayona  Nazli Penn, PTA  11/07/2016, 3:46 PM

## 2016-11-07 NOTE — Progress Notes (Signed)
[facility] PHYSICAL MEDICINE & REHABILITATION     PROGRESS NOTE  Subjective/Complaints:  Lying in bed without complaints. "am I going home today?"  ROS remains limited by cognition  Objective: Vital Signs: Blood pressure (!) 111/45, pulse 67, temperature 98.3 F (36.8 C), temperature source Oral, resp. rate 16, weight 102 kg (224 lb 13.9 oz), SpO2 100 %. No results found. No results for input(s): WBC, HGB, HCT, PLT in the last 72 hours. No results for input(s): NA, K, CL, GLUCOSE, BUN, CREATININE, CALCIUM in the last 72 hours.  Invalid input(s): CO CBG (last 3)  No results for input(s): GLUCAP in the last 72 hours.  Wt Readings from Last 3 Encounters:  11/05/16 102 kg (224 lb 13.9 oz)  10/29/16 89.4 kg (197 lb)  10/20/16 89.5 kg (197 lb 4.8 oz)    Physical Exam:  BP (!) 111/45 (BP Location: Right Arm)   Pulse 67   Temp 98.3 F (36.8 C) (Oral)   Resp 16   Wt 102 kg (224 lb 13.9 oz)   SpO2 100%   BMI 39.83 kg/m  Constitutional: She appears well-developed and well-nourished. NAD. HENT: staples out..   Eyes: right ptosis Cardiovascular: RRR Respiratory: clear no wheezes  GI: BS+  Musculoskeletal: No edema or tenderness in extremitites  Neurological: She is alert  .  Left inattention present.  Right ptosis and left facial weakness persists  lmiited awareness of deficits.  Motor: 4+/5 grossly bilaterally Skin: Skin is warm and dry. No drainage Psychiatric: pleasant and cooperative.   Assessment/Plan: 1. 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 Physiatrist is providing close team supervision and 24 hour management of active medical problems listed below. Physiatrist and rehab team continue to assess barriers to discharge/monitor patient progress toward functional and medical  goals.  Function:  Bathing Bathing position Bathing activity did not occur: Refused Position: Production manager parts bathed by patient: Right arm, Left arm, Chest, Abdomen, Front perineal area, Buttocks, Right upper leg, Left upper leg, Right lower leg, Left lower leg Body parts bathed by helper: Back  Bathing assist Assist Level: Touching or steadying assistance(Pt > 75%)      Upper Body Dressing/Undressing Upper body dressing   What is the patient wearing?: Hospital gown     Pull over shirt/dress - Perfomed by patient: Thread/unthread right sleeve, Thread/unthread left sleeve, Put head through opening Pull over shirt/dress - Perfomed by helper: Pull shirt over trunk        Upper body assist Assist Level: Set up   Set up : To obtain clothing/put away  Lower Body Dressing/Undressing Lower body dressing   What is the patient wearing?: Non-skid slipper socks Underwear - Performed by patient: Pull underwear up/down Underwear - Performed by helper: Pull underwear up/down Pants- Performed by patient: Pull pants up/down Pants- Performed by helper: Pull pants up/down Non-skid slipper socks- Performed by patient: Don/doff right sock, Don/doff left sock Non-skid slipper socks- Performed by helper: Don/doff right sock, Don/doff left sock                  Lower body assist Assist for lower body dressing: Touching or steadying assistance (Pt > 75%)      Toileting Toileting   Toileting steps completed by patient: Adjust clothing prior to toileting, Adjust clothing after toileting, Performs perineal hygiene Toileting  steps completed by helper: Adjust clothing prior to toileting, Performs perineal hygiene, Adjust clothing after toileting (per [name] [name], NT) Toileting Assistive Devices: Grab bar or rail  Toileting assist Assist level: Supervision or verbal cues   Transfers Chair/bed transfer   Chair/bed transfer method: Ambulatory Chair/bed transfer assist level:  Supervision or verbal cues Chair/bed transfer assistive device: Armrests     Locomotion Ambulation     Max distance: 150 Assist level: Supervision or verbal cues   Wheelchair   Type: Manual Max wheelchair distance: 25 Assist Level: Touching or steadying assistance (Pt > 75%)  Cognition Comprehension Comprehension assist level: Understands basic 75 - 89% of the time/ requires cueing 10 - 24% of the time  Expression Expression assist level: Expresses basic 75 - 89% of the time/requires cueing 10 - 24% of the time. Needs helper to occlude trach/needs to repeat words.  Social Interaction Social Interaction assist level: Interacts appropriately 90% of the time - Needs monitoring or encouragement for participation or interaction.  Problem Solving Problem solving assist level: Solves basic 50 - 74% of the time/requires cueing 25 - 49% of the time  Memory Memory assist level: Recognizes or recalls 50 - 74% of the time/requires cueing 25 - 49% of the time    Medical Problem List and Plan: 1.  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   Cont CIR therapies 2.  DVT Prophylaxis/Anticoagulation: Mechanical: Sequential compression devices, below knee Bilateral lower extremities--continue 3. Pain Management: Will continue Hydrocodone effective 4. Mood: LCSW to follow for evaluation and support.  5. Neuropsych: This patient is not fully capable of making decisions on her own behalf. 6. Skin/Wound Care: Monitor incision for healing. Maintain adequate nutritional and hydration status  -staples out.  7. Fluids/Electrolytes/Nutrition: push/encourage PO. Seems to have good app.  8. Seizure prophylaxis: On keppra.  9. H/o SVT/WPW s/p ablation: Monitor HR bid 10. Hypokalemia: improved  K+4.1 on 11/13 11. Leukocytosis: Afebrile  WBCs 12.4 on 11/13 (trending down)  Ua,ucx,cxr  negative  -recheck Monday 12. AKI: Avoid nephrotoxic meds.   Cr 1.09 on 11/13  Push fluids 13. ABLA:   Hb 11.7 on 11/13 14. Hypotension/Bradycardia:   Asypmtomatic on 11/16  Continue to follow   LOS (Days) 9 A FACE TO FACE EVALUATION WAS PERFORMED  [name] [name] T 11/07/2016 8:36 AM

## 2016-11-07 NOTE — Progress Notes (Signed)
Occupational Therapy Discharge Summary  Patient Details  Name: [name] [name] MRN: [id number] Date of Birth: 1960-12-29  Today's Date: 11/07/2016 OT Individual Time: 6734-1937 and 9024-0973 OT Individual Time Calculation (min): 42 min and 47 min  Patient has met 13 out of 13 long term goals due to improved activity tolerance, improved balance, postural control, functional use of  LEFT upper extremity, improved attention, improved awareness and improved coordination.  Patient to discharge at overall Supervision level.  Patient's care partner, mother, is independent to provide the necessary cognitive assistance at discharge.     All goals met.   Recommendation:  Patient will benefit from ongoing skilled OT services in home health setting to continue to advance functional skills in the area of BADL.  Equipment: Shower Tub bench  Reasons for discharge: treatment goals met  Patient/family agrees with progress made and goals achieved: Yes   Skilled Therapeutic Intervention:  Pt was lying in bed with family present at time of arrival. Pt was agreeable to complete ADL routine with mother present for caregiver education. Pt was provided questioning cues for appropriate items to gather for shower. She pushed w/c down hallway with ADL items and close supervision. Showering completed in tub room with caregiver report of needing a tub bench for home. SW notified after tx. Bathing/dressing/toileting/oral care completed with overall supervision for left inattention, L UE NMR, safety, and encouraging self problem solving. Caregiver asked questions throughout tx and was receptive to feedback. Pt then ambulated back to room and was left in recliner with safety belt donned, all needs within reach and fmaily present.  2nd Session 1:1 Tx (42 minutes) Pt participated in skilled OT session focusing on L UE NMR with individualized home exercise program. Pt was provided a packet regarding various theraputty  exercises to complete in daily routine. With max vcs for sustained attention, pt was able to complete program with overall supervision while seated in room recliner. Pt was educated regarding goal achievement and [place] post discharge with verbalized understanding. At end of tx pt was left with all needs within reach.   OT Discharge Precautions/Restrictions Precautions Precautions: Fall Precaution Comments:  (left inattention and left hemiparesis) Restrictions Weight Bearing Restrictions: No   ADL ADL ADL Comments: Please functional navigator for ADL status Vision/Perception  Vision- History Baseline Vision/History: No visual deficits Patient Visual Report: No change from baseline Vision- Assessment Additional Comments:  (left neglect)  Cognition Overall Cognitive Status: Impaired/Different from baseline Arousal/Alertness: Lethargic Orientation Level: Oriented X4 Attention: Sustained Sustained Attention: Impaired Memory: Impaired Memory Impairment: Decreased recall of new information Awareness: Impaired Problem Solving: Impaired Behaviors: Impulsive Safety/Judgment: Impaired Sensation Sensation Light Touch: Appears Intact Stereognosis: Not tested Hot/Cold: Appears Intact Proprioception: Not tested Coordination Gross Motor Movements are Fluid and Coordinated: Yes (during basic self care completion) Fine Motor Movements are Fluid and Coordinated: No (impaired [place] of L UE) Motor  Motor Motor: Hemiplegia Motor - Discharge Observations:  (Left sided weakness, left neglect) Mobility  Bed Mobility Bed Mobility: Supine to Sit;Sit to Supine Supine to Sit: 5: Supervision Transfers Transfers: Sit to Stand Sit to Stand: 5: Supervision  Trunk/Postural Assessment  Cervical Assessment Cervical Assessment: Exceptions to [facility] (right gaze preference) Thoracic Assessment Thoracic Assessment: Exceptions to [facility] (forward flexed) Lumbar Assessment Lumbar Assessment: Exceptions to [facility]  (posterior pelvic tilt) Postural Control Postural Control: Deficits on evaluation  Balance Balance Balance Assessed: Yes Extremity/Trunk Assessment RUE Assessment RUE Assessment: Within Functional Limits LUE Assessment LUE Assessment: Exceptions to [facility] (3-/5 MMT)  See Function Navigator for Current Functional Status.  Eyan Hagood A Jalah Warmuth 11/07/2016, 7:02 PM

## 2016-11-07 NOTE — Progress Notes (Signed)
Speech Language Pathology Discharge Summary  Patient Details  Name: [name] [name] MRN: [id number] Date of Birth: Jul 04, 1961  Today's Date: 11/07/2016 SLP Individual Time: 1130-1200 SLP Individual Time Calculation (min): 30 min    Skilled Therapeutic Interventions:  Pt and fmaily education completed with emphasis on requirement for 24/7 supervision and strategies to improve attention and decrease impulsivity. Specifically discussed decreasing environmental distractions and putting utensil down between bites. Encouraged rehearsal of strategies with pt. Pt is able to verbalize strategies, but provides regular cueing to implement strategies. Pt with improved attention in session once TV turned off and door closed and eye contact gained. Underscored that pt is not capable of problem solving for basic tasks at this point and will require assist with all activities. Family verbalized agreement with education.     Patient has met 3 of 3 long term goals.  Patient to discharge at overall Mod level.  Reasons goals not met:     Clinical Impression/Discharge Summary:   Pt continues to require min- mod A with all cognitive-linguistic activities. Pt's largest barriers to independence remain impulsivity, attention and problem solving. Pt will require 24/7 supervision and ongoing SLP therapy.  Care Partner:  Caregiver Able to Provide Assistance: Yes  Type of Caregiver Assistance: Cognitive  Recommendation:  Home Health SLP;24 hour supervision/assistance;Outpatient SLP  Rationale for SLP Follow Up: Reduce caregiver burden;Maximize cognitive function and independence   Equipment: none   Reasons for discharge: Discharged from hospital   Patient/Family Agrees with Progress Made and Goals Achieved: Yes   Function:  Eating Eating   Modified Consistency Diet: No Eating Assist Level: Set up assist for;Supervision or verbal cues           Cognition Comprehension Comprehension assist level:  Understands basic 75 - 89% of the time/ requires cueing 10 - 24% of the time  Expression   Expression assist level: Expresses basic 75 - 89% of the time/requires cueing 10 - 24% of the time. Needs helper to occlude trach/needs to repeat words.  Social Interaction Social Interaction assist level: Interacts appropriately 90% of the time - Needs monitoring or encouragement for participation or interaction.  Problem Solving Problem solving assist level: Solves basic 50 - 74% of the time/requires cueing 25 - 49% of the time  Memory Memory assist level: Recognizes or recalls 25 - 49% of the time/requires cueing 50 - 75% of the time   Vinetta Bergamo MA, CCC-SLP 11/07/2016, 11:59 AM

## 2016-11-07 NOTE — Discharge Instructions (Signed)
Inpatient Rehab Discharge Instructions  [name] [name] Discharge date and time: 11/08/16   Activities/Precautions/ Functional Status: Activity: no lifting, driving, or strenuous exercise till cleared by MD.  Diet: regular diet Wound Care: keep wound clean and dry   Functional status:  ___ No restrictions     ___ Walk up steps independently _X__ 24/7 supervision/assistance   ___ Walk up steps with assistance ___ Intermittent supervision/assistance  ___ Bathe/dress independently ___ Walk with walker     __X_ Bathe/dress with supervision.  ___ Walk Independently    ___ Shower independently ___ Walk with assistance    ___ Shower with assistance _X__ No alcohol     ___ Return to work/school ________    COMMUNITY REFERRALS UPON DISCHARGE:    Home Health:   PT     OT     ST    SW                   Agency: [facility] Phone: [phone]   Medical Equipment/Items Ordered: tub bench                                                     Agency/Supplier:  [facility]      Special Instructions:    My questions have been answered and I understand these instructions. I will adhere to these goals and the provided educational materials after my discharge from the hospital.  Patient/Caregiver Signature _______________________________ Date __________  Clinician Signature _______________________________________ Date __________  Please bring this form and your medication list with you to all your follow-up doctor's appointments.

## 2016-11-08 NOTE — Progress Notes (Signed)
Patient discharged to home with family about 16. Discharge instructions went over with patient and patient's husband. All questions answered. Vitals stable.

## 2016-11-08 NOTE — Progress Notes (Signed)
[facility] PHYSICAL MEDICINE & REHABILITATION     PROGRESS NOTE  Subjective/Complaints:   "going home today!"  ROS remains limited by cognition  Objective: Vital Signs: Blood pressure 100/76, pulse 70, temperature 98.4 F (36.9 C), temperature source Oral, resp. rate 18, weight 102 kg (224 lb 13.9 oz), SpO2 99 %. No results found. No results for input(s): WBC, HGB, HCT, PLT in the last 72 hours. No results for input(s): NA, K, CL, GLUCOSE, BUN, CREATININE, CALCIUM in the last 72 hours.  Invalid input(s): CO CBG (last 3)  No results for input(s): GLUCAP in the last 72 hours.  Wt Readings from Last 3 Encounters:  11/05/16 102 kg (224 lb 13.9 oz)  10/29/16 89.4 kg (197 lb)  10/20/16 89.5 kg (197 lb 4.8 oz)    Physical Exam:  BP 100/76 (BP Location: Left Arm)   Pulse 70   Temp 98.4 F (36.9 C) (Oral)   Resp 18   Wt 102 kg (224 lb 13.9 oz)   SpO2 99%   BMI 39.83 kg/m  Constitutional: She appears well-developed and well-nourished. NAD. HENT: staples out..   Eyes: right ptosis Cardiovascular: RRR Respiratory: cta b  GI: BS+, NT  Musculoskeletal: No edema or tenderness in extremitites  Neurological: She is alert  .  Left  neglect.  Right ptosis and left facial weakness stable lmiited awareness of deficits.  Motor: 4+/5 grossly bilaterally Skin: Skin is warm and dry. No drainage Psychiatric: pleasant and cooperative.   Assessment/Plan: 1. 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 Physiatrist is providing close team supervision and 24 hour management of active medical problems listed below. Physiatrist and rehab team continue to assess barriers to discharge/monitor patient progress toward functional and medical goals.  Function:  Bathing Bathing position Bathing activity did not  occur: Refused Position: Production manager parts bathed by patient: Right arm, Left arm, Chest, Abdomen, Front perineal area, Buttocks, Right upper leg, Left upper leg, Right lower leg, Left lower leg, Back Body parts bathed by helper: Back  Bathing assist Assist Level: Supervision or verbal cues      Upper Body Dressing/Undressing Upper body dressing   What is the patient wearing?: Pull over shirt/dress     Pull over shirt/dress - Perfomed by patient: Thread/unthread right sleeve, Thread/unthread left sleeve, Put head through opening Pull over shirt/dress - Perfomed by helper: Pull shirt over trunk        Upper body assist Assist Level: Supervision or verbal cues   Set up : To obtain clothing/put away  Lower Body Dressing/Undressing Lower body dressing   What is the patient wearing?: Pants, Non-skid slipper socks Underwear - Performed by patient: Pull underwear up/down Underwear - Performed by helper: Pull underwear up/down Pants- Performed by patient: Thread/unthread right pants leg, Thread/unthread left pants leg, Pull pants up/down Pants- Performed by helper: Pull pants up/down Non-skid slipper socks- Performed by patient: Don/doff right sock, Don/doff left sock Non-skid slipper socks- Performed by helper: Don/doff right sock, Don/doff left sock                  Lower body assist Assist for lower body dressing: Supervision or verbal cues      Toileting Toileting   Toileting steps completed by patient: Adjust clothing prior to toileting, Adjust clothing after toileting, Performs perineal hygiene Toileting steps  completed by helper: Adjust clothing prior to toileting, Performs perineal hygiene, Adjust clothing after toileting (per Blen Enuol, NT) Toileting Assistive Devices: Grab bar or rail  Toileting assist Assist level: Supervision or verbal cues   Transfers Chair/bed transfer   Chair/bed transfer method: Ambulatory Chair/bed transfer assist level:  Supervision or verbal cues Chair/bed transfer assistive device: Armrests     Locomotion Ambulation     Max distance: 150 Assist level: Supervision or verbal cues   Wheelchair Wheelchair activity did not occur: N/A Type: Manual Max wheelchair distance: 25 Assist Level: Touching or steadying assistance (Pt > 75%)  Cognition Comprehension Comprehension assist level: Understands basic 75 - 89% of the time/ requires cueing 10 - 24% of the time  Expression Expression assist level: Expresses basic 75 - 89% of the time/requires cueing 10 - 24% of the time. Needs helper to occlude trach/needs to repeat words.  Social Interaction Social Interaction assist level: Interacts appropriately 90% of the time - Needs monitoring or encouragement for participation or interaction.  Problem Solving Problem solving assist level: Solves basic 50 - 74% of the time/requires cueing 25 - 49% of the time  Memory Memory assist level: Recognizes or recalls 25 - 49% of the time/requires cueing 50 - 75% of the time    Medical Problem List and Plan: 1.  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   Home today. Goals met  -Patient to see dr patel in the office for transitional care encounter in 1-2 weeks.  2.  DVT Prophylaxis/Anticoagulation: Mechanical: Sequential compression devices, below knee Bilateral lower extremities--continue 3. Pain Management: Will continue Hydrocodone effective 4. Mood: LCSW to follow for evaluation and support.  5. Neuropsych: This patient is not fully capable of making decisions on her own behalf. 6. Skin/Wound Care: Monitor incision for healing. Maintain adequate nutritional and hydration status  -staples out.  7. Fluids/Electrolytes/Nutrition: push/encourage PO. Seems to have good app.  8. Seizure prophylaxis: On keppra.  9. H/o SVT/WPW s/p ablation: Monitor  HR bid 10. Hypokalemia: improved  K+4.1 on 11/13 11. Leukocytosis: Afebrile  WBCs 12.4 on 11/13 (trending down)  Ua,ucx,cxr negative    12. AKI: Avoid nephrotoxic meds.   Cr 1.09 on 11/13  Push fluids 13. ABLA:   Hb 11.7 on 11/13 14. Hypotension/Bradycardia:   Asypmtomatic on 11/16  Continue to follow   LOS (Days) 10 A FACE TO FACE EVALUATION WAS PERFORMED  SWARTZ,ZACHARY T 11/08/2016 8:09 AM

## 2016-11-09 NOTE — Progress Notes (Signed)
Social Work  Discharge Note  The overall goal for the admission was met for:   Discharge location: Yes - home with mother who will provide 24/7 supervision  Length of Stay: Yes - 10 days  Discharge activity level: Yes - supervision  Home/community participation: Yes  Services provided included: MD, RD, PT, OT, SLP, RN, TR, Pharmacy and [place]: Medicaid  Follow-up services arranged: Home Health: PT, OT, ST, SW via [facility], DME: tub bench via [facility] and Patient/Family has no preference for HH/DME agencies  Comments (or additional information):  Patient/Family verbalized understanding of follow-up arrangements: Yes  Individual responsible for coordination of the follow-up plan: pt/mother  Confirmed correct DME delivered: Felicie Kocher 11/09/2016    Phyllip Claw

## 2016-11-10 ENCOUNTER — Telehealth: Payer: Self-pay

## 2016-11-10 NOTE — Telephone Encounter (Signed)
First attempt to contact Ms [place] for Tc call.

## 2016-11-12 ENCOUNTER — Telehealth: Payer: Self-pay | Admitting: *Deleted

## 2016-11-12 NOTE — Telephone Encounter (Signed)
Error

## 2016-11-12 NOTE — Telephone Encounter (Signed)
2nd attempt to reach Ms Maslen for Transitional care call.  No answer. Left message on VM with office number and address and appoint arrival time and appt time to see Dr Naaman Plummer 11/17/16 (2:40 arrive by 2:20).  Left number to call office.  Also called mother's number but no answer at that number either.

## 2016-11-17 ENCOUNTER — Encounter (HOSPITAL_BASED_OUTPATIENT_CLINIC_OR_DEPARTMENT_OTHER): Payer: Medicaid Other | Admitting: Physical Medicine & Rehabilitation

## 2016-11-17 ENCOUNTER — Encounter: Payer: Medicaid Other | Attending: Physical Medicine & Rehabilitation | Admitting: Physical Medicine & Rehabilitation

## 2016-11-17 ENCOUNTER — Encounter: Payer: Self-pay | Admitting: Physical Medicine & Rehabilitation

## 2016-11-17 VITALS — BP 129/80 | HR 60

## 2016-11-17 DIAGNOSIS — M7581 Other shoulder lesions, right shoulder: Secondary | ICD-10-CM

## 2016-11-17 DIAGNOSIS — I10 Essential (primary) hypertension: Secondary | ICD-10-CM | POA: Diagnosis not present

## 2016-11-17 DIAGNOSIS — G8194 Hemiplegia, unspecified affecting left nondominant side: Secondary | ICD-10-CM

## 2016-11-17 DIAGNOSIS — I609 Nontraumatic subarachnoid hemorrhage, unspecified: Secondary | ICD-10-CM | POA: Insufficient documentation

## 2016-11-17 DIAGNOSIS — I608 Other nontraumatic subarachnoid hemorrhage: Secondary | ICD-10-CM | POA: Diagnosis not present

## 2016-11-17 DIAGNOSIS — M25511 Pain in right shoulder: Secondary | ICD-10-CM | POA: Diagnosis not present

## 2016-11-17 NOTE — Progress Notes (Signed)
Subjective:    Patient ID: [name] [name], female    DOB: 07-05-1961, 55 y.o.   MRN: [id number]       TRANSITIONAL CARE VISIT HPI   This is an initial follow up visit for [name] [name]. She states she's doing well. Her right shoulder is tender at times. She is using an OTC arthritis cream with some relief. Sometimes this wakes her up at night. Home health is coming out for PT and OT. She states they are working on her left upper extremity movement as well as some exercises for her right shoulder.  She denies any falls. Her bowels and bladder are working fairly well. She finds it difficult to get in her tub as it's an old fashioned raised tub.   There have been no seizures. She remains on keppra.   Pt is at home with family. Family provide supervision and appear supportive.      Current Outpatient Prescriptions:  .  acetaminophen (TYLENOL) 500 MG tablet, Take 1,000 mg by mouth daily as needed for moderate pain or headache., Disp: , Rfl:  .  levETIRAcetam (KEPPRA) 750 MG tablet, Take 1 tablet (750 mg total) by mouth 2 (two) times daily., Disp: 60 tablet, Rfl: 2 .  lovastatin (MEVACOR) 20 MG tablet, Take 20 mg by mouth at bedtime., Disp: , Rfl:  .  pantoprazole (PROTONIX) 20 MG tablet, Take 1 tablet (20 mg total) by mouth daily., Disp: 30 tablet, Rfl: 0 .  polyethylene glycol (MIRALAX / GLYCOLAX) packet, Take 17 g by mouth 2 (two) times daily as needed for mild constipation (give two doses in 24 hour period for mild constipation)., Disp: 60 each, Rfl: 0 .  senna-docusate (SENOKOT-S) 8.6-50 MG tablet, Take 2 tablets by mouth 2 (two) times daily., Disp: 100 tablet, Rfl: 0   Pain Inventory Average Pain 8 Pain Right Now 8 My pain is dull and aching  In the last 24 hours, has pain interfered with the following? General activity 8 Relation with others 8 Enjoyment of life 8 What TIME of day is your pain at its worst? . Sleep (in general) Fair  Pain is worse with: some activites Pain  improves with: rest, heat/ice and medication Relief from Meds: 8  Mobility walk without assistance ability to climb steps?  yes do you drive?  yes transfers alone Do you have any goals in this area?  no  Function not employed: date last employed .  Neuro/Psych No problems in this area  Prior Studies Any changes since last visit?  no  Physicians involved in your care Any changes since last visit?  no   No family history on file. Social History   Social History  . Marital status: Married    Spouse name: N/A  . Number of children: N/A  . Years of education: N/A   Social History Main Topics  . Smoking status: Current Every Day Smoker    Packs/day: 0.50  . Smokeless tobacco: Never Used  . Alcohol use No  . Drug use: No  . Sexual activity: Not on file   Other Topics Concern  . Not on file   Social History Narrative  . No narrative on file   Past Surgical History:  Procedure Laterality Date  . BREAST SURGERY     BX RT BREAST  BENIGN  . CRANIOTOMY N/A 01/04/2016   Procedure: Suboccipital Craniotomy and Cervical one Laminectomy for Clipping of Aneurysm;  Surgeon: Kevan Ny Ditty, MD;  Location: [facility];  Service: Neurosurgery;  Laterality: N/A;  . CRANIOTOMY N/A 07/09/2016   Procedure: Craniotomy for clipping of left middle cerebral artery aneurysm;  Surgeon: Kevan Ny Ditty, MD;  Location: [place] [facility];  Service: Neurosurgery;  Laterality: N/A;  Craniotomy for clipping of left middle cerebral artery aneurysm  . 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 Christella Noa;  Surgeon: Kevan Ny Ditty, MD;  Location: [place];  Service: Neurosurgery;  Laterality: Right;  . ELECTROPHYSIOLOGIC STUDY    . LAPAROSCOPIC REVISION VENTRICULAR-PERITONEAL (V-P) SHUNT N/A 02/04/2016   Procedure: LAPAROSCOPIC Insertion VENTRICULAR-PERITONEAL (V-P) SHUNT;  Surgeon: Rolm Bookbinder, MD;  Location: [place]  [facility];  Service: General;  Laterality: N/A;  . RADIOLOGY WITH ANESTHESIA N/A 01/03/2016   Procedure: RADIOLOGY WITH ANESTHESIA;  Surgeon: Medication Radiologist, MD;  Location: [place];  Service: Radiology;  Laterality: N/A;  . VENTRICULOPERITONEAL SHUNT Right 02/04/2016   Procedure: SHUNT INSERTION VENTRICULAR-PERITONEAL With Laparoscopic Assistance;  Surgeon: Kevan Ny Ditty, MD;  Location: [place] [facility];  Service: Neurosurgery;  Laterality: Right;   Past Medical History:  Diagnosis Date  . Arthritis   . Brain aneurysm   . Headache   . Hypertension    QUIT TAKING BP MEDS NONE IN 10+ YEARS  . 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 Cristopher Peru)  . Thyroid disease    PROCEDURE FOR THYROID 15 YRS AGO AT [facility]  . WPW (Wolff-Parkinson-White syndrome)    BP 129/80 (BP Location: Right Arm, Patient Position: Sitting, Cuff Size: Normal)   Pulse 60   SpO2 98%   Opioid Risk Score:   Fall Risk Score:  `1  Depression screen PHQ 2/9  No flowsheet data found. Review of Systems  Constitutional: Negative.   HENT: Negative.   Eyes: Negative.   Respiratory: Negative.   Cardiovascular: Negative.   Gastrointestinal: Negative.   Endocrine: Negative.   Genitourinary: Negative.   Musculoskeletal:       Arm pain  Skin: Negative.   Allergic/Immunologic: Negative.   Neurological: Negative.   Hematological: Negative.   Psychiatric/Behavioral: Negative.   All other systems reviewed and are negative.      Objective:   Physical Exam  Constitutional: She appears well-developed and well-nourished. NAD. HENT: scalp wound well healed.  Eyes: right lid ptosis still present Cardiovascular: RRR Respiratory: clear  GI: NT, bowel sounds present  Musculoskeletal: mild subacromial tenderness with IR/ER of the shoulder. +impingement sign. Has difficulty taking off her jacket due to discomfort.  Neurological: She is alert. Has  intact basic insight and awareness Left  neglect.  Right ptosis and left facial weakness stable. Speech slightly dysarthric.  Motor: 5/5 grossly RUE. 4+ LUE prox to distal. Bilateral LE grossly 4+ to 5/5. No sensory findings. Gait is slightly shuffling but stable. DTR's 1+.  Skin: Skin is warm and dry.  ge Psychiatric: pleasant and cooperative.      Assessment & Plan:  Medical Problem List and Plan: 1. 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              -continue [place] therapies---?outpt therapy at some point 2. RIght shoulder pain---likely mild RTC tendonitis  -continue with HH OT/PT and HEP  -exercises were provided today and reviewed  -consider injection if an ongoing problem.  3. Fluids/Electrolytes/Nutrition: encourage more water intake (as  opposed to juice)   -appetite has always been vigorous!!! 4. Seizure prophylaxis: On keppra. 5. Hypotension: has resolved   Follow up with Dr. Posey Pronto in about 2 months. Thirty minutes of face to face patient care time were spent during this visit. All questions were encouraged and answered.

## 2016-11-17 NOTE — Patient Instructions (Signed)

## 2016-12-08 ENCOUNTER — Other Ambulatory Visit: Payer: Self-pay

## 2016-12-08 NOTE — Telephone Encounter (Signed)
Received Rx request to refill Pantoprazole. Is it okay to refill this medication?  Please advise.

## 2016-12-08 NOTE — Telephone Encounter (Signed)
She should follow up with her PCP for refills regarding comorbid conditions.  Thanks

## 2016-12-09 NOTE — Telephone Encounter (Signed)
Electronic rejection with reason sent to pharmacy

## 2017-01-01 IMAGING — CR DG CHEST 1V PORT
1 series · 1 of 1 positions shown · non-contrast
Comparison: 01/06/2016

CLINICAL DATA: Hypoxemia

EXAM:
PORTABLE CHEST 1 VIEW

[AP]
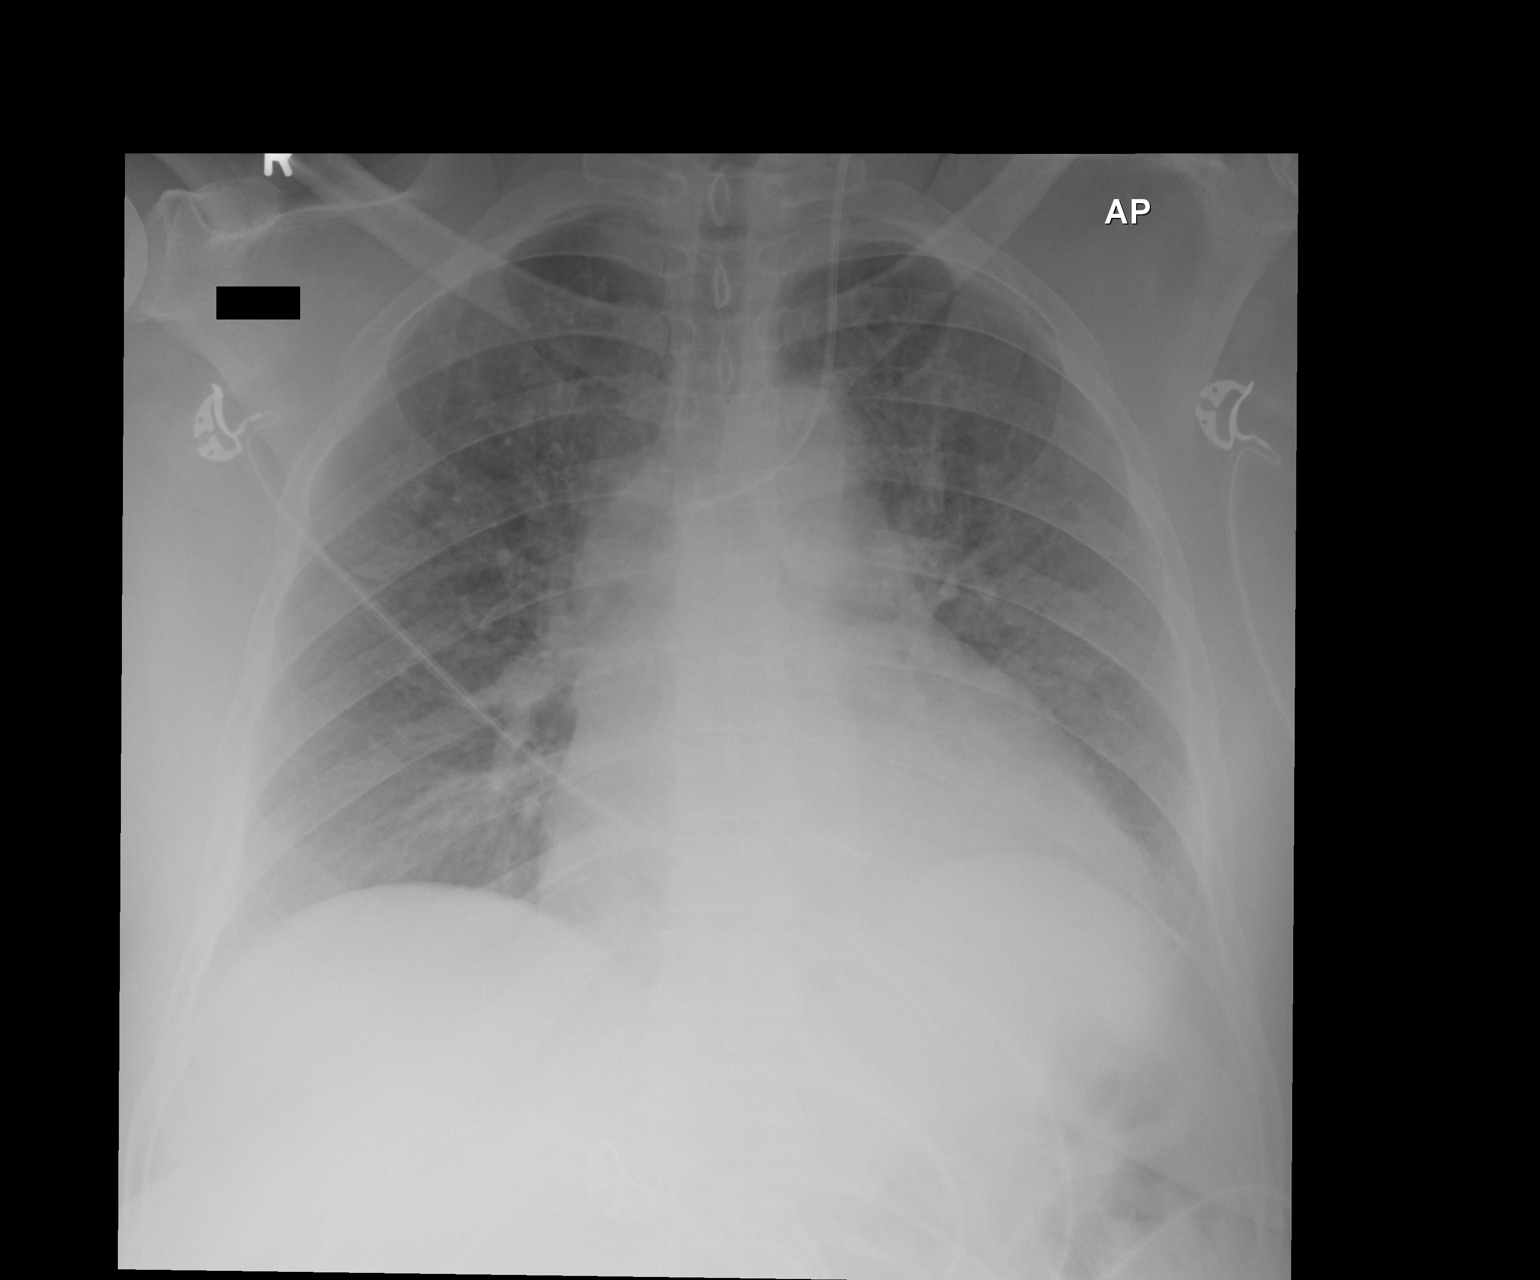

[1 of 1 positions shown; findings below may reference images not displayed]

FINDINGS: Left IJ catheter, tip at the left brachiocephalic vein.

Chronic cardiomegaly. Clearing of bilateral airspace disease, likely
pulmonary edema given the clinical circumstances. There is pulmonary
venous congestion persisting. No pleural effusion or pneumothorax.
IMPRESSION: 1. Pulmonary edema seen 4 days ago has improved.
2. Cardiomegaly and venous congestion.

## 2017-01-16 ENCOUNTER — Encounter: Payer: Self-pay | Admitting: Physical Medicine & Rehabilitation

## 2017-01-16 ENCOUNTER — Encounter: Payer: Medicaid Other | Attending: Physical Medicine & Rehabilitation | Admitting: Physical Medicine & Rehabilitation

## 2017-01-16 VITALS — BP 130/83 | HR 79 | Resp 14

## 2017-01-16 DIAGNOSIS — M25511 Pain in right shoulder: Secondary | ICD-10-CM | POA: Insufficient documentation

## 2017-01-16 DIAGNOSIS — G8929 Other chronic pain: Secondary | ICD-10-CM

## 2017-01-16 DIAGNOSIS — I6930 Unspecified sequelae of cerebral infarction: Secondary | ICD-10-CM | POA: Diagnosis not present

## 2017-01-16 DIAGNOSIS — Z8679 Personal history of other diseases of the circulatory system: Secondary | ICD-10-CM

## 2017-01-16 DIAGNOSIS — I609 Nontraumatic subarachnoid hemorrhage, unspecified: Secondary | ICD-10-CM | POA: Diagnosis present

## 2017-01-16 DIAGNOSIS — I608 Other nontraumatic subarachnoid hemorrhage: Secondary | ICD-10-CM

## 2017-01-16 DIAGNOSIS — I69319 Unspecified symptoms and signs involving cognitive functions following cerebral infarction: Secondary | ICD-10-CM | POA: Diagnosis not present

## 2017-01-16 DIAGNOSIS — M7581 Other shoulder lesions, right shoulder: Secondary | ICD-10-CM

## 2017-01-16 NOTE — Addendum Note (Signed)
Addended by: Delice Lesch A on: 01/16/2017 12:40 PM   Modules accepted: Orders

## 2017-01-16 NOTE — Progress Notes (Signed)
Subjective:    Patient ID: [name] [name], female    DOB: October 14, 1961, 56 y.o.   MRN: [id number]  HPI 56 y.o.femalewith history of SAH due to ruptured aneurysm 12/2015 WPW/PSVT, HA presents for follow up of right internal capsule infarct s/p clipping of B-MCA, L-PICA and basial tip aneurysms.  She was last seen in clinic on 11/27 by Dr. Naaman Plummer.  Since that time, she completed HH.  She is doing HEP at times. She states that her right should is about the same.  She saw Neurosurg and Keppra was d/ced.  She is with her mom, who provides supervision.  She is driving.  She sees her PCP on Monday.  Overall, she states she is doing well.    Pain Inventory Average Pain 6 Pain Right Now 0 My pain is intermittent and throbbing, pulsating  In the last 24 hours, has pain interfered with the following? General activity 7 Relation with others 3 Enjoyment of life 6 What TIME of day is your pain at its worst? night Sleep (in general) Fair  Pain is worse with: walking and some activites Pain improves with: therapy/exercise and muscle rub Relief from Meds: no selection  Mobility walk without assistance ability to climb steps?  yes do you drive?  yes  Function I need assistance with the following:  dressing and bathing  Neuro/Psych No problems in this area  Prior Studies Any changes since last visit?  no  Physicians involved in your care Any changes since last visit?  no   History reviewed. No pertinent family history. Social History   Social History  . Marital status: Married    Spouse name: N/A  . Number of children: N/A  . Years of education: N/A   Social History Main Topics  . Smoking status: Current Every Day Smoker    Packs/day: 0.50  . Smokeless tobacco: Never Used  . Alcohol use No  . Drug use: No  . Sexual activity: Not Asked   Other Topics Concern  . None   Social History Narrative  . None   Past Surgical History:  Procedure Laterality Date  . BREAST  SURGERY     BX RT BREAST  BENIGN  . CRANIOTOMY N/A 01/04/2016   Procedure: Suboccipital Craniotomy and Cervical one Laminectomy for Clipping of Aneurysm;  Surgeon: Kevan Ny Ditty, MD;  Location: [place] [facility];  Service: Neurosurgery;  Laterality: N/A;  . CRANIOTOMY N/A 07/09/2016   Procedure: Craniotomy for clipping of left middle cerebral artery aneurysm;  Surgeon: Kevan Ny Ditty, MD;  Location: [place] [facility];  Service: Neurosurgery;  Laterality: N/A;  Craniotomy for clipping of left middle cerebral artery aneurysm  . 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 Christella Noa;  Surgeon: Kevan Ny Ditty, MD;  Location: [place];  Service: Neurosurgery;  Laterality: Right;  . ELECTROPHYSIOLOGIC STUDY    . LAPAROSCOPIC REVISION VENTRICULAR-PERITONEAL (V-P) SHUNT N/A 02/04/2016   Procedure: LAPAROSCOPIC Insertion VENTRICULAR-PERITONEAL (V-P) SHUNT;  Surgeon: Rolm Bookbinder, MD;  Location: [place] [facility];  Service: General;  Laterality: N/A;  . RADIOLOGY WITH ANESTHESIA N/A 01/03/2016   Procedure: RADIOLOGY WITH ANESTHESIA;  Surgeon: Medication Radiologist, MD;  Location: [place];  Service: Radiology;  Laterality: N/A;  . VENTRICULOPERITONEAL SHUNT Right 02/04/2016   Procedure: SHUNT INSERTION VENTRICULAR-PERITONEAL With Laparoscopic Assistance;  Surgeon: Kevan Ny Ditty, MD;  Location: [place] [facility];  Service: Neurosurgery;  Laterality: Right;   Past Medical History:  Diagnosis  Date  . Arthritis   . Brain aneurysm   . Headache   . Hypertension    QUIT TAKING BP MEDS NONE IN 10+ YEARS  . 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 Cristopher Peru)  . Thyroid disease    PROCEDURE FOR THYROID 15 YRS AGO AT [facility]  . WPW (Wolff-Parkinson-White syndrome)    BP 130/83   Pulse 79   Resp 14   SpO2 95%   Opioid Risk Score:   Fall Risk Score:  `1  Depression  screen PHQ 2/9  No flowsheet data found.  Review of Systems  Constitutional: Negative.   HENT: Negative.   Eyes: Negative.   Respiratory: Negative.   Cardiovascular: Negative.   Gastrointestinal: Negative.   Endocrine: Negative.   Genitourinary: Negative.   Musculoskeletal: Positive for arthralgias (Right shoulder).  Skin: Negative.   Allergic/Immunologic: Negative.   Neurological: Negative.   Hematological: Negative.   Psychiatric/Behavioral: Negative.   All other systems reviewed and are negative.     Objective:   Physical Exam Constitutional: She appears well-developed and well-nourished. NAD. HENT: scalp wound well healed.  Eyes: right ptosis  Cardiovascular: RRR. No JVD.  Respiratory: clear. Unlabored.  GI: NT, bowel sounds present Musculoskeletal: Mild subacromial tenderness with IR/ER of the shoulder.   +impingement sign.   Has difficulty taking off her jacket due to discomfort.  Gait: Slow cadence Neurological: She is alert and oriented x3.  Left neglect appears to have resolved Right ptosis and left facial weakness  Motor: 4+-5/5 throughout Skin: Skin is warm and dry.  Intact Psychiatric: pleasant and cooperative  Assessment & Plan:  56 y.o.femalewithwith history of SAH due to ruptured aneurysm 12/2015 WPW/PSVT, HA presents for follow up of right internal capsule infarct s/p clipping of B-MCA, L-PICA and basial tip aneurysms.  1. 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  Notes reviewed from last visit  Follow up with Neurosurg  Referral for outpt PT  Do not recommend driving at this point  2. Persistent right shoulder pain  Will order xray  Tylenol PRN  Will schedule for steroid injection  3 H/o SVT/WPW s/p ablation:   Follow up with PCP  Pt states she does not see anybody  4. Renal function  AKI in hospital, follow up with PCP for routine lab work  5.  Tobacco abuse  Attempting to quit with vape

## 2017-01-22 ENCOUNTER — Encounter (HOSPITAL_COMMUNITY): Payer: Self-pay | Admitting: Speech Pathology

## 2017-01-22 ENCOUNTER — Ambulatory Visit (HOSPITAL_COMMUNITY): Payer: Medicaid Other | Attending: Physical Medicine & Rehabilitation | Admitting: Speech Pathology

## 2017-01-22 DIAGNOSIS — R41841 Cognitive communication deficit: Secondary | ICD-10-CM | POA: Diagnosis not present

## 2017-01-22 NOTE — Therapy (Signed)
[facility] [place], [place], [address] Phone: [phone]   Fax:  [phone]  Speech Language Pathology Evaluation  Patient Details  Name: [name] [name] MRN: [id number] Date of Birth: 03/20/1961 Referring Provider: Delice Lesch, MD  Encounter Date: 01/22/2017      End of Session - 01/22/17 1750    Visit Number 1   Number of Visits 1   Authorization Type Medicaid   SLP Start Time [id number]   SLP Stop Time  [id number]   SLP Time Calculation (min) 46 min   Activity Tolerance Patient tolerated treatment well      Past Medical History:  Diagnosis Date  . Arthritis   . Brain aneurysm   . Headache   . Hypertension    QUIT TAKING BP MEDS NONE IN 10+ YEARS  . 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 Cristopher Peru)  . Thyroid disease    PROCEDURE FOR THYROID 15 YRS AGO AT [facility]  . WPW (Wolff-Parkinson-White syndrome)     Past Surgical History:  Procedure Laterality Date  . BREAST SURGERY     BX RT BREAST  BENIGN  . CRANIOTOMY N/A 01/04/2016   Procedure: Suboccipital Craniotomy and Cervical one Laminectomy for Clipping of Aneurysm;  Surgeon: Kevan Ny Ditty, MD;  Location: [facility];  Service: Neurosurgery;  Laterality: N/A;  . CRANIOTOMY N/A 07/09/2016   Procedure: Craniotomy for clipping of left middle cerebral artery aneurysm;  Surgeon: Kevan Ny Ditty, MD;  Location: [facility];  Service: Neurosurgery;  Laterality: N/A;  Craniotomy for clipping of left middle cerebral artery aneurysm  . 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 Christella Noa;  Surgeon: Kevan Ny Ditty, MD;  Location: [facility];  Service: Neurosurgery;  Laterality: Right;  . ELECTROPHYSIOLOGIC STUDY    . LAPAROSCOPIC REVISION VENTRICULAR-PERITONEAL (V-P) SHUNT N/A 02/04/2016   Procedure: LAPAROSCOPIC Insertion  VENTRICULAR-PERITONEAL (V-P) SHUNT;  Surgeon: Rolm Bookbinder, MD;  Location: [facility];  Service: General;  Laterality: N/A;  . RADIOLOGY WITH ANESTHESIA N/A 01/03/2016   Procedure: RADIOLOGY WITH ANESTHESIA;  Surgeon: Medication Radiologist, MD;  Location: [facility];  Service: Radiology;  Laterality: N/A;  . VENTRICULOPERITONEAL SHUNT Right 02/04/2016   Procedure: SHUNT INSERTION VENTRICULAR-PERITONEAL With Laparoscopic Assistance;  Surgeon: Kevan Ny Ditty, MD;  Location: [facility];  Service: Neurosurgery;  Laterality: Right;    There were no vitals filed for this visit.      Subjective Assessment - 01/22/17 1731    Subjective "I don't think I even need to be here."   Special Tests MOCA   Currently in Pain? No/denies            SLP Evaluation [facility] - 01/22/17 1731      SLP Visit Information   SLP Received On 01/22/17   Referring Provider Delice Lesch, MD   Onset Date 10/26/2016   Medical Diagnosis s/p right internal capsule infarct     Subjective   Subjective "I don't think I need to be here."   Patient/Family Stated Goal None     General Information   HPI [name] [name] a 56 y.o.femalewith history of SAH due to ruptured aneurysm 12/2015 who was admitted to [facility] on 10/[facility]16 for clipping of B-MCA, L-PICA and basal tip aneurysms by Dr. Cyndy Freeze. History taken from chart review. Post op developed left sided weakness and CT head done revealing  right internal capsule infarct with right frontal and anterior temporal edema likely due to post op swelling and stable post op blood products. Pt admitted to [facility] on 10/29/2016- 11/08/2016. She was discharged home and received [facility] services. She is referred for outpatient SLE and treatment by Dr. Delice Lesch who notes continued cognitive deficits.    Behavioral/Cognition Alert and cooperative   Mobility Status ambulatory     Prior Functional Status   Cognitive/Linguistic Baseline Baseline deficits   Baseline deficit details short  term memory deficits   Type of Home House    Lives With --  lives with mother and grandson   Available Support Family   Education 11th grade   Vocation On disability  previously worked for a [employer]   Overall Cognitive Status Impaired/Different from baseline   Area of Impairment Memory   Memory Decreased short-term memory   Memory Comments 0/5 independent recall; 5/5 with cues   Memory Impaired   Memory Impairment Retrieval deficit   Awareness Appears intact   Problem Solving Appears intact     Auditory Comprehension   Overall Auditory Comprehension Appears within functional limits for tasks assessed   Yes/No Questions Within Functional Limits   Commands Within Functional Limits   Conversation Complex   Interfering Components Working memory   EffectiveTechniques Repetition     Psychologist, counselling Within Function Limits     Reading Comprehension   Reading Status Not tested     Expression   Primary Mode of Expression Verbal     Verbal Expression   Overall Verbal Expression Appears within functional limits for tasks assessed   Initiation No impairment   Level of Generative/Spontaneous Verbalization Conversation   Repetition No impairment   Naming No impairment   Pragmatics No impairment   Non-Verbal Means of Communication Not applicable     Written Expression   Dominant Hand Right   Written Expression Not tested     Oral Motor/Sensory Function   Overall Oral Motor/Sensory Function Impaired   Labial ROM Reduced left   Labial Symmetry Abnormal symmetry left   Labial Strength Reduced Left   Labial Sensation Within Functional Limits   Lingual ROM Within Functional Limits   Lingual Symmetry Within Functional Limits   Lingual Strength Within Functional Limits   Lingual Sensation Within Functional Limits   Lingual Coordination WFL   Facial ROM Within Functional Limits   Mandible Within Functional Limits   Overall Oral  Motor/Sensory Function residual left buccal weakness     Motor Speech   Overall Motor Speech Appears within functional limits for tasks assessed   Respiration Within functional limits   Phonation Normal   Resonance Within functional limits   Articulation Impaired   Level of Impairment Conversation   Intelligibility Intelligibility reduced   Word 75-100% accurate   Phrase 75-100% accurate   Sentence 75-100% accurate   Conversation 75-100% accurate   Motor Planning Witnin functional limits   Motor Speech Errors Not applicable   Effective Techniques Over-articulate   Phonation WFL     Standardized Assessments   Standardized Assessments  Montreal Cognitive Assessment (MOCA)   Montreal Cognitive Assessment (MOCA)  19/30           SLP Education - 01/22/17 1748    Education provided Yes   Education Details Evaluation reviewed with pt and recommended that pt purchase a small monthly planner at a dollar store to carry in purse   Person(s) Educated Patient  Methods Explanation   Comprehension Verbalized understanding          Plan - 01/22/17 1750    Clinical Impression Statement Pt presents with mild cognitive linguistic deficits characterized by decreased working memory which is aided by compensatory strategies. Speech, language, problem solving, and reasoning skills appear within normal limits. Although she scored poorly on the [facility] (19/30), suspect baseline performance would be similar except for 5-item recall task. Pt expressed that she understands that she has "some" short term memory difficulties, but is generally able to compensate well. She lives with her mother and grandson (age 14) and is the primary caregiver. She verbalized that she did "get mixed up" on where her appointment was in [place] with the neurologist because she had previously been seen in a different office. Pt arrived to today's appointment on time and demonstrated adequate planning and prospective memory  skills to have a plan for someone else to pick up her grandson from school today in case this appointment ran late. Pt was encouraged to keep a calendar and/or "to do" list for day to day appointments and activities. She keeps a calendar at home, but she was also encouraged to go to a dollar store and purchase a pocket sized calendar for her purse. Pt offered short term SLP therapy to address memory impairment, however pt feels this is unnecessary and she is "doing fine". Pt did have SLP intervention in acute setting and with home health. Pt is currently satisfied with current level of function; no SLP intervention at this time.    Consulted and Agree with Plan of Care Patient      Patient will benefit from skilled therapeutic intervention in order to improve the following deficits and impairments:   Cognitive communication deficit    Problem List Patient Active Problem List   Diagnosis Date Noted  . Left hemiparesis ([place]) 11/17/2016  . Idiopathic hypotension   . Cognitive deficit, post-stroke   . CVA (cerebral vascular accident) ([place]) 10/29/2016  . Stroke due to embolism of middle cerebral artery ([place]) 10/29/2016  . Seizure prophylaxis   . History of supraventricular tachycardia   . Neurologic abnormality   . Stroke syndrome ([place])   . Vascular headache   . Other secondary hypertension   . Hypokalemia   . AKI (acute kidney injury) ([place])   . Lymphocytosis   . Lethargy   . Brain edema ([place])   . Intracranial aneurysm 07/09/2016  . Communicating hydrocephalus 02/03/2016  . Essential hypertension   . Paroxysmal SVT (supraventricular tachycardia) ([place])   . Paroxysmal atrial fibrillation (HCC)   . Tobacco abuse   . Tachypnea   . Bradycardia   . Leukocytosis   . Acute blood loss anemia   . Thrombocytosis ([employer])   . Chest pain   . Nontraumatic subarachnoid hemorrhage ([place])   . Acute respiratory failure ([place])   . Encounter for central line placement   . Hypoxemia   . Renal  insufficiency   . Encounter for imaging study to confirm nasogastric (NG) tube placement   . Endotracheally intubated   . Respiratory failure ([place])   . SAH (subarachnoid hemorrhage) ([place])   . Subarachnoid hemorrhage due to ruptured aneurysm ([place]) 01/03/2016  . Subarachnoid hemorrhage ([place]) 01/03/2016  . Atrial fibrillation with rapid ventricular response ([place])   . Elevated lactic acid level   . SMOKER 03/20/2009  . HYPERTENSION, UNSPECIFIED 03/20/2009  . WPW 03/20/2009  . SVT/ PSVT/ PAT 03/20/2009  . BRONCHITIS 03/20/2009   Thank you,  Genene Churn, [place]  [place] 01/22/2017, 5:55 PM  [facility] [address] [place], [place], [address] Phone: [phone]   Fax:  [phone]  Name: [name] [name] MRN: [id number] Date of Birth: 16-Aug-1961

## 2017-02-05 ENCOUNTER — Encounter: Payer: Medicaid Other | Admitting: Physical Medicine & Rehabilitation

## 2017-03-19 ENCOUNTER — Emergency Department (HOSPITAL_COMMUNITY)
Admission: EM | Admit: 2017-03-19 | Discharge: 2017-03-19 | Disposition: A | Discharge status: Home / Self Care | Payer: Medicaid Other | Attending: Emergency Medicine | Admitting: Emergency Medicine

## 2017-03-19 ENCOUNTER — Emergency Department (HOSPITAL_COMMUNITY): Payer: Medicaid Other

## 2017-03-19 ENCOUNTER — Encounter (HOSPITAL_COMMUNITY): Payer: Self-pay

## 2017-03-19 DIAGNOSIS — G8929 Other chronic pain: Secondary | ICD-10-CM | POA: Insufficient documentation

## 2017-03-19 DIAGNOSIS — M25511 Pain in right shoulder: Secondary | ICD-10-CM | POA: Diagnosis present

## 2017-03-19 DIAGNOSIS — I10 Essential (primary) hypertension: Secondary | ICD-10-CM | POA: Diagnosis not present

## 2017-03-19 DIAGNOSIS — Z87891 Personal history of nicotine dependence: Secondary | ICD-10-CM | POA: Insufficient documentation

## 2017-03-19 DIAGNOSIS — Z79899 Other long term (current) drug therapy: Secondary | ICD-10-CM | POA: Diagnosis not present

## 2017-03-19 MED ORDER — PREDNISONE 10 MG PO TABS: ORAL_TABLET | ORAL | 0 refills | Status: DC

## 2017-03-19 MED ORDER — PREDNISONE 50 MG PO TABS
60.0000 mg | ORAL_TABLET | Freq: Once | ORAL | Status: AC
  Administered 2017-03-19: 60 mg via ORAL
  Filled 2017-03-19: qty 1

## 2017-03-19 MED ORDER — TRAMADOL HCL 50 MG PO TABS: 50.0000 mg | ORAL_TABLET | Freq: Four times a day (QID) | ORAL | 0 refills | Status: DC | PRN

## 2017-03-19 MED ORDER — PREDNISONE 50 MG PO TABS
ORAL_TABLET | ORAL | Status: AC
  Filled 2017-03-19: qty 1

## 2017-03-19 NOTE — ED Notes (Signed)
Patient transported to X-ray 

## 2017-03-19 NOTE — ED Triage Notes (Signed)
Pt reports pain in right shoulder for 2 days. States pain has gotten worse. Has been taking tylenol with no relief

## 2017-03-19 NOTE — ED Notes (Signed)
Pt verbalized understanding of no driving and to use caution within 4 hours of taking pain meds due to meds cause drowsiness 

## 2017-03-19 NOTE — ED Notes (Signed)
Per pt, pt called Dr. Ruthe Mannan office for an appt but the office never called her back.  Pt seen for same in October.

## 2017-03-19 NOTE — Discharge Instructions (Signed)
Apply an ice pack to your shoulder as much as is comfortable for the next 2 days.  You may add a heating pad starting in 2 days - 20 minutes 3 times daily.  You may take the tramadol prescribed for pain relief.  This will make you drowsy - do not drive within 4 hours of taking this medication.
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
## 2017-04-02 ENCOUNTER — Ambulatory Visit: Payer: Medicaid Other | Admitting: Physical Medicine & Rehabilitation

## 2017-07-01 ENCOUNTER — Other Ambulatory Visit (HOSPITAL_COMMUNITY): Payer: Self-pay | Admitting: Physician Assistant

## 2017-07-01 DIAGNOSIS — I671 Cerebral aneurysm, nonruptured: Secondary | ICD-10-CM

## 2017-07-07 ENCOUNTER — Ambulatory Visit (HOSPITAL_COMMUNITY)
Admission: RE | Admit: 2017-07-07 | Discharge: 2017-07-07 | Disposition: A | Discharge status: Home / Self Care | Payer: Medicaid Other | Source: Ambulatory Visit | Attending: Physician Assistant | Admitting: Physician Assistant

## 2017-07-07 ENCOUNTER — Encounter (HOSPITAL_COMMUNITY): Payer: Self-pay

## 2017-07-07 DIAGNOSIS — I671 Cerebral aneurysm, nonruptured: Secondary | ICD-10-CM | POA: Insufficient documentation

## 2017-07-07 MED ORDER — IOPAMIDOL (ISOVUE-370) INJECTION 76%
50.0000 mL | Freq: Once | INTRAVENOUS | Status: AC | PRN
  Administered 2017-07-07: 50 mL via INTRAVENOUS

## 2017-07-07 MED ORDER — IOPAMIDOL (ISOVUE-370) INJECTION 76%
INTRAVENOUS | Status: AC
  Filled 2017-07-07: qty 100

## 2017-07-21 ENCOUNTER — Emergency Department (HOSPITAL_COMMUNITY): Payer: Medicaid Other

## 2017-07-21 ENCOUNTER — Encounter (HOSPITAL_COMMUNITY): Payer: Self-pay | Admitting: Emergency Medicine

## 2017-07-21 ENCOUNTER — Emergency Department (HOSPITAL_COMMUNITY)
Admission: EM | Admit: 2017-07-21 | Discharge: 2017-07-21 | Disposition: A | Discharge status: Home / Self Care | Payer: Medicaid Other | Attending: Emergency Medicine | Admitting: Emergency Medicine

## 2017-07-21 DIAGNOSIS — Z79899 Other long term (current) drug therapy: Secondary | ICD-10-CM | POA: Diagnosis not present

## 2017-07-21 DIAGNOSIS — I10 Essential (primary) hypertension: Secondary | ICD-10-CM | POA: Diagnosis not present

## 2017-07-21 DIAGNOSIS — R079 Chest pain, unspecified: Secondary | ICD-10-CM | POA: Diagnosis present

## 2017-07-21 DIAGNOSIS — Z87891 Personal history of nicotine dependence: Secondary | ICD-10-CM | POA: Insufficient documentation

## 2017-07-21 DIAGNOSIS — J9801 Acute bronchospasm: Secondary | ICD-10-CM

## 2017-07-21 LAB — CBC WITH DIFFERENTIAL/PLATELET
Basophils Absolute: 0.1 K/uL (ref 0.0–0.1)
Basophils Relative: 1 %
Eosinophils Absolute: 0.1 K/uL (ref 0.0–0.7)
Eosinophils Relative: 1 %
HCT: 41.4 % (ref 36.0–46.0)
Hemoglobin: 13.1 g/dL (ref 12.0–15.0)
Lymphocytes Relative: 32 %
Lymphs Abs: 2.2 K/uL (ref 0.7–4.0)
MCH: 27.2 pg (ref 26.0–34.0)
MCHC: 31.6 g/dL (ref 30.0–36.0)
MCV: 86.1 fL (ref 78.0–100.0)
Monocytes Absolute: 0.8 K/uL (ref 0.1–1.0)
Monocytes Relative: 12 %
Neutro Abs: 3.8 K/uL (ref 1.7–7.7)
Neutrophils Relative %: 54 %
Platelets: 271 K/uL (ref 150–400)
RBC: 4.81 MIL/uL (ref 3.87–5.11)
RDW: 14.9 % (ref 11.5–15.5)
WBC: 6.9 K/uL (ref 4.0–10.5)

## 2017-07-21 LAB — COMPREHENSIVE METABOLIC PANEL WITH GFR
ALT: 22 U/L (ref 14–54)
AST: 19 U/L (ref 15–41)
Albumin: 3.8 g/dL (ref 3.5–5.0)
Alkaline Phosphatase: 72 U/L (ref 38–126)
Anion gap: 7 (ref 5–15)
BUN: 12 mg/dL (ref 6–20)
CO2: 25 mmol/L (ref 22–32)
Calcium: 9.6 mg/dL (ref 8.9–10.3)
Chloride: 112 mmol/L — ABNORMAL HIGH (ref 101–111)
Creatinine, Ser: 1.26 mg/dL — ABNORMAL HIGH (ref 0.44–1.00)
GFR calc Af Amer: 54 mL/min — ABNORMAL LOW (ref >60)
GFR calc non Af Amer: 47 mL/min — ABNORMAL LOW (ref >60)
Glucose, Bld: 84 mg/dL (ref 65–99)
Potassium: 4 mmol/L (ref 3.5–5.1)
Sodium: 144 mmol/L (ref 135–145)
Total Bilirubin: 0.6 mg/dL (ref 0.3–1.2)
Total Protein: 7.9 g/dL (ref 6.5–8.1)

## 2017-07-21 LAB — TROPONIN I: Troponin I: <0.03 ng/mL (ref <0.03)

## 2017-07-21 MED ORDER — IPRATROPIUM-ALBUTEROL 0.5-2.5 (3) MG/3ML IN SOLN
3.0000 mL | Freq: Once | RESPIRATORY_TRACT | Status: DC
  Filled 2017-07-21: qty 3

## 2017-07-21 MED ORDER — IPRATROPIUM-ALBUTEROL 0.5-2.5 (3) MG/3ML IN SOLN
3.0000 mL | Freq: Once | RESPIRATORY_TRACT | Status: AC
  Administered 2017-07-21: 3 mL via RESPIRATORY_TRACT

## 2017-07-21 MED ORDER — ALBUTEROL SULFATE HFA 108 (90 BASE) MCG/ACT IN AERS
2.0000 | INHALATION_SPRAY | RESPIRATORY_TRACT | Status: DC | PRN
  Administered 2017-07-21: 2 via RESPIRATORY_TRACT
  Filled 2017-07-21: qty 6.7

## 2017-07-21 MED ORDER — OXYCODONE-ACETAMINOPHEN 5-325 MG PO TABS
1.0000 | ORAL_TABLET | Freq: Once | ORAL | Status: AC
  Administered 2017-07-21: 1 via ORAL
  Filled 2017-07-21: qty 1

## 2017-07-21 NOTE — ED Triage Notes (Signed)
Pt c/o sudden sharp pains to chest and sob x 1 hour ago. Denies n/v/d/diziness. Mild labored breathing noted. Sclera red. States has stopped smoking x 1 month ago and started smoking again. C/o nonprod cough.

## 2017-07-21 NOTE — Discharge Instructions (Signed)
Follow up with your md as planned this week

## 2017-07-21 NOTE — ED Provider Notes (Signed)
[facility] DEPT Provider Note   CSN: [id number] Arrival date & time: 07/21/17  1712     History   Chief Complaint Chief Complaint  Patient presents with  . Shortness of Breath  . Chest Pain    HPI [name] [name] is a 56 y.o. female.  Patient states that she smoked a cigarette and got short of breath but hasn't chest discomfort. She states she hasn't smoked a long time   The history is provided by the patient.  Shortness of Breath  This is a new problem. The problem occurs rarely.The current episode started less than 1 hour ago. The problem has been resolved. Associated symptoms include chest pain. Pertinent negatives include no fever, no headaches, no cough, no abdominal pain and no rash. The problem's precipitants include smoke. She has tried nothing for the symptoms. The treatment provided no relief.  Chest Pain   Associated symptoms include shortness of breath. Pertinent negatives include no abdominal pain, no back pain, no cough, no fever and no headaches.  Pertinent negatives for past medical history include no seizures.    Past Medical History:  Diagnosis Date  . Arthritis   . Brain aneurysm   . Headache   . Hypertension    QUIT TAKING BP MEDS NONE IN 10+ YEARS  . 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 Cristopher Peru)  . Thyroid disease    PROCEDURE FOR THYROID 15 YRS AGO AT [facility]  . WPW (Wolff-Parkinson-White syndrome)     Patient Active Problem List   Diagnosis Date Noted  . Left hemiparesis ([place]) 11/17/2016  . Idiopathic hypotension   . Cognitive deficit, post-stroke   . CVA (cerebral vascular accident) ([place]) 10/29/2016  . Stroke due to embolism of middle cerebral artery ([place]) 10/29/2016  . Seizure prophylaxis   . History of supraventricular tachycardia   . Neurologic abnormality   . Stroke syndrome ([place])   . Vascular headache   . Other secondary hypertension   . Hypokalemia   . AKI  (acute kidney injury) ([place])   . Lymphocytosis   . Lethargy   . Brain edema ([place])   . Intracranial aneurysm 07/09/2016  . Communicating hydrocephalus 02/03/2016  . Essential hypertension   . Paroxysmal SVT (supraventricular tachycardia) ([place])   . Paroxysmal atrial fibrillation (HCC)   . Tobacco abuse   . Tachypnea   . Bradycardia   . Leukocytosis   . Acute blood loss anemia   . Thrombocytosis ([place])   . Chest pain   . Nontraumatic subarachnoid hemorrhage ([place])   . Acute respiratory failure ([place])   . Encounter for central line placement   . Hypoxemia   . Renal insufficiency   . Encounter for imaging study to confirm nasogastric (NG) tube placement   . Endotracheally intubated   . Respiratory failure ([place])   . SAH (subarachnoid hemorrhage) ([place])   . Subarachnoid hemorrhage due to ruptured aneurysm ([place]) 01/03/2016  . Subarachnoid hemorrhage ([place]) 01/03/2016  . Atrial fibrillation with rapid ventricular response ([place])   . Elevated lactic acid level   . SMOKER 03/20/2009  . HYPERTENSION, UNSPECIFIED 03/20/2009  . WPW 03/20/2009  . SVT/ PSVT/ PAT 03/20/2009  . BRONCHITIS 03/20/2009    Past Surgical History:  Procedure Laterality Date  . BREAST SURGERY     BX RT BREAST  BENIGN  . CRANIOTOMY N/A 01/04/2016   Procedure: Suboccipital Craniotomy and Cervical one Laminectomy for Clipping of Aneurysm;  Surgeon: Kevan Ny Ditty,  MD;  Location: [facility];  Service: Neurosurgery;  Laterality: N/A;  . CRANIOTOMY N/A 07/09/2016   Procedure: Craniotomy for clipping of left middle cerebral artery aneurysm;  Surgeon: Kevan Ny Ditty, MD;  Location: [facility];  Service: Neurosurgery;  Laterality: N/A;  Craniotomy for clipping of left middle cerebral artery aneurysm  . 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 Christella Noa;  Surgeon: Kevan Ny Ditty, MD;  Location: [facility];  Service: Neurosurgery;   Laterality: Right;  . ELECTROPHYSIOLOGIC STUDY    . LAPAROSCOPIC REVISION VENTRICULAR-PERITONEAL (V-P) SHUNT N/A 02/04/2016   Procedure: LAPAROSCOPIC Insertion VENTRICULAR-PERITONEAL (V-P) SHUNT;  Surgeon: Rolm Bookbinder, MD;  Location: [facility];  Service: General;  Laterality: N/A;  . RADIOLOGY WITH ANESTHESIA N/A 01/03/2016   Procedure: RADIOLOGY WITH ANESTHESIA;  Surgeon: Medication Radiologist, MD;  Location: [facility];  Service: Radiology;  Laterality: N/A;  . VENTRICULOPERITONEAL SHUNT Right 02/04/2016   Procedure: SHUNT INSERTION VENTRICULAR-PERITONEAL With Laparoscopic Assistance;  Surgeon: Kevan Ny Ditty, MD;  Location: [facility];  Service: Neurosurgery;  Laterality: Right;    OB History    No data available       Home Medications    Prior to Admission medications   Medication Sig Start Date End Date Taking? Authorizing Provider  acetaminophen (TYLENOL) 500 MG tablet Take 1,000 mg by mouth daily as needed for moderate pain or headache.   Yes [provider]  amLODipine (NORVASC) 5 MG tablet Take 5 mg by mouth daily.   Yes [provider]  rosuvastatin (CRESTOR) 20 MG tablet Take 20 mg by mouth daily.   Yes [provider]    Family History History reviewed. No pertinent family history.  Social History Social History  Substance Use Topics  . Smoking status: Former Smoker    Packs/day: 0.50    Quit date: 10/22/2016  . Smokeless tobacco: Never Used  . Alcohol use No     Allergies   No known allergies   Review of Systems Review of Systems  Constitutional: Negative for appetite change, fatigue and fever.  HENT: Negative for congestion, ear discharge and sinus pressure.   Eyes: Negative for discharge.  Respiratory: Positive for shortness of breath. Negative for cough.   Cardiovascular: Positive for chest pain.  Gastrointestinal: Negative for abdominal pain and diarrhea.  Genitourinary: Negative for frequency and hematuria.    Musculoskeletal: Negative for back pain.  Skin: Negative for rash.  Neurological: Negative for seizures and headaches.  Psychiatric/Behavioral: Negative for hallucinations.     Physical Exam Updated Vital Signs BP 110/66   Pulse 73   Temp 98.3 F (36.8 C) (Oral)   Resp 16   Ht 5\' 3"  (1.6 m)   Wt 106.1 kg (234 lb)   SpO2 100%   BMI 41.45 kg/m   Physical Exam  Constitutional: She is oriented to person, place, and time. She appears well-developed.  HENT:  Head: Normocephalic.  Eyes: Conjunctivae and EOM are normal. No scleral icterus.  Neck: Neck supple. No thyromegaly present.  Cardiovascular: Normal rate and regular rhythm.  Exam reveals no gallop and no friction rub.   No murmur heard. Pulmonary/Chest: No stridor. She has wheezes. She has no rales. She exhibits no tenderness.  Abdominal: She exhibits no distension. There is no tenderness. There is no rebound.  Musculoskeletal: Normal range of motion. She exhibits no edema.  Lymphadenopathy:    She has no cervical adenopathy.  Neurological: She is  oriented to person, place, and time. She exhibits normal muscle tone. Coordination normal.  Skin: No rash noted. No erythema.  Psychiatric: She has a normal mood and affect. Her behavior is normal.     ED Treatments / Results  Labs (all labs ordered are listed, but only abnormal results are displayed) Labs Reviewed  COMPREHENSIVE METABOLIC PANEL - Abnormal; Notable for the following:       Result Value   Chloride 112 (*)    Creatinine, Ser 1.26 (*)    GFR calc non Af Amer 47 (*)    GFR calc Af Amer 54 (*)    All other components within normal limits  CBC WITH DIFFERENTIAL/PLATELET  TROPONIN I    EKG  EKG Interpretation  Date/Time:  Tuesday July 21 2017 17:20:13 EDT Ventricular Rate:  79 PR Interval:    QRS Duration: 91 QT Interval:  377 QTC Calculation: 433 R Axis:   84 Text Interpretation:  Sinus rhythm Low voltage, precordial leads Borderline repolarization  abnormality Confirmed by Milton Ferguson [phone]) on 07/21/2017 7:26:25 PM Also confirmed by Milton Ferguson [phone])  on 07/21/2017 7:27:32 PM       Radiology Dg Chest 2 View  Result Date: 07/21/2017 CLINICAL DATA:  Short of breath.  Chest pain. EXAM: CHEST  2 VIEW COMPARISON:  10/30/2016 FINDINGS: The heart is upper normal in size. VP shunt extends across the right neck and chest. Clear lungs. No pneumothorax or pleural effusion. IMPRESSION: No active cardiopulmonary disease. Electronically Signed   By: Marybelle Killings M.D.   On: 07/21/2017 18:12    Procedures Procedures (including critical care time)  Medications Ordered in ED Medications  albuterol (PROVENTIL HFA;VENTOLIN HFA) 108 (90 Base) MCG/ACT inhaler 2 puff (not administered)  ipratropium-albuterol (DUONEB) 0.5-2.5 (3) MG/3ML nebulizer solution 3 mL (3 mLs Nebulization Given 07/21/17 1803)  oxyCODONE-acetaminophen (PERCOCET/ROXICET) 5-325 MG per tablet 1 tablet (1 tablet Oral Given 07/21/17 1749)     Initial Impression / Assessment and Plan / ED Course  I have reviewed the triage vital signs and the nursing notes.  Pertinent labs & imaging results that were available during my care of the patient were reviewed by me and considered in my medical decision making (see chart for details).    Patient improved with neb treatment. She'll be sent home with an inhaler and his follow-up with her doctor in a couple days  Final Clinical Impressions(s) / ED Diagnoses   Final diagnoses:  Bronchospasm    New Prescriptions New Prescriptions   No medications on file     Milton Ferguson, MD 07/21/17 1934

## 2018-03-14 ENCOUNTER — Emergency Department (HOSPITAL_COMMUNITY): Payer: Medicaid Other

## 2018-03-14 ENCOUNTER — Emergency Department (HOSPITAL_COMMUNITY)
Admission: EM | Admit: 2018-03-14 | Discharge: 2018-03-14 | Disposition: A | Discharge status: Home / Self Care | Payer: Medicaid Other | Attending: Emergency Medicine | Admitting: Emergency Medicine

## 2018-03-14 ENCOUNTER — Other Ambulatory Visit: Payer: Self-pay

## 2018-03-14 ENCOUNTER — Encounter (HOSPITAL_COMMUNITY): Payer: Self-pay | Admitting: *Deleted

## 2018-03-14 DIAGNOSIS — M19012 Primary osteoarthritis, left shoulder: Secondary | ICD-10-CM | POA: Insufficient documentation

## 2018-03-14 DIAGNOSIS — M19019 Primary osteoarthritis, unspecified shoulder: Secondary | ICD-10-CM

## 2018-03-14 DIAGNOSIS — I456 Pre-excitation syndrome: Secondary | ICD-10-CM | POA: Diagnosis not present

## 2018-03-14 DIAGNOSIS — M549 Dorsalgia, unspecified: Secondary | ICD-10-CM | POA: Diagnosis present

## 2018-03-14 DIAGNOSIS — M62830 Muscle spasm of back: Secondary | ICD-10-CM | POA: Insufficient documentation

## 2018-03-14 DIAGNOSIS — I10 Essential (primary) hypertension: Secondary | ICD-10-CM | POA: Diagnosis not present

## 2018-03-14 DIAGNOSIS — M62838 Other muscle spasm: Secondary | ICD-10-CM

## 2018-03-14 DIAGNOSIS — F17200 Nicotine dependence, unspecified, uncomplicated: Secondary | ICD-10-CM | POA: Insufficient documentation

## 2018-03-14 DIAGNOSIS — Z79899 Other long term (current) drug therapy: Secondary | ICD-10-CM | POA: Insufficient documentation

## 2018-03-14 DIAGNOSIS — Z8673 Personal history of transient ischemic attack (TIA), and cerebral infarction without residual deficits: Secondary | ICD-10-CM | POA: Insufficient documentation

## 2018-03-14 MED ORDER — KETOROLAC TROMETHAMINE 30 MG/ML IJ SOLN
30.0000 mg | Freq: Once | INTRAMUSCULAR | Status: AC
  Administered 2018-03-14: 30 mg via INTRAMUSCULAR
  Filled 2018-03-14: qty 1

## 2018-03-14 MED ORDER — HYDROCODONE-ACETAMINOPHEN 5-325 MG PO TABS: 1.0000 | ORAL_TABLET | ORAL | 0 refills | Status: AC | PRN

## 2018-03-14 MED ORDER — DIAZEPAM 5 MG PO TABS: 5.0000 mg | ORAL_TABLET | Freq: Two times a day (BID) | ORAL | 0 refills | Status: AC

## 2018-03-14 NOTE — ED Provider Notes (Signed)
[facility] EMERGENCY DEPARTMENT Provider Note   CSN: [id number] Arrival date & time: 03/14/18  1941     History   Chief Complaint Chief Complaint  Patient presents with  . Back Pain    HPI [name] [name] is a 57 y.o. female.  Pt presents to the ED today for back pain and left shoulder pain.  Pt said left shoulder has been hurting for a few months.  She has an appt with Dr. Aline Brochure for her shoulder on Wed., the 27th.  She denies n/v or numbness or tingling.     Past Medical History:  Diagnosis Date  . Arthritis   . Brain aneurysm   . Headache   . Hypertension    QUIT TAKING BP MEDS NONE IN 10+ YEARS  . 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 Cristopher Peru)  . Thyroid disease    PROCEDURE FOR THYROID 15 YRS AGO AT [facility]  . WPW (Wolff-Parkinson-White syndrome)     Patient Active Problem List   Diagnosis Date Noted  . Left hemiparesis ([place]) 11/17/2016  . Idiopathic hypotension   . Cognitive deficit, post-stroke   . CVA (cerebral vascular accident) ([place]) 10/29/2016  . Stroke due to embolism of middle cerebral artery ([place]) 10/29/2016  . Seizure prophylaxis   . History of supraventricular tachycardia   . Neurologic abnormality   . Stroke syndrome   . Vascular headache   . Other secondary hypertension   . Hypokalemia   . AKI (acute kidney injury) ([place])   . Lymphocytosis   . Lethargy   . Brain edema ([place])   . Intracranial aneurysm 07/09/2016  . Communicating hydrocephalus 02/03/2016  . Essential hypertension   . Paroxysmal SVT (supraventricular tachycardia) ([place])   . Paroxysmal atrial fibrillation (HCC)   . Tobacco abuse   . Tachypnea   . Bradycardia   . Leukocytosis   . Acute blood loss anemia   . Thrombocytosis ([place])   . Chest pain   . Nontraumatic subarachnoid hemorrhage ([place])   . Acute respiratory failure ([place])   . Encounter for central line placement   . Hypoxemia   . Renal  insufficiency   . Encounter for imaging study to confirm nasogastric (NG) tube placement   . Endotracheally intubated   . Respiratory failure ([place])   . SAH (subarachnoid hemorrhage) ([place])   . Subarachnoid hemorrhage due to ruptured aneurysm ([place]) 01/03/2016  . Subarachnoid hemorrhage ([place]) 01/03/2016  . Atrial fibrillation with rapid ventricular response ([place])   . Elevated lactic acid level   . SMOKER 03/20/2009  . HYPERTENSION, UNSPECIFIED 03/20/2009  . WPW 03/20/2009  . SVT/ PSVT/ PAT 03/20/2009  . BRONCHITIS 03/20/2009    Past Surgical History:  Procedure Laterality Date  . BREAST SURGERY     BX RT BREAST  BENIGN  . CRANIOTOMY N/A 01/04/2016   Procedure: Suboccipital Craniotomy and Cervical one Laminectomy for Clipping of Aneurysm;  Surgeon: Kevan Ny Ditty, MD;  Location: [facility];  Service: Neurosurgery;  Laterality: N/A;  . CRANIOTOMY N/A 07/09/2016   Procedure: Craniotomy for clipping of left middle cerebral artery aneurysm;  Surgeon: Kevan Ny Ditty, MD;  Location: [facility];  Service: Neurosurgery;  Laterality: N/A;  Craniotomy for clipping of left middle cerebral artery aneurysm  . 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 Christella Noa;  Surgeon: Kevan Ny Ditty, MD;  Location: [facility];  Service: Neurosurgery;  Laterality: Right;  . ELECTROPHYSIOLOGIC STUDY    . LAPAROSCOPIC REVISION VENTRICULAR-PERITONEAL (V-P) SHUNT N/A 02/04/2016   Procedure: LAPAROSCOPIC Insertion VENTRICULAR-PERITONEAL (V-P) SHUNT;  Surgeon: Rolm Bookbinder, MD;  Location: [facility];  Service: General;  Laterality: N/A;  . RADIOLOGY WITH ANESTHESIA N/A 01/03/2016   Procedure: RADIOLOGY WITH ANESTHESIA;  Surgeon: Medication Radiologist, MD;  Location: [facility];  Service: Radiology;  Laterality: N/A;  . VENTRICULOPERITONEAL SHUNT Right 02/04/2016   Procedure: SHUNT INSERTION VENTRICULAR-PERITONEAL With Laparoscopic  Assistance;  Surgeon: Kevan Ny Ditty, MD;  Location: [facility];  Service: Neurosurgery;  Laterality: Right;     OB History   None      Home Medications    Prior to Admission medications   Medication Sig Start Date End Date Taking? Authorizing Provider  acetaminophen (TYLENOL) 500 MG tablet Take 1,000 mg by mouth daily as needed for moderate pain or headache.   Yes [provider]  amLODipine (NORVASC) 5 MG tablet Take 5 mg by mouth daily.   Yes [provider]  rosuvastatin (CRESTOR) 20 MG tablet Take 20 mg by mouth daily.   Yes [provider]  VOLTAREN 1 % GEL APPLY 2 INCHES TO EACH JOINT QID 02/27/18  Yes [provider]  diazepam (VALIUM) 5 MG tablet Take 1 tablet (5 mg total) by mouth 2 (two) times daily. 03/14/18   Isla Pence, MD  HYDROcodone-acetaminophen (NORCO/VICODIN) 5-325 MG tablet Take 1 tablet by mouth every 4 (four) hours as needed. 03/14/18   Isla Pence, MD    Family History History reviewed. No pertinent family history.  Social History Social History   Tobacco Use  . Smoking status: Current Every Day Smoker    Packs/day: 0.50  . Smokeless tobacco: Never Used  Substance Use Topics  . Alcohol use: No  . Drug use: No     Allergies   No known allergies   Review of Systems Review of Systems  Musculoskeletal: Positive for back pain.       Left shoulder pain  All other systems reviewed and are negative.    Physical Exam Updated Vital Signs BP 140/70 (BP Location: Left Arm)   Pulse 77   Temp 98.3 F (36.8 C) (Oral)   Resp 16   Ht 5\' 3"  (1.6 m)   Wt 106.1 kg (234 lb)   SpO2 98%   BMI 41.45 kg/m   Physical Exam  Constitutional: She is oriented to person, place, and time. She appears well-developed and well-nourished.  HENT:  Head: Normocephalic and atraumatic.  Right Ear: External ear normal.  Left Ear: External ear normal.  Nose: Nose normal.  Mouth/Throat: Oropharynx is clear and moist.    Eyes: Pupils are equal, round, and reactive to light. Conjunctivae and EOM are normal.  Neck: Normal range of motion. Neck supple.  Cardiovascular: Normal rate, regular rhythm, normal heart sounds and intact distal pulses.  Pulmonary/Chest: Effort normal and breath sounds normal.  Abdominal: Soft. Bowel sounds are normal.  Musculoskeletal:       Left shoulder: She exhibits decreased range of motion.       Thoracic back: She exhibits tenderness and spasm.  Neurological: She is alert and oriented to person, place, and time.  Skin: Skin is warm. Capillary refill takes less than 2 seconds.  Psychiatric: She has a normal mood and affect. Her behavior is normal. Judgment and thought content normal.  Nursing note and vitals reviewed.    ED Treatments / Results  Labs (all labs ordered  are listed, but only abnormal results are displayed) Labs Reviewed - No data to display  EKG None  Radiology Dg Thoracic Spine 2 View  Result Date: 03/14/2018 CLINICAL DATA:  57 year old female with mid back and left flank pain radiating to both shoulders. EXAM: THORACIC SPINE 2 VIEWS COMPARISON:  Chest radiograph 07/21/2017. Chest CTA 01/13/2016. CTA head 07/07/2017. FINDINGS: Chronic CSF shunt. Catheter course through the right neck and chest appears stable. Cervicothoracic junction alignment is within normal limits. Normal thoracic vertebral height and alignment, stable since 2017. Relatively preserved thoracic disc spaces. Posterior ribs appear intact. Negative visible chest and upper abdominal visceral contours. IMPRESSION: 1. Negative radiographic appearance of the thoracic spine. 2. Chronic right anterior ventricular shunt catheter. Electronically Signed   By: Genevie Ann M.D.   On: 03/14/2018 21:28   Dg Shoulder Left  Result Date: 03/14/2018 CLINICAL DATA:  57 year old female left shoulder pain. EXAM: LEFT SHOULDER - 2+ VIEW COMPARISON:  Chest radiograph dated 07/21/2017 FINDINGS: There is no acute fracture  or dislocation. Mild arthritic changes of the left AC joint with bone spurring. No significant arthritic changes of the left humeral head or glenoid. The soft tissues appear unremarkable. IMPRESSION: Negative. Electronically Signed   By: Anner Crete M.D.   On: 03/14/2018 21:27    Procedures Procedures (including critical care time)  Medications Ordered in ED Medications  ketorolac (TORADOL) 30 MG/ML injection 30 mg (30 mg Intramuscular Given 03/14/18 2018)     Initial Impression / Assessment and Plan / ED Course  I have reviewed the triage vital signs and the nursing notes.  Pertinent labs & imaging results that were available during my care of the patient were reviewed by me and considered in my medical decision making (see chart for details).    Pt is feeling better.  Upper back pain likely due to splinting due to constant left shoulder pain.  Pt has a little arthritis on xray.  She is scheduled to see Dr. Aline Brochure in a few days.  She is encouraged to keep that appt.  Return if worse.  Final Clinical Impressions(s) / ED Diagnoses   Final diagnoses:  Shoulder arthritis  Muscle spasm    ED Discharge Orders        Ordered    HYDROcodone-acetaminophen (NORCO/VICODIN) 5-325 MG tablet  Every 4 hours PRN     03/14/18 2211    diazepam (VALIUM) 5 MG tablet  2 times daily     03/14/18 2211       Isla Pence, MD 03/14/18 2217

## 2018-03-14 NOTE — ED Triage Notes (Addendum)
Pt reports mid back pain/left flank pain that radiates up into bilateral shoulders. Pt has an appointment with Dr. Aline Brochure on Wednesday. Pt reports her shoulder has been hurting x 1 yr.

## 2018-03-14 NOTE — ED Notes (Signed)
Patient transported to X-ray 

## 2018-03-17 ENCOUNTER — Ambulatory Visit (INDEPENDENT_AMBULATORY_CARE_PROVIDER_SITE_OTHER): Payer: Medicaid Other

## 2018-03-17 ENCOUNTER — Ambulatory Visit: Payer: Medicaid Other | Admitting: Orthopedic Surgery

## 2018-03-17 ENCOUNTER — Encounter: Payer: Self-pay | Admitting: Orthopedic Surgery

## 2018-03-17 VITALS — BP 151/98 | HR 65 | Ht 63.0 in | Wt 230.0 lb

## 2018-03-17 DIAGNOSIS — M542 Cervicalgia: Secondary | ICD-10-CM

## 2018-03-17 MED ORDER — GABAPENTIN 100 MG PO CAPS: 100.0000 mg | ORAL_CAPSULE | Freq: Three times a day (TID) | ORAL | 2 refills | Status: DC

## 2018-03-17 MED ORDER — NAPROXEN 500 MG PO TABS: 500.0000 mg | ORAL_TABLET | Freq: Two times a day (BID) | ORAL | 2 refills | Status: DC

## 2018-03-17 MED ORDER — CYCLOBENZAPRINE HCL 10 MG PO TABS: 10.0000 mg | ORAL_TABLET | Freq: Two times a day (BID) | ORAL | 1 refills | Status: DC | PRN

## 2018-03-17 NOTE — Addendum Note (Signed)
Addended byCandice Camp on: 03/17/2018 11:16 AM   Modules accepted: Orders

## 2018-03-17 NOTE — Progress Notes (Signed)
BP 151/98  P 65 Ht 5'3" Wt 230lbs

## 2018-03-17 NOTE — Progress Notes (Signed)
NEW PATIENT OFFICE VISIT   Chief Complaint  Patient presents with  . Neck Pain    into right shoulder/ arm since hospital stay Nov 2018 forBrain Aneurysm     57 year old female presents with bilateral neck pain for evaluation from Dr. Fransico [facility]  She is 57 years old she had a aneurysm and since the time of the aneurysm she is noticed that she has had midline cervical spine pain radiating into both shoulders left worse than right.  She did take some Tylenol 3 from Dr. Virgel Bouquet which did not the pain out but when she stopped that medication the pain came back she also went to the emergency room where she was given hydrocodone and Valium and is taking that right now with fairly good relief.  The pain is dull aching over the midline of the base of the cervical spine is been present for over a year and getting worse it is associated with bilateral shoulder pain and some loss of forward elevation of the shoulder but the pain is still in the neck when she raises her arms.   Review of Systems  Constitutional: Negative for fever.  Musculoskeletal: Positive for neck pain.  Neurological: Negative for tingling, sensory change, focal weakness and weakness.     Past Medical History:  Diagnosis Date  . Arthritis   . Brain aneurysm   . Headache   . Hypertension    QUIT TAKING BP MEDS NONE IN 10+ YEARS  . 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 Cristopher Peru)  . Thyroid disease    PROCEDURE FOR THYROID 15 YRS AGO AT [facility]  . WPW (Wolff-Parkinson-White syndrome)     Past Surgical History:  Procedure Laterality Date  . BREAST SURGERY     BX RT BREAST  BENIGN  . CRANIOTOMY N/A 01/04/2016   Procedure: Suboccipital Craniotomy and Cervical one Laminectomy for Clipping of Aneurysm;  Surgeon: Kevan Ny Ditty, MD;  Location: [place] [facility];  Service: Neurosurgery;  Laterality: N/A;  . CRANIOTOMY N/A 07/09/2016   Procedure: Craniotomy  for clipping of left middle cerebral artery aneurysm;  Surgeon: Kevan Ny Ditty, MD;  Location: [place] [facility];  Service: Neurosurgery;  Laterality: N/A;  Craniotomy for clipping of left middle cerebral artery aneurysm  . 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 Christella Noa;  Surgeon: Kevan Ny Ditty, MD;  Location: [place];  Service: Neurosurgery;  Laterality: Right;  . ELECTROPHYSIOLOGIC STUDY    . LAPAROSCOPIC REVISION VENTRICULAR-PERITONEAL (V-P) SHUNT N/A 02/04/2016   Procedure: LAPAROSCOPIC Insertion VENTRICULAR-PERITONEAL (V-P) SHUNT;  Surgeon: Rolm Bookbinder, MD;  Location: [place] [facility];  Service: General;  Laterality: N/A;  . RADIOLOGY WITH ANESTHESIA N/A 01/03/2016   Procedure: RADIOLOGY WITH ANESTHESIA;  Surgeon: Medication Radiologist, MD;  Location: [place];  Service: Radiology;  Laterality: N/A;  . VENTRICULOPERITONEAL SHUNT Right 02/04/2016   Procedure: SHUNT INSERTION VENTRICULAR-PERITONEAL With Laparoscopic Assistance;  Surgeon: Kevan Ny Ditty, MD;  Location: [place] [facility];  Service: Neurosurgery;  Laterality: Right;    History reviewed. No pertinent family history. Social History   Tobacco Use  . Smoking status: Current Every Day Smoker    Packs/day: 0.50  . Smokeless tobacco: Never Used  Substance Use Topics  . Alcohol use: No  . Drug use: No    @ALL @  Current Meds  Medication Sig  . amLODipine (NORVASC) 5 MG tablet Take 5 mg by mouth  daily.  . diazepam (VALIUM) 5 MG tablet Take 1 tablet (5 mg total) by mouth 2 (two) times daily.  Marland Kitchen HYDROcodone-acetaminophen (NORCO/VICODIN) 5-325 MG tablet Take 1 tablet by mouth every 4 (four) hours as needed.  . rosuvastatin (CRESTOR) 20 MG tablet Take 20 mg by mouth daily.  . VOLTAREN 1 % GEL APPLY 2 INCHES TO EACH JOINT QID    There were no vitals taken for this visit.  Physical Exam  Constitutional: She is oriented to person, place, and  time. She appears well-developed and well-nourished.  Neurological: She is alert and oriented to person, place, and time.  Psychiatric: She has a normal mood and affect. Judgment normal.  Vitals reviewed.   Ortho Exam The patient's right and left upper extremities show normal range of motion stability strength and alignment skin warm dry intact without lesion or ulceration reflexes 2+ and equal with negative Hoffmann sign normal pulses and no lymphadenopathy in either axilla  Gait shows no changes  Cervical spine shows midline cervical tenderness with mild pain with flexion extension mild loss of range of motion in each direction MEDICAL DECISION SECTION  xrays ordered? NO  My independent reading of xrays: I hav internal films dated March 27  X-rays show possible bifid posterior arch at C3-4 level  Abnormal cervical contours on the AP and lateral views but no evidence of disc degeneration     Encounter Diagnosis  Name Primary?  . Neck pain Yes     PLAN:   I do not see any surgical pathology here  She has not had any physical therapy or anti-inflammatories  I will start her on a regimen of gabapentin for pain with Naprosyn per Medicaid guidelines and a muscle relaxer  Meds ordered this encounter  Medications  . naproxen (NAPROSYN) 500 MG tablet    Sig: Take 1 tablet (500 mg total) by mouth 2 (two) times daily with a meal.    Dispense:  60 tablet    Refill:  2  . cyclobenzaprine (FLEXERIL) 10 MG tablet    Sig: Take 1 tablet (10 mg total) by mouth 2 (two) times daily as needed for muscle spasms.    Dispense:  40 tablet    Refill:  1  . gabapentin (NEURONTIN) 100 MG capsule    Sig: Take 1 capsule (100 mg total) by mouth 3 (three) times daily.    Dispense:  90 capsule    Refill:  2   Physical therapy will be ordered  Since no surgery is needed I will have her follow-up with Dr. Criss Rosales

## 2018-04-01 ENCOUNTER — Other Ambulatory Visit: Payer: Self-pay

## 2018-04-01 ENCOUNTER — Ambulatory Visit (HOSPITAL_COMMUNITY): Payer: Medicaid Other | Attending: Orthopedic Surgery | Admitting: Occupational Therapy

## 2018-04-01 ENCOUNTER — Encounter (HOSPITAL_COMMUNITY): Payer: Self-pay | Admitting: Occupational Therapy

## 2018-04-01 DIAGNOSIS — M25512 Pain in left shoulder: Secondary | ICD-10-CM | POA: Diagnosis present

## 2018-04-01 DIAGNOSIS — G8929 Other chronic pain: Secondary | ICD-10-CM | POA: Insufficient documentation

## 2018-04-01 DIAGNOSIS — M25511 Pain in right shoulder: Secondary | ICD-10-CM | POA: Insufficient documentation

## 2018-04-01 DIAGNOSIS — R29898 Other symptoms and signs involving the musculoskeletal system: Secondary | ICD-10-CM | POA: Diagnosis present

## 2018-04-01 NOTE — Therapy (Signed)
[facility] [place], [place], [address] Phone: [phone]   Fax:  [phone]  Occupational Therapy Evaluation  Patient Details  Name: [name] [name] MRN: [id number] Date of Birth: 1961/09/17 Referring Provider: Dr. Arther Abbott   Encounter Date: 04/01/2018  OT End of Session - 04/01/18 1251    Visit Number  1    Number of Visits  8    Date for OT Re-Evaluation  05/01/18    Authorization Type  Medicaid    Authorization Time Period  Requesting 3 initial visits    Authorization - Visit Number  0    Authorization - Number of Visits  3    OT Start Time  5053    OT Stop Time  1151    OT Time Calculation (min)  34 min    Activity Tolerance  Patient tolerated treatment well    Behavior During Therapy  [facility] for tasks assessed/performed       Past Medical History:  Diagnosis Date  . Arthritis   . Brain aneurysm   . Headache   . Hypertension    QUIT TAKING BP MEDS NONE IN 10+ YEARS  . 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 Cristopher Peru)  . Thyroid disease    PROCEDURE FOR THYROID 15 YRS AGO AT [facility]  . WPW (Wolff-Parkinson-White syndrome)     Past Surgical History:  Procedure Laterality Date  . BREAST SURGERY     BX RT BREAST  BENIGN  . CRANIOTOMY N/A 01/04/2016   Procedure: Suboccipital Craniotomy and Cervical one Laminectomy for Clipping of Aneurysm;  Surgeon: Kevan Ny Ditty, MD;  Location: [place] [facility];  Service: Neurosurgery;  Laterality: N/A;  . CRANIOTOMY N/A 07/09/2016   Procedure: Craniotomy for clipping of left middle cerebral artery aneurysm;  Surgeon: Kevan Ny Ditty, MD;  Location: [place] [facility];  Service: Neurosurgery;  Laterality: N/A;  Craniotomy for clipping of left middle cerebral artery aneurysm  . 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 Christella Noa;   Surgeon: Kevan Ny Ditty, MD;  Location: [place];  Service: Neurosurgery;  Laterality: Right;  . ELECTROPHYSIOLOGIC STUDY    . LAPAROSCOPIC REVISION VENTRICULAR-PERITONEAL (V-P) SHUNT N/A 02/04/2016   Procedure: LAPAROSCOPIC Insertion VENTRICULAR-PERITONEAL (V-P) SHUNT;  Surgeon: Rolm Bookbinder, MD;  Location: [place] [facility];  Service: General;  Laterality: N/A;  . RADIOLOGY WITH ANESTHESIA N/A 01/03/2016   Procedure: RADIOLOGY WITH ANESTHESIA;  Surgeon: Medication Radiologist, MD;  Location: [place];  Service: Radiology;  Laterality: N/A;  . VENTRICULOPERITONEAL SHUNT Right 02/04/2016   Procedure: SHUNT INSERTION VENTRICULAR-PERITONEAL With Laparoscopic Assistance;  Surgeon: Kevan Ny Ditty, MD;  Location: [place] [facility];  Service: Neurosurgery;  Laterality: Right;    There were no vitals filed for this visit.  Subjective Assessment - 04/01/18 1248    Subjective   S: I've been having pain since that surgery I had.     Pertinent History  Pt is a 57 y/o female presenting with cervical neck and bilateral shoulder pain, left side pain greater than right side. Pt had sx in 2017 for brain aneurysm, believes the positioning of her head and neck may be what is causing her pain. Pt reports improvement in pain since MD prescribed new medications. Pt was referred to occupational therapy for evaluation and treatment by Dr. Arther Abbott.     Patient Stated Goals  To have  less pain in my neck and shoulder    Currently in Pain?  Yes    Pain Score  4     Pain Location  Shoulder    Pain Orientation  Left    Pain Descriptors / Indicators  Sore    Pain Type  Chronic pain    Pain Radiating Towards  up to neck    Pain Onset  More than a month ago    Pain Frequency  Constant    Aggravating Factors   movement, stretching    Pain Relieving Factors  rest, pain medications    Effect of Pain on Daily Activities  mod effect on ADL completion    Multiple Pain Sites  No        [facility] OT Assessment - 04/01/18  1115      Assessment   Medical Diagnosis  neck, bilateral shoulder pain    Referring Provider  Dr. Arther Abbott    Onset Date/Surgical Date  -- 2017 after sx    Hand Dominance  Right    Prior Therapy  None      Precautions   Precautions  None      Balance Screen   Has the patient fallen in the past 6 months  No    Has the patient had a decrease in activity level because of a fear of falling?   No    Is the patient reluctant to leave their home because of a fear of falling?   No      Prior Function   Level of Independence  Independent    Vocation  On disability    Leisure  watching tv      ADL   ADL comments  Pt is having difficulty with opening the car door, reaching behind and overhead, washing back, dressing tasks, grooming tasks, lifting pots and pans, sleeping, driving at times      Written Expression   Dominant Hand  Right      ROM / Strength   AROM / PROM / Strength  AROM;PROM;Strength      Palpation   Palpation comment  mod/max fascial restrictions along bilateral upper arms and trapezius, as well as cervical neck      AROM   Overall AROM Comments  Assessed seated, er/IR adducted    AROM Assessment Site  Shoulder;Cervical    Right/Left Shoulder  Right;Left    Right Shoulder Flexion  145 Degrees    Right Shoulder ABduction  140 Degrees    Right Shoulder Internal Rotation  90 Degrees    Right Shoulder External Rotation  45 Degrees    Left Shoulder Flexion  114 Degrees    Left Shoulder ABduction  91 Degrees    Left Shoulder Internal Rotation  90 Degrees    Left Shoulder External Rotation  49 Degrees    Cervical Flexion  45 WNL    Cervical Extension  45 WNL    Cervical - Right Side Bend  29    Cervical - Left Side Bend  40    Cervical - Right Rotation  32    Cervical - Left Rotation  65      PROM   Overall PROM Comments  Assessed supine, er/IR adducted    PROM Assessment Site  Shoulder    Right/Left Shoulder  Right;Left    Right Shoulder Flexion  155  Degrees    Right Shoulder ABduction  170 Degrees    Right Shoulder Internal Rotation  90  Degrees    Right Shoulder External Rotation  85 Degrees    Left Shoulder Flexion  140 Degrees    Left Shoulder ABduction  133 Degrees    Left Shoulder Internal Rotation  90 Degrees    Left Shoulder External Rotation  73 Degrees      Strength   Overall Strength Comments  Assessed seated, er/IR adducted    Strength Assessment Site  Shoulder    Right/Left Shoulder  Right;Left    Right Shoulder Flexion  4/5    Right Shoulder ABduction  4/5    Right Shoulder Internal Rotation  4/5    Right Shoulder External Rotation  4-/5    Left Shoulder Flexion  4-/5    Left Shoulder ABduction  4-/5    Left Shoulder Internal Rotation  4/5    Left Shoulder External Rotation  4-/5                      OT Education - 04/01/18 1141    Education provided  Yes    Education Details  shoulder stretches    Person(s) Educated  Patient    Methods  Explanation;Demonstration;Handout    Comprehension  Verbalized understanding;Returned demonstration       OT Short Term Goals - 04/01/18 1336      OT SHORT TERM GOAL #1   Title  Pt will be provided with and educated on HEP to improve BUE and cervical neck functional use during ADLs.     Time  4    Period  Weeks    Status  New    Target Date  05/01/18      OT SHORT TERM GOAL #2   Title  Pt will decrease pain in BUE and cervial neck to 3/10 or less to improve ability to sleep.     Time  4    Period  Weeks    Status  New      OT SHORT TERM GOAL #3   Title  Pt will decrease fascial restrictions in BUE and cervical neck to minimal amounts to improve mobility required for functional reaching tasks.     Time  4    Period  Weeks    Status  New      OT SHORT TERM GOAL #4   Title  Pt will improve BUE ROM to WNL to increase ability to perform dressing tasks without compensatory strategies.     Time  4    Period  Weeks    Status  New      OT SHORT TERM  GOAL #5   Title  Pt will increase cervical neck ROM to [facility] to improve ability to turn head when driving.     Time  4    Period  Weeks    Status  New      Additional Short Term Goals   Additional Short Term Goals  Yes      OT SHORT TERM GOAL #6   Title  Pt will improve BUE strength to 4+/5 to improve ability to lift pots and pans during meal preparation tasks.     Time  4    Period  Weeks    Status  New               Plan - 04/01/18 1251    Clinical Impression Statement  A: Pt is a 57 y/o female presenting with cervical neck and bilateral shoulder pain, decreased ROM and strength  limiting ability to complete functional tasks. Pt provided with and educated on shoulder stretches for HEP today.     Occupational Profile and client history currently impacting functional performance  Pt is independent in ADLs and motivated to return to highest level of independence    Occupational performance deficits (Please refer to evaluation for details):  ADL's;IADL's;Rest and Sleep;Leisure;Social Participation    Rehab Potential  Good    OT Frequency  2x / week    OT Duration  4 weeks    OT Treatment/Interventions  Self-care/ADL training;Therapeutic exercise;Ultrasound;Manual Therapy;Therapeutic activities;Cryotherapy;Electrical Stimulation;Moist Heat;Passive range of motion;Patient/family education    Plan  P: Pt will benefit from skilled OT services to decrease pain and fascial restrictions, increase ROM, strength, and functional use of BUE and cervical neck during ADL completion. Treatment plan: Myofascial release, manual therapy, P/ROM, AA/ROM, A/ROM, gentle manual traction to cervical region, gentle BUE strengthening, modalities prn    Clinical Decision Making  Limited treatment options, no task modification necessary    OT Home Exercise Plan  4/11: shoulder stretches    Consulted and Agree with Plan of Care  Patient       Patient will benefit from skilled therapeutic intervention in order  to improve the following deficits and impairments:  Decreased activity tolerance, Decreased strength, Impaired flexibility, Decreased range of motion, Pain, Impaired UE functional use, Increased fascial restrictions  Visit Diagnosis: Chronic left shoulder pain  Chronic right shoulder pain  Other symptoms and signs involving the musculoskeletal system    Problem List Patient Active Problem List   Diagnosis Date Noted  . Left hemiparesis ([place]) 11/17/2016  . Idiopathic hypotension   . Cognitive deficit, post-stroke   . CVA (cerebral vascular accident) ([place]) 10/29/2016  . Stroke due to embolism of middle cerebral artery ([place]) 10/29/2016  . Seizure prophylaxis   . History of supraventricular tachycardia   . Neurologic abnormality   . Stroke syndrome   . Vascular headache   . Other secondary hypertension   . Hypokalemia   . AKI (acute kidney injury) ([place])   . Lymphocytosis   . Lethargy   . Brain edema ([place])   . Intracranial aneurysm 07/09/2016  . Communicating hydrocephalus 02/03/2016  . Essential hypertension   . Paroxysmal SVT (supraventricular tachycardia) ([place])   . Paroxysmal atrial fibrillation (HCC)   . Tobacco abuse   . Tachypnea   . Bradycardia   . Leukocytosis   . Acute blood loss anemia   . Thrombocytosis ([place])   . Chest pain   . Nontraumatic subarachnoid hemorrhage ([place])   . Acute respiratory failure ([place])   . Encounter for central line placement   . Hypoxemia   . Renal insufficiency   . Encounter for imaging study to confirm nasogastric (NG) tube placement   . Endotracheally intubated   . Respiratory failure ([place])   . SAH (subarachnoid hemorrhage) ([place])   . Subarachnoid hemorrhage due to ruptured aneurysm ([place]) 01/03/2016  . Subarachnoid hemorrhage ([place]) 01/03/2016  . Atrial fibrillation with rapid ventricular response ([place])   . Elevated lactic acid level   . SMOKER 03/20/2009  . HYPERTENSION, UNSPECIFIED 03/20/2009  . WPW 03/20/2009  . SVT/ PSVT/ PAT  03/20/2009  . BRONCHITIS 03/20/2009   Guadelupe Sabin, OTR/L  [phone] 04/01/2018, 3:28 PM  [facility] [address] [place], [place], [address] Phone: [phone]   Fax:  [phone]  Name: [name] [name] MRN: [id number] Date of Birth: 1961-02-28

## 2018-04-01 NOTE — Patient Instructions (Signed)
  1) Flexion Wall Stretch    Face wall, place affected handon wall in front of you. Slide hand up the wall  and lean body in towards the wall. Hold for 10 seconds. Repeat 3-5 times. 1-2 times/day.     2) Towel Stretch with Internal Rotation   Or     Gently pull up (or to the side) your affected arm  behind your back with the assist of a towel. Hold 10 seconds, repeat 3-5 times. 1-2 times/day.             3) Corner Stretch    Stand at a corner of a wall, place your arms on the walls with elbows bent. Lean into the corner until a stretch is felt along the front of your chest and/or shoulders. Hold for 10 seconds. Repeat 3-5X, 1-2 times/day.    4) Posterior Capsule Stretch    Bring the involved arm across chest. Grasp elbow and pull toward chest until you feel a stretch in the back of the upper arm and shoulder. Hold 10 seconds. Repeat 3-5X. Complete 1-2 times/day.    5) Scapular Retraction    Tuck chin back as you pinch shoulder blades together.  Hold 5 seconds. Repeat 3-5X. Complete 1-2 times/day.    6) External Rotation Stretch:     Place your affected hand on the wall with the elbow bent and gently turn your body the opposite direction until a stretch is felt. Hold 10 seconds, repeat 3-5X. Complete 1-2 times/day.   OR    Standing in an open doorway, place your arm on the edge of the doorway with the shoulder at 90 degrees from the side and elbow bent to 90 degrees. Lean forward until you feel a stretch on the front of your shoulder. Keep neck relaxed. Hold 10 seconds, repeat 3-5X. Complete 1-2 times/day  

## 2018-04-09 ENCOUNTER — Ambulatory Visit (HOSPITAL_COMMUNITY): Payer: Medicaid Other

## 2018-04-14 ENCOUNTER — Other Ambulatory Visit: Payer: Self-pay

## 2018-04-14 ENCOUNTER — Ambulatory Visit (HOSPITAL_COMMUNITY): Payer: Medicaid Other

## 2018-04-14 ENCOUNTER — Encounter (HOSPITAL_COMMUNITY): Payer: Self-pay

## 2018-04-14 DIAGNOSIS — M25511 Pain in right shoulder: Secondary | ICD-10-CM

## 2018-04-14 DIAGNOSIS — M25512 Pain in left shoulder: Secondary | ICD-10-CM | POA: Diagnosis not present

## 2018-04-14 DIAGNOSIS — G8929 Other chronic pain: Secondary | ICD-10-CM

## 2018-04-14 DIAGNOSIS — R29898 Other symptoms and signs involving the musculoskeletal system: Secondary | ICD-10-CM

## 2018-04-14 NOTE — Patient Instructions (Signed)
UPPER TRAP STRETCH - HOLDING CHAIR  While sitting in a chair, hold the seat with one hand and bend your head towards the opposite side for a gentle stretch to the side of the neck. Hold for 15-30 seconds. Complete 2 times.

## 2018-04-14 NOTE — Therapy (Signed)
[facility] [place], [place], [address] Phone: [phone]   Fax:  [phone]  Occupational Therapy Treatment  Patient Details  Name: DEOLINDA FRID MRN: [id number] Date of Birth: [birth year] Referring Provider: Dr. Arther Abbott   Encounter Date: 04/14/2018  OT End of Session - 04/14/18 1503    Visit Number  2    Number of Visits  8    Date for OT Re-Evaluation  05/01/18    Authorization Type  Medicaid    Authorization Time Period  Approved 3 visits 04/09/18-04/22/18    Authorization - Visit Number  1    Authorization - Number of Visits  3    OT Start Time  1350    OT Stop Time  1430    OT Time Calculation (min)  40 min    Activity Tolerance  Patient tolerated treatment well    Behavior During Therapy  [facility] for tasks assessed/performed       Past Medical History:  Diagnosis Date  . Arthritis   . Brain aneurysm   . Headache   . Hypertension    QUIT TAKING BP MEDS NONE IN 10+ YEARS  . 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 Cristopher Peru)  . Thyroid disease    PROCEDURE FOR THYROID 15 YRS AGO AT [facility]  . WPW (Wolff-Parkinson-White syndrome)     Past Surgical History:  Procedure Laterality Date  . BREAST SURGERY     BX RT BREAST  BENIGN  . CRANIOTOMY N/A 01/04/2016   Procedure: Suboccipital Craniotomy and Cervical one Laminectomy for Clipping of Aneurysm;  Surgeon: Kevan Ny Ditty, MD;  Location: [place];  Service: Neurosurgery;  Laterality: N/A;  . CRANIOTOMY N/A 07/09/2016   Procedure: Craniotomy for clipping of left middle cerebral artery aneurysm;  Surgeon: Kevan Ny Ditty, MD;  Location: [facility];  Service: Neurosurgery;  Laterality: N/A;  Craniotomy for clipping of left middle cerebral artery aneurysm  . 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  Christella Noa;  Surgeon: Kevan Ny Ditty, MD;  Location: [facility];  Service: Neurosurgery;  Laterality: Right;  . ELECTROPHYSIOLOGIC STUDY    . LAPAROSCOPIC REVISION VENTRICULAR-PERITONEAL (V-P) SHUNT N/A 02/04/2016   Procedure: LAPAROSCOPIC Insertion VENTRICULAR-PERITONEAL (V-P) SHUNT;  Surgeon: Rolm Bookbinder, MD;  Location: [place] [facility];  Service: General;  Laterality: N/A;  . RADIOLOGY WITH ANESTHESIA N/A 01/03/2016   Procedure: RADIOLOGY WITH ANESTHESIA;  Surgeon: Medication Radiologist, MD;  Location: [facility];  Service: Radiology;  Laterality: N/A;  . VENTRICULOPERITONEAL SHUNT Right 02/04/2016   Procedure: SHUNT INSERTION VENTRICULAR-PERITONEAL With Laparoscopic Assistance;  Surgeon: Kevan Ny Ditty, MD;  Location: [facility];  Service: Neurosurgery;  Laterality: Right;    There were no vitals filed for this visit.  Subjective Assessment - 04/14/18 1417    Subjective   S: I started taking my pain medication and it has helped the pain decrease to a three.     Currently in Pain?  Yes    Pain Score  3     Pain Location  Shoulder neck    Pain Orientation  Left;Right;Anterior;Posterior    Pain Descriptors / Indicators  Sore    Pain Type  Chronic pain    Pain Radiating Towards  up to neck    Pain Onset  More than a month ago    Pain Frequency  Constant  Aggravating Factors   movement, stretching    Pain Relieving Factors  rest, pain medication    Effect of Pain on Daily Activities  mod effect on ADL completion    Multiple Pain Sites  No         [facility] OT Assessment - 04/14/18 1418      Assessment   Medical Diagnosis  neck, bilateral shoulder pain      Precautions   Precautions  None               OT Treatments/Exercises (OP) - 04/14/18 1418      Exercises   Exercises  Shoulder;Neck      Neck Exercises: Supine   Cervical Rotation  Right;Left;5 reps      Shoulder Exercises: Supine   Protraction  PROM;AROM;10 reps    Horizontal ABduction  PROM;AROM;10 reps     External Rotation  PROM;AROM;10 reps    Internal Rotation  PROM;AROM;10 reps    Flexion  PROM;AROM;10 reps    ABduction  PROM;AROM;10 reps      Manual Therapy   Manual Therapy  Myofascial release;Soft tissue mobilization    Manual therapy comments  Manual therapy completed prior to exercises    Myofascial Release  Myofascial release and manual stretching completed to BUE upper trapezius and scapularis region as well as bilateral cervical region to increase joint mobility and ROM in a pain free zone.             OT Education - 04/14/18 1458    Education provided  Yes    Education Details  upper trapezius stretch    Person(s) Educated  Patient    Methods  Explanation;Handout;Demonstration;Verbal cues    Comprehension  Verbalized understanding;Returned demonstration       OT Short Term Goals - 04/14/18 1506      OT SHORT TERM GOAL #1   Title  Pt will be provided with and educated on HEP to improve BUE and cervical neck functional use during ADLs.     Time  4    Period  Weeks    Status  On-going      OT SHORT TERM GOAL #2   Title  Pt will decrease pain in BUE and cervial neck to 3/10 or less to improve ability to sleep.     Time  4    Period  Weeks    Status  On-going      OT SHORT TERM GOAL #3   Title  Pt will decrease fascial restrictions in BUE and cervical neck to minimal amounts to improve mobility required for functional reaching tasks.     Time  4    Period  Weeks    Status  On-going      OT SHORT TERM GOAL #4   Title  Pt will improve BUE ROM to WNL to increase ability to perform dressing tasks without compensatory strategies.     Time  4    Period  Weeks    Status  On-going      OT SHORT TERM GOAL #5   Title  Pt will increase cervical neck ROM to [facility] to improve ability to turn head when driving.     Time  4    Period  Weeks    Status  On-going      OT SHORT TERM GOAL #6   Title  Pt will improve BUE strength to 4+/5 to improve ability to lift pots and  pans during meal preparation  tasks.     Time  4    Period  Weeks    Status  On-going               Plan - 04/14/18 1504    Clinical Impression Statement  A: Initiated myofascial release, manual stretching, and A/ROM shoulder exercises. Provided patient with upper trapezius stretch for bilateral cervical region. VC for form and technique.     Plan  P: Add standing A/ROM shoulder exercises and scapular strengthening with red band. Continue with manual therapy for increased fascial restrictions in BUE and cervical region.     Consulted and Agree with Plan of Care  Patient       Patient will benefit from skilled therapeutic intervention in order to improve the following deficits and impairments:  Decreased activity tolerance, Decreased strength, Impaired flexibility, Decreased range of motion, Pain, Impaired UE functional use, Increased fascial restrictions  Visit Diagnosis: Chronic left shoulder pain  Chronic right shoulder pain  Other symptoms and signs involving the musculoskeletal system    Problem List Patient Active Problem List   Diagnosis Date Noted  . Left hemiparesis ([place]) 11/17/2016  . Idiopathic hypotension   . Cognitive deficit, post-stroke   . CVA (cerebral vascular accident) ([place]) 10/29/2016  . Stroke due to embolism of middle cerebral artery ([place]) 10/29/2016  . Seizure prophylaxis   . History of supraventricular tachycardia   . Neurologic abnormality   . Stroke syndrome   . Vascular headache   . Other secondary hypertension   . Hypokalemia   . AKI (acute kidney injury) ([place])   . Lymphocytosis   . Lethargy   . Brain edema ([place])   . Intracranial aneurysm 07/09/2016  . Communicating hydrocephalus 02/03/2016  . Essential hypertension   . Paroxysmal SVT (supraventricular tachycardia) ([place])   . Paroxysmal atrial fibrillation (HCC)   . Tobacco abuse   . Tachypnea   . Bradycardia   . Leukocytosis   . Acute blood loss anemia   . Thrombocytosis ([place])    . Chest pain   . Nontraumatic subarachnoid hemorrhage ([place])   . Acute respiratory failure ([place])   . Encounter for central line placement   . Hypoxemia   . Renal insufficiency   . Encounter for imaging study to confirm nasogastric (NG) tube placement   . Endotracheally intubated   . Respiratory failure ([place])   . SAH (subarachnoid hemorrhage) ([place])   . Subarachnoid hemorrhage due to ruptured aneurysm ([place]) 01/03/2016  . Subarachnoid hemorrhage ([place]) 01/03/2016  . Atrial fibrillation with rapid ventricular response ([place])   . Elevated lactic acid level   . SMOKER 03/20/2009  . HYPERTENSION, UNSPECIFIED 03/20/2009  . WPW 03/20/2009  . SVT/ PSVT/ PAT 03/20/2009  . BRONCHITIS 03/20/2009   Ailene Ravel, OTR/L,CBIS  [phone]  04/14/2018, 3:07 PM  [facility] [address] [place], [place], [address] Phone: [phone]   Fax:  [phone]  Name: DANAISHA CELLI MRN: [id number] Date of Birth: November 02, 1961

## 2018-04-20 ENCOUNTER — Encounter (HOSPITAL_COMMUNITY): Payer: Self-pay | Admitting: Specialist

## 2018-04-20 ENCOUNTER — Ambulatory Visit (HOSPITAL_COMMUNITY): Payer: Medicaid Other | Admitting: Specialist

## 2018-04-20 DIAGNOSIS — G8929 Other chronic pain: Secondary | ICD-10-CM

## 2018-04-20 DIAGNOSIS — M25512 Pain in left shoulder: Secondary | ICD-10-CM | POA: Diagnosis not present

## 2018-04-20 DIAGNOSIS — M25511 Pain in right shoulder: Secondary | ICD-10-CM

## 2018-04-20 DIAGNOSIS — R29898 Other symptoms and signs involving the musculoskeletal system: Secondary | ICD-10-CM

## 2018-04-20 NOTE — Therapy (Signed)
[facility] [place], [place], [address] Phone: [phone]   Fax:  [phone]  Occupational Therapy Treatment  Patient Details  Name: [name] [name] MRN: [id number] Date of Birth: [birth year] Referring Provider: Dr. Arther Abbott   Encounter Date: 04/20/2018  OT End of Session - 04/20/18 1320    Visit Number  3    Number of Visits  8    Date for OT Re-Evaluation  05/01/18    Authorization Type  Medicaid    Authorization Time Period  Approved 3 visits 04/09/18-04/22/18; requesting additional 8 visits from 04/23/18-05/24/18    Authorization - Visit Number  2    Authorization - Number of Visits  3    OT Start Time  0900    OT Stop Time  0945    OT Time Calculation (min)  45 min    Activity Tolerance  Patient tolerated treatment well    Behavior During Therapy  [facility] for tasks assessed/performed       Past Medical History:  Diagnosis Date  . Arthritis   . Brain aneurysm   . Headache   . Hypertension    QUIT TAKING BP MEDS NONE IN 10+ YEARS  . 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 Cristopher Peru)  . Thyroid disease    PROCEDURE FOR THYROID 15 YRS AGO AT [facility]  . WPW (Wolff-Parkinson-White syndrome)     Past Surgical History:  Procedure Laterality Date  . BREAST SURGERY     BX RT BREAST  BENIGN  . CRANIOTOMY N/A 01/04/2016   Procedure: Suboccipital Craniotomy and Cervical one Laminectomy for Clipping of Aneurysm;  Surgeon: Kevan Ny Ditty, MD;  Location: [place] [facility];  Service: Neurosurgery;  Laterality: N/A;  . CRANIOTOMY N/A 07/09/2016   Procedure: Craniotomy for clipping of left middle cerebral artery aneurysm;  Surgeon: Kevan Ny Ditty, MD;  Location: [facility] [facility];  Service: Neurosurgery;  Laterality: N/A;  Craniotomy for clipping of left middle cerebral artery aneurysm  . CRANIOTOMY Right 10/24/2016   Procedure: Right Orbitozygomatic Craniotomy  for clipping of basilar tip aneurysm with Dr. Christella Noa;  Surgeon: Kevan Ny Ditty, MD;  Location: [facility];  Service: Neurosurgery;  Laterality: Right;  . ELECTROPHYSIOLOGIC STUDY    . LAPAROSCOPIC REVISION VENTRICULAR-PERITONEAL (V-P) SHUNT N/A 02/04/2016   Procedure: LAPAROSCOPIC Insertion VENTRICULAR-PERITONEAL (V-P) SHUNT;  Surgeon: Rolm Bookbinder, MD;  Location: [facility];  Service: General;  Laterality: N/A;  . RADIOLOGY WITH ANESTHESIA N/A 01/03/2016   Procedure: RADIOLOGY WITH ANESTHESIA;  Surgeon: Medication Radiologist, MD;  Location: [facility];  Service: Radiology;  Laterality: N/A;  . VENTRICULOPERITONEAL SHUNT Right 02/04/2016   Procedure: SHUNT INSERTION VENTRICULAR-PERITONEAL With Laparoscopic Assistance;  Surgeon: Kevan Ny Ditty, MD;  Location: [place] [facility];  Service: Neurosurgery;  Laterality: Right;    There were no vitals filed for this visit.  Subjective Assessment - 04/20/18 0904    Subjective   S:  Its hurting today. I didnt take my medicine.    Currently in Pain?  Yes    Pain Score  7     Pain Location  Shoulder    Pain Orientation  Left;Proximal    Pain Descriptors / Indicators  Sore         [facility] OT Assessment - 04/20/18 0001      Assessment   Medical Diagnosis  neck, bilateral shoulder pain      Precautions   Precautions  None  ADL   ADL comments  increased use of bilateral arms with less pain.  continued difficulty reaching overhead and lifting heavy items with her left arm      AROM   Overall AROM Comments  Assessed seated, er/IR adducted    Right Shoulder Flexion  150 Degrees 145    Right Shoulder ABduction  145 Degrees 140    Right Shoulder Internal Rotation  90 Degrees 90    Right Shoulder External Rotation  60 Degrees 45    Left Shoulder Flexion  140 Degrees 114    Left Shoulder ABduction  142 Degrees 91    Left Shoulder Internal Rotation  90 Degrees 90    Left Shoulder External Rotation  35 Degrees 49    Cervical Flexion  45 45     Cervical Extension  45 45    Cervical - Right Side Bend  40 29    Cervical - Left Side Bend  40 40    Cervical - Right Rotation  70 32    Cervical - Left Rotation  80 65      PROM   Overall PROM Comments  wfl      Strength   Right Shoulder Flexion  4+/5 4/5    Right Shoulder ABduction  4+/5 4/5    Right Shoulder Internal Rotation  4+/5 4/5    Right Shoulder External Rotation  4+/5 4-/5    Left Shoulder Flexion  4/5 4-/5    Left Shoulder ABduction  4/5 4-/5    Left Shoulder Internal Rotation  4/5 4/5    Left Shoulder External Rotation  4/5 4-/5               OT Treatments/Exercises (OP) - 04/20/18 0001      Exercises   Exercises  Shoulder;Neck      Shoulder Exercises: Supine   Protraction  PROM;10 reps    Horizontal ABduction  PROM;10 reps    External Rotation  PROM;10 reps    Internal Rotation  PROM;10 reps    Flexion  PROM;10 reps    ABduction  PROM;10 reps      Shoulder Exercises: Seated   Protraction  AROM;10 reps    Horizontal ABduction  AROM;10 reps    External Rotation  AROM;10 reps    Internal Rotation  AROM;10 reps    Flexion  AROM;10 reps    Abduction  AROM;10 reps      Shoulder Exercises: Standing   Extension  Theraband;10 reps    Theraband Level (Shoulder Extension)  Level 2 (Red)    Row  Theraband;10 reps    Theraband Level (Shoulder Row)  Level 2 (Red)    Retraction  Theraband;10 reps    Theraband Level (Shoulder Retraction)  Level 2 (Red)      Shoulder Exercises: ROM/Strengthening   UBE (Upper Arm Bike)  3' in reverse at 2.5-3.5 speed level 1.0       Manual Therapy   Manual Therapy  Myofascial release    Manual therapy comments  Manual therapy completed prior to exercises    Myofascial Release  Myofascial release and manual stretching completed to BUE upper trapezius and scapularis region as well as bilateral cervical region to increase joint mobility and ROM in a pain free zone.             OT Education - 04/20/18 0931     Education provided  Yes    Education Details  A/ROM in standing  Person(s) Educated  Patient    Methods  Explanation;Demonstration;Handout;Verbal cues    Comprehension  Verbalized understanding;Returned demonstration       OT Short Term Goals - 04/14/18 1506      OT SHORT TERM GOAL #1   Title  Pt will be provided with and educated on HEP to improve BUE and cervical neck functional use during ADLs.     Time  4    Period  Weeks    Status  On-going      OT SHORT TERM GOAL #2   Title  Pt will decrease pain in BUE and cervial neck to 3/10 or less to improve ability to sleep.     Time  4    Period  Weeks    Status  On-going      OT SHORT TERM GOAL #3   Title  Pt will decrease fascial restrictions in BUE and cervical neck to minimal amounts to improve mobility required for functional reaching tasks.     Time  4    Period  Weeks    Status  On-going      OT SHORT TERM GOAL #4   Title  Pt will improve BUE ROM to WNL to increase ability to perform dressing tasks without compensatory strategies.     Time  4    Period  Weeks    Status  On-going      OT SHORT TERM GOAL #5   Title  Pt will increase cervical neck ROM to [facility] to improve ability to turn head when driving.     Time  4    Period  Weeks    Status  On-going      OT SHORT TERM GOAL #6   Title  Pt will improve BUE strength to 4+/5 to improve ability to lift pots and pans during meal preparation tasks.     Time  4    Period  Weeks    Status  On-going               Plan - 04/20/18 1325    Clinical Impression Statement  A:  patient has made significant improvements in her a/rom in bilateral shoulders.  Patient continues to have end range tightness and pain, therefore, manual therapy intervention is continued this date.  Patient has difficulty reaching overhead and lifting heavy items with her left arm.      Plan  P:  Continue manual therapy to decrease pain and tightness in end range movements.  Increase a/rom  repetitions and add scapular stability exercises such as ball on wall and proximal shoulder strengthening in supine and seated.     OT Home Exercise Plan  4/11: shoulder stretches; 04/20/18:  shoulder A/ROM in standing     Consulted and Agree with Plan of Care  Patient       Patient will benefit from skilled therapeutic intervention in order to improve the following deficits and impairments:  Decreased activity tolerance, Decreased strength, Impaired flexibility, Decreased range of motion, Pain, Impaired UE functional use, Increased fascial restrictions  Visit Diagnosis: Chronic left shoulder pain  Chronic right shoulder pain  Other symptoms and signs involving the musculoskeletal system    Problem List Patient Active Problem List   Diagnosis Date Noted  . Left hemiparesis ([place]) 11/17/2016  . Idiopathic hypotension   . Cognitive deficit, post-stroke   . CVA (cerebral vascular accident) ([place]) 10/29/2016  . Stroke due to embolism of middle cerebral artery ([place]) 10/29/2016  .  Seizure prophylaxis   . History of supraventricular tachycardia   . Neurologic abnormality   . Stroke syndrome   . Vascular headache   . Other secondary hypertension   . Hypokalemia   . AKI (acute kidney injury) ([place])   . Lymphocytosis   . Lethargy   . Brain edema ([place])   . Intracranial aneurysm 07/09/2016  . Communicating hydrocephalus 02/03/2016  . Essential hypertension   . Paroxysmal SVT (supraventricular tachycardia) ([place])   . Paroxysmal atrial fibrillation (HCC)   . Tobacco abuse   . Tachypnea   . Bradycardia   . Leukocytosis   . Acute blood loss anemia   . Thrombocytosis ([place])   . Chest pain   . Nontraumatic subarachnoid hemorrhage ([place])   . Acute respiratory failure ([place])   . Encounter for central line placement   . Hypoxemia   . Renal insufficiency   . Encounter for imaging study to confirm nasogastric (NG) tube placement   . Endotracheally intubated   . Respiratory failure ([place])   .  SAH (subarachnoid hemorrhage) ([place])   . Subarachnoid hemorrhage due to ruptured aneurysm ([place]) 01/03/2016  . Subarachnoid hemorrhage ([place]) 01/03/2016  . Atrial fibrillation with rapid ventricular response ([place])   . Elevated lactic acid level   . SMOKER 03/20/2009  . HYPERTENSION, UNSPECIFIED 03/20/2009  . WPW 03/20/2009  . SVT/ PSVT/ PAT 03/20/2009  . BRONCHITIS 03/20/2009    Vangie Bicker, [place], OTR/L [phone]  04/20/2018, 1:34 PM  [facility] [address] [place], [place], [address] Phone: [phone]   Fax:  [phone]  Name: [name] [name] MRN: [id number] Date of Birth: [birth year]

## 2018-04-20 NOTE — Patient Instructions (Signed)
COMPLETE 2 TIMES PER DAY, 10 TIMES EACH EXERCISE  ROM: Abduction (Standing)   Bring arms straight out from sides and raise as high as possible without pain. Repeat ____ times per set. Do ____ sets per session. Do ____ sessions per day.  http://orth.exer.us/910   Copyright  VHI. All rights reserved.   Extension (Active) ROM: Extension (Standing)   Bring arms straight back as far as possible without pain. Repeat ____ times per set. Do ____ sets per session. Do ____ sessions per day.  http://orth.exer.us/916   Copyright  VHI. All rights reserved.   ROM: External / Internal Rotation - in Abduction (Standing)   With upper arms parallel to floor and elbows bent at right angles, gently rotate arms up then down as far as possible without pain. Repeat ____ times per set. Do ____ sets per session. Do ____ sessions per day.  http://orth.exer.us/912   Copyright  VHI. All rights reserved.    Flexors Stretch (Active)   Stand, arms straight at sides. Bring arms straight forward and upward as high as possible without pain. Hold ___ seconds. Repeat ___ times per session. Do ___ sessions per day.  Copyright  VHI. All rights reserved.   Scapular Retraction (Standing)   With arms at sides, pinch shoulder blades together. Repeat ____ times per set. Do ____ sets per session. Do ____ sessions per day.  http://orth.exer.us/944   Copyright  VHI. All rights reserved.

## 2018-04-22 ENCOUNTER — Telehealth (HOSPITAL_COMMUNITY): Payer: Self-pay | Admitting: Family Medicine

## 2018-04-22 ENCOUNTER — Ambulatory Visit (HOSPITAL_COMMUNITY): Payer: Medicaid Other | Admitting: Occupational Therapy

## 2018-04-22 NOTE — Telephone Encounter (Signed)
04/22/18  Pt left a message to cancel the 5/2 appt and said she wanted to move to another day.  She is scheduled right now for 2 more week and I left a message saying we could add to the end of her schedule if she'd like.  I asked her to either call us or let us know at her next appt.

## 2018-04-22 NOTE — Telephone Encounter (Signed)
04/22/18  pt left a message that she needed to cx today but wants to reschedule to another day

## 2018-04-27 ENCOUNTER — Ambulatory Visit (HOSPITAL_COMMUNITY): Payer: Medicaid Other

## 2018-04-27 ENCOUNTER — Telehealth (HOSPITAL_COMMUNITY): Payer: Self-pay | Admitting: Family Medicine

## 2018-04-27 NOTE — Telephone Encounter (Signed)
04/27/18  pt called at 10:01 to cx said she wouldn't be able to make appt.

## 2018-04-29 ENCOUNTER — Ambulatory Visit (HOSPITAL_COMMUNITY): Payer: Medicaid Other | Attending: Orthopedic Surgery | Admitting: Occupational Therapy

## 2018-04-29 ENCOUNTER — Telehealth (HOSPITAL_COMMUNITY): Payer: Self-pay | Admitting: Occupational Therapy

## 2018-04-29 DIAGNOSIS — M25511 Pain in right shoulder: Secondary | ICD-10-CM | POA: Insufficient documentation

## 2018-04-29 DIAGNOSIS — G8929 Other chronic pain: Secondary | ICD-10-CM | POA: Insufficient documentation

## 2018-04-29 DIAGNOSIS — R29898 Other symptoms and signs involving the musculoskeletal system: Secondary | ICD-10-CM | POA: Insufficient documentation

## 2018-04-29 DIAGNOSIS — M25512 Pain in left shoulder: Secondary | ICD-10-CM | POA: Insufficient documentation

## 2018-04-29 NOTE — Telephone Encounter (Signed)
Called and left message for pt regarding no-show at 11:15. Reminded on next appt on 5/14 at 11:15 and asked to call if unable to make appt.    Guadelupe Sabin, OTR/L  [phone] 04/29/2018

## 2018-05-04 ENCOUNTER — Other Ambulatory Visit: Payer: Self-pay

## 2018-05-04 ENCOUNTER — Encounter (HOSPITAL_COMMUNITY): Payer: Self-pay

## 2018-05-04 ENCOUNTER — Ambulatory Visit (HOSPITAL_COMMUNITY): Payer: Medicaid Other

## 2018-05-04 DIAGNOSIS — R29898 Other symptoms and signs involving the musculoskeletal system: Secondary | ICD-10-CM

## 2018-05-04 DIAGNOSIS — M25511 Pain in right shoulder: Secondary | ICD-10-CM

## 2018-05-04 DIAGNOSIS — G8929 Other chronic pain: Secondary | ICD-10-CM | POA: Diagnosis present

## 2018-05-04 DIAGNOSIS — M25512 Pain in left shoulder: Secondary | ICD-10-CM | POA: Diagnosis present

## 2018-05-04 NOTE — Therapy (Signed)
[place], [place], [address] Phone: [phone]   Fax:  [phone]  Occupational Therapy Treatment  Patient Details  Name: [name] [name] MRN: [id number] Date of Birth: 1961/01/30 Referring Provider: Dr. Arther Abbott   Encounter Date: 05/04/2018  OT End of Session - 05/04/18 1145    Visit Number  4    Number of Visits  8    Date for OT Re-Evaluation  06/03/18    Authorization Type  Medicaid    Authorization Time Period  Medicaid approved 12 visits (04/23/18-06/03/18)    Authorization - Visit Number  1    Authorization - Number of Visits  12    OT Start Time  1115    OT Stop Time  1200    OT Time Calculation (min)  45 min    Activity Tolerance  Patient tolerated treatment well    Behavior During Therapy  [facility] for tasks assessed/performed       Past Medical History:  Diagnosis Date  . Arthritis   . Brain aneurysm   . Headache   . Hypertension    QUIT TAKING BP MEDS NONE IN 10+ YEARS  . 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 Cristopher Peru)  . Thyroid disease    PROCEDURE FOR THYROID 15 YRS AGO AT [facility]  . WPW (Wolff-Parkinson-White syndrome)     Past Surgical History:  Procedure Laterality Date  . BREAST SURGERY     BX RT BREAST  BENIGN  . CRANIOTOMY N/A 01/04/2016   Procedure: Suboccipital Craniotomy and Cervical one Laminectomy for Clipping of Aneurysm;  Surgeon: Kevan Ny Ditty, MD;  Location: [place] [facility];  Service: Neurosurgery;  Laterality: N/A;  . CRANIOTOMY N/A 07/09/2016   Procedure: Craniotomy for clipping of left middle cerebral artery aneurysm;  Surgeon: Kevan Ny Ditty, MD;  Location: [place] [facility];  Service: Neurosurgery;  Laterality: N/A;  Craniotomy for clipping of left middle cerebral artery aneurysm  . 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 Christella Noa;  Surgeon: Kevan Ny Ditty, MD;  Location: [place];  Service: Neurosurgery;  Laterality: Right;  . ELECTROPHYSIOLOGIC STUDY    . LAPAROSCOPIC REVISION VENTRICULAR-PERITONEAL (V-P) SHUNT N/A 02/04/2016   Procedure: LAPAROSCOPIC Insertion VENTRICULAR-PERITONEAL (V-P) SHUNT;  Surgeon: Rolm Bookbinder, MD;  Location: [place] [facility];  Service: General;  Laterality: N/A;  . RADIOLOGY WITH ANESTHESIA N/A 01/03/2016   Procedure: RADIOLOGY WITH ANESTHESIA;  Surgeon: Medication Radiologist, MD;  Location: [place];  Service: Radiology;  Laterality: N/A;  . VENTRICULOPERITONEAL SHUNT Right 02/04/2016   Procedure: SHUNT INSERTION VENTRICULAR-PERITONEAL With Laparoscopic Assistance;  Surgeon: Kevan Ny Ditty, MD;  Location: [place] [facility];  Service: Neurosurgery;  Laterality: Right;    There were no vitals filed for this visit.  Subjective Assessment - 05/04/18 1118    Subjective   S: My left is hurting more than the right.     Currently in Pain?  Yes    Pain Score  5     Pain Location  Shoulder    Pain Orientation  Left    Pain Descriptors / Indicators  Sharp    Pain Type  Chronic pain    Pain Radiating Towards  N/A    Pain Onset  More than a month ago    Pain Frequency  Occasional    Aggravating Factors   move to left ,  getting out of the car, putting in shirt, getting up from a chair.     Pain Relieving Factors  rest, pain medication    Effect of Pain on Daily Activities  Min effect    Multiple Pain Sites  No         [facility] OT Assessment - 05/04/18 1142      Assessment   Medical Diagnosis  neck, bilateral shoulder pain      Precautions   Precautions  None               OT Treatments/Exercises (OP) - 05/04/18 1142      Exercises   Exercises  Shoulder;Neck      Shoulder Exercises: Supine   Protraction  PROM;10 reps    Horizontal ABduction  PROM;10 reps    External Rotation  PROM;10 reps    Internal Rotation  PROM;10 reps    Flexion  PROM;10 reps     ABduction  PROM;10 reps      Shoulder Exercises: Standing   Protraction  AROM;12 reps    Horizontal ABduction  AROM;12 reps    External Rotation  AROM;12 reps    Internal Rotation  AROM;12 reps    Flexion  AROM;12 reps    ABduction  AROM;12 reps    Extension  Theraband;10 reps    Theraband Level (Shoulder Extension)  Level 2 (Red)    Row  Theraband;10 reps    Theraband Level (Shoulder Row)  Level 2 (Red)    Retraction  Theraband;10 reps    Theraband Level (Shoulder Retraction)  Level 2 (Red)      Shoulder Exercises: ROM/Strengthening   UBE (Upper Arm Bike)  3' reverse only level 1  pace; 3.0-4.0    Proximal Shoulder Strengthening, Seated  10X no rest breaks    Other ROM/Strengthening Exercises  Y arms facing wall; 10X      Manual Therapy   Manual Therapy  Myofascial release    Manual therapy comments  Manual therapy completed prior to exercises    Myofascial Release  Myofascial release and manual stretching completed to BUE upper trapezius and scapularis region as well as bilateral cervical region to increase joint mobility and ROM in a pain free zone.             OT Education - 05/04/18 1155    Education provided  Yes    Education Details  re-print of upper trapezious seated stretch provided.     Person(s) Educated  Patient    Methods  Handout    Comprehension  Verbalized understanding       OT Short Term Goals - 04/14/18 1506      OT SHORT TERM GOAL #1   Title  Pt will be provided with and educated on HEP to improve BUE and cervical neck functional use during ADLs.     Time  4    Period  Weeks    Status  On-going      OT SHORT TERM GOAL #2   Title  Pt will decrease pain in BUE and cervial neck to 3/10 or less to improve ability to sleep.     Time  4    Period  Weeks    Status  On-going      OT SHORT TERM GOAL #3   Title  Pt will decrease fascial restrictions in BUE and cervical neck to minimal amounts to improve mobility required for functional reaching  tasks.     Time  4    Period  Weeks    Status  On-going      OT SHORT TERM GOAL #4   Title  Pt will improve BUE ROM to WNL to increase ability to perform dressing tasks without compensatory strategies.     Time  4    Period  Weeks    Status  On-going      OT SHORT TERM GOAL #5   Title  Pt will increase cervical neck ROM to [facility] to improve ability to turn head when driving.     Time  4    Period  Weeks    Status  On-going      OT SHORT TERM GOAL #6   Title  Pt will improve BUE strength to 4+/5 to improve ability to lift pots and pans during meal preparation tasks.     Time  4    Period  Weeks    Status  On-going               Plan - 05/04/18 1211    Clinical Impression Statement  A: Patient arrives today with left shoulder more painful than right shoulder. She reports that her right shoulder and neck have shown improvements. She is not noticing as much pain on the right shoulder and through her cervical region. Her passive and active ROM on the Right side have greatly improved. Left shoulder continues to have joint mobility limitations during passive an active ROM. Equal fascial restrictions in bilateral upper shoulder region. Manual therapy techniques completed to address. VC for form and technique especially with head positioning when completing standing exercises.     OT Frequency  2x / week    OT Duration  4 weeks    Plan  P: Continue skilled OT services to address deficits mentioned above for 4 more weeks. Continue with manual therapy techniques to decrease pain and increase joint mobility. Focus on scapular strengthening and posture during exercises. Attempt ball on the wall. Provide scapular theraband to HEP for scapular strengthening.     Consulted and Agree with Plan of Care  Patient       Patient will benefit from skilled therapeutic intervention in order to improve the following deficits and impairments:  Decreased activity tolerance, Decreased strength, Impaired  flexibility, Decreased range of motion, Pain, Impaired UE functional use, Increased fascial restrictions  Visit Diagnosis: Chronic left shoulder pain - Plan: Ot plan of care cert/re-cert  Chronic right shoulder pain - Plan: Ot plan of care cert/re-cert  Other symptoms and signs involving the musculoskeletal system - Plan: Ot plan of care cert/re-cert    Problem List Patient Active Problem List   Diagnosis Date Noted  . Left hemiparesis ([place]) 11/17/2016  . Idiopathic hypotension   . Cognitive deficit, post-stroke   . CVA (cerebral vascular accident) ([place]) 10/29/2016  . Stroke due to embolism of middle cerebral artery ([place]) 10/29/2016  . Seizure prophylaxis   . History of supraventricular tachycardia   . Neurologic abnormality   . Stroke syndrome   . Vascular headache   . Other secondary hypertension   . Hypokalemia   . AKI (acute kidney injury) ([place])   . Lymphocytosis   . Lethargy   . Brain edema ([place])   . Intracranial aneurysm 07/09/2016  . Communicating hydrocephalus 02/03/2016  . Essential hypertension   . Paroxysmal SVT (supraventricular tachycardia) ([place])   . Paroxysmal atrial fibrillation (HCC)   . Tobacco abuse   . Tachypnea   . Bradycardia   .  Leukocytosis   . Acute blood loss anemia   . Thrombocytosis ([place])   . Chest pain   . Nontraumatic subarachnoid hemorrhage ([place])   . Acute respiratory failure ([place])   . Encounter for central line placement   . Hypoxemia   . Renal insufficiency   . Encounter for imaging study to confirm nasogastric (NG) tube placement   . Endotracheally intubated   . Respiratory failure ([place])   . SAH (subarachnoid hemorrhage) ([place])   . Subarachnoid hemorrhage due to ruptured aneurysm ([place]) 01/03/2016  . Subarachnoid hemorrhage ([place]) 01/03/2016  . Atrial fibrillation with rapid ventricular response ([place])   . Elevated lactic acid level   . SMOKER 03/20/2009  . HYPERTENSION, UNSPECIFIED 03/20/2009  . WPW 03/20/2009  . SVT/ PSVT/ PAT  03/20/2009  . BRONCHITIS 03/20/2009   Ailene Ravel, OTR/L,CBIS  [phone]  05/04/2018, 12:17 PM  [facility] [address] [place], [place], [address] Phone: [phone]   Fax:  [phone]  Name: KANDI BRUSSEAU MRN: [id number] Date of Birth: 06-13-1961

## 2018-05-06 ENCOUNTER — Encounter (HOSPITAL_COMMUNITY): Payer: Self-pay

## 2018-05-06 ENCOUNTER — Ambulatory Visit (HOSPITAL_COMMUNITY): Payer: Medicaid Other

## 2018-05-06 ENCOUNTER — Other Ambulatory Visit: Payer: Self-pay

## 2018-05-06 DIAGNOSIS — M25512 Pain in left shoulder: Secondary | ICD-10-CM | POA: Diagnosis not present

## 2018-05-06 DIAGNOSIS — M25511 Pain in right shoulder: Secondary | ICD-10-CM

## 2018-05-06 DIAGNOSIS — G8929 Other chronic pain: Secondary | ICD-10-CM

## 2018-05-06 DIAGNOSIS — R29898 Other symptoms and signs involving the musculoskeletal system: Secondary | ICD-10-CM

## 2018-05-06 NOTE — Therapy (Signed)
[place], [place], [address] Phone: [phone]   Fax:  [phone]  Occupational Therapy Treatment  Patient Details  Name: [name] [name] MRN: [id number] Date of Birth: 03/25/1961 Referring Provider: Dr. Arther Abbott   Encounter Date: 05/06/2018  OT End of Session - 05/06/18 1148    Visit Number  5    Number of Visits  8    Date for OT Re-Evaluation  06/03/18    Authorization Type  Medicaid    Authorization Time Period  Medicaid approved 12 visits (04/23/18-06/03/18)    Authorization - Visit Number  2    Authorization - Number of Visits  12    OT Start Time  1120    OT Stop Time  1200    OT Time Calculation (min)  40 min    Activity Tolerance  Patient tolerated treatment well    Behavior During Therapy  [facility] for tasks assessed/performed       Past Medical History:  Diagnosis Date  . Arthritis   . Brain aneurysm   . Headache   . Hypertension    QUIT TAKING BP MEDS NONE IN 10+ YEARS  . 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 Cristopher Peru)  . Thyroid disease    PROCEDURE FOR THYROID 15 YRS AGO AT [facility]  . WPW (Wolff-Parkinson-White syndrome)     Past Surgical History:  Procedure Laterality Date  . BREAST SURGERY     BX RT BREAST  BENIGN  . CRANIOTOMY N/A 01/04/2016   Procedure: Suboccipital Craniotomy and Cervical one Laminectomy for Clipping of Aneurysm;  Surgeon: Kevan Ny Ditty, MD;  Location: [place] [facility];  Service: Neurosurgery;  Laterality: N/A;  . CRANIOTOMY N/A 07/09/2016   Procedure: Craniotomy for clipping of left middle cerebral artery aneurysm;  Surgeon: Kevan Ny Ditty, MD;  Location: [place] [facility];  Service: Neurosurgery;  Laterality: N/A;  Craniotomy for clipping of left middle cerebral artery aneurysm  . 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 Christella Noa;  Surgeon: Kevan Ny Ditty, MD;  Location: [place];  Service: Neurosurgery;  Laterality: Right;  . ELECTROPHYSIOLOGIC STUDY    . LAPAROSCOPIC REVISION VENTRICULAR-PERITONEAL (V-P) SHUNT N/A 02/04/2016   Procedure: LAPAROSCOPIC Insertion VENTRICULAR-PERITONEAL (V-P) SHUNT;  Surgeon: Rolm Bookbinder, MD;  Location: [place] [facility];  Service: General;  Laterality: N/A;  . RADIOLOGY WITH ANESTHESIA N/A 01/03/2016   Procedure: RADIOLOGY WITH ANESTHESIA;  Surgeon: Medication Radiologist, MD;  Location: [place];  Service: Radiology;  Laterality: N/A;  . VENTRICULOPERITONEAL SHUNT Right 02/04/2016   Procedure: SHUNT INSERTION VENTRICULAR-PERITONEAL With Laparoscopic Assistance;  Surgeon: Kevan Ny Ditty, MD;  Location: [place] [facility];  Service: Neurosurgery;  Laterality: Right;    There were no vitals filed for this visit.  Subjective Assessment - 05/06/18 1148    Subjective   S: I took the muscle relaxer this morning and it's helping.     Currently in Pain?  Yes    Pain Score  3     Pain Location  Shoulder    Pain Orientation  Left    Pain Descriptors / Indicators  Sharp    Pain Type  Chronic pain         [facility] OT Assessment - 05/06/18 1146      Assessment   Medical Diagnosis  neck, bilateral shoulder pain      Precautions  Precautions  None               OT Treatments/Exercises (OP) - 05/06/18 1146      Exercises   Exercises  Shoulder      Shoulder Exercises: Supine   Protraction  PROM;5 reps    Horizontal ABduction  PROM;5 reps    External Rotation  PROM;5 reps    Internal Rotation  PROM;5 reps    Flexion  PROM;5 reps    ABduction  PROM;5 reps      Shoulder Exercises: Standing   Extension  Theraband;10 reps    Theraband Level (Shoulder Extension)  Level 3 (Green)    Row  [employer] reps    Theraband Level (Shoulder Row)  Level 3 (Green)    Retraction  Theraband;10 reps    Theraband Level (Shoulder Retraction)  Level 3 (Green)      Shoulder  Exercises: ROM/Strengthening   Ball on Wall  1' flexion 1' abduction both arms green ball      Manual Therapy   Manual Therapy  Myofascial release    Manual therapy comments  Manual therapy completed prior to exercises    Myofascial Release  Myofascial release and manual stretching completed to BUE upper trapezius and scapularis region as well as bilateral cervical region to increase joint mobility and ROM in a pain free zone.             OT Education - 05/06/18 1153    Education provided  Yes    Education Details  green band scapular strengthening    Person(s) Educated  Patient    Methods  Explanation;Verbal cues;Tactile cues;Handout;Demonstration    Comprehension  Verbalized understanding;Returned demonstration       OT Short Term Goals - 04/14/18 1506      OT SHORT TERM GOAL #1   Title  Pt will be provided with and educated on HEP to improve BUE and cervical neck functional use during ADLs.     Time  4    Period  Weeks    Status  On-going      OT SHORT TERM GOAL #2   Title  Pt will decrease pain in BUE and cervial neck to 3/10 or less to improve ability to sleep.     Time  4    Period  Weeks    Status  On-going      OT SHORT TERM GOAL #3   Title  Pt will decrease fascial restrictions in BUE and cervical neck to minimal amounts to improve mobility required for functional reaching tasks.     Time  4    Period  Weeks    Status  On-going      OT SHORT TERM GOAL #4   Title  Pt will improve BUE ROM to WNL to increase ability to perform dressing tasks without compensatory strategies.     Time  4    Period  Weeks    Status  On-going      OT SHORT TERM GOAL #5   Title  Pt will increase cervical neck ROM to [facility] to improve ability to turn head when driving.     Time  4    Period  Weeks    Status  On-going      OT SHORT TERM GOAL #6   Title  Pt will improve BUE strength to 4+/5 to improve ability to lift pots and pans during meal preparation tasks.     Time  4  Period  Weeks    Status  On-going               Plan - 05/06/18 1149    Clinical Impression Statement  A: Patient reports a decrease in pain level than previous session. patient continues to have increased fascial restrictions in bilateral upper trapezius region with manual therapy completed to address. Added ball on the wall for scapular and shoulder strengthening. VC for form and technique.     Plan  P: Continue to focus on scapular and shoulder strengthening to increase posture and allow for a decrease in pain level.        Patient will benefit from skilled therapeutic intervention in order to improve the following deficits and impairments:  Decreased activity tolerance, Decreased strength, Impaired flexibility, Decreased range of motion, Pain, Impaired UE functional use, Increased fascial restrictions  Visit Diagnosis: Chronic left shoulder pain  Chronic right shoulder pain  Other symptoms and signs involving the musculoskeletal system    Problem List Patient Active Problem List   Diagnosis Date Noted  . Left hemiparesis ([place]) 11/17/2016  . Idiopathic hypotension   . Cognitive deficit, post-stroke   . CVA (cerebral vascular accident) ([place]) 10/29/2016  . Stroke due to embolism of middle cerebral artery ([place]) 10/29/2016  . Seizure prophylaxis   . History of supraventricular tachycardia   . Neurologic abnormality   . Stroke syndrome   . Vascular headache   . Other secondary hypertension   . Hypokalemia   . AKI (acute kidney injury) ([place])   . Lymphocytosis   . Lethargy   . Brain edema ([place])   . Intracranial aneurysm 07/09/2016  . Communicating hydrocephalus 02/03/2016  . Essential hypertension   . Paroxysmal SVT (supraventricular tachycardia) ([place])   . Paroxysmal atrial fibrillation (HCC)   . Tobacco abuse   . Tachypnea   . Bradycardia   . Leukocytosis   . Acute blood loss anemia   . Thrombocytosis ([place])   . Chest pain   . Nontraumatic subarachnoid  hemorrhage ([place])   . Acute respiratory failure ([place])   . Encounter for central line placement   . Hypoxemia   . Renal insufficiency   . Encounter for imaging study to confirm nasogastric (NG) tube placement   . Endotracheally intubated   . Respiratory failure ([place])   . SAH (subarachnoid hemorrhage) ([place])   . Subarachnoid hemorrhage due to ruptured aneurysm ([place]) 01/03/2016  . Subarachnoid hemorrhage ([place]) 01/03/2016  . Atrial fibrillation with rapid ventricular response ([place])   . Elevated lactic acid level   . SMOKER 03/20/2009  . HYPERTENSION, UNSPECIFIED 03/20/2009  . WPW 03/20/2009  . SVT/ PSVT/ PAT 03/20/2009  . BRONCHITIS 03/20/2009   Ailene Ravel, OTR/L,CBIS  [phone]  05/06/2018, 4:18 PM  [facility] [address] [place], [place], [address] Phone: [phone]   Fax:  [phone]  Name: [name] [name] MRN: [id number] Date of Birth: 1961/12/18

## 2018-05-06 NOTE — Patient Instructions (Signed)

## 2018-05-11 ENCOUNTER — Other Ambulatory Visit (HOSPITAL_COMMUNITY): Payer: Self-pay | Admitting: Neurosurgery

## 2018-05-12 ENCOUNTER — Telehealth (HOSPITAL_COMMUNITY): Payer: Self-pay

## 2018-05-12 ENCOUNTER — Ambulatory Visit (HOSPITAL_COMMUNITY): Payer: Medicaid Other

## 2018-05-12 NOTE — Telephone Encounter (Signed)
Pt cancelled apt.  Reports her grandson is having surgery today.  Reminded next apt date and time.    [address] [place]; CBIS [phone]

## 2018-05-14 ENCOUNTER — Telehealth (HOSPITAL_COMMUNITY): Payer: Self-pay | Admitting: Occupational Therapy

## 2018-05-14 ENCOUNTER — Ambulatory Visit (HOSPITAL_COMMUNITY): Payer: Medicaid Other | Admitting: Occupational Therapy

## 2018-05-14 NOTE — Telephone Encounter (Signed)
Called pt regarding no-show, pt reports she did not have a babysitter. Reminded of next appt on 5/29 at 4:45.    Guadelupe Sabin, OTR/L  [phone] 05/14/2018

## 2018-05-19 ENCOUNTER — Ambulatory Visit (HOSPITAL_COMMUNITY): Payer: Medicaid Other

## 2018-05-19 ENCOUNTER — Encounter (HOSPITAL_COMMUNITY): Payer: Self-pay

## 2018-05-19 ENCOUNTER — Other Ambulatory Visit: Payer: Self-pay

## 2018-05-19 DIAGNOSIS — M25512 Pain in left shoulder: Principal | ICD-10-CM

## 2018-05-19 DIAGNOSIS — M25511 Pain in right shoulder: Secondary | ICD-10-CM

## 2018-05-19 DIAGNOSIS — G8929 Other chronic pain: Secondary | ICD-10-CM

## 2018-05-19 DIAGNOSIS — R29898 Other symptoms and signs involving the musculoskeletal system: Secondary | ICD-10-CM

## 2018-05-19 NOTE — Therapy (Addendum)
[place], [place], [address] Phone: [phone]   Fax:  [phone]  Occupational Therapy Treatment  Patient Details  Name: [name] [name] MRN: [id number] Date of Birth: 1961/10/27 Referring Provider: Dr. Arther Abbott   Encounter Date: 05/19/2018  OT End of Session - 05/19/18 1311    Visit Number  6    Number of Visits  8    Date for OT Re-Evaluation  06/03/18    Authorization Type  Medicaid    Authorization Time Period  Medicaid approved 12 visits (04/23/18-06/03/18)    Authorization - Visit Number  3    Authorization - Number of Visits  12    OT Start Time  5366    OT Stop Time  1345    OT Time Calculation (min)  42 min    Activity Tolerance  Patient tolerated treatment well    Behavior During Therapy  [facility] for tasks assessed/performed       Past Medical History:  Diagnosis Date  . Arthritis   . Brain aneurysm   . Headache   . Hypertension    QUIT TAKING BP MEDS NONE IN 10+ YEARS  . 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 Cristopher Peru)  . Thyroid disease    PROCEDURE FOR THYROID 15 YRS AGO AT [facility]  . WPW (Wolff-Parkinson-White syndrome)     Past Surgical History:  Procedure Laterality Date  . BREAST SURGERY     BX RT BREAST  BENIGN  . CRANIOTOMY N/A 01/04/2016   Procedure: Suboccipital Craniotomy and Cervical one Laminectomy for Clipping of Aneurysm;  Surgeon: Kevan Ny Ditty, MD;  Location: [place] [facility];  Service: Neurosurgery;  Laterality: N/A;  . 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 Christella Noa;  Surgeon: Kevan Ny Ditty, MD;  Location: [place];  Service: Neurosurgery;  Laterality: Right;  . ELECTROPHYSIOLOGIC STUDY    . LAPAROSCOPIC REVISION VENTRICULAR-PERITONEAL (V-P) SHUNT N/A 02/04/2016   Procedure: LAPAROSCOPIC Insertion VENTRICULAR-PERITONEAL (V-P) SHUNT;  Surgeon: Rolm Bookbinder, MD;  Location: [place] [facility];  Service: General;  Laterality: N/A;  . RADIOLOGY WITH ANESTHESIA N/A 01/03/2016   Procedure: RADIOLOGY WITH ANESTHESIA;  Surgeon: Medication Radiologist, MD;  Location: [place];  Service: Radiology;  Laterality: N/A;  . VENTRICULOPERITONEAL SHUNT Right 02/04/2016   Procedure: SHUNT INSERTION VENTRICULAR-PERITONEAL With Laparoscopic Assistance;  Surgeon: Kevan Ny Ditty, MD;  Location: [place] [facility];  Service: Neurosurgery;  Laterality: Right;    There were no vitals filed for this visit.  Subjective Assessment - 05/19/18 1306    Subjective   S: It's been hurting more on the left.    Currently in Pain?  Yes    Pain Score  6     Pain Location  Shoulder    Pain Orientation  Left    Pain Descriptors / Indicators  Sharp    Pain Type  Chronic pain    Pain Onset  More than a month ago    Pain Frequency  Occasional    Aggravating Factors   movement, getting dressed, getting out of a chair    Pain Relieving Factors  rest, pain medication    Effect of Pain on Daily Activities  min effect    Multiple Pain Sites  No         [facility] OT Assessment - 05/19/18 1309      Assessment   Medical Diagnosis  neck, bilateral shoulder pain      Precautions   Precautions  None               OT Treatments/Exercises (OP) - 05/19/18 1310      Exercises   Exercises  Shoulder      Shoulder Exercises: Supine   Protraction  PROM;5 reps P/ROM completed on both shoulders    Horizontal ABduction  PROM;5 reps    External Rotation  PROM;5 reps    Internal Rotation  PROM;5 reps    Flexion  PROM;5 reps    ABduction  PROM;5 reps      Shoulder Exercises:  Sidelying   External Rotation  AROM;10 reps;Left    Internal Rotation  AROM;10 reps;Left    Flexion  AROM;10 reps;Left    ABduction  AROM;10 reps;Left      Shoulder Exercises: Standing   Protraction  AROM;12 reps    Horizontal ABduction  AROM;12 reps    External Rotation  AROM;12 reps    Internal Rotation  AROM;12 reps    Flexion  AROM;12 reps    ABduction  AROM;12 reps    Extension  Theraband;10 reps    Theraband Level (Shoulder Extension)  Level 3 (Green)    Row  [employer] reps    Theraband Level (Shoulder Row)  Level 3 (Green)    Retraction  Theraband;10 reps    Theraband Level (Shoulder Retraction)  Level 3 (Green)      Shoulder Exercises: ROM/Strengthening   Ball on Wall  1' flexion 1' abduction both arms green ball    Other ROM/Strengthening Exercises  Y arms facing wall; 12X      Manual Therapy   Manual Therapy  Myofascial release    Manual therapy comments  Manual therapy completed prior to exercises    Myofascial Release  Myofascial release and manual stretching completed to BUE upper trapezius and scapularis region as well as bilateral cervical region to increase joint mobility and ROM in a pain free zone.             OT Education - 05/19/18 1308    Education provided  No       OT Short Term Goals - 04/14/18 1506      OT SHORT TERM GOAL #1   Title  Pt will be provided with and educated on HEP to improve BUE and cervical neck functional use during ADLs.     Time  4    Period  Weeks    Status  On-going      OT SHORT TERM GOAL #2   Title  Pt will decrease pain in BUE and cervial neck to 3/10 or less to improve ability to sleep.     Time  4    Period  Weeks    Status  On-going      OT SHORT TERM GOAL #3   Title  Pt will decrease fascial restrictions in BUE and cervical neck to minimal amounts to improve mobility required for functional reaching tasks.     Time  4    Period  Weeks    Status  On-going      OT SHORT TERM GOAL #4   Title  Pt will  improve BUE ROM to WNL to increase ability to perform dressing tasks without compensatory strategies.     Time  4    Period  Weeks    Status  On-going      OT SHORT TERM GOAL #5   Title  Pt will increase cervical neck ROM to [facility] to improve ability to turn head when driving.     Time  4    Period  Weeks    Status  On-going      OT SHORT TERM GOAL #6   Title  Pt will improve BUE strength to 4+/5 to improve ability to lift pots and pans during meal preparation tasks.     Time  4    Period  Weeks    Status  On-going               Plan - 05/19/18 1312    Clinical Impression Statement  A: Patient still maintains moderate pain levels but is demonstrating improved ROM. This session she presented with minimal fascial restrictions and some joint tightness in upper arms and bilateral upper trapezius region with manual therapy completed to address. This is and improvement from last session. Sidelying A/ROM exercises added for left arm due to increased pain and tightness in that side.  Pt required VC for form and technique.    Plan  P: Complete myofascial release as needed to address fascial restrictions. Continue to focus on scapular and shoulder strengthening to increase posture and allow for a decrease in pain level. Discontinue manual stretching on RUE due to full ROM. Continue with manual stretching on LUE.        Patient will benefit from skilled therapeutic intervention in order to improve the following deficits and impairments:  Decreased activity tolerance, Decreased strength, Impaired flexibility, Decreased range of motion, Pain, Impaired UE functional use, Increased fascial restrictions  Visit Diagnosis: Chronic left shoulder pain  Chronic right shoulder pain  Other symptoms and signs involving the musculoskeletal system    Problem List Patient Active Problem List   Diagnosis Date Noted  . Left hemiparesis ([place]) 11/17/2016  . Idiopathic hypotension   . Cognitive  deficit, post-stroke   . CVA (cerebral vascular accident) ([place]) 10/29/2016  . Stroke due to embolism of middle cerebral artery ([place]) 10/29/2016  . Seizure prophylaxis   . History of supraventricular tachycardia   . Neurologic abnormality   . Stroke syndrome   . Vascular headache   . Other secondary hypertension   . Hypokalemia   . AKI (acute kidney injury) ([place])   . Lymphocytosis   . Lethargy   . Brain edema ([place])   . Intracranial aneurysm 07/09/2016  . Communicating hydrocephalus 02/03/2016  . Essential hypertension   . Paroxysmal SVT (supraventricular tachycardia) ([place])   . Paroxysmal atrial fibrillation (HCC)   . Tobacco abuse   . Tachypnea   . Bradycardia   . Leukocytosis   . Acute blood loss anemia   . Thrombocytosis ([place])   . Chest pain   . Nontraumatic subarachnoid hemorrhage ([place])   . Acute respiratory failure ([place])   . Encounter for central line placement   . Hypoxemia   . Renal insufficiency   . Encounter for imaging study to confirm nasogastric (NG) tube placement   . Endotracheally intubated   . Respiratory failure ([place])   . SAH (subarachnoid hemorrhage) ([place])   . Subarachnoid hemorrhage due to ruptured aneurysm ([place]) 01/03/2016  . Subarachnoid hemorrhage ([place]) 01/03/2016  .  Atrial fibrillation with rapid ventricular response ([place])   . Elevated lactic acid level   . SMOKER 03/20/2009  . HYPERTENSION, UNSPECIFIED 03/20/2009  . WPW 03/20/2009  . SVT/ PSVT/ PAT 03/20/2009  . BRONCHITIS 03/20/2009    Roderic Palau, OT student 05/20/2018, 8:43 AM  [facility] [address] [place], [place], [address] Phone: [phone]   Fax:  [phone]  Name: [name] [name] MRN: [id number] Date of Birth: Dec 05, 1961

## 2018-05-20 ENCOUNTER — Ambulatory Visit (HOSPITAL_COMMUNITY): Payer: Medicaid Other

## 2018-05-20 ENCOUNTER — Telehealth (HOSPITAL_COMMUNITY): Payer: Self-pay | Admitting: Family Medicine

## 2018-05-20 NOTE — Telephone Encounter (Signed)
05/20/18  pt called to cx - no reason was given

## 2018-05-21 ENCOUNTER — Encounter (HOSPITAL_COMMUNITY): Payer: Medicaid Other | Admitting: Specialist

## 2018-05-25 ENCOUNTER — Ambulatory Visit (HOSPITAL_COMMUNITY): Payer: Medicaid Other | Attending: Orthopedic Surgery | Admitting: Occupational Therapy

## 2018-05-25 ENCOUNTER — Encounter (HOSPITAL_COMMUNITY): Payer: Self-pay | Admitting: Occupational Therapy

## 2018-05-25 DIAGNOSIS — G8929 Other chronic pain: Secondary | ICD-10-CM | POA: Diagnosis present

## 2018-05-25 DIAGNOSIS — R29898 Other symptoms and signs involving the musculoskeletal system: Secondary | ICD-10-CM | POA: Insufficient documentation

## 2018-05-25 DIAGNOSIS — M25512 Pain in left shoulder: Secondary | ICD-10-CM | POA: Insufficient documentation

## 2018-05-25 DIAGNOSIS — M25511 Pain in right shoulder: Secondary | ICD-10-CM | POA: Diagnosis present

## 2018-05-25 NOTE — Therapy (Signed)
[place], [place], [address] Phone: [phone]   Fax:  [phone]  Occupational Therapy Treatment  Patient Details  Name: [name] [name] MRN: [id number] Date of Birth: [birth year] Referring Provider: Dr. Arther Abbott   Encounter Date: 05/25/2018  OT End of Session - 05/25/18 1146    Visit Number  7    Number of Visits  8    Date for OT Re-Evaluation  06/03/18    Authorization Type  Medicaid    Authorization Time Period  Medicaid approved 12 visits (04/23/18-06/03/18)    Authorization - Visit Number  4    Authorization - Number of Visits  12    OT Start Time  7026    OT Stop Time  1147    OT Time Calculation (min)  42 min    Activity Tolerance  Patient tolerated treatment well    Behavior During Therapy  [facility] for tasks assessed/performed       Past Medical History:  Diagnosis Date  . Arthritis   . Brain aneurysm   . Headache   . Hypertension    QUIT TAKING BP MEDS NONE IN 10+ YEARS  . 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 Cristopher Peru)  . Thyroid disease    PROCEDURE FOR THYROID 15 YRS AGO AT [facility]  . WPW (Wolff-Parkinson-White syndrome)     Past Surgical History:  Procedure Laterality Date  . BREAST SURGERY     BX RT BREAST  BENIGN  . CRANIOTOMY N/A 01/04/2016   Procedure: Suboccipital Craniotomy and Cervical one Laminectomy for Clipping of Aneurysm;  Surgeon: Kevan Ny Ditty, MD;  Location: [place] [facility];  Service: Neurosurgery;  Laterality: N/A;  . 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 Christella Noa;  Surgeon: Kevan Ny Ditty, MD;  Location: [place];  Service: Neurosurgery;  Laterality: Right;  . ELECTROPHYSIOLOGIC STUDY    . LAPAROSCOPIC REVISION VENTRICULAR-PERITONEAL (V-P) SHUNT N/A 02/04/2016   Procedure: LAPAROSCOPIC Insertion VENTRICULAR-PERITONEAL (V-P) SHUNT;  Surgeon: Rolm Bookbinder, MD;  Location: [place] [facility];  Service: General;  Laterality: N/A;  . RADIOLOGY WITH ANESTHESIA N/A 01/03/2016   Procedure: RADIOLOGY WITH ANESTHESIA;  Surgeon: Medication Radiologist, MD;  Location: [place];  Service: Radiology;  Laterality: N/A;  . VENTRICULOPERITONEAL SHUNT Right 02/04/2016   Procedure: SHUNT INSERTION VENTRICULAR-PERITONEAL With Laparoscopic Assistance;  Surgeon: Kevan Ny Ditty, MD;  Location: [place] [facility];  Service: Neurosurgery;  Laterality: Right;    There were no vitals filed for this visit.  Subjective Assessment - 05/25/18 1102    Subjective   S: I took my medicine today.     Currently in Pain?  No/denies         [facility] OT Assessment - 05/25/18 1102      Assessment   Medical Diagnosis  neck, bilateral shoulder pain      Precautions   Precautions  None               OT Treatments/Exercises (OP) - 05/25/18 1103      Exercises   Exercises  Shoulder      Shoulder Exercises: Supine  Protraction  PROM;5 reps;Strengthening;10 reps    Protraction Weight (lbs)  1    Horizontal ABduction  PROM;5 reps;Strengthening;10 reps    Horizontal ABduction Weight (lbs)  1    External Rotation  PROM;5 reps;Strengthening;10 reps    External Rotation Weight (lbs)  1    Internal Rotation  PROM;5 reps;Strengthening;10 reps    Internal Rotation Weight (lbs)  1    Flexion  PROM;5 reps;Strengthening;10 reps    Shoulder Flexion Weight (lbs)  1    ABduction  PROM;5 reps;AROM;10 reps    Shoulder ABduction Weight (lbs)  --      Shoulder Exercises: Sidelying   External Rotation  AROM;10 reps;Left    Internal Rotation  AROM;10 reps;Left    Flexion   AROM;10 reps;Left    ABduction  AROM;10 reps;Left    Other Sidelying Exercises  protraction, left, A/ROM, 10X    Other Sidelying Exercises  horizontal abduction, left, 10X      Shoulder Exercises: Standing   Protraction  AROM;12 reps    Horizontal ABduction  AROM;12 reps    External Rotation  AROM;12 reps    Internal Rotation  AROM;12 reps    Flexion  AROM;12 reps    ABduction  AROM;12 reps    Extension  Theraband;10 reps    Theraband Level (Shoulder Extension)  Level 3 (Green)    Row  [employer] reps    Theraband Level (Shoulder Row)  Level 3 (Green)    Retraction  Theraband;10 reps    Theraband Level (Shoulder Retraction)  Level 3 (Green)      Shoulder Exercises: Therapy Ball   Other Therapy Ball Exercises  chest press, overhead press, 10X with green therapy ball      Shoulder Exercises: ROM/Strengthening   UBE (Upper Arm Bike)  3' reverse only, level 1  pace: 5.0    Over Head Lace  1' left    X to V Arms  12X    Proximal Shoulder Strengthening, Seated  10X each no rest breaks    Ball on Wall  1' flexion 1' abduction both arms green ball               OT Short Term Goals - 04/14/18 1506      OT SHORT TERM GOAL #1   Title  Pt will be provided with and educated on HEP to improve BUE and cervical neck functional use during ADLs.     Time  4    Period  Weeks    Status  On-going      OT SHORT TERM GOAL #2   Title  Pt will decrease pain in BUE and cervial neck to 3/10 or less to improve ability to sleep.     Time  4    Period  Weeks    Status  On-going      OT SHORT TERM GOAL #3   Title  Pt will decrease fascial restrictions in BUE and cervical neck to minimal amounts to improve mobility required for functional reaching tasks.     Time  4    Period  Weeks    Status  On-going      OT SHORT TERM GOAL #4   Title  Pt will improve BUE ROM to WNL to increase ability to perform dressing tasks without compensatory strategies.     Time  4    Period  Weeks    Status   On-going      OT SHORT TERM GOAL #5  Title  Pt will increase cervical neck ROM to [facility] to improve ability to turn head when driving.     Time  4    Period  Weeks    Status  On-going      OT SHORT TERM GOAL #6   Title  Pt will improve BUE strength to 4+/5 to improve ability to lift pots and pans during meal preparation tasks.     Time  4    Period  Weeks    Status  On-going               Plan - 05/25/18 1112    Clinical Impression Statement  A: No myofascial release completed today as pt has trace to minimal fascial restrictions. P/ROM to LUE only, ROM WNL achieved. Added 1# weight in supine for shoulder strengthening, continued with A/ROM in sidelying and sitting, Added x to v arms and green therapy ball exercises for scapular stability and strengthening, also added overhead lacing. Pt requiring verbal cuing for form and technique during exercises, left arm fatiguing quickly during exercises.     Plan  P: Continue working on LUE strengthening and functional use, attempt red theraband strengthening for BUE in standing       Patient will benefit from skilled therapeutic intervention in order to improve the following deficits and impairments:  Decreased activity tolerance, Decreased strength, Impaired flexibility, Decreased range of motion, Pain, Impaired UE functional use, Increased fascial restrictions  Visit Diagnosis: Chronic left shoulder pain  Chronic right shoulder pain  Other symptoms and signs involving the musculoskeletal system    Problem List Patient Active Problem List   Diagnosis Date Noted  . Left hemiparesis ([place]) 11/17/2016  . Idiopathic hypotension   . Cognitive deficit, post-stroke   . CVA (cerebral vascular accident) ([place]) 10/29/2016  . Stroke due to embolism of middle cerebral artery ([place]) 10/29/2016  . Seizure prophylaxis   . History of supraventricular tachycardia   . Neurologic abnormality   . Stroke syndrome   . Vascular headache   . Other  secondary hypertension   . Hypokalemia   . AKI (acute kidney injury) ([place])   . Lymphocytosis   . Lethargy   . Brain edema ([place])   . Intracranial aneurysm 07/09/2016  . Communicating hydrocephalus 02/03/2016  . Essential hypertension   . Paroxysmal SVT (supraventricular tachycardia) ([place])   . Paroxysmal atrial fibrillation (HCC)   . Tobacco abuse   . Tachypnea   . Bradycardia   . Leukocytosis   . Acute blood loss anemia   . Thrombocytosis ([place])   . Chest pain   . Nontraumatic subarachnoid hemorrhage ([place])   . Acute respiratory failure ([place])   . Encounter for central line placement   . Hypoxemia   . Renal insufficiency   . Encounter for imaging study to confirm nasogastric (NG) tube placement   . Endotracheally intubated   . Respiratory failure ([place])   . SAH (subarachnoid hemorrhage) ([place])   . Subarachnoid hemorrhage due to ruptured aneurysm ([place]) 01/03/2016  . Subarachnoid hemorrhage ([place]) 01/03/2016  . Atrial fibrillation with rapid ventricular response ([place])   . Elevated lactic acid level   . SMOKER 03/20/2009  . HYPERTENSION, UNSPECIFIED 03/20/2009  . WPW 03/20/2009  . SVT/ PSVT/ PAT 03/20/2009  . BRONCHITIS 03/20/2009   Guadelupe Sabin, OTR/L  [phone] 05/25/2018, 11:48 AM  [place] [address] [place], [place], [address] Phone: [phone]   Fax:  [phone]  Name: [name] Mckinstry  [name] MRN: [id number] Date of Birth: 13-Nov-1961

## 2018-05-27 ENCOUNTER — Encounter (HOSPITAL_COMMUNITY): Payer: Self-pay | Admitting: Occupational Therapy

## 2018-05-27 ENCOUNTER — Ambulatory Visit (HOSPITAL_COMMUNITY): Payer: Medicaid Other | Admitting: Occupational Therapy

## 2018-05-27 DIAGNOSIS — G8929 Other chronic pain: Secondary | ICD-10-CM

## 2018-05-27 DIAGNOSIS — M25512 Pain in left shoulder: Principal | ICD-10-CM

## 2018-05-27 DIAGNOSIS — M25511 Pain in right shoulder: Secondary | ICD-10-CM

## 2018-05-27 DIAGNOSIS — R29898 Other symptoms and signs involving the musculoskeletal system: Secondary | ICD-10-CM

## 2018-05-27 NOTE — Therapy (Addendum)
[place], [place], [address] Phone: [phone]   Fax:  [phone]  Occupational Therapy Treatment   Patient Details  Name: [name] [name] MRN: [id number] Date of Birth: Mar 12, 1961 Referring Provider: Dr. Arther Abbott   Encounter Date: 05/27/2018  OT End of Session - 05/27/18 1200    Visit Number  8    Number of Visits  12    Date for OT Re-Evaluation  06/03/18    Authorization Type  Medicaid    Authorization Time Period  Medicaid approved 12 visits (04/23/18-06/03/18)    Authorization - Visit Number  5    Authorization - Number of Visits  12    OT Start Time  1443 pt arrived at 1119 but was at front desk until 1125    OT Stop Time  1205    OT Time Calculation (min)  40 min    Activity Tolerance  Patient tolerated treatment well    Behavior During Therapy  [facility] for tasks assessed/performed       Past Medical History:  Diagnosis Date  . Arthritis   . Brain aneurysm   . Headache   . Hypertension    QUIT TAKING BP MEDS NONE IN 10+ YEARS  . 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 Cristopher Peru)  . Thyroid disease    PROCEDURE FOR THYROID 15 YRS AGO AT [facility]  . WPW (Wolff-Parkinson-White syndrome)     Past Surgical History:  Procedure Laterality Date  . BREAST SURGERY     BX RT BREAST  BENIGN  . CRANIOTOMY N/A 01/04/2016   Procedure: Suboccipital Craniotomy and Cervical one Laminectomy for Clipping of Aneurysm;  Surgeon: Kevan Ny Ditty, MD;  Location: [place] [facility];  Service: Neurosurgery;  Laterality: N/A;  . 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 Christella Noa;  Surgeon: Kevan Ny Ditty, MD;  Location: [place];  Service: Neurosurgery;  Laterality: Right;  . ELECTROPHYSIOLOGIC STUDY    . LAPAROSCOPIC REVISION VENTRICULAR-PERITONEAL (V-P) SHUNT N/A 02/04/2016   Procedure: LAPAROSCOPIC Insertion VENTRICULAR-PERITONEAL (V-P) SHUNT;  Surgeon: Rolm Bookbinder, MD;  Location: [place] [facility] OR[facility];  Service: General;  Laterality: N/A;  . RADIOLOGY WITH ANESTHESIA N/A 01/03/2016   Procedure: RADIOLOGY WITH ANESTHESIA;  Surgeon: Medication Radiologist, MD;  Location: [place];  Service: Radiology;  Laterality: N/A;  . VENTRICULOPERITONEAL SHUNT Right 02/04/2016   Procedure: SHUNT INSERTION VENTRICULAR-PERITONEAL With Laparoscopic Assistance;  Surgeon: Kevan Ny Ditty, MD;  Location: [place] [facility];  Service: Neurosurgery;  Laterality: Right;    There were no vitals filed for this visit.  Subjective Assessment - 05/27/18 1120    Subjective   S: It's feeling alright this morning.     Currently in Pain?  No/denies         [facility] OT Assessment - 05/27/18 1119      Assessment   Medical Diagnosis  neck, bilateral shoulder pain      Precautions   Precautions  None               OT Treatments/Exercises (OP) - 05/27/18 1125      Exercises  Exercises  Shoulder      Shoulder Exercises: Supine   Protraction  PROM;5 reps;Strengthening;10 reps    Protraction Weight (lbs)  1    Horizontal ABduction  PROM;5 reps;Strengthening;10 reps    Horizontal ABduction Weight (lbs)  1    External Rotation  PROM;5 reps;Strengthening;10 reps    External Rotation Weight (lbs)  1    Internal Rotation  PROM;5 reps;Strengthening;10 reps    Internal Rotation Weight (lbs)  1    Flexion  PROM;5 reps;Strengthening;10 reps    Shoulder Flexion Weight (lbs)  1    ABduction  PROM;5 reps;AROM;10 reps      Shoulder Exercises: Standing   Protraction  Strengthening;10 reps;Theraband;12 reps     Theraband Level (Shoulder Protraction)  Level 2 (Red)    Protraction Weight (lbs)  1    Horizontal ABduction  Strengthening;10 reps;Theraband;12 reps    Theraband Level (Shoulder Horizontal ABduction)  Level 2 (Red)    Horizontal ABduction Weight (lbs)  1    External Rotation  Strengthening;10 reps;Theraband;12 reps    Theraband Level (Shoulder External Rotation)  Level 2 (Red)    External Rotation Weight (lbs)  1    Internal Rotation  Strengthening;10 reps    Internal Rotation Weight (lbs)  1    Flexion  Strengthening;10 reps;Theraband;12 reps    Theraband Level (Shoulder Flexion)  Level 2 (Red)    Shoulder Flexion Weight (lbs)  1    ABduction  Strengthening;10 reps;Theraband;12 reps    Theraband Level (Shoulder ABduction)  Level 2 (Red)    Shoulder ABduction Weight (lbs)  1    Extension  Theraband;10 reps    Theraband Level (Shoulder Extension)  Level 3 (Green)    Row  [employer] reps    Theraband Level (Shoulder Row)  Level 3 (Green)    Retraction  Theraband;10 reps    Theraband Level (Shoulder Retraction)  Level 3 (Green)      Shoulder Exercises: ROM/Strengthening   UBE (Upper Arm Bike)  2' reverse, level 1 pace: 4.0    Over Head Lace  1' left    X to V Arms  10X, 1#    Proximal Shoulder Strengthening, Seated  10X each 1# weight, no rest breaks    Ball on Wall  1' flexion 1' abduction both arms green ball    Other ROM/Strengthening Exercises  Y arms facing wall; 12X               OT Short Term Goals - 04/14/18 1506      OT SHORT TERM GOAL #1   Title  Pt will be provided with and educated on HEP to improve BUE and cervical neck functional use during ADLs.     Time  4    Period  Weeks    Status  On-going      OT SHORT TERM GOAL #2   Title  Pt will decrease pain in BUE and cervial neck to 3/10 or less to improve ability to sleep.     Time  4    Period  Weeks    Status  On-going      OT SHORT TERM GOAL #3   Title  Pt will decrease fascial restrictions in BUE  and cervical neck to minimal amounts to improve mobility required for functional reaching tasks.     Time  4    Period  Weeks    Status  On-going      OT SHORT TERM GOAL #4  Title  Pt will improve BUE ROM to WNL to increase ability to perform dressing tasks without compensatory strategies.     Time  4    Period  Weeks    Status  On-going      OT SHORT TERM GOAL #5   Title  Pt will increase cervical neck ROM to [facility] to improve ability to turn head when driving.     Time  4    Period  Weeks    Status  On-going      OT SHORT TERM GOAL #6   Title  Pt will improve BUE strength to 4+/5 to improve ability to lift pots and pans during meal preparation tasks.     Time  4    Period  Weeks    Status  On-going               Plan - 05/27/18 1135    Clinical Impression Statement  A: Continued with P/ROM to left shoulder only today. Progressed to strengthening using 1# weight in standing, did not complete sidelying due to pt attire. Continued with therapy ball exercises and shoulder strengthening added red theraband strengthening, max verbal cuing for form and technique.     Plan  P:continue with red theraband strengthening       Patient will benefit from skilled therapeutic intervention in order to improve the following deficits and impairments:  Decreased activity tolerance, Decreased strength, Impaired flexibility, Decreased range of motion, Pain, Impaired UE functional use, Increased fascial restrictions  Visit Diagnosis: Chronic left shoulder pain  Chronic right shoulder pain  Other symptoms and signs involving the musculoskeletal system    Problem List Patient Active Problem List   Diagnosis Date Noted  . Left hemiparesis ([place]) 11/17/2016  . Idiopathic hypotension   . Cognitive deficit, post-stroke   . CVA (cerebral vascular accident) ([place]) 10/29/2016  . Stroke due to embolism of middle cerebral artery ([place]) 10/29/2016  . Seizure prophylaxis   . History of  supraventricular tachycardia   . Neurologic abnormality   . Stroke syndrome   . Vascular headache   . Other secondary hypertension   . Hypokalemia   . AKI (acute kidney injury) ([place])   . Lymphocytosis   . Lethargy   . Brain edema ([place])   . Intracranial aneurysm 07/09/2016  . Communicating hydrocephalus 02/03/2016  . Essential hypertension   . Paroxysmal SVT (supraventricular tachycardia) ([place])   . Paroxysmal atrial fibrillation (HCC)   . Tobacco abuse   . Tachypnea   . Bradycardia   . Leukocytosis   . Acute blood loss anemia   . Thrombocytosis ([place])   . Chest pain   . Nontraumatic subarachnoid hemorrhage ([place])   . Acute respiratory failure ([place])   . Encounter for central line placement   . Hypoxemia   . Renal insufficiency   . Encounter for imaging study to confirm nasogastric (NG) tube placement   . Endotracheally intubated   . Respiratory failure ([place])   . SAH (subarachnoid hemorrhage) ([place])   . Subarachnoid hemorrhage due to ruptured aneurysm ([place]) 01/03/2016  . Subarachnoid hemorrhage ([place]) 01/03/2016  . Atrial fibrillation with rapid ventricular response ([place])   . Elevated lactic acid level   . SMOKER 03/20/2009  . HYPERTENSION, UNSPECIFIED 03/20/2009  . WPW 03/20/2009  . SVT/ PSVT/ PAT 03/20/2009  . BRONCHITIS 03/20/2009   Guadelupe Sabin, OTR/L  [phone] 05/27/2018, 12:01 PM  [facility] [address] [place], [place], [address]  Phone: [phone]   Fax:  [phone]  Name: ZELPHIA GLOVER MRN: [id number] Date of Birth: 1961/10/15   Addendum: 09/22/2018  OCCUPATIONAL THERAPY DISCHARGE SUMMARY  Visits from Start of Care: 8  Current functional level related to goals / functional outcomes: Unknown. Pt has not returned to therapy since last visit on 05/27/2018    Remaining deficits: unknown   Education / Equipment: HEP for strengthening, ROM Plan: Patient agrees to discharge.  Patient goals were not met.  Patient is being discharged due to not returning since the last visit.  ?????

## 2018-05-31 ENCOUNTER — Other Ambulatory Visit (HOSPITAL_COMMUNITY): Payer: Self-pay | Admitting: Neurosurgery

## 2018-05-31 DIAGNOSIS — I671 Cerebral aneurysm, nonruptured: Secondary | ICD-10-CM

## 2018-06-01 ENCOUNTER — Ambulatory Visit (HOSPITAL_COMMUNITY): Payer: Medicaid Other | Admitting: Occupational Therapy

## 2018-06-03 ENCOUNTER — Ambulatory Visit (HOSPITAL_COMMUNITY): Payer: Medicaid Other | Admitting: Occupational Therapy

## 2018-06-09 ENCOUNTER — Other Ambulatory Visit: Payer: Self-pay | Admitting: Orthopedic Surgery

## 2018-06-09 DIAGNOSIS — M542 Cervicalgia: Secondary | ICD-10-CM

## 2018-06-16 ENCOUNTER — Ambulatory Visit (HOSPITAL_COMMUNITY)
Admission: RE | Admit: 2018-06-16 | Discharge: 2018-06-16 | Disposition: A | Discharge status: Home / Self Care | Payer: Medicaid Other | Source: Ambulatory Visit | Attending: Neurosurgery | Admitting: Neurosurgery

## 2018-06-16 DIAGNOSIS — I671 Cerebral aneurysm, nonruptured: Secondary | ICD-10-CM

## 2018-06-27 ENCOUNTER — Other Ambulatory Visit: Payer: Self-pay | Admitting: Orthopedic Surgery

## 2018-06-27 DIAGNOSIS — M542 Cervicalgia: Secondary | ICD-10-CM

## 2018-07-09 ENCOUNTER — Encounter (HOSPITAL_COMMUNITY): Payer: Self-pay

## 2018-07-09 ENCOUNTER — Ambulatory Visit (HOSPITAL_COMMUNITY)
Admission: RE | Admit: 2018-07-09 | Discharge: 2018-07-09 | Disposition: A | Discharge status: Home / Self Care | Payer: Medicaid Other | Source: Ambulatory Visit | Attending: Neurosurgery | Admitting: Neurosurgery

## 2018-07-09 ENCOUNTER — Other Ambulatory Visit (HOSPITAL_COMMUNITY): Payer: Self-pay | Admitting: Neurosurgery

## 2018-07-09 DIAGNOSIS — Z9889 Other specified postprocedural states: Secondary | ICD-10-CM | POA: Diagnosis not present

## 2018-07-09 DIAGNOSIS — I671 Cerebral aneurysm, nonruptured: Secondary | ICD-10-CM

## 2018-07-09 MED ORDER — IOPAMIDOL (ISOVUE-370) INJECTION 76%
75.0000 mL | Freq: Once | INTRAVENOUS | Status: AC | PRN
  Administered 2018-07-09: 75 mL via INTRAVENOUS

## 2018-09-22 ENCOUNTER — Other Ambulatory Visit: Payer: Self-pay | Admitting: Orthopedic Surgery

## 2018-09-22 DIAGNOSIS — M542 Cervicalgia: Secondary | ICD-10-CM

## 2018-10-22 ENCOUNTER — Other Ambulatory Visit: Payer: Self-pay | Admitting: Orthopedic Surgery

## 2018-10-22 DIAGNOSIS — M542 Cervicalgia: Secondary | ICD-10-CM

## 2018-11-03 ENCOUNTER — Other Ambulatory Visit: Payer: Self-pay | Admitting: Orthopedic Surgery

## 2018-11-03 DIAGNOSIS — M542 Cervicalgia: Secondary | ICD-10-CM

## 2018-12-24 ENCOUNTER — Other Ambulatory Visit: Payer: Self-pay | Admitting: Orthopedic Surgery

## 2018-12-24 DIAGNOSIS — M542 Cervicalgia: Secondary | ICD-10-CM

## 2019-01-11 DIAGNOSIS — I10 Essential (primary) hypertension: Secondary | ICD-10-CM | POA: Diagnosis not present

## 2019-01-11 DIAGNOSIS — E039 Hypothyroidism, unspecified: Secondary | ICD-10-CM | POA: Diagnosis not present

## 2019-01-20 ENCOUNTER — Other Ambulatory Visit: Payer: Self-pay | Admitting: Orthopedic Surgery

## 2019-01-20 DIAGNOSIS — M542 Cervicalgia: Secondary | ICD-10-CM

## 2019-02-21 DIAGNOSIS — I10 Essential (primary) hypertension: Secondary | ICD-10-CM | POA: Diagnosis not present

## 2019-02-21 DIAGNOSIS — E782 Mixed hyperlipidemia: Secondary | ICD-10-CM | POA: Diagnosis not present

## 2019-02-21 DIAGNOSIS — M130 Polyarthritis, unspecified: Secondary | ICD-10-CM | POA: Diagnosis not present

## 2019-02-21 DIAGNOSIS — Z0000 Encounter for general adult medical examination without abnormal findings: Secondary | ICD-10-CM | POA: Diagnosis not present

## 2019-02-23 DIAGNOSIS — Z1231 Encounter for screening mammogram for malignant neoplasm of breast: Secondary | ICD-10-CM | POA: Diagnosis not present

## 2019-03-25 ENCOUNTER — Other Ambulatory Visit: Payer: Self-pay | Admitting: Orthopedic Surgery

## 2019-03-25 DIAGNOSIS — M542 Cervicalgia: Secondary | ICD-10-CM

## 2019-04-20 ENCOUNTER — Other Ambulatory Visit: Payer: Self-pay | Admitting: Orthopedic Surgery

## 2019-04-20 DIAGNOSIS — M542 Cervicalgia: Secondary | ICD-10-CM

## 2019-04-27 ENCOUNTER — Encounter: Payer: Self-pay | Admitting: Gastroenterology

## 2019-05-17 DIAGNOSIS — E039 Hypothyroidism, unspecified: Secondary | ICD-10-CM | POA: Diagnosis not present

## 2019-05-17 DIAGNOSIS — I10 Essential (primary) hypertension: Secondary | ICD-10-CM | POA: Diagnosis not present

## 2019-05-17 DIAGNOSIS — E782 Mixed hyperlipidemia: Secondary | ICD-10-CM | POA: Diagnosis not present

## 2019-05-17 DIAGNOSIS — I679 Cerebrovascular disease, unspecified: Secondary | ICD-10-CM | POA: Diagnosis not present

## 2019-06-25 ENCOUNTER — Other Ambulatory Visit: Payer: Self-pay | Admitting: Orthopedic Surgery

## 2019-06-25 DIAGNOSIS — M542 Cervicalgia: Secondary | ICD-10-CM

## 2019-07-04 ENCOUNTER — Other Ambulatory Visit: Payer: Self-pay

## 2019-07-04 ENCOUNTER — Ambulatory Visit (INDEPENDENT_AMBULATORY_CARE_PROVIDER_SITE_OTHER): Payer: Medicare Other | Admitting: Gastroenterology

## 2019-07-04 ENCOUNTER — Encounter: Payer: Self-pay | Admitting: Gastroenterology

## 2019-07-04 DIAGNOSIS — Z1211 Encounter for screening for malignant neoplasm of colon: Secondary | ICD-10-CM

## 2019-07-04 DIAGNOSIS — Z79899 Other long term (current) drug therapy: Secondary | ICD-10-CM | POA: Diagnosis not present

## 2019-07-04 NOTE — Assessment & Plan Note (Signed)
Patient voiced interest in weight loss measures.  [facility]  Briefly discussed healthy eating habits, handout provided.  Encouraged her to follow-up with PCP and consider nutrition consult for weight loss purposes.

## 2019-07-04 NOTE — Assessment & Plan Note (Signed)
Very pleasant 57 year old female presenting to schedule first ever colonoscopy.  Denies any GI symptoms.  No family history of colon cancer.  Plan for deep sedation given history of polypharmacy.  I have discussed the risks, alternatives, benefits with regards to but not limited to the risk of reaction to medication, bleeding, infection, perforation and the patient is agreeable to proceed. Written consent to be obtained.

## 2019-07-04 NOTE — Progress Notes (Signed)
CC'D TO PCP °

## 2019-07-04 NOTE — Patient Instructions (Signed)
1. Colonoscopy as scheduled. See separate instructions.  2.  Discuss possibility of seeing a nutritionist for weight loss purposes with Dr. Criss Rosales   Healthy Eating Following a healthy eating pattern may help you to achieve and maintain a healthy body weight, reduce the risk of chronic disease, and live a long and productive life. It is important to follow a healthy eating pattern at an appropriate calorie level for your body. Your nutritional needs should be met primarily through food by choosing a variety of nutrient-rich foods. What are tips for following this plan? Reading food labels  Read labels and choose the following: ? Reduced or low sodium. ? Juices with 100% fruit juice. ? Foods with low saturated fats and high polyunsaturated and monounsaturated fats. ? Foods with whole grains, such as whole wheat, cracked wheat, brown rice, and wild rice. ? Whole grains that are fortified with folic acid. This is recommended for women who are pregnant or who want to become pregnant.  Read labels and avoid the following: ? Foods with a lot of added sugars. These include foods that contain brown sugar, corn sweetener, corn syrup, dextrose, fructose, glucose, high-fructose corn syrup, honey, invert sugar, lactose, malt syrup, maltose, molasses, raw sugar, sucrose, trehalose, or turbinado sugar.  Do not eat more than the following amounts of added sugar per day:  6 teaspoons (25 g) for women.  9 teaspoons (38 g) for men. ? Foods that contain processed or refined starches and grains. ? Refined grain products, such as white flour, degermed cornmeal, white bread, and white rice. Shopping  Choose nutrient-rich snacks, such as vegetables, whole fruits, and nuts. Avoid high-calorie and high-sugar snacks, such as potato chips, fruit snacks, and candy.  Use oil-based dressings and spreads on foods instead of solid fats such as butter, stick margarine, or cream cheese.  Limit pre-made sauces, mixes,  and "instant" products such as flavored rice, instant noodles, and ready-made pasta.  Try more plant-protein sources, such as tofu, tempeh, black beans, edamame, lentils, nuts, and seeds.  Explore eating plans such as the Mediterranean diet or vegetarian diet. Cooking  Use oil to saut or stir-fry foods instead of solid fats such as butter, stick margarine, or lard.  Try baking, boiling, grilling, or broiling instead of frying.  Remove the fatty part of meats before cooking.  Steam vegetables in water or broth. Meal planning   At meals, imagine dividing your plate into fourths: ? One-half of your plate is fruits and vegetables. ? One-fourth of your plate is whole grains. ? One-fourth of your plate is protein, especially lean meats, poultry, eggs, tofu, beans, or nuts.  Include low-fat dairy as part of your daily diet. Lifestyle  Choose healthy options in all settings, including home, work, school, restaurants, or stores.  Prepare your food safely: ? Wash your hands after handling raw meats. ? Keep food preparation surfaces clean by regularly washing with hot, soapy water. ? Keep raw meats separate from ready-to-eat foods, such as fruits and vegetables. ? Cook seafood, meat, poultry, and eggs to the recommended internal temperature. ? Store foods at safe temperatures. In general:  Keep cold foods at 72F (4.4C) or below.  Keep hot foods at 172F (60C) or above.  Keep your freezer at [facility] (-17.8C) or below.  Foods are no longer safe to eat when they have been between the temperatures of 40-172F (4.4-60C) for more than 2 hours. What foods should I eat? Fruits Aim to eat 2 cup-equivalents of fresh, canned (in natural  juice), or frozen fruits each day. Examples of 1 cup-equivalent of fruit include 1 small apple, 8 large strawberries, 1 cup canned fruit,  cup dried fruit, or 1 cup 100% juice. Vegetables Aim to eat 2-3 cup-equivalents of fresh and frozen vegetables each  day, including different varieties and colors. Examples of 1 cup-equivalent of vegetables include 2 medium carrots, 2 cups raw, leafy greens, 1 cup chopped vegetable (raw or cooked), or 1 medium baked potato. Grains Aim to eat 6 ounce-equivalents of whole grains each day. Examples of 1 ounce-equivalent of grains include 1 slice of bread, 1 cup ready-to-eat cereal, 3 cups popcorn, or  cup cooked rice, pasta, or cereal. Meats and other proteins Aim to eat 5-6 ounce-equivalents of protein each day. Examples of 1 ounce-equivalent of protein include 1 egg, 1/2 cup nuts or seeds, or 1 tablespoon (16 g) peanut butter. A cut of meat or fish that is the size of a deck of cards is about 3-4 ounce-equivalents.  Of the protein you eat each week, try to have at least 8 ounces come from seafood. This includes salmon, trout, herring, and anchovies. Dairy Aim to eat 3 cup-equivalents of fat-free or low-fat dairy each day. Examples of 1 cup-equivalent of dairy include 1 cup (240 mL) milk, 8 ounces (250 g) yogurt, 1 ounces (44 g) natural cheese, or 1 cup (240 mL) fortified soy milk. Fats and oils  Aim for about 5 teaspoons (21 g) per day. Choose monounsaturated fats, such as canola and olive oils, avocados, peanut butter, and most nuts, or polyunsaturated fats, such as sunflower, corn, and soybean oils, walnuts, pine nuts, sesame seeds, sunflower seeds, and flaxseed. Beverages  Aim for six 8-oz glasses of water per day. Limit coffee to three to five 8-oz cups per day.  Limit caffeinated beverages that have added calories, such as soda and energy drinks.  Limit alcohol intake to no more than 1 drink a day for nonpregnant women and 2 drinks a day for men. One drink equals 12 oz of beer (355 mL), 5 oz of wine (148 mL), or 1 oz of hard liquor (44 mL). Seasoning and other foods  Avoid adding excess amounts of salt to your foods. Try flavoring foods with herbs and spices instead of salt.  Avoid adding sugar to  foods.  Try using oil-based dressings, sauces, and spreads instead of solid fats. This information is based on general [place] nutrition guidelines. For more information, visit [employer]. Exact amounts may vary based on your nutrition needs. Summary  A healthy eating plan may help you to maintain a healthy weight, reduce the risk of chronic diseases, and stay active throughout your life.  Plan your meals. Make sure you eat the right portions of a variety of nutrient-rich foods.  Try baking, boiling, grilling, or broiling instead of frying.  Choose healthy options in all settings, including home, work, school, restaurants, or stores. This information is not intended to replace advice given to you by your health care provider. Make sure you discuss any questions you have with your health care provider. Document Released: 03/22/2018 Document Revised: 03/22/2018 Document Reviewed: 03/22/2018 Elsevier Patient Education  2020 [employer].

## 2019-07-04 NOTE — Progress Notes (Signed)
Primary Care Physician:  Lucianne Lei, MD  Primary Gastroenterologist:  Barney Drain, MD   Chief Complaint  Patient presents with  . Colonoscopy    consult, never had tcs    HPI:  [name] [name] is a 58 y.o. female here at the request of Dr. Criss Rosales to schedule first ever colonoscopy. Due to polypharmacy, she was brought in today to evaluate for mode of sedation.  Patient denies any family history of colon cancer.  No GI complaints.  Bowel movements are regular.  No blood in the stool or melena.  No abdominal pain.  No upper GI symptoms.  She would like to lose some weight.  She states she was more active prior to [place] because she was [facility] the [facility].  Current Outpatient Medications  Medication Sig Dispense Refill  . acetaminophen (TYLENOL) 500 MG tablet Take 1,000 mg by mouth daily as needed for moderate pain or headache.    Marland Kitchen amLODipine (NORVASC) 5 MG tablet Take 5 mg by mouth daily.    . cyclobenzaprine (FLEXERIL) 10 MG tablet TAKE 1 TABLET BY MOUTH TWICE DAILY AS NEEDED FOR MUSCLE SPASMS 40 tablet 0  . cyclobenzaprine (FLEXERIL) 10 MG tablet TAKE 1 TABLET BY MOUTH TWICE DAILY AS NEEDED FOR MUSCLE SPASMS 40 tablet 0  . cyclobenzaprine (FLEXERIL) 10 MG tablet TAKE 1 TABLET BY MOUTH TWICE DAILY AS NEEDED FOR MUSCLE SPASMS 40 tablet 0  . diazepam (VALIUM) 5 MG tablet Take 1 tablet (5 mg total) by mouth 2 (two) times daily. 10 tablet 0  . gabapentin (NEURONTIN) 100 MG capsule TAKE 1 CAPSULE BY MOUTH THREE TIMES DAILY 90 capsule 2  . HYDROcodone-acetaminophen (NORCO/VICODIN) 5-325 MG tablet Take 1 tablet by mouth every 4 (four) hours as needed. 10 tablet 0  . naproxen (NAPROSYN) 500 MG tablet TAKE 1 TABLET BY MOUTH TWICE DAILY WITH A MEAL 60 tablet 2  . rosuvastatin (CRESTOR) 20 MG tablet Take 20 mg by mouth daily.    . VOLTAREN 1 % GEL APPLY 2 INCHES TO EACH JOINT QID  5   No current facility-administered medications for this visit.     Allergies as of 07/04/2019 -  Review Complete 07/04/2019  Allergen Reaction Noted  . No known allergies  07/08/2016    Past Medical History:  Diagnosis Date  . Arthritis   . Brain aneurysm   . Headache   . Hypertension       . 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 Cristopher Peru)  . Thyroid disease    PROCEDURE FOR THYROID 15 YRS AGO AT [facility]  . WPW (Wolff-Parkinson-White syndrome)     Past Surgical History:  Procedure Laterality Date  . BREAST SURGERY     BX RT BREAST  BENIGN  . CRANIOTOMY N/A 01/04/2016   Procedure: Suboccipital Craniotomy and Cervical one Laminectomy for Clipping of Aneurysm;  Surgeon: Kevan Ny Ditty, MD;  Location: [place] [facility];  Service: Neurosurgery;  Laterality: N/A;  . CRANIOTOMY N/A 07/09/2016   Procedure: Craniotomy for clipping of left middle cerebral artery aneurysm;  Surgeon: Kevan Ny Ditty, MD;  Location: [place] [facility];  Service: Neurosurgery;  Laterality: N/A;  Craniotomy for clipping of left middle cerebral artery aneurysm  . 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 Christella Noa;  Surgeon: Kevan Ny Ditty, MD;  Location: [facility];  Service: Neurosurgery;  Laterality: Right;  . ELECTROPHYSIOLOGIC  STUDY    . LAPAROSCOPIC REVISION VENTRICULAR-PERITONEAL (V-P) SHUNT N/A 02/04/2016   Procedure: LAPAROSCOPIC Insertion VENTRICULAR-PERITONEAL (V-P) SHUNT;  Surgeon: Rolm Bookbinder, MD;  Location: [place] [facility];  Service: General;  Laterality: N/A;  . RADIOLOGY WITH ANESTHESIA N/A 01/03/2016   Procedure: RADIOLOGY WITH ANESTHESIA;  Surgeon: Medication Radiologist, MD;  Location: [facility];  Service: Radiology;  Laterality: N/A;  . VENTRICULOPERITONEAL SHUNT Right 02/04/2016   Procedure: SHUNT INSERTION VENTRICULAR-PERITONEAL With Laparoscopic Assistance;  Surgeon: Kevan Ny Ditty, MD;  Location: [place] [facility];  Service: Neurosurgery;   Laterality: Right;    Family History  Problem Relation Age of Onset  . Hypertension Mother   . Prostate cancer Father   . Cancer Sister   . Colon cancer Neg Hx   . Colon polyps Neg Hx     Social History   Socioeconomic History  . Marital status: Single    Spouse name: Not on file  . Number of children: Not on file  . Years of education: Not on file  . Highest education level: Not on file  Occupational History  . Not on file  Social Needs  . Financial resource strain: Not on file  . Food insecurity    Worry: Not on file    Inability: Not on file  . Transportation needs    Medical: Not on file    Non-medical: Not on file  Tobacco Use  . Smoking status: Current Every Day Smoker    Packs/day: 0.50  . Smokeless tobacco: Never Used  Substance and Sexual Activity  . Alcohol use: No  . Drug use: No  . Sexual activity: Not on file  Lifestyle  . Physical activity    Days per week: Not on file    Minutes per session: Not on file  . Stress: Not on file  Relationships  . Social Herbalist on phone: Not on file    Gets together: Not on file    Attends religious service: Not on file    Active member of club or organization: Not on file    Attends meetings of clubs or organizations: Not on file    Relationship status: Not on file  . Intimate partner violence    Fear of current or ex partner: Not on file    Emotionally abused: Not on file    Physically abused: Not on file    Forced sexual activity: Not on file  Other Topics Concern  . Not on file  Social History Narrative  . Not on file      ROS:  General: Negative for anorexia, weight loss, fever, chills, fatigue, weakness. Eyes: Negative for vision changes.  ENT: Negative for hoarseness, difficulty swallowing , nasal congestion. CV: Negative for chest pain, angina, palpitations, dyspnea on exertion, peripheral edema.  Respiratory: Negative for dyspnea at rest, dyspnea on exertion, cough, sputum,  wheezing.  GI: See history of present illness. GU:  Negative for dysuria, hematuria, urinary incontinence, urinary frequency, nocturnal urination.  MS: Negative for joint pain, low back pain.  Derm: Negative for rash or itching.  Neuro: Negative for weakness, abnormal sensation, seizure, frequent headaches, memory loss, confusion.  Psych: Negative for anxiety, depression, suicidal ideation, hallucinations.  Endo: Negative for unusual weight change.  Heme: Negative for bruising or bleeding. Allergy: Negative for rash or hives.    Physical Examination:  BP 120/76   Pulse 68   Temp (!) 97.4 F (36.3 C) (Oral)   Ht 5\' 3"  (1.6 m)  Wt 232 lb 3.2 oz (105.3 kg)   BMI 41.13 kg/m    General: Well-nourished, well-developed in no acute distress.  Head: Normocephalic, atraumatic.   Eyes: Conjunctiva pink, no icterus. Mouth: Oropharyngeal mucosa moist and pink , no lesions erythema or exudate. Neck: Supple without thyromegaly, masses, or lymphadenopathy.  Lungs: Clear to auscultation bilaterally.  Heart: Regular rate and rhythm, no murmurs rubs or gallops.  Abdomen: Bowel sounds are normal, nontender, nondistended, no hepatosplenomegaly or masses, no abdominal bruits or    hernia , no rebound or guarding.   Rectal: Not performed Extremities: No lower extremity edema. No clubbing or deformities.  Neuro: Alert and oriented x 4 , grossly normal neurologically.  Skin: Warm and dry, no rash or jaundice.   Psych: Alert and cooperative, normal mood and affect.  Labs: 09/11/2019 white blood cell count 6400, hemoglobin 14.1, hematocrit 44.6, MCV 84.6, platelets 269,000, glucose 66, creatinine 1.22, BUN 15, total bilirubin 0.3, alkaline phosphatase 88, AST 14, ALT 18, albumin 4.3, TSH 0.8.  Imaging Studies: No results found.

## 2019-07-12 ENCOUNTER — Telehealth: Payer: Self-pay | Admitting: *Deleted

## 2019-07-12 ENCOUNTER — Other Ambulatory Visit: Payer: Self-pay | Admitting: *Deleted

## 2019-07-12 DIAGNOSIS — Z1211 Encounter for screening for malignant neoplasm of colon: Secondary | ICD-10-CM

## 2019-07-12 MED ORDER — PEG 3350-KCL-NA BICARB-NACL 420 G PO SOLR: 4000.0000 mL | Freq: Once | ORAL | 0 refills | Status: AC

## 2019-07-12 NOTE — Telephone Encounter (Signed)
Called pt, she is schedule for TCS with propofol with [facility] on 10/6 at 1:15pm. Patient aware will mail instructions with pre-op appt (confirmed mailing address). Rx sent to the pharmacy and aware to pick up. Orders placed.

## 2019-07-26 ENCOUNTER — Other Ambulatory Visit: Payer: Self-pay | Admitting: Neurosurgery

## 2019-07-26 ENCOUNTER — Other Ambulatory Visit (HOSPITAL_COMMUNITY): Payer: Self-pay | Admitting: Neurosurgery

## 2019-07-27 ENCOUNTER — Other Ambulatory Visit (HOSPITAL_COMMUNITY): Payer: Self-pay | Admitting: Neurosurgery

## 2019-07-27 ENCOUNTER — Other Ambulatory Visit: Payer: Self-pay | Admitting: Neurosurgery

## 2019-07-27 DIAGNOSIS — I671 Cerebral aneurysm, nonruptured: Secondary | ICD-10-CM

## 2019-08-15 ENCOUNTER — Other Ambulatory Visit: Payer: Self-pay

## 2019-08-15 ENCOUNTER — Other Ambulatory Visit (HOSPITAL_COMMUNITY): Payer: Self-pay | Admitting: Neurosurgery

## 2019-08-15 ENCOUNTER — Ambulatory Visit (HOSPITAL_COMMUNITY)
Admission: RE | Admit: 2019-08-15 | Discharge: 2019-08-15 | Disposition: A | Discharge status: Home / Self Care | Payer: Medicare Other | Source: Ambulatory Visit | Attending: Neurosurgery | Admitting: Neurosurgery

## 2019-08-15 DIAGNOSIS — I671 Cerebral aneurysm, nonruptured: Secondary | ICD-10-CM | POA: Diagnosis not present

## 2019-08-15 LAB — POCT I-STAT CREATININE: Creatinine, Ser: 1.3 mg/dL — ABNORMAL HIGH (ref 0.44–1.00)

## 2019-08-15 MED ORDER — IOHEXOL 300 MG/ML  SOLN
75.0000 mL | Freq: Once | INTRAMUSCULAR | Status: AC | PRN
  Administered 2019-08-15: 75 mL via INTRAVENOUS

## 2019-08-16 DIAGNOSIS — I609 Nontraumatic subarachnoid hemorrhage, unspecified: Secondary | ICD-10-CM | POA: Diagnosis not present

## 2019-08-16 DIAGNOSIS — I671 Cerebral aneurysm, nonruptured: Secondary | ICD-10-CM | POA: Diagnosis not present

## 2019-08-16 DIAGNOSIS — I10 Essential (primary) hypertension: Secondary | ICD-10-CM | POA: Diagnosis not present

## 2019-08-17 DIAGNOSIS — I10 Essential (primary) hypertension: Secondary | ICD-10-CM | POA: Diagnosis not present

## 2019-08-17 DIAGNOSIS — I679 Cerebrovascular disease, unspecified: Secondary | ICD-10-CM | POA: Diagnosis not present

## 2019-08-17 DIAGNOSIS — M130 Polyarthritis, unspecified: Secondary | ICD-10-CM | POA: Diagnosis not present

## 2019-08-17 DIAGNOSIS — E039 Hypothyroidism, unspecified: Secondary | ICD-10-CM | POA: Diagnosis not present

## 2019-08-17 DIAGNOSIS — E782 Mixed hyperlipidemia: Secondary | ICD-10-CM | POA: Diagnosis not present

## 2019-09-20 NOTE — Patient Instructions (Signed)
[name] [name]  09/20/2019     @PREFPERIOPPHARMACY @   Your procedure is scheduled on  09/27/2019 .  Report to [facility] at  1145   A.M.  Call this number if you have problems the morning of surgery:  [phone]   Remember:  Follow the diet and prep instructions given to you by Dr Nona Dell office.                    Take these medicines the morning of surgery with A SIP OF WATER  Amlodipine, flexaril(if needed), valium (if needed), gabapentin, hydrocodone(if needed).    Do not wear jewelry, make-up or nail polish.  Do not wear lotions, powders, or perfumes. Please wear deodorant and brush your teeth.  Do not shave 48 hours prior to surgery.  Men may shave face and neck.  Do not bring valuables to the hospital.  [facility] is not responsible for any belongings or valuables.  Contacts, dentures or bridgework may not be worn into surgery.  Leave your suitcase in the car.  After surgery it may be brought to your room.  For patients admitted to the hospital, discharge time will be determined by your treatment team.  Patients discharged the day of surgery will not be allowed to drive home.   Name and phone number of your driver:   family Special instructions:  None  Please read over the following fact sheets that you were given. Anesthesia Post-op Instructions and Care and Recovery After Surgery       Colonoscopy, Adult, Care After This sheet gives you information about how to care for yourself after your procedure. Your health care provider may also give you more specific instructions. If you have problems or questions, contact your health care provider. What can I expect after the procedure? After the procedure, it is common to have:  A small amount of blood in your stool for 24 hours after the procedure.  Some gas.  Mild abdominal cramping or bloating. Follow these instructions at home: General instructions  For the first 24 hours after the procedure: ? Do  not drive or use machinery. ? Do not sign important documents. ? Do not drink alcohol. ? Do your regular daily activities at a slower pace than normal. ? Eat soft, easy-to-digest foods.  Take over-the-counter or prescription medicines only as told by your health care provider. Relieving cramping and bloating   Try walking around when you have cramps or feel bloated.  Apply heat to your abdomen as told by your health care provider. Use a heat source that your health care provider recommends, such as a moist heat pack or a heating pad. ? Place a towel between your skin and the heat source. ? Leave the heat on for 20-30 minutes. ? Remove the heat if your skin turns bright red. This is especially important if you are unable to feel pain, heat, or cold. You may have a greater risk of getting burned. Eating and drinking   Drink enough fluid to keep your urine pale yellow.  Resume your normal diet as instructed by your health care provider. Avoid heavy or fried foods that are hard to digest.  Avoid drinking alcohol for as long as instructed by your health care provider. Contact a health care provider if:  You have blood in your stool 2-3 days after the procedure. Get help right away if:  You have more than a small spotting of blood  in your stool.  You pass large blood clots in your stool.  Your abdomen is swollen.  You have nausea or vomiting.  You have a fever.  You have increasing abdominal pain that is not relieved with medicine. Summary  After the procedure, it is common to have a small amount of blood in your stool. You may also have mild abdominal cramping and bloating.  For the first 24 hours after the procedure, do not drive or use machinery, sign important documents, or drink alcohol.  Contact your health care provider if you have a lot of blood in your stool, nausea or vomiting, a fever, or increased abdominal pain. This information is not intended to replace advice  given to you by your health care provider. Make sure you discuss any questions you have with your health care provider. Document Released: 07/22/2004 Document Revised: 09/30/2017 Document Reviewed: 02/19/2016 Elsevier Patient Education  2020 [place] After These instructions provide you with information about caring for yourself after your procedure. Your health care provider may also give you more specific instructions. Your treatment has been planned according to current medical practices, but problems sometimes occur. Call your health care provider if you have any problems or questions after your procedure. What can I expect after the procedure? After your procedure, you may:  Feel sleepy for several hours.  Feel clumsy and have poor balance for several hours.  Feel forgetful about what happened after the procedure.  Have poor judgment for several hours.  Feel nauseous or vomit.  Have a sore throat if you had a breathing tube during the procedure. Follow these instructions at home: For at least 24 hours after the procedure:      Have a responsible adult stay with you. It is important to have someone help care for you until you are awake and alert.  Rest as needed.  Do not: ? Participate in activities in which you could fall or become injured. ? Drive. ? Use heavy machinery. ? Drink alcohol. ? Take sleeping pills or medicines that cause drowsiness. ? Make important decisions or sign legal documents. ? Take care of children on your own. Eating and drinking  Follow the diet that is recommended by your health care provider.  If you vomit, drink water, juice, or soup when you can drink without vomiting.  Make sure you have little or no nausea before eating solid foods. General instructions  Take over-the-counter and prescription medicines only as told by your health care provider.  If you have sleep apnea, surgery and certain  medicines can increase your risk for breathing problems. Follow instructions from your health care provider about wearing your sleep device: ? Anytime you are sleeping, including during daytime naps. ? While taking prescription pain medicines, sleeping medicines, or medicines that make you drowsy.  If you smoke, do not smoke without supervision.  Keep all follow-up visits as told by your health care provider. This is important. Contact a health care provider if:  You keep feeling nauseous or you keep vomiting.  You feel light-headed.  You develop a rash.  You have a fever. Get help right away if:  You have trouble breathing. Summary  For several hours after your procedure, you may feel sleepy and have poor judgment.  Have a responsible adult stay with you for at least 24 hours or until you are awake and alert. This information is not intended to replace advice given to you by your health care  provider. Make sure you discuss any questions you have with your health care provider. Document Released: 03/30/2016 Document Revised: 03/08/2018 Document Reviewed: 03/30/2016 Elsevier Patient Education  2020 [employer].

## 2019-09-23 ENCOUNTER — Other Ambulatory Visit (HOSPITAL_COMMUNITY)
Admission: RE | Admit: 2019-09-23 | Discharge: 2019-09-23 | Disposition: A | Discharge status: Home / Self Care | Payer: Medicare Other | Source: Ambulatory Visit | Attending: Gastroenterology | Admitting: Gastroenterology

## 2019-09-23 ENCOUNTER — Other Ambulatory Visit: Payer: Self-pay

## 2019-09-23 ENCOUNTER — Encounter (HOSPITAL_COMMUNITY)
Admission: RE | Admit: 2019-09-23 | Discharge: 2019-09-23 | Disposition: A | Discharge status: Home / Self Care | Payer: Medicare Other | Source: Ambulatory Visit | Attending: Gastroenterology | Admitting: Gastroenterology

## 2019-09-23 ENCOUNTER — Encounter (HOSPITAL_COMMUNITY): Payer: Self-pay

## 2019-09-26 ENCOUNTER — Telehealth: Payer: Self-pay | Admitting: *Deleted

## 2019-09-26 NOTE — Telephone Encounter (Signed)
Received call from Endo that patient did not show up for covid or pre-op appt on Friday.   Called patient and she stated she forgot about it. She also has already ate this morning and did not start clear liquids. Patient will need to r/s. She is aware will call once we receive January schedule to r/s. Endo aware.

## 2019-09-27 ENCOUNTER — Ambulatory Visit (HOSPITAL_COMMUNITY): Admission: RE | Admit: 2019-09-27 | Payer: Medicare Other | Source: Home / Self Care | Admitting: Gastroenterology

## 2019-09-27 ENCOUNTER — Telehealth: Payer: Self-pay | Admitting: *Deleted

## 2019-09-27 ENCOUNTER — Encounter (HOSPITAL_COMMUNITY): Admission: RE | Payer: Self-pay | Source: Home / Self Care

## 2019-09-27 SURGERY — COLONOSCOPY WITH PROPOFOL
Anesthesia: Monitor Anesthesia Care

## 2019-09-27 NOTE — Telephone Encounter (Signed)
LMOVM

## 2019-10-19 NOTE — Telephone Encounter (Signed)
Letter mailed to call back

## 2019-12-02 ENCOUNTER — Other Ambulatory Visit: Payer: Self-pay

## 2019-12-02 NOTE — Patient Outreach (Signed)
[place] [facility]) Care Management  12/02/2019  KAROLINA OCEAN Dec 20, 1961 [id number]   Medication Adherence call to Mrs. Shahd Venzor HIPPA Compliant Voice message left with a call back number. Mrs. Waln is showing past due on Valsartan/Hctz 160/25 mg under [facility].  [facility] Direct Dial [phone]  Fax [phone] [email or web address]

## 2019-12-20 DIAGNOSIS — I10 Essential (primary) hypertension: Secondary | ICD-10-CM | POA: Diagnosis not present

## 2019-12-20 DIAGNOSIS — E039 Hypothyroidism, unspecified: Secondary | ICD-10-CM | POA: Diagnosis not present

## 2019-12-20 DIAGNOSIS — E782 Mixed hyperlipidemia: Secondary | ICD-10-CM | POA: Diagnosis not present

## 2019-12-20 DIAGNOSIS — E1169 Type 2 diabetes mellitus with other specified complication: Secondary | ICD-10-CM | POA: Diagnosis not present

## 2019-12-20 DIAGNOSIS — I671 Cerebral aneurysm, nonruptured: Secondary | ICD-10-CM | POA: Diagnosis not present

## 2019-12-20 DIAGNOSIS — M130 Polyarthritis, unspecified: Secondary | ICD-10-CM | POA: Diagnosis not present

## 2020-01-04 DIAGNOSIS — E039 Hypothyroidism, unspecified: Secondary | ICD-10-CM | POA: Diagnosis not present

## 2020-01-04 DIAGNOSIS — I674 Hypertensive encephalopathy: Secondary | ICD-10-CM | POA: Diagnosis not present

## 2020-01-04 DIAGNOSIS — I679 Cerebrovascular disease, unspecified: Secondary | ICD-10-CM | POA: Diagnosis not present

## 2020-01-04 DIAGNOSIS — E782 Mixed hyperlipidemia: Secondary | ICD-10-CM | POA: Diagnosis not present

## 2020-01-04 DIAGNOSIS — I10 Essential (primary) hypertension: Secondary | ICD-10-CM | POA: Diagnosis not present

## 2020-01-22 DIAGNOSIS — E039 Hypothyroidism, unspecified: Secondary | ICD-10-CM | POA: Diagnosis not present

## 2020-01-22 DIAGNOSIS — E785 Hyperlipidemia, unspecified: Secondary | ICD-10-CM | POA: Diagnosis not present

## 2020-01-22 DIAGNOSIS — I10 Essential (primary) hypertension: Secondary | ICD-10-CM | POA: Diagnosis not present

## 2020-02-19 DIAGNOSIS — I10 Essential (primary) hypertension: Secondary | ICD-10-CM | POA: Diagnosis not present

## 2020-02-19 DIAGNOSIS — E785 Hyperlipidemia, unspecified: Secondary | ICD-10-CM | POA: Diagnosis not present

## 2020-02-19 DIAGNOSIS — E039 Hypothyroidism, unspecified: Secondary | ICD-10-CM | POA: Diagnosis not present

## 2020-03-07 DIAGNOSIS — E782 Mixed hyperlipidemia: Secondary | ICD-10-CM | POA: Diagnosis not present

## 2020-03-07 DIAGNOSIS — E039 Hypothyroidism, unspecified: Secondary | ICD-10-CM | POA: Diagnosis not present

## 2020-03-07 DIAGNOSIS — E1169 Type 2 diabetes mellitus with other specified complication: Secondary | ICD-10-CM | POA: Diagnosis not present

## 2020-03-07 DIAGNOSIS — M130 Polyarthritis, unspecified: Secondary | ICD-10-CM | POA: Diagnosis not present

## 2020-03-07 DIAGNOSIS — I671 Cerebral aneurysm, nonruptured: Secondary | ICD-10-CM | POA: Diagnosis not present

## 2020-03-07 DIAGNOSIS — I10 Essential (primary) hypertension: Secondary | ICD-10-CM | POA: Diagnosis not present

## 2020-03-20 DIAGNOSIS — I10 Essential (primary) hypertension: Secondary | ICD-10-CM | POA: Diagnosis not present

## 2020-03-20 DIAGNOSIS — E039 Hypothyroidism, unspecified: Secondary | ICD-10-CM | POA: Diagnosis not present

## 2020-03-20 DIAGNOSIS — E785 Hyperlipidemia, unspecified: Secondary | ICD-10-CM | POA: Diagnosis not present

## 2020-04-20 DIAGNOSIS — E039 Hypothyroidism, unspecified: Secondary | ICD-10-CM | POA: Diagnosis not present

## 2020-04-20 DIAGNOSIS — I10 Essential (primary) hypertension: Secondary | ICD-10-CM | POA: Diagnosis not present

## 2020-04-20 DIAGNOSIS — E785 Hyperlipidemia, unspecified: Secondary | ICD-10-CM | POA: Diagnosis not present

## 2020-05-16 DIAGNOSIS — E1169 Type 2 diabetes mellitus with other specified complication: Secondary | ICD-10-CM | POA: Diagnosis not present

## 2020-05-16 DIAGNOSIS — E039 Hypothyroidism, unspecified: Secondary | ICD-10-CM | POA: Diagnosis not present

## 2020-05-16 DIAGNOSIS — I671 Cerebral aneurysm, nonruptured: Secondary | ICD-10-CM | POA: Diagnosis not present

## 2020-05-16 DIAGNOSIS — I10 Essential (primary) hypertension: Secondary | ICD-10-CM | POA: Diagnosis not present

## 2020-05-16 DIAGNOSIS — E6609 Other obesity due to excess calories: Secondary | ICD-10-CM | POA: Diagnosis not present

## 2020-05-21 DIAGNOSIS — I10 Essential (primary) hypertension: Secondary | ICD-10-CM | POA: Diagnosis not present

## 2020-05-21 DIAGNOSIS — E039 Hypothyroidism, unspecified: Secondary | ICD-10-CM | POA: Diagnosis not present

## 2020-05-21 DIAGNOSIS — E785 Hyperlipidemia, unspecified: Secondary | ICD-10-CM | POA: Diagnosis not present

## 2020-05-24 ENCOUNTER — Other Ambulatory Visit (HOSPITAL_COMMUNITY)
Admission: RE | Admit: 2020-05-24 | Discharge: 2020-05-24 | Disposition: A | Discharge status: Home / Self Care | Payer: Medicare HMO | Source: Ambulatory Visit | Attending: Family Medicine | Admitting: Family Medicine

## 2020-05-24 ENCOUNTER — Other Ambulatory Visit: Payer: Self-pay

## 2020-05-24 DIAGNOSIS — N289 Disorder of kidney and ureter, unspecified: Secondary | ICD-10-CM | POA: Diagnosis not present

## 2020-05-24 LAB — RENAL FUNCTION PANEL
Albumin: 3.8 g/dL (ref 3.5–5.0)
Anion gap: 12 (ref 5–15)
BUN: 26 mg/dL — ABNORMAL HIGH (ref 6–20)
CO2: 26 mmol/L (ref 22–32)
Calcium: 9.2 mg/dL (ref 8.9–10.3)
Chloride: 102 mmol/L (ref 98–111)
Creatinine, Ser: 1.85 mg/dL — ABNORMAL HIGH (ref 0.44–1.00)
GFR calc Af Amer: 34 mL/min — ABNORMAL LOW (ref >60)
GFR calc non Af Amer: 29 mL/min — ABNORMAL LOW (ref >60)
Glucose, Bld: 92 mg/dL (ref 70–99)
Phosphorus: 2.8 mg/dL (ref 2.5–4.6)
Potassium: 3.5 mmol/L (ref 3.5–5.1)
Sodium: 140 mmol/L (ref 135–145)

## 2020-06-20 DIAGNOSIS — I10 Essential (primary) hypertension: Secondary | ICD-10-CM | POA: Diagnosis not present

## 2020-06-20 DIAGNOSIS — E785 Hyperlipidemia, unspecified: Secondary | ICD-10-CM | POA: Diagnosis not present

## 2020-06-20 DIAGNOSIS — E039 Hypothyroidism, unspecified: Secondary | ICD-10-CM | POA: Diagnosis not present

## 2020-07-13 ENCOUNTER — Other Ambulatory Visit (HOSPITAL_COMMUNITY): Payer: Self-pay | Admitting: Neurosurgery

## 2020-07-13 ENCOUNTER — Other Ambulatory Visit: Payer: Self-pay | Admitting: Neurosurgery

## 2020-07-13 DIAGNOSIS — I671 Cerebral aneurysm, nonruptured: Secondary | ICD-10-CM

## 2020-08-06 ENCOUNTER — Other Ambulatory Visit: Payer: Self-pay

## 2020-08-06 ENCOUNTER — Encounter (HOSPITAL_COMMUNITY): Payer: Self-pay

## 2020-08-06 ENCOUNTER — Ambulatory Visit (HOSPITAL_COMMUNITY)
Admission: RE | Admit: 2020-08-06 | Discharge: 2020-08-06 | Disposition: A | Discharge status: Home / Self Care | Payer: Medicare Other | Source: Ambulatory Visit | Attending: Neurosurgery | Admitting: Neurosurgery

## 2020-08-06 DIAGNOSIS — I671 Cerebral aneurysm, nonruptured: Secondary | ICD-10-CM | POA: Diagnosis not present

## 2020-08-06 LAB — POCT I-STAT CREATININE: Creatinine, Ser: 2.2 mg/dL — ABNORMAL HIGH (ref 0.44–1.00)

## 2020-08-06 MED ORDER — IOHEXOL 350 MG/ML SOLN
75.0000 mL | Freq: Once | INTRAVENOUS | Status: AC | PRN
  Administered 2020-08-06: 75 mL via INTRAVENOUS

## 2020-08-10 DIAGNOSIS — I10 Essential (primary) hypertension: Secondary | ICD-10-CM | POA: Diagnosis not present

## 2020-08-10 DIAGNOSIS — E039 Hypothyroidism, unspecified: Secondary | ICD-10-CM | POA: Diagnosis not present

## 2020-08-10 DIAGNOSIS — N189 Chronic kidney disease, unspecified: Secondary | ICD-10-CM | POA: Diagnosis not present

## 2020-08-10 DIAGNOSIS — R7309 Other abnormal glucose: Secondary | ICD-10-CM | POA: Diagnosis not present

## 2020-08-10 DIAGNOSIS — E785 Hyperlipidemia, unspecified: Secondary | ICD-10-CM | POA: Diagnosis not present

## 2020-08-21 DIAGNOSIS — E785 Hyperlipidemia, unspecified: Secondary | ICD-10-CM | POA: Diagnosis not present

## 2020-08-21 DIAGNOSIS — I10 Essential (primary) hypertension: Secondary | ICD-10-CM | POA: Diagnosis not present

## 2020-08-21 DIAGNOSIS — E039 Hypothyroidism, unspecified: Secondary | ICD-10-CM | POA: Diagnosis not present

## 2020-11-14 DIAGNOSIS — M545 Low back pain, unspecified: Secondary | ICD-10-CM | POA: Diagnosis not present

## 2020-11-14 DIAGNOSIS — I671 Cerebral aneurysm, nonruptured: Secondary | ICD-10-CM | POA: Diagnosis not present

## 2020-11-14 DIAGNOSIS — E034 Atrophy of thyroid (acquired): Secondary | ICD-10-CM | POA: Diagnosis not present

## 2020-11-14 DIAGNOSIS — E1140 Type 2 diabetes mellitus with diabetic neuropathy, unspecified: Secondary | ICD-10-CM | POA: Diagnosis not present

## 2020-11-20 DIAGNOSIS — E039 Hypothyroidism, unspecified: Secondary | ICD-10-CM | POA: Diagnosis not present

## 2020-11-20 DIAGNOSIS — I10 Essential (primary) hypertension: Secondary | ICD-10-CM | POA: Diagnosis not present

## 2020-11-20 DIAGNOSIS — E785 Hyperlipidemia, unspecified: Secondary | ICD-10-CM | POA: Diagnosis not present

## 2021-01-21 DIAGNOSIS — I10 Essential (primary) hypertension: Secondary | ICD-10-CM | POA: Diagnosis not present

## 2021-01-21 DIAGNOSIS — E785 Hyperlipidemia, unspecified: Secondary | ICD-10-CM | POA: Diagnosis not present

## 2021-01-21 DIAGNOSIS — E039 Hypothyroidism, unspecified: Secondary | ICD-10-CM | POA: Diagnosis not present

## 2021-06-04 ENCOUNTER — Encounter (HOSPITAL_COMMUNITY): Payer: Self-pay

## 2021-06-04 ENCOUNTER — Inpatient Hospital Stay (HOSPITAL_COMMUNITY)
Admission: EM | Admit: 2021-06-04 | Discharge: 2021-06-21 | DRG: 003 | Disposition: E | Payer: 59 | Attending: Thoracic Surgery (Cardiothoracic Vascular Surgery) | Admitting: Thoracic Surgery (Cardiothoracic Vascular Surgery)

## 2021-06-04 ENCOUNTER — Other Ambulatory Visit (HOSPITAL_COMMUNITY): Payer: Self-pay | Admitting: *Deleted

## 2021-06-04 ENCOUNTER — Other Ambulatory Visit: Payer: Self-pay

## 2021-06-04 ENCOUNTER — Emergency Department (HOSPITAL_COMMUNITY): Payer: 59

## 2021-06-04 ENCOUNTER — Observation Stay (HOSPITAL_BASED_OUTPATIENT_CLINIC_OR_DEPARTMENT_OTHER): Payer: 59

## 2021-06-04 DIAGNOSIS — Z515 Encounter for palliative care: Secondary | ICD-10-CM

## 2021-06-04 DIAGNOSIS — R0789 Other chest pain: Secondary | ICD-10-CM | POA: Diagnosis not present

## 2021-06-04 DIAGNOSIS — I509 Heart failure, unspecified: Secondary | ICD-10-CM

## 2021-06-04 DIAGNOSIS — I2510 Atherosclerotic heart disease of native coronary artery without angina pectoris: Secondary | ICD-10-CM | POA: Diagnosis present

## 2021-06-04 DIAGNOSIS — E1165 Type 2 diabetes mellitus with hyperglycemia: Secondary | ICD-10-CM | POA: Diagnosis present

## 2021-06-04 DIAGNOSIS — R34 Anuria and oliguria: Secondary | ICD-10-CM | POA: Diagnosis not present

## 2021-06-04 DIAGNOSIS — Z978 Presence of other specified devices: Secondary | ICD-10-CM

## 2021-06-04 DIAGNOSIS — I2542 Coronary artery dissection: Principal | ICD-10-CM | POA: Diagnosis present

## 2021-06-04 DIAGNOSIS — Z79890 Hormone replacement therapy (postmenopausal): Secondary | ICD-10-CM

## 2021-06-04 DIAGNOSIS — I361 Nonrheumatic tricuspid (valve) insufficiency: Secondary | ICD-10-CM | POA: Diagnosis not present

## 2021-06-04 DIAGNOSIS — N1832 Chronic kidney disease, stage 3b: Secondary | ICD-10-CM | POA: Diagnosis present

## 2021-06-04 DIAGNOSIS — D62 Acute posthemorrhagic anemia: Secondary | ICD-10-CM | POA: Diagnosis not present

## 2021-06-04 DIAGNOSIS — J9601 Acute respiratory failure with hypoxia: Secondary | ICD-10-CM

## 2021-06-04 DIAGNOSIS — E1122 Type 2 diabetes mellitus with diabetic chronic kidney disease: Secondary | ICD-10-CM | POA: Diagnosis present

## 2021-06-04 DIAGNOSIS — G839 Paralytic syndrome, unspecified: Secondary | ICD-10-CM | POA: Diagnosis not present

## 2021-06-04 DIAGNOSIS — D680 Von Willebrand's disease: Secondary | ICD-10-CM | POA: Diagnosis present

## 2021-06-04 DIAGNOSIS — R001 Bradycardia, unspecified: Secondary | ICD-10-CM | POA: Diagnosis not present

## 2021-06-04 DIAGNOSIS — K7200 Acute and subacute hepatic failure without coma: Secondary | ICD-10-CM | POA: Diagnosis not present

## 2021-06-04 DIAGNOSIS — Z66 Do not resuscitate: Secondary | ICD-10-CM | POA: Diagnosis not present

## 2021-06-04 DIAGNOSIS — K9189 Other postprocedural complications and disorders of digestive system: Secondary | ICD-10-CM | POA: Diagnosis present

## 2021-06-04 DIAGNOSIS — R079 Chest pain, unspecified: Secondary | ICD-10-CM

## 2021-06-04 DIAGNOSIS — N183 Chronic kidney disease, stage 3 unspecified: Secondary | ICD-10-CM

## 2021-06-04 DIAGNOSIS — R570 Cardiogenic shock: Secondary | ICD-10-CM | POA: Diagnosis present

## 2021-06-04 DIAGNOSIS — J189 Pneumonia, unspecified organism: Secondary | ICD-10-CM

## 2021-06-04 DIAGNOSIS — J80 Acute respiratory distress syndrome: Secondary | ICD-10-CM | POA: Diagnosis present

## 2021-06-04 DIAGNOSIS — I340 Nonrheumatic mitral (valve) insufficiency: Secondary | ICD-10-CM

## 2021-06-04 DIAGNOSIS — Z452 Encounter for adjustment and management of vascular access device: Secondary | ICD-10-CM

## 2021-06-04 DIAGNOSIS — J9602 Acute respiratory failure with hypercapnia: Secondary | ICD-10-CM

## 2021-06-04 DIAGNOSIS — J9819 Other pulmonary collapse: Secondary | ICD-10-CM

## 2021-06-04 DIAGNOSIS — I272 Pulmonary hypertension, unspecified: Secondary | ICD-10-CM | POA: Diagnosis present

## 2021-06-04 DIAGNOSIS — Z8673 Personal history of transient ischemic attack (TIA), and cerebral infarction without residual deficits: Secondary | ICD-10-CM

## 2021-06-04 DIAGNOSIS — E876 Hypokalemia: Secondary | ICD-10-CM | POA: Diagnosis not present

## 2021-06-04 DIAGNOSIS — J9811 Atelectasis: Secondary | ICD-10-CM

## 2021-06-04 DIAGNOSIS — F17210 Nicotine dependence, cigarettes, uncomplicated: Secondary | ICD-10-CM | POA: Diagnosis present

## 2021-06-04 DIAGNOSIS — G9349 Other encephalopathy: Secondary | ICD-10-CM | POA: Diagnosis present

## 2021-06-04 DIAGNOSIS — I480 Paroxysmal atrial fibrillation: Secondary | ICD-10-CM | POA: Diagnosis not present

## 2021-06-04 DIAGNOSIS — E785 Hyperlipidemia, unspecified: Secondary | ICD-10-CM

## 2021-06-04 DIAGNOSIS — Z951 Presence of aortocoronary bypass graft: Secondary | ICD-10-CM

## 2021-06-04 DIAGNOSIS — Z20822 Contact with and (suspected) exposure to covid-19: Secondary | ICD-10-CM | POA: Diagnosis present

## 2021-06-04 DIAGNOSIS — Z79899 Other long term (current) drug therapy: Secondary | ICD-10-CM

## 2021-06-04 DIAGNOSIS — R778 Other specified abnormalities of plasma proteins: Secondary | ICD-10-CM

## 2021-06-04 DIAGNOSIS — Z8249 Family history of ischemic heart disease and other diseases of the circulatory system: Secondary | ICD-10-CM

## 2021-06-04 DIAGNOSIS — N170 Acute kidney failure with tubular necrosis: Secondary | ICD-10-CM | POA: Diagnosis not present

## 2021-06-04 DIAGNOSIS — D6959 Other secondary thrombocytopenia: Secondary | ICD-10-CM | POA: Diagnosis present

## 2021-06-04 DIAGNOSIS — I314 Cardiac tamponade: Secondary | ICD-10-CM | POA: Diagnosis not present

## 2021-06-04 DIAGNOSIS — I213 ST elevation (STEMI) myocardial infarction of unspecified site: Secondary | ICD-10-CM | POA: Diagnosis not present

## 2021-06-04 DIAGNOSIS — E669 Obesity, unspecified: Secondary | ICD-10-CM | POA: Diagnosis present

## 2021-06-04 DIAGNOSIS — I214 Non-ST elevation (NSTEMI) myocardial infarction: Secondary | ICD-10-CM | POA: Diagnosis present

## 2021-06-04 DIAGNOSIS — I25110 Atherosclerotic heart disease of native coronary artery with unstable angina pectoris: Secondary | ICD-10-CM | POA: Diagnosis present

## 2021-06-04 DIAGNOSIS — E872 Acidosis: Secondary | ICD-10-CM | POA: Diagnosis present

## 2021-06-04 DIAGNOSIS — E039 Hypothyroidism, unspecified: Secondary | ICD-10-CM | POA: Diagnosis present

## 2021-06-04 DIAGNOSIS — Z9281 Personal history of extracorporeal membrane oxygenation (ECMO): Secondary | ICD-10-CM

## 2021-06-04 DIAGNOSIS — R140 Abdominal distension (gaseous): Secondary | ICD-10-CM

## 2021-06-04 DIAGNOSIS — K567 Ileus, unspecified: Secondary | ICD-10-CM | POA: Diagnosis present

## 2021-06-04 DIAGNOSIS — I462 Cardiac arrest due to underlying cardiac condition: Secondary | ICD-10-CM | POA: Diagnosis not present

## 2021-06-04 DIAGNOSIS — I130 Hypertensive heart and chronic kidney disease with heart failure and stage 1 through stage 4 chronic kidney disease, or unspecified chronic kidney disease: Secondary | ICD-10-CM | POA: Diagnosis present

## 2021-06-04 DIAGNOSIS — R092 Respiratory arrest: Secondary | ICD-10-CM

## 2021-06-04 DIAGNOSIS — I4901 Ventricular fibrillation: Secondary | ICD-10-CM | POA: Diagnosis not present

## 2021-06-04 DIAGNOSIS — Z982 Presence of cerebrospinal fluid drainage device: Secondary | ICD-10-CM

## 2021-06-04 DIAGNOSIS — I456 Pre-excitation syndrome: Secondary | ICD-10-CM | POA: Diagnosis present

## 2021-06-04 DIAGNOSIS — A419 Sepsis, unspecified organism: Secondary | ICD-10-CM | POA: Diagnosis not present

## 2021-06-04 DIAGNOSIS — I10 Essential (primary) hypertension: Secondary | ICD-10-CM | POA: Diagnosis not present

## 2021-06-04 DIAGNOSIS — Z6834 Body mass index (BMI) 34.0-34.9, adult: Secondary | ICD-10-CM

## 2021-06-04 DIAGNOSIS — K5900 Constipation, unspecified: Secondary | ICD-10-CM

## 2021-06-04 DIAGNOSIS — J942 Hemothorax: Secondary | ICD-10-CM | POA: Diagnosis not present

## 2021-06-04 DIAGNOSIS — Z4659 Encounter for fitting and adjustment of other gastrointestinal appliance and device: Secondary | ICD-10-CM

## 2021-06-04 DIAGNOSIS — Z7984 Long term (current) use of oral hypoglycemic drugs: Secondary | ICD-10-CM

## 2021-06-04 HISTORY — DX: Type 2 diabetes mellitus without complications: E11.9

## 2021-06-04 LAB — HIV ANTIBODY (ROUTINE TESTING W REFLEX): HIV Screen 4th Generation wRfx: NONREACTIVE

## 2021-06-04 LAB — BASIC METABOLIC PANEL
Anion gap: 7 (ref 5–15)
BUN: 16 mg/dL (ref 6–20)
CO2: 25 mmol/L (ref 22–32)
Calcium: 9 mg/dL (ref 8.9–10.3)
Chloride: 108 mmol/L (ref 98–111)
Creatinine, Ser: 1.67 mg/dL — ABNORMAL HIGH (ref 0.44–1.00)
GFR, Estimated: 35 mL/min — ABNORMAL LOW (ref >60)
Glucose, Bld: 94 mg/dL (ref 70–99)
Potassium: 3.9 mmol/L (ref 3.5–5.1)
Sodium: 140 mmol/L (ref 135–145)

## 2021-06-04 LAB — CBC
HCT: 42.2 % (ref 36.0–46.0)
Hemoglobin: 13.2 g/dL (ref 12.0–15.0)
MCH: 28.6 pg (ref 26.0–34.0)
MCHC: 31.3 g/dL (ref 30.0–36.0)
MCV: 91.3 fL (ref 80.0–100.0)
Platelets: 230 10*3/uL (ref 150–400)
RBC: 4.62 MIL/uL (ref 3.87–5.11)
RDW: 16.3 % — ABNORMAL HIGH (ref 11.5–15.5)
WBC: 7 10*3/uL (ref 4.0–10.5)
nRBC: 0 % (ref 0.0–0.2)

## 2021-06-04 LAB — ECHOCARDIOGRAM COMPLETE
Area-P 1/2: 3.53 cm2
Height: 63 in
S' Lateral: 2.5 cm
Weight: 3072 oz

## 2021-06-04 LAB — GLUCOSE, CAPILLARY
Glucose-Capillary: 102 mg/dL — ABNORMAL HIGH (ref 70–99)
Glucose-Capillary: 122 mg/dL — ABNORMAL HIGH (ref 70–99)

## 2021-06-04 LAB — RESP PANEL BY RT-PCR (FLU A&B, COVID) ARPGX2
Influenza A by PCR: NEGATIVE
Influenza B by PCR: NEGATIVE
SARS Coronavirus 2 by RT PCR: NEGATIVE

## 2021-06-04 LAB — TROPONIN I (HIGH SENSITIVITY)
Troponin I (High Sensitivity): 44 ng/L — ABNORMAL HIGH (ref <18)
Troponin I (High Sensitivity): 206 ng/L (ref <18)
Troponin I (High Sensitivity): 372 ng/L (ref <18)
Troponin I (High Sensitivity): 1070 ng/L (ref <18)
Troponin I (High Sensitivity): 1455 ng/L (ref <18)

## 2021-06-04 MED ORDER — ASPIRIN EC 81 MG PO TBEC: 243.0000 mg | DELAYED_RELEASE_TABLET | Freq: Every day | ORAL | Status: DC

## 2021-06-04 MED ORDER — ONDANSETRON HCL 4 MG/2ML IJ SOLN: 4.0000 mg | Freq: Four times a day (QID) | INTRAMUSCULAR | Status: DC | PRN

## 2021-06-04 MED ORDER — ACETAMINOPHEN 650 MG RE SUPP: 650.0000 mg | Freq: Four times a day (QID) | RECTAL | Status: DC | PRN

## 2021-06-04 MED ORDER — SODIUM CHLORIDE 0.9 % IV SOLN: 250.0000 mL | INTRAVENOUS | Status: DC | PRN

## 2021-06-04 MED ORDER — LEVOTHYROXINE SODIUM 88 MCG PO TABS
88.0000 ug | ORAL_TABLET | Freq: Every day | ORAL | Status: DC
  Administered 2021-06-04 – 2021-06-06 (×3): 88 ug via ORAL
  Filled 2021-06-04 (×3): qty 1

## 2021-06-04 MED ORDER — AMLODIPINE BESYLATE 5 MG PO TABS
5.0000 mg | ORAL_TABLET | Freq: Every evening | ORAL | Status: DC
  Filled 2021-06-04: qty 1

## 2021-06-04 MED ORDER — SODIUM CHLORIDE 0.9% FLUSH
3.0000 mL | Freq: Two times a day (BID) | INTRAVENOUS | Status: DC
  Administered 2021-06-04 – 2021-06-05 (×3): 3 mL via INTRAVENOUS

## 2021-06-04 MED ORDER — HEPARIN (PORCINE) 25000 UT/250ML-% IV SOLN
650.0000 [IU]/h | INTRAVENOUS | Status: DC
  Administered 2021-06-04: 900 [IU]/h via INTRAVENOUS
  Filled 2021-06-04: qty 250

## 2021-06-04 MED ORDER — ACETAMINOPHEN 500 MG PO TABS
1000.0000 mg | ORAL_TABLET | Freq: Once | ORAL | Status: AC
  Administered 2021-06-04: 1000 mg via ORAL
  Filled 2021-06-04: qty 2

## 2021-06-04 MED ORDER — ROSUVASTATIN CALCIUM 20 MG PO TABS
40.0000 mg | ORAL_TABLET | Freq: Every day | ORAL | Status: DC
  Administered 2021-06-04: 40 mg via ORAL
  Filled 2021-06-04: qty 2

## 2021-06-04 MED ORDER — ONDANSETRON HCL 4 MG PO TABS: 4.0000 mg | ORAL_TABLET | Freq: Four times a day (QID) | ORAL | Status: DC | PRN

## 2021-06-04 MED ORDER — INSULIN ASPART 100 UNIT/ML IJ SOLN: 0.0000 [IU] | Freq: Three times a day (TID) | INTRAMUSCULAR | Status: DC

## 2021-06-04 MED ORDER — NITROGLYCERIN 0.4 MG SL SUBL
0.4000 mg | SUBLINGUAL_TABLET | SUBLINGUAL | Status: DC | PRN
  Administered 2021-06-04: 0.4 mg via SUBLINGUAL
  Filled 2021-06-04: qty 1

## 2021-06-04 MED ORDER — REGADENOSON 0.4 MG/5ML IV SOLN
0.4000 mg | Freq: Once | INTRAVENOUS | Status: DC
  Filled 2021-06-04: qty 5

## 2021-06-04 MED ORDER — FAMOTIDINE 20 MG PO TABS
20.0000 mg | ORAL_TABLET | Freq: Once | ORAL | Status: AC
  Administered 2021-06-04: 20 mg via ORAL
  Filled 2021-06-04: qty 1

## 2021-06-04 MED ORDER — SODIUM CHLORIDE 0.9% FLUSH: 3.0000 mL | INTRAVENOUS | Status: DC | PRN

## 2021-06-04 MED ORDER — HYDRALAZINE HCL 20 MG/ML IJ SOLN: 10.0000 mg | INTRAMUSCULAR | Status: DC | PRN

## 2021-06-04 MED ORDER — ASPIRIN 81 MG PO CHEW
324.0000 mg | CHEWABLE_TABLET | Freq: Once | ORAL | Status: AC
  Administered 2021-06-04: 324 mg via ORAL
  Filled 2021-06-04: qty 4

## 2021-06-04 MED ORDER — INSULIN ASPART 100 UNIT/ML IJ SOLN: 0.0000 [IU] | Freq: Every day | INTRAMUSCULAR | Status: DC

## 2021-06-04 MED ORDER — HEPARIN BOLUS VIA INFUSION
4000.0000 [IU] | Freq: Once | INTRAVENOUS | Status: AC
  Administered 2021-06-04: 4000 [IU] via INTRAVENOUS
  Filled 2021-06-04: qty 4000

## 2021-06-04 MED ORDER — HEPARIN SODIUM (PORCINE) 5000 UNIT/ML IJ SOLN
5000.0000 [IU] | Freq: Three times a day (TID) | INTRAMUSCULAR | Status: DC
  Administered 2021-06-04: 5000 [IU] via SUBCUTANEOUS
  Filled 2021-06-04: qty 1

## 2021-06-04 MED ORDER — ACETAMINOPHEN 325 MG PO TABS
650.0000 mg | ORAL_TABLET | Freq: Four times a day (QID) | ORAL | Status: DC | PRN
  Administered 2021-06-04: 650 mg via ORAL
  Filled 2021-06-04 (×2): qty 2

## 2021-06-04 MED ORDER — FENTANYL CITRATE (PF) 100 MCG/2ML IJ SOLN
50.0000 ug | Freq: Once | INTRAMUSCULAR | Status: AC
  Administered 2021-06-04: 50 ug via INTRAVENOUS
  Filled 2021-06-04: qty 2

## 2021-06-04 NOTE — H&P (Signed)
History and Physical    [name] [name] NOM:[id number] DOB: July 26, 1961 DOA: 06/09/2021  PCP: Lucianne Lei, MD   Patient coming from: Home  Chief Complaint: Chest pain  HPI: [name] [name] is a 60 y.o. female with medical history significant for SVT with prior ablation, hypertension, dyslipidemia, hypothyroidism, prior CVA, type 2 diabetes, and obesity who presented to the ED after waking up this morning with pain to her left sternal region.  She states that her pain began at approximately 0 800 and she tried taking some Tums without relief.  She went back to bed, but her symptoms continue to persist.  She denies any exacerbating factors such as positional changes.  She does states she has some tenderness to her chest wall on the left side.  She endorses some nausea, but no vomiting, diaphoresis, or significant dyspnea.  She does smoke half a pack of cigarettes daily with some occasional marijuana use, but denies any alcohol use.  She denies any recent cough, fevers, or chills.   ED Course: Vital signs stable with some bradycardia noted.  EKG with sinus rhythm and heart rate at 54 bpm.  No significant ST or T wave changes noted.  Troponin 44, creatinine 1.67.  She was noted to have worsening chest pain in the ED and case was discussed with cardiology who recommended observation.  Review of Systems: Reviewed as noted above, otherwise negative.  Past Medical History:  Diagnosis Date   Arthritis    Brain aneurysm    Headache    Hypertension        SVT (supraventricular tachycardia) (HCC)    s/p radiofrequency catheter ablation for AVNRT 02/16/09 (Dr. Cristopher Peru)   Thyroid disease    PROCEDURE FOR THYROID 15 YRS AGO AT [facility]   WPW (Wolff-Parkinson-White syndrome)     Past Surgical History:  Procedure Laterality Date   BREAST SURGERY     BX RT BREAST  BENIGN   CRANIOTOMY N/A 01/04/2016   Procedure: Suboccipital Craniotomy and Cervical one Laminectomy for Clipping of Aneurysm;  Surgeon:  Kevan Ny Ditty, MD;  Location: [place] [facility];  Service: Neurosurgery;  Laterality: N/A;   CRANIOTOMY N/A 07/09/2016   Procedure: Craniotomy for clipping of left middle cerebral artery aneurysm;  Surgeon: Kevan Ny Ditty, MD;  Location: [place] [facility];  Service: Neurosurgery;  Laterality: N/A;  Craniotomy for 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 Christella Noa;  Surgeon: Kevan Ny Ditty, MD;  Location: [place];  Service: Neurosurgery;  Laterality: Right;   ELECTROPHYSIOLOGIC STUDY     LAPAROSCOPIC REVISION VENTRICULAR-PERITONEAL (V-P) SHUNT N/A 02/04/2016   Procedure: LAPAROSCOPIC Insertion VENTRICULAR-PERITONEAL (V-P) SHUNT;  Surgeon: Rolm Bookbinder, MD;  Location: [place] [facility];  Service: General;  Laterality: N/A;   RADIOLOGY WITH ANESTHESIA N/A 01/03/2016   Procedure: RADIOLOGY WITH ANESTHESIA;  Surgeon: Medication Radiologist, MD;  Location: [place];  Service: Radiology;  Laterality: N/A;   VENTRICULOPERITONEAL SHUNT Right 02/04/2016   Procedure: SHUNT INSERTION VENTRICULAR-PERITONEAL With Laparoscopic Assistance;  Surgeon: Kevan Ny Ditty, MD;  Location: [place] [facility];  Service: Neurosurgery;  Laterality: Right;     reports that she has been smoking. She has been smoking an average of 0.50 packs per day. She has never used smokeless tobacco. She reports that she does not drink alcohol and does not use drugs.  No Known Allergies  Family History  Problem Relation Age of Onset  Hypertension Mother    Prostate cancer Father    Cancer Sister    Colon cancer Neg Hx    Colon polyps Neg Hx     Prior to Admission medications   Medication Sig Start Date End Date Taking? Authorizing Provider  acetaminophen (TYLENOL) 500 MG tablet Take 1,000 mg by mouth daily as needed for moderate pain or headache.   Yes [provider]  amLODipine (NORVASC) 5 MG  tablet Take 5 mg by mouth every evening.    Yes [provider]  JARDIANCE 10 MG TABS tablet Take 10 mg by mouth daily. 03/15/21  Yes [provider]  levothyroxine (SYNTHROID) 88 MCG tablet Take 88 mcg by mouth daily. 01/28/21  Yes [provider]  potassium chloride (KLOR-CON) 10 MEQ tablet Take 10 mEq by mouth daily. 04/24/21  Yes [provider]  rosuvastatin (CRESTOR) 40 MG tablet Take 40 mg by mouth daily. 01/28/21  Yes [provider]  valsartan-hydrochlorothiazide (DIOVAN-HCT) 160-25 MG tablet Take 1 tablet by mouth daily.   Yes [provider]  Vitamin D, Ergocalciferol, (DRISDOL) 1.25 MG (50000 UNIT) CAPS capsule Take 1 capsule by mouth once a week. 04/24/21  Yes [provider]  cyclobenzaprine (FLEXERIL) 10 MG tablet TAKE 1 TABLET BY MOUTH TWICE DAILY AS NEEDED FOR MUSCLE SPASMS Patient not taking: No sig reported 06/28/18   Carole Civil, MD  cyclobenzaprine (FLEXERIL) 10 MG tablet TAKE 1 TABLET BY MOUTH TWICE DAILY AS NEEDED FOR MUSCLE SPASMS Patient not taking: No sig reported 11/04/18   Carole Civil, MD  cyclobenzaprine (FLEXERIL) 10 MG tablet TAKE 1 TABLET BY MOUTH TWICE DAILY AS NEEDED FOR MUSCLE SPASMS Patient not taking: No sig reported 01/21/19   Carole Civil, MD  diazepam (VALIUM) 5 MG tablet Take 1 tablet (5 mg total) by mouth 2 (two) times daily. Patient not taking: No sig reported 03/14/18   Isla Pence, MD  gabapentin (NEURONTIN) 100 MG capsule TAKE 1 CAPSULE BY MOUTH THREE TIMES DAILY Patient not taking: No sig reported 04/21/19   Carole Civil, MD  HYDROcodone-acetaminophen (NORCO/VICODIN) 5-325 MG tablet Take 1 tablet by mouth every 4 (four) hours as needed. Patient not taking: No sig reported 03/14/18   Isla Pence, MD  naproxen (NAPROSYN) 500 MG tablet TAKE 1 TABLET BY MOUTH TWICE DAILY WITH A MEAL Patient not taking: No sig reported 06/29/19   Carole Civil, MD  VOLTAREN 1 % GEL  Apply 2 g topically 4 (four) times daily as needed (pain).  Patient not taking: Reported on 05/28/2021 02/27/18   [provider]    Physical Exam: Vitals:   05/29/2021 1018 06/16/2021 1100 06/14/2021 1120 05/28/2021 1140  BP: 135/79 129/77 113/65 122/69  Pulse: 66 (!) 47 (!) 48 (!) 48  Resp: 19 20 17 11   Temp:      TempSrc:      SpO2: 100% 96% 96% 98%  Weight:      Height:        Constitutional: NAD, calm, comfortable, obese Vitals:   06/15/2021 1018 06/01/2021 1100 06/08/2021 1120 05/28/2021 1140  BP: 135/79 129/77 113/65 122/69  Pulse: 66 (!) 47 (!) 48 (!) 48  Resp: 19 20 17 11   Temp:      TempSrc:      SpO2: 100% 96% 96% 98%  Weight:      Height:       Eyes: lids and conjunctivae normal Neck: normal, supple Respiratory: clear to auscultation bilaterally. Normal respiratory  effort. No accessory muscle use.  Cardiovascular: Regular rate and rhythm, no murmurs. Abdomen: no tenderness, no distention. Bowel sounds positive.  Musculoskeletal:  No edema.  Left sternal border tenderness to palpation along chest wall Skin: no rashes, lesions, ulcers.  Psychiatric: Flat affect  Labs on Admission: I have personally reviewed following labs and imaging studies  CBC: Recent Labs  Lab 06/06/2021 1003  WBC 7.0  HGB 13.2  HCT 42.2  MCV 91.3  PLT 673   Basic Metabolic Panel: Recent Labs  Lab 05/26/2021 1003  NA 140  K 3.9  CL 108  CO2 25  GLUCOSE 94  BUN 16  CREATININE 1.67*  CALCIUM 9.0   GFR: Estimated Creatinine Clearance: 37.5 mL/min (A) (by C-G formula based on SCr of 1.67 mg/dL (H)). Liver Function Tests: No results for input(s): AST, ALT, ALKPHOS, BILITOT, PROT, ALBUMIN in the last 168 hours. No results for input(s): LIPASE, AMYLASE in the last 168 hours. No results for input(s): AMMONIA in the last 168 hours. Coagulation Profile: No results for input(s): INR, PROTIME in the last 168 hours. Cardiac Enzymes: No results for input(s): CKTOTAL, CKMB, CKMBINDEX,  TROPONINI in the last 168 hours. BNP (last 3 results) No results for input(s): PROBNP in the last 8760 hours. HbA1C: No results for input(s): HGBA1C in the last 72 hours. CBG: No results for input(s): GLUCAP in the last 168 hours. Lipid Profile: No results for input(s): CHOL, HDL, LDLCALC, TRIG, CHOLHDL, LDLDIRECT in the last 72 hours. Thyroid Function Tests: No results for input(s): TSH, T4TOTAL, FREET4, T3FREE, THYROIDAB in the last 72 hours. Anemia Panel: No results for input(s): VITAMINB12, FOLATE, FERRITIN, TIBC, IRON, RETICCTPCT in the last 72 hours. Urine analysis:    Component Value Date/Time   COLORURINE YELLOW 10/31/2016 [place] 10/31/2016 0918   LABSPEC 1.025 10/31/2016 0918   PHURINE 5.5 10/31/2016 [place] 10/31/2016 0918   HGBUR NEGATIVE 10/31/2016 0918   BILIRUBINUR NEGATIVE 10/31/2016 0918   KETONESUR NEGATIVE 10/31/2016 0918   PROTEINUR NEGATIVE 10/31/2016 0918   NITRITE NEGATIVE 10/31/2016 0918   LEUKOCYTESUR NEGATIVE 10/31/2016 0918    Radiological Exams on Admission: DG Chest 1 View  Result Date: 05/31/2021 CLINICAL DATA:  Chest pain since early this morning, hypertension, smoker EXAM: CHEST  1 VIEW COMPARISON:  Portable exam 0938 hours compared to 07/21/2017 FINDINGS: VP shunt tubing traverses RIGHT hemithorax. Borderline enlargement of cardiac silhouette. Mediastinal contours and pulmonary vascularity normal. Minimal subsegmental atelectasis RIGHT upper lobe. Lungs otherwise clear. No pulmonary infiltrate, pleural effusion, or pneumothorax. Bones demineralized. IMPRESSION: Minimal subsegmental atelectasis RIGHT upper lobe. Electronically Signed   By: Lavonia Dana M.D.   On: 05/22/2021 09:52    EKG: Independently reviewed. SR 54bpm.  Assessment/Plan Active Problems:   Chest pain    Chest pain with elevated troponin -Continue to trend and check 2D echocardiogram -Appreciate cardiology with plans for pending Lexiscan  Myoview in a.m. versus cardiac catheterization based on further values  Bradycardia -Patient not currently on any AV nodal blockade agents  History of SVT with prior ablation -Currently in sinus rhythm -Prior ablation in 2010 by Dr. Lovena Le -Continue to monitor on telemetry  AKI versus CKD stage IIIb -Current creatinine 1.67 with prior values noted to be in similar range, but creatinine fluctuates -Continue to closely monitor -Hold nephrotoxic agents -Monitor strict I's and O's -Repeat labs in a.m.  History of hypothyroidism -Continue levothyroxine -Check TSH  History of type 2 diabetes -Hold Jardiance -SSI and carb  modified diet  Hypertension -Continue amlodipine -Hold valsartan/HCTZ -IV hydralazine as needed for significant elevations  Dyslipidemia -Continue statin -Check FLP  History of tobacco abuse -Counseled on cessation -Refuses nicotine patch  Obesity -Lifestyle changes outpatient  DVT prophylaxis: Heparin Code Status: Full Family Communication: None at bedside Disposition Plan:Admit for CP evaluation Consults called:Cardiology Admission status: Obs, Tele  Richardson Dubree D Hooper [place]way DO [facility]  If 7PM-7AM, please contact night-coverage [email or web address]  05/26/2021, 12:25 PM

## 2021-06-04 NOTE — Progress Notes (Addendum)
ANTICOAGULATION CONSULT NOTE - Initial Consult  Pharmacy Consult for Heparin Indication: chest pain/ACS  No Known Allergies  Patient Measurements: Height: 5\' 3"  (160 cm) Weight: 87.1 kg (192 lb) IBW/kg (Calculated) : 52.4 HEPARIN DW (KG): 72   Vital Signs: Temp: 98.2 F (36.8 C) (06/14 1318) Temp Source: Oral (06/14 1318) BP: 107/85 (06/14 1318) Pulse Rate: 50 (06/14 1318)  Labs: Recent Labs    06/19/2021 1003 05/25/2021 1550  HGB 13.2  --   HCT 42.2  --   PLT 230  --   CREATININE 1.67*  --   TROPONINIHS 44* 206*    Estimated Creatinine Clearance: 37.5 mL/min (A) (by C-G formula based on SCr of 1.67 mg/dL (H)).   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 Cristopher Peru)   Thyroid disease    PROCEDURE FOR THYROID 15 YRS AGO AT [facility]   WPW (Wolff-Parkinson-White syndrome)     Medications:  Medications Prior to Admission  Medication Sig Dispense Refill Last Dose   acetaminophen (TYLENOL) 500 MG tablet Take 1,000 mg by mouth daily as needed for moderate pain or headache.      amLODipine (NORVASC) 5 MG tablet Take 5 mg by mouth every evening.    06/03/2021   JARDIANCE 10 MG TABS tablet Take 10 mg by mouth daily.   06/03/2021   levothyroxine (SYNTHROID) 88 MCG tablet Take 88 mcg by mouth daily.   06/03/2021   potassium chloride (KLOR-CON) 10 MEQ tablet Take 10 mEq by mouth daily.   06/03/2021   rosuvastatin (CRESTOR) 40 MG tablet Take 40 mg by mouth daily.   06/03/2021   valsartan-hydrochlorothiazide (DIOVAN-HCT) 160-25 MG tablet Take 1 tablet by mouth daily.   06/03/2021   Vitamin D, Ergocalciferol, (DRISDOL) 1.25 MG (50000 UNIT) CAPS capsule Take 1 capsule by mouth once a week.   Past Week   cyclobenzaprine (FLEXERIL) 10 MG tablet TAKE 1 TABLET BY MOUTH TWICE DAILY AS NEEDED FOR MUSCLE SPASMS  (Patient not taking: No sig reported) 40 tablet 0 Not Taking   cyclobenzaprine (FLEXERIL) 10 MG tablet TAKE 1 TABLET BY MOUTH TWICE DAILY AS NEEDED FOR MUSCLE SPASMS (Patient not taking: No sig reported) 40 tablet 0 Not Taking   cyclobenzaprine (FLEXERIL) 10 MG tablet TAKE 1 TABLET BY MOUTH TWICE DAILY AS NEEDED FOR MUSCLE SPASMS (Patient not taking: No sig reported) 40 tablet 0 Not Taking   diazepam (VALIUM) 5 MG tablet Take 1 tablet (5 mg total) by mouth 2 (two) times daily. (Patient not taking: No sig reported) 10 tablet 0 Not Taking   gabapentin (NEURONTIN) 100 MG capsule TAKE 1 CAPSULE BY MOUTH THREE TIMES DAILY (Patient not taking: No sig reported) 90 capsule 2 Not Taking   HYDROcodone-acetaminophen (NORCO/VICODIN) 5-325 MG tablet Take 1 tablet by mouth every 4 (four) hours as needed. (Patient not taking: No sig reported) 10 tablet 0 Not Taking   naproxen (NAPROSYN) 500 MG tablet TAKE 1 TABLET BY MOUTH TWICE DAILY WITH A MEAL (Patient not taking: No sig reported) 60 tablet 2 Not Taking   VOLTAREN 1 % GEL Apply 2 g topically 4 (four) times daily as needed (pain).  (Patient not taking: Reported on 05/26/2021)  5 Not Taking    Assessment: Patient presented to ED with chest pain. Patient has  elevated troponins. Reviewed home meds and not on oral anticoagulants. Pharmacy asked to start heparin. Plan for lesxiscan vs. Cardia cath.   Goal of Therapy:  Heparin level 0.3-0.7 units/ml Monitor platelets by anticoagulation protocol: Yes   Plan:  Give 4000 units bolus x 1 Start heparin infusion at 900 units/hr Check anti-Xa level in 6 hours and daily while on heparin Continue to monitor H&H and platelets  Isac Sarna, BS [place]ena [place], BCPS Clinical Pharmacist Pager [phone] 06/06/2021,5:10 PM

## 2021-06-04 NOTE — Progress Notes (Signed)
Date and time results received: 06/01/2021  1912  Test: Troponin  Critical Value: 372  Name of Provider Notified: A. Zierle-Ghosh

## 2021-06-04 NOTE — ED Notes (Signed)
Dr at bedside.

## 2021-06-04 NOTE — Progress Notes (Signed)
*  PRELIMINARY RESULTS* Echocardiogram 2D Echocardiogram has been performed.  [name] [name] 06/03/2021, 5:26 PM

## 2021-06-04 NOTE — ED Notes (Signed)
ED TO INPATIENT HANDOFF REPORT  ED Nurse Name and Phone #:   S Name/Age/Gender [name] [name] 60 y.o. female Room/Bed: APA14/APA14  Code Status   Code Status: Prior  Home/SNF/Other Home Patient oriented to: self, place, time and situation Is this baseline? Yes   Triage Complete: Triage complete  Chief Complaint Chest pain [R07.9]  Triage Note Pt arrived to ED c/o chest pain since about 8 am. Pain is a burning in chest area. Pt got up to get ready for work and noticed the pain. Mid center chest     Allergies No Known Allergies  Level of Care/Admitting Diagnosis ED Disposition    ED Disposition  Admit   Condition  --   [place]: [facility] [[id number]]  Level of Care: Telemetry [5]  Covid Evaluation: Asymptomatic Screening Protocol (No Symptoms)  Diagnosis: Chest pain [301601]  Admitting Physician: Rodena Goldmann [[id number]]  Attending Physician: Heath Lark D [[id number]]         B Medical/Surgery History Past Medical History:  Diagnosis Date  . Arthritis   . Brain aneurysm   . Headache   . Hypertension       . 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 Cristopher Peru)  . Thyroid disease    PROCEDURE FOR THYROID 15 YRS AGO AT [facility]  . WPW (Wolff-Parkinson-White syndrome)    Past Surgical History:  Procedure Laterality Date  . BREAST SURGERY     BX RT BREAST  BENIGN  . CRANIOTOMY N/A 01/04/2016   Procedure: Suboccipital Craniotomy and Cervical one Laminectomy for Clipping of Aneurysm;  Surgeon: Kevan Ny Ditty, MD;  Location: [place] [facility];  Service: Neurosurgery;  Laterality: N/A;  . CRANIOTOMY N/A 07/09/2016   Procedure: Craniotomy for clipping of left middle cerebral artery aneurysm;  Surgeon: Kevan Ny Ditty, MD;  Location: [place] [facility];  Service: Neurosurgery;  Laterality: N/A;  Craniotomy for clipping of left middle cerebral artery aneurysm  . CRANIOTOMY Right  10/24/2016   Procedure: 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 Christella Noa;  Surgeon: Kevan Ny Ditty, MD;  Location: [place];  Service: Neurosurgery;  Laterality: Right;  . ELECTROPHYSIOLOGIC STUDY    . LAPAROSCOPIC REVISION VENTRICULAR-PERITONEAL (V-P) SHUNT N/A 02/04/2016   Procedure: LAPAROSCOPIC Insertion VENTRICULAR-PERITONEAL (V-P) SHUNT;  Surgeon: Rolm Bookbinder, MD;  Location: [place] [facility];  Service: General;  Laterality: N/A;  . RADIOLOGY WITH ANESTHESIA N/A 01/03/2016   Procedure: RADIOLOGY WITH ANESTHESIA;  Surgeon: Medication Radiologist, MD;  Location: [place];  Service: Radiology;  Laterality: N/A;  . VENTRICULOPERITONEAL SHUNT Right 02/04/2016   Procedure: SHUNT INSERTION VENTRICULAR-PERITONEAL With Laparoscopic Assistance;  Surgeon: Kevan Ny Ditty, MD;  Location: [place] [facility];  Service: Neurosurgery;  Laterality: Right;     A IV Location/Drains/Wounds Patient Lines/Drains/Airways Status    Active Line/Drains/Airways    Name Placement date Placement time Site Days   Peripheral IV 06/03/2021 20 G Right Hand 05/25/2021  0937  Hand  less than 1          Intake/Output Last 24 hours No intake or output data in the 24 hours ending 06/16/2021 1229  Labs/Imaging Results for orders placed or performed during the hospital encounter of 06/06/2021 (from the past 48 hour(s))  Basic metabolic panel     Status: Abnormal   Collection Time: 05/27/2021 10:03 AM  Result Value Ref Range   Sodium 140 135 - 145 mmol/L   Potassium  3.9 3.5 - 5.1 mmol/L   Chloride 108 98 - 111 mmol/L   CO2 25 22 - 32 mmol/L   Glucose, Bld 94 70 - 99 mg/dL    Comment: Glucose reference range applies only to samples taken after fasting for at least 8 hours.   BUN 16 6 - 20 mg/dL   Creatinine, Ser 1.67 (H) 0.44 - 1.00 mg/dL   Calcium 9.0 8.9 - 10.3 mg/dL   GFR, Estimated 35 (L) >60 mL/min    Comment: (NOTE) Calculated using the CKD-EPI Creatinine Equation (2021)    Anion  gap 7 5 - 15    Comment: Performed at [facility], [address] [place], [place] [address]  CBC     Status: Abnormal   Collection Time: 06/09/2021 10:03 AM  Result Value Ref Range   WBC 7.0 4.0 - 10.5 K/uL   RBC 4.62 3.87 - 5.11 MIL/uL   Hemoglobin 13.2 12.0 - 15.0 g/dL   HCT 42.2 36.0 - 46.0 %   MCV 91.3 80.0 - 100.0 fL   MCH 28.6 26.0 - 34.0 pg   MCHC 31.3 30.0 - 36.0 g/dL   RDW 16.3 (H) 11.5 - 15.5 %   Platelets 230 150 - 400 K/uL   nRBC 0.0 0.0 - 0.2 %    Comment: Performed at [facility], [address], [place], [place] [address]  Troponin I (High Sensitivity)     Status: Abnormal   Collection Time: 05/28/2021 10:03 AM  Result Value Ref Range   Troponin I (High Sensitivity) 44 (H) <18 ng/L    Comment: (NOTE) Elevated high sensitivity troponin I (hsTnI) values and significant  changes across serial measurements may suggest ACS but many other  chronic and acute conditions are known to elevate hsTnI results.  Refer to the "Links" section for chest pain algorithms and additional  guidance. Performed at [facility], [address], [place], [place] [address]    DG Chest 1 View  Result Date: 06/06/2021 CLINICAL DATA:  Chest pain since early this morning, hypertension, smoker EXAM: CHEST  1 VIEW COMPARISON:  Portable exam 0938 hours compared to 07/21/2017 FINDINGS: VP shunt tubing traverses RIGHT hemithorax. Borderline enlargement of cardiac silhouette. Mediastinal contours and pulmonary vascularity normal. Minimal subsegmental atelectasis RIGHT upper lobe. Lungs otherwise clear. No pulmonary infiltrate, pleural effusion, or pneumothorax. Bones demineralized. IMPRESSION: Minimal subsegmental atelectasis RIGHT upper lobe. Electronically Signed   By: Lavonia Dana M.D.   On: 05/25/2021 09:52    Pending Labs Unresulted Labs (From admission, onward)    Start     Ordered   05/30/2021 1156  Resp Panel by RT-PCR (Flu A&B, Covid) Nasopharyngeal Swab  (Tier 2 - Symptomatic/asymptomatic with  Precautions )  Once,   STAT       Question Answer Comment  Is this test for diagnosis or screening Screening   Symptomatic for COVID-19 as defined by CDC No   Hospitalized for COVID-19 No   Admitted to ICU for COVID-19 No   Previously tested for COVID-19 No   Resident in a congregate (group) care setting No   Employed in healthcare setting Unknown   Pregnant No   Has patient completed COVID vaccination(s) (2 doses of Pfizer/Moderna 1 dose of [employer]) Unknown      06/09/2021 1155          Vitals/Pain Today's Vitals   06/09/2021 1140 06/03/2021 1200 06/03/2021 1224 06/18/2021 1224  BP: 122/69 117/70    Pulse: (!) 48 (!) 53  Resp: 11 18    Temp:      TempSrc:      SpO2: 98% 99%    Weight:      Height:      PainSc:   2  2     Isolation Precautions Airborne and Contact precautions  Medications Medications  acetaminophen (TYLENOL) tablet 1,000 mg (1,000 mg Oral Given 05/23/2021 1044)  famotidine (PEPCID) tablet 20 mg (20 mg Oral Given 06/06/2021 1044)  fentaNYL (SUBLIMAZE) injection 50 mcg (50 mcg Intravenous Given 05/28/2021 1046)  aspirin chewable tablet 324 mg (324 mg Oral Given 06/18/2021 1222)    Mobility walks Low fall risk   Focused Assessments    R Recommendations: See Admitting Provider Note  Report given to:   Additional Notes:

## 2021-06-04 NOTE — ED Notes (Signed)
Attempted report x1. 

## 2021-06-04 NOTE — Consult Note (Signed)
Cardiology Consultation:   Patient ID: [name] [name] MRN: [id number]; DOB: 1961-09-29  Admit date: 05/30/2021 Date of Consult: 06/06/2021  PCP:  Lucianne Lei, MD   [facility] Providers Cardiologist: Evaluated by Dr. Lovena Le in 2010 - No outpatient follow-up since  Patient Profile:   [name] [name] is a 60 y.o. female with past medical history of SVT (s/p prior ablation in 2010), HTN, HLD, Hypothyroidism and prior CVA who is being seen today for the evaluation of chest pain and bradycardia at the request of Dr. Reather Converse.  History of Present Illness:   [name] [name] presented to [facility] ED this morning for evaluation of chest pain. She reports being in her usual state of health until this morning when at around 0800 she developed a centralized chest tightness along her sternal region. She went back to bed to see if this would help but symptoms persisted. Pain was not worse with positional changes. She denies any associated radiating pain or dyspnea. Does report some nausea. No recent orthopnea, PND or pitting edema. She reports having an episode of chest pressure approximately 2 days ago but this occurred after consuming a cheeseburger and spontaneously resolved. She reports not being overly active at baseline but denies any anginal symptoms over the past several weeks when doing her routine activities.  She does smoke approximately 0.5 ppd. Also reports occasional marijuana use. Denies any alcohol use.  Initial labs show WBC 7.0, Hgb 13.2, platelets 230, Na+ 140, K+ 3.9 and creatinine 1.67 (unclear baseline as elevated to 2.20 in 07/2020 but previously 1.3 in 2020 and 1.85 in 05/2020). Initial Hs Troponin 44. CXR shows minimal subsegmental atelectasis along the right upper lobe. EKG shows sinus bradycardia, HR 54 with inferior infarct pattern which is similar to prior tracings.   She has received Pepcid and Fentanyl since arriving in the ED and reports her pain has improved but not fully  resolved.   Past Medical History:  Diagnosis Date   Arthritis    Brain aneurysm    Headache    Hypertension        SVT (supraventricular tachycardia) (HCC)    s/p radiofrequency catheter ablation for AVNRT 02/16/09 (Dr. Cristopher Peru)   Thyroid disease    PROCEDURE FOR THYROID 15 YRS AGO AT [facility]   WPW (Wolff-Parkinson-White syndrome)     Past Surgical History:  Procedure Laterality Date   BREAST SURGERY     BX RT BREAST  BENIGN   CRANIOTOMY N/A 01/04/2016   Procedure: Suboccipital Craniotomy and Cervical one Laminectomy for Clipping of Aneurysm;  Surgeon: Kevan Ny Ditty, MD;  Location: [place] [facility];  Service: Neurosurgery;  Laterality: N/A;   CRANIOTOMY N/A 07/09/2016   Procedure: Craniotomy for clipping of left middle cerebral artery aneurysm;  Surgeon: Kevan Ny Ditty, MD;  Location: [place] [facility];  Service: Neurosurgery;  Laterality: N/A;  Craniotomy for 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 Christella Noa;  Surgeon: Kevan Ny Ditty, MD;  Location: [place];  Service: Neurosurgery;  Laterality: Right;   ELECTROPHYSIOLOGIC STUDY     LAPAROSCOPIC REVISION VENTRICULAR-PERITONEAL (V-P) SHUNT N/A 02/04/2016   Procedure: LAPAROSCOPIC Insertion VENTRICULAR-PERITONEAL (V-P) SHUNT;  Surgeon: Rolm Bookbinder, MD;  Location: [place] [facility];  Service: General;  Laterality: N/A;   RADIOLOGY WITH ANESTHESIA N/A 01/03/2016   Procedure: RADIOLOGY WITH ANESTHESIA;  Surgeon: Medication Radiologist, MD;  Location: [place];  Service: Radiology;  Laterality: N/A;   VENTRICULOPERITONEAL SHUNT Right 02/04/2016   Procedure: SHUNT INSERTION VENTRICULAR-PERITONEAL With Laparoscopic Assistance;  Surgeon: Kevan Ny Ditty, MD;  Location: [place] [facility];  Service: Neurosurgery;  Laterality: Right;     Home Medications:  Prior to Admission medications   Medication Sig Start Date  End Date Taking? Authorizing Provider  acetaminophen (TYLENOL) 500 MG tablet Take 1,000 mg by mouth daily as needed for moderate pain or headache.   Yes [provider]  amLODipine (NORVASC) 5 MG tablet Take 5 mg by mouth every evening.    Yes [provider]  JARDIANCE 10 MG TABS tablet Take 10 mg by mouth daily. 03/15/21  Yes [provider]  levothyroxine (SYNTHROID) 88 MCG tablet Take 88 mcg by mouth daily. 01/28/21  Yes [provider]  potassium chloride (KLOR-CON) 10 MEQ tablet Take 10 mEq by mouth daily. 04/24/21  Yes [provider]  rosuvastatin (CRESTOR) 40 MG tablet Take 40 mg by mouth daily. 01/28/21  Yes [provider]  valsartan-hydrochlorothiazide (DIOVAN-HCT) 160-25 MG tablet Take 1 tablet by mouth daily.   Yes [provider]  Vitamin D, Ergocalciferol, (DRISDOL) 1.25 MG (50000 UNIT) CAPS capsule Take 1 capsule by mouth once a week. 04/24/21  Yes [provider]  cyclobenzaprine (FLEXERIL) 10 MG tablet TAKE 1 TABLET BY MOUTH TWICE DAILY AS NEEDED FOR MUSCLE SPASMS Patient not taking: No sig reported 06/28/18   Carole Civil, MD  cyclobenzaprine (FLEXERIL) 10 MG tablet TAKE 1 TABLET BY MOUTH TWICE DAILY AS NEEDED FOR MUSCLE SPASMS Patient not taking: No sig reported 11/04/18   Carole Civil, MD  cyclobenzaprine (FLEXERIL) 10 MG tablet TAKE 1 TABLET BY MOUTH TWICE DAILY AS NEEDED FOR MUSCLE SPASMS Patient not taking: No sig reported 01/21/19   Carole Civil, MD  diazepam (VALIUM) 5 MG tablet Take 1 tablet (5 mg total) by mouth 2 (two) times daily. Patient not taking: No sig reported 03/14/18   Isla Pence, MD  gabapentin (NEURONTIN) 100 MG capsule TAKE 1 CAPSULE BY MOUTH THREE TIMES DAILY Patient not taking: No sig reported 04/21/19   Carole Civil, MD  HYDROcodone-acetaminophen (NORCO/VICODIN) 5-325 MG tablet Take 1 tablet by mouth every 4 (four) hours as needed. Patient not taking: No sig  reported 03/14/18   Isla Pence, MD  naproxen (NAPROSYN) 500 MG tablet TAKE 1 TABLET BY MOUTH TWICE DAILY WITH A MEAL Patient not taking: No sig reported 06/29/19   Carole Civil, MD  VOLTAREN 1 % GEL Apply 2 g topically 4 (four) times daily as needed (pain).  Patient not taking: Reported on 05/29/2021 02/27/18   [provider]    Inpatient Medications: Scheduled Meds: REM  Continuous Infusions: REM PRN Meds:   Allergies:   No Known Allergies  Social History:   Social History   Socioeconomic History   Marital status: Single    Spouse name: Not on file   Number of children: Not on file   Years of education: Not on file   Highest education level: Not on file  Occupational History   Not on file  Tobacco Use   Smoking status: Every Day    Packs/day: 0.50    Pack years: 0.00    Types: Cigarettes   Smokeless tobacco: Never  Vaping Use   Vaping Use: Never used  Substance and Sexual Activity   Alcohol use: No   Drug use: No   Sexual activity: Not on file  Other Topics Concern  Not on file  Social History Narrative   Not on file   Social Determinants of Health   Financial Resource Strain: Not on file  Food Insecurity: Not on file  Transportation Needs: Not on file  Physical Activity: Not on file  Stress: Not on file  Social Connections: Not on file  Intimate Partner Violence: Not on file    Family History:    Family History  Problem Relation Age of Onset   Hypertension Mother    Prostate cancer Father    Cancer Sister    Colon cancer Neg Hx    Colon polyps Neg Hx      ROS:  Please see the history of present illness.   All other ROS reviewed and negative.     Physical Exam/Data:   Vitals:   05/22/2021 1100 05/25/2021 1120 06/15/2021 1140 06/08/2021 1200  BP: 129/77 113/65 122/69 117/70  Pulse: (!) 47 (!) 48 (!) 48 (!) 53  Resp: 20 17 11 18   Temp:      TempSrc:      SpO2: 96% 96% 98% 99%  Weight:      Height:       No intake or output  data in the 24 hours ending 05/23/2021 1242 Last 3 Weights 05/22/2021 07/04/2019 03/17/2018  Weight (lbs) 192 lb 232 lb 3.2 oz 230 lb  Weight (kg) 87.091 kg 105.325 kg 104.327 kg     Body mass index is 34.01 kg/m.  General:  Well nourished, well developed female appearing in no acute distress. HEENT: normal Lymph: no adenopathy Neck: no JVD Endocrine:  No thryomegaly Vascular: No carotid bruits; FA pulses 2+ bilaterally without bruits  Cardiac:  normal S1, S2; RRR; no murmur.  Lungs:  clear to auscultation bilaterally, no wheezing, rhonchi or rales  Abd: soft, nontender, no hepatomegaly  Ext: no pitting edema Musculoskeletal:  No deformities, BUE and BLE strength normal and equal Skin: warm and dry  Neuro:  CNs 2-12 intact, no focal abnormalities noted Psych:  Normal affect   EKG:  The EKG was personally reviewed and demonstrates: Sinus bradycardia, HR 54 with inferior infarct pattern which is similar to prior tracings.   Telemetry:  Telemetry was personally reviewed and demonstrates: Sinus bradycardia, HR mostly in 40's to 50's. Elevated into 70's at times.   Relevant CV Studies:  Echocardiogram: 12/2015 Study Conclusions   - Left ventricle: The cavity size was normal. Systolic function was    vigorous. The estimated ejection fraction was in the range of 65%    to 70%. Wall motion was normal; there were no regional wall    motion abnormalities. Left ventricular diastolic function    parameters were normal.  - Aortic valve: Transvalvular velocity was within the normal range.    There was no stenosis. There was no regurgitation.  - Mitral valve: Transvalvular velocity was within the normal range.    There was no evidence for stenosis. There was no regurgitation.  - Left atrium: The atrium was moderately dilated.  - Right ventricle: The cavity size was normal. Wall thickness was    normal. Systolic function was normal.  - Atrial septum: No defect or patent foramen ovale was  identified    by color flow Doppler.  - Tricuspid valve: There was trivial regurgitation.  - Inferior vena cava: The vessel was normal in size. The    respirophasic diameter changes were in the normal range (>= 50%),    consistent with normal central venous pressure.  Laboratory Data:  High Sensitivity Troponin:   Recent Labs  Lab 06/03/2021 1003  TROPONINIHS 44*     Chemistry Recent Labs  Lab 06/08/2021 1003  NA 140  K 3.9  CL 108  CO2 25  GLUCOSE 94  BUN 16  CREATININE 1.67*  CALCIUM 9.0  GFRNONAA 35*  ANIONGAP 7    No results for input(s): PROT, ALBUMIN, AST, ALT, ALKPHOS, BILITOT in the last 168 hours. Hematology Recent Labs  Lab 05/22/2021 1003  WBC 7.0  RBC 4.62  HGB 13.2  HCT 42.2  MCV 91.3  MCH 28.6  MCHC 31.3  RDW 16.3*  PLT 230   BNPNo results for input(s): BNP, PROBNP in the last 168 hours.  DDimer No results for input(s): DDIMER in the last 168 hours.   Radiology/Studies:  DG Chest 1 View  Result Date: 05/26/2021 CLINICAL DATA:  Chest pain since early this morning, hypertension, smoker EXAM: CHEST  1 VIEW COMPARISON:  Portable exam 0938 hours compared to 07/21/2017 FINDINGS: VP shunt tubing traverses RIGHT hemithorax. Borderline enlargement of cardiac silhouette. Mediastinal contours and pulmonary vascularity normal. Minimal subsegmental atelectasis RIGHT upper lobe. Lungs otherwise clear. No pulmonary infiltrate, pleural effusion, or pneumothorax. Bones demineralized. IMPRESSION: Minimal subsegmental atelectasis RIGHT upper lobe. Electronically Signed   By: Lavonia Dana M.D.   On: 06/09/2021 09:52     Assessment and Plan:   1. Chest Pain/Elevated Troponin Values - She reports new-onset chest tightness which occurred at rest and has been intermittent for the past 4 hours. Somewhat reproducible on palpation. Has improved with Fentanyl and Pepcid while in the ED. She reports a similar episode a few days ago after consuming a cheeseburger but not as severe  as the one today.  - Initial Hs Troponin 44.  EKG shows sinus bradycardia, HR 54 with inferior infarct pattern which is similar to prior tracings.  - Overall, her symptoms seem atypical but she will require further work-up given her initial Hs Troponin elevation. Would continue to trend values. If overall flat and echo reassuring, could consider a [employer] tomorrow morning. If enzymes trend upwards, would start Heparin and plan for a cardiac catheterization tomorrow pending reassessment of her renal function as this has been variable over the past few years.  - Continue ASA 81mg  daily and Crestor 40mg  daily. No BB given baseline bradycardia.   2. Bradycardia - No evidence of high-grade AV block by telemetry thus far. Will check TSH. Continue to follow and avoid AV nodal blocking agents.   3. History of SVT - She is s/p prior ablation in 2010 by Dr. Lovena Le. Has been in sinus rhythm thus far. Continue to follow on telemetry.   4. HTN - BP has been stable at 109/50 - 137/66 while in the ED.  - Continue PTA Amlodipine. Would hold Valsartan-HCTZ for now given her elevated creatinine.   5. HLD - Would recheck FLP. She remains on Crestor 40mg  daily.   6. Stage 3 CKD - Creatinine was previously at 2.20 in 07/2020, at 1.67 on admission. Repeat BMET in AM.    Risk Assessment/Risk Scores:     TIMI Risk Score for Unstable Angina or Non-ST Elevation MI:   The patient's TIMI risk score is 4, which indicates a 20% risk of all cause mortality, new or recurrent myocardial infarction or need for urgent revascularization in the next 14 days.     For questions or updates, please contact [place] Please consult [email or web address] for contact info under  Signed, Erma Heritage, PA-C  06/20/2021 12:42 PM

## 2021-06-04 NOTE — Progress Notes (Signed)
Date and time results received: 06/15/2021 1648  Test: Troponin Critical Value: 206  Name of Provider Notified: Dr. Manuella Ghazi

## 2021-06-04 NOTE — ED Provider Notes (Signed)
[facility] EMERGENCY DEPARTMENT Provider Note   CSN: [id number] Arrival date & time: 06/09/2021  9476     History Chief Complaint  Patient presents with   Chest Pain    [name] [name] is a 60 y.o. female.  Patient presents to the emergency department after waking up this morning and noticing pain in her right chest.  She reports that she was sleeping before this and it woke her up.  She took a times but it has not resolved.  She reports that the pain is a 7 out of 10 on the pain scale.  She describes it as a "tightness" but also notes that she is tender to touch.  She reports that it does not radiate anywhere.  Denies any pain with respirations.  Denies any history of blood clots, DVTs, cancers.  Patient does have a history of SVT s/p ablation per electrophysiologist.  Most recent echocardiogram was in 2017 that showed LVEF of 65-70% with no abnormal regional wall motion.  Denies any recent illnesses, cough, congestion, runny nose, fever, shortness of breath, abdominal pain, nausea, vomiting, diarrhea, constipation.     Past Medical History:  Diagnosis Date   Arthritis    Brain aneurysm    Headache    Hypertension        SVT (supraventricular tachycardia) (HCC)    s/p radiofrequency catheter ablation for AVNRT 02/16/09 (Dr. Cristopher Peru)   Thyroid disease    PROCEDURE FOR THYROID 15 YRS AGO AT [facility]   WPW (Wolff-Parkinson-White syndrome)     Patient Active Problem List   Diagnosis Date Noted   Encounter for screening colonoscopy for non-high-risk patient 07/04/2019   Polypharmacy 07/04/2019   Severe obesity (BMI >= 40) ([place]) 07/04/2019   Left hemiparesis ([place]) 11/17/2016   Idiopathic hypotension    Cognitive deficit, post-stroke    CVA (cerebral vascular accident) ([place]) 10/29/2016   Stroke due to embolism of middle cerebral artery ([place]) 10/29/2016   Seizure prophylaxis    History of supraventricular tachycardia    Neurologic abnormality    Stroke syndrome    Vascular  headache    Other secondary hypertension    Hypokalemia    AKI (acute kidney injury) ([place])    Lymphocytosis    Lethargy    Brain edema ([place])    Intracranial aneurysm 07/09/2016   Communicating hydrocephalus ([place]) 02/03/2016   Essential hypertension    Paroxysmal SVT (supraventricular tachycardia) (HCC)    Paroxysmal atrial fibrillation (HCC)    Tobacco abuse    Tachypnea    Bradycardia    Leukocytosis    Acute blood loss anemia    Thrombocytosis    Chest pain    Nontraumatic subarachnoid hemorrhage ([place])    Acute respiratory failure ([place])    Encounter for central line placement    Hypoxemia    Renal insufficiency    Encounter for imaging study to confirm nasogastric (NG) tube placement    Endotracheally intubated    Respiratory failure ([place])    SAH (subarachnoid hemorrhage) ([place])    Subarachnoid hemorrhage due to ruptured aneurysm ([place]) 01/03/2016   Subarachnoid hemorrhage ([place]) 01/03/2016   Atrial fibrillation with rapid ventricular response (HCC)    Elevated lactic acid level    SMOKER 03/20/2009   HYPERTENSION, UNSPECIFIED 03/20/2009   WPW 03/20/2009   SVT/ PSVT/ PAT 03/20/2009   BRONCHITIS 03/20/2009    Past Surgical History:  Procedure Laterality Date   BREAST SURGERY     BX RT BREAST  BENIGN  CRANIOTOMY N/A 01/04/2016   Procedure: Suboccipital Craniotomy and Cervical one Laminectomy for Clipping of Aneurysm;  Surgeon: Kevan Ny Ditty, MD;  Location: [place] [facility];  Service: Neurosurgery;  Laterality: N/A;   CRANIOTOMY N/A 07/09/2016   Procedure: Craniotomy for clipping of left middle cerebral artery aneurysm;  Surgeon: Kevan Ny Ditty, MD;  Location: [place] [facility];  Service: Neurosurgery;  Laterality: N/A;  Craniotomy for 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 Christella Noa;  Surgeon: Kevan Ny Ditty, MD;  Location: [place];   Service: Neurosurgery;  Laterality: Right;   ELECTROPHYSIOLOGIC STUDY     LAPAROSCOPIC REVISION VENTRICULAR-PERITONEAL (V-P) SHUNT N/A 02/04/2016   Procedure: LAPAROSCOPIC Insertion VENTRICULAR-PERITONEAL (V-P) SHUNT;  Surgeon: Rolm Bookbinder, MD;  Location: [place] [facility];  Service: General;  Laterality: N/A;   RADIOLOGY WITH ANESTHESIA N/A 01/03/2016   Procedure: RADIOLOGY WITH ANESTHESIA;  Surgeon: Medication Radiologist, MD;  Location: [place];  Service: Radiology;  Laterality: N/A;   VENTRICULOPERITONEAL SHUNT Right 02/04/2016   Procedure: SHUNT INSERTION VENTRICULAR-PERITONEAL With Laparoscopic Assistance;  Surgeon: Kevan Ny Ditty, MD;  Location: [place] [facility];  Service: Neurosurgery;  Laterality: Right;     OB History   No obstetric history on file.     Family History  Problem Relation Age of Onset   Hypertension Mother    Prostate cancer Father    Cancer Sister    Colon cancer Neg Hx    Colon polyps Neg Hx     Social History   Tobacco Use   Smoking status: Every Day    Packs/day: 0.50    Pack years: 0.00    Types: Cigarettes   Smokeless tobacco: Never  Vaping Use   Vaping Use: Never used  Substance Use Topics   Alcohol use: No   Drug use: No    Home Medications Prior to Admission medications   Medication Sig Start Date End Date Taking? Authorizing Provider  acetaminophen (TYLENOL) 500 MG tablet Take 1,000 mg by mouth daily as needed for moderate pain or headache.   Yes [provider]  amLODipine (NORVASC) 5 MG tablet Take 5 mg by mouth every evening.    Yes [provider]  JARDIANCE 10 MG TABS tablet Take 10 mg by mouth daily. 03/15/21  Yes [provider]  levothyroxine (SYNTHROID) 88 MCG tablet Take 88 mcg by mouth daily. 01/28/21  Yes [provider]  potassium chloride (KLOR-CON) 10 MEQ tablet Take 10 mEq by mouth daily. 04/24/21  Yes [provider]  rosuvastatin (CRESTOR) 40 MG tablet Take 40 mg by mouth daily.  01/28/21  Yes [provider]  valsartan-hydrochlorothiazide (DIOVAN-HCT) 160-25 MG tablet Take 1 tablet by mouth daily.   Yes [provider]  Vitamin D, Ergocalciferol, (DRISDOL) 1.25 MG (50000 UNIT) CAPS capsule Take 1 capsule by mouth once a week. 04/24/21  Yes [provider]  cyclobenzaprine (FLEXERIL) 10 MG tablet TAKE 1 TABLET BY MOUTH TWICE DAILY AS NEEDED FOR MUSCLE SPASMS Patient not taking: No sig reported 06/28/18   Carole Civil, MD  cyclobenzaprine (FLEXERIL) 10 MG tablet TAKE 1 TABLET BY MOUTH TWICE DAILY AS NEEDED FOR MUSCLE SPASMS Patient not taking: No sig reported 11/04/18   Carole Civil, MD  cyclobenzaprine (FLEXERIL) 10 MG tablet TAKE 1 TABLET BY MOUTH TWICE DAILY AS NEEDED FOR MUSCLE SPASMS Patient not taking: No sig reported 01/21/19   Arther Abbott  E, MD  diazepam (VALIUM) 5 MG tablet Take 1 tablet (5 mg total) by mouth 2 (two) times daily. Patient not taking: No sig reported 03/14/18   Isla Pence, MD  gabapentin (NEURONTIN) 100 MG capsule TAKE 1 CAPSULE BY MOUTH THREE TIMES DAILY Patient not taking: No sig reported 04/21/19   Carole Civil, MD  HYDROcodone-acetaminophen (NORCO/VICODIN) 5-325 MG tablet Take 1 tablet by mouth every 4 (four) hours as needed. Patient not taking: No sig reported 03/14/18   Isla Pence, MD  naproxen (NAPROSYN) 500 MG tablet TAKE 1 TABLET BY MOUTH TWICE DAILY WITH A MEAL Patient not taking: No sig reported 06/29/19   Carole Civil, MD  VOLTAREN 1 % GEL Apply 2 g topically 4 (four) times daily as needed (pain).  Patient not taking: Reported on 06/08/2021 02/27/18   [provider]    Allergies    Patient has no known allergies.  Review of Systems   Review of Systems  Constitutional:  Negative for chills and fever.  HENT:  Negative for congestion and rhinorrhea.   Eyes:  Negative for visual disturbance.  Respiratory:  Negative for shortness of breath.   Cardiovascular:  Positive  for chest pain.  Gastrointestinal:  Negative for abdominal pain, constipation and diarrhea.  Genitourinary:  Negative for decreased urine volume and frequency.  Musculoskeletal:  Negative for arthralgias and gait problem.  Skin:  Negative for rash.  Neurological:  Negative for headaches.    Physical Exam Updated Vital Signs BP 129/77   Pulse (!) 47   Temp (!) 97.4 F (36.3 C) (Oral)   Resp 20   Ht 5\' 3"  (1.6 m)   Wt 87.1 kg   SpO2 96%   BMI 34.01 kg/m   Physical Exam Vitals reviewed.  Constitutional:      General: She is not in acute distress.    Appearance: Normal appearance.  HENT:     Head: Normocephalic and atraumatic.     Nose: Nose normal.     Mouth/Throat:     Mouth: Mucous membranes are moist.  Eyes:     Extraocular Movements: Extraocular movements intact.  Cardiovascular:     Rate and Rhythm: Regular rhythm. Bradycardia present.     Pulses: Normal pulses.     Heart sounds: Normal heart sounds.  Pulmonary:     Effort: Pulmonary effort is normal.     Breath sounds: Normal breath sounds.  Abdominal:     General: Abdomen is flat. There is no distension.     Palpations: Abdomen is soft.  Musculoskeletal:        General: Tenderness (Tenderness to palpation right costosternal border) present.     Cervical back: Normal range of motion and neck supple.     Right lower leg: No edema.     Left lower leg: No edema.  Skin:    General: Skin is warm and dry.     Capillary Refill: Capillary refill takes less than 2 seconds.  Neurological:     General: No focal deficit present.     Mental Status: She is alert.  Psychiatric:        Mood and Affect: Mood normal.    ED Results / Procedures / Treatments   Labs (all labs ordered are listed, but only abnormal results are displayed) Labs Reviewed  BASIC METABOLIC PANEL - Abnormal; Notable for the following components:      Result Value   Creatinine, Ser 1.67 (*)    GFR, Estimated 35 (*)  All other components  within normal limits  CBC - Abnormal; Notable for the following components:   RDW 16.3 (*)    All other components within normal limits  TROPONIN I (HIGH SENSITIVITY) - Abnormal; Notable for the following components:   Troponin I (High Sensitivity) 44 (*)    All other components within normal limits  TROPONIN I (HIGH SENSITIVITY)    EKG EKG Interpretation  Date/Time:  Tuesday June 04 2021 09:02:31 EDT Ventricular Rate:  47 PR Interval:  135 QRS Duration: 111 QT Interval:  450 QTC Calculation: 398 R Axis:   84 Text Interpretation: Sinus bradycardia Inferior infarct, old Confirmed by Elnora Morrison [phone]) on 06/09/2021 9:19:26 AM  Radiology DG Chest 1 View  Result Date: 06/16/2021 CLINICAL DATA:  Chest pain since early this morning, hypertension, smoker EXAM: CHEST  1 VIEW COMPARISON:  Portable exam 0938 hours compared to 07/21/2017 FINDINGS: VP shunt tubing traverses RIGHT hemithorax. Borderline enlargement of cardiac silhouette. Mediastinal contours and pulmonary vascularity normal. Minimal subsegmental atelectasis RIGHT upper lobe. Lungs otherwise clear. No pulmonary infiltrate, pleural effusion, or pneumothorax. Bones demineralized. IMPRESSION: Minimal subsegmental atelectasis RIGHT upper lobe. Electronically Signed   By: Lavonia Dana M.D.   On: 06/03/2021 09:52    Procedures Procedures   Medications Ordered in ED Medications  acetaminophen (TYLENOL) tablet 1,000 mg (1,000 mg Oral Given 06/06/2021 1044)  famotidine (PEPCID) tablet 20 mg (20 mg Oral Given 06/02/2021 1044)  fentaNYL (SUBLIMAZE) injection 50 mcg (50 mcg Intravenous Given 05/26/2021 1046)    ED Course  I have reviewed the triage vital signs and the nursing notes.  Pertinent labs & imaging results that were available during my care of the patient were reviewed by me and considered in my medical decision making (see chart for details).    MDM Rules/Calculators/A&P                          60 year old female presenting  to the emergency department with new onset chest pain at approximately 8 AM.  Patient's vital signs stable on arrival with bradycardia in the 50s but normotensive.  Physical exam showed tenderness to palpation on right costosternal border.  EKG showed sinus bradycardia.  Troponin significant for elevation at 44.  CXR within minimal subsegmental atelectasis on the right upper lobe.  On reevaluation the patient was complaining of worsening chest pain.  She was given Tylenol, Pepcid, fentanyl.  She is also given 324 mg ASA.  Troponin was trended and cardiology was consulted.  Spoke with cardiology and they reported that they would see the patient but did not provide any recommendations at this time.  Patient was admitted for observation.  Hospitalist team consulted.  Final Clinical Impression(s) / ED Diagnoses Final diagnoses:  None    Rx / DC Orders ED Discharge Orders     None        Gifford Shave, MD 06/06/2021 1136    Elnora Morrison, MD 05/29/2021 1640

## 2021-06-04 NOTE — ED Triage Notes (Signed)
Pt arrived to ED c/o chest pain since about 8 am. Pain is a burning in chest area. Pt got up to get ready for work and noticed the pain. Mid center chest

## 2021-06-05 ENCOUNTER — Encounter (HOSPITAL_COMMUNITY)
Admission: EM | Disposition: E | Payer: Self-pay | Source: Home / Self Care | Attending: Thoracic Surgery (Cardiothoracic Vascular Surgery)

## 2021-06-05 ENCOUNTER — Other Ambulatory Visit: Payer: Self-pay

## 2021-06-05 ENCOUNTER — Observation Stay (HOSPITAL_COMMUNITY): Payer: 59

## 2021-06-05 ENCOUNTER — Observation Stay (HOSPITAL_COMMUNITY): Payer: 59 | Admitting: Certified Registered Nurse Anesthetist

## 2021-06-05 ENCOUNTER — Encounter (HOSPITAL_COMMUNITY): Payer: Self-pay | Admitting: Cardiovascular Disease

## 2021-06-05 ENCOUNTER — Inpatient Hospital Stay (HOSPITAL_COMMUNITY): Payer: 59

## 2021-06-05 DIAGNOSIS — I213 ST elevation (STEMI) myocardial infarction of unspecified site: Secondary | ICD-10-CM | POA: Diagnosis not present

## 2021-06-05 DIAGNOSIS — R570 Cardiogenic shock: Secondary | ICD-10-CM | POA: Diagnosis present

## 2021-06-05 DIAGNOSIS — I469 Cardiac arrest, cause unspecified: Secondary | ICD-10-CM | POA: Diagnosis not present

## 2021-06-05 DIAGNOSIS — I509 Heart failure, unspecified: Secondary | ICD-10-CM | POA: Diagnosis present

## 2021-06-05 DIAGNOSIS — Z4509 Encounter for adjustment and management of other cardiac device: Secondary | ICD-10-CM | POA: Diagnosis not present

## 2021-06-05 DIAGNOSIS — N170 Acute kidney failure with tubular necrosis: Secondary | ICD-10-CM | POA: Diagnosis not present

## 2021-06-05 DIAGNOSIS — I314 Cardiac tamponade: Secondary | ICD-10-CM | POA: Diagnosis not present

## 2021-06-05 DIAGNOSIS — Y840 Cardiac catheterization as the cause of abnormal reaction of the patient, or of later complication, without mention of misadventure at the time of the procedure: Secondary | ICD-10-CM | POA: Diagnosis not present

## 2021-06-05 DIAGNOSIS — I4891 Unspecified atrial fibrillation: Secondary | ICD-10-CM | POA: Diagnosis not present

## 2021-06-05 DIAGNOSIS — R092 Respiratory arrest: Secondary | ICD-10-CM | POA: Diagnosis not present

## 2021-06-05 DIAGNOSIS — I214 Non-ST elevation (NSTEMI) myocardial infarction: Secondary | ICD-10-CM

## 2021-06-05 DIAGNOSIS — J80 Acute respiratory distress syndrome: Secondary | ICD-10-CM | POA: Diagnosis not present

## 2021-06-05 DIAGNOSIS — I130 Hypertensive heart and chronic kidney disease with heart failure and stage 1 through stage 4 chronic kidney disease, or unspecified chronic kidney disease: Secondary | ICD-10-CM | POA: Diagnosis present

## 2021-06-05 DIAGNOSIS — D680 Von Willebrand's disease: Secondary | ICD-10-CM | POA: Diagnosis not present

## 2021-06-05 DIAGNOSIS — Z9281 Personal history of extracorporeal membrane oxygenation (ECMO): Secondary | ICD-10-CM | POA: Diagnosis not present

## 2021-06-05 DIAGNOSIS — R079 Chest pain, unspecified: Secondary | ICD-10-CM | POA: Diagnosis present

## 2021-06-05 DIAGNOSIS — Z7189 Other specified counseling: Secondary | ICD-10-CM | POA: Diagnosis not present

## 2021-06-05 DIAGNOSIS — A419 Sepsis, unspecified organism: Secondary | ICD-10-CM | POA: Diagnosis not present

## 2021-06-05 DIAGNOSIS — G9349 Other encephalopathy: Secondary | ICD-10-CM | POA: Diagnosis present

## 2021-06-05 DIAGNOSIS — N179 Acute kidney failure, unspecified: Secondary | ICD-10-CM | POA: Diagnosis not present

## 2021-06-05 DIAGNOSIS — K7200 Acute and subacute hepatic failure without coma: Secondary | ICD-10-CM | POA: Diagnosis not present

## 2021-06-05 DIAGNOSIS — I9789 Other postprocedural complications and disorders of the circulatory system, not elsewhere classified: Secondary | ICD-10-CM | POA: Diagnosis not present

## 2021-06-05 DIAGNOSIS — J810 Acute pulmonary edema: Secondary | ICD-10-CM | POA: Diagnosis not present

## 2021-06-05 DIAGNOSIS — Z66 Do not resuscitate: Secondary | ICD-10-CM | POA: Diagnosis not present

## 2021-06-05 DIAGNOSIS — Z515 Encounter for palliative care: Secondary | ICD-10-CM | POA: Diagnosis not present

## 2021-06-05 DIAGNOSIS — E1165 Type 2 diabetes mellitus with hyperglycemia: Secondary | ICD-10-CM | POA: Diagnosis present

## 2021-06-05 DIAGNOSIS — E669 Obesity, unspecified: Secondary | ICD-10-CM | POA: Diagnosis present

## 2021-06-05 DIAGNOSIS — T81718A Complication of other artery following a procedure, not elsewhere classified, initial encounter: Secondary | ICD-10-CM | POA: Diagnosis not present

## 2021-06-05 DIAGNOSIS — Z6834 Body mass index (BMI) 34.0-34.9, adult: Secondary | ICD-10-CM | POA: Diagnosis not present

## 2021-06-05 DIAGNOSIS — Z951 Presence of aortocoronary bypass graft: Secondary | ICD-10-CM | POA: Diagnosis not present

## 2021-06-05 DIAGNOSIS — R001 Bradycardia, unspecified: Secondary | ICD-10-CM | POA: Diagnosis not present

## 2021-06-05 DIAGNOSIS — I272 Pulmonary hypertension, unspecified: Secondary | ICD-10-CM | POA: Diagnosis present

## 2021-06-05 DIAGNOSIS — I2510 Atherosclerotic heart disease of native coronary artery without angina pectoris: Secondary | ICD-10-CM

## 2021-06-05 DIAGNOSIS — E039 Hypothyroidism, unspecified: Secondary | ICD-10-CM | POA: Diagnosis present

## 2021-06-05 DIAGNOSIS — R0789 Other chest pain: Secondary | ICD-10-CM | POA: Diagnosis not present

## 2021-06-05 DIAGNOSIS — I2542 Coronary artery dissection: Secondary | ICD-10-CM | POA: Diagnosis not present

## 2021-06-05 DIAGNOSIS — J942 Hemothorax: Secondary | ICD-10-CM | POA: Diagnosis not present

## 2021-06-05 DIAGNOSIS — D6959 Other secondary thrombocytopenia: Secondary | ICD-10-CM | POA: Diagnosis present

## 2021-06-05 DIAGNOSIS — I313 Pericardial effusion (noninflammatory): Secondary | ICD-10-CM | POA: Diagnosis not present

## 2021-06-05 DIAGNOSIS — Z20822 Contact with and (suspected) exposure to covid-19: Secondary | ICD-10-CM | POA: Diagnosis not present

## 2021-06-05 DIAGNOSIS — I462 Cardiac arrest due to underlying cardiac condition: Secondary | ICD-10-CM | POA: Diagnosis not present

## 2021-06-05 HISTORY — PX: LEFT HEART CATH AND CORONARY ANGIOGRAPHY: CATH118249

## 2021-06-05 HISTORY — PX: CORONARY ARTERY BYPASS GRAFT: SHX141

## 2021-06-05 HISTORY — PX: VENTRICULAR ASSIST DEVICE INSERTION: CATH118273

## 2021-06-05 HISTORY — PX: TEE WITHOUT CARDIOVERSION: SHX5443

## 2021-06-05 LAB — POCT I-STAT 7, (LYTES, BLD GAS, ICA,H+H)
Acid-base deficit: 17 mmol/L — ABNORMAL HIGH (ref 0.0–2.0)
Acid-base deficit: 9 mmol/L — ABNORMAL HIGH (ref 0.0–2.0)
Acid-base deficit: 3 mmol/L — ABNORMAL HIGH (ref 0.0–2.0)
Acid-base deficit: 2 mmol/L (ref 0.0–2.0)
Acid-base deficit: 4 mmol/L — ABNORMAL HIGH (ref 0.0–2.0)
Acid-base deficit: 4 mmol/L — ABNORMAL HIGH (ref 0.0–2.0)
Acid-base deficit: 6 mmol/L — ABNORMAL HIGH (ref 0.0–2.0)
Acid-base deficit: 7 mmol/L — ABNORMAL HIGH (ref 0.0–2.0)
Bicarbonate: 12.2 mmol/L — ABNORMAL LOW (ref 20.0–28.0)
Bicarbonate: 15 mmol/L — ABNORMAL LOW (ref 20.0–28.0)
Bicarbonate: 22.3 mmol/L (ref 20.0–28.0)
Bicarbonate: 23 mmol/L (ref 20.0–28.0)
Bicarbonate: 22.4 mmol/L (ref 20.0–28.0)
Bicarbonate: 19 mmol/L — ABNORMAL LOW (ref 20.0–28.0)
Bicarbonate: 20.7 mmol/L (ref 20.0–28.0)
Bicarbonate: 19.2 mmol/L — ABNORMAL LOW (ref 20.0–28.0)
Calcium, Ion: 1.16 mmol/L (ref 1.15–1.40)
Calcium, Ion: 1.18 mmol/L (ref 1.15–1.40)
Calcium, Ion: 0.96 mmol/L — ABNORMAL LOW (ref 1.15–1.40)
Calcium, Ion: 1.01 mmol/L — ABNORMAL LOW (ref 1.15–1.40)
Calcium, Ion: 1.15 mmol/L (ref 1.15–1.40)
Calcium, Ion: 0.96 mmol/L — ABNORMAL LOW (ref 1.15–1.40)
Calcium, Ion: 1.08 mmol/L — ABNORMAL LOW (ref 1.15–1.40)
Calcium, Ion: 1.06 mmol/L — ABNORMAL LOW (ref 1.15–1.40)
HCT: 21 % — ABNORMAL LOW (ref 36.0–46.0)
HCT: 39 % (ref 36.0–46.0)
HCT: 23 % — ABNORMAL LOW (ref 36.0–46.0)
HCT: 27 % — ABNORMAL LOW (ref 36.0–46.0)
HCT: 24 % — ABNORMAL LOW (ref 36.0–46.0)
HCT: 22 % — ABNORMAL LOW (ref 36.0–46.0)
HCT: 27 % — ABNORMAL LOW (ref 36.0–46.0)
HCT: 36 % (ref 36.0–46.0)
Hemoglobin: 13.3 g/dL (ref 12.0–15.0)
Hemoglobin: 7.1 g/dL — ABNORMAL LOW (ref 12.0–15.0)
Hemoglobin: 7.8 g/dL — ABNORMAL LOW (ref 12.0–15.0)
Hemoglobin: 9.2 g/dL — ABNORMAL LOW (ref 12.0–15.0)
Hemoglobin: 8.2 g/dL — ABNORMAL LOW (ref 12.0–15.0)
Hemoglobin: 7.5 g/dL — ABNORMAL LOW (ref 12.0–15.0)
Hemoglobin: 9.2 g/dL — ABNORMAL LOW (ref 12.0–15.0)
Hemoglobin: 12.2 g/dL (ref 12.0–15.0)
O2 Saturation: 100 %
O2 Saturation: 79 %
O2 Saturation: 100 %
O2 Saturation: 100 %
O2 Saturation: 100 %
O2 Saturation: 100 %
O2 Saturation: 100 %
O2 Saturation: 100 %
Patient temperature: 36.4
Potassium: 3.7 mmol/L (ref 3.5–5.1)
Potassium: 4.6 mmol/L (ref 3.5–5.1)
Potassium: 5 mmol/L (ref 3.5–5.1)
Potassium: 5.6 mmol/L — ABNORMAL HIGH (ref 3.5–5.1)
Potassium: 4.1 mmol/L (ref 3.5–5.1)
Potassium: 3.6 mmol/L (ref 3.5–5.1)
Potassium: 3.7 mmol/L (ref 3.5–5.1)
Potassium: 3.1 mmol/L — ABNORMAL LOW (ref 3.5–5.1)
Sodium: 132 mmol/L — ABNORMAL LOW (ref 135–145)
Sodium: 137 mmol/L (ref 135–145)
Sodium: 140 mmol/L (ref 135–145)
Sodium: 139 mmol/L (ref 135–145)
Sodium: 142 mmol/L (ref 135–145)
Sodium: 143 mmol/L (ref 135–145)
Sodium: 143 mmol/L (ref 135–145)
Sodium: 144 mmol/L (ref 135–145)
TCO2: 13 mmol/L — ABNORMAL LOW (ref 22–32)
TCO2: 16 mmol/L — ABNORMAL LOW (ref 22–32)
TCO2: 24 mmol/L (ref 22–32)
TCO2: 24 mmol/L (ref 22–32)
TCO2: 24 mmol/L (ref 22–32)
TCO2: 20 mmol/L — ABNORMAL LOW (ref 22–32)
TCO2: 22 mmol/L (ref 22–32)
TCO2: 20 mmol/L — ABNORMAL LOW (ref 22–32)
pCO2 arterial: 28 mmHg — ABNORMAL LOW (ref 32.0–48.0)
pCO2 arterial: 42.4 mmHg (ref 32.0–48.0)
pCO2 arterial: 41 mmHg (ref 32.0–48.0)
pCO2 arterial: 39.7 mmHg (ref 32.0–48.0)
pCO2 arterial: 45.4 mmHg (ref 32.0–48.0)
pCO2 arterial: 32.9 mmHg (ref 32.0–48.0)
pCO2 arterial: 33.5 mmHg (ref 32.0–48.0)
pCO2 arterial: 37 mmHg (ref 32.0–48.0)
pH, Arterial: 7.066 — CL (ref 7.350–7.450)
pH, Arterial: 7.336 — ABNORMAL LOW (ref 7.350–7.450)
pH, Arterial: 7.343 — ABNORMAL LOW (ref 7.350–7.450)
pH, Arterial: 7.371 (ref 7.350–7.450)
pH, Arterial: 7.301 — ABNORMAL LOW (ref 7.350–7.450)
pH, Arterial: 7.36 (ref 7.350–7.450)
pH, Arterial: 7.407 (ref 7.350–7.450)
pH, Arterial: 7.32 — ABNORMAL LOW (ref 7.350–7.450)
pO2, Arterial: 379 mmHg — ABNORMAL HIGH (ref 83.0–108.0)
pO2, Arterial: 46 mmHg — ABNORMAL LOW (ref 83.0–108.0)
pO2, Arterial: 341 mmHg — ABNORMAL HIGH (ref 83.0–108.0)
pO2, Arterial: 354 mmHg — ABNORMAL HIGH (ref 83.0–108.0)
pO2, Arterial: 407 mmHg — ABNORMAL HIGH (ref 83.0–108.0)
pO2, Arterial: 386 mmHg — ABNORMAL HIGH (ref 83.0–108.0)
pO2, Arterial: 429 mmHg — ABNORMAL HIGH (ref 83.0–108.0)
pO2, Arterial: 401 mmHg — ABNORMAL HIGH (ref 83.0–108.0)

## 2021-06-05 LAB — POCT I-STAT, CHEM 8
BUN: 13 mg/dL (ref 6–20)
BUN: 16 mg/dL (ref 6–20)
BUN: 13 mg/dL (ref 6–20)
BUN: 15 mg/dL (ref 6–20)
BUN: 15 mg/dL (ref 6–20)
BUN: 16 mg/dL (ref 6–20)
Calcium, Ion: 1.15 mmol/L (ref 1.15–1.40)
Calcium, Ion: >2.5 mmol/L (ref 1.15–1.40)
Calcium, Ion: 0.8 mmol/L — CL (ref 1.15–1.40)
Calcium, Ion: 1 mmol/L — ABNORMAL LOW (ref 1.15–1.40)
Calcium, Ion: >2.5 mmol/L (ref 1.15–1.40)
Calcium, Ion: 1.13 mmol/L — ABNORMAL LOW (ref 1.15–1.40)
Chloride: 106 mmol/L (ref 98–111)
Chloride: 117 mmol/L — ABNORMAL HIGH (ref 98–111)
Chloride: 100 mmol/L (ref 98–111)
Chloride: 103 mmol/L (ref 98–111)
Chloride: 112 mmol/L — ABNORMAL HIGH (ref 98–111)
Chloride: 116 mmol/L — ABNORMAL HIGH (ref 98–111)
Creatinine, Ser: 1.1 mg/dL — ABNORMAL HIGH (ref 0.44–1.00)
Creatinine, Ser: 1.3 mg/dL — ABNORMAL HIGH (ref 0.44–1.00)
Creatinine, Ser: 1.3 mg/dL — ABNORMAL HIGH (ref 0.44–1.00)
Creatinine, Ser: 1.5 mg/dL — ABNORMAL HIGH (ref 0.44–1.00)
Creatinine, Ser: 1.4 mg/dL — ABNORMAL HIGH (ref 0.44–1.00)
Creatinine, Ser: 1.4 mg/dL — ABNORMAL HIGH (ref 0.44–1.00)
Glucose, Bld: 259 mg/dL — ABNORMAL HIGH (ref 70–99)
Glucose, Bld: 321 mg/dL — ABNORMAL HIGH (ref 70–99)
Glucose, Bld: 274 mg/dL — ABNORMAL HIGH (ref 70–99)
Glucose, Bld: 244 mg/dL — ABNORMAL HIGH (ref 70–99)
Glucose, Bld: 202 mg/dL — ABNORMAL HIGH (ref 70–99)
Glucose, Bld: 208 mg/dL — ABNORMAL HIGH (ref 70–99)
HCT: 31 % — ABNORMAL LOW (ref 36.0–46.0)
HCT: 39 % (ref 36.0–46.0)
HCT: 22 % — ABNORMAL LOW (ref 36.0–46.0)
HCT: 24 % — ABNORMAL LOW (ref 36.0–46.0)
HCT: 25 % — ABNORMAL LOW (ref 36.0–46.0)
HCT: 25 % — ABNORMAL LOW (ref 36.0–46.0)
Hemoglobin: 10.5 g/dL — ABNORMAL LOW (ref 12.0–15.0)
Hemoglobin: 13.3 g/dL (ref 12.0–15.0)
Hemoglobin: 7.5 g/dL — ABNORMAL LOW (ref 12.0–15.0)
Hemoglobin: 8.2 g/dL — ABNORMAL LOW (ref 12.0–15.0)
Hemoglobin: 8.5 g/dL — ABNORMAL LOW (ref 12.0–15.0)
Hemoglobin: 8.5 g/dL — ABNORMAL LOW (ref 12.0–15.0)
Potassium: 4.5 mmol/L (ref 3.5–5.1)
Potassium: 4.6 mmol/L (ref 3.5–5.1)
Potassium: 4.4 mmol/L (ref 3.5–5.1)
Potassium: 5.7 mmol/L — ABNORMAL HIGH (ref 3.5–5.1)
Potassium: 5 mmol/L (ref 3.5–5.1)
Potassium: 3.8 mmol/L (ref 3.5–5.1)
Sodium: 135 mmol/L (ref 135–145)
Sodium: 136 mmol/L (ref 135–145)
Sodium: 137 mmol/L (ref 135–145)
Sodium: 139 mmol/L (ref 135–145)
Sodium: 141 mmol/L (ref 135–145)
Sodium: 141 mmol/L (ref 135–145)
TCO2: 14 mmol/L — ABNORMAL LOW (ref 22–32)
TCO2: 16 mmol/L — ABNORMAL LOW (ref 22–32)
TCO2: 20 mmol/L — ABNORMAL LOW (ref 22–32)
TCO2: 24 mmol/L (ref 22–32)
TCO2: 23 mmol/L (ref 22–32)
TCO2: 25 mmol/L (ref 22–32)

## 2021-06-05 LAB — PREPARE FRESH FROZEN PLASMA
Unit division: 0
Unit division: 0

## 2021-06-05 LAB — BASIC METABOLIC PANEL
Anion gap: 6 (ref 5–15)
Anion gap: 13 (ref 5–15)
BUN: 16 mg/dL (ref 6–20)
BUN: 13 mg/dL (ref 6–20)
CO2: 23 mmol/L (ref 22–32)
CO2: 17 mmol/L — ABNORMAL LOW (ref 22–32)
Calcium: 8.9 mg/dL (ref 8.9–10.3)
Calcium: 7.4 mg/dL — ABNORMAL LOW (ref 8.9–10.3)
Chloride: 110 mmol/L (ref 98–111)
Chloride: 112 mmol/L — ABNORMAL HIGH (ref 98–111)
Creatinine, Ser: 1.5 mg/dL — ABNORMAL HIGH (ref 0.44–1.00)
Creatinine, Ser: 1.76 mg/dL — ABNORMAL HIGH (ref 0.44–1.00)
GFR, Estimated: 40 mL/min — ABNORMAL LOW (ref >60)
GFR, Estimated: 33 mL/min — ABNORMAL LOW (ref >60)
Glucose, Bld: 81 mg/dL (ref 70–99)
Glucose, Bld: 207 mg/dL — ABNORMAL HIGH (ref 70–99)
Potassium: 3.8 mmol/L (ref 3.5–5.1)
Potassium: 2.9 mmol/L — ABNORMAL LOW (ref 3.5–5.1)
Sodium: 139 mmol/L (ref 135–145)
Sodium: 142 mmol/L (ref 135–145)

## 2021-06-05 LAB — BPAM FFP
Blood Product Expiration Date: 202206152359
Blood Product Expiration Date: 202206192359
Blood Product Expiration Date: 202206192359
Blood Product Expiration Date: 202206192359
ISSUE DATE / TIME: 202206151826
ISSUE DATE / TIME: 202206151826
ISSUE DATE / TIME: 202206151826
ISSUE DATE / TIME: 202206151826
Unit Type and Rh: 6200
Unit Type and Rh: 6200
Unit Type and Rh: 6200
Unit Type and Rh: 6200

## 2021-06-05 LAB — HEPATIC FUNCTION PANEL
ALT: 290 U/L — ABNORMAL HIGH (ref 0–44)
AST: 889 U/L — ABNORMAL HIGH (ref 15–41)
Albumin: 2.2 g/dL — ABNORMAL LOW (ref 3.5–5.0)
Alkaline Phosphatase: 25 U/L — ABNORMAL LOW (ref 38–126)
Bilirubin, Direct: 0.4 mg/dL — ABNORMAL HIGH (ref 0.0–0.2)
Indirect Bilirubin: 0.3 mg/dL (ref 0.3–0.9)
Total Bilirubin: 0.7 mg/dL (ref 0.3–1.2)
Total Protein: 3.6 g/dL — ABNORMAL LOW (ref 6.5–8.1)

## 2021-06-05 LAB — HEMOGLOBIN AND HEMATOCRIT, BLOOD
HCT: 25.5 % — ABNORMAL LOW (ref 36.0–46.0)
Hemoglobin: 8.2 g/dL — ABNORMAL LOW (ref 12.0–15.0)

## 2021-06-05 LAB — CBC
HCT: 38.8 % (ref 36.0–46.0)
HCT: 37.2 % (ref 36.0–46.0)
Hemoglobin: 12.2 g/dL (ref 12.0–15.0)
Hemoglobin: 12.3 g/dL (ref 12.0–15.0)
MCH: 28.4 pg (ref 26.0–34.0)
MCH: 29.1 pg (ref 26.0–34.0)
MCHC: 31.4 g/dL (ref 30.0–36.0)
MCHC: 33.1 g/dL (ref 30.0–36.0)
MCV: 90.2 fL (ref 80.0–100.0)
MCV: 88.2 fL (ref 80.0–100.0)
Platelets: 232 10*3/uL (ref 150–400)
Platelets: 104 10*3/uL — ABNORMAL LOW (ref 150–400)
RBC: 4.3 MIL/uL (ref 3.87–5.11)
RBC: 4.22 MIL/uL (ref 3.87–5.11)
RDW: 15.9 % — ABNORMAL HIGH (ref 11.5–15.5)
RDW: 16.2 % — ABNORMAL HIGH (ref 11.5–15.5)
WBC: 8.2 10*3/uL (ref 4.0–10.5)
WBC: 11.3 10*3/uL — ABNORMAL HIGH (ref 4.0–10.5)
nRBC: 0 % (ref 0.0–0.2)
nRBC: 0 % (ref 0.0–0.2)

## 2021-06-05 LAB — LIPID PANEL
Cholesterol: 138 mg/dL (ref 0–200)
HDL: 40 mg/dL — ABNORMAL LOW (ref >40)
LDL Cholesterol: 82 mg/dL (ref 0–99)
Total CHOL/HDL Ratio: 3.5 RATIO
Triglycerides: 82 mg/dL (ref <150)
VLDL: 16 mg/dL (ref 0–40)

## 2021-06-05 LAB — TROPONIN I (HIGH SENSITIVITY): Troponin I (High Sensitivity): 1903 ng/L (ref <18)

## 2021-06-05 LAB — PLATELET COUNT: Platelets: 120 10*3/uL — ABNORMAL LOW (ref 150–400)

## 2021-06-05 LAB — PREPARE RBC (CROSSMATCH)

## 2021-06-05 LAB — APTT: aPTT: >200 seconds (ref 24–36)

## 2021-06-05 LAB — POCT I-STAT EG7
Acid-base deficit: 16 mmol/L — ABNORMAL HIGH (ref 0.0–2.0)
Bicarbonate: 13.6 mmol/L — ABNORMAL LOW (ref 20.0–28.0)
Calcium, Ion: 1.17 mmol/L (ref 1.15–1.40)
HCT: 21 % — ABNORMAL LOW (ref 36.0–46.0)
Hemoglobin: 7.1 g/dL — ABNORMAL LOW (ref 12.0–15.0)
O2 Saturation: 85 %
Potassium: 2.9 mmol/L — ABNORMAL LOW (ref 3.5–5.1)
Sodium: 136 mmol/L (ref 135–145)
TCO2: 15 mmol/L — ABNORMAL LOW (ref 22–32)
pCO2, Ven: 49 mmHg (ref 44.0–60.0)
pH, Ven: 7.051 — CL (ref 7.250–7.430)
pO2, Ven: 72 mmHg — ABNORMAL HIGH (ref 32.0–45.0)

## 2021-06-05 LAB — HEPARIN LEVEL (UNFRACTIONATED)
Heparin Unfractionated: 0.48 IU/mL (ref 0.30–0.70)
Heparin Unfractionated: 0.95 IU/mL — ABNORMAL HIGH (ref 0.30–0.70)
Heparin Unfractionated: 0.97 IU/mL — ABNORMAL HIGH (ref 0.30–0.70)

## 2021-06-05 LAB — TSH: TSH: 3.981 u[IU]/mL (ref 0.350–4.500)

## 2021-06-05 LAB — GLUCOSE, CAPILLARY
Glucose-Capillary: 92 mg/dL (ref 70–99)
Glucose-Capillary: 91 mg/dL (ref 70–99)
Glucose-Capillary: 84 mg/dL (ref 70–99)
Glucose-Capillary: 192 mg/dL — ABNORMAL HIGH (ref 70–99)
Glucose-Capillary: 161 mg/dL — ABNORMAL HIGH (ref 70–99)
Glucose-Capillary: 177 mg/dL — ABNORMAL HIGH (ref 70–99)

## 2021-06-05 LAB — PROTIME-INR
INR: 1.6 — ABNORMAL HIGH (ref 0.8–1.2)
Prothrombin Time: 19.2 seconds — ABNORMAL HIGH (ref 11.4–15.2)

## 2021-06-05 LAB — LACTIC ACID, PLASMA: Lactic Acid, Venous: 7.5 mmol/L (ref 0.5–1.9)

## 2021-06-05 LAB — LACTATE DEHYDROGENASE: LDH: 2015 U/L — ABNORMAL HIGH (ref 98–192)

## 2021-06-05 LAB — FIBRINOGEN: Fibrinogen: 193 mg/dL — ABNORMAL LOW (ref 210–475)

## 2021-06-05 LAB — MAGNESIUM
Magnesium: 1.9 mg/dL (ref 1.7–2.4)
Magnesium: 1.8 mg/dL (ref 1.7–2.4)

## 2021-06-05 SURGERY — CORONARY ARTERY BYPASS GRAFTING (CABG)
Anesthesia: General | Site: Chest

## 2021-06-05 SURGERY — LEFT HEART CATH AND CORONARY ANGIOGRAPHY
Anesthesia: LOCAL

## 2021-06-05 MED ORDER — FENTANYL CITRATE (PF) 100 MCG/2ML IJ SOLN
INTRAMUSCULAR | Status: DC | PRN
  Administered 2021-06-05: 100 ug via INTRAVENOUS

## 2021-06-05 MED ORDER — FENTANYL CITRATE (PF) 100 MCG/2ML IJ SOLN
INTRAMUSCULAR | Status: AC
  Filled 2021-06-05: qty 2

## 2021-06-05 MED ORDER — TRANEXAMIC ACID (OHS) PUMP PRIME SOLUTION
2.0000 mg/kg | INTRAVENOUS | Status: DC
  Filled 2021-06-05 (×2): qty 1.79

## 2021-06-05 MED ORDER — HEPARIN (PORCINE) 25000 UT/250ML-% IV SOLN
500.0000 [IU]/h | INTRAVENOUS | Status: DC
  Administered 2021-06-05: 500 [IU]/h via INTRAVENOUS
  Filled 2021-06-05: qty 250

## 2021-06-05 MED ORDER — EPINEPHRINE HCL 5 MG/250ML IV SOLN IN NS
0.0000 ug/min | INTRAVENOUS | Status: AC
  Administered 2021-06-05: 2 ug/min via INTRAVENOUS
  Filled 2021-06-05 (×2): qty 250

## 2021-06-05 MED ORDER — NITROGLYCERIN 2 % TD OINT
1.0000 [in_us] | TOPICAL_OINTMENT | Freq: Four times a day (QID) | TRANSDERMAL | Status: DC
  Administered 2021-06-05: 1 [in_us] via TOPICAL
  Filled 2021-06-05: qty 1

## 2021-06-05 MED ORDER — HEPARIN (PORCINE) IN NACL 1000-0.9 UT/500ML-% IV SOLN
INTRAVENOUS | Status: AC
  Filled 2021-06-05: qty 1000

## 2021-06-05 MED ORDER — SODIUM CHLORIDE 0.45 % IV SOLN: INTRAVENOUS | Status: DC | PRN

## 2021-06-05 MED ORDER — NITROGLYCERIN IN D5W 200-5 MCG/ML-% IV SOLN
2.0000 ug/min | INTRAVENOUS | Status: DC
  Filled 2021-06-05: qty 250

## 2021-06-05 MED ORDER — SODIUM CHLORIDE 0.9 % IV SOLN: INTRAVENOUS | Status: DC | PRN

## 2021-06-05 MED ORDER — LACTATED RINGERS IV SOLN
INTRAVENOUS | Status: DC
  Administered 2021-06-12: 1000 mL via INTRAVENOUS

## 2021-06-05 MED ORDER — SODIUM CHLORIDE 0.9 % IV SOLN: INTRAVENOUS | Status: DC

## 2021-06-05 MED ORDER — TEMAZEPAM 15 MG PO CAPS: 15.0000 mg | ORAL_CAPSULE | Freq: Once | ORAL | Status: DC | PRN

## 2021-06-05 MED ORDER — ACETAMINOPHEN 160 MG/5ML PO SOLN: 650.0000 mg | Freq: Once | ORAL | Status: AC

## 2021-06-05 MED ORDER — MIDAZOLAM HCL 2 MG/2ML IJ SOLN
2.0000 mg | INTRAMUSCULAR | Status: DC | PRN
  Administered 2021-06-06 – 2021-06-15 (×21): 2 mg via INTRAVENOUS
  Filled 2021-06-05 (×11): qty 2

## 2021-06-05 MED ORDER — MILRINONE LACTATE IN DEXTROSE 20-5 MG/100ML-% IV SOLN
INTRAVENOUS | Status: DC | PRN
  Administered 2021-06-05: .25 ug/kg/min via INTRAVENOUS

## 2021-06-05 MED ORDER — NOREPINEPHRINE 4 MG/250ML-% IV SOLN
0.0000 ug/min | INTRAVENOUS | Status: AC
  Administered 2021-06-05: 2 ug/min via INTRAVENOUS
  Filled 2021-06-05: qty 250

## 2021-06-05 MED ORDER — SODIUM CHLORIDE 0.9% FLUSH
3.0000 mL | Freq: Two times a day (BID) | INTRAVENOUS | Status: DC
  Administered 2021-06-06 – 2021-06-14 (×15): 3 mL via INTRAVENOUS

## 2021-06-05 MED ORDER — ACETAMINOPHEN 650 MG RE SUPP
650.0000 mg | Freq: Once | RECTAL | Status: AC
  Administered 2021-06-05: 650 mg via RECTAL

## 2021-06-05 MED ORDER — LIDOCAINE HCL (PF) 1 % IJ SOLN
INTRAMUSCULAR | Status: DC | PRN
  Administered 2021-06-05: 2 mL
  Administered 2021-06-05: 10 mL

## 2021-06-05 MED ORDER — VERAPAMIL HCL 2.5 MG/ML IV SOLN
INTRAVENOUS | Status: AC
  Filled 2021-06-05: qty 2

## 2021-06-05 MED ORDER — PHENYLEPHRINE 40 MCG/ML (10ML) SYRINGE FOR IV PUSH (FOR BLOOD PRESSURE SUPPORT)
PREFILLED_SYRINGE | INTRAVENOUS | Status: AC
  Filled 2021-06-05: qty 10

## 2021-06-05 MED ORDER — NITROGLYCERIN 0.2 MG/ML ON CALL CATH LAB
INTRAVENOUS | Status: DC | PRN
  Administered 2021-06-05: 200 ug via INTRAVENOUS
  Administered 2021-06-05 (×2): 100 ug via INTRAVENOUS

## 2021-06-05 MED ORDER — VERAPAMIL HCL 2.5 MG/ML IV SOLN
INTRAVENOUS | Status: DC | PRN
  Administered 2021-06-05: 10 mL via INTRA_ARTERIAL

## 2021-06-05 MED ORDER — HEPARIN SODIUM (PORCINE) 5000 UNIT/ML IJ SOLN
INTRAVENOUS | Status: DC
  Filled 2021-06-05: qty 10

## 2021-06-05 MED ORDER — ONDANSETRON HCL 4 MG/2ML IJ SOLN
INTRAMUSCULAR | Status: AC
  Filled 2021-06-05: qty 2

## 2021-06-05 MED ORDER — INSULIN REGULAR(HUMAN) IN NACL 100-0.9 UT/100ML-% IV SOLN
INTRAVENOUS | Status: DC
  Administered 2021-06-06: 3.4 [IU]/h via INTRAVENOUS
  Filled 2021-06-05: qty 100

## 2021-06-05 MED ORDER — MILRINONE LACTATE IN DEXTROSE 20-5 MG/100ML-% IV SOLN
0.3000 ug/kg/min | INTRAVENOUS | Status: DC
  Filled 2021-06-05 (×2): qty 100

## 2021-06-05 MED ORDER — ALBUMIN HUMAN 5 % IV SOLN
INTRAVENOUS | Status: AC
  Administered 2021-06-05: 12.5 g via INTRAVENOUS
  Filled 2021-06-05: qty 500

## 2021-06-05 MED ORDER — METOPROLOL TARTRATE 5 MG/5ML IV SOLN: 2.5000 mg | INTRAVENOUS | Status: DC | PRN

## 2021-06-05 MED ORDER — MAGNESIUM SULFATE 50 % IJ SOLN
40.0000 meq | INTRAMUSCULAR | Status: DC
  Filled 2021-06-05 (×3): qty 9.85

## 2021-06-05 MED ORDER — POTASSIUM CHLORIDE 10 MEQ/50ML IV SOLN
10.0000 meq | INTRAVENOUS | Status: AC
  Administered 2021-06-05 – 2021-06-06 (×6): 10 meq via INTRAVENOUS

## 2021-06-05 MED ORDER — SODIUM CHLORIDE 0.9% FLUSH
10.0000 mL | Freq: Two times a day (BID) | INTRAVENOUS | Status: DC
  Administered 2021-06-07 – 2021-06-10 (×7): 10 mL
  Administered 2021-06-10: 20 mL
  Administered 2021-06-11: 10 mL
  Administered 2021-06-12 – 2021-06-13 (×2): 20 mL
  Administered 2021-06-14: 10 mL
  Administered 2021-06-15: 40 mL
  Administered 2021-06-16: 20 mL
  Administered 2021-06-17: 10 mL

## 2021-06-05 MED ORDER — LACTATED RINGERS IV SOLN: INTRAVENOUS | Status: DC

## 2021-06-05 MED ORDER — NITROGLYCERIN IN D5W 200-5 MCG/ML-% IV SOLN
INTRAVENOUS | Status: DC | PRN
  Administered 2021-06-05: 50 ug/min via INTRAVENOUS

## 2021-06-05 MED ORDER — ACETAMINOPHEN 160 MG/5ML PO SOLN
1000.0000 mg | Freq: Four times a day (QID) | ORAL | Status: AC
  Administered 2021-06-06 – 2021-06-10 (×17): 1000 mg
  Filled 2021-06-05 (×19): qty 40.6

## 2021-06-05 MED ORDER — PANTOPRAZOLE SODIUM 40 MG IV SOLR: 40.0000 mg | Freq: Every day | INTRAVENOUS | Status: DC

## 2021-06-05 MED ORDER — EPINEPHRINE HCL 5 MG/250ML IV SOLN IN NS
0.0000 ug/min | INTRAVENOUS | Status: DC
  Filled 2021-06-05: qty 250

## 2021-06-05 MED ORDER — ASPIRIN 81 MG PO CHEW
CHEWABLE_TABLET | ORAL | Status: AC
  Filled 2021-06-05: qty 1

## 2021-06-05 MED ORDER — 0.9 % SODIUM CHLORIDE (POUR BTL) OPTIME
TOPICAL | Status: DC | PRN
  Administered 2021-06-05: 5000 mL

## 2021-06-05 MED ORDER — ORAL CARE MOUTH RINSE
15.0000 mL | OROMUCOSAL | Status: DC
  Administered 2021-06-06 – 2021-06-17 (×109): 15 mL via OROMUCOSAL

## 2021-06-05 MED ORDER — MAGNESIUM SULFATE 4 GM/100ML IV SOLN
4.0000 g | Freq: Once | INTRAVENOUS | Status: AC
  Administered 2021-06-05: 4 g via INTRAVENOUS
  Filled 2021-06-05: qty 100

## 2021-06-05 MED ORDER — VANCOMYCIN HCL 1500 MG/300ML IV SOLN
1500.0000 mg | INTRAVENOUS | Status: AC
  Administered 2021-06-05: 1500 mg via INTRAVENOUS
  Filled 2021-06-05 (×2): qty 300

## 2021-06-05 MED ORDER — CEFAZOLIN SODIUM-DEXTROSE 2-4 GM/100ML-% IV SOLN
2.0000 g | INTRAVENOUS | Status: DC
  Filled 2021-06-05 (×2): qty 100

## 2021-06-05 MED ORDER — MIDAZOLAM HCL 5 MG/5ML IJ SOLN
INTRAMUSCULAR | Status: DC | PRN
  Administered 2021-06-05: 2 mg via INTRAVENOUS
  Administered 2021-06-05: 4 mg via INTRAVENOUS
  Administered 2021-06-05 (×3): 2 mg via INTRAVENOUS

## 2021-06-05 MED ORDER — FENTANYL CITRATE (PF) 100 MCG/2ML IJ SOLN
INTRAMUSCULAR | Status: DC | PRN
  Administered 2021-06-05 (×4): 25 ug via INTRAVENOUS

## 2021-06-05 MED ORDER — POTASSIUM CHLORIDE 10 MEQ/50ML IV SOLN
10.0000 meq | INTRAVENOUS | Status: DC
  Filled 2021-06-05: qty 50

## 2021-06-05 MED ORDER — SODIUM CHLORIDE 0.9% FLUSH: 10.0000 mL | INTRAVENOUS | Status: DC | PRN

## 2021-06-05 MED ORDER — SUCCINYLCHOLINE CHLORIDE 200 MG/10ML IV SOSY
PREFILLED_SYRINGE | INTRAVENOUS | Status: DC | PRN
  Administered 2021-06-05: 140 mg via INTRAVENOUS

## 2021-06-05 MED ORDER — MIDAZOLAM HCL 2 MG/2ML IJ SOLN
INTRAMUSCULAR | Status: DC | PRN
  Administered 2021-06-05: 4 mg via INTRAVENOUS

## 2021-06-05 MED ORDER — SODIUM CHLORIDE 0.9 % IV SOLN: 250.0000 mL | INTRAVENOUS | Status: DC | PRN

## 2021-06-05 MED ORDER — EPINEPHRINE 1 MG/10ML IJ SOSY
PREFILLED_SYRINGE | INTRAMUSCULAR | Status: DC | PRN
  Administered 2021-06-05: 1 mg via INTRAVENOUS
  Administered 2021-06-05 (×4): 100 ug via INTRAVENOUS
  Administered 2021-06-05: 500 ug via INTRAVENOUS

## 2021-06-05 MED ORDER — PHENYLEPHRINE HCL-NACL 20-0.9 MG/250ML-% IV SOLN
30.0000 ug/min | INTRAVENOUS | Status: DC
  Filled 2021-06-05 (×2): qty 250

## 2021-06-05 MED ORDER — BISACODYL 5 MG PO TBEC
10.0000 mg | DELAYED_RELEASE_TABLET | Freq: Every day | ORAL | Status: DC
  Administered 2021-06-06: 10 mg via ORAL
  Filled 2021-06-05: qty 2

## 2021-06-05 MED ORDER — ROCURONIUM BROMIDE 10 MG/ML (PF) SYRINGE
PREFILLED_SYRINGE | INTRAVENOUS | Status: DC | PRN
  Administered 2021-06-05: 30 mg via INTRAVENOUS
  Administered 2021-06-05: 100 mg via INTRAVENOUS
  Administered 2021-06-05: 50 mg via INTRAVENOUS

## 2021-06-05 MED ORDER — DEXTROSE-NACL 5-0.45 % IV SOLN: INTRAVENOUS | Status: DC

## 2021-06-05 MED ORDER — TRANEXAMIC ACID (OHS) BOLUS VIA INFUSION
15.0000 mg/kg | INTRAVENOUS | Status: AC
  Administered 2021-06-05: 1342.5 mg via INTRAVENOUS
  Filled 2021-06-05 (×2): qty 1343

## 2021-06-05 MED ORDER — SODIUM CHLORIDE 0.9 % IV SOLN
0.0000 mg/h | INTRAVENOUS | Status: DC
  Administered 2021-06-05: 1 mg/h via INTRAVENOUS
  Administered 2021-06-06: 4 mg/h via INTRAVENOUS
  Administered 2021-06-06: 8 mg/h via INTRAVENOUS
  Administered 2021-06-06: 4 mg/h via INTRAVENOUS
  Administered 2021-06-07 – 2021-06-09 (×11): 8 mg/h via INTRAVENOUS
  Filled 2021-06-05 (×17): qty 5

## 2021-06-05 MED ORDER — OXYCODONE HCL 5 MG PO TABS: 5.0000 mg | ORAL_TABLET | ORAL | Status: DC | PRN

## 2021-06-05 MED ORDER — PHENYLEPHRINE HCL-NACL 20-0.9 MG/250ML-% IV SOLN: 0.0000 ug/min | INTRAVENOUS | Status: DC

## 2021-06-05 MED ORDER — FENTANYL CITRATE (PF) 250 MCG/5ML IJ SOLN
INTRAMUSCULAR | Status: DC | PRN
  Administered 2021-06-05: 250 ug via INTRAVENOUS

## 2021-06-05 MED ORDER — ALBUMIN HUMAN 5 % IV SOLN: INTRAVENOUS | Status: DC | PRN

## 2021-06-05 MED ORDER — MILRINONE LACTATE IN DEXTROSE 20-5 MG/100ML-% IV SOLN
0.5000 ug/kg/min | INTRAVENOUS | Status: DC
  Administered 2021-06-06 – 2021-06-10 (×9): 0.25 ug/kg/min via INTRAVENOUS
  Administered 2021-06-11 – 2021-06-13 (×5): 0.375 ug/kg/min via INTRAVENOUS
  Filled 2021-06-05 (×13): qty 100

## 2021-06-05 MED ORDER — SODIUM CHLORIDE 0.9% IV SOLUTION: INTRAVENOUS | Status: DC | PRN

## 2021-06-05 MED ORDER — CHLORHEXIDINE GLUCONATE 0.12% ORAL RINSE (MEDLINE KIT)
15.0000 mL | Freq: Two times a day (BID) | OROMUCOSAL | Status: DC
  Administered 2021-06-06 – 2021-06-17 (×23): 15 mL via OROMUCOSAL

## 2021-06-05 MED ORDER — MIDAZOLAM HCL 2 MG/2ML IJ SOLN
INTRAMUSCULAR | Status: AC
  Filled 2021-06-05: qty 2

## 2021-06-05 MED ORDER — FAMOTIDINE IN NACL 20-0.9 MG/50ML-% IV SOLN
20.0000 mg | Freq: Two times a day (BID) | INTRAVENOUS | Status: AC
  Administered 2021-06-05 – 2021-06-06 (×2): 20 mg via INTRAVENOUS
  Filled 2021-06-05 (×2): qty 50

## 2021-06-05 MED ORDER — HEPARIN SODIUM (PORCINE) 1000 UNIT/ML IJ SOLN
INTRAMUSCULAR | Status: AC
  Filled 2021-06-05: qty 1

## 2021-06-05 MED ORDER — AMIODARONE HCL IN DEXTROSE 360-4.14 MG/200ML-% IV SOLN
INTRAVENOUS | Status: DC | PRN
  Administered 2021-06-05: 60 mg/h via INTRAVENOUS

## 2021-06-05 MED ORDER — INSULIN REGULAR(HUMAN) IN NACL 100-0.9 UT/100ML-% IV SOLN: INTRAVENOUS | Status: DC

## 2021-06-05 MED ORDER — SODIUM CHLORIDE 0.9 % IV SOLN: 10.0000 mL/h | Freq: Once | INTRAVENOUS | Status: DC

## 2021-06-05 MED ORDER — FENTANYL 2500MCG IN NS 250ML (10MCG/ML) PREMIX INFUSION
INTRAVENOUS | Status: AC
  Filled 2021-06-05: qty 250

## 2021-06-05 MED ORDER — AMIODARONE HCL IN DEXTROSE 360-4.14 MG/200ML-% IV SOLN
60.0000 mg/h | INTRAVENOUS | Status: DC
  Administered 2021-06-06 – 2021-06-11 (×10): 30 mg/h via INTRAVENOUS
  Administered 2021-06-11: 60 mg/h via INTRAVENOUS
  Administered 2021-06-11: 30 mg/h via INTRAVENOUS
  Administered 2021-06-11 – 2021-06-17 (×22): 60 mg/h via INTRAVENOUS
  Filled 2021-06-05 (×33): qty 200

## 2021-06-05 MED ORDER — SODIUM CHLORIDE 0.9 % IV SOLN
INTRAVENOUS | Status: AC | PRN
  Administered 2021-06-05: 50 mL/h via INTRAVENOUS

## 2021-06-05 MED ORDER — PLASMA-LYTE A IV SOLN
INTRAVENOUS | Status: DC
  Filled 2021-06-05 (×2): qty 5

## 2021-06-05 MED ORDER — DEXTROSE 50 % IV SOLN: 0.0000 mL | INTRAVENOUS | Status: DC | PRN

## 2021-06-05 MED ORDER — ALBUMIN HUMAN 5 % IV SOLN
250.0000 mL | INTRAVENOUS | Status: AC | PRN
  Administered 2021-06-06 (×4): 12.5 g via INTRAVENOUS
  Filled 2021-06-05 (×2): qty 250

## 2021-06-05 MED ORDER — LACTATED RINGERS IV SOLN: INTRAVENOUS | Status: DC | PRN

## 2021-06-05 MED ORDER — SODIUM CHLORIDE 0.9 % IV SOLN
INTRAVENOUS | Status: DC
  Filled 2021-06-05 (×2): qty 30

## 2021-06-05 MED ORDER — NOREPINEPHRINE 4 MG/250ML-% IV SOLN: 0.0000 ug/min | INTRAVENOUS | Status: DC

## 2021-06-05 MED ORDER — MIDAZOLAM HCL (PF) 10 MG/2ML IJ SOLN
INTRAMUSCULAR | Status: AC
  Filled 2021-06-05: qty 2

## 2021-06-05 MED ORDER — METOPROLOL TARTRATE 12.5 MG HALF TABLET: 12.5000 mg | ORAL_TABLET | Freq: Once | ORAL | Status: DC

## 2021-06-05 MED ORDER — ASPIRIN EC 325 MG PO TBEC: 325.0000 mg | DELAYED_RELEASE_TABLET | Freq: Every day | ORAL | Status: DC

## 2021-06-05 MED ORDER — ACETAMINOPHEN 500 MG PO TABS: 1000.0000 mg | ORAL_TABLET | Freq: Four times a day (QID) | ORAL | Status: DC

## 2021-06-05 MED ORDER — HEPARIN (PORCINE) IN NACL 1000-0.9 UT/500ML-% IV SOLN
INTRAVENOUS | Status: DC | PRN
  Administered 2021-06-05 (×2): 500 mL

## 2021-06-05 MED ORDER — CHLORHEXIDINE GLUCONATE 0.12 % MT SOLN: 15.0000 mL | Freq: Once | OROMUCOSAL | Status: DC

## 2021-06-05 MED ORDER — METOPROLOL TARTRATE 12.5 MG HALF TABLET: 12.5000 mg | ORAL_TABLET | Freq: Two times a day (BID) | ORAL | Status: DC

## 2021-06-05 MED ORDER — SODIUM CHLORIDE 0.9 % WEIGHT BASED INFUSION
1.0000 mL/kg/h | INTRAVENOUS | Status: DC
  Administered 2021-06-05: 1 mL/kg/h via INTRAVENOUS

## 2021-06-05 MED ORDER — MIDAZOLAM HCL 2 MG/2ML IJ SOLN
INTRAMUSCULAR | Status: DC | PRN
  Administered 2021-06-05: 2 mg via INTRAVENOUS

## 2021-06-05 MED ORDER — FENTANYL CITRATE (PF) 250 MCG/5ML IJ SOLN
INTRAMUSCULAR | Status: AC
  Filled 2021-06-05: qty 5

## 2021-06-05 MED ORDER — METOPROLOL TARTRATE 25 MG/10 ML ORAL SUSPENSION: 12.5000 mg | Freq: Two times a day (BID) | ORAL | Status: DC

## 2021-06-05 MED ORDER — PANTOPRAZOLE SODIUM 40 MG PO TBEC: 40.0000 mg | DELAYED_RELEASE_TABLET | Freq: Every day | ORAL | Status: DC

## 2021-06-05 MED ORDER — ETOMIDATE 2 MG/ML IV SOLN
INTRAVENOUS | Status: DC | PRN
  Administered 2021-06-05: 16 mg via INTRAVENOUS

## 2021-06-05 MED ORDER — BISACODYL 5 MG PO TBEC
5.0000 mg | DELAYED_RELEASE_TABLET | Freq: Once | ORAL | Status: DC
  Filled 2021-06-05: qty 1

## 2021-06-05 MED ORDER — ONDANSETRON HCL 4 MG/2ML IJ SOLN: 4.0000 mg | Freq: Four times a day (QID) | INTRAMUSCULAR | Status: DC | PRN

## 2021-06-05 MED ORDER — CHLORHEXIDINE GLUCONATE CLOTH 2 % EX PADS
6.0000 | MEDICATED_PAD | Freq: Every day | CUTANEOUS | Status: DC
  Administered 2021-06-05 – 2021-06-17 (×13): 6 via TOPICAL

## 2021-06-05 MED ORDER — PROTAMINE SULFATE 10 MG/ML IV SOLN
INTRAVENOUS | Status: DC | PRN
  Administered 2021-06-05: 150 mg via INTRAVENOUS

## 2021-06-05 MED ORDER — POTASSIUM CHLORIDE 2 MEQ/ML IV SOLN
80.0000 meq | INTRAVENOUS | Status: DC
  Filled 2021-06-05: qty 40

## 2021-06-05 MED ORDER — DOCUSATE SODIUM 100 MG PO CAPS: 200.0000 mg | ORAL_CAPSULE | Freq: Every day | ORAL | Status: DC

## 2021-06-05 MED ORDER — CEFAZOLIN SODIUM-DEXTROSE 2-4 GM/100ML-% IV SOLN
2.0000 g | Freq: Three times a day (TID) | INTRAVENOUS | Status: DC
  Administered 2021-06-05 – 2021-06-07 (×5): 2 g via INTRAVENOUS
  Filled 2021-06-05 (×8): qty 100

## 2021-06-05 MED ORDER — CEFAZOLIN SODIUM-DEXTROSE 2-4 GM/100ML-% IV SOLN
2.0000 g | INTRAVENOUS | Status: AC
  Administered 2021-06-05 (×2): 2 g via INTRAVENOUS
  Filled 2021-06-05 (×2): qty 100

## 2021-06-05 MED ORDER — PLASMA-LYTE A IV SOLN
INTRAVENOUS | Status: DC | PRN
  Administered 2021-06-05: 1000 mL

## 2021-06-05 MED ORDER — CALCIUM CHLORIDE 10 % IV SOLN
INTRAVENOUS | Status: DC | PRN
  Administered 2021-06-05 (×2): .5 g via INTRAVENOUS

## 2021-06-05 MED ORDER — SODIUM CHLORIDE 0.9% IV SOLUTION: INTRAVENOUS | Status: DC

## 2021-06-05 MED ORDER — BISACODYL 10 MG RE SUPP
10.0000 mg | Freq: Every day | RECTAL | Status: DC
  Administered 2021-06-08 – 2021-06-09 (×2): 10 mg via RECTAL
  Filled 2021-06-05 (×2): qty 1

## 2021-06-05 MED ORDER — ASPIRIN 81 MG PO CHEW: 81.0000 mg | CHEWABLE_TABLET | ORAL | Status: DC

## 2021-06-05 MED ORDER — SODIUM CHLORIDE 0.9% FLUSH: 3.0000 mL | INTRAVENOUS | Status: DC | PRN

## 2021-06-05 MED ORDER — ASPIRIN EC 81 MG PO TBEC: 81.0000 mg | DELAYED_RELEASE_TABLET | Freq: Every day | ORAL | Status: DC

## 2021-06-05 MED ORDER — PROPOFOL 10 MG/ML IV BOLUS
INTRAVENOUS | Status: AC
  Filled 2021-06-05: qty 20

## 2021-06-05 MED ORDER — LACTATED RINGERS IV SOLN: 500.0000 mL | Freq: Once | INTRAVENOUS | Status: DC | PRN

## 2021-06-05 MED ORDER — IOHEXOL 350 MG/ML SOLN
INTRAVENOUS | Status: DC | PRN
  Administered 2021-06-05: 30 mL

## 2021-06-05 MED ORDER — SODIUM CHLORIDE 0.9 % WEIGHT BASED INFUSION
3.0000 mL/kg/h | INTRAVENOUS | Status: DC
  Administered 2021-06-05: 3 mL/kg/h via INTRAVENOUS

## 2021-06-05 MED ORDER — CHLORHEXIDINE GLUCONATE 0.12 % MT SOLN
15.0000 mL | OROMUCOSAL | Status: AC
  Administered 2021-06-05: 15 mL via OROMUCOSAL

## 2021-06-05 MED ORDER — LIDOCAINE HCL (PF) 1 % IJ SOLN
INTRAMUSCULAR | Status: AC
  Filled 2021-06-05: qty 30

## 2021-06-05 MED ORDER — INSULIN REGULAR(HUMAN) IN NACL 100-0.9 UT/100ML-% IV SOLN
INTRAVENOUS | Status: AC
  Administered 2021-06-05: 5 [IU]/h via INTRAVENOUS
  Filled 2021-06-05 (×2): qty 100

## 2021-06-05 MED ORDER — AMIODARONE IV BOLUS ONLY 150 MG/100ML
INTRAVENOUS | Status: DC | PRN
  Administered 2021-06-05: 300 mg via INTRAVENOUS

## 2021-06-05 MED ORDER — HEPARIN SODIUM (PORCINE) 1000 UNIT/ML IJ SOLN
INTRAMUSCULAR | Status: DC | PRN
  Administered 2021-06-05: 30000 [IU] via INTRAVENOUS

## 2021-06-05 MED ORDER — TRAMADOL HCL 50 MG PO TABS: 50.0000 mg | ORAL_TABLET | ORAL | Status: DC | PRN

## 2021-06-05 MED ORDER — VANCOMYCIN HCL IN DEXTROSE 1-5 GM/200ML-% IV SOLN
1000.0000 mg | Freq: Once | INTRAVENOUS | Status: AC
Start: 2021-06-06 — End: 2021-06-06
  Administered 2021-06-06: 1000 mg via INTRAVENOUS
  Filled 2021-06-05 (×2): qty 200

## 2021-06-05 MED ORDER — HYDROMORPHONE BOLUS VIA INFUSION
1.0000 mg | INTRAVENOUS | Status: DC | PRN
  Administered 2021-06-06: 1 mg via INTRAVENOUS
  Administered 2021-06-09: 2 mg via INTRAVENOUS
  Administered 2021-06-09 – 2021-06-14 (×9): 1 mg via INTRAVENOUS
  Filled 2021-06-05: qty 1

## 2021-06-05 MED ORDER — SODIUM CHLORIDE 0.9% FLUSH
3.0000 mL | Freq: Two times a day (BID) | INTRAVENOUS | Status: DC
  Administered 2021-06-05: 3 mL via INTRAVENOUS

## 2021-06-05 MED ORDER — ASPIRIN 81 MG PO CHEW
324.0000 mg | CHEWABLE_TABLET | Freq: Every day | ORAL | Status: DC
  Administered 2021-06-06 – 2021-06-17 (×10): 324 mg
  Filled 2021-06-05 (×11): qty 4

## 2021-06-05 MED ORDER — VASOPRESSIN 20 UNIT/ML IV SOLN
INTRAVENOUS | Status: DC | PRN
  Administered 2021-06-05: 4 [IU] via INTRAVENOUS
  Administered 2021-06-05: 5 [IU] via INTRAVENOUS
  Administered 2021-06-05: 2 [IU] via INTRAVENOUS

## 2021-06-05 MED ORDER — DEXMEDETOMIDINE HCL IN NACL 400 MCG/100ML IV SOLN
0.4000 ug/kg/h | INTRAVENOUS | Status: DC
  Administered 2021-06-05 – 2021-06-09 (×14): 0.7 ug/kg/h via INTRAVENOUS
  Administered 2021-06-09: 0.5 ug/kg/h via INTRAVENOUS
  Administered 2021-06-09: 0.7 ug/kg/h via INTRAVENOUS
  Administered 2021-06-10: 0.4 ug/kg/h via INTRAVENOUS
  Administered 2021-06-11: 0.5 ug/kg/h via INTRAVENOUS
  Administered 2021-06-11: 0.4 ug/kg/h via INTRAVENOUS
  Administered 2021-06-11 – 2021-06-13 (×3): 0.5 ug/kg/h via INTRAVENOUS
  Administered 2021-06-13 – 2021-06-14 (×3): 0.4 ug/kg/h via INTRAVENOUS
  Filled 2021-06-05 (×16): qty 100
  Filled 2021-06-05: qty 200
  Filled 2021-06-05 (×3): qty 100
  Filled 2021-06-05: qty 200
  Filled 2021-06-05 (×6): qty 100

## 2021-06-05 MED ORDER — HEPARIN SODIUM (PORCINE) 1000 UNIT/ML IJ SOLN
INTRAMUSCULAR | Status: DC | PRN
  Administered 2021-06-05: 4500 [IU] via INTRAVENOUS

## 2021-06-05 MED ORDER — NITROGLYCERIN IN D5W 200-5 MCG/ML-% IV SOLN: 0.0000 ug/min | INTRAVENOUS | Status: DC

## 2021-06-05 MED ORDER — SODIUM CHLORIDE 0.9 % IV SOLN: 250.0000 mL | INTRAVENOUS | Status: DC

## 2021-06-05 MED ORDER — VASOPRESSIN 20 UNIT/ML IV SOLN
INTRAVENOUS | Status: AC
  Filled 2021-06-05: qty 1

## 2021-06-05 MED ORDER — AMIODARONE HCL IN DEXTROSE 360-4.14 MG/200ML-% IV SOLN
INTRAVENOUS | Status: AC
  Administered 2021-06-05: 30 mg/h via INTRAVENOUS
  Filled 2021-06-05: qty 200

## 2021-06-05 MED ORDER — DEXMEDETOMIDINE HCL IN NACL 400 MCG/100ML IV SOLN
0.1000 ug/kg/h | INTRAVENOUS | Status: AC
  Administered 2021-06-05: .5 ug/kg/h via INTRAVENOUS
  Filled 2021-06-05 (×2): qty 100

## 2021-06-05 MED ORDER — TRANEXAMIC ACID 1000 MG/10ML IV SOLN
1.5000 mg/kg/h | INTRAVENOUS | Status: DC
  Filled 2021-06-05 (×2): qty 25

## 2021-06-05 MED ORDER — MORPHINE SULFATE (PF) 2 MG/ML IV SOLN: 1.0000 mg | INTRAVENOUS | Status: DC | PRN

## 2021-06-05 SURGICAL SUPPLY — 95 items
ANCHOR CATH FOLEY SECURE (MISCELLANEOUS) ×6 IMPLANT
BAG DECANTER FOR FLEXI CONT (MISCELLANEOUS) ×4 IMPLANT
BANDAGE ESMARK 6X9 LF (GAUZE/BANDAGES/DRESSINGS) IMPLANT
BLADE CLIPPER SURG (BLADE) ×4 IMPLANT
BLADE STERNUM SYSTEM 6 (BLADE) ×4 IMPLANT
BNDG CMPR 9X6 STRL LF SNTH (GAUZE/BANDAGES/DRESSINGS) ×2
BNDG CMPR MED 15X6 ELC VLCR LF (GAUZE/BANDAGES/DRESSINGS) ×2
BNDG ELASTIC 4X5.8 VLCR STR LF (GAUZE/BANDAGES/DRESSINGS) ×4 IMPLANT
BNDG ELASTIC 6X15 VLCR STRL LF (GAUZE/BANDAGES/DRESSINGS) ×4 IMPLANT
BNDG ELASTIC 6X5.8 VLCR STR LF (GAUZE/BANDAGES/DRESSINGS) ×4 IMPLANT
BNDG ESMARK 6X9 LF (GAUZE/BANDAGES/DRESSINGS) ×4
BNDG GAUZE ELAST 4 BULKY (GAUZE/BANDAGES/DRESSINGS) ×4 IMPLANT
CANISTER SUCT 3000ML PPV (MISCELLANEOUS) ×4 IMPLANT
CANNULA EZ GLIDE AORTIC 21FR (CANNULA) ×4 IMPLANT
CANNULA FEM BIOMEDICUS 25FR (CANNULA) ×3 IMPLANT
CATH CPB KIT HENDRICKSON (MISCELLANEOUS) ×4 IMPLANT
CATH ROBINSON RED A/P 18FR (CATHETERS) ×4 IMPLANT
CATH THORACIC 36FR (CATHETERS) ×4 IMPLANT
CATH THORACIC 36FR RT ANG (CATHETERS) ×4 IMPLANT
CLIP VESOCCLUDE MED 24/CT (CLIP) IMPLANT
CLIP VESOCCLUDE SM WIDE 24/CT (CLIP) ×2 IMPLANT
CONN 3/8X3/8 GISH STERILE (MISCELLANEOUS) ×2 IMPLANT
CONTAINER PROTECT SURGISLUSH (MISCELLANEOUS) ×4 IMPLANT
DRAPE CARDIOVASCULAR INCISE (DRAPES) ×4
DRAPE SRG 135X102X78XABS (DRAPES) ×2 IMPLANT
DRAPE WARM FLUID 44X44 (DRAPES) ×4 IMPLANT
DRSG COVADERM 4X14 (GAUZE/BANDAGES/DRESSINGS) ×4 IMPLANT
ELECT REM PT RETURN 9FT ADLT (ELECTROSURGICAL) ×8
ELECTRODE REM PT RTRN 9FT ADLT (ELECTROSURGICAL) ×4 IMPLANT
FELT TEFLON 1X6 (MISCELLANEOUS) ×8 IMPLANT
GAUZE SPONGE 4X4 12PLY STRL (GAUZE/BANDAGES/DRESSINGS) ×8 IMPLANT
GLOVE SURG SIGNA 7.5 PF LTX (GLOVE) ×12 IMPLANT
GOWN STRL REUS W/ TWL LRG LVL3 (GOWN DISPOSABLE) ×8 IMPLANT
GOWN STRL REUS W/ TWL XL LVL3 (GOWN DISPOSABLE) ×4 IMPLANT
GOWN STRL REUS W/TWL LRG LVL3 (GOWN DISPOSABLE) ×16
GOWN STRL REUS W/TWL XL LVL3 (GOWN DISPOSABLE) ×8
HEMOSTAT POWDER SURGIFOAM 1G (HEMOSTASIS) ×12 IMPLANT
HEMOSTAT SURGICEL 2X14 (HEMOSTASIS) ×8 IMPLANT
INSERT FOGARTY XLG (MISCELLANEOUS) IMPLANT
KIT BASIN OR (CUSTOM PROCEDURE TRAY) ×4 IMPLANT
KIT SUCTION CATH 14FR (SUCTIONS) ×8 IMPLANT
KIT TOURNIQUET VASCULAR (KITS) ×2 IMPLANT
KIT TURNOVER KIT B (KITS) ×4 IMPLANT
KIT VASOVIEW HEMOPRO 2 VH 4000 (KITS) ×4 IMPLANT
MARKER GRAFT CORONARY BYPASS (MISCELLANEOUS) ×12 IMPLANT
NS IRRIG 1000ML POUR BTL (IV SOLUTION) ×20 IMPLANT
PACK E OPEN HEART (SUTURE) ×4 IMPLANT
PACK OPEN HEART (CUSTOM PROCEDURE TRAY) ×4 IMPLANT
PAD ARMBOARD 7.5X6 YLW CONV (MISCELLANEOUS) ×8 IMPLANT
PAD ELECT DEFIB RADIOL ZOLL (MISCELLANEOUS) ×4 IMPLANT
PENCIL BUTTON HOLSTER BLD 10FT (ELECTRODE) ×4 IMPLANT
POSITIONER HEAD DONUT 9IN (MISCELLANEOUS) ×4 IMPLANT
POWDER SURGICEL 3.0 GRAM (HEMOSTASIS) ×3 IMPLANT
PUNCH AORTIC ROTATE 4.0MM (MISCELLANEOUS) IMPLANT
PUNCH AORTIC ROTATE 4.5MM 8IN (MISCELLANEOUS) ×2 IMPLANT
PUNCH AORTIC ROTATE 5MM 8IN (MISCELLANEOUS) IMPLANT
SET MPS 3-ND DEL (MISCELLANEOUS) ×4 IMPLANT
SET TUBING HLS ADVANCED 7.0 (TUBING) ×2 IMPLANT
SUPPORT HEART JANKE-BARRON (MISCELLANEOUS) ×4 IMPLANT
SUT BONE WAX W31G (SUTURE) ×4 IMPLANT
SUT ETHIBOND 2 0 SH (SUTURE) ×4
SUT ETHIBOND 2 0 SH 36X2 (SUTURE) IMPLANT
SUT MNCRL AB 4-0 PS2 18 (SUTURE) IMPLANT
SUT PROLENE 3 0 SH 48 (SUTURE) ×6 IMPLANT
SUT PROLENE 3 0 SH DA (SUTURE) ×4 IMPLANT
SUT PROLENE 4 0 RB 1 (SUTURE) ×4
SUT PROLENE 4 0 SH DA (SUTURE) IMPLANT
SUT PROLENE 4-0 RB1 .5 CRCL 36 (SUTURE) ×1 IMPLANT
SUT PROLENE 6 0 C 1 30 (SUTURE) ×17 IMPLANT
SUT PROLENE 7 0 BV1 MDA (SUTURE) ×4 IMPLANT
SUT PROLENE 8 0 BV175 6 (SUTURE) ×16 IMPLANT
SUT SILK  1 MH (SUTURE) ×4
SUT SILK 1 MH (SUTURE) ×1 IMPLANT
SUT STEEL 6MS V (SUTURE) ×4 IMPLANT
SUT STEEL STERNAL CCS#1 18IN (SUTURE) IMPLANT
SUT STEEL SZ 6 DBL 3X14 BALL (SUTURE) ×4 IMPLANT
SUT VIC AB 1 CTX 36 (SUTURE) ×8
SUT VIC AB 1 CTX36XBRD ANBCTR (SUTURE) ×4 IMPLANT
SUT VIC AB 2-0 CT1 27 (SUTURE)
SUT VIC AB 2-0 CT1 TAPERPNT 27 (SUTURE) IMPLANT
SUT VIC AB 2-0 CTX 27 (SUTURE) IMPLANT
SUT VIC AB 3-0 SH 27 (SUTURE)
SUT VIC AB 3-0 SH 27X BRD (SUTURE) IMPLANT
SUT VIC AB 3-0 X1 27 (SUTURE) IMPLANT
SUT VICRYL 4-0 PS2 18IN ABS (SUTURE) IMPLANT
SYSTEM SAHARA CHEST DRAIN ATS (WOUND CARE) ×4 IMPLANT
TAPE CLOTH SURG 4X10 WHT LF (GAUZE/BANDAGES/DRESSINGS) ×2 IMPLANT
TAPE PAPER 2X10 WHT MICROPORE (GAUZE/BANDAGES/DRESSINGS) ×3 IMPLANT
TOWEL GREEN STERILE (TOWEL DISPOSABLE) ×4 IMPLANT
TOWEL GREEN STERILE FF (TOWEL DISPOSABLE) ×4 IMPLANT
TRAY CATH LUMEN 1 20CM STRL (SET/KITS/TRAYS/PACK) ×2 IMPLANT
TRAY FOLEY SLVR 16FR TEMP STAT (SET/KITS/TRAYS/PACK) ×4 IMPLANT
TUBING LAP HI FLOW INSUFFLATIO (TUBING) ×4 IMPLANT
UNDERPAD 30X36 HEAVY ABSORB (UNDERPADS AND DIAPERS) ×4 IMPLANT
WATER STERILE IRR 1000ML POUR (IV SOLUTION) ×8 IMPLANT

## 2021-06-05 SURGICAL SUPPLY — 13 items
CATH 5FR JL3.5 JR4 ANG PIG MP (CATHETERS) ×1 IMPLANT
DEVICE RAD COMP TR BAND LRG (VASCULAR PRODUCTS) ×1 IMPLANT
GLIDESHEATH SLEND SS 6F .021 (SHEATH) ×1 IMPLANT
GUIDEWIRE INQWIRE 1.5J.035X260 (WIRE) IMPLANT
INQWIRE 1.5J .035X260CM (WIRE) ×2
KIT HEART LEFT (KITS) ×2 IMPLANT
PACK CARDIAC CATHETERIZATION (CUSTOM PROCEDURE TRAY) ×2 IMPLANT
SET IMPELLA CP PUMP (CATHETERS) ×1 IMPLANT
SHEATH PINNACLE 6F 10CM (SHEATH) ×1 IMPLANT
TRANSDUCER W/STOPCOCK (MISCELLANEOUS) ×2 IMPLANT
TUBING ART PRESS 72  MALE/FEM (TUBING) ×2
TUBING ART PRESS 72 MALE/FEM (TUBING) IMPLANT
TUBING CIL FLEX 10 FLL-RA (TUBING) ×2 IMPLANT

## 2021-06-05 NOTE — Progress Notes (Addendum)
ANTICOAGULATION CONSULT NOTE - Initial Consult  Pharmacy Consult for IV Heparin Indication:  ECMO and Impella  No Known Allergies  Patient Measurements: Height: 5\' 3"  (160 cm) Weight: 89.5 kg (197 lb 5 oz) IBW/kg (Calculated) : 52.4 Heparin Dosing Weight: 72 kg  Vital Signs: BP: 110/84 (06/15 1412) Pulse Rate: 98 (06/15 2024)  Labs: Recent Labs    05/29/2021 1003 05/29/2021 1550 05/29/2021 2033 06/03/2021 2234 05/23/2021 0006 06/11/2021 0450 06/09/2021 0851 05/29/2021 1506 06/18/2021 1630 06/06/2021 1644 05/22/2021 1712 06/06/2021 1738 05/31/2021 1823 06/11/2021 1845 05/31/2021 1907 06/02/2021 1927  HGB 13.2  --   --   --   --  12.2  --    < > 8.2* 8.2*   < > 8.5*   < > 8.5* 7.5* 9.2*  HCT 42.2  --   --   --   --  38.8  --    < > 25.5* 24.0*   < > 25.0*   < > 25.0* 22.0* 27.0*  PLT 230  --   --   --   --  232  --   --  120*  --   --   --   --   --   --   --   HEPARINUNFRC  --   --   --   --  0.97*  --  0.95*  --   --   --   --   --   --   --   --   --   CREATININE 1.67*  --   --   --   --  1.50*  --    < >  --  1.50*  --  1.40*  --  1.40*  --   --   TROPONINIHS 44*   < > 1,070* 1,455* 1,903*  --   --   --   --   --   --   --   --   --   --   --    < > = values in this interval not displayed.    Estimated Creatinine Clearance: 45.3 mL/min (A) (by C-G formula based on SCr of 1.4 mg/dL (H)).   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 Cristopher Peru)   Thyroid disease    PROCEDURE FOR THYROID 15 YRS AGO AT [facility]   WPW (Wolff-Parkinson-White syndrome)     Medications:  Infusions:   sodium chloride     sodium chloride     [START ON 06/06/2021] sodium chloride     sodium chloride     sodium chloride     sodium chloride     sodium chloride     sodium chloride      albumin human     albumin human     amiodarone     amiodarone      ceFAZolin (ANCEF) IV     dexmedetomidine (PRECEDEX) IV infusion 0.7 mcg/kg/hr (06/03/2021 2038)   dextrose 5 % and 0.45% NaCl     epinephrine     famotidine (PEPCID) IV     heparin 50 units/mL (Impella PURGE) in dextrose 5 % 1000 mL bag     heparin     HYDROmorphone  insulin     insulin     lactated ringers     lactated ringers     lactated ringers     magnesium sulfate     milrinone     nitroGLYCERIN     norepinephrine (LEVOPHED) Adult infusion     phenylephrine (NEO-SYNEPHRINE) Adult infusion     potassium chloride     [START ON 06/06/2021] vancomycin      Assessment: 60 years of age female s/p CABG x2 on VA ECMO with Impella to start IV heparin per pharmacy dosing protocol.   ACT low at 140 -starting Impella purge at 50 units/mL.  Prior Heparin levels were elevated on rate of 800 units/hr prior to procedures.  Impella currently running at 1.1 mL/hr (50 units/mL) providing 55 units/hr. Will initiated IV Heparin in conjunction for total of ~7.6 units/kg/hr.  Paltets 120 >>104. Hgb 12.3 after 2 units PRBCs in OR.   Goal of Therapy:  Heparin level 0.3 to 0.5 units/ml Monitor platelets by anticoagulation protocol: Yes   Plan:  Impella running at 1.1 mL/hr (50 units/mL) providing 55 units/hr.   Start Heparin drip at 500 units/hr for total of 555 units/hr.  *Starting low due prior elevated levels on rates in 600-800 units/hr range.  Heparin level and CBC in 6 hrs RN to call RX if Impella purge increases rapidly  Sloan Leiter, PharmD, BCPS, BCCCP Clinical Pharmacist Please refer to [facility] for [place] numbers 06/11/2021,8:51 PM

## 2021-06-05 NOTE — Progress Notes (Signed)
To 2h23 for removal of r ulnar sheath; pleth placed to r pinky to assess ulnar flow via reverse barbeu; waveform muted; not hematoma or ecchymosis noted; weal ulnar pulse; very small amount of heme withdrawn from sheath (ECMO); 6 fr sheath removed with sheath tip intact; 14 cc or air in TR band with hemostasis achieved; post pull waveform assessment with reverse barbeu- very muted waveform; weak unlar pulse; left in care of assigned RN to monitor.

## 2021-06-05 NOTE — Progress Notes (Signed)
  Echocardiogram Echocardiogram Transesophageal has been performed.  Fidel Levy 06/08/2021, 3:55 PM

## 2021-06-05 NOTE — Interval H&P Note (Signed)
Cath Lab Visit (complete for each Cath Lab visit)  Clinical Evaluation Leading to the Procedure:   ACS: Yes.    Non-ACS:    Anginal Classification: CCS IV  Anti-ischemic medical therapy: Minimal Therapy (1 class of medications)  Non-Invasive Test Results: No non-invasive testing performed  Prior CABG: No previous CABG      History and Physical Interval Note:  06/14/2021 1:08 PM  Almyra Free  has presented today for surgery, with the diagnosis of nstemi.  The various methods of treatment have been discussed with the patient and family. After consideration of risks, benefits and other options for treatment, the patient has consented to  Procedure(s): LEFT HEART CATH AND CORONARY ANGIOGRAPHY (N/A) as a surgical intervention.  The patient's history has been reviewed, patient examined, no change in status, stable for surgery.  I have reviewed the patient's chart and labs.  Questions were answered to the patient's satisfaction.     Larae Grooms

## 2021-06-05 NOTE — Progress Notes (Signed)
Pharmacy Electrolyte Replacement  Recent Labs:  Recent Labs    05/25/2021 0450 06/02/2021 1506 05/24/2021 1845 06/11/2021 1907 05/23/2021 1927  K 3.8   < > 3.8   < > 3.7  MG 1.9  --   --   --   --   CREATININE 1.50*   < > 1.40*  --   --    < > = values in this interval not displayed.    Low Critical Values (K </= 2.5, Phos </= 1, Mg </= 1) Present: None  MD Contacted: Dr. Sung Amabile  Plan: K 3.1 on I-stat - ordered KCl 10 mEq IV x6.   Sloan Leiter, PharmD, BCPS, BCCCP Clinical Pharmacist Please refer to [facility] for [place] numbers 05/29/2021, 9:41 PM

## 2021-06-05 NOTE — Brief Op Note (Signed)
06/11/2021 - 06/09/2021  8:31 PM  PATIENT:  [name] [name]  60 y.o. female  PRE-OPERATIVE DIAGNOSIS:  SCAD LEFT MAIN, CARDIOGENIC SHOCK  POST-OPERATIVE DIAGNOSIS:  SCAD LEFT MAIN, CARDIOGENIC SHOCK  PROCEDURE:  Procedure(s):  EMERGENT CORONARY ARTERY BYPASS GRAFTING x 2 -LIMA to LAD -SVG to OM  ENDOSCOPIC HARVEST GREATER SAPHENOUS VEIN -Right Thigh ( 30 min/10 min)  CANNULATION FOR ECMO  TRANSESOPHAGEAL ECHOCARDIOGRAM (TEE) (N/A)  SURGEON:  Surgeon(s) and Role:    * Melrose Nakayama, MD - Primary  PHYSICIAN ASSISTANT: Ellwood Handler PA-C  ASSISTANTS: Dineen Kid RNFA, DIANA ACUNA RNFA  ANESTHESIA:   general  EBL:  1200 mL   BLOOD ADMINISTERED: 2 units CC PRBC and  CC CELLSAVER  DRAINS:  Impella device from cath lab, left pleural chest tube, mediastinal chest drains    LOCAL MEDICATIONS USED:  NONE  SPECIMEN:  No Specimen  DISPOSITION OF SPECIMEN:  N/A  COUNTS:  YES  TOURNIQUET:  * No tourniquets in log *  DICTATION: .Dragon Dictation  PLAN OF CARE: Admit to inpatient   PATIENT DISPOSITION:  ICU - intubated and critically ill.   Delay start of Pharmacological VTE agent (>24hrs) due to surgical blood loss or risk of bleeding: yes

## 2021-06-05 NOTE — Anesthesia Procedure Notes (Signed)
Central Venous Catheter Insertion Performed by: Roberts Gaudy, MD, anesthesiologist Start/End06/07/2021 2:25 PM, 05/27/2021 2:35 AM Patient location: Pre-op. Preanesthetic checklist: patient identified, IV checked, site marked, risks and benefits discussed, surgical consent, monitors and equipment checked, pre-op evaluation, timeout performed and anesthesia consent Lidocaine 1% used for infiltration and patient sedated Hand hygiene performed  and maximum sterile barriers used  Catheter size: 9 Fr Sheath introducer Procedure performed using ultrasound guided technique. Ultrasound Notes:anatomy identified, needle tip was noted to be adjacent to the nerve/plexus identified, no ultrasound evidence of intravascular and/or intraneural injection and image(s) printed for medical record Attempts: 1 Following insertion, line sutured and dressing applied. Post procedure assessment: blood return through all ports, free fluid flow and no air  Patient tolerated the procedure well with no immediate complications.

## 2021-06-05 NOTE — H&P (Signed)
[name] [name] is an 60 y.o. female.   Chief Complaint: CP HPI: 60 yo woman with history noted below. Admitted with CP. Cath today showed left main SCAD, no distal flow on 2nd injection. C/o crushing CP and had ST elevation, hypotension and frequent ectopy. Dr. Irish Lack placed Impella  Past Medical History:  Diagnosis Date   Arthritis    Brain aneurysm    Diabetes mellitus without complication ([place])    Headache    Hypertension        SVT (supraventricular tachycardia) (HCC)    s/p radiofrequency catheter ablation for AVNRT 02/16/09 (Dr. Cristopher Peru)   Thyroid disease    PROCEDURE FOR THYROID 15 YRS AGO AT [facility]   WPW (Wolff-Parkinson-White syndrome)     Past Surgical History:  Procedure Laterality Date   BREAST SURGERY     BX RT BREAST  BENIGN   CRANIOTOMY N/A 01/04/2016   Procedure: Suboccipital Craniotomy and Cervical one Laminectomy for Clipping of Aneurysm;  Surgeon: Kevan Ny Ditty, MD;  Location: [facility];  Service: Neurosurgery;  Laterality: N/A;   CRANIOTOMY N/A 07/09/2016   Procedure: Craniotomy for clipping of left middle cerebral artery aneurysm;  Surgeon: Kevan Ny Ditty, MD;  Location: [facility];  Service: Neurosurgery;  Laterality: N/A;  Craniotomy for 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 Christella Noa;  Surgeon: Kevan Ny Ditty, MD;  Location: [facility];  Service: Neurosurgery;  Laterality: Right;   ELECTROPHYSIOLOGIC STUDY     LAPAROSCOPIC REVISION VENTRICULAR-PERITONEAL (V-P) SHUNT N/A 02/04/2016   Procedure: LAPAROSCOPIC Insertion VENTRICULAR-PERITONEAL (V-P) SHUNT;  Surgeon: Rolm Bookbinder, MD;  Location: [facility];  Service: General;  Laterality: N/A;   RADIOLOGY WITH ANESTHESIA N/A 01/03/2016   Procedure: RADIOLOGY WITH ANESTHESIA;  Surgeon: Medication Radiologist, MD;  Location: [facility];  Service: Radiology;   Laterality: N/A;   VENTRICULOPERITONEAL SHUNT Right 02/04/2016   Procedure: SHUNT INSERTION VENTRICULAR-PERITONEAL With Laparoscopic Assistance;  Surgeon: Kevan Ny Ditty, MD;  Location: [facility];  Service: Neurosurgery;  Laterality: Right;    Family History  Problem Relation Age of Onset   Hypertension Mother    Prostate cancer Father    Cancer Sister    Colon cancer Neg Hx    Colon polyps Neg Hx    Social History:  reports that she has been smoking cigarettes. She has been smoking an average of 0.50 packs per day. She has never used smokeless tobacco. She reports that she does not drink alcohol and does not use drugs.  Allergies: No Known Allergies  Medications Prior to Admission  Medication Sig Dispense Refill   acetaminophen (TYLENOL) 500 MG tablet Take 1,000 mg by mouth daily as needed for moderate pain or headache.     amLODipine (NORVASC) 5 MG tablet Take 5 mg by mouth every evening.      JARDIANCE 10 MG TABS tablet Take 10 mg by mouth daily.     levothyroxine (SYNTHROID) 88 MCG tablet Take 88 mcg by mouth daily.     potassium chloride (KLOR-CON) 10 MEQ tablet Take 10 mEq by mouth daily.     rosuvastatin (CRESTOR) 40 MG tablet Take 40 mg by mouth daily.     valsartan-hydrochlorothiazide (DIOVAN-HCT) 160-25 MG tablet Take 1 tablet by mouth daily.     Vitamin D, Ergocalciferol, (DRISDOL) 1.25 MG (50000 UNIT) CAPS capsule Take 1 capsule by mouth once a week.  cyclobenzaprine (FLEXERIL) 10 MG tablet TAKE 1 TABLET BY MOUTH TWICE DAILY AS NEEDED FOR MUSCLE SPASMS (Patient not taking: No sig reported) 40 tablet 0   cyclobenzaprine (FLEXERIL) 10 MG tablet TAKE 1 TABLET BY MOUTH TWICE DAILY AS NEEDED FOR MUSCLE SPASMS (Patient not taking: No sig reported) 40 tablet 0   cyclobenzaprine (FLEXERIL) 10 MG tablet TAKE 1 TABLET BY MOUTH TWICE DAILY AS NEEDED FOR MUSCLE SPASMS (Patient not taking: No sig reported) 40 tablet 0   diazepam (VALIUM) 5 MG tablet Take 1 tablet (5 mg total)  by mouth 2 (two) times daily. (Patient not taking: No sig reported) 10 tablet 0   gabapentin (NEURONTIN) 100 MG capsule TAKE 1 CAPSULE BY MOUTH THREE TIMES DAILY (Patient not taking: No sig reported) 90 capsule 2   HYDROcodone-acetaminophen (NORCO/VICODIN) 5-325 MG tablet Take 1 tablet by mouth every 4 (four) hours as needed. (Patient not taking: No sig reported) 10 tablet 0   naproxen (NAPROSYN) 500 MG tablet TAKE 1 TABLET BY MOUTH TWICE DAILY WITH A MEAL (Patient not taking: No sig reported) 60 tablet 2   VOLTAREN 1 % GEL Apply 2 g topically 4 (four) times daily as needed (pain).  (Patient not taking: Reported on 05/28/2021)  5    Results for orders placed or performed during the hospital encounter of 05/31/2021 (from the past 48 hour(s))  Basic metabolic panel     Status: Abnormal   Collection Time: 06/02/2021 10:03 AM  Result Value Ref Range   Sodium 140 135 - 145 mmol/L   Potassium 3.9 3.5 - 5.1 mmol/L   Chloride 108 98 - 111 mmol/L   CO2 25 22 - 32 mmol/L   Glucose, Bld 94 70 - 99 mg/dL    Comment: Glucose reference range applies only to samples taken after fasting for at least 8 hours.   BUN 16 6 - 20 mg/dL   Creatinine, Ser 1.67 (H) 0.44 - 1.00 mg/dL   Calcium 9.0 8.9 - 10.3 mg/dL   GFR, Estimated 35 (L) >60 mL/min    Comment: (NOTE) Calculated using the CKD-EPI Creatinine Equation (2021)    Anion gap 7 5 - 15    Comment: Performed at [facility], [address] [place], [place] [address]  CBC     Status: Abnormal   Collection Time: 05/22/2021 10:03 AM  Result Value Ref Range   WBC 7.0 4.0 - 10.5 K/uL   RBC 4.62 3.87 - 5.11 MIL/uL   Hemoglobin 13.2 12.0 - 15.0 g/dL   HCT 42.2 36.0 - 46.0 %   MCV 91.3 80.0 - 100.0 fL   MCH 28.6 26.0 - 34.0 pg   MCHC 31.3 30.0 - 36.0 g/dL   RDW 16.3 (H) 11.5 - 15.5 %   Platelets 230 150 - 400 K/uL   nRBC 0.0 0.0 - 0.2 %    Comment: Performed at [facility], [address], [place], [place] [address]  Troponin I (High Sensitivity)     Status:  Abnormal   Collection Time: 06/06/2021 10:03 AM  Result Value Ref Range   Troponin I (High Sensitivity) 44 (H) <18 ng/L    Comment: (NOTE) Elevated high sensitivity troponin I (hsTnI) values and significant  changes across serial measurements may suggest ACS but many other  chronic and acute conditions are known to elevate hsTnI results.  Refer to the "Links" section for chest pain algorithms and additional  guidance. Performed at [facility], [address] [place], [place] [address]   Resp  Panel by RT-PCR (Flu A&B, Covid) Nasopharyngeal Swab     Status: None   Collection Time: 06/03/2021 11:56 AM   Specimen: Nasopharyngeal Swab; Nasopharyngeal(NP) swabs in vial transport medium  Result Value Ref Range   SARS Coronavirus 2 by RT PCR NEGATIVE NEGATIVE    Comment: (NOTE) SARS-CoV-2 target nucleic acids are NOT DETECTED.  The SARS-CoV-2 RNA is generally detectable in upper respiratory specimens during the acute phase of infection. The lowest concentration of SARS-CoV-2 viral copies this assay can detect is 138 copies/mL. A negative result does not preclude SARS-Cov-2 infection and should not be used as the sole basis for treatment or other patient management decisions. A negative result may occur with  improper specimen collection/handling, submission of specimen other than nasopharyngeal swab, presence of viral mutation(s) within the areas targeted by this assay, and inadequate number of viral copies(<138 copies/mL). A negative result must be combined with clinical observations, patient history, and epidemiological information. The expected result is Negative.  Fact Sheet for Patients:  EntrepreneurPulse.com.au  Fact Sheet for Healthcare Providers:  IncredibleEmployment.be  This test is no t yet approved or cleared by the [place] FDA and  has been authorized for detection and/or diagnosis of SARS-CoV-2 by FDA under an Emergency Use  Authorization (EUA). This EUA will remain  in effect (meaning this test can be used) for the duration of the COVID-19 declaration under Section 564(b)(1) of the Act, 21 U.S.C.section 360bbb-3(b)(1), unless the authorization is terminated  or revoked sooner.       Influenza A by PCR NEGATIVE NEGATIVE   Influenza B by PCR NEGATIVE NEGATIVE    Comment: (NOTE) The Xpert Xpress SARS-CoV-2/FLU/RSV plus assay is intended as an aid in the diagnosis of influenza from Nasopharyngeal swab specimens and should not be used as a sole basis for treatment. Nasal washings and aspirates are unacceptable for Xpert Xpress SARS-CoV-2/FLU/RSV testing.  Fact Sheet for Patients: EntrepreneurPulse.com.au  Fact Sheet for Healthcare Providers: IncredibleEmployment.be  This test is not yet approved or cleared by the [place] FDA and has been authorized for detection and/or diagnosis of SARS-CoV-2 by FDA under an Emergency Use Authorization (EUA). This EUA will remain in effect (meaning this test can be used) for the duration of the COVID-19 declaration under Section 564(b)(1) of the Act, 21 U.S.C. section 360bbb-3(b)(1), unless the authorization is terminated or revoked.  Performed at [facility], [address], [place], [place] [address]   Troponin I (High Sensitivity)     Status: Abnormal   Collection Time: 06/15/2021  3:50 PM  Result Value Ref Range   Troponin I (High Sensitivity) 206 (HH) <18 ng/L    Comment: DELTA CHECK NOTED CRITICAL RESULT CALLED TO, READ BACK BY AND VERIFIED WITH: HARRIS,M ON 06/09/2021 AT 1645 BY LOY,C (NOTE) Elevated high sensitivity troponin I (hsTnI) values and significant  changes across serial measurements may suggest ACS but many other  chronic and acute conditions are known to elevate hsTnI results.  Refer to the Links section for chest pain algorithms and additional  guidance. Performed at [facility], [address],  [place], [place] [address]   HIV Antibody (routine testing w rflx)     Status: None   Collection Time: 05/31/2021  4:16 PM  Result Value Ref Range   HIV Screen 4th Generation wRfx Non Reactive Non Reactive    Comment: Performed at [facility], [place] [address], [place], [place] [address]  Glucose, capillary     Status: Abnormal   Collection  Time: 06/06/2021  4:38 PM  Result Value Ref Range   Glucose-Capillary 102 (H) 70 - 99 mg/dL    Comment: Glucose reference range applies only to samples taken after fasting for at least 8 hours.   Comment 1 Notify RN    Comment 2 Document in Chart   Troponin I (High Sensitivity)     Status: Abnormal   Collection Time: 06/03/2021  5:35 PM  Result Value Ref Range   Troponin I (High Sensitivity) 372 (HH) <18 ng/L    Comment: DELTA CHECK NOTED CRITICAL RESULT CALLED TO, READ BACK BY AND VERIFIED WITH: ROBERTSON,B ON 06/06/2021 AT 1855 BY LOY,C (NOTE) Elevated high sensitivity troponin I (hsTnI) values and significant  changes across serial measurements may suggest ACS but many other  chronic and acute conditions are known to elevate hsTnI results.  Refer to the Links section for chest pain algorithms and additional  guidance. Performed at [facility], [address], [place], [place] [address]   Troponin I (High Sensitivity)     Status: Abnormal   Collection Time: 06/02/2021  8:33 PM  Result Value Ref Range   Troponin I (High Sensitivity) 1,070 (HH) <18 ng/L    Comment: CRITICAL RESULT CALLED TO, READ BACK BY AND VERIFIED WITH: MOODY,N ON 06/09/2021 AT 2130 BY LOY,C DELTA CHECK NOTED (NOTE) Elevated high sensitivity troponin I (hsTnI) values and significant  changes across serial measurements may suggest ACS but many other  chronic and acute conditions are known to elevate hsTnI results.  Refer to the Links section for chest pain algorithms and additional  guidance. Performed at [facility], [address], [place], [place] [address]   Glucose, capillary      Status: Abnormal   Collection Time: 06/14/2021  8:53 PM  Result Value Ref Range   Glucose-Capillary 122 (H) 70 - 99 mg/dL    Comment: Glucose reference range applies only to samples taken after fasting for at least 8 hours.  Troponin I (High Sensitivity)     Status: Abnormal   Collection Time: 05/29/2021 10:34 PM  Result Value Ref Range   Troponin I (High Sensitivity) 1,455 (HH) <18 ng/L    Comment: DELTA CHECK NOTED CRITICAL RESULT CALLED TO, READ BACK BY AND VERIFIED WITH: N MOODY,RN@2316  05/24/2021 MKELLY (NOTE) Elevated high sensitivity troponin I (hsTnI) values and significant  changes across serial measurements may suggest ACS but many other  chronic and acute conditions are known to elevate hsTnI results.  Refer to the Links section for chest pain algorithms and additional  guidance. Performed at [facility], [address], [place], [place] [address]   Heparin level (unfractionated)     Status: Abnormal   Collection Time: 05/24/2021 12:06 AM  Result Value Ref Range   Heparin Unfractionated 0.97 (H) 0.30 - 0.70 IU/mL    Comment: (NOTE) The clinical reportable range upper limit is being lowered to >1.10 to align with the FDA approved guidance for the current laboratory assay.  If heparin results are below expected values, and patient dosage has  been confirmed, suggest follow up testing of antithrombin III levels. Performed at [facility], [address], [place], [place] [address]   Troponin I (High Sensitivity)     Status: Abnormal   Collection Time: 06/06/2021 12:06 AM  Result Value Ref Range   Troponin I (High Sensitivity) 1,903 (HH) <18 ng/L    Comment: DELTA CHECK NOTED CRITICAL RESULT CALLED TO, READ BACK BY AND VERIFIED WITH: N MOODY,RN@0115  05/23/2021 MKELLY (NOTE) Elevated high  sensitivity troponin I (hsTnI) values and significant  changes across serial measurements may suggest ACS but many other  chronic and acute conditions are known to elevate hsTnI results.   Refer to the Links section for chest pain algorithms and additional  guidance. Performed at [facility], [address] [place], [place] [address]   TSH     Status: None   Collection Time: 05/31/2021  4:50 AM  Result Value Ref Range   TSH 3.981 0.350 - 4.500 uIU/mL    Comment: Performed by a 3rd Generation assay with a functional sensitivity of <=0.01 uIU/mL. Performed at [facility], [address] [place], [place] [address]   Magnesium     Status: None   Collection Time: 06/11/2021  4:50 AM  Result Value Ref Range   Magnesium 1.9 1.7 - 2.4 mg/dL    Comment: Performed at [facility], [address] [place], [place] [address]  Basic metabolic panel     Status: Abnormal   Collection Time: 06/03/2021  4:50 AM  Result Value Ref Range   Sodium 139 135 - 145 mmol/L   Potassium 3.8 3.5 - 5.1 mmol/L   Chloride 110 98 - 111 mmol/L   CO2 23 22 - 32 mmol/L   Glucose, Bld 81 70 - 99 mg/dL    Comment: Glucose reference range applies only to samples taken after fasting for at least 8 hours.   BUN 16 6 - 20 mg/dL   Creatinine, Ser 1.50 (H) 0.44 - 1.00 mg/dL   Calcium 8.9 8.9 - 10.3 mg/dL   GFR, Estimated 40 (L) >60 mL/min    Comment: (NOTE) Calculated using the CKD-EPI Creatinine Equation (2021)    Anion gap 6 5 - 15    Comment: Performed at [facility], [address], [place], [place] [address]  CBC     Status: Abnormal   Collection Time: 05/22/2021  4:50 AM  Result Value Ref Range   WBC 8.2 4.0 - 10.5 K/uL   RBC 4.30 3.87 - 5.11 MIL/uL   Hemoglobin 12.2 12.0 - 15.0 g/dL   HCT 38.8 36.0 - 46.0 %   MCV 90.2 80.0 - 100.0 fL   MCH 28.4 26.0 - 34.0 pg   MCHC 31.4 30.0 - 36.0 g/dL   RDW 15.9 (H) 11.5 - 15.5 %   Platelets 232 150 - 400 K/uL   nRBC 0.0 0.0 - 0.2 %    Comment: Performed at [facility], [address] [place], [place] [address]  Lipid panel     Status: Abnormal   Collection Time: 05/26/2021  4:50 AM  Result Value Ref Range   Cholesterol 138 0 - 200 mg/dL   Triglycerides 82  <150 mg/dL   HDL 40 (L) >40 mg/dL   Total CHOL/HDL Ratio 3.5 RATIO   VLDL 16 0 - 40 mg/dL   LDL Cholesterol 82 0 - 99 mg/dL    Comment:        Total Cholesterol/HDL:CHD Risk Coronary Heart Disease Risk Table                     Men   Women  1/2 Average Risk   3.4   3.3  Average Risk       5.0   4.4  2 X Average Risk   9.6   7.1  3 X Average Risk  23.4   11.0        Use the calculated Patient Ratio above and the CHD Risk  Table to determine the patient's CHD Risk.        ATP III CLASSIFICATION (LDL):  <100     mg/dL   Optimal  100-129  mg/dL   Near or Above                    Optimal  130-159  mg/dL   Borderline  160-189  mg/dL   High  >190     mg/dL   Very High Performed at [facility], [address], [place], [place] [address]   Glucose, capillary     Status: None   Collection Time: 06/03/2021  7:34 AM  Result Value Ref Range   Glucose-Capillary 92 70 - 99 mg/dL    Comment: Glucose reference range applies only to samples taken after fasting for at least 8 hours.  Heparin level (unfractionated)     Status: Abnormal   Collection Time: 06/01/2021  8:51 AM  Result Value Ref Range   Heparin Unfractionated 0.95 (H) 0.30 - 0.70 IU/mL    Comment: (NOTE) The clinical reportable range upper limit is being lowered to >1.10 to align with the FDA approved guidance for the current laboratory assay.  If heparin results are below expected values, and patient dosage has  been confirmed, suggest follow up testing of antithrombin III levels. Performed at [facility], [address] [place], [place] [address]   Glucose, capillary     Status: None   Collection Time: 05/23/2021 11:05 AM  Result Value Ref Range   Glucose-Capillary 91 70 - 99 mg/dL    Comment: Glucose reference range applies only to samples taken after fasting for at least 8 hours.  Glucose, capillary     Status: None   Collection Time: 06/06/2021 12:59 PM  Result Value Ref Range   Glucose-Capillary 84 70 - 99 mg/dL     Comment: Glucose reference range applies only to samples taken after fasting for at least 8 hours.   DG Chest 1 View  Result Date: 06/20/2021 CLINICAL DATA:  Chest pain since early this morning, hypertension, smoker EXAM: CHEST  1 VIEW COMPARISON:  Portable exam 0938 hours compared to 07/21/2017 FINDINGS: VP shunt tubing traverses RIGHT hemithorax. Borderline enlargement of cardiac silhouette. Mediastinal contours and pulmonary vascularity normal. Minimal subsegmental atelectasis RIGHT upper lobe. Lungs otherwise clear. No pulmonary infiltrate, pleural effusion, or pneumothorax. Bones demineralized. IMPRESSION: Minimal subsegmental atelectasis RIGHT upper lobe. Electronically Signed   By: Lavonia Dana M.D.   On: 05/28/2021 09:52   ECHOCARDIOGRAM COMPLETE  Result Date: 06/15/2021    ECHOCARDIOGRAM REPORT   Patient Name:   [name] [name] Date of Exam: 06/16/2021 Medical Rec #:  [id number]       Height:       63.0 in Accession #:    [id number]      Weight:       192.0 lb Date of Birth:  03/12/1961        BSA:          1.900 m Patient Age:    37 years        BP:           107/85 mmHg Patient Gender: F               HR:           47 bpm. Exam Location:  [facility] Procedure: 2D Echo, Cardiac Doppler and Color Doppler Indications:    Chest Pain R07.9  History:  Patient has prior history of Echocardiogram examinations, most                 recent 01/14/2016. Stroke, Arrythmias:Atrial Fibrillation and                 Bradycardia; Risk Factors:Hypertension and Current Smoker. WPW                 (Wolff-Parkinson-White syndrome) (From Hx).  Sonographer:    Alvino Chapel RCS Referring Phys: [id number] [place] D [place]  1. Left ventricular ejection fraction, by estimation, is 60 to 65%. The left ventricle has normal function. The left ventricle has no regional wall motion abnormalities. There is mild left ventricular hypertrophy. Left ventricular diastolic parameters are indeterminate. Elevated left atrial  pressure.  2. Right ventricular systolic function is normal. The right ventricular size is normal. There is mildly elevated pulmonary artery systolic pressure.  3. The mitral valve is normal in structure. Mild mitral valve regurgitation. No evidence of mitral stenosis.  4. The aortic valve is tricuspid. There is mild calcification of the aortic valve. There is mild thickening of the aortic valve. Aortic valve regurgitation is not visualized. No aortic stenosis is present.  5. Mild pulmonary HTN, PASP is 36 mmHg.  6. The inferior vena cava is normal in size with greater than 50% respiratory variability, suggesting right atrial pressure of 3 mmHg. FINDINGS  Left Ventricle: Left ventricular ejection fraction, by estimation, is 60 to 65%. The left ventricle has normal function. The left ventricle has no regional wall motion abnormalities. The left ventricular internal cavity size was normal in size. There is  mild left ventricular hypertrophy. Left ventricular diastolic parameters are indeterminate. Elevated left atrial pressure. Right Ventricle: The right ventricular size is normal. Right vetricular wall thickness was not assessed. Right ventricular systolic function is normal. There is mildly elevated pulmonary artery systolic pressure. The tricuspid regurgitant velocity is 2.86 m/s, and with an assumed right atrial pressure of 8 mmHg, the estimated right ventricular systolic pressure is 56.4 mmHg. Left Atrium: Left atrial size was normal in size. Right Atrium: Right atrial size was normal in size. Pericardium: There is no evidence of pericardial effusion. Mitral Valve: The mitral valve is normal in structure. There is mild thickening of the mitral valve leaflet(s). There is mild calcification of the mitral valve leaflet(s). Mild mitral annular calcification. Mild mitral valve regurgitation. No evidence of  mitral valve stenosis. Tricuspid Valve: The tricuspid valve is normal in structure. Tricuspid valve  regurgitation is mild . No evidence of tricuspid stenosis. Aortic Valve: The aortic valve is tricuspid. There is mild calcification of the aortic valve. There is mild thickening of the aortic valve. There is mild aortic valve annular calcification. Aortic valve regurgitation is not visualized. No aortic stenosis  is present. Pulmonic Valve: The pulmonic valve was not well visualized. Pulmonic valve regurgitation is not visualized. No evidence of pulmonic stenosis. Aorta: The aortic root is normal in size and structure. Pulmonary Artery: Mild pulmonary HTN, PASP is 36 mmHg. Venous: The inferior vena cava is normal in size with greater than 50% respiratory variability, suggesting right atrial pressure of 3 mmHg. IAS/Shunts: No atrial level shunt detected by color flow Doppler.  LEFT VENTRICLE PLAX 2D LVIDd:         4.40 cm  Diastology LVIDs:         2.50 cm  LV e' medial:    6.53 cm/s LV PW:         1.00  cm  LV E/e' medial:  15.9 LV IVS:        1.20 cm  LV e' lateral:   6.31 cm/s LVOT diam:     1.80 cm  LV E/e' lateral: 16.5 LV SV:         61 LV SV Index:   32 LVOT Area:     2.54 cm  RIGHT VENTRICLE RV S prime:     11.60 cm/s TAPSE (M-mode): 2.2 cm LEFT ATRIUM             Index       RIGHT ATRIUM           Index LA diam:        4.30 cm 2.26 cm/m  RA Area:     15.90 cm LA Vol (A2C):   41.4 ml 21.79 ml/m RA Volume:   43.05 ml  22.65 ml/m LA Vol (A4C):   46.9 ml 24.68 ml/m LA Biplane Vol: 44.2 ml 23.26 ml/m  AORTIC VALVE LVOT Vmax:   108.00 cm/s LVOT Vmean:  66.100 cm/s LVOT VTI:    0.239 m  AORTA Ao Root diam: 2.90 cm MITRAL VALVE                TRICUSPID VALVE MV Area (PHT): 3.53 cm     TR Peak grad:   32.7 mmHg MV Decel Time: 215 msec     TR Vmax:        286.00 cm/s MV E velocity: 104.00 cm/s MV A velocity: 82.50 cm/s   SHUNTS MV E/A ratio:  1.26         Systemic VTI:  0.24 m                             Systemic Diam: 1.80 cm Carlyle Dolly MD Electronically signed by Carlyle Dolly MD Signature Date/Time:  06/15/2021/6:15:59 PM    Final     Review of Systems  Blood pressure (!) 147/68, pulse (!) 55, temperature 98.3 F (36.8 C), temperature source Oral, resp. rate 18, height 5\' 3"  (1.6 m), weight 89.5 kg, SpO2 100 %. Physical Exam  BP 86/60, HR variable from 50s to 11s  Assessment/Plan 60 yo woman with SCAD of left main, having STEMI. Needs emergent CABG. High risk procedure with strong likelihood of mortality if dissection extends to distal vessels.  She gave verbal consent with multiple witnesses to proceed with emergency CABG, no religious objection to blood transfusion.  She was intubated for distress in cath lab and is currently in OR.  Melrose Nakayama, MD 06/11/2021, 2:21 PM

## 2021-06-05 NOTE — Transfer of Care (Signed)
Immediate Anesthesia Transfer of Care Note  Patient: [name] [name]  Procedure(s) Performed: CORONARY ARTERY BYPASS GRAFTING (CABG)X2.USING  LEFT INTERNAL MAMMARY ARTERY AND  RIGHT ENDOSCOPIC SAPHENOUS VEIN HARVESTING. ECMO INSERTION (Chest) TRANSESOPHAGEAL ECHOCARDIOGRAM (TEE)  Patient Location: ICU  Anesthesia Type:General  Level of Consciousness: sedated and Patient remains intubated per anesthesia plan  Airway & Oxygen Therapy: Patient remains intubated per anesthesia plan and Patient placed on Ventilator (see vital sign flow sheet for setting)  Post-op Assessment: Report given to RN and Post -op Vital signs reviewed and stable  Post vital signs: Reviewed and stable  Last Vitals:  Vitals Value Taken Time  BP    Temp    Pulse    Resp    SpO2      Last Pain:  Vitals:   05/25/2021 1359  TempSrc:   PainSc: 10-Worst pain ever      Patients Stated Pain Goal: 0 (44/51/46 0479)  Complications: No notable events documented.   Transported from OR to CVICU with full monitors.  Transported on vent with RRT assistance.  Echmo coordinator at bedside managing echmo for transport.  Impella coordinator managing impella device.  Hedrickson at bedside.  Patient stable at handoff. Marland Kitchen

## 2021-06-05 NOTE — Consult Note (Signed)
Advanced Heart Failure Team Consult Note   Primary Physician: Lucianne Lei, MD PCP-Cardiologist:  Dr. Johnsie Cancel   Reason for Consultation: Acute Cardiogenic Shock in Setting of SCAD   HPI:    [name] [name] is seen today for evaluation of acute cardiogenic shock, in the setting of SCAD, at the request of Dr. Irish Lack, Cardiology.   60 y/o AAF w/ H/o HTN and SVT s/p SVT ablation in 2010, admitted to [facility] for CP and ruled in for NSTEMI. Hs peaked 1,903. Echo w/ normal LVEF 60-65%. RV normal. Transferred to [facility] for LHC.   LHC showed LM SCAD w/ no distal flow on 2nd injection. Developed hypotension w/ frequent ectopy. Impella placed in Cath lab for support and she was intubated. CT surgery consulted and pt taken for emergent CABG. AHF team consulted to assist w/ management of cardiogenic shock.      Echo 3/14  1. Left ventricular ejection fraction, by estimation, is 60 to 65%. The left ventricle has normal function. The left ventricle has no regional wall motion abnormalities. There is mild left ventricular hypertrophy. Left ventricular diastolic parameters are indeterminate. Elevated left atrial pressure. 2. Right ventricular systolic function is normal. The right ventricular size is normal. There is mildly elevated pulmonary artery systolic pressure. 3. The mitral valve is normal in structure. Mild mitral valve regurgitation. No evidence of mitral stenosis. 4. The aortic valve is tricuspid. There is mild calcification of the aortic valve. There is mild thickening of the aortic valve. Aortic valve regurgitation is not visualized. No aortic stenosis is present. 5. Mild pulmonary HTN, PASP is 36 mmHg. 6. The inferior vena cava is normal in size with greater than 50% respiratory variability, suggesting right atrial pressure of 3 mmHg.  LHC 6/15   Mid LM to Dist LM lesion is 80% stenosed. Has the appearance of spontaneous coronary artery dissection Ost Cx to Prox Cx lesion is  80% stenosed. Has the appearance of extension from the left main dissection into the proximal circumflex. Prox LAD lesion is 100% stenosed. Occlusion resulting from extension of the left main dissection into the LAD. LV end diastolic pressure is normal. There is no aortic valve stenosis. Successful placement of Impella CP device in the right groin>>>OR for Emergent CABG   Review of Systems: unavailable as patient intubated sedated   Home Medications Prior to Admission medications   Medication Sig Start Date End Date Taking? Authorizing Provider  acetaminophen (TYLENOL) 500 MG tablet Take 1,000 mg by mouth daily as needed for moderate pain or headache.   Yes [provider]  amLODipine (NORVASC) 5 MG tablet Take 5 mg by mouth every evening.    Yes [provider]  JARDIANCE 10 MG TABS tablet Take 10 mg by mouth daily. 03/15/21  Yes [provider]  levothyroxine (SYNTHROID) 88 MCG tablet Take 88 mcg by mouth daily. 01/28/21  Yes [provider]  potassium chloride (KLOR-CON) 10 MEQ tablet Take 10 mEq by mouth daily. 04/24/21  Yes [provider]  rosuvastatin (CRESTOR) 40 MG tablet Take 40 mg by mouth daily. 01/28/21  Yes [provider]  valsartan-hydrochlorothiazide (DIOVAN-HCT) 160-25 MG tablet Take 1 tablet by mouth daily.   Yes [provider]  Vitamin D, Ergocalciferol, (DRISDOL) 1.25 MG (50000 UNIT) CAPS capsule Take 1 capsule by mouth once a week. 04/24/21  Yes [provider]  cyclobenzaprine (FLEXERIL) 10 MG tablet TAKE 1 TABLET BY MOUTH TWICE DAILY AS NEEDED FOR MUSCLE SPASMS Patient not  taking: No sig reported 06/28/18   Carole Civil, MD  cyclobenzaprine (FLEXERIL) 10 MG tablet TAKE 1 TABLET BY MOUTH TWICE DAILY AS NEEDED FOR MUSCLE SPASMS Patient not taking: No sig reported 11/04/18   Carole Civil, MD  cyclobenzaprine (FLEXERIL) 10 MG tablet TAKE 1 TABLET BY MOUTH TWICE DAILY AS NEEDED FOR MUSCLE  SPASMS Patient not taking: No sig reported 01/21/19   Carole Civil, MD  diazepam (VALIUM) 5 MG tablet Take 1 tablet (5 mg total) by mouth 2 (two) times daily. Patient not taking: No sig reported 03/14/18   Isla Pence, MD  gabapentin (NEURONTIN) 100 MG capsule TAKE 1 CAPSULE BY MOUTH THREE TIMES DAILY Patient not taking: No sig reported 04/21/19   Carole Civil, MD  HYDROcodone-acetaminophen (NORCO/VICODIN) 5-325 MG tablet Take 1 tablet by mouth every 4 (four) hours as needed. Patient not taking: No sig reported 03/14/18   Isla Pence, MD  naproxen (NAPROSYN) 500 MG tablet TAKE 1 TABLET BY MOUTH TWICE DAILY WITH A MEAL Patient not taking: No sig reported 06/29/19   Carole Civil, MD  VOLTAREN 1 % GEL Apply 2 g topically 4 (four) times daily as needed (pain).  Patient not taking: Reported on 05/28/2021 02/27/18   [provider]    Past Medical History: Past Medical History:  Diagnosis Date   Arthritis    Brain aneurysm    Diabetes mellitus without complication ([place])    Headache    Hypertension        SVT (supraventricular tachycardia) ([place])    s/p radiofrequency catheter ablation for AVNRT 02/16/09 (Dr. 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 Christella Noa;  Surgeon: Kevan Ny Ditty, MD;  Location: [place];  Service: Neurosurgery;  Laterality: Right;   ELECTROPHYSIOLOGIC STUDY     LAPAROSCOPIC REVISION VENTRICULAR-PERITONEAL (V-P) SHUNT N/A 02/04/2016   Procedure: LAPAROSCOPIC Insertion VENTRICULAR-PERITONEAL (V-P) SHUNT;  Surgeon: Rolm Bookbinder, MD;  Location: [place] [facility];  Service: General;  Laterality: N/A;   RADIOLOGY WITH ANESTHESIA N/A 01/03/2016   Procedure: RADIOLOGY WITH ANESTHESIA;  Surgeon: Medication Radiologist, MD;  Location: [place];  Service: Radiology;  Laterality: N/A;   VENTRICULOPERITONEAL SHUNT Right 02/04/2016   Procedure: SHUNT INSERTION VENTRICULAR-PERITONEAL With Laparoscopic Assistance;  Surgeon: Kevan Ny Ditty, MD;  Location: [place] [facility];  Service: Neurosurgery;  Laterality: Right;    Family History: Family History  Problem Relation Age of Onset   Hypertension Mother    Prostate cancer Father    Cancer Sister    Colon cancer Neg Hx    Colon polyps Neg Hx     Social History: Social History   Socioeconomic History   Marital status: Single  Spouse name: Not on file   Number of children: Not on file   Years of education: Not on file   Highest education level: Not on file  Occupational History   Not on file  Tobacco Use   Smoking status: Every Day    Packs/day: 0.50    Pack years: 0.00    Types: Cigarettes   Smokeless tobacco: Never  Vaping Use   Vaping Use: Never used  Substance and Sexual Activity   Alcohol use: No   Drug use: No   Sexual activity: Not on file  Other Topics Concern   Not on file  Social History Narrative   Not on file   Social Determinants of Health   Financial Resource Strain: Not on file  Food Insecurity: Not on file  Transportation Needs: Not on file  Physical Activity: Not on file  Stress: Not on file  Social Connections: Not on file    Allergies:  No Known  Allergies  Objective:    Vital Signs:   Temp:  [97.6 F (36.4 C)-98.8 F (37.1 C)] 98.3 F (36.8 C) (06/15 0445) Pulse Rate:  [0-173] 0 (06/15 1436) Resp:  [0-26] 0 (06/15 1436) BP: (86-147)/(63-109) 110/84 (06/15 1412) SpO2:  [0 %-100 %] 0 % (06/15 1436) Weight:  [89.5 kg] 89.5 kg (06/15 1256) Last BM Date: 06/03/21  Weight change: Filed Weights   06/06/2021 0904 05/23/2021 1256  Weight: 87.1 kg 89.5 kg    Intake/Output:   Intake/Output Summary (Last 24 hours) at 05/31/2021 1547 Last data filed at 06/20/2021 1526 Gross per 24 hour  Intake 860.28 ml  Output 50 ml  Net 810.28 ml      Physical Exam   See below   EKG    Sinus bradycardia 46 bpm   Labs   Basic Metabolic Panel: Recent Labs  Lab 06/16/2021 1003 06/14/2021 0450 06/09/2021 1506 06/18/2021 1510 06/16/2021 1515 05/24/2021 1529 06/06/2021 1537  NA 140 139 137 136 135 132* 136  K 3.9 3.8 4.6 4.6 4.5 3.7 2.9*  CL 108 110  --  106 117*  --   --   CO2 25 23  --   --   --   --   --   GLUCOSE 94 81  --  321* 259*  --   --   BUN 16 16  --  16 13  --   --   CREATININE 1.67* 1.50*  --  1.30* 1.10*  --   --   CALCIUM 9.0 8.9  --   --   --   --   --   MG  --  1.9  --   --   --   --   --     Liver Function Tests: No results for input(s): AST, ALT, ALKPHOS, BILITOT, PROT, ALBUMIN in the last 168 hours. No results for input(s): LIPASE, AMYLASE in the last 168 hours. No results for input(s): AMMONIA in the last 168 hours.  CBC: Recent Labs  Lab 06/09/2021 1003 06/09/2021 0450 06/03/2021 1506 05/26/2021 1510 06/08/2021 1515 06/20/2021 1529 06/09/2021 1537  WBC 7.0 8.2  --   --   --   --   --   HGB 13.2 12.2 13.3 13.3 10.5* 7.1* 7.1*  HCT 42.2 38.8 39.0 39.0 31.0* 21.0* 21.0*  MCV 91.3 90.2  --   --   --   --   --   PLT 230 232  --   --   --   --   --  Cardiac Enzymes: No results for input(s): CKTOTAL, CKMB, CKMBINDEX, TROPONINI in the last 168 hours.  BNP: BNP (last 3 results) No results for input(s): BNP in the last  8760 hours.  ProBNP (last 3 results) No results for input(s): PROBNP in the last 8760 hours.   CBG: Recent Labs  Lab 05/25/2021 1638 06/06/2021 2053 05/23/2021 0734 06/02/2021 1105 05/29/2021 1259  GLUCAP 102* 122* 92 91 84    Coagulation Studies: No results for input(s): LABPROT, INR in the last 72 hours.   Imaging   CARDIAC CATHETERIZATION  Result Date: 06/11/2021  Mid LM to Dist LM lesion is 80% stenosed. Has the appearance of spontaneous coronary artery dissection  Ost Cx to Prox Cx lesion is 80% stenosed. Has the appearance of extension from the left main dissection into the proximal circumflex.  Prox LAD lesion is 100% stenosed. Occlusion resulting from extension of the left main dissection into the LAD.  LV end diastolic pressure is normal.  There is no aortic valve stenosis.  Successful placement of Impella CP device in the right groin.  CT surgery consult obtained.  She will go to the operating room for emergent bypass surgery.  Will convey message to anesthesia to look for larger aortic dissection with their TEE probe.   ECHOCARDIOGRAM COMPLETE  Result Date: 06/06/2021    ECHOCARDIOGRAM REPORT   Patient Name:   [name] [name] Date of Exam: 06/06/2021 Medical Rec #:  [id number]       Height:       63.0 in Accession #:    [id number]      Weight:       192.0 lb Date of Birth:  [birth year]        BSA:          1.900 m Patient Age:    1 years        BP:           107/85 mmHg Patient Gender: F               HR:           47 bpm. Exam Location:  [facility] Procedure: 2D Echo, Cardiac Doppler and Color Doppler Indications:    Chest Pain R07.9  History:        Patient has prior history of Echocardiogram examinations, most                 recent 01/14/2016. Stroke, Arrythmias:Atrial Fibrillation and                 Bradycardia; Risk Factors:Hypertension and Current Smoker. WPW                 (Wolff-Parkinson-White syndrome) (From Hx).  Sonographer:    Alvino Chapel RCS Referring Phys: [id number]  Redbird Smith D [place]  1. Left ventricular ejection fraction, by estimation, is 60 to 65%. The left ventricle has normal function. The left ventricle has no regional wall motion abnormalities. There is mild left ventricular hypertrophy. Left ventricular diastolic parameters are indeterminate. Elevated left atrial pressure.  2. Right ventricular systolic function is normal. The right ventricular size is normal. There is mildly elevated pulmonary artery systolic pressure.  3. The mitral valve is normal in structure. Mild mitral valve regurgitation. No evidence of mitral stenosis.  4. The aortic valve is tricuspid. There is mild calcification of the aortic valve. There is mild thickening of the aortic valve. Aortic valve regurgitation is not visualized. No aortic stenosis is present.  5. Mild pulmonary HTN, PASP is 36 mmHg.  6. The inferior vena cava is normal in size with greater than 50% respiratory variability, suggesting right atrial pressure of 3 mmHg. FINDINGS  Left Ventricle: Left ventricular ejection fraction, by estimation, is 60 to 65%. The left ventricle has normal function. The left ventricle has no regional wall motion abnormalities. The left ventricular internal cavity size was normal in size. There is  mild left ventricular hypertrophy. Left ventricular diastolic parameters are indeterminate. Elevated left atrial pressure. Right Ventricle: The right ventricular size is normal. Right vetricular wall thickness was not assessed. Right ventricular systolic function is normal. There is mildly elevated pulmonary artery systolic pressure. The tricuspid regurgitant velocity is 2.86 m/s, and with an assumed right atrial pressure of 8 mmHg, the estimated right ventricular systolic pressure is 27.2 mmHg. Left Atrium: Left atrial size was normal in size. Right Atrium: Right atrial size was normal in size. Pericardium: There is no evidence of pericardial effusion. Mitral Valve: The mitral valve is normal in  structure. There is mild thickening of the mitral valve leaflet(s). There is mild calcification of the mitral valve leaflet(s). Mild mitral annular calcification. Mild mitral valve regurgitation. No evidence of  mitral valve stenosis. Tricuspid Valve: The tricuspid valve is normal in structure. Tricuspid valve regurgitation is mild . No evidence of tricuspid stenosis. Aortic Valve: The aortic valve is tricuspid. There is mild calcification of the aortic valve. There is mild thickening of the aortic valve. There is mild aortic valve annular calcification. Aortic valve regurgitation is not visualized. No aortic stenosis  is present. Pulmonic Valve: The pulmonic valve was not well visualized. Pulmonic valve regurgitation is not visualized. No evidence of pulmonic stenosis. Aorta: The aortic root is normal in size and structure. Pulmonary Artery: Mild pulmonary HTN, PASP is 36 mmHg. Venous: The inferior vena cava is normal in size with greater than 50% respiratory variability, suggesting right atrial pressure of 3 mmHg. IAS/Shunts: No atrial level shunt detected by color flow Doppler.  LEFT VENTRICLE PLAX 2D LVIDd:         4.40 cm  Diastology LVIDs:         2.50 cm  LV e' medial:    6.53 cm/s LV PW:         1.00 cm  LV E/e' medial:  15.9 LV IVS:        1.20 cm  LV e' lateral:   6.31 cm/s LVOT diam:     1.80 cm  LV E/e' lateral: 16.5 LV SV:         61 LV SV Index:   32 LVOT Area:     2.54 cm  RIGHT VENTRICLE RV S prime:     11.60 cm/s TAPSE (M-mode): 2.2 cm LEFT ATRIUM             Index       RIGHT ATRIUM           Index LA diam:        4.30 cm 2.26 cm/m  RA Area:     15.90 cm LA Vol (A2C):   41.4 ml 21.79 ml/m RA Volume:   43.05 ml  22.65 ml/m LA Vol (A4C):   46.9 ml 24.68 ml/m LA Biplane Vol: 44.2 ml 23.26 ml/m  AORTIC VALVE LVOT Vmax:   108.00 cm/s LVOT Vmean:  66.100 cm/s LVOT VTI:    0.239 m  AORTA Ao Root diam: 2.90 cm MITRAL VALVE  TRICUSPID VALVE MV Area (PHT): 3.53 cm     TR Peak grad:    32.7 mmHg MV Decel Time: 215 msec     TR Vmax:        286.00 cm/s MV E velocity: 104.00 cm/s MV A velocity: 82.50 cm/s   SHUNTS MV E/A ratio:  1.26         Systemic VTI:  0.24 m                             Systemic Diam: 1.80 cm Carlyle Dolly MD Electronically signed by Carlyle Dolly MD Signature Date/Time: 06/15/2021/6:15:59 PM    Final      Medications:     Current Medications:  [MAR Hold] amLODipine  5 mg Oral QPM   aspirin  81 mg Oral Pre-Cath   [MAR Hold] aspirin EC  81 mg Oral Daily   heparin-papaverine-plasmalyte irrigation   Irrigation To OR   [MAR Hold] insulin aspart  0-5 Units Subcutaneous QHS   [MAR Hold] insulin aspart  0-9 Units Subcutaneous TID WC   insulin   Intravenous To OR   [MAR Hold] levothyroxine  88 mcg Oral Daily   magnesium sulfate  40 mEq Other To OR   [MAR Hold] nitroGLYCERIN  1 inch Topical Q6H   phenylephrine  30-200 mcg/min Intravenous To OR   potassium chloride  80 mEq Other To OR   [MAR Hold] rosuvastatin  40 mg Oral q1800   [MAR Hold] sodium chloride flush  3 mL Intravenous Q12H   [MAR Hold] sodium chloride flush  3 mL Intravenous Q12H   tranexamic acid  2 mg/kg Intracatheter To OR    Infusions:  [MAR Hold] sodium chloride     sodium chloride 1 mL/kg/hr (05/29/2021 1037)    ceFAZolin (ANCEF) IV      ceFAZolin (ANCEF) IV     dexmedetomidine     heparin 30,000 units/NS 1000 mL solution for CELLSAVER     heparin 650 Units/hr (05/26/2021 1426)   milrinone     nitroGLYCERIN     tranexamic acid (CYKLOKAPRON) infusion ([facility])     vancomycin        Patient Profile   60 y/o AAF w/ HTN admitted w/ CP/ ACS w/ cath showing LM SCAD, developed CGS intubated and Impella support placed, underwent emergent CABG.   Assessment/Plan   SCAD - LHC showed LM SCAD w/ no distal flow on 2nd injection>>Emergent CABG   2. Cardiogenic Shock  - Impella support   3. AKI    See below  Length of Stay: 0  Lyda Jester, PA-C  05/29/2021, 3:47 PM  Advanced  Heart Failure Team Pager [phone] (M-F; 7a - 5p)  Please contact [facility] for night-coverage after hours (4p -7a ) and weekends on [email or web address]  Agree with above.   60 y/o woman with HTN admitted with NSTEMI. Cath with LM SCAD followed by diffuse left coronary system dissection with emergent Impella placement and transport to OR for CABG  In OR had SVG-OM and LIMA to LAD (native LAD heavily dissected)  Unable to come off pump with initial post-op EF 20%. Placed on VA ECMO. Follow-up TEE images with EF 40-45% and mild RV dysfunction   Now in ICU on ECMO with Impella vent (see ECMO note)  General:  Intubated sedated HEENT: normal + ETT Neck: supple. RIJ swan  Carotids 2+ bilat; no bruits.  Cor: chest open with VA ecmo cannulas and  CTs Lungs: coarse Abdomen: obese s+ distended. quiet Extremities: no cyanosis, clubbing, rash, 1+ edema Neuro: intubated/sedated  She is critically ill with post-cardiotomy shock in setting of severe SCA of entire left coronary system now s/p emergent CABG with failure to wean from pump.   Continue VA ecmo support. Run Impella P-1 to P-2. Titrate drips to MAP 70-90. CCM managing vent. Continue heparin with goal level 0.3-0.5. Supp lytes.   CRITICAL CARE Performed by: Glori Bickers  Total critical care time: 120 minutes  Critical care time was exclusive of separately billable procedures and treating other patients.  Critical care was necessary to treat or prevent imminent or life-threatening deterioration.  Critical care was time spent personally by me (independent of midlevel providers or residents) on the following activities: development of treatment plan with patient and/or surrogate as well as nursing, discussions with consultants, evaluation of patient's response to treatment, examination of patient, obtaining history from patient or surrogate, ordering and performing treatments and interventions, ordering and review of laboratory studies,  ordering and review of radiographic studies, pulse oximetry and re-evaluation of patient's condition.  Glori Bickers, MD  9:37 PM

## 2021-06-05 NOTE — Anesthesia Procedure Notes (Signed)
Central Venous Catheter Insertion Performed by: Roberts Gaudy, MD, anesthesiologist Start/End06/02/2021 2:25 AM, 05/31/2021 2:35 AM Patient location: Pre-op. Preanesthetic checklist: patient identified, IV checked, site marked, risks and benefits discussed, surgical consent, monitors and equipment checked, pre-op evaluation, timeout performed and anesthesia consent Hand hygiene performed  and maximum sterile barriers used  PA cath was placed.Swan type:thermodilution Procedure performed without using ultrasound guided technique. Attempts: 1 Following insertion, line sutured, dressing applied and Biopatch. Post procedure assessment: blood return through all ports, free fluid flow and no air  Patient tolerated the procedure well with no immediate complications.

## 2021-06-05 NOTE — Progress Notes (Signed)
[place] for IV Heparin Indication:  ECMO and Impella  No Known Allergies  Patient Measurements: Height: 5\' 3"  (160 cm) Weight: 89.5 kg (197 lb 5 oz) IBW/kg (Calculated) : 52.4 Heparin Dosing Weight: 72 kg  Vital Signs: Temp: 97.2 F (36.2 C) (06/15 2300) BP: 110/84 (06/15 1412) Pulse Rate: 80 (06/15 2300)  Labs: Recent Labs    05/30/2021 2033 06/16/2021 2234 06/15/2021 0006 06/06/2021 0450 06/11/2021 0851 05/26/2021 1506 05/27/2021 1630 06/18/2021 1644 05/26/2021 1738 05/27/2021 1823 05/22/2021 1845 06/03/2021 1907 06/08/2021 1927 06/03/2021 2055 06/08/2021 2220 05/30/2021 2230  HGB  --   --   --  12.2  --    < > 8.2*   < > 8.5*   < > 8.5* 7.5* 9.2* 12.2  12.3  --   --   HCT  --   --   --  38.8  --    < > 25.5*   < > 25.0*   < > 25.0* 22.0* 27.0* 36.0  37.2  --   --   PLT  --   --   --  232  --   --  120*  --   --   --   --   --   --  104*  --   --   APTT  --   --   --   --   --   --   --   --   --   --   --   --   --   --   --  >200*  LABPROT  --   --   --   --   --   --   --   --   --   --   --   --   --   --   --  19.2*  INR  --   --   --   --   --   --   --   --   --   --   --   --   --   --   --  1.6*  HEPARINUNFRC  --   --  0.97*  --  0.95*  --   --   --   --   --   --   --   --   --  0.48  --   CREATININE  --   --   --  1.50*  --    < >  --    < > 1.40*  --  1.40*  --   --  1.76*  --   --   TROPONINIHS 1,070* 1,455* 1,903*  --   --   --   --   --   --   --   --   --   --   --   --   --    < > = values in this interval not displayed.     Estimated Creatinine Clearance: 36.1 mL/min (A) (by C-G formula based on SCr of 1.76 mg/dL (H)).   Medical History: Past Medical History:  Diagnosis Date   Arthritis    Brain aneurysm    Diabetes mellitus without complication ([place])    Headache    Hypertension        SVT (supraventricular tachycardia) ([place])    s/p radiofrequency catheter ablation for AVNRT 02/16/09 (Dr. Cristopher Peru)   Thyroid disease  PROCEDURE FOR THYROID 15 YRS AGO AT [facility]   WPW (Wolff-Parkinson-White syndrome)     Medications:  Infusions:   sodium chloride Stopped (05/23/2021 2234)   sodium chloride     [START ON 06/06/2021] sodium chloride     sodium chloride     sodium chloride     sodium chloride 10 mL/hr at 06/09/2021 2300   sodium chloride     sodium chloride     albumin human Stopped (05/31/2021 2155)   amiodarone 30 mg/hr (05/22/2021 2300)    ceFAZolin (ANCEF) IV 2 g (06/19/2021 2307)   dexmedetomidine (PRECEDEX) IV infusion 0.7 mcg/kg/hr (06/06/2021 2300)   dextrose 5 % and 0.45% NaCl     epinephrine 5 mcg/min (06/09/2021 2300)   famotidine (PEPCID) IV Stopped (06/16/2021 2257)   heparin 50 units/mL (Impella PURGE) in dextrose 5 % 1000 mL bag     heparin 500 Units/hr (06/03/2021 2300)   HYDROmorphone 1 mg/hr (06/02/2021 2300)   insulin 2 Units/hr (06/08/2021 2300)   lactated ringers     lactated ringers 20 mL/hr at 06/11/2021 2300   lactated ringers     magnesium sulfate 20 mL/hr at 05/25/2021 2300   milrinone 0.25 mcg/kg/min (05/29/2021 2300)   nitroGLYCERIN     norepinephrine (LEVOPHED) Adult infusion 2 mcg/min (05/31/2021 2141)   phenylephrine (NEO-SYNEPHRINE) Adult infusion     potassium chloride 10 mEq (06/19/2021 2342)   [START ON 06/06/2021] vancomycin      Assessment: 60 years of age female s/p CABG x2 on VA ECMO with Impella to start IV heparin per pharmacy dosing protocol.   ACT low at 140-starting Impella purge at 50 units/mL.  Prior Heparin levels were elevated on rate of 800 units/hr prior to procedures.   Paltets 120 >>104. Hgb 12.3 after 2 units PRBCs in OR.  6/15 PM update:  Impella heparin currently running at 15.9 mL/hr (50 units/mL) providing 795 units/hr plus systemic heparin running at 500 units/hr for a total of 1295 units/hr of heparin  Heparin level is therapeutic at 0.48  No current issues per RN  Goal of Therapy:  Heparin level 0.3 to 0.5 units/ml Monitor platelets by anticoagulation  protocol: Yes   Plan:  Cont systemic heparin at 500 units/hr plus heparin purge at 15.9 mL/hr (795 units/hr) for a total heparin dose of 1295 units/hr Re-check heparin level in 6 hours  Narda Bonds, PharmD, [place] Pharmacist Phone: [phone]

## 2021-06-05 NOTE — Anesthesia Procedure Notes (Signed)
Arterial Line Insertion Start/End06/13/2022 2:30 PM, 06/11/2021 2:37 PM Performed by: Betha Loa, CRNA, CRNA  Preanesthetic checklist: patient identified, IV checked, site marked, risks and benefits discussed, surgical consent, monitors and equipment checked, pre-op evaluation, timeout performed and anesthesia consent Left, radial was placed Catheter size: 20 G Maximum sterile barriers used  Allen's test indicative of satisfactory collateral circulation Attempts: 1 Procedure performed using ultrasound guided technique. Ultrasound Notes:anatomy identified, needle tip was noted to be adjacent to the nerve/plexus identified and no ultrasound evidence of intravascular and/or intraneural injection Following insertion, Biopatch and dressing applied. Patient tolerated the procedure well with no immediate complications.

## 2021-06-05 NOTE — Progress Notes (Addendum)
Progress Note  Patient Name: [name] [name] Date of Encounter: 05/29/2021  [facility] Cardiologist: Evaluated by Dr. Lovena Le in 2010 - No outpatient follow-up since   Subjective   She reports intermittent episodes of chest pain overnight which improved with SL NTG. Breathing at baseline. No orthopnea or PND. Has been NPO since midnight.   Inpatient Medications    Scheduled Meds:  amLODipine  5 mg Oral QPM   aspirin EC  81 mg Oral Daily   insulin aspart  0-5 Units Subcutaneous QHS   insulin aspart  0-9 Units Subcutaneous TID WC   levothyroxine  88 mcg Oral Daily   nitroGLYCERIN  1 inch Topical Q6H   rosuvastatin  40 mg Oral q1800   sodium chloride flush  3 mL Intravenous Q12H   sodium chloride flush  3 mL Intravenous Q12H   Continuous Infusions:  sodium chloride     sodium chloride     Followed by   sodium chloride     heparin 800 Units/hr (05/23/2021 0143)   PRN Meds: sodium chloride, acetaminophen **OR** acetaminophen, hydrALAZINE, nitroGLYCERIN, ondansetron **OR** ondansetron (ZOFRAN) IV, sodium chloride flush   Vital Signs    Vitals:   06/20/2021 2151 06/14/2021 2200 06/09/2021 0004 05/24/2021 0445  BP: 137/78 113/68 124/66 126/66  Pulse: (!) 48 (!) 50 (!) 52 (!) 55  Resp: 16 16 18 18   Temp:   97.6 F (36.4 C) 98.3 F (36.8 C)  TempSrc:    Oral  SpO2: 100% 100% 98% 98%  Weight:      Height:        Intake/Output Summary (Last 24 hours) at 06/18/2021 0825 Last data filed at 05/22/2021 0300 Gross per 24 hour  Intake 800.28 ml  Output --  Net 800.28 ml   Last 3 Weights 06/20/2021 07/04/2019 03/17/2018  Weight (lbs) 192 lb 232 lb 3.2 oz 230 lb  Weight (kg) 87.091 kg 105.325 kg 104.327 kg      Telemetry    Sinus bradycardia, HR in 50's with occasional PVC's. - Personally Reviewed  ECG    Sinus bradycardia, HR 49 with inferior infarct pattern. - Personally Reviewed  Physical Exam   GEN: Pleasant female appearing in no acute distress.   Neck: No  JVD Cardiac: Regular rhythm, bradycardiac rate. No murmurs, rubs, or gallops.  Respiratory: Clear to auscultation bilaterally. GI: Soft, nontender, non-distended  MS: No pitting edema; No deformity. Neuro:  Nonfocal  Psych: Normal affect   Labs    High Sensitivity Troponin:   Recent Labs  Lab 06/16/2021 1550 05/31/2021 1735 06/06/2021 2033 05/26/2021 2234 06/03/2021 0006  TROPONINIHS 206* 372* 1,070* 1,455* 1,903*      Chemistry Recent Labs  Lab 06/09/2021 1003 05/23/2021 0450  NA 140 139  K 3.9 3.8  CL 108 110  CO2 25 23  GLUCOSE 94 81  BUN 16 16  CREATININE 1.67* 1.50*  CALCIUM 9.0 8.9  GFRNONAA 35* 40*  ANIONGAP 7 6     Hematology Recent Labs  Lab 06/11/2021 1003 05/26/2021 0450  WBC 7.0 8.2  RBC 4.62 4.30  HGB 13.2 12.2  HCT 42.2 38.8  MCV 91.3 90.2  MCH 28.6 28.4  MCHC 31.3 31.4  RDW 16.3* 15.9*  PLT 230 232    BNPNo results for input(s): BNP, PROBNP in the last 168 hours.   DDimer No results for input(s): DDIMER in the last 168 hours.   Radiology    DG Chest 1 View  Result Date: 05/28/2021 CLINICAL DATA:  Chest pain since early this morning, hypertension, smoker EXAM: CHEST  1 VIEW COMPARISON:  Portable exam 0938 hours compared to 07/21/2017 FINDINGS: VP shunt tubing traverses RIGHT hemithorax. Borderline enlargement of cardiac silhouette. Mediastinal contours and pulmonary vascularity normal. Minimal subsegmental atelectasis RIGHT upper lobe. Lungs otherwise clear. No pulmonary infiltrate, pleural effusion, or pneumothorax. Bones demineralized. IMPRESSION: Minimal subsegmental atelectasis RIGHT upper lobe. Electronically Signed   By: Lavonia Dana M.D.   On: 05/22/2021 09:52    Cardiac Studies   Echocardiogram: 06/02/2021 IMPRESSIONS     1. Left ventricular ejection fraction, by estimation, is 60 to 65%. The  left ventricle has normal function. The left ventricle has no regional  wall motion abnormalities. There is mild left ventricular hypertrophy.  Left  ventricular diastolic parameters  are indeterminate. Elevated left atrial pressure.   2. Right ventricular systolic function is normal. The right ventricular  size is normal. There is mildly elevated pulmonary artery systolic  pressure.   3. The mitral valve is normal in structure. Mild mitral valve  regurgitation. No evidence of mitral stenosis.   4. The aortic valve is tricuspid. There is mild calcification of the  aortic valve. There is mild thickening of the aortic valve. Aortic valve  regurgitation is not visualized. No aortic stenosis is present.   5. Mild pulmonary HTN, PASP is 36 mmHg.   6. The inferior vena cava is normal in size with greater than 50%  respiratory variability, suggesting right atrial pressure of 3 mmHg.   Patient Profile     60 y.o. female w/PMH of SVT (s/p prior ablation in 2010), HTN, HLD, Hypothyroidism and prior CVA who is currently admitted for an NSTEMI.   Assessment & Plan    1. Chest Pain/Elevated Troponin Values - Her episodes of chest pain were somewhat atypical on admission as a prior episode earlier with week occurred after consuming a cheeseburger but she had recurrent pain yesterday morning which persisted. Reports intermittent episodes of chest pain overnight which have resolved with SL NTG.  - Initial Hs Troponin was 44 and repeat values overnight have trended up, now at 1903 on most recent check. Echo shows a preserved EF with no regional WMA. Noted to have mild pulmonary HTN.  - Discussed with Dr. Johnsie Cancel and will plan for transfer to [facility] for a cardiac catheterization. The procedure along with risks and benefits were reviewed with the patient and she is in agreement to proceed. The patient understands that risks include but are not limited to stroke (1 in 1000), death (1 in 79), kidney failure [usually temporary] (1 in 500), bleeding (1 in 200), allergic reaction [possibly serious] (1 in 200).   - Continue Heparin, ASA 81mg  daily and Crestor  40mg  daily. No BB given baseline bradycardia. Will order NTG patch given intermittent episodes overnight but currently pain-free.    2. Bradycardia - HR has been in the 40's to 50's. No significant pauses or high-grade AV block. Possibly secondary to her infarct. Continue to avoid AV nodal blocking agents.    3. History of SVT - S/p prior ablation in 2010 by Dr. Lovena Le. She denies any recent palpitations and has been in sinus rhythm by review of telemetry.    4. HTN - BP has been well-controlled at 107/85 - 137/78 since admission. Remains on Amlodipine 5mg  daily. Valsartan-HCTZ held in anticipation of her catheterization.   5. HLD - FLP this admission shows total cholesterol of 138, HDL 40 and LDL 82. Currently on Crestor 40mg   daily. If LDL remains above goal, would plan to add Zetia.    6. Stage 3 CKD - Creatinine was previously at 2.20 in 07/2020. Improved to 1.67 on admission and at 1.50 today. Will hydrate pre-cath. Continue to hold PTA HCTZ and Valsartan until after her catheterization.    For questions or updates, please contact [place] Please consult [email or web address] for contact info under        Signed, Erma Heritage, PA-C  06/18/2021, 8:25 AM    Patient examined chart reviewed Discussed care with patient and PA Exam remarkable for good right radial pulse, alopecia clear lungs no murmur She is pain free this am but troponin continues to climb now over 1000 On heparin Asymptomatic bradycardia not on beta blocker. Cr stable being hydrated and on heparin. Transfer to [facility] for cath today. Risks including contrast reaction , bleeding , stroke MI and need for emergency surgery discussed willing to proceed  Jenkins Rouge MD [facility]

## 2021-06-05 NOTE — Consult Note (Signed)
NAME:  [name] [name], MRN:  [id number], DOB:  1961/12/05, LOS: 0 ADMISSION DATE:  06/08/2021, CONSULTATION DATE:  05/29/2021 REFERRING MD:  ECMO team, CHIEF COMPLAINT:  chest pain   History of Present Illness:  60 year old woman with hx of HTN, HLD, hypothyroidism, CVA, DM2 who presented with angina.  Underwent cardiac cath which showed left main dissection, during this procedure became acutely unstable with crushing chest pain and shock.  Taken emergently to OR for bypass after 3-5 impella placed.  Tough to wean from bypass so left centrally cannulated on VA ECMO and sent to [facility].  PCCM consulted to help with management.  Pertinent  Medical History  SVT post radiofrequency ablation WPW Prior brain aneurysm DM2 HTN HLD HA  Significant Hospital Events: Including procedures, antibiotic start and stop dates in addition to other pertinent events   6/14 admitted 6/15, cath >> CABG x 2 >> VA ECMO  Interim History / Subjective:  Patient seen in OR  Objective   Blood pressure 110/84, pulse (!) 0, temperature 98.3 F (36.8 C), temperature source Oral, resp. rate (!) 0, height 5\' 3"  (1.6 m), weight 89.5 kg, SpO2 (!) 0 %.        Intake/Output Summary (Last 24 hours) at 06/09/2021 1835 Last data filed at 05/31/2021 1834 Gross per 24 hour  Intake 2820.28 ml  Output 760 ml  Net 2060.28 ml   Filed Weights   06/06/2021 0904 06/09/2021 1256  Weight: 87.1 kg 89.5 kg    Examination: On table Chest open On pump Ao to RA cannulation  Labs/imaging that I havepersonally reviewed  (right click and "Reselect all SmartList Selections" daily)  ABG 7.3/45/407  Pre op Cr 1.5 Mg 1.9 K 3.8 BUN 16 WBC 8 Hgb 12 Plt 232  Resolved Hospital Problem list   N/a  Assessment & Plan:  ACS secondary to left main spontaneous coronary artery dissection s/p emergent CABG with presumed postop cardiac stunning despite impella unable to wean from pump on VA ECMO 05/25/2021. Acute hypoxemic respiratory failure-  pulmonary edema and poor forward flow Hx brain aneurysm, HA, HTN, DM2, arthritis, hypothyroidism, chronic anxiety - Lung protective tidal volumes, VAP prevention bundle - PAD protocol targeting RASS -4 and/or vent synchrony - Usual transfusion threshold - AC/circuit per CHF and TCTS teams - Dr. Lynetta Mare is on tonight if any issues, I will keep my phone on also should there be issues  Best practice (right click and "Reselect all SmartList Selections" daily)  Per primary  Labs   CBC: Recent Labs  Lab 06/09/2021 1003 05/22/2021 0450 06/14/2021 1506 06/06/2021 1630 05/31/2021 1644 06/08/2021 1712 06/15/2021 1738 05/28/2021 1823  WBC 7.0 8.2  --   --   --   --   --   --   HGB 13.2 12.2   < > 8.2* 8.2* 9.2* 8.5* 8.2*  HCT 42.2 38.8   < > 25.5* 24.0* 27.0* 25.0* 24.0*  MCV 91.3 90.2  --   --   --   --   --   --   PLT 230 232  --  120*  --   --   --   --    < > = values in this interval not displayed.    Basic Metabolic Panel: Recent Labs  Lab 06/02/2021 1003 06/18/2021 0450 06/15/2021 1506 06/19/2021 1510 06/19/2021 1515 06/08/2021 1529 06/03/2021 1603 06/02/2021 1629 06/19/2021 1644 06/19/2021 1712 06/11/2021 1738 05/27/2021 1823  NA 140 139   < > 136 135   < >  137 140 139 139 141 142  K 3.9 3.8   < > 4.6 4.5   < > 4.4 5.0 5.7* 5.6* 5.0 4.1  CL 108 110  --  106 117*  --  100  --  103  --  112*  --   CO2 25 23  --   --   --   --   --   --   --   --   --   --   GLUCOSE 94 81  --  321* 259*  --  274*  --  244*  --  202*  --   BUN 16 16  --  16 13  --  13  --  15  --  15  --   CREATININE 1.67* 1.50*  --  1.30* 1.10*  --  1.30*  --  1.50*  --  1.40*  --   CALCIUM 9.0 8.9  --   --   --   --   --   --   --   --   --   --   MG  --  1.9  --   --   --   --   --   --   --   --   --   --    < > = values in this interval not displayed.   GFR: Estimated Creatinine Clearance: 45.3 mL/min (A) (by C-G formula based on SCr of 1.4 mg/dL (H)). Recent Labs  Lab 05/31/2021 1003 05/29/2021 0450  WBC 7.0 8.2    Liver  Function Tests: No results for input(s): AST, ALT, ALKPHOS, BILITOT, PROT, ALBUMIN in the last 168 hours. No results for input(s): LIPASE, AMYLASE in the last 168 hours. No results for input(s): AMMONIA in the last 168 hours.  ABG    Component Value Date/Time   PHART 7.301 (L) 05/22/2021 1823   PCO2ART 45.4 06/15/2021 1823   PO2ART 407 (H) 05/22/2021 1823   HCO3 22.4 05/25/2021 1823   TCO2 24 06/06/2021 1823   ACIDBASEDEF 4.0 (H) 06/09/2021 1823   O2SAT 100.0 06/11/2021 1823     Coagulation Profile: No results for input(s): INR, PROTIME in the last 168 hours.  Cardiac Enzymes: No results for input(s): CKTOTAL, CKMB, CKMBINDEX, TROPONINI in the last 168 hours.  HbA1C: No results found for: HGBA1C  CBG: Recent Labs  Lab 06/11/2021 1638 05/29/2021 2053 06/06/2021 0734 05/31/2021 1105 06/15/2021 1259  GLUCAP 102* 122* 92 91 84    Review of Systems:   Comatose on vent  Past Medical History:  She,  has a past medical history of Arthritis, Brain aneurysm, Diabetes mellitus without complication ([place]), Headache, Hypertension, SVT (supraventricular tachycardia) ([place]), Thyroid disease, and WPW (Wolff-Parkinson-White syndrome).   Surgical History:   Past Surgical History:  Procedure Laterality Date   BREAST SURGERY     BX RT BREAST  BENIGN   CRANIOTOMY N/A 01/04/2016   Procedure: Suboccipital Craniotomy and Cervical one Laminectomy for Clipping of Aneurysm;  Surgeon: Kevan Ny Ditty, MD;  Location: [place] [facility];  Service: Neurosurgery;  Laterality: N/A;   CRANIOTOMY N/A 07/09/2016   Procedure: Craniotomy for clipping of left middle cerebral artery aneurysm;  Surgeon: Kevan Ny Ditty, MD;  Location: [place] [facility];  Service: Neurosurgery;  Laterality: N/A;  Craniotomy for 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 Christella Noa;  Surgeon: Kevan Ny  Ditty,  MD;  Location: [facility];  Service: Neurosurgery;  Laterality: Right;   ELECTROPHYSIOLOGIC STUDY     LAPAROSCOPIC REVISION VENTRICULAR-PERITONEAL (V-P) SHUNT N/A 02/04/2016   Procedure: LAPAROSCOPIC Insertion VENTRICULAR-PERITONEAL (V-P) SHUNT;  Surgeon: Rolm Bookbinder, MD;  Location: [place] [facility];  Service: General;  Laterality: N/A;   RADIOLOGY WITH ANESTHESIA N/A 01/03/2016   Procedure: RADIOLOGY WITH ANESTHESIA;  Surgeon: Medication Radiologist, MD;  Location: [facility];  Service: Radiology;  Laterality: N/A;   VENTRICULOPERITONEAL SHUNT Right 02/04/2016   Procedure: SHUNT INSERTION VENTRICULAR-PERITONEAL With Laparoscopic Assistance;  Surgeon: Kevan Ny Ditty, MD;  Location: [place] [facility];  Service: Neurosurgery;  Laterality: Right;     Social History:   reports that she has been smoking cigarettes. She has been smoking an average of 0.50 packs per day. She has never used smokeless tobacco. She reports that she does not drink alcohol and does not use drugs.   Family History:  Her family history includes Cancer in her sister; Hypertension in her mother; Prostate cancer in her father. There is no history of Colon cancer or Colon polyps.   Allergies No Known Allergies   Home Medications  Prior to Admission medications   Medication Sig Start Date End Date Taking? Authorizing Provider  acetaminophen (TYLENOL) 500 MG tablet Take 1,000 mg by mouth daily as needed for moderate pain or headache.   Yes [provider]  amLODipine (NORVASC) 5 MG tablet Take 5 mg by mouth every evening.    Yes [provider]  JARDIANCE 10 MG TABS tablet Take 10 mg by mouth daily. 03/15/21  Yes [provider]  levothyroxine (SYNTHROID) 88 MCG tablet Take 88 mcg by mouth daily. 01/28/21  Yes [provider]  potassium chloride (KLOR-CON) 10 MEQ tablet Take 10 mEq by mouth daily. 04/24/21  Yes [provider]  rosuvastatin (CRESTOR) 40 MG tablet Take 40 mg by mouth daily. 01/28/21   Yes [provider]  valsartan-hydrochlorothiazide (DIOVAN-HCT) 160-25 MG tablet Take 1 tablet by mouth daily.   Yes [provider]  Vitamin D, Ergocalciferol, (DRISDOL) 1.25 MG (50000 UNIT) CAPS capsule Take 1 capsule by mouth once a week. 04/24/21  Yes [provider]  cyclobenzaprine (FLEXERIL) 10 MG tablet TAKE 1 TABLET BY MOUTH TWICE DAILY AS NEEDED FOR MUSCLE SPASMS Patient not taking: No sig reported 06/28/18   Carole Civil, MD  cyclobenzaprine (FLEXERIL) 10 MG tablet TAKE 1 TABLET BY MOUTH TWICE DAILY AS NEEDED FOR MUSCLE SPASMS Patient not taking: No sig reported 11/04/18   Carole Civil, MD  cyclobenzaprine (FLEXERIL) 10 MG tablet TAKE 1 TABLET BY MOUTH TWICE DAILY AS NEEDED FOR MUSCLE SPASMS Patient not taking: No sig reported 01/21/19   Carole Civil, MD  diazepam (VALIUM) 5 MG tablet Take 1 tablet (5 mg total) by mouth 2 (two) times daily. Patient not taking: No sig reported 03/14/18   Isla Pence, MD  gabapentin (NEURONTIN) 100 MG capsule TAKE 1 CAPSULE BY MOUTH THREE TIMES DAILY Patient not taking: No sig reported 04/21/19   Carole Civil, MD  HYDROcodone-acetaminophen (NORCO/VICODIN) 5-325 MG tablet Take 1 tablet by mouth every 4 (four) hours as needed. Patient not taking: No sig reported 03/14/18   Isla Pence, MD  naproxen (NAPROSYN) 500 MG tablet TAKE 1 TABLET BY MOUTH TWICE DAILY WITH A MEAL Patient not taking: No sig reported 06/29/19   Carole Civil, MD  VOLTAREN 1 % GEL Apply 2 g topically 4 (four) times daily as needed (  pain).  Patient not taking: Reported on 06/08/2021 02/27/18   [provider]     Critical care time: 32 minutes

## 2021-06-05 NOTE — Progress Notes (Signed)
ECMO NOTE:   Indication: Cardiogenic shock    Initial cannulation date: 06/02/2021   ECMO type: VA ECMO   Cannulation:   Central cannulation Asc Ao & RA   ECMO events:   - Initial cannulation 05/24/2021   Daily data:   Flow 3.0L RPM 2615 Sweep  2.0 L   Labs:   ABG    Component Value Date/Time   PHART 7.360 06/01/2021 1927   PCO2ART 33.5 06/09/2021 1927   PO2ART 386 (H) 06/09/2021 1927   HCO3 19.0 (L) 06/06/2021 1927   TCO2 20 (L) 06/19/2021 1927   ACIDBASEDEF 6.0 (H) 06/03/2021 1927   O2SAT 100.0 06/19/2021 1927    Hgb 12.3 Platelets 104 LDH pending Heparin level: pending Lactic acid 7.5   Plan:  - Continue VA ECMO - Titrate gtts to MAP 70-90 - Continue heparin with HL goal 0.3-0.5  Glori Bickers, MD  9:42 PM

## 2021-06-05 NOTE — OR Nursing (Signed)
Chest left open. 5 keepers retained and cover with [employer].

## 2021-06-05 NOTE — Progress Notes (Addendum)
IV heparin gtt infusing at 650 units/hour and IV NS infusing at 87.1 cc/hour upon arrival to [facility], cath lab holding, IV Heparin discontinued at 1245 for heart catheterization, safety maintained, no c/o pain at this time

## 2021-06-05 NOTE — Progress Notes (Signed)
ECMO INITIATION   Patient: [name] [name], Sep 15, 1961, 60 y.o. Location:   Date of Service:  06/01/2021     Time: 7:20 PM  Date of Admission: 06/03/2021 Admitting diagnosis: Chest pain  Ht: 5\' 3"  (160 cm) Wt: 89.5 kg BSA: Body surface area is 1.99 meters squared.  Blood Type: O POS Allergies: No Known Allergies  Past medical history:  Past Medical History:  Diagnosis Date   Arthritis    Brain aneurysm    Diabetes mellitus without complication (Meta)    Headache    Hypertension        SVT (supraventricular tachycardia) (HCC)    s/p radiofrequency catheter ablation for AVNRT 02/16/09 (Dr. Cristopher Peru)   Thyroid disease    PROCEDURE FOR THYROID 60 YRS AGO AT [facility]   WPW (Wolff-Parkinson-White syndrome)    Past surgical history:  Past Surgical History:  Procedure Laterality Date   BREAST SURGERY     BX RT BREAST  BENIGN   CRANIOTOMY N/A 01/04/2016   Procedure: Suboccipital Craniotomy and Cervical one Laminectomy for Clipping of Aneurysm;  Surgeon: Kevan Ny Ditty, MD;  Location: [facility];  Service: Neurosurgery;  Laterality: N/A;   CRANIOTOMY N/A 07/09/2016   Procedure: Craniotomy for clipping of left middle cerebral artery aneurysm;  Surgeon: Kevan Ny Ditty, MD;  Location: [place] [facility];  Service: Neurosurgery;  Laterality: N/A;  Craniotomy for 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 Christella Noa;  Surgeon: Kevan Ny Ditty, MD;  Location: [place];  Service: Neurosurgery;  Laterality: Right;   ELECTROPHYSIOLOGIC STUDY     LAPAROSCOPIC REVISION VENTRICULAR-PERITONEAL (V-P) SHUNT N/A 02/04/2016   Procedure: LAPAROSCOPIC Insertion VENTRICULAR-PERITONEAL (V-P) SHUNT;  Surgeon: Rolm Bookbinder, MD;  Location: [place] [facility];  Service: General;  Laterality: N/A;   RADIOLOGY WITH ANESTHESIA N/A 01/03/2016   Procedure: RADIOLOGY WITH ANESTHESIA;   Surgeon: Medication Radiologist, MD;  Location: [place] [facility] [place];  Service: Radiology;  Laterality: N/A;   VENTRICULOPERITONEAL SHUNT Right 02/04/2016   Procedure: SHUNT INSERTION VENTRICULAR-PERITONEAL With Laparoscopic Assistance;  Surgeon: Kevan Ny Ditty, MD;  Location: [place] [facility];  Service: Neurosurgery;  Laterality: Right;    Indication for ECMO: Post-Cardiotomy  ECMO was deployed at [employer] and initiated at 1800  Anticoagulation not given achieved. Prodiamine 150mg  given.  .    ECMO Cannula Information     Staff Present  Primary Perfusionist [place] Specialist Idelia Salm RN, Bobbye Morton RN  Cannulating Physician Roxan Hockey MD   ECMO Lot Numbers  CardioHelp Console  [id number]  Oxygenator  [id number]  Tubing Pack  [id number]  ECMO Goals  Flow goal     3.5  Anticoagulation goal     TBD  Cardiac goal       Respiratory goal       Other goal        ECMO Handoff  Patient Information * Age Height Weight BSA IBW BMI  60 y.o. 5\' 3"  (160 cm)  (89.5 kg Body surface area is 1.99 meters squared. No data recorded Body mass index is 34.95 kg/m.   Review History * Primary Diagnosis   Chest pain  Prior Cardiac Arrest within 24hrs of ECMO initiation? yes  ECMO and MCS * Type ECMO Flow ECMO Sweep Gases  VA 3.7 2             Additional Mechanical Support   Ventilation *  PRVC  Cannula Size and Locations   Right Atrium to Aorta     Drainage 36/46Fr   Return 21Fr    *Cannula(e) sutured and anchored, secured and dressed.   Labs and Imaging *  *Cnnulation position verified via imaging on arrival to ICU. Concerns communicated to attending surgeon. Labs reviewed.   All ECMO safety checks complete. ECMO flowsheet initiated, applicable charges captured, LDA's entered/confirmed, imaging and labs verified, blood products available, and report given to [facility].

## 2021-06-05 NOTE — Anesthesia Preprocedure Evaluation (Signed)
Anesthesia Evaluation  Patient identified by MRN, date of birth, ID band Patient confused    Reviewed: Unable to perform ROS - Chart review onlyPreop documentation limited or incomplete due to emergent nature of procedure.  Airway Mallampati: III       Dental  (+) Teeth Intact, Poor Dentition   Pulmonary Current Smoker and Patient abstained from smoking.,     + decreased breath sounds      Cardiovascular hypertension,  Rhythm:Regular Rate:Tachycardia     Neuro/Psych    GI/Hepatic   Endo/Other  diabetes  Renal/GU      Musculoskeletal   Abdominal   Peds  Hematology   Anesthesia Other Findings   Reproductive/Obstetrics                             Anesthesia Physical Anesthesia Plan  ASA: 4 and emergent  Anesthesia Plan: General   Post-op Pain Management:    Induction:   PONV Risk Score and Plan: Ondansetron  Airway Management Planned: Oral ETT  Additional Equipment: Arterial line, CVP, PA Cath and Ultrasound Guidance Line Placement  Intra-op Plan:   Post-operative Plan: Post-operative intubation/ventilation  Informed Consent: I have reviewed the patients History and Physical, chart, labs and discussed the procedure including the risks, benefits and alternatives for the proposed anesthesia with the patient or authorized representative who has indicated his/her understanding and acceptance.       Plan Discussed with: Anesthesiologist and CRNA  Anesthesia Plan Comments:         Anesthesia Quick Evaluation

## 2021-06-05 NOTE — Progress Notes (Signed)
ANTICOAGULATION CONSULT NOTE - Follow Up Consult  Pharmacy Consult for heparin Indication: chest pain/ACS   Labs: Recent Labs    05/28/2021 1003 05/23/2021 1550 06/03/2021 1735 05/28/2021 2033 05/27/2021 2234 06/16/2021 0006  HGB 13.2  --   --   --   --   --   HCT 42.2  --   --   --   --   --   PLT 230  --   --   --   --   --   HEPARINUNFRC  --   --   --   --   --  0.97*  CREATININE 1.67*  --   --   --   --   --   TROPONINIHS 44*   < > 372* 1,070* 1,455*  --    < > = values in this interval not displayed.    Assessment: 60yo female supratherapeutic on heparin with initial dosing for CP; no gtt issues or signs of bleeding per RN.  Goal of Therapy:  Heparin level 0.3-0.7 units/ml   Plan:  Will decrease heparin gtt by 1-2 units/kg/hr to 800 units/hr and check level in 8 hours.    Wynona Neat, PharmD, BCPS  05/25/2021,1:07 AM

## 2021-06-05 NOTE — H&P (View-Only) (Signed)
Progress Note  Patient Name: [name] [name] Date of Encounter: 06/11/2021  [facility] Cardiologist: Evaluated by Dr. Lovena Le in 2010 - No outpatient follow-up since   Subjective   She reports intermittent episodes of chest pain overnight which improved with SL NTG. Breathing at baseline. No orthopnea or PND. Has been NPO since midnight.   Inpatient Medications    Scheduled Meds:  amLODipine  5 mg Oral QPM   aspirin EC  81 mg Oral Daily   insulin aspart  0-5 Units Subcutaneous QHS   insulin aspart  0-9 Units Subcutaneous TID WC   levothyroxine  88 mcg Oral Daily   nitroGLYCERIN  1 inch Topical Q6H   rosuvastatin  40 mg Oral q1800   sodium chloride flush  3 mL Intravenous Q12H   sodium chloride flush  3 mL Intravenous Q12H   Continuous Infusions:  sodium chloride     sodium chloride     Followed by   sodium chloride     heparin 800 Units/hr (06/15/2021 0143)   PRN Meds: sodium chloride, acetaminophen **OR** acetaminophen, hydrALAZINE, nitroGLYCERIN, ondansetron **OR** ondansetron (ZOFRAN) IV, sodium chloride flush   Vital Signs    Vitals:   05/24/2021 2151 06/14/2021 2200 06/08/2021 0004 06/01/2021 0445  BP: 137/78 113/68 124/66 126/66  Pulse: (!) 48 (!) 50 (!) 52 (!) 55  Resp: 16 16 18 18   Temp:   97.6 F (36.4 C) 98.3 F (36.8 C)  TempSrc:    Oral  SpO2: 100% 100% 98% 98%  Weight:      Height:        Intake/Output Summary (Last 24 hours) at 06/11/2021 0825 Last data filed at 05/28/2021 0300 Gross per 24 hour  Intake 800.28 ml  Output --  Net 800.28 ml   Last 3 Weights 06/18/2021 07/04/2019 03/17/2018  Weight (lbs) 192 lb 232 lb 3.2 oz 230 lb  Weight (kg) 87.091 kg 105.325 kg 104.327 kg      Telemetry    Sinus bradycardia, HR in 50's with occasional PVC's. - Personally Reviewed  ECG    Sinus bradycardia, HR 49 with inferior infarct pattern. - Personally Reviewed  Physical Exam   GEN: Pleasant female appearing in no acute distress.   Neck: No  JVD Cardiac: Regular rhythm, bradycardiac rate. No murmurs, rubs, or gallops.  Respiratory: Clear to auscultation bilaterally. GI: Soft, nontender, non-distended  MS: No pitting edema; No deformity. Neuro:  Nonfocal  Psych: Normal affect   Labs    High Sensitivity Troponin:   Recent Labs  Lab 06/14/2021 1550 05/28/2021 1735 06/19/2021 2033 05/30/2021 2234 06/11/2021 0006  TROPONINIHS 206* 372* 1,070* 1,455* 1,903*      Chemistry Recent Labs  Lab 05/22/2021 1003 06/06/2021 0450  NA 140 139  K 3.9 3.8  CL 108 110  CO2 25 23  GLUCOSE 94 81  BUN 16 16  CREATININE 1.67* 1.50*  CALCIUM 9.0 8.9  GFRNONAA 35* 40*  ANIONGAP 7 6     Hematology Recent Labs  Lab 06/11/2021 1003 05/25/2021 0450  WBC 7.0 8.2  RBC 4.62 4.30  HGB 13.2 12.2  HCT 42.2 38.8  MCV 91.3 90.2  MCH 28.6 28.4  MCHC 31.3 31.4  RDW 16.3* 15.9*  PLT 230 232    BNPNo results for input(s): BNP, PROBNP in the last 168 hours.   DDimer No results for input(s): DDIMER in the last 168 hours.   Radiology    DG Chest 1 View  Result Date: 05/31/2021 CLINICAL DATA:  Chest pain since early this morning, hypertension, smoker EXAM: CHEST  1 VIEW COMPARISON:  Portable exam 0938 hours compared to 07/21/2017 FINDINGS: VP shunt tubing traverses RIGHT hemithorax. Borderline enlargement of cardiac silhouette. Mediastinal contours and pulmonary vascularity normal. Minimal subsegmental atelectasis RIGHT upper lobe. Lungs otherwise clear. No pulmonary infiltrate, pleural effusion, or pneumothorax. Bones demineralized. IMPRESSION: Minimal subsegmental atelectasis RIGHT upper lobe. Electronically Signed   By: Lavonia Dana M.D.   On: 05/23/2021 09:52    Cardiac Studies   Echocardiogram: 06/16/2021 IMPRESSIONS     1. Left ventricular ejection fraction, by estimation, is 60 to 65%. The  left ventricle has normal function. The left ventricle has no regional  wall motion abnormalities. There is mild left ventricular hypertrophy.  Left  ventricular diastolic parameters  are indeterminate. Elevated left atrial pressure.   2. Right ventricular systolic function is normal. The right ventricular  size is normal. There is mildly elevated pulmonary artery systolic  pressure.   3. The mitral valve is normal in structure. Mild mitral valve  regurgitation. No evidence of mitral stenosis.   4. The aortic valve is tricuspid. There is mild calcification of the  aortic valve. There is mild thickening of the aortic valve. Aortic valve  regurgitation is not visualized. No aortic stenosis is present.   5. Mild pulmonary HTN, PASP is 36 mmHg.   6. The inferior vena cava is normal in size with greater than 50%  respiratory variability, suggesting right atrial pressure of 3 mmHg.   Patient Profile     60 y.o. female w/PMH of SVT (s/p prior ablation in 2010), HTN, HLD, Hypothyroidism and prior CVA who is currently admitted for an NSTEMI.   Assessment & Plan    1. Chest Pain/Elevated Troponin Values - Her episodes of chest pain were somewhat atypical on admission as a prior episode earlier with week occurred after consuming a cheeseburger but she had recurrent pain yesterday morning which persisted. Reports intermittent episodes of chest pain overnight which have resolved with SL NTG.  - Initial Hs Troponin was 44 and repeat values overnight have trended up, now at 1903 on most recent check. Echo shows a preserved EF with no regional WMA. Noted to have mild pulmonary HTN.  - Discussed with Dr. Johnsie Cancel and will plan for transfer to [facility] for a cardiac catheterization. The procedure along with risks and benefits were reviewed with the patient and she is in agreement to proceed. The patient understands that risks include but are not limited to stroke (1 in 1000), death (1 in 60), kidney failure [usually temporary] (1 in 500), bleeding (1 in 200), allergic reaction [possibly serious] (1 in 200).   - Continue Heparin, ASA 81mg  daily and Crestor  40mg  daily. No BB given baseline bradycardia. Will order NTG patch given intermittent episodes overnight but currently pain-free.    2. Bradycardia - HR has been in the 40's to 50's. No significant pauses or high-grade AV block. Possibly secondary to her infarct. Continue to avoid AV nodal blocking agents.    3. History of SVT - S/p prior ablation in 2010 by Dr. Lovena Le. She denies any recent palpitations and has been in sinus rhythm by review of telemetry.    4. HTN - BP has been well-controlled at 107/85 - 137/78 since admission. Remains on Amlodipine 5mg  daily. Valsartan-HCTZ held in anticipation of her catheterization.   5. HLD - FLP this admission shows total cholesterol of 138, HDL 40 and LDL 82. Currently on Crestor 40mg   daily. If LDL remains above goal, would plan to add Zetia.    6. Stage 3 CKD - Creatinine was previously at 2.20 in 07/2020. Improved to 1.67 on admission and at 1.50 today. Will hydrate pre-cath. Continue to hold PTA HCTZ and Valsartan until after her catheterization.    For questions or updates, please contact [place] Please consult [email or web address] for contact info under        Signed, Erma Heritage, PA-C  06/06/2021, 8:25 AM    Patient examined chart reviewed Discussed care with patient and PA Exam remarkable for good right radial pulse, alopecia clear lungs no murmur She is pain free this am but troponin continues to climb now over 1000 On heparin Asymptomatic bradycardia not on beta blocker. Cr stable being hydrated and on heparin. Transfer to [facility] for cath today. Risks including contrast reaction , bleeding , stroke MI and need for emergency surgery discussed willing to proceed  Jenkins Rouge MD [facility]

## 2021-06-05 NOTE — Anesthesia Procedure Notes (Signed)
Procedure Name: Intubation Date/Time: 06/02/2021 2:00 PM Performed by: Valda Favia, CRNA Pre-anesthesia Checklist: Patient identified, Emergency Drugs available, Suction available and Patient being monitored Patient Re-evaluated:Patient Re-evaluated prior to induction Oxygen Delivery Method: Ambu bag Preoxygenation: Pre-oxygenation with 100% oxygen Induction Type: IV induction Ventilation: Mask ventilation without difficulty Laryngoscope Size: Glidescope and 4 Grade View: Grade I Tube type: Oral Tube size: 7.0 mm Number of attempts: 1 Airway Equipment and Method: Stylet and Oral airway Placement Confirmation: ETT inserted through vocal cords under direct vision, positive ETCO2, breath sounds checked- equal and bilateral and CO2 detector Secured at: 21 cm Tube secured with: Tape Dental Injury: Teeth and Oropharynx as per pre-operative assessment  Comments: Intubated in cath lab room 3. Dr. Linna Caprice at bedside.

## 2021-06-05 NOTE — Progress Notes (Signed)
PROGRESS NOTE    [name] [name]  CZY:[id number] DOB: August 21, 1961 DOA: 05/30/2021 PCP: Lucianne Lei, MD   Brief Narrative:   [name] [name] is a 60 y.o. female with medical history significant for SVT with prior ablation, hypertension, dyslipidemia, hypothyroidism, prior CVA, type 2 diabetes, and obesity who presented to the ED after waking up on the morning of admission with pain to her left sternal region.  She was admitted for chest pain evaluation with elevated troponin values now concerning for ACS.  She will be transferred to [facility] cardiology service for further evaluation via cardiac catheterization.  Assessment & Plan:   Active Problems:   Chest pain   Chest pain with elevated troponin -Started on heparin drip 6/14 due to elevating levels -2D echocardiogram without any wall motion abnormalities and preserved EF -Patient to transfer to [facility] for cardiology for catheterization -Currently n.p.o.   Bradycardia -Patient not currently on any AV nodal blockade agents -Possibly related to infarct, continue to hold AV nodal blockade agents   History of SVT with prior ablation -Currently in sinus rhythm -Prior ablation in 2010 by Dr. Lovena Le -Continue to monitor on telemetry   AKI versus CKD stage IIIb -Current creatinine 1.5 with prior values noted to be in similar range, but creatinine fluctuates -Hydration prior to cardiac catheterization today   History of hypothyroidism -Continue levothyroxine -Check TSH   History of type 2 diabetes -Hold Jardiance -SSI and carb modified diet   Hypertension -Continue amlodipine with good blood pressure control noted -Hold valsartan/HCTZ -IV hydralazine as needed for significant elevations   Dyslipidemia -Continue statin -Lipid profile with LDL 82   History of tobacco abuse -Counseled on cessation -Refuses nicotine patch   Obesity -Lifestyle changes outpatient   DVT prophylaxis:Heparin Code Status: Full Family  Communication: None at bedside Disposition Plan:  Status is: Observation  The patient will require care spanning > 2 midnights and should be moved to inpatient because: Ongoing diagnostic testing needed not appropriate for outpatient work up  Dispo: The patient is from: Home              Anticipated d/c is to: Home              Patient currently is not medically stable to d/c.   Difficult to place patient No  Consultants:  Cardiology  Procedures:  See below  Antimicrobials:  None   Subjective: Patient seen and evaluated today with intermittent episodes of chest pain overnight.  She has been n.p.o. overnight and heparin drip have been started yesterday on account of elevated troponin levels.  Objective: Vitals:   06/02/2021 2151 06/11/2021 2200 06/11/2021 0004 06/16/2021 0445  BP: 137/78 113/68 124/66 126/66  Pulse: (!) 48 (!) 50 (!) 52 (!) 55  Resp: 16 16 18 18   Temp:   97.6 F (36.4 C) 98.3 F (36.8 C)  TempSrc:    Oral  SpO2: 100% 100% 98% 98%  Weight:      Height:        Intake/Output Summary (Last 24 hours) at 05/29/2021 1103 Last data filed at 05/25/2021 0300 Gross per 24 hour  Intake 800.28 ml  Output --  Net 800.28 ml   Filed Weights   06/02/2021 0904  Weight: 87.1 kg    Examination:  General exam: Appears calm and comfortable  Respiratory system: Clear to auscultation. Respiratory effort normal. Cardiovascular system: S1 & S2 heard, RRR.  Gastrointestinal system: Abdomen is soft Central nervous system: Alert and awake Extremities:  No edema Skin: No significant lesions noted Psychiatry: Flat affect.    Data Reviewed: I have personally reviewed following labs and imaging studies  CBC: Recent Labs  Lab 05/27/2021 1003 06/03/2021 0450  WBC 7.0 8.2  HGB 13.2 12.2  HCT 42.2 38.8  MCV 91.3 90.2  PLT 230 308   Basic Metabolic Panel: Recent Labs  Lab 05/28/2021 1003 05/22/2021 0450  NA 140 139  K 3.9 3.8  CL 108 110  CO2 25 23  GLUCOSE 94 81  BUN 16  16  CREATININE 1.67* 1.50*  CALCIUM 9.0 8.9  MG  --  1.9   GFR: Estimated Creatinine Clearance: 41.7 mL/min (A) (by C-G formula based on SCr of 1.5 mg/dL (H)). Liver Function Tests: No results for input(s): AST, ALT, ALKPHOS, BILITOT, PROT, ALBUMIN in the last 168 hours. No results for input(s): LIPASE, AMYLASE in the last 168 hours. No results for input(s): AMMONIA in the last 168 hours. Coagulation Profile: No results for input(s): INR, PROTIME in the last 168 hours. Cardiac Enzymes: No results for input(s): CKTOTAL, CKMB, CKMBINDEX, TROPONINI in the last 168 hours. BNP (last 3 results) No results for input(s): PROBNP in the last 8760 hours. HbA1C: No results for input(s): HGBA1C in the last 72 hours. CBG: Recent Labs  Lab 05/24/2021 1638 06/11/2021 2053 05/23/2021 0734  GLUCAP 102* 122* 92   Lipid Profile: Recent Labs    05/30/2021 0450  CHOL 138  HDL 40*  LDLCALC 82  TRIG 82  CHOLHDL 3.5   Thyroid Function Tests: Recent Labs    06/03/2021 0450  TSH 3.981   Anemia Panel: No results for input(s): VITAMINB12, FOLATE, FERRITIN, TIBC, IRON, RETICCTPCT in the last 72 hours. Sepsis Labs: No results for input(s): PROCALCITON, LATICACIDVEN in the last 168 hours.  Recent Results (from the past 240 hour(s))  Resp Panel by RT-PCR (Flu A&B, Covid) Nasopharyngeal Swab     Status: None   Collection Time: 06/18/2021 11:56 AM   Specimen: Nasopharyngeal Swab; Nasopharyngeal(NP) swabs in vial transport medium  Result Value Ref Range Status   SARS Coronavirus 2 by RT PCR NEGATIVE NEGATIVE Final    Comment: (NOTE) SARS-CoV-2 target nucleic acids are NOT DETECTED.  The SARS-CoV-2 RNA is generally detectable in upper respiratory specimens during the acute phase of infection. The lowest concentration of SARS-CoV-2 viral copies this assay can detect is 138 copies/mL. A negative result does not preclude SARS-Cov-2 infection and should not be used as the sole basis for treatment or other  patient management decisions. A negative result may occur with  improper specimen collection/handling, submission of specimen other than nasopharyngeal swab, presence of viral mutation(s) within the areas targeted by this assay, and inadequate number of viral copies(<138 copies/mL). A negative result must be combined with clinical observations, patient history, and epidemiological information. The expected result is Negative.  Fact Sheet for Patients:  EntrepreneurPulse.com.au  Fact Sheet for Healthcare Providers:  IncredibleEmployment.be  This test is no t yet approved or cleared by the [place] FDA and  has been authorized for detection and/or diagnosis of SARS-CoV-2 by FDA under an Emergency Use Authorization (EUA). This EUA will remain  in effect (meaning this test can be used) for the duration of the COVID-19 declaration under Section 564(b)(1) of the Act, 21 U.S.C.section 360bbb-3(b)(1), unless the authorization is terminated  or revoked sooner.       Influenza A by PCR NEGATIVE NEGATIVE Final   Influenza B by PCR NEGATIVE NEGATIVE Final    Comment: (  NOTE) The Xpert Xpress SARS-CoV-2/FLU/RSV plus assay is intended as an aid in the diagnosis of influenza from Nasopharyngeal swab specimens and should not be used as a sole basis for treatment. Nasal washings and aspirates are unacceptable for Xpert Xpress SARS-CoV-2/FLU/RSV testing.  Fact Sheet for Patients: EntrepreneurPulse.com.au  Fact Sheet for Healthcare Providers: IncredibleEmployment.be  This test is not yet approved or cleared by the [place] FDA and has been authorized for detection and/or diagnosis of SARS-CoV-2 by FDA under an Emergency Use Authorization (EUA). This EUA will remain in effect (meaning this test can be used) for the duration of the COVID-19 declaration under Section 564(b)(1) of the Act, 21 U.S.C. section  360bbb-3(b)(1), unless the authorization is terminated or revoked.  Performed at [facility], [address], [place], [place] [address]          Radiology Studies: DG Chest 1 View  Result Date: 05/29/2021 CLINICAL DATA:  Chest pain since early this morning, hypertension, smoker EXAM: CHEST  1 VIEW COMPARISON:  Portable exam 0938 hours compared to 07/21/2017 FINDINGS: VP shunt tubing traverses RIGHT hemithorax. Borderline enlargement of cardiac silhouette. Mediastinal contours and pulmonary vascularity normal. Minimal subsegmental atelectasis RIGHT upper lobe. Lungs otherwise clear. No pulmonary infiltrate, pleural effusion, or pneumothorax. Bones demineralized. IMPRESSION: Minimal subsegmental atelectasis RIGHT upper lobe. Electronically Signed   By: Lavonia Dana M.D.   On: 05/27/2021 09:52   ECHOCARDIOGRAM COMPLETE  Result Date: 06/18/2021    ECHOCARDIOGRAM REPORT   Patient Name:   JAYLANIE BOSCHEE Date of Exam: 05/24/2021 Medical Rec #:  [id number]       Height:       63.0 in Accession #:    [id number]      Weight:       192.0 lb Date of Birth:  November 09, 1961        BSA:          1.900 m Patient Age:    75 years        BP:           107/85 mmHg Patient Gender: F               HR:           47 bpm. Exam Location:  [facility] Procedure: 2D Echo, Cardiac Doppler and Color Doppler Indications:    Chest Pain R07.9  History:        Patient has prior history of Echocardiogram examinations, most                 recent 01/14/2016. Stroke, Arrythmias:Atrial Fibrillation and                 Bradycardia; Risk Factors:Hypertension and Current Smoker. WPW                 (Wolff-Parkinson-White syndrome) (From Hx).  Sonographer:    Alvino Chapel RCS Referring Phys: [id number] [place]  1. Left ventricular ejection fraction, by estimation, is 60 to 65%. The left ventricle has normal function. The left ventricle has no regional wall motion abnormalities. There is mild left ventricular hypertrophy. Left  ventricular diastolic parameters are indeterminate. Elevated left atrial pressure.  2. Right ventricular systolic function is normal. The right ventricular size is normal. There is mildly elevated pulmonary artery systolic pressure.  3. The mitral valve is normal in structure. Mild mitral valve regurgitation. No evidence of mitral stenosis.  4. The aortic valve is tricuspid. There is mild calcification of the aortic  valve. There is mild thickening of the aortic valve. Aortic valve regurgitation is not visualized. No aortic stenosis is present.  5. Mild pulmonary HTN, PASP is 36 mmHg.  6. The inferior vena cava is normal in size with greater than 50% respiratory variability, suggesting right atrial pressure of 3 mmHg. FINDINGS  Left Ventricle: Left ventricular ejection fraction, by estimation, is 60 to 65%. The left ventricle has normal function. The left ventricle has no regional wall motion abnormalities. The left ventricular internal cavity size was normal in size. There is  mild left ventricular hypertrophy. Left ventricular diastolic parameters are indeterminate. Elevated left atrial pressure. Right Ventricle: The right ventricular size is normal. Right vetricular wall thickness was not assessed. Right ventricular systolic function is normal. There is mildly elevated pulmonary artery systolic pressure. The tricuspid regurgitant velocity is 2.86 m/s, and with an assumed right atrial pressure of 8 mmHg, the estimated right ventricular systolic pressure is 69.6 mmHg. Left Atrium: Left atrial size was normal in size. Right Atrium: Right atrial size was normal in size. Pericardium: There is no evidence of pericardial effusion. Mitral Valve: The mitral valve is normal in structure. There is mild thickening of the mitral valve leaflet(s). There is mild calcification of the mitral valve leaflet(s). Mild mitral annular calcification. Mild mitral valve regurgitation. No evidence of  mitral valve stenosis. Tricuspid Valve:  The tricuspid valve is normal in structure. Tricuspid valve regurgitation is mild . No evidence of tricuspid stenosis. Aortic Valve: The aortic valve is tricuspid. There is mild calcification of the aortic valve. There is mild thickening of the aortic valve. There is mild aortic valve annular calcification. Aortic valve regurgitation is not visualized. No aortic stenosis  is present. Pulmonic Valve: The pulmonic valve was not well visualized. Pulmonic valve regurgitation is not visualized. No evidence of pulmonic stenosis. Aorta: The aortic root is normal in size and structure. Pulmonary Artery: Mild pulmonary HTN, PASP is 36 mmHg. Venous: The inferior vena cava is normal in size with greater than 50% respiratory variability, suggesting right atrial pressure of 3 mmHg. IAS/Shunts: No atrial level shunt detected by color flow Doppler.  LEFT VENTRICLE PLAX 2D LVIDd:         4.40 cm  Diastology LVIDs:         2.50 cm  LV e' medial:    6.53 cm/s LV PW:         1.00 cm  LV E/e' medial:  15.9 LV IVS:        1.20 cm  LV e' lateral:   6.31 cm/s LVOT diam:     1.80 cm  LV E/e' lateral: 16.5 LV SV:         61 LV SV Index:   32 LVOT Area:     2.54 cm  RIGHT VENTRICLE RV S prime:     11.60 cm/s TAPSE (M-mode): 2.2 cm LEFT ATRIUM             Index       RIGHT ATRIUM           Index LA diam:        4.30 cm 2.26 cm/m  RA Area:     15.90 cm LA Vol (A2C):   41.4 ml 21.79 ml/m RA Volume:   43.05 ml  22.65 ml/m LA Vol (A4C):   46.9 ml 24.68 ml/m LA Biplane Vol: 44.2 ml 23.26 ml/m  AORTIC VALVE LVOT Vmax:   108.00 cm/s LVOT Vmean:  66.100 cm/s LVOT VTI:  0.239 m  AORTA Ao Root diam: 2.90 cm MITRAL VALVE                TRICUSPID VALVE MV Area (PHT): 3.53 cm     TR Peak grad:   32.7 mmHg MV Decel Time: 215 msec     TR Vmax:        286.00 cm/s MV E velocity: 104.00 cm/s MV A velocity: 82.50 cm/s   SHUNTS MV E/A ratio:  1.26         Systemic VTI:  0.24 m                             Systemic Diam: 1.80 cm Carlyle Dolly MD  Electronically signed by Carlyle Dolly MD Signature Date/Time: 05/27/2021/6:15:59 PM    Final         Scheduled Meds:  amLODipine  5 mg Oral QPM   aspirin EC  81 mg Oral Daily   insulin aspart  0-5 Units Subcutaneous QHS   insulin aspart  0-9 Units Subcutaneous TID WC   levothyroxine  88 mcg Oral Daily   nitroGLYCERIN  1 inch Topical Q6H   rosuvastatin  40 mg Oral q1800   sodium chloride flush  3 mL Intravenous Q12H   sodium chloride flush  3 mL Intravenous Q12H   Continuous Infusions:  sodium chloride     sodium chloride 1 mL/kg/hr (06/03/2021 1037)   heparin 650 Units/hr (06/16/2021 0953)     LOS: 0 days    Time spent: 35 minutes    Chalee Hirota Darleen Crocker, DO [facility]  If 7PM-7AM, please contact night-coverage [email or web address] 06/06/2021, 11:03 AM

## 2021-06-05 NOTE — Progress Notes (Addendum)
ANTICOAGULATION CONSULT NOTE -   Pharmacy Consult for Heparin Indication: chest pain/ACS  No Known Allergies  Patient Measurements: Height: 5\' 3"  (160 cm) Weight: 87.1 kg (192 lb) IBW/kg (Calculated) : 52.4 HEPARIN DW (KG): 72   Vital Signs: Temp: 98.3 F (36.8 C) (06/15 0445) Temp Source: Oral (06/15 0445) BP: 126/66 (06/15 0445) Pulse Rate: 55 (06/15 0445)  Labs: Recent Labs    06/14/2021 1003 06/19/2021 1550 05/22/2021 2033 06/09/2021 2234 06/06/2021 0006 06/01/2021 0450 06/06/2021 0851  HGB 13.2  --   --   --   --  12.2  --   HCT 42.2  --   --   --   --  38.8  --   PLT 230  --   --   --   --  232  --   HEPARINUNFRC  --   --   --   --  0.97*  --  0.95*  CREATININE 1.67*  --   --   --   --  1.50*  --   TROPONINIHS 44*   < > 1,070* 1,455* 1,903*  --   --    < > = values in this interval not displayed.     Estimated Creatinine Clearance: 41.7 mL/min (A) (by C-G formula based on SCr of 1.5 mg/dL (H)).   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 Cristopher Peru)   Thyroid disease    PROCEDURE FOR THYROID 15 YRS AGO AT [facility]   WPW (Wolff-Parkinson-White syndrome)     Medications:  Medications Prior to Admission  Medication Sig Dispense Refill Last Dose   acetaminophen (TYLENOL) 500 MG tablet Take 1,000 mg by mouth daily as needed for moderate pain or headache.      amLODipine (NORVASC) 5 MG tablet Take 5 mg by mouth every evening.    06/03/2021   JARDIANCE 10 MG TABS tablet Take 10 mg by mouth daily.   06/03/2021   levothyroxine (SYNTHROID) 88 MCG tablet Take 88 mcg by mouth daily.   06/03/2021   potassium chloride (KLOR-CON) 10 MEQ tablet Take 10 mEq by mouth daily.   06/03/2021   rosuvastatin (CRESTOR) 40 MG tablet Take 40 mg by mouth daily.   06/03/2021   valsartan-hydrochlorothiazide  (DIOVAN-HCT) 160-25 MG tablet Take 1 tablet by mouth daily.   06/03/2021   Vitamin D, Ergocalciferol, (DRISDOL) 1.25 MG (50000 UNIT) CAPS capsule Take 1 capsule by mouth once a week.   Past Week   cyclobenzaprine (FLEXERIL) 10 MG tablet TAKE 1 TABLET BY MOUTH TWICE DAILY AS NEEDED FOR MUSCLE SPASMS (Patient not taking: No sig reported) 40 tablet 0 Not Taking   cyclobenzaprine (FLEXERIL) 10 MG tablet TAKE 1 TABLET BY MOUTH TWICE DAILY AS NEEDED FOR MUSCLE SPASMS (Patient not taking: No sig reported) 40 tablet 0 Not Taking   cyclobenzaprine (FLEXERIL) 10 MG tablet TAKE 1 TABLET BY MOUTH TWICE DAILY AS NEEDED FOR MUSCLE SPASMS (Patient not taking: No sig reported) 40 tablet 0 Not Taking   diazepam (VALIUM) 5 MG tablet Take 1 tablet (5 mg total) by mouth 2 (two) times daily. (Patient not taking: No sig reported) 10 tablet 0 Not Taking   gabapentin (NEURONTIN) 100 MG capsule TAKE 1 CAPSULE BY MOUTH THREE TIMES DAILY (Patient not taking: No sig  reported) 90 capsule 2 Not Taking   HYDROcodone-acetaminophen (NORCO/VICODIN) 5-325 MG tablet Take 1 tablet by mouth every 4 (four) hours as needed. (Patient not taking: No sig reported) 10 tablet 0 Not Taking   naproxen (NAPROSYN) 500 MG tablet TAKE 1 TABLET BY MOUTH TWICE DAILY WITH A MEAL (Patient not taking: No sig reported) 60 tablet 2 Not Taking   VOLTAREN 1 % GEL Apply 2 g topically 4 (four) times daily as needed (pain).  (Patient not taking: Reported on 05/28/2021)  5 Not Taking    Assessment: Patient presented to ED with chest pain. Patient has elevated troponins. Reviewed home meds and not on oral anticoagulants. Pharmacy asked to start heparin. Plan for lesxiscan vs. Cardiac cath.   HL 0.95- supratherapeutic  Goal of Therapy:  Heparin level 0.3-0.7 units/ml Monitor platelets by anticoagulation protocol: Yes   Plan:  Decrease heparin infusion to 650 units/hr. Heparin level in 6-8 hours and daily. Monitor H&H and s/s of bleeding.  Margot Ables,  PharmD Clinical Pharmacist 05/26/2021 9:42 AM

## 2021-06-06 ENCOUNTER — Inpatient Hospital Stay (HOSPITAL_COMMUNITY): Payer: 59

## 2021-06-06 ENCOUNTER — Encounter (HOSPITAL_COMMUNITY): Payer: Self-pay | Admitting: Interventional Cardiology

## 2021-06-06 DIAGNOSIS — T81718A Complication of other artery following a procedure, not elsewhere classified, initial encounter: Secondary | ICD-10-CM

## 2021-06-06 DIAGNOSIS — R570 Cardiogenic shock: Secondary | ICD-10-CM | POA: Diagnosis not present

## 2021-06-06 DIAGNOSIS — I214 Non-ST elevation (NSTEMI) myocardial infarction: Secondary | ICD-10-CM | POA: Diagnosis not present

## 2021-06-06 DIAGNOSIS — I2510 Atherosclerotic heart disease of native coronary artery without angina pectoris: Secondary | ICD-10-CM | POA: Diagnosis not present

## 2021-06-06 DIAGNOSIS — I469 Cardiac arrest, cause unspecified: Secondary | ICD-10-CM

## 2021-06-06 DIAGNOSIS — Y840 Cardiac catheterization as the cause of abnormal reaction of the patient, or of later complication, without mention of misadventure at the time of the procedure: Secondary | ICD-10-CM

## 2021-06-06 DIAGNOSIS — Z951 Presence of aortocoronary bypass graft: Secondary | ICD-10-CM

## 2021-06-06 DIAGNOSIS — R079 Chest pain, unspecified: Secondary | ICD-10-CM | POA: Diagnosis not present

## 2021-06-06 LAB — HEPATIC FUNCTION PANEL
ALT: 221 U/L — ABNORMAL HIGH (ref 0–44)
AST: 771 U/L — ABNORMAL HIGH (ref 15–41)
Albumin: 2.6 g/dL — ABNORMAL LOW (ref 3.5–5.0)
Alkaline Phosphatase: 26 U/L — ABNORMAL LOW (ref 38–126)
Bilirubin, Direct: 0.3 mg/dL — ABNORMAL HIGH (ref 0.0–0.2)
Indirect Bilirubin: 0.4 mg/dL (ref 0.3–0.9)
Total Bilirubin: 0.7 mg/dL (ref 0.3–1.2)
Total Protein: 3.8 g/dL — ABNORMAL LOW (ref 6.5–8.1)

## 2021-06-06 LAB — POCT I-STAT 7, (LYTES, BLD GAS, ICA,H+H)
Acid-base deficit: 2 mmol/L (ref 0.0–2.0)
Acid-base deficit: 2 mmol/L (ref 0.0–2.0)
Acid-base deficit: 6 mmol/L — ABNORMAL HIGH (ref 0.0–2.0)
Acid-base deficit: 6 mmol/L — ABNORMAL HIGH (ref 0.0–2.0)
Acid-base deficit: 8 mmol/L — ABNORMAL HIGH (ref 0.0–2.0)
Acid-base deficit: 8 mmol/L — ABNORMAL HIGH (ref 0.0–2.0)
Bicarbonate: 18.2 mmol/L — ABNORMAL LOW (ref 20.0–28.0)
Bicarbonate: 18.4 mmol/L — ABNORMAL LOW (ref 20.0–28.0)
Bicarbonate: 18.4 mmol/L — ABNORMAL LOW (ref 20.0–28.0)
Bicarbonate: 19.2 mmol/L — ABNORMAL LOW (ref 20.0–28.0)
Bicarbonate: 22.5 mmol/L (ref 20.0–28.0)
Bicarbonate: 23.3 mmol/L (ref 20.0–28.0)
Calcium, Ion: 1.09 mmol/L — ABNORMAL LOW (ref 1.15–1.40)
Calcium, Ion: 1.09 mmol/L — ABNORMAL LOW (ref 1.15–1.40)
Calcium, Ion: 1.11 mmol/L — ABNORMAL LOW (ref 1.15–1.40)
Calcium, Ion: 1.12 mmol/L — ABNORMAL LOW (ref 1.15–1.40)
Calcium, Ion: 1.14 mmol/L — ABNORMAL LOW (ref 1.15–1.40)
Calcium, Ion: 1.14 mmol/L — ABNORMAL LOW (ref 1.15–1.40)
HCT: 20 % — ABNORMAL LOW (ref 36.0–46.0)
HCT: 26 % — ABNORMAL LOW (ref 36.0–46.0)
HCT: 27 % — ABNORMAL LOW (ref 36.0–46.0)
HCT: 31 % — ABNORMAL LOW (ref 36.0–46.0)
HCT: 32 % — ABNORMAL LOW (ref 36.0–46.0)
HCT: 34 % — ABNORMAL LOW (ref 36.0–46.0)
Hemoglobin: 10.5 g/dL — ABNORMAL LOW (ref 12.0–15.0)
Hemoglobin: 10.9 g/dL — ABNORMAL LOW (ref 12.0–15.0)
Hemoglobin: 11.6 g/dL — ABNORMAL LOW (ref 12.0–15.0)
Hemoglobin: 6.8 g/dL — CL (ref 12.0–15.0)
Hemoglobin: 8.8 g/dL — ABNORMAL LOW (ref 12.0–15.0)
Hemoglobin: 9.2 g/dL — ABNORMAL LOW (ref 12.0–15.0)
O2 Saturation: 100 %
O2 Saturation: 100 %
O2 Saturation: 100 %
O2 Saturation: 100 %
O2 Saturation: 100 %
O2 Saturation: 100 %
Patient temperature: 36.4
Patient temperature: 36.4
Patient temperature: 36.4
Patient temperature: 36.5
Potassium: 3 mmol/L — ABNORMAL LOW (ref 3.5–5.1)
Potassium: 3.3 mmol/L — ABNORMAL LOW (ref 3.5–5.1)
Potassium: 3.4 mmol/L — ABNORMAL LOW (ref 3.5–5.1)
Potassium: 3.5 mmol/L (ref 3.5–5.1)
Potassium: 3.7 mmol/L (ref 3.5–5.1)
Potassium: 3.8 mmol/L (ref 3.5–5.1)
Sodium: 142 mmol/L (ref 135–145)
Sodium: 144 mmol/L (ref 135–145)
Sodium: 144 mmol/L (ref 135–145)
Sodium: 144 mmol/L (ref 135–145)
Sodium: 146 mmol/L — ABNORMAL HIGH (ref 135–145)
Sodium: 146 mmol/L — ABNORMAL HIGH (ref 135–145)
TCO2: 19 mmol/L — ABNORMAL LOW (ref 22–32)
TCO2: 20 mmol/L — ABNORMAL LOW (ref 22–32)
TCO2: 20 mmol/L — ABNORMAL LOW (ref 22–32)
TCO2: 20 mmol/L — ABNORMAL LOW (ref 22–32)
TCO2: 24 mmol/L (ref 22–32)
TCO2: 25 mmol/L (ref 22–32)
pCO2 arterial: 29.4 mmHg — ABNORMAL LOW (ref 32.0–48.0)
pCO2 arterial: 34.1 mmHg (ref 32.0–48.0)
pCO2 arterial: 35.8 mmHg (ref 32.0–48.0)
pCO2 arterial: 38 mmHg (ref 32.0–48.0)
pCO2 arterial: 39.5 mmHg (ref 32.0–48.0)
pCO2 arterial: 39.5 mmHg (ref 32.0–48.0)
pH, Arterial: 7.274 — ABNORMAL LOW (ref 7.350–7.450)
pH, Arterial: 7.292 — ABNORMAL LOW (ref 7.350–7.450)
pH, Arterial: 7.335 — ABNORMAL LOW (ref 7.350–7.450)
pH, Arterial: 7.379 (ref 7.350–7.450)
pH, Arterial: 7.399 (ref 7.350–7.450)
pH, Arterial: 7.426 (ref 7.350–7.450)
pO2, Arterial: 385 mmHg — ABNORMAL HIGH (ref 83.0–108.0)
pO2, Arterial: 395 mmHg — ABNORMAL HIGH (ref 83.0–108.0)
pO2, Arterial: 429 mmHg — ABNORMAL HIGH (ref 83.0–108.0)
pO2, Arterial: 443 mmHg — ABNORMAL HIGH (ref 83.0–108.0)
pO2, Arterial: 448 mmHg — ABNORMAL HIGH (ref 83.0–108.0)
pO2, Arterial: 458 mmHg — ABNORMAL HIGH (ref 83.0–108.0)

## 2021-06-06 LAB — CBC
HCT: 33.7 % — ABNORMAL LOW (ref 36.0–46.0)
HCT: 23.9 % — ABNORMAL LOW (ref 36.0–46.0)
Hemoglobin: 10.9 g/dL — ABNORMAL LOW (ref 12.0–15.0)
Hemoglobin: 7.8 g/dL — ABNORMAL LOW (ref 12.0–15.0)
MCH: 28.5 pg (ref 26.0–34.0)
MCH: 28.3 pg (ref 26.0–34.0)
MCHC: 32.3 g/dL (ref 30.0–36.0)
MCHC: 32.6 g/dL (ref 30.0–36.0)
MCV: 88 fL (ref 80.0–100.0)
MCV: 86.6 fL (ref 80.0–100.0)
Platelets: 99 10*3/uL — ABNORMAL LOW (ref 150–400)
Platelets: 67 10*3/uL — ABNORMAL LOW (ref 150–400)
RBC: 3.83 MIL/uL — ABNORMAL LOW (ref 3.87–5.11)
RBC: 2.76 MIL/uL — ABNORMAL LOW (ref 3.87–5.11)
RDW: 16.8 % — ABNORMAL HIGH (ref 11.5–15.5)
RDW: 16.7 % — ABNORMAL HIGH (ref 11.5–15.5)
WBC: 9.9 10*3/uL (ref 4.0–10.5)
WBC: 8.2 10*3/uL (ref 4.0–10.5)
nRBC: 0 % (ref 0.0–0.2)
nRBC: 0 % (ref 0.0–0.2)

## 2021-06-06 LAB — BPAM PLATELET PHERESIS
Blood Product Expiration Date: 202206152359
Blood Product Expiration Date: 202206162359
Blood Product Expiration Date: 202206172359
ISSUE DATE / TIME: 202206151835
ISSUE DATE / TIME: 202206151843
ISSUE DATE / TIME: 202206151956
Unit Type and Rh: 2800
Unit Type and Rh: 6200
Unit Type and Rh: 6200

## 2021-06-06 LAB — BPAM CRYOPRECIPITATE
Blood Product Expiration Date: 202206160025
Blood Product Expiration Date: 202206160025
Blood Product Expiration Date: 202206160025
ISSUE DATE / TIME: 202206151846
ISSUE DATE / TIME: 202206151846
ISSUE DATE / TIME: 202206151846
Unit Type and Rh: 5100
Unit Type and Rh: 5100
Unit Type and Rh: 5100

## 2021-06-06 LAB — MRSA PCR SCREENING: MRSA by PCR: NEGATIVE

## 2021-06-06 LAB — BASIC METABOLIC PANEL
Anion gap: 10 (ref 5–15)
Anion gap: 8 (ref 5–15)
BUN: 14 mg/dL (ref 6–20)
BUN: 14 mg/dL (ref 6–20)
CO2: 17 mmol/L — ABNORMAL LOW (ref 22–32)
CO2: 20 mmol/L — ABNORMAL LOW (ref 22–32)
Calcium: 7.3 mg/dL — ABNORMAL LOW (ref 8.9–10.3)
Calcium: 7.4 mg/dL — ABNORMAL LOW (ref 8.9–10.3)
Chloride: 112 mmol/L — ABNORMAL HIGH (ref 98–111)
Chloride: 113 mmol/L — ABNORMAL HIGH (ref 98–111)
Creatinine, Ser: 1.91 mg/dL — ABNORMAL HIGH (ref 0.44–1.00)
Creatinine, Ser: 1.9 mg/dL — ABNORMAL HIGH (ref 0.44–1.00)
GFR, Estimated: 30 mL/min — ABNORMAL LOW (ref >60)
GFR, Estimated: 30 mL/min — ABNORMAL LOW (ref >60)
Glucose, Bld: 251 mg/dL — ABNORMAL HIGH (ref 70–99)
Glucose, Bld: 178 mg/dL — ABNORMAL HIGH (ref 70–99)
Potassium: 3.4 mmol/L — ABNORMAL LOW (ref 3.5–5.1)
Potassium: 3.7 mmol/L (ref 3.5–5.1)
Sodium: 139 mmol/L (ref 135–145)
Sodium: 141 mmol/L (ref 135–145)

## 2021-06-06 LAB — PROTIME-INR
INR: 1.7 — ABNORMAL HIGH (ref 0.8–1.2)
Prothrombin Time: 19.6 seconds — ABNORMAL HIGH (ref 11.4–15.2)

## 2021-06-06 LAB — GLUCOSE, CAPILLARY
Glucose-Capillary: 114 mg/dL — ABNORMAL HIGH (ref 70–99)
Glucose-Capillary: 118 mg/dL — ABNORMAL HIGH (ref 70–99)
Glucose-Capillary: 136 mg/dL — ABNORMAL HIGH (ref 70–99)
Glucose-Capillary: 141 mg/dL — ABNORMAL HIGH (ref 70–99)
Glucose-Capillary: 144 mg/dL — ABNORMAL HIGH (ref 70–99)
Glucose-Capillary: 147 mg/dL — ABNORMAL HIGH (ref 70–99)
Glucose-Capillary: 151 mg/dL — ABNORMAL HIGH (ref 70–99)
Glucose-Capillary: 153 mg/dL — ABNORMAL HIGH (ref 70–99)
Glucose-Capillary: 165 mg/dL — ABNORMAL HIGH (ref 70–99)
Glucose-Capillary: 169 mg/dL — ABNORMAL HIGH (ref 70–99)
Glucose-Capillary: 191 mg/dL — ABNORMAL HIGH (ref 70–99)
Glucose-Capillary: 191 mg/dL — ABNORMAL HIGH (ref 70–99)
Glucose-Capillary: 201 mg/dL — ABNORMAL HIGH (ref 70–99)
Glucose-Capillary: 203 mg/dL — ABNORMAL HIGH (ref 70–99)
Glucose-Capillary: 204 mg/dL — ABNORMAL HIGH (ref 70–99)
Glucose-Capillary: 208 mg/dL — ABNORMAL HIGH (ref 70–99)
Glucose-Capillary: 216 mg/dL — ABNORMAL HIGH (ref 70–99)
Glucose-Capillary: 217 mg/dL — ABNORMAL HIGH (ref 70–99)
Glucose-Capillary: 88 mg/dL (ref 70–99)

## 2021-06-06 LAB — APTT
aPTT: >200 seconds (ref 24–36)
aPTT: >200 seconds (ref 24–36)
aPTT: >200 seconds (ref 24–36)

## 2021-06-06 LAB — PREPARE CRYOPRECIPITATE
Unit division: 0
Unit division: 0
Unit division: 0

## 2021-06-06 LAB — HEMOGLOBIN AND HEMATOCRIT, BLOOD
HCT: 29.9 % — ABNORMAL LOW (ref 36.0–46.0)
Hemoglobin: 9.7 g/dL — ABNORMAL LOW (ref 12.0–15.0)

## 2021-06-06 LAB — PREPARE PLATELET PHERESIS
Unit division: 0
Unit division: 0
Unit division: 0

## 2021-06-06 LAB — ECHO INTRAOPERATIVE TEE
Height: 63 in
Weight: 3156.99 oz

## 2021-06-06 LAB — FIBRINOGEN: Fibrinogen: 220 mg/dL (ref 210–475)

## 2021-06-06 LAB — MAGNESIUM
Magnesium: 2.6 mg/dL — ABNORMAL HIGH (ref 1.7–2.4)
Magnesium: 2 mg/dL (ref 1.7–2.4)

## 2021-06-06 LAB — HEPARIN LEVEL (UNFRACTIONATED)
Heparin Unfractionated: >1.1 IU/mL — ABNORMAL HIGH (ref 0.30–0.70)
Heparin Unfractionated: >1.1 IU/mL — ABNORMAL HIGH (ref 0.30–0.70)
Heparin Unfractionated: 0.77 IU/mL — ABNORMAL HIGH (ref 0.30–0.70)
Heparin Unfractionated: 0.32 IU/mL (ref 0.30–0.70)
Heparin Unfractionated: 0.12 IU/mL — ABNORMAL LOW (ref 0.30–0.70)

## 2021-06-06 LAB — LACTIC ACID, PLASMA: Lactic Acid, Venous: 5.1 mmol/L (ref 0.5–1.9)

## 2021-06-06 LAB — LACTATE DEHYDROGENASE: LDH: 1875 U/L — ABNORMAL HIGH (ref 98–192)

## 2021-06-06 LAB — PREPARE RBC (CROSSMATCH)

## 2021-06-06 LAB — HEMOGLOBIN A1C
Hgb A1c MFr Bld: 5.4 % (ref 4.8–5.6)
Mean Plasma Glucose: 108 mg/dL

## 2021-06-06 MED ORDER — POTASSIUM CHLORIDE 10 MEQ/50ML IV SOLN
10.0000 meq | INTRAVENOUS | Status: DC
  Administered 2021-06-06 (×2): 10 meq via INTRAVENOUS
  Filled 2021-06-06 (×2): qty 50

## 2021-06-06 MED ORDER — ROSUVASTATIN CALCIUM 20 MG PO TABS
40.0000 mg | ORAL_TABLET | Freq: Every day | ORAL | Status: DC
  Administered 2021-06-06 – 2021-06-15 (×9): 40 mg
  Filled 2021-06-06 (×10): qty 2

## 2021-06-06 MED ORDER — SODIUM CHLORIDE 0.9% IV SOLUTION: Freq: Once | INTRAVENOUS | Status: AC

## 2021-06-06 MED ORDER — DOCUSATE SODIUM 50 MG/5ML PO LIQD
200.0000 mg | Freq: Every day | ORAL | Status: DC
  Administered 2021-06-06 – 2021-06-17 (×11): 200 mg
  Filled 2021-06-06 (×11): qty 20

## 2021-06-06 MED ORDER — PIVOT 1.5 CAL PO LIQD
1000.0000 mL | ORAL | Status: DC
  Administered 2021-06-06 – 2021-06-11 (×3): 1000 mL

## 2021-06-06 MED ORDER — NOREPINEPHRINE 16 MG/250ML-% IV SOLN
0.0000 ug/min | INTRAVENOUS | Status: DC
  Administered 2021-06-06: 5 ug/min via INTRAVENOUS
  Administered 2021-06-07: 9 ug/min via INTRAVENOUS
  Administered 2021-06-09: 3 ug/min via INTRAVENOUS
  Administered 2021-06-10: 7 ug/min via INTRAVENOUS
  Administered 2021-06-11: 19 ug/min via INTRAVENOUS
  Administered 2021-06-11: 37 ug/min via INTRAVENOUS
  Administered 2021-06-12: 30 ug/min via INTRAVENOUS
  Administered 2021-06-12: 28 ug/min via INTRAVENOUS
  Administered 2021-06-13: 46 ug/min via INTRAVENOUS
  Administered 2021-06-13: 35 ug/min via INTRAVENOUS
  Administered 2021-06-13: 47 ug/min via INTRAVENOUS
  Administered 2021-06-13: 40 ug/min via INTRAVENOUS
  Administered 2021-06-14: 48 ug/min via INTRAVENOUS
  Administered 2021-06-14: 41 ug/min via INTRAVENOUS
  Administered 2021-06-14: 30 ug/min via INTRAVENOUS
  Administered 2021-06-14: 45 ug/min via INTRAVENOUS
  Administered 2021-06-15: 24 ug/min via INTRAVENOUS
  Administered 2021-06-15: 22 ug/min via INTRAVENOUS
  Administered 2021-06-16: 10 ug/min via INTRAVENOUS
  Administered 2021-06-16: 28 ug/min via INTRAVENOUS
  Administered 2021-06-17: 40 ug/min via INTRAVENOUS
  Filled 2021-06-06 (×21): qty 250

## 2021-06-06 MED ORDER — ALBUMIN HUMAN 5 % IV SOLN
12.5000 g | INTRAVENOUS | Status: DC | PRN
  Administered 2021-06-06 – 2021-06-10 (×9): 12.5 g via INTRAVENOUS
  Filled 2021-06-06 (×9): qty 250

## 2021-06-06 MED ORDER — DEXTROSE 50 % IV SOLN: 0.0000 mL | INTRAVENOUS | Status: DC | PRN

## 2021-06-06 MED ORDER — HEPARIN (PORCINE) 25000 UT/250ML-% IV SOLN
300.0000 [IU]/h | INTRAVENOUS | Status: DC
  Administered 2021-06-06: 100 [IU]/h via INTRAVENOUS
  Administered 2021-06-08: 300 [IU]/h via INTRAVENOUS
  Filled 2021-06-06 (×2): qty 250

## 2021-06-06 MED ORDER — LEVOTHYROXINE SODIUM 88 MCG PO TABS: 88.0000 ug | ORAL_TABLET | Freq: Every day | ORAL | Status: DC

## 2021-06-06 MED ORDER — PANTOPRAZOLE SODIUM 40 MG PO PACK
40.0000 mg | PACK | Freq: Every day | ORAL | Status: DC
  Administered 2021-06-06 – 2021-06-17 (×11): 40 mg
  Filled 2021-06-06 (×13): qty 20

## 2021-06-06 MED ORDER — VITAL HIGH PROTEIN PO LIQD: 1000.0000 mL | ORAL | Status: DC

## 2021-06-06 MED ORDER — POTASSIUM CHLORIDE 20 MEQ PO PACK
40.0000 meq | PACK | Freq: Two times a day (BID) | ORAL | Status: AC
  Administered 2021-06-06 (×2): 40 meq via ORAL
  Filled 2021-06-06 (×2): qty 2

## 2021-06-06 MED ORDER — HEPARIN SODIUM (PORCINE) 5000 UNIT/ML IJ SOLN
INTRAVENOUS | Status: DC
  Administered 2021-06-11: 18.1 mL/h
  Filled 2021-06-06 (×8): qty 5

## 2021-06-06 MED ORDER — POTASSIUM CHLORIDE 20 MEQ PO PACK
40.0000 meq | PACK | Freq: Once | ORAL | Status: AC
  Administered 2021-06-06: 40 meq via ORAL
  Filled 2021-06-06: qty 2

## 2021-06-06 MED ORDER — FUROSEMIDE 10 MG/ML IJ SOLN
80.0000 mg | Freq: Once | INTRAMUSCULAR | Status: AC
  Administered 2021-06-06: 80 mg via INTRAVENOUS
  Filled 2021-06-06: qty 8

## 2021-06-06 MED ORDER — INSULIN REGULAR(HUMAN) IN NACL 100-0.9 UT/100ML-% IV SOLN
INTRAVENOUS | Status: DC
  Administered 2021-06-08: 3 [IU]/h via INTRAVENOUS
  Administered 2021-06-09: 1 [IU]/h via INTRAVENOUS
  Filled 2021-06-06 (×3): qty 100

## 2021-06-06 MED ORDER — SODIUM CHLORIDE 0.9 % IV SOLN
0.0000 ug/min | INTRAVENOUS | Status: DC
  Administered 2021-06-06: 3 ug/min via INTRAVENOUS
  Administered 2021-06-07: 6 ug/min via INTRAVENOUS
  Administered 2021-06-08: 7 ug/min via INTRAVENOUS
  Administered 2021-06-09: 6 ug/min via INTRAVENOUS
  Administered 2021-06-10: 7 ug/min via INTRAVENOUS
  Administered 2021-06-11 – 2021-06-14 (×3): 5 ug/min via INTRAVENOUS
  Administered 2021-06-15: 4 ug/min via INTRAVENOUS
  Filled 2021-06-06 (×10): qty 10

## 2021-06-06 MED FILL — Mannitol IV Soln 20%: INTRAVENOUS | Qty: 500 | Status: AC

## 2021-06-06 MED FILL — Heparin Sodium (Porcine) Inj 1000 Unit/ML: INTRAMUSCULAR | Qty: 30 | Status: AC

## 2021-06-06 MED FILL — Calcium Chloride Inj 10%: INTRAVENOUS | Qty: 10 | Status: AC

## 2021-06-06 MED FILL — Lidocaine HCl Local Soln Prefilled Syringe 100 MG/5ML (2%): INTRAMUSCULAR | Qty: 5 | Status: AC

## 2021-06-06 MED FILL — Electrolyte-R (PH 7.4) Solution: INTRAVENOUS | Qty: 7000 | Status: AC

## 2021-06-06 MED FILL — Sodium Bicarbonate IV Soln 8.4%: INTRAVENOUS | Qty: 150 | Status: AC

## 2021-06-06 NOTE — Progress Notes (Addendum)
[facility] for IV Heparin Indication:  ECMO and Impella  No Known Allergies  Patient Measurements: Height: 5\' 3"  (160 cm) Weight: 94.7 kg (208 lb 12.4 oz) IBW/kg (Calculated) : 52.4 Heparin Dosing Weight: 72 kg  Vital Signs: Temp: 97.3 F (36.3 C) (06/16 0700) Temp Source: Core (06/16 0400) BP: 85/67 (06/16 0700) Pulse Rate: 75 (06/16 0700)  Labs: Recent Labs    06/18/2021 2033 05/26/2021 2234 05/25/2021 0006 06/03/2021 0450 06/20/2021 1630 06/09/2021 1644 05/31/2021 1845 05/27/2021 1907 05/24/2021 2055 06/09/2021 2220 06/14/2021 2230 06/06/21 0012 06/06/21 0048 06/06/21 0345 06/06/21 0402 06/06/21 0510 06/06/21 0605  HGB  --   --   --    < > 8.2*   < > 8.5*   < > 12.2  12.3  --   --    < >  --  10.9* 10.9*  --  10.5*  HCT  --   --   --    < > 25.5*   < > 25.0*   < > 36.0  37.2  --   --    < >  --  32.0* 33.7*  --  31.0*  PLT  --   --   --    < > 120*  --   --   --  104*  --   --   --   --   --  99*  --   --   APTT  --   --   --   --   --   --   --   --   --   --  >200*  --   --   --  >200*  --   --   LABPROT  --   --   --   --   --   --   --   --   --   --  19.2*  --   --   --  19.6*  --   --   INR  --   --   --   --   --   --   --   --   --   --  1.6*  --   --   --  1.7*  --   --   HEPARINUNFRC  --   --  0.97*   < >  --   --   --   --   --  0.48  --   --  >1.10*  --   --  >1.10*  --   CREATININE  --   --   --    < >  --    < > 1.40*  --  1.76*  --   --   --   --   --  1.91*  --   --   TROPONINIHS 1,070* 1,455* 1,903*  --   --   --   --   --   --   --   --   --   --   --   --   --   --    < > = values in this interval not displayed.     Estimated Creatinine Clearance: 34.3 mL/min (A) (by C-G formula based on SCr of 1.91 mg/dL (H)).   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 Cristopher Peru)   Thyroid disease    PROCEDURE FOR THYROID 15 YRS AGO AT [facility]   WPW (Wolff-Parkinson-White syndrome)     Medications:  Infusions:   sodium chloride 20 mL/hr at 06/06/21 0700   sodium chloride     sodium chloride     sodium chloride     sodium chloride     sodium chloride Stopped (06/06/21 0645)   sodium chloride     albumin human Stopped (06/06/21 0642)   amiodarone 30 mg/hr (06/06/21 0700)    ceFAZolin (ANCEF) IV 200 mL/hr at 06/06/21 0700   dexmedetomidine (PRECEDEX) IV infusion 0.7 mcg/kg/hr (06/06/21 0700)   epinephrine     famotidine (PEPCID) IV Stopped (06/03/2021 2257)   heparin 25 units/mL (Impella PURGE) in dextrose 5 % 1000 mL bag     HYDROmorphone Stopped (06/06/21 0605)   insulin     lactated ringers     lactated ringers 20 mL/hr at 06/06/21 0700   lactated ringers     milrinone 0.25 mcg/kg/min (06/06/21 0700)   nitroGLYCERIN     norepinephrine (LEVOPHED) Adult infusion     potassium chloride      Assessment:  49 yoF admitted with cardiogenic shock 2/2 LM SCAD. Pt s/p CABG with VA-ECMO and Impella placement. Heparin started systemically and via purge (50 units/ml).   Heparin level is elevated >1.1 on purge solution alone. CBC and chest tube output relatively stable. Discussed with MD - will transition to half-strength purge concentration (25 units/ml) for now and add systemic heparin as needed.   Goal of Therapy:  Heparin level 0.3 to 0.5 units/ml Monitor platelets by anticoagulation protocol: Yes   Plan:  -Continue to hold systemic heparin -Adjust heparinized purge to half-strength (25 units/ml) - ~400 units/hr of heparin -Check heparin level in 6h   ADDENDUM: Heparin level rechecked ~2h after switching purge concentration as above - remains elevated at 0.77 but is trending down. Will not make changes, and check heparin level 6h after new purge was started  ~1530.   [name], PharmD, BCPS, [facility] Clinical Pharmacist [phone] Please check AMION for all [place] numbers 06/06/2021

## 2021-06-06 NOTE — Progress Notes (Signed)
ECMO NOTE:   Indication: Cardiogenic shock    Initial cannulation date: 05/25/2021   ECMO type: VA ECMO   Cannulation:   Central cannulation Asc Ao & RA   ECMO events:   - Initial cannulation 05/31/2021   Daily data:   Flow 3.7L RPM 3000 Sweep  4.0 L   Labs:   ABG    Component Value Date/Time   PHART 7.399 06/06/2021 0947   PCO2ART 29.4 (L) 06/06/2021 0947   PO2ART 458 (H) 06/06/2021 0947   HCO3 18.2 (L) 06/06/2021 0947   TCO2 19 (L) 06/06/2021 0947   ACIDBASEDEF 6.0 (H) 06/06/2021 0947   O2SAT 100.0 06/06/2021 0947    Hgb 10.9 Platelets 104 -> 99 LDH  1876 Heparin level: 1.1 Lactic acid 7.5 -> 5.1   Plan:  - Continue VA ECMO with Impella vent at P-1 (waveforms ok) - Titrate gtts to MAP 70-90 - Adjust heparin with HL goal 0.3-0.5  Glori Bickers, MD  1:37 PM

## 2021-06-06 NOTE — Progress Notes (Signed)
Pt was having issues with pacemaker pacing appropriately. Pt was in DDD and had pacer spikes capturing in the middle of the QRS segments. Pt intrinsic heart rate was seen underneath the pacemaker and therefore to prevent arrhythmias, RN changed pacemaker settings to VVI back up rate of 60.

## 2021-06-06 NOTE — Progress Notes (Signed)
1 Day Post-Op Procedure(s) (LRB): CORONARY ARTERY BYPASS GRAFTING (CABG)X2.USING  LEFT INTERNAL MAMMARY ARTERY AND  RIGHT ENDOSCOPIC SAPHENOUS VEIN HARVESTING. ECMO INSERTION (N/A) TRANSESOPHAGEAL ECHOCARDIOGRAM (TEE) (N/A) Subjective: Intubated, sedated  Objective: Vital signs in last 24 hours: Temp:  [97.2 F (36.2 C)-97.5 F (36.4 C)] 97.3 F (36.3 C) (06/16 0700) Pulse Rate:  [0-173] 75 (06/16 0700) Cardiac Rhythm: Ventricular paced (06/16 0400) Resp:  [0-26] 10 (06/16 0700) BP: (78-147)/(53-109) 85/67 (06/16 0700) SpO2:  [0 %-100 %] 100 % (06/16 0700) Arterial Line BP: (69-81)/(66-74) 80/72 (06/16 0700) FiO2 (%):  [30 %-40 %] 30 % (06/16 0400) Weight:  [89.5 kg-94.7 kg] 94.7 kg (06/16 0500)  Hemodynamic parameters for last 24 hours: PAP: (13-25)/(10-16) 22/15  Intake/Output from previous day: 06/15 0701 - 06/16 0700 In: 7582.9 [I.V.:4212.1; Blood:1190; IV Piggyback:2021.3] Out: 4742 [Urine:2575; Blood:1200; Chest Tube:260] Intake/Output this shift: No intake/output data recorded.  General appearance: sedated Heart: regular rate and rhythm Lungs: clear anteriorly Abdomen: nondistended Extremities: warm Wound: esmark in place  Lab Results: Recent Labs    06/18/2021 2055 06/06/21 0012 06/06/21 0402 06/06/21 0605  WBC 11.3*  --  9.9  --   HGB 12.2  12.3   < > 10.9* 10.5*  HCT 36.0  37.2   < > 33.7* 31.0*  PLT 104*  --  99*  --    < > = values in this interval not displayed.   BMET:  Recent Labs    05/23/2021 2055 06/06/21 0012 06/06/21 0402 06/06/21 0605  NA 144  142   < > 139 144  K 3.1*  2.9*   < > 3.4* 3.4*  CL 112*  --  112*  --   CO2 17*  --  17*  --   GLUCOSE 207*  --  251*  --   BUN 13  --  14  --   CREATININE 1.76*  --  1.91*  --   CALCIUM 7.4*  --  7.3*  --    < > = values in this interval not displayed.    PT/INR:  Recent Labs    06/06/21 0402  LABPROT 19.6*  INR 1.7*   ABG    Component Value Date/Time   PHART 7.335 (L)  06/06/2021 0605   HCO3 19.2 (L) 06/06/2021 0605   TCO2 20 (L) 06/06/2021 0605   ACIDBASEDEF 6.0 (H) 06/06/2021 0605   O2SAT 100.0 06/06/2021 0605   CBG (last 3)  Recent Labs    06/06/21 0459 06/06/21 0602 06/06/21 0655  GLUCAP 203* 201* 208*    Assessment/Plan: S/P Procedure(s) (LRB): CORONARY ARTERY BYPASS GRAFTING (CABG)X2.USING  LEFT INTERNAL MAMMARY ARTERY AND  RIGHT ENDOSCOPIC SAPHENOUS VEIN HARVESTING. ECMO INSERTION (N/A) TRANSESOPHAGEAL ECHOCARDIOGRAM (TEE) (N/A) - Remains critically ill NEURO- sedated CV- on ECMO with flows 3.8 L, Impella at P1 with 1 L flow  Milrinone 0.25, epi 5 ` Amiodarone started in OR for VF prior to initiation of bypass ECMO- good flows, no issues with circuit, heparin level a little high RESP- VDRF, CXR shows pulmonary edema  pO2 448 with ECMO RENAL- creatinine up to 1.9, U) 150-20/ hr ENDO- CBG moderately elevated Anemia secondary to ABL- Hct 31 Thrombocytopenia- relatively stable, no bleeding issues GI- protonix for ulcer prophylaxis   LOS: 1 day    Melrose Nakayama 06/06/2021

## 2021-06-06 NOTE — Addendum Note (Signed)
Addendum  created 06/06/21 1518 by Roberts Gaudy, MD   Clinical Note Signed

## 2021-06-06 NOTE — Progress Notes (Addendum)
[place] for IV Heparin Indication:  ECMO and Impella  No Known Allergies  Patient Measurements: Height: 5\' 3"  (160 cm) Weight: 94.7 kg (208 lb 12.4 oz) IBW/kg (Calculated) : 52.4 Heparin Dosing Weight: 72 kg  Vital Signs: Temp: 97.7 F (36.5 C) (06/16 1600) Temp Source: Core (06/16 1600) BP: 74/69 (06/16 1543) Pulse Rate: 85 (06/16 1543)  Labs: Recent Labs    06/01/2021 2033 06/18/2021 2234 06/08/2021 0006 05/29/2021 0450 06/11/2021 1630 06/06/2021 1644 06/09/2021 2055 05/29/2021 2220 06/11/2021 2230 06/06/21 0012 06/06/21 0402 06/06/21 0510 06/06/21 0605 06/06/21 0947 06/06/21 1131 06/06/21 1132 06/06/21 1546  HGB  --   --   --    < > 8.2*   < > 12.2  12.3  --   --    < > 10.9*  --  10.5* 9.2*  --  9.7*  --   HCT  --   --   --    < > 25.5*   < > 36.0  37.2  --   --    < > 33.7*  --  31.0* 27.0*  --  29.9*  --   PLT  --   --   --    < > 120*  --  104*  --   --   --  99*  --   --   --   --   --   --   APTT  --   --   --   --   --   --   --    < > >200*  --  >200*  --   --   --  >200*  --  >200*  LABPROT  --   --   --   --   --   --   --   --  19.2*  --  19.6*  --   --   --   --   --   --   INR  --   --   --   --   --   --   --   --  1.6*  --  1.7*  --   --   --   --   --   --   HEPARINUNFRC  --   --  0.97*   < >  --   --   --    < >  --    < >  --  >1.10*  --   --  0.77*  --  0.32  CREATININE  --   --   --    < >  --    < > 1.76*  --   --   --  1.91*  --   --   --   --   --  1.90*  TROPONINIHS 1,070* 1,455* 1,903*  --   --   --   --   --   --   --   --   --   --   --   --   --   --    < > = values in this interval not displayed.    Estimated Creatinine Clearance: 34.4 mL/min (A) (by C-G formula based on SCr of 1.9 mg/dL (H)).   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 Cristopher Peru)   Thyroid disease    PROCEDURE FOR THYROID 15 YRS AGO AT [facility]   WPW (Wolff-Parkinson-White syndrome)     Medications:  Infusions:   sodium chloride 20 mL/hr at 06/06/21 1700   sodium chloride     sodium chloride     sodium chloride     sodium chloride     sodium chloride 10 mL/hr at 06/06/21 1000   sodium chloride     albumin human 12.5 g (06/06/21 1430)   amiodarone 30 mg/hr (06/06/21 1700)    ceFAZolin (ANCEF) IV Stopped (06/06/21 1609)   dexmedetomidine (PRECEDEX) IV infusion 0.7 mcg/kg/hr (06/06/21 1700)   epinephrine 7 mcg/min (06/06/21 1700)   feeding supplement (PIVOT 1.5 CAL) 1,000 mL (06/06/21 1010)   heparin 25 units/mL (Impella PURGE) in dextrose 5 % 1000 mL bag     HYDROmorphone 4 mg/hr (06/06/21 1700)   insulin 3.2 Units/hr (06/06/21 1700)   lactated ringers     lactated ringers 20 mL/hr at 06/06/21 1700   lactated ringers     milrinone 0.25 mcg/kg/min (06/06/21 1700)   norepinephrine (LEVOPHED) Adult infusion 6 mcg/min (06/06/21 1700)    Assessment:  78 yoF admitted with cardiogenic shock 2/2 LM SCAD. Pt s/p CABG with VA-ECMO and Impella placement.  Heparin level has trended down to 0.32 - at goal.  Heparin via purge currently running at 15.8 mL/hr (25 units/mL) providing 395 units/hr of Heparin. No systemic Heparin at this time.  Hgb trending down at 8.8 (prior 9.7). Last platelets 99.    Goal of Therapy:  Heparin level 0.3 to 0.5 units/ml Monitor platelets by anticoagulation protocol: Yes   Plan:  -Continue to hold systemic heparin -Continue heparinized purge at half-strength (25 units/ml) - ~400 units/hr of heparin -Recheck heparin level in 6h  Sloan Leiter, PharmD, BCPS, BCCCP Clinical Pharmacist Please refer to [facility] for [place] numbers 06/06/2021, 5:57 PM  Addendum:  HL low at 0.12. Impella purge continues at 15.8 mL/hr (25 units/mL) providing 395 units/hr.  No systemic heparin currently. Hgb down at 6.8. Plts at 97.  No overt bleeding noted. Receiving 1 unit of pRBCs currently. System running well.    Plan: Add systemic heparin at low rate of 100 units/hr.  Recheck HL in 4 hours.   Sloan Leiter, PharmD, BCPS, BCCCP Clinical Pharmacist Please refer to [facility] for [place] numbers 06/06/2021, 10:40 PM

## 2021-06-06 NOTE — Progress Notes (Signed)
[facility] Evening Rounds POD 1 s/p CABG for SCAD, VA ecmo Support devices stable Making good UOP Exam unremarkable Not excessive CT output BP (!) 74/69   Pulse 85   Temp 97.7 F (36.5 C) (Core)   Resp 15   Ht 5\' 3"  (1.6 m)   Wt 94.7 kg   SpO2 100%   BMI 36.98 kg/m  A/p: continue supportive care. [name] [name] Z. Orvan Seen, [place]

## 2021-06-06 NOTE — Progress Notes (Signed)
PT Cancellation Note/ Discharge  Patient Details Name: [name] [name] MRN: [id number] DOB: Aug 12, 1961   Cancelled Treatment:    Reason Eval/Treat Not Completed: Patient not medically ready (Pt with ECMO, vent and not yet medically appropriate)   Sherod Cisse B Islay Polanco 06/06/2021, 7:36 AM Bayard Males, PT Acute Rehabilitation Services Pager: [phone] Office: [phone]

## 2021-06-06 NOTE — Progress Notes (Signed)
OT Cancellation Note  Patient Details Name: RENISHA COCKRUM MRN: [id number] DOB: 10/05/1961   Cancelled Treatment:    Reason Eval/Treat Not Completed: Patient not medically ready- Pt with ECMO, vent and not yet medically appropriate, OT will sign off and please re-consult when appropriate.   Jolaine Artist, OT Acute Rehabilitation Services Pager [phone] Office [phone]   Delight Stare 06/06/2021, 7:56 AM

## 2021-06-06 NOTE — Progress Notes (Signed)
Initial Nutrition Assessment  DOCUMENTATION CODES:   Not applicable  INTERVENTION:   Tube Feeding via OG: Pivot 1.5 at 20 ml/hr today, Goal rate of 55 ml/hr Provides 1980 kcals, 124 g of protein and 1003 mL of free water  NUTRITION DIAGNOSIS:   Inadequate oral intake related to acute illness as evidenced by NPO status.  Being addressed via TF   GOAL:   Patient will meet greater than or equal to 90% of their needs  Progressing  MONITOR:   Vent status, TF tolerance, Labs, Weight trends  REASON FOR ASSESSMENT:   Consult, Ventilator Enteral/tube feeding initiation and management (ECMO)  ASSESSMENT:   60 yo female admitted with chest pain with STEMI requiring emergent CABG, cardiogenic shock in setting of left main dissection requiring VA ECMO, intubation PMH includes DM, HTN, HLD, CVA  6/14 Admitted 6/15 Cannulated for VA ECMO, Intubated  Remains on VA ECMO, per MD notes hopeful for possible decannulation and chest closure in 48 to 72 hours  Requiring epinephrine. Sedated on hydromorphone and precedex drips  Pivot 1.5 initiated 20 ml/hr via OG  Unable to obtain diet and weight history from patient at this time  Labs: CBGs 118-208 (ICU goal 140-180) Meds: insulin drip, predecex   NUTRITION - FOCUSED PHYSICAL EXAM:  Flowsheet Row Most Recent Value  Orbital Region No depletion  Upper Arm Region No depletion  Thoracic and Lumbar Region No depletion  Buccal Region Unable to assess  Temple Region No depletion  Clavicle Bone Region No depletion  Clavicle and Acromion Bone Region No depletion  Scapular Bone Region No depletion  Dorsal Hand Unable to assess  Patellar Region Unable to assess  Anterior Thigh Region Unable to assess  Posterior Calf Region Unable to assess  Edema (RD Assessment) None       Diet Order:   Diet Order             Diet NPO time specified  Diet effective now                   EDUCATION NEEDS:   Not appropriate for  education at this time  Skin:  Skin Assessment: Reviewed RN Assessment  Last BM:  6/13  Height:   Ht Readings from Last 1 Encounters:  06/01/2021 5\' 3"  (1.6 m)    Weight:   Wt Readings from Last 1 Encounters:  06/06/21 94.7 kg     BMI:  Body mass index is 36.98 kg/m.  Estimated Nutritional Needs:   Kcal:  1900-2200 kcals  Protein:  105-130 g  Fluid:  >/= 1.9 L   Kerman Passey MS, RDN, LDN, CNSC Registered Dietitian III Clinical Nutrition RD Pager and On-Call Pager Number Located in [place]

## 2021-06-06 NOTE — Progress Notes (Signed)
Pt began to open her eyes during oral care. Pt grimaced to pain and was able to move her head left to right. Pt was not able to follow commands at this time. See MAR for sedation adjustments.

## 2021-06-06 NOTE — Progress Notes (Signed)
Advanced Heart Failure Rounding Note   Subjective:    Remains on VA ECMO with Imella vent (see ECMO note)  On epi 5, NE 3, milrinone 0.25 MAPS 70   Lactate 7.5 -> 5.1  Creatinine 1.8 -> 1.9  CTs with only mild drainage  Heparin level  > 1.0   Making urine,   Woke up a bit overnight. Now sedated    Objective:   Weight Range:  Vital Signs:   Temp:  [97.2 F (36.2 C)-97.5 F (36.4 C)] 97.3 F (36.3 C) (06/16 0700) Pulse Rate:  [0-173] 75 (06/16 0700) Resp:  [0-26] 10 (06/16 0700) BP: (78-147)/(53-109) 85/67 (06/16 0700) SpO2:  [0 %-100 %] 100 % (06/16 0700) Arterial Line BP: (69-81)/(66-74) 80/72 (06/16 0700) FiO2 (%):  [30 %-40 %] 30 % (06/16 0400) Weight:  [89.5 kg-94.7 kg] 94.7 kg (06/16 0500) Last BM Date: 06/03/21  Weight change: Filed Weights   05/28/2021 0904 05/23/2021 1256 06/06/21 0500  Weight: 87.1 kg 89.5 kg 94.7 kg    Intake/Output:   Intake/Output Summary (Last 24 hours) at 06/06/2021 0839 Last data filed at 06/06/2021 0800 Gross per 24 hour  Intake 7582.92 ml  Output 4215 ml  Net 3367.92 ml     Physical Exam: General:  Critically ill appearing. On vent  HEENT: normal + ETT Neck: supple. RIJ swan Carotids 2+ bilat; no bruits. No lymphadenopathy or thryomegaly appreciated. Cor: Chest open ECMO cannulas. CTs ok Lungs: clear Abdomen: obese soft, nontender, nondistended. No hepatosplenomegaly. No bruits or masses. Good bowel sounds. Extremities: no cyanosis, clubbing, rash, 2+ edema Neuro: intubated sedated  Telemetry: NSR 70-80s Personally reviewed   Labs: Basic Metabolic Panel: Recent Labs  Lab 05/27/2021 1003 06/03/2021 0450 05/31/2021 1506 05/28/2021 1644 06/08/2021 1712 06/01/2021 1738 06/09/2021 1823 06/14/2021 1845 06/03/2021 1907 05/26/2021 2055 06/06/21 0012 06/06/21 0345 06/06/21 0402 06/06/21 0605  NA 140 139   < > 139   < > 141   < > 141   < > 144  142 144 142 139 144  K 3.9 3.8   < > 5.7*   < > 5.0   < > 3.8   < > 3.1*  2.9*  3.0* 3.5 3.4* 3.4*  CL 108 110   < > 103  --  112*  --  116*  --  112*  --   --  112*  --   CO2 25 23  --   --   --   --   --   --   --  17*  --   --  17*  --   GLUCOSE 94 81   < > 244*  --  202*  --  208*  --  207*  --   --  251*  --   BUN 16 16   < > 15  --  15  --  16  --  13  --   --  14  --   CREATININE 1.67* 1.50*   < > 1.50*  --  1.40*  --  1.40*  --  1.76*  --   --  1.91*  --   CALCIUM 9.0 8.9  --   --   --   --   --   --   --  7.4*  --   --  7.3*  --   MG  --  1.9  --   --   --   --   --   --   --  1.8  --   --  2.6*  --    < > = values in this interval not displayed.    Liver Function Tests: Recent Labs  Lab 06/06/2021 2055 06/06/21 0402  AST 889* 771*  ALT 290* 221*  ALKPHOS 25* 26*  BILITOT 0.7 0.7  PROT 3.6* 3.8*  ALBUMIN 2.2* 2.6*   No results for input(s): LIPASE, AMYLASE in the last 168 hours. No results for input(s): AMMONIA in the last 168 hours.  CBC: Recent Labs  Lab 06/14/2021 1003 05/29/2021 0450 06/08/2021 1506 05/26/2021 1630 05/27/2021 1644 06/16/2021 2055 06/06/21 0012 06/06/21 0345 06/06/21 0402 06/06/21 0605  WBC 7.0 8.2  --   --   --  11.3*  --   --  9.9  --   HGB 13.2 12.2   < > 8.2*   < > 12.2  12.3 11.6* 10.9* 10.9* 10.5*  HCT 42.2 38.8   < > 25.5*   < > 36.0  37.2 34.0* 32.0* 33.7* 31.0*  MCV 91.3 90.2  --   --   --  88.2  --   --  88.0  --   PLT 230 232  --  120*  --  104*  --   --  99*  --    < > = values in this interval not displayed.    Cardiac Enzymes: No results for input(s): CKTOTAL, CKMB, CKMBINDEX, TROPONINI in the last 168 hours.  BNP: BNP (last 3 results) No results for input(s): BNP in the last 8760 hours.  ProBNP (last 3 results) No results for input(s): PROBNP in the last 8760 hours.    Other results:  Imaging: DG Chest 1 View  Result Date: 06/15/2021 CLINICAL DATA:  Chest pain since early this morning, hypertension, smoker EXAM: CHEST  1 VIEW COMPARISON:  Portable exam 0938 hours compared to 07/21/2017 FINDINGS: VP  shunt tubing traverses RIGHT hemithorax. Borderline enlargement of cardiac silhouette. Mediastinal contours and pulmonary vascularity normal. Minimal subsegmental atelectasis RIGHT upper lobe. Lungs otherwise clear. No pulmonary infiltrate, pleural effusion, or pneumothorax. Bones demineralized. IMPRESSION: Minimal subsegmental atelectasis RIGHT upper lobe. Electronically Signed   By: Lavonia Dana M.D.   On: 06/03/2021 09:52   CARDIAC CATHETERIZATION  Addendum Date: 06/03/2021    Mid LM to Dist LM lesion is 80% stenosed. Has the appearance of spontaneous coronary artery dissection  Ost Cx to Prox Cx lesion is 80% stenosed. Has the appearance of extension from the left main dissection into the proximal circumflex.  Prox LAD lesion is 100% stenosed. Occlusion resulting from extension of the left main dissection into the LAD.  LV end diastolic pressure is normal.  There is no aortic valve stenosis.  Successful placement of Impella CP device in the right groin.  CT surgery consult obtained.  She will go to the operating room for emergent bypass surgery.  Will convey message to anesthesia to look for larger aortic dissection with their TEE probe.   Result Date: 05/25/2021  Mid LM to Dist LM lesion is 80% stenosed. Has the appearance of spontaneous coronary artery dissection  Ost Cx to Prox Cx lesion is 80% stenosed. Has the appearance of extension from the left main dissection into the proximal circumflex.  Prox LAD lesion is 100% stenosed. Occlusion resulting from extension of the left main dissection into the LAD.  LV end diastolic pressure is normal.  There is no aortic valve stenosis.  Successful placement of Impella CP device in the right groin.  CT surgery  consult obtained.  She will go to the operating room for emergent bypass surgery.  Will convey message to anesthesia to look for larger aortic dissection with their TEE probe.   DG CHEST PORT 1 VIEW  Result Date: 06/06/2021 CLINICAL DATA:   Hypoxia EXAM: PORTABLE CHEST 1 VIEW COMPARISON:  June 05, 2021 FINDINGS: Endotracheal tube tip is 4.3 cm above the carina. Nasogastric tube tip and side port are below the diaphragm. Impella device again noted, unchanged in position. Swan-Ganz catheter tip is in the right pulmonary artery. ECMO catheter tip near inferior vena cava-right atrium junction. Ventriculoperitoneal shunt catheter noted on the right. Left chest tube and mediastinal drain unchanged in position. No pneumothorax. Small left pleural effusion. Underlying interstitial edema. No consolidation. There is stable cardiomegaly with pulmonary venous hypertension. No adenopathy. No bone lesions. IMPRESSION: Tube and catheter positions as described without evident pneumothorax. Cardiomegaly with pulmonary vascular congestion. Small left pleural effusion with interstitial edema. Appearance consistent with a degree of underlying congestive heart failure. Electronically Signed   By: Lowella Grip III M.D.   On: 06/06/2021 08:02   DG Chest Port 1 View  Result Date: 06/03/2021 CLINICAL DATA:  60 year old female status post CABG EXAM: PORTABLE CHEST 1 VIEW COMPARISON:  Chest radiograph dated 06/18/2021. FINDINGS: Endotracheal tube with tip across in the ED 4 cm above the carina. Right IJ Swan-Ganz catheter with tip over the right hilum. Partially visualized right-sided VP shunt. Enteric tube extends below the diaphragm with side-port in the left upper abdomen and tip beyond the inferior margin of the image. Inferiorly accessed mediastinal drains. Left subclavian vascular stent and cardiac assist pump cannula. Impella device with tip over the left ventricle. Bilateral patchy opacities involving the left upper lobe and right infrahilar region may represent atelectasis, mild edema, or infiltrate. No pleural effusion or pneumothorax. Pneumomediastinum, likely postsurgical. Mild cardiomegaly. CABG vascular clips. No acute osseous pathology. IMPRESSION: 1.  Postsurgical changes of CABG. 2. Bilateral patchy opacities involving the left upper lobe and right infrahilar region may represent atelectasis, mild edema, or infiltrate. 3. Support devices as described. Electronically Signed   By: Anner Crete M.D.   On: 06/02/2021 21:13   ECHOCARDIOGRAM COMPLETE  Result Date: 05/29/2021    ECHOCARDIOGRAM REPORT   Patient Name:   [name] [name] Date of Exam: 05/26/2021 Medical Rec #:  [id number]       Height:       63.0 in Accession #:    [id number]      Weight:       192.0 lb Date of Birth:  05-Mar-1961        BSA:          1.900 m Patient Age:    6 years        BP:           107/85 mmHg Patient Gender: F               HR:           47 bpm. Exam Location:  [facility] Procedure: 2D Echo, Cardiac Doppler and Color Doppler Indications:    Chest Pain R07.9  History:        Patient has prior history of Echocardiogram examinations, most                 recent 01/14/2016. Stroke, Arrythmias:Atrial Fibrillation and                 Bradycardia; Risk Factors:Hypertension and Current Smoker.  WPW                 (Wolff-Parkinson-White syndrome) (From Hx).  Sonographer:    Alvino Chapel RCS Referring Phys: [id number] Benjamin D [place]  1. Left ventricular ejection fraction, by estimation, is 60 to 65%. The left ventricle has normal function. The left ventricle has no regional wall motion abnormalities. There is mild left ventricular hypertrophy. Left ventricular diastolic parameters are indeterminate. Elevated left atrial pressure.  2. Right ventricular systolic function is normal. The right ventricular size is normal. There is mildly elevated pulmonary artery systolic pressure.  3. The mitral valve is normal in structure. Mild mitral valve regurgitation. No evidence of mitral stenosis.  4. The aortic valve is tricuspid. There is mild calcification of the aortic valve. There is mild thickening of the aortic valve. Aortic valve regurgitation is not visualized. No aortic stenosis is  present.  5. Mild pulmonary HTN, PASP is 36 mmHg.  6. The inferior vena cava is normal in size with greater than 50% respiratory variability, suggesting right atrial pressure of 3 mmHg. FINDINGS  Left Ventricle: Left ventricular ejection fraction, by estimation, is 60 to 65%. The left ventricle has normal function. The left ventricle has no regional wall motion abnormalities. The left ventricular internal cavity size was normal in size. There is  mild left ventricular hypertrophy. Left ventricular diastolic parameters are indeterminate. Elevated left atrial pressure. Right Ventricle: The right ventricular size is normal. Right vetricular wall thickness was not assessed. Right ventricular systolic function is normal. There is mildly elevated pulmonary artery systolic pressure. The tricuspid regurgitant velocity is 2.86 m/s, and with an assumed right atrial pressure of 8 mmHg, the estimated right ventricular systolic pressure is 59.5 mmHg. Left Atrium: Left atrial size was normal in size. Right Atrium: Right atrial size was normal in size. Pericardium: There is no evidence of pericardial effusion. Mitral Valve: The mitral valve is normal in structure. There is mild thickening of the mitral valve leaflet(s). There is mild calcification of the mitral valve leaflet(s). Mild mitral annular calcification. Mild mitral valve regurgitation. No evidence of  mitral valve stenosis. Tricuspid Valve: The tricuspid valve is normal in structure. Tricuspid valve regurgitation is mild . No evidence of tricuspid stenosis. Aortic Valve: The aortic valve is tricuspid. There is mild calcification of the aortic valve. There is mild thickening of the aortic valve. There is mild aortic valve annular calcification. Aortic valve regurgitation is not visualized. No aortic stenosis  is present. Pulmonic Valve: The pulmonic valve was not well visualized. Pulmonic valve regurgitation is not visualized. No evidence of pulmonic stenosis. Aorta: The  aortic root is normal in size and structure. Pulmonary Artery: Mild pulmonary HTN, PASP is 36 mmHg. Venous: The inferior vena cava is normal in size with greater than 50% respiratory variability, suggesting right atrial pressure of 3 mmHg. IAS/Shunts: No atrial level shunt detected by color flow Doppler.  LEFT VENTRICLE PLAX 2D LVIDd:         4.40 cm  Diastology LVIDs:         2.50 cm  LV e' medial:    6.53 cm/s LV PW:         1.00 cm  LV E/e' medial:  15.9 LV IVS:        1.20 cm  LV e' lateral:   6.31 cm/s LVOT diam:     1.80 cm  LV E/e' lateral: 16.5 LV SV:         61 LV SV  Index:   32 LVOT Area:     2.54 cm  RIGHT VENTRICLE RV S prime:     11.60 cm/s TAPSE (M-mode): 2.2 cm LEFT ATRIUM             Index       RIGHT ATRIUM           Index LA diam:        4.30 cm 2.26 cm/m  RA Area:     15.90 cm LA Vol (A2C):   41.4 ml 21.79 ml/m RA Volume:   43.05 ml  22.65 ml/m LA Vol (A4C):   46.9 ml 24.68 ml/m LA Biplane Vol: 44.2 ml 23.26 ml/m  AORTIC VALVE LVOT Vmax:   108.00 cm/s LVOT Vmean:  66.100 cm/s LVOT VTI:    0.239 m  AORTA Ao Root diam: 2.90 cm MITRAL VALVE                TRICUSPID VALVE MV Area (PHT): 3.53 cm     TR Peak grad:   32.7 mmHg MV Decel Time: 215 msec     TR Vmax:        286.00 cm/s MV E velocity: 104.00 cm/s MV A velocity: 82.50 cm/s   SHUNTS MV E/A ratio:  1.26         Systemic VTI:  0.24 m                             Systemic Diam: 1.80 cm Carlyle Dolly MD Electronically signed by Carlyle Dolly MD Signature Date/Time: 05/28/2021/6:15:59 PM    Final      Medications:     Scheduled Medications:  acetaminophen  1,000 mg Oral Q6H   Or   acetaminophen (TYLENOL) oral liquid 160 mg/5 mL  1,000 mg Per Tube Q6H   aspirin EC  325 mg Oral Daily   Or   aspirin  324 mg Per Tube Daily   bisacodyl  10 mg Oral Daily   Or   bisacodyl  10 mg Rectal Daily   chlorhexidine gluconate (MEDLINE KIT)  15 mL Mouth Rinse BID   Chlorhexidine Gluconate Cloth  6 each Topical Daily   docusate  200 mg Per  Tube Daily   [START ON 06/14/2021] levothyroxine  88 mcg Per Tube Daily   mouth rinse  15 mL Mouth Rinse 10 times per day   pantoprazole (PROTONIX) IV  40 mg Intravenous QHS   rosuvastatin  40 mg Per Tube q1800   sodium chloride flush  10-40 mL Intracatheter Q12H   sodium chloride flush  3 mL Intravenous Q12H    Infusions:  sodium chloride 20 mL/hr at 06/06/21 0700   sodium chloride     sodium chloride     sodium chloride     sodium chloride     sodium chloride Stopped (06/06/21 0645)   sodium chloride     albumin human Stopped (06/06/21 0642)   amiodarone 30 mg/hr (06/06/21 0830)    ceFAZolin (ANCEF) IV 200 mL/hr at 06/06/21 0700   dexmedetomidine (PRECEDEX) IV infusion 0.7 mcg/kg/hr (06/06/21 0700)   epinephrine     famotidine (PEPCID) IV Stopped (06/02/2021 2257)   heparin 25 units/mL (Impella PURGE) in dextrose 5 % 1000 mL bag     HYDROmorphone Stopped (06/06/21 0605)   insulin 5.5 Units/hr (06/06/21 0819)   lactated ringers     lactated ringers 20 mL/hr at 06/06/21 0700   lactated ringers  milrinone 0.25 mcg/kg/min (06/06/21 0700)   nitroGLYCERIN     norepinephrine (LEVOPHED) Adult infusion     potassium chloride 10 mEq (06/06/21 0802)    PRN Medications: sodium chloride, sodium chloride, sodium chloride, albumin human, dextrose, HYDROmorphone, lactated ringers, metoprolol tartrate, midazolam, morphine injection, ondansetron (ZOFRAN) IV, oxyCODONE, sodium chloride flush, sodium chloride flush, traMADol   Assessment/Plan   Cardiogenic shock in setting of left main dissection and emergent CABG - chest open on V-A ECMO with Impella vent - continue epi, NE and milrinone to maintain MAP 70s - PA pressures low - volume overloaded - Continue VA ECMO support. Try to diurese - EEG today  2. CAD with acute due to left main dissection - plan as above - continue ASA/statin  3. Acute respiratory failure due to above - ABG ok. Oxygenation is good - Vent per CCM  4.  ECMO circuit - off systemic heparin for now as heparin level high. Heparin in purge with impella - PharmD following  5. AKI - due to shock/ATN - continue support  6. Hypomag/hypokalemia - supp    CRITICAL CARE Performed by: Glori Bickers  Total critical care time: 55 minutes  Critical care time was exclusive of separately billable procedures and treating other patients.  Critical care was necessary to treat or prevent imminent or life-threatening deterioration.  Critical care was time spent personally by me (independent of midlevel providers or residents) on the following activities: development of treatment plan with patient and/or surrogate as well as nursing, discussions with consultants, evaluation of patient's response to treatment, examination of patient, obtaining history from patient or surrogate, ordering and performing treatments and interventions, ordering and review of laboratory studies, ordering and review of radiographic studies, pulse oximetry and re-evaluation of patient's condition.    Length of Stay: 1   Glori Bickers MD 06/06/2021, 8:39 AM  Advanced Heart Failure Team Pager [phone] (M-F; [place])  Please contact [facility] for night-coverage after hours (4p -7a ) and weekends on [email or web address]

## 2021-06-06 NOTE — Progress Notes (Signed)
Anesthesiology Follow-up:  60 year old female who developed spontaneous dissection of the L. Main coronary artery resulting in severe MI. She underwent emergency CABG X 2 yesterday. She suffered a V. Fib arrest prior to starting CP bypass and she was treated with open cardiac massage. She could not be weaned from CP bypass with 3.5 Impella and she was placed on [place] ECMO with central cannulation. Has impella 3.5 for LV venting   She is now sedated on ECMO, neuro status uncertain. Elevated LFTs C/W shock liver, excellent urine output   Milrinone 0.375 mcg/kg/min Epinephrine 3 mcg/min Norepi 5 mcg/min Amiodarone 30 mg/hr Precedex 0.7 mcg/kg/hr   Impella 3.6 lpm Sweep speed 4 lpm MAP 75 mm hg PA mean 10 mm hg Impella at P1 0.3 lpm flow  PH 7.39 PCO2 29 PO2 458  K- 3.3  Cr. 1.91 up from 1.67 pre-op H/H 9.7/29.9 Plts 99,000 AST 771 ALT 221  Plan continued supportive care with ECMO, stable at present.  [name] [name]

## 2021-06-06 NOTE — Progress Notes (Addendum)
ECMO PROGRESS NOTE  NAME:  [name] [name], MRN:  [id number], DOB:  1961/08/30, LOS: 1 ADMISSION DATE:  06/02/2021, CONSULTATION DATE: 06/16/2021 REFERRING MD: Roxan Hockey, CHIEF COMPLAINT: VA ECMO  HPI/course in hospital   60 year old woman with hx of HTN, HLD, hypothyroidism, CVA, DM2 who presented with angina.  Underwent cardiac cath which showed left main dissection, during this procedure became acutely unstable with crushing chest pain and shock.  Taken emergently to OR for bypass after 3-5 impella placed.  Tough to wean from bypass so left centrally cannulated on VA ECMO and sent to [place].  PCCM consulted to help with management.  Past Medical History   Past Medical History:  Diagnosis Date   Arthritis    Brain aneurysm    Diabetes mellitus without complication ([place])    Headache    Hypertension        SVT (supraventricular tachycardia) ([place])    s/p radiofrequency catheter ablation for AVNRT 02/16/09 (Dr. 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 Christella Noa;  Surgeon: Kevan Ny Ditty, MD;  Location: [place];  Service: Neurosurgery;  Laterality: Right;   ELECTROPHYSIOLOGIC STUDY     LAPAROSCOPIC  REVISION VENTRICULAR-PERITONEAL (V-P) SHUNT N/A 02/04/2016   Procedure: LAPAROSCOPIC Insertion VENTRICULAR-PERITONEAL (V-P) SHUNT;  Surgeon: Rolm Bookbinder, MD;  Location: [place] [facility] OR[facility];  Service: General;  Laterality: N/A;   LEFT HEART CATH AND CORONARY ANGIOGRAPHY N/A 05/28/2021   Procedure: LEFT HEART CATH AND CORONARY ANGIOGRAPHY;  Surgeon: Jettie Booze, MD;  Location: [place] [facility];  Service: Cardiovascular;  Laterality: N/A;   RADIOLOGY WITH ANESTHESIA N/A 01/03/2016   Procedure: RADIOLOGY WITH ANESTHESIA;  Surgeon: Medication Radiologist, MD;  Location: [place];  Service: Radiology;  Laterality: N/A;   VENTRICULAR ASSIST DEVICE INSERTION N/A 05/23/2021   Procedure: VENTRICULAR ASSIST DEVICE INSERTION;  Surgeon: Jettie Booze, MD;  Location: [place] [facility];  Service: Cardiovascular;  Laterality: N/A;   VENTRICULOPERITONEAL SHUNT Right 02/04/2016   Procedure: SHUNT INSERTION VENTRICULAR-PERITONEAL With Laparoscopic Assistance;  Surgeon: Kevan Ny Ditty, MD;  Location: [place] NEUR[facility] ORS;  Service: Neurosurgery;  Laterality: Right;     Significant Hospital events:  6/14 admitted 6/15 spontaneous artery dissection left main on cath.CP Impella placed.   6/15 emergent coronary artery bypass graft, LIMA to LAD, SVG to OM.  Brief cardiac arrest of less than 10 minutes immediately prior to initiation of cardiopulmonary bypass. 6/15 unable to wean.  Still unable to wean from bypass so placed on centrally cannulated VA ECMO. Interim history/subjective:  Stable to weaning pressor requirements overnight.  Patient able to wince to painful stimuli.  Objective   Blood pressure Marland Kitchen)  85/67, pulse 75, temperature (!) 97.3 F (36.3 C), resp. rate 10, height _0  (1.6 m), weight 94.7 kg, SpO2 100 %. PAP: (13-25)/(10-16) 22/15  Vent Mode: PRVC FiO2 (%):  [30 %-40 %] 30 % Set Rate:  [10 bmp-12 bmp] 10 bmp Vt Set:  [310 mL-410 mL] 310 mL PEEP:  [5 cmH20] 5 cmH20 Plateau Pressure:  [15  cmH20-18 cmH20] 15 cmH20   Intake/Output Summary (Last 24 hours) at 06/06/2021 0825 Last data filed at 06/06/2021 0800 Gross per 24 hour  Intake 7582.92 ml  Output 4215 ml  Net 3367.92 ml   Filed Weights   06/14/2021 0904 06/16/2021 1256 06/06/21 0500  Weight: 87.1 kg 89.5 kg 94.7 kg    ECMO Device: Cardiohelp  ECMO Mode: VA  Flow (LPM): 3.77   Examination: Physical Exam Constitutional:      Appearance: She is obese.  HENT:     Head: Normocephalic.  Eyes:     Comments: Pinpoint pupils  Neck:     Comments: ET tube and OG tube in place Cardiovascular:     Rate and Rhythm: Normal rate and regular rhythm.     Heart sounds: Normal heart sounds.     Comments: Open chest with central ECMO cannulas.  Right foot cooler than left. Pulmonary:     Breath sounds: Normal breath sounds.     Comments: Mechanically ventilated with acceptable airway pressures. Abdominal:     Palpations: Abdomen is soft.  Skin:    General: Skin is warm and dry.     Capillary Refill: Capillary refill takes 2 to 3 seconds.  Neurological:     Comments: Sedated.  Squeezes hands weakly     Ancillary tests (personally reviewed)  CBC: Recent Labs  Lab 05/22/2021 1003 05/28/2021 0450 06/14/2021 1506 06/09/2021 1630 05/29/2021 1644 05/28/2021 2055 06/06/21 0012 06/06/21 0345 06/06/21 0402 06/06/21 0605  WBC 7.0 8.2  --   --   --  11.3*  --   --  9.9  --   HGB 13.2 12.2   < > 8.2*   < > 12.2  12.3 11.6* 10.9* 10.9* 10.5*  HCT 42.2 38.8   < > 25.5*   < > 36.0  37.2 34.0* 32.0* 33.7* 31.0*  MCV 91.3 90.2  --   --   --  88.2  --   --  88.0  --   PLT 230 232  --  120*  --  104*  --   --  99*  --    < > = values in this interval not displayed.    Basic Metabolic Panel: Recent Labs  Lab 05/28/2021 1003 05/25/2021 0450 06/14/2021 1506 06/02/2021 1644 06/01/2021 1712 06/02/2021 1738 06/08/2021 1823 06/09/2021 1845 06/01/2021 1907 06/03/2021 2055 06/06/21 0012 06/06/21 0345 06/06/21 0402 06/06/21 0605  NA 140 139   < > 139    < > 141   < > 141   < > 144  142 144 142 139 144  K 3.9 3.8   < > 5.7*   < > 5.0   < > 3.8   < > 3.1*  2.9* 3.0* 3.5 3.4* 3.4*  CL 108 110   < > 103  --  112*  --  116*  --  112*  --   --  112*  --   CO2 25 23  --   --   --   --   --   --   --  17*  --   --  17*  --   GLUCOSE 94 81   < > 244*  --  202*  --  208*  --  207*  --   --  251*  --   BUN 16 16   < > 15  --  15  --  16  --  13  --   --  14  --   CREATININE 1.67* 1.50*   < > 1.50*  --  1.40*  --  1.40*  --  1.76*  --   --  1.91*  --   CALCIUM 9.0 8.9  --   --   --   --   --   --   --  7.4*  --   --  7.3*  --   MG  --  1.9  --   --   --   --   --   --   --  1.8  --   --  2.6*  --    < > = values in this interval not displayed.   GFR: Estimated Creatinine Clearance: 34.3 mL/min (A) (by C-G formula based on SCr of 1.91 mg/dL (H)). Recent Labs  Lab 06/02/2021 1003 05/26/2021 0450 05/28/2021 2055 06/06/21 0402  WBC 7.0 8.2 11.3* 9.9  LATICACIDVEN  --   --  7.5* 5.1*    Liver Function Tests: Recent Labs  Lab 05/29/2021 2055 06/06/21 0402  AST 889* 771*  ALT 290* 221*  ALKPHOS 25* 26*  BILITOT 0.7 0.7  PROT 3.6* 3.8*  ALBUMIN 2.2* 2.6*   No results for input(s): LIPASE, AMYLASE in the last 168 hours. No results for input(s): AMMONIA in the last 168 hours.  ABG    Component Value Date/Time   PHART 7.335 (L) 06/06/2021 0605   PCO2ART 35.8 06/06/2021 0605   PO2ART 448 (H) 06/06/2021 0605   HCO3 19.2 (L) 06/06/2021 0605   TCO2 20 (L) 06/06/2021 0605   ACIDBASEDEF 6.0 (H) 06/06/2021 0605   O2SAT 100.0 06/06/2021 0605     Coagulation Profile: Recent Labs  Lab 06/11/2021 2230 06/06/21 0402  INR 1.6* 1.7*    Cardiac Enzymes: No results for input(s): CKTOTAL, CKMB, CKMBINDEX, TROPONINI in the last 168 hours.  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 CP Impella to offload LV. Status post two-vessel bypass AKI due to hypotension Shock liver due to hypotension Status post PEA cardiac arrest Uncontrolled type 2 diabetes  Plan:  -Titrate norepinephrine to keep MAP greater than 65 -Continue norepinephrine and milrinone at current doses. -Maintain current ECMO flow.  Observe for spontaneous recovery and return of pulsatility. -Allow acidosis to correct. -Diurese today. -Anticipate short ECMO run with possible decannulation and chest closure in 48 to 72 hours. -EEG today  Daily Goals Checklist  Pain/Anxiety/Delirium protocol (if indicated): Dilaudid and dexmedetomidine to  keep RASS goal -3 VAP protocol (if indicated): Bundle in place Respiratory support goals: Rest ventilator settings.  No weaning at this time. Blood pressure target: MAP greater than 65, normal cardiac output. DVT prophylaxis: On systemic heparin for VA ECMO Nutritional status and feeding goals: Initiate trickle feeds GI prophylaxis: Pepcid Fluid status goals: Diurese today Urinary catheter: Assessment of intravascular volume Central lines: Right IJ MAC introducer, left radial art line Glucose control: Uncontrolled type 2 diabetes on insulin infusion, adjust per Endo tool. Mobility/therapy needs: Bedrest Antibiotic de-escalation: Perioperative antibiotics Home medication reconciliation: On hold Daily labs: Per ECMO protocol Code Status: Full code Family Communication: Family to be updated Disposition: ICU  CRITICAL CARE Performed by: Kipp Brood   Total critical care time: 60 minutes  Critical care  time was exclusive of separately billable procedures and treating other patients.  Critical care was necessary to treat or prevent imminent or life-threatening deterioration.  Critical care was time spent personally by me on the following activities: development of treatment plan with patient and/or surrogate as well as nursing, discussions with consultants, evaluation of patient's response to treatment, examination of patient, obtaining history from patient or surrogate, ordering and performing treatments and interventions, ordering and review of laboratory studies, ordering and review of radiographic studies, pulse oximetry, re-evaluation of patient's condition and participation in multidisciplinary rounds.  Kipp Brood, MD [facility] ICU Physician [place]  Pager: [phone] Mobile: [phone] After hours: [phone].   06/06/2021, 8:25 AM

## 2021-06-06 NOTE — Anesthesia Postprocedure Evaluation (Signed)
Anesthesia Post Note  Patient: [name] [name]  Procedure(s) Performed: CORONARY ARTERY BYPASS GRAFTING (CABG)X2.USING  LEFT INTERNAL MAMMARY ARTERY AND  RIGHT ENDOSCOPIC SAPHENOUS VEIN HARVESTING. ECMO INSERTION (Chest) TRANSESOPHAGEAL ECHOCARDIOGRAM (TEE)     Patient location during evaluation: SICU Anesthesia Type: General Level of consciousness: sedated Pain management: pain level controlled Vital Signs Assessment: post-procedure vital signs reviewed and stable Respiratory status: patient remains intubated per anesthesia plan Cardiovascular status: stable Postop Assessment: no apparent nausea or vomiting Anesthetic complications: no   No notable events documented.  Last Vitals:  Vitals:   06/06/21 0500 06/06/21 0600  BP: (!) 82/68 (!) 78/54  Pulse: 76   Resp: 10 10  Temp: (!) 36.4 C (!) 36.4 C  SpO2: 100%     Last Pain:  Vitals:   06/06/21 0400  TempSrc: Core  PainSc:                  March Rummage Ebon Ketchum

## 2021-06-06 NOTE — Progress Notes (Signed)
ECMO attending evening rounds  60 year old woman on [place] ECMO post emergency CABGx2 for LM SCAT.   Centrally cannulated - circuit is intact. 3.51Lpm at 2800rpm. Variable pulsatility.   Drop in BP following lasix required albumin and increase in epinephrine to maintain MAP >70  Right hand cold with tense swelling at prior cath site. No improvement with ACE wrap in place.   Vascular surgery called and will evaluate.   pH corrected with ventilator. Repeat electrolytes pending.   Will avoid additional diuresis today. Generalized edema has improved somewhat. Wean pressors as tolerated and use smaller diuretic doses going forward.   CRITICAL CARE Performed by: Kipp Brood   Total critical care time: 30 minutes  Critical care time was exclusive of separately billable procedures and treating other patients.  Critical care was necessary to treat or prevent imminent or life-threatening deterioration.  Critical care was time spent personally by me on the following activities: development of treatment plan with patient and/or surrogate as well as nursing, discussions with consultants, evaluation of patient's response to treatment, examination of patient, obtaining history from patient or surrogate, ordering and performing treatments and interventions, ordering and review of laboratory studies, ordering and review of radiographic studies, pulse oximetry, re-evaluation of patient's condition and participation in multidisciplinary rounds.  Kipp Brood, MD [facility] ICU Physician [place]  Pager: [phone] Mobile: [phone] After hours: [phone].   06/06/2021, 4:29 PM

## 2021-06-06 NOTE — Progress Notes (Signed)
EEG complete - results pending 

## 2021-06-06 NOTE — Procedures (Signed)
Patient Name: [name] [name]  MRN: [id number]  Epilepsy Attending: Lora Havens  Referring Physician/Provider: Dr Kipp Brood Date: 06/06/2021 Duration: 25.04 mins  Patient history: 61 year old female status post cardiac arrest.  EEG done for seizures.  Level of alertness: comatose  AEDs during EEG study: None  Technical aspects: This EEG study was done with scalp electrodes positioned according to the 10-20 International system of electrode placement. Electrical activity was acquired at a sampling rate of 500Hz  and reviewed with a high frequency filter of 70Hz  and a low frequency filter of 1Hz . EEG data were recorded continuously and digitally stored.   Description: EEG showed continuous generalized 3 to 6 Hz theta-delta slowing.  EEG was reactive to noxious stimulation. Hyperventilation and photic stimulation were not performed.     ABNORMALITY - Continuous slow, generalized  IMPRESSION: This study is suggestive of severe diffuse encephalopathy, nonspecific etiology. No seizures or epileptiform discharges were seen throughout the recording.  Iban Utz Barbra Sarks

## 2021-06-06 NOTE — Consult Note (Addendum)
[facility] OF   ASSESSMENT / PLAN: 60 y.o. female with cool right upper extremity after trans-ulnar catheterization. She may have a access site thrombosis or dissection. She has no doppler flow detectable in radial or ulnar arteries bilaterally. She has a weak doppler signal in her right and left brachial artery. A sensorimotor exam is not possible at the moment. I and the critical care team are concerned that taking her to the OR for an operative exploration may be fatal. For now, recommend warming the extremity and weaning vasopressors as able. She will continue anticoagulation for ECMO which will be beneficial here. If her hand clinically deteriorates (I.e. has frank ischemic skin changes), will plan to re-evaluate. Please call for any concerns.  CHIEF COMPLAINT: Right hand  HISTORY OF PRESENT ILLNESS: [name] [name] is a 60 y.o. 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 Prior to OR she underwent cardiac catheterization via right trans-ulnar access.  This morning, the bedside nurse noted the right forearm to be tense.  No Doppler flow was appreciated in the right wrist or left wrist.  The right hand appeared cool.  Vascular surgery consultation was requested this evening when the hand did not improve.    On my arrival, the patient is intubated and sedated.  All history is per chart review and discussion with staff.  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 Cristopher Peru)   Thyroid disease    PROCEDURE FOR THYROID 15 YRS AGO AT [facility]   WPW (Wolff-Parkinson-White syndrome)     Past Surgical History:   Procedure Laterality Date   BREAST SURGERY     BX RT BREAST  BENIGN   CORONARY ARTERY BYPASS GRAFT N/A 05/26/2021   Procedure: CORONARY ARTERY BYPASS GRAFTING (CABG)X2.USING  LEFT INTERNAL MAMMARY ARTERY AND  RIGHT ENDOSCOPIC SAPHENOUS VEIN HARVESTING. 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 Christella Noa;  Surgeon: Kevan Ny Ditty, MD;  Location: [facility];  Service: Neurosurgery;  Laterality: Right;   ELECTROPHYSIOLOGIC STUDY     LAPAROSCOPIC REVISION VENTRICULAR-PERITONEAL (V-P) SHUNT N/A 02/04/2016   Procedure: LAPAROSCOPIC Insertion VENTRICULAR-PERITONEAL (V-P) SHUNT;  Surgeon: Rolm Bookbinder, MD;  Location: [facility];  Service: General;  Laterality: N/A;   LEFT HEART CATH AND CORONARY ANGIOGRAPHY N/A 06/06/2021   Procedure: LEFT HEART CATH AND CORONARY ANGIOGRAPHY;  Surgeon: Jettie Booze, MD;  Location: [facility];  Service: Cardiovascular;  Laterality: N/A;   RADIOLOGY WITH ANESTHESIA N/A 01/03/2016  Procedure: RADIOLOGY WITH ANESTHESIA;  Surgeon: Medication Radiologist, MD;  Location: [facility];  Service: Radiology;  Laterality: N/A;   TEE WITHOUT CARDIOVERSION N/A 06/06/2021   Procedure: TRANSESOPHAGEAL ECHOCARDIOGRAM (TEE);  Surgeon: Melrose Nakayama, MD;  Location: [facility];  Service: Open  Heart Surgery;  Laterality: N/A;   VENTRICULAR ASSIST DEVICE INSERTION N/A 06/14/2021   Procedure: VENTRICULAR ASSIST DEVICE INSERTION;  Surgeon: Jettie Booze, MD;  Location: [facility];  Service: Cardiovascular;  Laterality: N/A;   VENTRICULOPERITONEAL SHUNT Right 02/04/2016   Procedure: SHUNT INSERTION VENTRICULAR-PERITONEAL With Laparoscopic Assistance;  Surgeon: Kevan Ny Ditty, MD;  Location: [facility];  Service: Neurosurgery;  Laterality: Right;    Family History  Problem Relation Age of Onset   Hypertension Mother    Prostate cancer Father    Cancer Sister    Colon cancer Neg Hx    Colon polyps Neg Hx     Social History   Socioeconomic History   Marital status: Single    Spouse name: Not on file   Number of children: Not on file   Years of education: Not on file   Highest education level: Not on file  Occupational History   Not on file  Tobacco Use   Smoking status: Every Day    Packs/day: 0.50    Pack years: 0.00    Types: Cigarettes   Smokeless tobacco: Never  Vaping Use   Vaping Use: Never used  Substance and Sexual Activity   Alcohol use: No   Drug use: No   Sexual activity: Not on file  Other Topics Concern   Not on file  Social History Narrative   Not on file   Social Determinants of Health   Financial Resource Strain: Not on file  Food Insecurity: Not on file  Transportation Needs: Not on file  Physical Activity: Not on file  Stress: Not on file  Social Connections: Not on file  Intimate Partner Violence: Not on file    No Known Allergies  Current Facility-Administered Medications  Medication Dose Route Frequency Provider Last Rate Last Admin   0.45 % sodium chloride infusion   Intravenous Continuous PRN Barrett, Erin R, PA-C 20 mL/hr at 06/06/21 1700 Infusion Verify at 06/06/21 1700   0.9 %  sodium chloride infusion (Manually program via Guardrails IV Fluids)   Intra-arterial PRN Barrett, Erin R, PA-C       0.9 %  sodium  chloride infusion (Manually program via Guardrails IV Fluids)   Intravenous Continuous Barrett, Erin R, PA-C       0.9 %  sodium chloride infusion (Manually program via Guardrails IV Fluids)   Intravenous PRN Barrett, Erin R, PA-C       0.9 %  sodium chloride infusion  250 mL Intravenous Continuous Barrett, Erin R, PA-C       0.9 %  sodium chloride infusion   Intravenous Continuous Barrett, Erin R, PA-C       0.9 %  sodium chloride infusion   Intravenous Continuous Barrett, Erin R, PA-C       0.9 %  sodium chloride infusion   Intravenous Continuous Barrett, Erin R, PA-C 10 mL/hr at 06/06/21 1000 Infusion Verify at 06/06/21 1000   0.9 %  sodium chloride infusion  10 mL/hr Intravenous Once Rachael Darby T, CRNA       acetaminophen (TYLENOL) tablet 1,000 mg  1,000 mg Oral Q6H Barrett, Erin R, PA-C       Or   acetaminophen (TYLENOL)  160 MG/5ML solution 1,000 mg  1,000 mg Per Tube Q6H Barrett, Erin R, PA-C   1,000 mg at 06/06/21 1147   albumin human 5 % solution 12.5 g  12.5 g Intravenous PRN Bensimhon, Shaune Pascal, MD 60 mL/hr at 06/06/21 1430 12.5 g at 06/06/21 1430   amiodarone (NEXTERONE PREMIX) 360-4.14 MG/200ML-% (1.8 mg/mL) IV infusion  30 mg/hr Intravenous Continuous Barrett, Erin R, PA-C 16.67 mL/hr at 06/06/21 1700 30 mg/hr at 06/06/21 1700   aspirin EC tablet 325 mg  325 mg Oral Daily Barrett, Erin R, PA-C       Or   aspirin chewable tablet 324 mg  324 mg Per Tube Daily Barrett, Erin R, PA-C   324 mg at 06/06/21 1030   bisacodyl (DULCOLAX) EC tablet 10 mg  10 mg Oral Daily Barrett, Erin R, PA-C   10 mg at 06/06/21 1030   Or   bisacodyl (DULCOLAX) suppository 10 mg  10 mg Rectal Daily Barrett, Erin R, PA-C       ceFAZolin (ANCEF) IVPB 2g/100 mL premix  2 g Intravenous Q8H Barrett, Erin R, PA-C   Stopped at 06/06/21 1609   chlorhexidine gluconate (MEDLINE KIT) (PERIDEX) 0.12 % solution 15 mL  15 mL Mouth Rinse BID Melrose Nakayama, MD   15 mL at 06/06/21 0745   Chlorhexidine Gluconate  Cloth 2 % PADS 6 each  6 each Topical Daily Barrett, Erin R, PA-C   6 each at 06/06/21 1032   dexmedetomidine (PRECEDEX) 400 MCG/100ML (4 mcg/mL) infusion  0-0.7 mcg/kg/hr Intravenous Continuous Barrett, Erin R, PA-C 15.58 mL/hr at 06/06/21 1700 0.7 mcg/kg/hr at 06/06/21 1700   dextrose 50 % solution 0-50 mL  0-50 mL Intravenous PRN Melrose Nakayama, MD       docusate (COLACE) 50 MG/5ML liquid 200 mg  200 mg Per Tube Daily Melrose Nakayama, MD   200 mg at 06/06/21 1031   EPINEPHrine (ADRENALIN) 10 mg in sodium chloride 0.9 % 250 mL (0.04 mg/mL) infusion  0-10 mcg/min Intravenous Titrated Bensimhon, Shaune Pascal, MD 10.5 mL/hr at 06/06/21 1700 7 mcg/min at 06/06/21 1700   feeding supplement (PIVOT 1.5 CAL) liquid 1,000 mL  1,000 mL Per Tube Continuous Agarwala, Einar Grad, MD 20 mL/hr at 06/06/21 1010 1,000 mL at 06/06/21 1010   heparin 25 units/mL (Impella PURGE) in dextrose 5% 1000 mL   Intracatheter Continuous Einar Grad, [facility]   New Bag at 06/06/21 4765   HYDROmorphone (DILAUDID) 50 mg in sodium chloride 0.9 % 100 mL (0.5 mg/mL) infusion  0-8 mg/hr Intravenous Continuous Agarwala, Einar Grad, MD 8 mL/hr at 06/06/21 1700 4 mg/hr at 06/06/21 1700   HYDROmorphone (DILAUDID) bolus via infusion 1 mg  1 mg Intravenous Q30 min PRN Kipp Brood, MD       insulin regular, human (MYXREDLIN) 100 units/ 100 mL infusion   Intravenous Continuous Melrose Nakayama, MD 3.2 mL/hr at 06/06/21 1700 3.2 Units/hr at 06/06/21 1700   lactated ringers infusion 500 mL  500 mL Intravenous Once PRN Barrett, Erin R, PA-C       lactated ringers infusion   Intravenous Continuous Barrett, Erin R, PA-C 20 mL/hr at 06/06/21 1700 Infusion Verify at 06/06/21 1700   lactated ringers infusion   Intravenous Continuous Barrett, Erin R, PA-C       MEDLINE mouth rinse  15 mL Mouth Rinse 10 times per day Melrose Nakayama, MD   15 mL at 06/06/21 1629   metoprolol tartrate (LOPRESSOR) injection 2.5-5 mg  2.5-5 mg Intravenous Q2H  PRN Barrett, Erin R, PA-C       midazolam (VERSED) injection 2 mg  2 mg Intravenous Q1H PRN Barrett, Erin R, PA-C   2 mg at 06/06/21 1129   milrinone (PRIMACOR) 20 MG/100 ML (0.2 mg/mL) infusion  0.25 mcg/kg/min Intravenous Continuous Bensimhon, Shaune Pascal, MD 6.68 mL/hr at 06/06/21 1700 0.25 mcg/kg/min at 06/06/21 1700   norepinephrine (LEVOPHED) 16 mg in 243m premix infusion  0-40 mcg/min Intravenous Titrated Bensimhon, DShaune Pascal MD 5.63 mL/hr at 06/06/21 1700 6 mcg/min at 06/06/21 1700   ondansetron (ZOFRAN) injection 4 mg  4 mg Intravenous Q6H PRN Barrett, Erin R, PA-C       pantoprazole sodium (PROTONIX) 40 mg/20 mL oral suspension 40 mg  40 mg Per Tube Daily Agarwala, REinar Grad MD   40 mg at 06/06/21 1031   potassium chloride (KLOR-CON) packet 40 mEq  40 mEq Oral BID AKipp Brood MD   40 mEq at 06/06/21 1031   rosuvastatin (CRESTOR) tablet 40 mg  40 mg Per Tube q1800 Bensimhon, DShaune Pascal MD       sodium chloride flush (NS) 0.9 % injection 10-40 mL  10-40 mL Intracatheter Q12H HMelrose Nakayama MD       sodium chloride flush (NS) 0.9 % injection 10-40 mL  10-40 mL Intracatheter PRN HMelrose Nakayama MD       sodium chloride flush (NS) 0.9 % injection 3 mL  3 mL Intravenous Q12H Barrett, Erin R, PA-C   3 mL at 06/06/21 1032   sodium chloride flush (NS) 0.9 % injection 3 mL  3 mL Intravenous PRN Barrett, Erin R, PA-C        REVIEW OF SYSTEMS:  Unable to obtain, patient critically ill, intubated, and sedated.  PHYSICAL EXAM Vitals:   06/06/21 1200 06/06/21 1206 06/06/21 1543 06/06/21 1600  BP:  (!) 75/73 (!) 74/69   Pulse:  93 85   Resp:  15 15   Temp:    97.7 F (36.5 C)  TempSrc: Core   Core  SpO2: 100% 100% 100%   Weight:      Height:       Constitutional: Critically ill.  No distress.   Neurologic: Unable to participate in exam.  Patient sedated and encephalopathic. Psychiatric: Unable to participate in exam.  Patient sedated and encephalopathic. Eyes: No icterus. No  conjunctival pallor. Ears, nose, throat:  mucous membranes moist. Midline trachea.  Cardiac: Open chest. Centrally cannulated for ECMO. On multidrug infusion to support cardiac function (milrinone, epinephrine, norepinephrine) Respiratory: unlabored on ventilator. Abdominal: soft, non-tender, non-distended.  Peripheral vascular: weak dopper flow in bilateral brachial artery. No doppler flow in radial / ulnar arteries of bilateral upper extremities. No doppler flow in bilateral palmar arch. Right hand cooler than left.  Extremity: No edema. No cyanosis. No pallor.  Skin: No gangrene. No ulceration.  Lymphatic: No Stemmer's sign. No palpable lymphadenopathy.  PERTINENT LABORATORY AND RADIOLOGIC DATA  Most recent CBC CBC Latest Ref Rng & Units 06/06/2021 06/06/2021 06/06/2021  WBC 4.0 - 10.5 K/uL - - -  Hemoglobin 12.0 - 15.0 g/dL 9.7(L) 9.2(L) 10.5(L)  Hematocrit 36.0 - 46.0 % 29.9(L) 27.0(L) 31.0(L)  Platelets 150 - 400 K/uL - - -     Most recent CMP CMP Latest Ref Rng & Units 06/06/2021 06/06/2021 06/06/2021  Glucose 70 - 99 mg/dL 178(H) - -  BUN 6 - 20 mg/dL 14 - -  Creatinine 0.44 - 1.00 mg/dL 1.90(H) - -  Sodium 135 - 145 mmol/L 141 146(H) 144  Potassium 3.5 - 5.1 mmol/L 3.7 3.3(L) 3.4(L)  Chloride 98 - 111 mmol/L 113(H) - -  CO2 22 - 32 mmol/L 20(L) - -  Calcium 8.9 - 10.3 mg/dL 7.4(L) - -  Total Protein 6.5 - 8.1 g/dL - - -  Total Bilirubin 0.3 - 1.2 mg/dL - - -  Alkaline Phos 38 - 126 U/L - - -  AST 15 - 41 U/L - - -  ALT 0 - 44 U/L - - -    Renal function Estimated Creatinine Clearance: 34.4 mL/min (A) (by C-G formula based on SCr of 1.9 mg/dL (H)).  Hgb A1c MFr Bld (%)  Date Value  06/11/2021 5.4    LDL Cholesterol  Date Value Ref Range Status  05/25/2021 82 0 - 99 mg/dL Final    Comment:           Total Cholesterol/HDL:CHD Risk Coronary Heart Disease Risk Table                     Men   Women  1/2 Average Risk   3.4   3.3  Average Risk       5.0   4.4  2 X  Average Risk   9.6   7.1  3 X Average Risk  23.4   11.0        Use the calculated Patient Ratio above and the CHD Risk Table to determine the patient's CHD Risk.        ATP III CLASSIFICATION (LDL):  <100     mg/dL   Optimal  100-129  mg/dL   Near or Above                    Optimal  130-159  mg/dL   Borderline  160-189  mg/dL   High  >190     mg/dL   Very High Performed at [facility], [address] [place], [place] [address]     [name]. Stanford Breed, MD [facility] Phone Number: [phone] 06/06/2021 5:33 PM

## 2021-06-06 NOTE — Progress Notes (Signed)
[facility] for IV Heparin Indication:  ECMO and Impella  No Known Allergies  Patient Measurements: Height: 5\' 3"  (160 cm) Weight: 89.5 kg (197 lb 5 oz) IBW/kg (Calculated) : 52.4 Heparin Dosing Weight: 72 kg  Vital Signs: Temp: 97.3 F (36.3 C) (06/16 0200) BP: 110/84 (06/15 1412) Pulse Rate: 87 (06/16 0100)  Labs: Recent Labs    05/29/2021 1003 05/24/2021 2033 06/18/2021 2234 06/16/2021 0006 06/11/2021 0450 05/23/2021 0851 05/29/2021 1506 05/30/2021 1630 05/29/2021 1644 05/22/2021 1738 06/09/2021 1823 06/19/2021 1845 06/08/2021 1907 06/16/2021 1927 05/24/2021 2055 05/22/2021 2220 06/09/2021 2230 06/06/21 0012 06/06/21 0048  HGB  --   --   --   --  12.2  --    < > 8.2*   < > 8.5*   < > 8.5*   < > 9.2* 12.2  12.3  --   --  11.6*  --   HCT  --   --   --   --  38.8  --    < > 25.5*   < > 25.0*   < > 25.0*   < > 27.0* 36.0  37.2  --   --  34.0*  --   PLT  --   --   --   --  232  --   --  120*  --   --   --   --   --   --  104*  --   --   --   --   APTT  --   --   --   --   --   --   --   --   --   --   --   --   --   --   --   --  >200*  --   --   LABPROT  --   --   --   --   --   --   --   --   --   --   --   --   --   --   --   --  19.2*  --   --   INR  --   --   --   --   --   --   --   --   --   --   --   --   --   --   --   --  1.6*  --   --   HEPARINUNFRC   < >  --   --  0.97*  --  0.95*  --   --   --   --   --   --   --   --   --  0.48  --   --  >1.10*  CREATININE  --   --   --   --  1.50*  --    < >  --    < > 1.40*  --  1.40*  --   --  1.76*  --   --   --   --   TROPONINIHS  --  1,070* 1,455* 1,903*  --   --   --   --   --   --   --   --   --   --   --   --   --   --   --    < > = values in this interval  not displayed.     Estimated Creatinine Clearance: 36.1 mL/min (A) (by C-G formula based on SCr of 1.76 mg/dL (H)).   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 Cristopher Peru)   Thyroid disease    PROCEDURE FOR THYROID 15 YRS AGO AT [facility]   WPW (Wolff-Parkinson-White syndrome)     Medications:  Infusions:   sodium chloride Stopped (06/06/21 0150)   sodium chloride     sodium chloride     sodium chloride     sodium chloride     sodium chloride 10 mL/hr at 06/06/21 0200   sodium chloride     sodium chloride     albumin human Stopped (06/09/2021 2155)   amiodarone 30 mg/hr (06/06/21 0200)    ceFAZolin (ANCEF) IV Stopped (06/02/2021 2337)   dexmedetomidine (PRECEDEX) IV infusion 0.7 mcg/kg/hr (06/06/21 0200)   dextrose 5 % and 0.45% NaCl     epinephrine 5 mcg/min (06/06/21 0200)   famotidine (PEPCID) IV Stopped (05/23/2021 2257)   heparin 50 units/mL (Impella PURGE) in dextrose 5 % 1000 mL bag     HYDROmorphone 4 mg/hr (06/06/21 0200)   insulin Stopped (06/06/21 0102)   lactated ringers     lactated ringers 20 mL/hr at 06/06/21 0200   lactated ringers     magnesium sulfate 20 mL/hr at 06/06/21 0200   milrinone 0.25 mcg/kg/min (06/06/21 0200)   nitroGLYCERIN     norepinephrine (LEVOPHED) Adult infusion 2 mcg/min (05/22/2021 2141)   phenylephrine (NEO-SYNEPHRINE) Adult infusion     potassium chloride 50 mL/hr at 06/06/21 0200    Assessment: 60 years of age female s/p CABG x2 on VA ECMO with Impella to start IV heparin per pharmacy dosing protocol.   ACT low at 140-starting Impella purge at 50 units/mL.  Prior Heparin levels were elevated on rate of 800 units/hr prior to procedures.   Paltets 120 >>104. Hgb 12.3 after 2 units PRBCs in OR.  6/16 AM update:  Impella heparin currently running at 15.9 mL/hr (50 units/mL) providing 795 units/hr plus systemic heparin running at 500 units/hr for a total of 1295 units/hr of heparin  Heparin level is elevated at >1.1, drawn appropriately    Goal of  Therapy:  Heparin level 0.3 to 0.5 units/ml Monitor platelets by anticoagulation protocol: Yes   Plan:  Will hold systemic heparin for now Cont heparin purge at 15.9 mL/hr (795 units/hr) Re-check heparin level in 6 hours Re-start systemic heparin as able  Narda Bonds, PharmD, BCPS Clinical Pharmacist Phone: [phone]

## 2021-06-06 NOTE — Hospital Course (Addendum)
History of Present Illness:  Ms. Boch is a 60 yo AA female with known history of DM, HTN, DVT, WPW, and multiple brain surgeries for clipping of aneurysms.  She presented to the ED with complaints of chest pain.  Work up with EKG showed NSR with Q waves in III and aVF.  Cardiac Troponin levels were elevated.  She was taken to the catheterization lab and was found to have SCAD.  She unfortunately developed increase chest pain and became unstable requiring placement of Impella Device.  Cardiothoracic surgery was consulted for emergent Coronary bypass grafting procedure.  She was evaluated by Dr. Roxan Hockey who was in agreement the patient would require emergent intervention.  She require intubation prior to transportation to the operating room  Hospital Course:  The patient was taken emergently the operating room.  Prior to beginning of surgery she developed V. Fib and Asystole requiring CPR.  She was emergently placed on coronary bypass and ultimately underwent CABG x 2 utilizing LIMA to LAD, SVG to to OM.  Unfortunately she was unable to be weaned from cardiopulmonary bypass.  She required initiation of ECMO.  She tolerated the procedure and was taken to the SICU in critical condition.  ECMO/Advanced heart failure service were consulted to aide in management of patient.  She required Epinephrine, and Milrinone.  She was treated with diuretics and developed drop in blood pressure and required albumin and her epinephrine dose had to be increased.  She developed a cool right upper extremity where her cath was performed.  Vascular surgery consult was obtained they evaluated the patient and the patient's exam showed good doppler flow to the right radial, ulnar, and palmar arch.  It was felt she would not require intervention.  She underwent washout of her sternum on 06/11/2021.  She developed near collapse of her RUL.  This was felt to be related to mucous plugging and bronchoscopy was performed to clear this.   The patient remained neurologically intact.  TEE was performed on 06/09/2021 and showed reduced EF of 30-35%, no evidence of significant valve dysfunction, and hypokinesis of the right ventricle. Her ECMO wean was started on 06/18/2021.  She required additional transfusion of packed cells.  The ECMO wean was tolerated and she was decannulated on Impella on 05/31/2021.  She became febrile and was started on broad spectrum antibiotics.  Despite this she developed worsening leukocytosis.  She was volume overloaded and treated with a lasix drip.  Cortrak was placed on 6/22 with initiation of feedings.  She underwent cardioversion for Atrial Fibrillation with successful restoration of NSR.  She had persistent post operative thrombocytopenia with underlying Von Willebrands disease.  She required heparin with Impella in place.  HIT panel was obtained and was negative.  The patient developed increase pressor requirement with marginal cardiac output.  There was a discussion with her family about transitioning patient to an Impella 5.5 device.  However there was concern this would not improve her outcome.  Ultimately her family was not agreeable with replacement.  The patient developed Atrial Fibrillation and was re-bolused with Amiodarone.  She developed emesis overnight which was concerning that it looked like stool.  Her tube feeds were held and OGT was placed to suction.  She required additional transfusion for low hemoglobin levels.

## 2021-06-07 ENCOUNTER — Encounter (HOSPITAL_COMMUNITY): Payer: Self-pay | Admitting: Certified Registered"

## 2021-06-07 ENCOUNTER — Encounter (HOSPITAL_COMMUNITY)
Admission: EM | Disposition: E | Payer: Self-pay | Source: Home / Self Care | Attending: Thoracic Surgery (Cardiothoracic Vascular Surgery)

## 2021-06-07 ENCOUNTER — Inpatient Hospital Stay (HOSPITAL_COMMUNITY): Payer: 59

## 2021-06-07 DIAGNOSIS — J942 Hemothorax: Secondary | ICD-10-CM

## 2021-06-07 DIAGNOSIS — R570 Cardiogenic shock: Secondary | ICD-10-CM | POA: Diagnosis not present

## 2021-06-07 DIAGNOSIS — Z951 Presence of aortocoronary bypass graft: Secondary | ICD-10-CM

## 2021-06-07 DIAGNOSIS — Z7189 Other specified counseling: Secondary | ICD-10-CM

## 2021-06-07 DIAGNOSIS — R079 Chest pain, unspecified: Secondary | ICD-10-CM | POA: Diagnosis not present

## 2021-06-07 DIAGNOSIS — Z515 Encounter for palliative care: Secondary | ICD-10-CM

## 2021-06-07 HISTORY — PX: STERNAL WOUND DEBRIDEMENT: SHX1058

## 2021-06-07 LAB — BASIC METABOLIC PANEL
Anion gap: 6 (ref 5–15)
Anion gap: 3 — ABNORMAL LOW (ref 5–15)
BUN: 14 mg/dL (ref 6–20)
BUN: 12 mg/dL (ref 6–20)
CO2: 22 mmol/L (ref 22–32)
CO2: 22 mmol/L (ref 22–32)
Calcium: 7.6 mg/dL — ABNORMAL LOW (ref 8.9–10.3)
Calcium: 7.5 mg/dL — ABNORMAL LOW (ref 8.9–10.3)
Chloride: 114 mmol/L — ABNORMAL HIGH (ref 98–111)
Chloride: 116 mmol/L — ABNORMAL HIGH (ref 98–111)
Creatinine, Ser: 1.93 mg/dL — ABNORMAL HIGH (ref 0.44–1.00)
Creatinine, Ser: 1.66 mg/dL — ABNORMAL HIGH (ref 0.44–1.00)
GFR, Estimated: 29 mL/min — ABNORMAL LOW (ref >60)
GFR, Estimated: 35 mL/min — ABNORMAL LOW (ref >60)
Glucose, Bld: 177 mg/dL — ABNORMAL HIGH (ref 70–99)
Glucose, Bld: 143 mg/dL — ABNORMAL HIGH (ref 70–99)
Potassium: 4.3 mmol/L (ref 3.5–5.1)
Potassium: 4.6 mmol/L (ref 3.5–5.1)
Sodium: 142 mmol/L (ref 135–145)
Sodium: 141 mmol/L (ref 135–145)

## 2021-06-07 LAB — CBC
HCT: 21.9 % — ABNORMAL LOW (ref 36.0–46.0)
HCT: 24.2 % — ABNORMAL LOW (ref 36.0–46.0)
HCT: 22 % — ABNORMAL LOW (ref 36.0–46.0)
Hemoglobin: 7.5 g/dL — ABNORMAL LOW (ref 12.0–15.0)
Hemoglobin: 8.3 g/dL — ABNORMAL LOW (ref 12.0–15.0)
Hemoglobin: 7.1 g/dL — ABNORMAL LOW (ref 12.0–15.0)
MCH: 29.1 pg (ref 26.0–34.0)
MCH: 28.9 pg (ref 26.0–34.0)
MCH: 27.6 pg (ref 26.0–34.0)
MCHC: 34.2 g/dL (ref 30.0–36.0)
MCHC: 34.3 g/dL (ref 30.0–36.0)
MCHC: 32.3 g/dL (ref 30.0–36.0)
MCV: 84.9 fL (ref 80.0–100.0)
MCV: 84.3 fL (ref 80.0–100.0)
MCV: 85.6 fL (ref 80.0–100.0)
Platelets: 50 10*3/uL — ABNORMAL LOW (ref 150–400)
Platelets: 44 10*3/uL — ABNORMAL LOW (ref 150–400)
Platelets: 88 10*3/uL — ABNORMAL LOW (ref 150–400)
RBC: 2.58 MIL/uL — ABNORMAL LOW (ref 3.87–5.11)
RBC: 2.87 MIL/uL — ABNORMAL LOW (ref 3.87–5.11)
RBC: 2.57 MIL/uL — ABNORMAL LOW (ref 3.87–5.11)
RDW: 16.5 % — ABNORMAL HIGH (ref 11.5–15.5)
RDW: 16.4 % — ABNORMAL HIGH (ref 11.5–15.5)
RDW: 16.8 % — ABNORMAL HIGH (ref 11.5–15.5)
WBC: 7.8 10*3/uL (ref 4.0–10.5)
WBC: 7.7 10*3/uL (ref 4.0–10.5)
WBC: 7.6 10*3/uL (ref 4.0–10.5)
nRBC: 0.3 % — ABNORMAL HIGH (ref 0.0–0.2)
nRBC: 0.3 % — ABNORMAL HIGH (ref 0.0–0.2)
nRBC: 0.4 % — ABNORMAL HIGH (ref 0.0–0.2)

## 2021-06-07 LAB — GLOBAL TEG PANEL
CFF Max Amplitude: 15.6 mm (ref 15–32)
CK with Heparinase (R): 10.6 min — ABNORMAL HIGH (ref 4.3–8.3)
Citrated Functional Fibrinogen: 284.7 mg/dL (ref 278–581)
Citrated Kaolin (K): >5 min — ABNORMAL HIGH (ref 0.8–2.1)
Citrated Kaolin (MA): 47.8 mm — ABNORMAL LOW (ref 52–69)
Citrated Kaolin (R): >17 min — ABNORMAL HIGH (ref 4.6–9.1)
Citrated Kaolin Angle: 42 deg — ABNORMAL LOW (ref 63–78)
Citrated Rapid TEG (MA): 49.7 mm — ABNORMAL LOW (ref 52–70)

## 2021-06-07 LAB — PROTIME-INR
INR: 1.5 — ABNORMAL HIGH (ref 0.8–1.2)
Prothrombin Time: 18.5 seconds — ABNORMAL HIGH (ref 11.4–15.2)

## 2021-06-07 LAB — POCT I-STAT 7, (LYTES, BLD GAS, ICA,H+H)
Acid-Base Excess: 1 mmol/L (ref 0.0–2.0)
Acid-base deficit: 1 mmol/L (ref 0.0–2.0)
Acid-base deficit: 1 mmol/L (ref 0.0–2.0)
Acid-base deficit: 3 mmol/L — ABNORMAL HIGH (ref 0.0–2.0)
Bicarbonate: 22.9 mmol/L (ref 20.0–28.0)
Bicarbonate: 25.1 mmol/L (ref 20.0–28.0)
Bicarbonate: 23.1 mmol/L (ref 20.0–28.0)
Bicarbonate: 22.2 mmol/L (ref 20.0–28.0)
Calcium, Ion: 1.12 mmol/L — ABNORMAL LOW (ref 1.15–1.40)
Calcium, Ion: 1.11 mmol/L — ABNORMAL LOW (ref 1.15–1.40)
Calcium, Ion: 1.15 mmol/L (ref 1.15–1.40)
Calcium, Ion: 1.13 mmol/L — ABNORMAL LOW (ref 1.15–1.40)
HCT: 22 % — ABNORMAL LOW (ref 36.0–46.0)
HCT: 22 % — ABNORMAL LOW (ref 36.0–46.0)
HCT: 19 % — ABNORMAL LOW (ref 36.0–46.0)
HCT: 25 % — ABNORMAL LOW (ref 36.0–46.0)
Hemoglobin: 7.5 g/dL — ABNORMAL LOW (ref 12.0–15.0)
Hemoglobin: 7.5 g/dL — ABNORMAL LOW (ref 12.0–15.0)
Hemoglobin: 6.5 g/dL — CL (ref 12.0–15.0)
Hemoglobin: 8.5 g/dL — ABNORMAL LOW (ref 12.0–15.0)
O2 Saturation: 100 %
O2 Saturation: 100 %
O2 Saturation: 100 %
O2 Saturation: 100 %
Patient temperature: 36.5
Patient temperature: 36.5
Patient temperature: 36.3
Patient temperature: 36.4
Potassium: 4.3 mmol/L (ref 3.5–5.1)
Potassium: 4.1 mmol/L (ref 3.5–5.1)
Potassium: 4.7 mmol/L (ref 3.5–5.1)
Potassium: 4.8 mmol/L (ref 3.5–5.1)
Sodium: 145 mmol/L (ref 135–145)
Sodium: 145 mmol/L (ref 135–145)
Sodium: 144 mmol/L (ref 135–145)
Sodium: 145 mmol/L (ref 135–145)
TCO2: 24 mmol/L (ref 22–32)
TCO2: 26 mmol/L (ref 22–32)
TCO2: 24 mmol/L (ref 22–32)
TCO2: 23 mmol/L (ref 22–32)
pCO2 arterial: 33.5 mmHg (ref 32.0–48.0)
pCO2 arterial: 34.4 mmHg (ref 32.0–48.0)
pCO2 arterial: 33.8 mmHg (ref 32.0–48.0)
pCO2 arterial: 36.6 mmHg (ref 32.0–48.0)
pH, Arterial: 7.44 (ref 7.350–7.450)
pH, Arterial: 7.469 — ABNORMAL HIGH (ref 7.350–7.450)
pH, Arterial: 7.44 (ref 7.350–7.450)
pH, Arterial: 7.388 (ref 7.350–7.450)
pO2, Arterial: 351 mmHg — ABNORMAL HIGH (ref 83.0–108.0)
pO2, Arterial: 401 mmHg — ABNORMAL HIGH (ref 83.0–108.0)
pO2, Arterial: 412 mmHg — ABNORMAL HIGH (ref 83.0–108.0)
pO2, Arterial: 431 mmHg — ABNORMAL HIGH (ref 83.0–108.0)

## 2021-06-07 LAB — GLUCOSE, CAPILLARY
Glucose-Capillary: 127 mg/dL — ABNORMAL HIGH (ref 70–99)
Glucose-Capillary: 166 mg/dL — ABNORMAL HIGH (ref 70–99)
Glucose-Capillary: 110 mg/dL — ABNORMAL HIGH (ref 70–99)
Glucose-Capillary: 114 mg/dL — ABNORMAL HIGH (ref 70–99)
Glucose-Capillary: 116 mg/dL — ABNORMAL HIGH (ref 70–99)
Glucose-Capillary: 132 mg/dL — ABNORMAL HIGH (ref 70–99)
Glucose-Capillary: 134 mg/dL — ABNORMAL HIGH (ref 70–99)
Glucose-Capillary: 136 mg/dL — ABNORMAL HIGH (ref 70–99)
Glucose-Capillary: 138 mg/dL — ABNORMAL HIGH (ref 70–99)
Glucose-Capillary: 138 mg/dL — ABNORMAL HIGH (ref 70–99)
Glucose-Capillary: 140 mg/dL — ABNORMAL HIGH (ref 70–99)
Glucose-Capillary: 141 mg/dL — ABNORMAL HIGH (ref 70–99)
Glucose-Capillary: 143 mg/dL — ABNORMAL HIGH (ref 70–99)
Glucose-Capillary: 148 mg/dL — ABNORMAL HIGH (ref 70–99)
Glucose-Capillary: 158 mg/dL — ABNORMAL HIGH (ref 70–99)

## 2021-06-07 LAB — HEPATIC FUNCTION PANEL
ALT: 53 U/L — ABNORMAL HIGH (ref 0–44)
AST: 212 U/L — ABNORMAL HIGH (ref 15–41)
Albumin: 3.2 g/dL — ABNORMAL LOW (ref 3.5–5.0)
Alkaline Phosphatase: 22 U/L — ABNORMAL LOW (ref 38–126)
Bilirubin, Direct: 0.2 mg/dL (ref 0.0–0.2)
Indirect Bilirubin: 0.5 mg/dL (ref 0.3–0.9)
Total Bilirubin: 0.7 mg/dL (ref 0.3–1.2)
Total Protein: 4.2 g/dL — ABNORMAL LOW (ref 6.5–8.1)

## 2021-06-07 LAB — PREPARE RBC (CROSSMATCH)

## 2021-06-07 LAB — HEPARIN LEVEL (UNFRACTIONATED)
Heparin Unfractionated: 0.11 IU/mL — ABNORMAL LOW (ref 0.30–0.70)
Heparin Unfractionated: 0.16 IU/mL — ABNORMAL LOW (ref 0.30–0.70)
Heparin Unfractionated: 0.27 IU/mL — ABNORMAL LOW (ref 0.30–0.70)

## 2021-06-07 LAB — LACTATE DEHYDROGENASE: LDH: 980 U/L — ABNORMAL HIGH (ref 98–192)

## 2021-06-07 LAB — LACTIC ACID, PLASMA: Lactic Acid, Venous: 1.2 mmol/L (ref 0.5–1.9)

## 2021-06-07 LAB — APTT: aPTT: 189 seconds (ref 24–36)

## 2021-06-07 LAB — FIBRINOGEN: Fibrinogen: 264 mg/dL (ref 210–475)

## 2021-06-07 SURGERY — DEBRIDEMENT, WOUND, STERNUM
Anesthesia: LOCAL

## 2021-06-07 MED ORDER — SODIUM CHLORIDE 0.9% IV SOLUTION: Freq: Once | INTRAVENOUS | Status: DC

## 2021-06-07 MED ORDER — CEFAZOLIN SODIUM-DEXTROSE 2-4 GM/100ML-% IV SOLN
2.0000 g | Freq: Three times a day (TID) | INTRAVENOUS | Status: DC
  Administered 2021-06-07 – 2021-06-11 (×11): 2 g via INTRAVENOUS
  Filled 2021-06-07 (×15): qty 100

## 2021-06-07 SURGICAL SUPPLY — 43 items
BANDAGE ESMARK 6X9 LF (GAUZE/BANDAGES/DRESSINGS) ×1 IMPLANT
BLADE CLIPPER SURG (BLADE) IMPLANT
BLADE SURG 10 STRL SS (BLADE) ×1 IMPLANT
BNDG CMPR 9X6 STRL LF SNTH (GAUZE/BANDAGES/DRESSINGS) ×1
BNDG ESMARK 6X9 LF (GAUZE/BANDAGES/DRESSINGS) ×2
CANISTER SUCT 3000ML PPV (MISCELLANEOUS) ×2 IMPLANT
CNTNR URN SCR LID CUP LEK RST (MISCELLANEOUS) IMPLANT
CONT SPEC 4OZ STRL OR WHT (MISCELLANEOUS)
DRAPE INCISE IOBAN 66X45 STRL (DRAPES) ×2 IMPLANT
DRAPE LAPAROSCOPIC ABDOMINAL (DRAPES) ×2 IMPLANT
DRAPE WARM FLUID 44X44 (DRAPES) IMPLANT
DRSG PAD ABDOMINAL 8X10 ST (GAUZE/BANDAGES/DRESSINGS) IMPLANT
ELECT REM PT RETURN 9FT ADLT (ELECTROSURGICAL)
ELECTRODE REM PT RTRN 9FT ADLT (ELECTROSURGICAL) ×1 IMPLANT
GAUZE SPONGE 4X4 12PLY STRL (GAUZE/BANDAGES/DRESSINGS) ×2 IMPLANT
GLOVE SURG SIGNA 7.5 PF LTX (GLOVE) ×3 IMPLANT
GOWN STRL REUS W/ TWL LRG LVL3 (GOWN DISPOSABLE) ×2 IMPLANT
GOWN STRL REUS W/ TWL XL LVL3 (GOWN DISPOSABLE) ×1 IMPLANT
GOWN STRL REUS W/TWL LRG LVL3 (GOWN DISPOSABLE) ×4
GOWN STRL REUS W/TWL XL LVL3 (GOWN DISPOSABLE) ×2
HANDPIECE INTERPULSE COAX TIP (DISPOSABLE)
HEMOSTAT POWDER SURGIFOAM 1G (HEMOSTASIS) IMPLANT
KIT BASIN OR (CUSTOM PROCEDURE TRAY) ×1 IMPLANT
KIT TURNOVER KIT B (KITS) ×1 IMPLANT
NS IRRIG 1000ML POUR BTL (IV SOLUTION) ×4 IMPLANT
PACK CHEST (CUSTOM PROCEDURE TRAY) ×1 IMPLANT
PAD ARMBOARD 7.5X6 YLW CONV (MISCELLANEOUS) ×2 IMPLANT
SET HNDPC FAN SPRY TIP SCT (DISPOSABLE) IMPLANT
SPONGE LAP 18X18 RF (DISPOSABLE) ×4 IMPLANT
SUT STEEL 6MS V (SUTURE) IMPLANT
SUT STEEL STERNAL CCS#1 18IN (SUTURE) IMPLANT
SUT STEEL SZ 6 DBL 3X14 BALL (SUTURE) IMPLANT
SUT VIC AB 1 CTX 36 (SUTURE)
SUT VIC AB 1 CTX36XBRD ANBCTR (SUTURE) ×2 IMPLANT
SUT VIC AB 2-0 CTX 27 (SUTURE) ×2 IMPLANT
SUT VIC AB 3-0 X1 27 (SUTURE) ×2 IMPLANT
SWAB COLLECTION DEVICE MRSA (MISCELLANEOUS) IMPLANT
SWAB CULTURE ESWAB REG 1ML (MISCELLANEOUS) IMPLANT
SYR 5ML LL (SYRINGE) IMPLANT
TOWEL GREEN STERILE (TOWEL DISPOSABLE) ×2 IMPLANT
TOWEL GREEN STERILE FF (TOWEL DISPOSABLE) ×2 IMPLANT
TRAY FOLEY MTR SLVR 14FR STAT (SET/KITS/TRAYS/PACK) IMPLANT
WATER STERILE IRR 1000ML POUR (IV SOLUTION) ×1 IMPLANT

## 2021-06-07 NOTE — Progress Notes (Signed)
ECMO PROGRESS NOTE  NAME:  [name] [name], MRN:  [id number], DOB:  Apr 23, 1961, LOS: 2 ADMISSION DATE:  06/02/2021, CONSULTATION DATE: 06/14/2021 REFERRING MD: Roxan Hockey, CHIEF COMPLAINT: VA ECMO  HPI/course in hospital   60 year old woman with hx of HTN, HLD, hypothyroidism, CVA, DM2 who presented with angina.  Underwent cardiac cath which showed left main dissection, during this procedure became acutely unstable with crushing chest pain and shock.  Taken emergently to OR for bypass after 3-5 impella placed.  Tough to wean from bypass so left centrally cannulated on VA ECMO and sent to [place].  PCCM consulted to help with management.  Past Medical History   Past Medical History:  Diagnosis Date   Arthritis    Brain aneurysm    Diabetes mellitus without complication (HCC)    Headache    Hypertension        SVT (supraventricular tachycardia) ([place])    s/p radiofrequency catheter ablation for AVNRT 02/16/09 (Dr. Cristopher Peru)   Thyroid disease    PROCEDURE FOR THYROID 15 YRS AGO AT [facility]   WPW (Wolff-Parkinson-White syndrome)      Past Surgical History:  Procedure Laterality Date   BREAST SURGERY     BX RT BREAST  BENIGN   CORONARY ARTERY BYPASS GRAFT N/A 06/14/2021   Procedure: CORONARY ARTERY BYPASS GRAFTING (CABG)X2.USING  LEFT INTERNAL MAMMARY ARTERY AND  RIGHT ENDOSCOPIC SAPHENOUS VEIN HARVESTING. ECMO INSERTION;  Surgeon: Melrose Nakayama, MD;  Location: [facility];  Service: Open Heart Surgery;  Laterality: N/A;   CRANIOTOMY N/A 01/04/2016   Procedure: Suboccipital Craniotomy and Cervical one Laminectomy for Clipping of Aneurysm;  Surgeon: Kevan Ny Ditty, MD;  Location: [facility];  Service: Neurosurgery;  Laterality: N/A;   CRANIOTOMY N/A 07/09/2016   Procedure: Craniotomy for clipping of left middle cerebral artery aneurysm;  Surgeon: Kevan Ny Ditty, MD;  Location: [facility];  Service: Neurosurgery;  Laterality: N/A;  Craniotomy for clipping of left middle  cerebral artery aneurysm   CRANIOTOMY Right 10/24/2016   Procedure: 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 Christella Noa;  Surgeon: Kevan Ny Ditty, MD;  Location: [facility];  Service: Neurosurgery;  Laterality: Right;   ELECTROPHYSIOLOGIC STUDY     LAPAROSCOPIC REVISION VENTRICULAR-PERITONEAL (V-P) SHUNT N/A 02/04/2016   Procedure: LAPAROSCOPIC Insertion VENTRICULAR-PERITONEAL (V-P) SHUNT;  Surgeon: Rolm Bookbinder, MD;  Location: [facility];  Service: General;  Laterality: N/A;   LEFT HEART CATH AND CORONARY ANGIOGRAPHY N/A 06/20/2021   Procedure: LEFT HEART CATH AND CORONARY ANGIOGRAPHY;  Surgeon: Jettie Booze, MD;  Location: [facility];  Service: Cardiovascular;  Laterality: N/A;   RADIOLOGY WITH ANESTHESIA N/A 01/03/2016   Procedure: RADIOLOGY WITH ANESTHESIA;  Surgeon: Medication Radiologist, MD;  Location: [facility];  Service: Radiology;  Laterality: N/A;   TEE WITHOUT CARDIOVERSION N/A 06/16/2021   Procedure: TRANSESOPHAGEAL ECHOCARDIOGRAM (TEE);  Surgeon: Melrose Nakayama, MD;  Location: [facility];  Service: Open Heart Surgery;  Laterality: N/A;   VENTRICULAR ASSIST DEVICE INSERTION N/A 05/28/2021   Procedure: VENTRICULAR ASSIST DEVICE INSERTION;  Surgeon: Jettie Booze, MD;  Location: [facility];  Service: Cardiovascular;  Laterality: N/A;   VENTRICULOPERITONEAL SHUNT Right 02/04/2016   Procedure: SHUNT INSERTION VENTRICULAR-PERITONEAL With Laparoscopic Assistance;  Surgeon: Kevan Ny Ditty, MD;  Location: [facility];  Service: Neurosurgery;  Laterality: Right;     Significant Hospital events:  6/14 admitted 6/15 spontaneous artery  dissection left main on cath.CP Impella placed.   6/15 emergent coronary artery bypass graft, LIMA to LAD, SVG to OM.  Brief cardiac arrest of less than 10 minutes immediately prior to initiation of cardiopulmonary bypass. 6/15 unable to wean.  Still unable to wean from bypass so placed on  centrally cannulated VA ECMO. 6/16 EEG shows encephalopathy.   Interim history/subjective:  Over diuresis yesterday associated with hypotension and chatter. Fluid returned. Concern for cold right hand.  Seen by Vascular surgery.   Objective   Blood pressure (!) 87/73, pulse 90, temperature 97.7 F (36.5 C), resp. rate 15, height _0  (1.6 m), weight 93.1 kg, SpO2 100 %. PAP: (13-21)/(7-14) 16/9 CVP:  [4 mmHg] 4 mmHg  Vent Mode: PRVC FiO2 (%):  [30 %] 30 % Set Rate:  [15 bmp] 15 bmp Vt Set:  [310 mL] 310 mL PEEP:  [5 cmH20] 5 cmH20 Plateau Pressure:  [15 cmH20] 15 cmH20   Intake/Output Summary (Last 24 hours) at 06/03/2021 0853 Last data filed at 05/27/2021 0800 Gross per 24 hour  Intake 5528.61 ml  Output 6495 ml  Net -966.39 ml    Filed Weights   06/20/2021 1256 06/06/21 0500 06/18/2021 0500  Weight: 89.5 kg 94.7 kg 93.1 kg    ECMO Device: Cardiohelp  ECMO Mode: VA  Flow (LPM): 3.44   Physical Exam Constitutional:      Appearance: She is obese.  HENT:     Head: Normocephalic.  Eyes:     Comments: sclerae intact Neck:     Comments: ET tube and OG tube in place Cardiovascular:     Rate and Rhythm: Normal rate and regular rhythm.     Heart sounds: Normal heart sounds.     Comments: Open chest with central ECMO cannulas.  All extremities warm. Pulmonary:     Breath sounds: Normal breath sounds.     Comments: Mechanically ventilated with acceptable airway pressures. Chest pooling serosanguinous fluid.  Abdominal:     Palpations: Abdomen is soft.  Skin:    General: Skin is warm and dry.     Capillary Refill: Capillary refill takes 2 to 3 seconds.  Neurological:     Comments: Sedated.  Moves all limbs spontaneously and will open eyes to voice.   POC echo shows limited views. LV function is remains poor.   Ancillary tests (personally reviewed)  CBC: Recent Labs  Lab 06/03/2021 0450 05/25/2021 1506 06/03/2021 1630 06/11/2021 1644 06/02/2021 2055 06/06/21 0012  06/06/21 0402 06/06/21 0605 06/06/21 1557 06/06/21 1810 06/06/21 2015 05/27/2021 0232 06/02/2021 0237  WBC 8.2  --   --   --  11.3*  --  9.9  --   --  8.2  --  7.8  --   HGB 12.2   < > 8.2*   < > 12.2  12.3   < > 10.9*   < > 8.8* 7.8* 6.8* 7.5* 7.5*  HCT 38.8   < > 25.5*   < > 36.0  37.2   < > 33.7*   < > 26.0* 23.9* 20.0* 21.9* 22.0*  MCV 90.2  --   --   --  88.2  --  88.0  --   --  86.6  --  84.9  --   PLT 232  --  120*  --  104*  --  99*  --   --  67*  --  50*  --    < > = values in this interval not displayed.  Basic Metabolic Panel: Recent Labs  Lab 06/14/2021 0450 06/18/2021 1506 06/03/2021 1845 05/31/2021 1907 05/28/2021 2055 06/06/21 0012 06/06/21 0402 06/06/21 3343 06/06/21 1546 06/06/21 1557 06/06/21 2015 06/06/2021 0232 05/27/2021 0237  NA 139   < > 141   < > 144  142   < > 139   < > 141 144 146* 142 145  K 3.8   < > 3.8   < > 3.1*  2.9*   < > 3.4*   < > 3.7 3.8 3.7 4.3 4.3  CL 110   < > 116*  --  112*  --  112*  --  113*  --   --  114*  --   CO2 23  --   --   --  17*  --  17*  --  20*  --   --  22  --   GLUCOSE 81   < > 208*  --  207*  --  251*  --  178*  --   --  177*  --   BUN 16   < > 16  --  13  --  14  --  14  --   --  14  --   CREATININE 1.50*   < > 1.40*  --  1.76*  --  1.91*  --  1.90*  --   --  1.93*  --   CALCIUM 8.9  --   --   --  7.4*  --  7.3*  --  7.4*  --   --  7.6*  --   MG 1.9  --   --   --  1.8  --  2.6*  --  2.0  --   --   --   --    < > = values in this interval not displayed.    GFR: Estimated Creatinine Clearance: 33.6 mL/min (A) (by C-G formula based on SCr of 1.93 mg/dL (H)). Recent Labs  Lab 06/19/2021 2055 06/06/21 0402 06/06/21 1810 06/02/2021 0232  WBC 11.3* 9.9 8.2 7.8  LATICACIDVEN 7.5* 5.1*  --  1.2     Liver Function Tests: Recent Labs  Lab 05/28/2021 2055 06/06/21 0402 06/03/2021 0232  AST 889* 771* 212*  ALT 290* 221* 53*  ALKPHOS 25* 26* 22*  BILITOT 0.7 0.7 0.7  PROT 3.6* 3.8* 4.2*  ALBUMIN 2.2* 2.6* 3.2*    No  results for input(s): LIPASE, AMYLASE in the last 168 hours. No results for input(s): AMMONIA in the last 168 hours.  ABG    Component Value Date/Time   PHART 7.440 05/29/2021 0237   PCO2ART 33.5 05/28/2021 0237   PO2ART 351 (H) 05/25/2021 0237   HCO3 22.9 06/15/2021 0237   TCO2 24 06/11/2021 0237   ACIDBASEDEF 1.0 06/01/2021 0237   O2SAT 100.0 06/01/2021 0237      Coagulation Profile: Recent Labs  Lab 06/06/2021 2230 06/06/21 0402 06/06/2021 0232  INR 1.6* 1.7* 1.5*     Cardiac Enzymes: No results for input(s): CKTOTAL, CKMB, CKMBINDEX, TROPONINI in the last 168 hours.  HbA1C: Hgb A1c MFr Bld  Date/Time Value Ref Range Status  05/31/2021 10:03 AM 5.4 4.8 - 5.6 % Final    Comment:    (NOTE)         Prediabetes: 5.7 - 6.4         Diabetes: >6.4         Glycemic control for adults with diabetes: <7.0     CBG: Recent  Labs  Lab 06/06/21 2011 06/06/21 2135 06/06/21 2318 06/19/2021 0059 06/02/2021 0233  GLUCAP 141* 147* 153* 127* 166*    Assessment & Plan:  Critically ill due to cardiogenic shock secondary to spontaneous coronary dissection of the left main requiring VA ECMO support and titration of vasopressors and inotropes. CP Impella to offload LV. Status post two-vessel bypass AKI due to hypotension - creatinine leveling off Shock liver due to hypotension - improving Multifactorial thrombocytopenia.  Status post PEA cardiac arrest Uncontrolled type 2 diabetes  Plan:  -Titrate norepinephrine to keep MAP greater than 65 -Continue epinephrine and milrinone at current doses. -For bedside washout.  -Maintain current ECMO flow.  Observe for spontaneous recovery and return of pulsatility. -Continue rest ventilator settings.  Acceptable gas exchange. -TEG today, PLT on hold for washout.  -Anticipate shorter ECMO run with possible decannulation and chest closure in 48 to 72 hours.  Daily Goals Checklist  Pain/Anxiety/Delirium protocol (if indicated): Dilaudid  and dexmedetomidine to keep RASS goal -3 VAP protocol (if indicated): Bundle in place Respiratory support goals: Rest ventilator settings.  No weaning at this time. Blood pressure target: MAP greater than 65, normal cardiac output. DVT prophylaxis: On systemic heparin for VA ECMO Nutritional status and feeding goals: Initiate trickle feeds.  Consider increase to goal tomorrow. Paused feeds for washout.  GI prophylaxis: Pantoprazole.  Fluid status goals: No diuresis today  Urinary catheter: Assessment of intravascular volume Central lines: Right IJ MAC introducer, left radial art line Glucose control: Uncontrolled type 2 diabetes on insulin infusion, adjust per Endo tool. Mobility/therapy needs: Bedrest Antibiotic de-escalation: Perioperative antibiotics Home medication reconciliation: On hold Daily labs: Per ECMO protocol Code Status: Full code Family Communication: Family to be updated over the phone.  Disposition: ICU  CRITICAL CARE Performed by: Kipp Brood   Total critical care time: 45 minutes  Critical care time was exclusive of separately billable procedures and treating other patients.  Critical care was necessary to treat or prevent imminent or life-threatening deterioration.  Critical care was time spent personally by me on the following activities: development of treatment plan with patient and/or surrogate as well as nursing, discussions with consultants, evaluation of patient's response to treatment, examination of patient, obtaining history from patient or surrogate, ordering and performing treatments and interventions, ordering and review of laboratory studies, ordering and review of radiographic studies, pulse oximetry, re-evaluation of patient's condition and participation in multidisciplinary rounds.  Kipp Brood, MD [facility] ICU Physician [place]  Pager: [phone] Mobile: [phone] After hours: [phone].   06/19/2021, 8:53  AM

## 2021-06-07 NOTE — Progress Notes (Signed)
CardioHelp reading post bedside chest exploration and washout.

## 2021-06-07 NOTE — Progress Notes (Signed)
[name] [name] for IV Heparin Indication:  ECMO and Impella  No Known Allergies  Patient Measurements: Height: 5\' 3"  (160 cm) Weight: 93.1 kg (205 lb 4 oz) IBW/kg (Calculated) : 52.4 Heparin Dosing Weight: 72 kg  Vital Signs: Temp: 97.7 F (36.5 C) (06/17 0800) Temp Source: Core (06/17 0700) BP: 87/73 (06/17 0723) Pulse Rate: 90 (06/17 0800)  Labs: Recent Labs    05/26/2021 2033 05/26/2021 2234 06/06/2021 0006 06/01/2021 0450 06/19/2021 2230 06/06/21 0012 06/06/21 0402 06/06/21 0510 06/06/21 1131 06/06/21 1132 06/06/21 1546 06/06/21 1557 06/06/21 1810 06/06/21 2015 06/06/21 2130 06/19/2021 0230 05/24/2021 0232 05/30/2021 0237 05/29/2021 0919  HGB  --   --   --    < >  --    < > 10.9*   < >  --    < >  --    < > 7.8* 6.8*  --   --  7.5* 7.5*  --   HCT  --   --   --    < >  --    < > 33.7*   < >  --    < >  --    < > 23.9* 20.0*  --   --  21.9* 22.0*  --   PLT  --   --   --    < >  --   --  99*  --   --   --   --   --  67*  --   --   --  50*  --   --   APTT  --   --   --    < > >200*  --  >200*  --  >200*  --  >200*  --   --   --   --   --   --   --   --   LABPROT  --   --   --   --  19.2*  --  19.6*  --   --   --   --   --   --   --   --   --  18.5*  --   --   INR  --   --   --   --  1.6*  --  1.7*  --   --   --   --   --   --   --   --   --  1.5*  --   --   HEPARINUNFRC  --   --  0.97*   < >  --    < >  --    < > 0.77*  --  0.32  --   --   --  0.12* 0.11*  --   --  0.16*  CREATININE  --   --   --    < >  --   --  1.91*  --   --   --  1.90*  --   --   --   --   --  1.93*  --   --   TROPONINIHS 1,070* 1,455* 1,903*  --   --   --   --   --   --   --   --   --   --   --   --   --   --   --   --    < > = values in this interval not displayed.  Estimated Creatinine Clearance: 33.6 mL/min (A) (by C-G formula based on SCr of 1.93 mg/dL (H)).   Medical History: Past Medical History:  Diagnosis Date   Arthritis    Brain aneurysm    Diabetes  mellitus without complication (HCC)    Headache    Hypertension        SVT (supraventricular tachycardia) ([facility])    s/p radiofrequency catheter ablation for AVNRT 02/16/09 (Dr. Cristopher Peru)   Thyroid disease    PROCEDURE FOR THYROID 15 YRS AGO AT [facility]   WPW (Wolff-Parkinson-White syndrome)     Medications:  Infusions:   sodium chloride 10 mL/hr at 05/29/2021 0900   sodium chloride     sodium chloride     sodium chloride     sodium chloride     sodium chloride 10 mL/hr at 06/09/2021 1000   sodium chloride     albumin human 12.5 g (06/06/21 2018)   amiodarone 30 mg/hr (06/06/2021 1000)    ceFAZolin (ANCEF) IV     dexmedetomidine (PRECEDEX) IV infusion 0.7 mcg/kg/hr (05/27/2021 1000)   epinephrine 6 mcg/min (06/16/2021 1000)   feeding supplement (PIVOT 1.5 CAL) Stopped (06/15/2021 0900)   heparin 25 units/mL (Impella PURGE) in dextrose 5 % 1000 mL bag     heparin 250 Units/hr (06/11/2021 1000)   HYDROmorphone 8 mg/hr (06/19/2021 1000)   insulin 1.8 Units/hr (05/29/2021 1000)   lactated ringers     lactated ringers 10 mL/hr at 06/08/2021 1000   lactated ringers     milrinone 0.25 mcg/kg/min (05/28/2021 1000)   norepinephrine (LEVOPHED) Adult infusion 9 mcg/min (05/27/2021 1000)    Assessment:  25 yoF admitted with cardiogenic shock 2/2 LM SCAD. Pt s/p CABG with VA-ECMO and Impella placement. Heparin started systemically and via purge (50 units/ml). Heparin levels elevated on purge alone so concentration reduced to half-strength (25 units/ml).  Heparin level subtherapeutic at 0.16, aPTT remains elevated for unclear reasons but is trending down (?liver dysfunction). Purge infusion volume stable (15.8 ml/h). R hand hematoma is improved. Fibrinogen wnl and LDH normalizing. H/H remains low, pltc 50k this morning - pRBCs ordered. No active S/Sx bleeding.   Goal of Therapy:  Heparin level 0.3 to 0.5 units/ml Monitor platelets by anticoagulation protocol: Yes   Plan:  -Continue half-strength heparinized  purge (25 units/ml) - will need to consider increasing to full-strength soon if heparin levels remain low -Increase systemic heparin to 400 units/h -Continue q6h heparin level checks until therapeutic    [name], PharmD, BCPS, [facility] Pharmacist [phone] Please check AMION for all [facility] numbers 06/06/2021

## 2021-06-07 NOTE — Brief Op Note (Addendum)
05/25/2021 - 06/03/2021  12:45 PM  PATIENT:  [name] [name]  60 y.o. female  PRE-OPERATIVE DIAGNOSIS:  Bleeding on ECMO  POST-OPERATIVE DIAGNOSIS:  Bleeding on ECMO  PROCEDURE:  Procedure(s): Reexploration and evacuation of hematoma (N/A)  SURGEON:  Surgeon(s) and Role:    * Melrose Nakayama, MD - Primary  PHYSICIAN ASSISTANT: none  ASSISTANTS: Alcide Evener, RNFA   ANESTHESIA:   IV sedation  EBL:  minimal   BLOOD ADMINISTERED: 1 unit PLTS  DRAINS:  2 mediastinal and 1 left pleural in place    LOCAL MEDICATIONS USED:  NONE  SPECIMEN:  No Specimen  DISPOSITION OF SPECIMEN:  N/A  COUNTS:  YES  TOURNIQUET:  * No tourniquets in log *  DICTATION: .Other Dictation: Dictation Number -  PLAN OF CARE:  Already inpatient  PATIENT DISPOSITION:   ICU on ECMO   Delay start of Pharmacological VTE agent (>24hrs) due to surgical blood loss or risk of bleeding: no  FINDINGS: About 750 ml of blood and clot in mediastinum and mostly left pleural space.

## 2021-06-07 NOTE — Progress Notes (Signed)
[facility] [place] PROGRESS NOTE  ASSESSMENT / PLAN: [name] [name] is a 60 y.o. female with improved right upper extremity after trans-ulnar catheterization. Her exam is reassuring this morning with good doppler flow in the right radial, ulnar, and palmar arch arteries. No intervention necessary. Please call for questions.   SUBJECTIVE: Improved warmth in right hand overnight.   OBJECTIVE: BP (!) 74/69   Pulse 95   Temp 97.7 F (36.5 C)   Resp 16   Ht 5\' 3"  (1.6 m)   Wt 93.1 kg   SpO2 100%   BMI 36.36 kg/m   Intake/Output Summary (Last 24 hours) at 06/03/2021 0741 Last data filed at 06/03/2021 0700 Gross per 24 hour  Intake 5585.58 ml  Output 6695 ml  Net -1109.42 ml    Critically ill Open chest ECMO Biphasic doppler flow in radial artery, ulnar artery, palmar arch  CBC Latest Ref Rng & Units 06/11/2021 06/14/2021 06/06/2021  WBC 4.0 - 10.5 K/uL - 7.8 -  Hemoglobin 12.0 - 15.0 g/dL 7.5(L) 7.5(L) 6.8(LL)  Hematocrit 36.0 - 46.0 % 22.0(L) 21.9(L) 20.0(L)  Platelets 150 - 400 K/uL - 50(L) -     CMP Latest Ref Rng & Units 06/03/2021 06/03/2021 06/06/2021  Glucose 70 - 99 mg/dL - 177(H) -  BUN 6 - 20 mg/dL - 14 -  Creatinine 0.44 - 1.00 mg/dL - 1.93(H) -  Sodium 135 - 145 mmol/L 145 142 146(H)  Potassium 3.5 - 5.1 mmol/L 4.3 4.3 3.7  Chloride 98 - 111 mmol/L - 114(H) -  CO2 22 - 32 mmol/L - 22 -  Calcium 8.9 - 10.3 mg/dL - 7.6(L) -  Total Protein 6.5 - 8.1 g/dL - 4.2(L) -  Total Bilirubin 0.3 - 1.2 mg/dL - 0.7 -  Alkaline Phos 38 - 126 U/L - 22(L) -  AST 15 - 41 U/L - 212(H) -  ALT 0 - 44 U/L - 53(H) -    Estimated Creatinine Clearance: 33.6 mL/min (A) (by C-G formula based on SCr of 1.93 mg/dL (H)).  [name] [name]. [name] Breed, MD [facility] Phone Number: [phone] 06/01/2021 7:41 AM

## 2021-06-07 NOTE — Progress Notes (Signed)
2 Days Post-Op Procedure(s) (LRB): CORONARY ARTERY BYPASS GRAFTING (CABG)X2.USING  LEFT INTERNAL MAMMARY ARTERY AND  RIGHT ENDOSCOPIC SAPHENOUS VEIN HARVESTING. ECMO INSERTION (N/A) TRANSESOPHAGEAL ECHOCARDIOGRAM (TEE) (N/A) Subjective: Intubated, sedated  Objective: Vital signs in last 24 hours: Temp:  [97.5 F (36.4 C)-97.7 F (36.5 C)] 97.52 F (36.4 C) (06/17 0629) Pulse Rate:  [71-102] 100 (06/17 0629) Cardiac Rhythm: Normal sinus rhythm (06/16 1930) Resp:  [15-16] 16 (06/16 1942) BP: (74-77)/(69-73) 74/69 (06/16 1543) SpO2:  [99 %-100 %] 100 % (06/17 0629) Arterial Line BP: (67-101)/(61-78) 94/75 (06/17 0629) FiO2 (%):  [30 %] 30 % (06/17 0600) Weight:  [93.1 kg] 93.1 kg (06/17 0500)  Hemodynamic parameters for last 24 hours: PAP: (13-21)/(7-14) 21/14  Intake/Output from previous day: 06/16 0701 - 06/17 0700 In: 5415.7 [I.V.:2437.9; Blood:682.5; NG/GT:376.7; IV Piggyback:1567.2] Out: 1157 [Urine:5725; Emesis/NG output:80; Chest Tube:830] Intake/Output this shift: No intake/output data recorded.  General appearance: criticaly ill Neurologic: upward gaze, pupils small Heart: regular rate and rhythm Lungs: cioarse BS bilaterally Extremities: edema, well perfused Wound: esmark in place  Lab Results: Recent Labs    06/06/21 1810 06/06/21 2015 06/09/2021 0232 05/31/2021 0237  WBC 8.2  --  7.8  --   HGB 7.8*   < > 7.5* 7.5*  HCT 23.9*   < > 21.9* 22.0*  PLT 67*  --  50*  --    < > = values in this interval not displayed.   BMET:  Recent Labs    06/06/21 1546 06/06/21 1557 06/03/2021 0232 06/01/2021 0237  NA 141   < > 142 145  K 3.7   < > 4.3 4.3  CL 113*  --  114*  --   CO2 20*  --  22  --   GLUCOSE 178*  --  177*  --   BUN 14  --  14  --   CREATININE 1.90*  --  1.93*  --   CALCIUM 7.4*  --  7.6*  --    < > = values in this interval not displayed.    PT/INR:  Recent Labs    05/26/2021 0232  LABPROT 18.5*  INR 1.5*   ABG    Component Value Date/Time    PHART 7.440 05/30/2021 0237   HCO3 22.9 05/31/2021 0237   TCO2 24 06/19/2021 0237   ACIDBASEDEF 1.0 06/16/2021 0237   O2SAT 100.0 05/22/2021 0237   CBG (last 3)  Recent Labs    06/06/21 2318 05/22/2021 0059 06/02/2021 0233  GLUCAP 153* 127* 166*    Assessment/Plan: S/P Procedure(s) (LRB): CORONARY ARTERY BYPASS GRAFTING (CABG)X2.USING  LEFT INTERNAL MAMMARY ARTERY AND  RIGHT ENDOSCOPIC SAPHENOUS VEIN HARVESTING. ECMO INSERTION (N/A) TRANSESOPHAGEAL ECHOCARDIOGRAM (TEE) (N/A) Remains critically ill NEURO- EEG yesterady showed diffuse encephalopathy, exam unchanged  CV- on ECMO- flow 3.4 L/min and Impella flow 1.0-1.2 L/ min  L main dissection with STEMI, cardiogenic shock and cardiac arrest   RESP- On ECMO support, white out left lung on CXR, asymmetric edema likely but may also have some effusion- does have a left pleural tube in place  RENAL- creatinine stable at 1.9  Diuresing well but only slightly negative  ENDO- CBG reasonably controlled  Gi- will need to address nutrition soon  Anemia- transfuse as necessary  Thrombocytopenia  Remains critically ill with guarded prognosis  LOS: 2 days    [name] [name] 06/19/2021

## 2021-06-07 NOTE — Progress Notes (Signed)
[place] for IV Heparin Indication:  ECMO and Impella  No Known Allergies  Patient Measurements: Height: 5\' 3"  (160 cm) Weight: 94.7 kg (208 lb 12.4 oz) IBW/kg (Calculated) : 52.4 Heparin Dosing Weight: 72 kg  Vital Signs: Temp: 97.5 F (36.4 C) (06/17 0215) Temp Source: Core (Comment) (06/16 2300) Pulse Rate: 89 (06/17 0215)  Labs: Recent Labs    06/09/2021 2033 05/29/2021 2234 06/20/2021 0006 06/20/2021 0450 05/27/2021 2230 06/06/21 0012 06/06/21 0402 06/06/21 0510 06/06/21 1131 06/06/21 1132 06/06/21 1546 06/06/21 1557 06/06/21 1810 06/06/21 2015 06/06/21 2130 06/06/2021 0230 06/06/2021 0232 06/01/2021 0237  HGB  --   --   --    < >  --    < > 10.9*   < >  --    < >  --    < > 7.8* 6.8*  --   --  7.5* 7.5*  HCT  --   --   --    < >  --    < > 33.7*   < >  --    < >  --    < > 23.9* 20.0*  --   --  21.9* 22.0*  PLT  --   --   --    < >  --   --  99*  --   --   --   --   --  67*  --   --   --  50*  --   APTT  --   --   --    < > >200*  --  >200*  --  >200*  --  >200*  --   --   --   --   --   --   --   LABPROT  --   --   --   --  19.2*  --  19.6*  --   --   --   --   --   --   --   --   --  18.5*  --   INR  --   --   --   --  1.6*  --  1.7*  --   --   --   --   --   --   --   --   --  1.5*  --   HEPARINUNFRC  --   --  0.97*   < >  --    < >  --    < > 0.77*  --  0.32  --   --   --  0.12* 0.11*  --   --   CREATININE  --   --   --    < >  --   --  1.91*  --   --   --  1.90*  --   --   --   --   --  1.93*  --   TROPONINIHS 1,070* 1,455* 1,903*  --   --   --   --   --   --   --   --   --   --   --   --   --   --   --    < > = values in this interval not displayed.     Estimated Creatinine Clearance: 33.9 mL/min (A) (by C-G formula based on SCr of 1.93 mg/dL (H)).   Medical History: Past Medical History:  Diagnosis Date  Arthritis    Brain aneurysm    Diabetes mellitus without complication (HCC)    Headache    Hypertension         SVT (supraventricular tachycardia) (HCC)    s/p radiofrequency catheter ablation for AVNRT 02/16/09 (Dr. Cristopher Peru)   Thyroid disease    PROCEDURE FOR THYROID 15 YRS AGO AT [facility]   WPW (Wolff-Parkinson-White syndrome)     Medications:  Infusions:   sodium chloride 10 mL/hr at 06/06/2021 0200   sodium chloride     sodium chloride     sodium chloride     sodium chloride     sodium chloride 10 mL/hr at 06/06/21 1000   sodium chloride     albumin human 12.5 g (06/06/21 2018)   amiodarone 30 mg/hr (06/09/2021 0200)    ceFAZolin (ANCEF) IV Stopped (06/06/21 2339)   dexmedetomidine (PRECEDEX) IV infusion 0.7 mcg/kg/hr (06/03/2021 0235)   epinephrine 6 mcg/min (06/18/2021 0200)   feeding supplement (PIVOT 1.5 CAL) 20 mL/hr at 05/24/2021 0100   heparin 25 units/mL (Impella PURGE) in dextrose 5 % 1000 mL bag     heparin 100 Units/hr (06/11/2021 0200)   HYDROmorphone 8 mg/hr (06/11/2021 0200)   insulin 3.6 Units/hr (05/28/2021 0242)   lactated ringers     lactated ringers 10 mL/hr at 05/28/2021 0200   lactated ringers     milrinone 0.25 mcg/kg/min (05/28/2021 0200)   norepinephrine (LEVOPHED) Adult infusion 8 mcg/min (06/03/2021 0200)    Assessment: 60 years of age female s/p CABG x2 on VA ECMO with Impella to start IV heparin per pharmacy dosing protocol.   ACT low at 140-starting Impella purge at 50 units/mL.  Prior Heparin levels were elevated on rate of 800 units/hr prior to procedures.   Paltets 120 >>104. Hgb 12.3 after 2 units PRBCs in OR.  6/17 AM update:  Impella heparin currently running at 15.8 mL/hr (25 units/mL) providing 395 units/hr plus systemic heparin running at 100 units/hr for a total of 495 units/hr  Heparin level is elevated low at 0.11 Hgb 6.8>>>7.5   Goal of Therapy:  Heparin level 0.3 to 0.5 units/ml Monitor platelets by anticoagulation protocol: Yes   Plan:  Inc systemic heparin slightly to 250 units/hr Cont heparin purge at 15.8 mL/hr (395 units/hr) Re-check heparin  level in 6 hours  Narda Bonds, PharmD, [place] Pharmacist Phone: [phone]

## 2021-06-07 NOTE — Progress Notes (Signed)
ECMO NOTE:   Indication: Cardiogenic shock    Initial cannulation date: 06/15/2021   ECMO type: VA ECMO   Cannulation:   Central cannulation Asc Ao & RA   ECMO events:   - Initial cannulation 05/23/2021   Daily data:   Flow 3.5L RPM 2800 Sweep  4.0 L   Labs:   ABG    Component Value Date/Time   PHART 7.440 06/20/2021 0237   PCO2ART 33.5 05/23/2021 0237   PO2ART 351 (H) 06/03/2021 0237   HCO3 22.9 05/29/2021 0237   TCO2 24 06/01/2021 0237   ACIDBASEDEF 1.0 05/25/2021 0237   O2SAT 100.0 05/25/2021 0237    Hgb 7.5 -> transfused Platelets 104 -> 99 -> 50 LDH  1876 -> 980 Heparin level: 0.16 Lactic acid 7.5 -> 5.1 -> 1.2    Plan:  - Continue VA ECMO with Impella vent at P-1 (waveforms ok) - Titrate gtts to MAP 70-90 - Adjust heparin with HL goal 0.3-0.5 - Transfuse to keep Hgb >8.0 - Chest washout today - Continue support over the w/e  Glori Bickers, MD  9:57 AM

## 2021-06-07 NOTE — Progress Notes (Signed)
  Evening ICU Rounds   Underwent sternal washout this afternoon with about 900cc of bloody material out. Post-washout pulsativity improved then dropped. Flow on Impella dropped to 0.5L   Hgb down to 7.1. Patient trasfused 2u pRBCs and flows much improved.  Patient awake this afternoon and followed commands for RNs.   Flow on ECMO stable. MAPS 80-90s  Last gas  7.38/37/431/100%  SCR down to 1.7  Continue ECMO support through the weekend with no attempts to wean. Likely TEE Sunday or Monday,   Additional CCT 67min  Glori Bickers, MD  10:02 PM

## 2021-06-07 NOTE — Progress Notes (Signed)
Nutrition Follow-up  DOCUMENTATION CODES:   Not applicable  INTERVENTION:   Tube Feeding via OG: After procedure today, resume Pivot 1.5 at 20 ml/hr, recommend increase by 10 ml every 4 hours to goal rate of of 55 ml/hr. Provides 1980 kcals, 124 g of protein and 1003 mL of free water daily.  NUTRITION DIAGNOSIS:   Inadequate oral intake related to acute illness as evidenced by NPO status.  Being addressed via TF   GOAL:   Patient will meet greater than or equal to 90% of their needs  Progressing  MONITOR:   Vent status, TF tolerance, Labs, Weight trends  REASON FOR ASSESSMENT:   Consult, Ventilator Enteral/tube feeding initiation and management (ECMO)  ASSESSMENT:   60 yo female admitted with chest pain with STEMI requiring emergent CABG, cardiogenic shock in setting of left main dissection requiring VA ECMO, intubation PMH includes DM, HTN, HLD, CVA  6/14 Admitted 6/15 Cannulated for VA ECMO, Intubated  Remains on VA ECMO. Impella in place.  Remains on ventilator support MV: 5.1 L/min Temp (24hrs), Avg:97.6 F (36.4 C), Min:97.5 F (36.4 C), Max:97.7 F (36.5 C)   Pivot 1.5 TF initiated at 20 ml/h yesterday. Tolerated well. TF currently on hold for washout procedure today. Plans to resume trickle feedings after procedure and start advancement tomorrow per discussion with ECMO RN.  Labs reviewed.  CBG: 127-166   Medications reviewed and include insulin drip, KCl, epinephrine, hydromorphone, precedex, levophed.  IVF: LR at 10 ml/h; NS at 10 ml/h  Weight 93.1 kg today Admission weight 87.1 kg  I/O +3.6 L since admission  Chest tube output 830 ml x 24 hours UOP 5785 ml x 24 hours  Diet Order:   Diet Order             Diet NPO time specified  Diet effective now                   EDUCATION NEEDS:   Not appropriate for education at this time  Skin:  Skin Assessment: Reviewed RN Assessment  Last BM:  6/13  Height:   Ht Readings from  Last 1 Encounters:  05/25/2021 5\' 3"  (1.6 m)    Weight:   Wt Readings from Last 1 Encounters:  05/23/2021 93.1 kg    BMI:  Body mass index is 36.36 kg/m.  Estimated Nutritional Needs:   Kcal:  1900-2200 kcals  Protein:  105-130 g  Fluid:  >/= 1.9 L   Lucas Mallow, RD, LDN, CNSC Please refer to Amion for contact information.

## 2021-06-07 NOTE — Progress Notes (Addendum)
Advanced Heart Failure Rounding Note     Subjective:    Remains on VA ECMO with Imella vent (see ECMO note)   On epi 6, NE 9, milrinone 0.25 MAPS 70    Remains intubated/sedated has moved some. EEG with encephalopathy.   Diuresed yesterday with chatter in lines. Had to give albumin. Concern for R hand hematoma/ischemia. Improved this am. VVS has seen      Lactate 7.5 -> 5.1 -> 1.2 Creatinine 1.8 -> 1.9 -> 1.9   CTs with mild drainage and some clots   Heparin level  > 0.12  PLTs down to 50K. Hgb down to 7.5 no obvious bleeeding   Making urine CVP 4         Objective:  Weight Range:   Vital Signs:                 Temp:  [97.5 F (36.4 C)-97.7 F (36.5 C)] 97.7 F (36.5 C) (06/17 0800) Pulse Rate:  [77-102] 90 (06/17 0800) Resp:  [15-16] 15 (06/17 0723) BP: (74-87)/(69-73) 87/73 (06/17 0723) SpO2:  [99 %-100 %] 100 % (06/17 0800) Arterial Line BP: (67-101)/(61-79) 91/78 (06/17 0800) FiO2 (%):  [30 %] 30 % (06/17 0723) Weight:  [93.1 kg] 93.1 kg (06/17 0500) Last BM Date: 06/03/21   Weight change:      Filed Weights    06/09/2021 1256 06/06/21 0500 05/31/2021 0500  Weight: 89.5 kg 94.7 kg 93.1 kg      Intake/Output:              Intake/Output Summary (Last 24 hours) at 06/03/2021 1001 Last data filed at 06/14/2021 0900    Gross per 24 hour  Intake 5036.29 ml  Output 6520 ml  Net -1483.71 ml                   Physical Exam: General:  Critically ill appearing. On vent  HEENT: normal + ETT Neck: supple. RIJ swan Carotids 2+ bilat; no bruits. No lymphadenopathy or thryomegaly appreciated. Cor: chest open. Mild amount of fluid/blood under dressing  + CTs and central ecmo cannulas Lungs: coarse Abdomen: soft, nontender, nondistended. No hepatosplenomegaly. No bruits or masses. Hypoactive bowel sounds. Extremities: no cyanosis, clubbing, rash, 1+ edema  RFA Impella  Neuro: intubated sedated     Telemetry: NSR 80-90s Personally reviewed      Labs: Basic Metabolic Panel: Last Labs                  Recent Labs  Lab 06/11/2021 0450 05/26/2021 1506 05/23/2021 1845 05/30/2021 1907 05/27/2021 2055 06/06/21 0012 06/06/21 0402 06/06/21 9977 06/06/21 1546 06/06/21 1557 06/06/21 2015 06/18/2021 0232 05/22/2021 0237  NA 139   < > 141   < > 144  142   < > 139   < > 141 144 146* 142 145  K 3.8   < > 3.8   < > 3.1*  2.9*   < > 3.4*   < > 3.7 3.8 3.7 4.3 4.3  CL 110   < > 116*  -- 112*  -- 112*  -- 113*  --  -- 114*  --  CO2 23  --  --  -- 17*  -- 17*  -- 20*  --  -- 22  --  GLUCOSE 81   < > 208*  -- 207*  -- 251*  -- 178*  --  -- 177*  --  BUN 16   < > 16  --  13  -- 14  -- 14  --  -- 14  --  CREATININE 1.50*   < > 1.40*  -- 1.76*  -- 1.91*  -- 1.90*  --  -- 1.93*  --  CALCIUM 8.9  --  --  -- 7.4*  -- 7.3*  -- 7.4*  --  -- 7.6*  --  MG 1.9  --  --  -- 1.8  -- 2.6*  -- 2.0  --  --  --  --   < > = values in this interval not displayed.         Liver Function Tests: Last Labs        Recent Labs  Lab 05/27/2021 2055 06/06/21 0402 06/08/2021 0232  AST 889* 771* 212*  ALT 290* 221* 53*  ALKPHOS 25* 26* 22*  BILITOT 0.7 0.7 0.7  PROT 3.6* 3.8* 4.2*  ALBUMIN 2.2* 2.6* 3.2*       Last Labs   No results for input(s): LIPASE, AMYLASE in the last 168 hours.   Last Labs   No results for input(s): AMMONIA in the last 168 hours.     CBC: Last Labs                  Recent Labs  Lab 06/02/2021 0450 06/19/2021 1506 06/03/2021 1630 05/23/2021 1644 06/20/2021 2055 06/06/21 0012 06/06/21 0402 06/06/21 0605 06/06/21 1557 06/06/21 1810 06/06/21 2015 06/09/2021 0232 05/23/2021 0237  WBC 8.2  --  --  -- 11.3*  -- 9.9  --  -- 8.2  -- 7.8  --  HGB 12.2   < > 8.2*   < > 12.2  12.3   < > 10.9*   < > 8.8* 7.8* 6.8* 7.5* 7.5*  HCT 38.8   < > 25.5*   < > 36.0  37.2   < > 33.7*   < > 26.0* 23.9* 20.0* 21.9* 22.0*  MCV 90.2  --  --  -- 88.2  -- 88.0  --  -- 86.6  -- 84.9  --  PLT 232  -- 120*  -- 104*  -- 99*  --  -- 67*  -- 50*  --   < > = values in  this interval not displayed.         Cardiac Enzymes: Last Labs   No results for input(s): CKTOTAL, CKMB, CKMBINDEX, TROPONINI in the last 168 hours.     BNP: BNP (last 3 results) Recent Labs (within last 365 days)  No results for input(s): BNP in the last 8760 hours.     ProBNP (last 3 results) Recent Labs (within last 365 days)  No results for input(s): PROBNP in the last 8760 hours.         Other results:   Imaging: CARDIAC CATHETERIZATION   Addendum Date: 05/27/2021    Mid LM to Dist LM lesion is 80% stenosed. Has the appearance of spontaneous coronary artery dissection  Ost Cx to Prox Cx lesion is 80% stenosed. Has the appearance of extension from the left main dissection into the proximal circumflex.  Prox LAD lesion is 100% stenosed. Occlusion resulting from extension of the left main dissection into the LAD.  LV end diastolic pressure is normal.  There is no aortic valve stenosis.  Successful placement of Impella CP device in the right groin.  CT surgery consult obtained.  She will go to the operating room for emergent bypass surgery.  Will convey message to anesthesia to look for larger aortic dissection  with their TEE probe.   Result Date: 06/01/2021  Mid LM to Dist LM lesion is 80% stenosed. Has the appearance of spontaneous coronary artery dissection  Ost Cx to Prox Cx lesion is 80% stenosed. Has the appearance of extension from the left main dissection into the proximal circumflex.  Prox LAD lesion is 100% stenosed. Occlusion resulting from extension of the left main dissection into the LAD.  LV end diastolic pressure is normal.  There is no aortic valve stenosis.  Successful placement of Impella CP device in the right groin.  CT surgery consult obtained.  She will go to the operating room for emergent bypass surgery.  Will convey message to anesthesia to look for larger aortic dissection with their TEE probe.   DG CHEST PORT 1 VIEW   Result Date:  06/08/2021 CLINICAL DATA:  Hypoxia EXAM: PORTABLE CHEST 1 VIEW COMPARISON:  June 06, 2021 FINDINGS: Endotracheal tube tip is 5.9 cm above the carina. Nasogastric tube tip and side port below the diaphragm. Impella device unchanged in position. ECMO catheters unchanged in position. There is a left chest tube and a mediastinal drain. Swan-Ganz catheter tip in right main pulmonary artery. No pneumothorax. There is a left pleural effusion. There is less interstitial edema compared to 1 day prior. No consolidation. There is stable cardiomegaly. Pulmonary vascularity is stable with a degree of pulmonary venous hypertension. Status post coronary artery bypass grafting. No evident adenopathy. No bone lesions appreciable. IMPRESSION: Tube and catheter positions as described. No pneumothorax evident. Slightly less interstitial edema compared to 1 day prior. Persistent left pleural effusion. Stable cardiomegaly. Electronically Signed   By: Lowella Grip III M.D.   On: 06/11/2021 08:11   DG CHEST PORT 1 VIEW   Result Date: 06/06/2021 CLINICAL DATA:  Hypoxia EXAM: PORTABLE CHEST 1 VIEW COMPARISON:  June 05, 2021 FINDINGS: Endotracheal tube tip is 4.3 cm above the carina. Nasogastric tube tip and side port are below the diaphragm. Impella device again noted, unchanged in position. Swan-Ganz catheter tip is in the right pulmonary artery. ECMO catheter tip near inferior vena cava-right atrium junction. Ventriculoperitoneal shunt catheter noted on the right. Left chest tube and mediastinal drain unchanged in position. No pneumothorax. Small left pleural effusion. Underlying interstitial edema. No consolidation. There is stable cardiomegaly with pulmonary venous hypertension. No adenopathy. No bone lesions. IMPRESSION: Tube and catheter positions as described without evident pneumothorax. Cardiomegaly with pulmonary vascular congestion. Small left pleural effusion with interstitial edema. Appearance consistent with a degree  of underlying congestive heart failure. Electronically Signed   By: Lowella Grip III M.D.   On: 06/06/2021 08:02   DG Chest Port 1 View   Result Date: 06/11/2021 CLINICAL DATA:  60 year old female status post CABG EXAM: PORTABLE CHEST 1 VIEW COMPARISON:  Chest radiograph dated 06/14/2021. FINDINGS: Endotracheal tube with tip across in the ED 4 cm above the carina. Right IJ Swan-Ganz catheter with tip over the right hilum. Partially visualized right-sided VP shunt. Enteric tube extends below the diaphragm with side-port in the left upper abdomen and tip beyond the inferior margin of the image. Inferiorly accessed mediastinal drains. Left subclavian vascular stent and cardiac assist pump cannula. Impella device with tip over the left ventricle. Bilateral patchy opacities involving the left upper lobe and right infrahilar region may represent atelectasis, mild edema, or infiltrate. No pleural effusion or pneumothorax. Pneumomediastinum, likely postsurgical. Mild cardiomegaly. CABG vascular clips. No acute osseous pathology. IMPRESSION: 1. Postsurgical changes of CABG. 2. Bilateral patchy opacities involving the  left upper lobe and right infrahilar region may represent atelectasis, mild edema, or infiltrate. 3. Support devices as described. Electronically Signed   By: Anner Crete M.D.   On: 06/19/2021 21:13   EEG adult   Result Date: 06/06/2021 Lora Havens, MD     06/06/2021 12:16 PM Patient Name: [name] [name] MRN: [id number] Epilepsy Attending: Lora Havens Referring Physician/Provider: Dr Kipp Brood Date: 06/06/2021 Duration: 25.04 mins Patient history: 60 year old female status post cardiac arrest.  EEG done for seizures. Level of alertness: comatose AEDs during EEG study: None Technical aspects: This EEG study was done with scalp electrodes positioned according to the 10-20 International system of electrode placement. Electrical activity was acquired at a sampling rate of _0  and  reviewed with a high frequency filter of _1  and a low frequency filter of _2 . EEG data were recorded continuously and digitally stored. Description: EEG showed continuous generalized 3 to 6 Hz theta-delta slowing.  EEG was reactive to noxious stimulation. Hyperventilation and photic stimulation were not performed.   ABNORMALITY - Continuous slow, generalized IMPRESSION: This study is suggestive of severe diffuse encephalopathy, nonspecific etiology. No seizures or epileptiform discharges were seen throughout the recording. Priyanka Barbra Sarks   Imaging Results (Last 48 hours)        Medications:      Scheduled Medications:  sodium chloride   Intravenous Once   acetaminophen  1,000 mg Oral Q6H    Or   acetaminophen (TYLENOL) oral liquid 160 mg/5 mL  1,000 mg Per Tube Q6H   aspirin EC  325 mg Oral Daily    Or   aspirin  324 mg Per Tube Daily   bisacodyl  10 mg Oral Daily    Or   bisacodyl  10 mg Rectal Daily   chlorhexidine gluconate (MEDLINE KIT)  15 mL Mouth Rinse BID   Chlorhexidine Gluconate Cloth  6 each Topical Daily   docusate  200 mg Per Tube Daily   mouth rinse  15 mL Mouth Rinse 10 times per day   pantoprazole sodium  40 mg Per Tube Daily   rosuvastatin  40 mg Per Tube q1800   sodium chloride flush  10-40 mL Intracatheter Q12H   sodium chloride flush  3 mL Intravenous Q12H      Infusions:  sodium chloride 10 mL/hr at 06/01/2021 0900   sodium chloride     sodium chloride     sodium chloride     sodium chloride     sodium chloride 10 mL/hr at 05/26/2021 0946   sodium chloride     albumin human 12.5 g (06/06/21 2018)   amiodarone 30 mg/hr (06/03/2021 0900)    ceFAZolin (ANCEF) IV     dexmedetomidine (PRECEDEX) IV infusion 0.7 mcg/kg/hr (06/01/2021 0900)   epinephrine 6 mcg/min (06/09/2021 0900)   feeding supplement (PIVOT 1.5 CAL) Stopped (06/06/2021 0900)   heparin 25 units/mL (Impella PURGE) in dextrose 5 % 1000 mL bag     heparin 250 Units/hr (06/16/2021 0900)   HYDROmorphone 8  mg/hr (06/11/2021 0900)   insulin 1.9 Units/hr (05/30/2021 0900)   lactated ringers     lactated ringers 10 mL/hr at 06/11/2021 0900   lactated ringers     milrinone 0.25 mcg/kg/min (05/27/2021 0900)   norepinephrine (LEVOPHED) Adult infusion 9 mcg/min (06/15/2021 0900)      PRN Medications: sodium chloride, sodium chloride, sodium chloride, albumin human, dextrose, HYDROmorphone, lactated ringers, metoprolol tartrate, midazolam, ondansetron (ZOFRAN) IV, sodium chloride flush, sodium chloride flush  Assessment/Plan    Cardiogenic shock in setting of left main dissection and emergent CABG - chest open on V-A ECMO with Impella vent - continue epi, NE and milrinone to maintain MAP 70s - PA pressures low - still volume overloaded but improved. Did not tolerate diuresis well yesterday. Will hold off on lasix today - Continue VA ECMO support. See ECMO note for parameters - Impella at P-1 for vent. Good waveforms. Groin site ok.  - EEG with diffuse encephalopathy - D/w Dr. Roxan Hockey. Plan chest washout to day at bedside   2. CAD with acute due to left main dissection - plan as above - continue ASA/statin   3. Acute respiratory failure due to above - ABG ok. Oxygenation is good - Vent per CCM   4. ECMO circuit - back on heparin. Level is low - Discussed dosing with PharmD personally. - goal HL 0.3-0.5   5. AKI - due to shock/ATN - SCr leveling off at 1.9  - continue support   6. Hypomag/hypokalemia - supp    7. Anemia due to expected post-operative blood loss and ECMO circuit - transfuse to keep hgb >= 8.0   8. Thrombocytopenia - due to critical illness. Follow.      CRITICAL CARE Performed by: Glori Bickers   Total critical care time: 45 minutes   Critical care time was exclusive of separately billable procedures and treating other patients.   Critical care was necessary to treat or prevent imminent or life-threatening deterioration.   Critical care was time  spent personally by me (independent of midlevel providers or residents) on the following activities: development of treatment plan with patient and/or surrogate as well as nursing, discussions with consultants, evaluation of patient's response to treatment, examination of patient, obtaining history from patient or surrogate, ordering and performing treatments and interventions, ordering and review of laboratory studies, ordering and review of radiographic studies, pulse oximetry and re-evaluation of patient's condition.       Length of Stay: 2     Glori Bickers MD 05/23/2021, 10:01 AM   Advanced Heart Failure Team Pager [phone] (M-F; [place])  Please contact [facility] for night-coverage after hours (4p -7a ) and weekends on [email or web address]

## 2021-06-07 NOTE — Progress Notes (Addendum)
[place] for IV Heparin Indication:  ECMO and Impella  No Known Allergies  Patient Measurements: Height: 5\' 3"  (160 cm) Weight: 93.1 kg (205 lb 4 oz) IBW/kg (Calculated) : 52.4 Heparin Dosing Weight: 72 kg  Vital Signs: Temp: 97.52 F (36.4 C) (06/17 2045) Temp Source: Core (06/17 2000) BP: 84/77 (06/17 1549) Pulse Rate: 82 (06/17 2045)  Labs: Recent Labs    05/30/2021 2234 06/09/2021 0006 06/06/2021 0450 05/29/2021 2230 06/06/21 0012 06/06/21 0402 06/06/21 0510 06/06/21 1131 06/06/21 1132 06/06/21 1546 06/06/21 1557 06/06/2021 0230 06/02/2021 0232 05/23/2021 0237 06/20/2021 0919 06/11/2021 1613 06/06/2021 1618 06/06/2021 2018  HGB  --   --    < >  --    < > 10.9*   < >  --    < >  --    < >  --  7.5*   < > 8.3* 7.1* 6.5* 8.5*  HCT  --   --    < >  --    < > 33.7*   < >  --    < >  --    < >  --  21.9*   < > 24.2* 22.0* 19.0* 25.0*  PLT  --   --    < >  --   --  99*  --   --   --   --    < >  --  50*  --  44* 88*  --   --   APTT  --   --    < > >200*  --  >200*  --  >200*  --  >200*  --   --   --   --  189*  --   --   --   LABPROT  --   --   --  19.2*  --  19.6*  --   --   --   --   --   --  18.5*  --   --   --   --   --   INR  --   --   --  1.6*  --  1.7*  --   --   --   --   --   --  1.5*  --   --   --   --   --   HEPARINUNFRC  --  0.97*   < >  --    < >  --    < > 0.77*  --  0.32   < > 0.11*  --   --  0.16* 0.27*  --   --   CREATININE  --   --    < >  --   --  1.91*  --   --   --  1.90*  --   --  1.93*  --   --  1.66*  --   --   TROPONINIHS 1,455* 1,903*  --   --   --   --   --   --   --   --   --   --   --   --   --   --   --   --    < > = values in this interval not displayed.     Estimated Creatinine Clearance: 39.1 mL/min (A) (by C-G formula based on SCr of 1.66 mg/dL (H)).   Medical History: Past Medical History:  Diagnosis Date   Arthritis  Brain aneurysm    Diabetes mellitus without complication ([place])    Headache     Hypertension        SVT (supraventricular tachycardia) (HCC)    s/p radiofrequency catheter ablation for AVNRT 02/16/09 (Dr. Cristopher Peru)   Thyroid disease    PROCEDURE FOR THYROID 15 YRS AGO AT [facility]   WPW (Wolff-Parkinson-White syndrome)     Medications:  Infusions:   sodium chloride 10 mL/hr at 05/23/2021 0900   sodium chloride     sodium chloride     sodium chloride     sodium chloride     sodium chloride 10 mL/hr at 06/15/2021 2000   sodium chloride     albumin human 12.5 g (06/06/21 2018)   amiodarone 30 mg/hr (06/15/2021 2041)    ceFAZolin (ANCEF) IV     dexmedetomidine (PRECEDEX) IV infusion 0.7 mcg/kg/hr (05/25/2021 2039)   epinephrine 6 mcg/min (06/11/2021 2000)   feeding supplement (PIVOT 1.5 CAL) 20 mL/hr at 06/11/2021 1325   heparin 25 units/mL (Impella PURGE) in dextrose 5 % 1000 mL bag     heparin 300 Units/hr (06/14/2021 2045)   HYDROmorphone 8 mg/hr (06/09/2021 2000)   insulin 1.6 mL/hr at 06/02/2021 2000   lactated ringers     lactated ringers 10 mL/hr at 05/22/2021 2000   lactated ringers     milrinone 0.25 mcg/kg/min (06/03/2021 2000)   norepinephrine (LEVOPHED) Adult infusion 8 mcg/min (05/23/2021 2000)    Assessment:  8 yoF admitted with cardiogenic shock 2/2 LM SCAD. Pt s/p CABG with VA-ECMO and Impella placement. Heparin started systemically and via purge (50 units/ml). Heparin levels elevated on purge alone so concentration reduced to half-strength (25 units/ml).  Heparin level 0.21, H/H improved from yesterday but remains low. No notable S/Sx bleeding and R hand hematoma improved. Pltc up slightly to 57k. TEG 6/17 demonstrated prolonged R-time with heparin level ~0.16 even with heparinase correlating with underlying hypocoagulable state - thus will target heparin level closer to 0.2.   Goal of Therapy:  Heparin level 0.2-0.5 - keep on low end for now  Monitor platelets by anticoagulation protocol: Yes   Plan:  -Continue half-strength heparinized purge (25  units/ml) -Continue systemic heparin 300 units/h -Check heparin level and CBC q12h   [name], PharmD, BCPS, [facility] Clinical Pharmacist [phone] Please check AMION for all [facility] Pharm[facility] numbers 06/08/2021

## 2021-06-07 NOTE — Progress Notes (Signed)
[facility] BRIEF SICU PROGRESS NOTE  Day of Surgery  S/P Procedure(s) (LRB): Reexploration and evacuation of hematoma (N/A)   Reportedly starting to follow simple commands w/ both hands NSR w/ stable hemodynamics although Impella and ECMO flows down some Chest tube output low since return from OR Hgb 7.1 and repeat 6.5  Plan: Transfuse 2 units PRBCs  Rexene Alberts, MD 06/09/2021 6:16 PM

## 2021-06-07 NOTE — Consult Note (Signed)
Consultation Note Date: 06/06/2021   Patient Name: [name] [name]  DOB: 03-10-1961  MRN: [id number]  Age / Sex: 60 y.o., female  PCP: Lucianne Lei, MD Referring Physician: Melrose Nakayama, MD  Reason for Consultation: Establishing goals of care  HPI/Patient Profile: 60 y.o. female  with past medical history of brain aneurysm, HTN, SVT s/p ablation, HTN, hypothyroidism admitted on 06/03/2021 with chest pain. Cardiac cath revealed SCAD with no distal flow on second injection, during procedure she complained of crushing chest pain, hypotension, suffered cardiogenic shock was found with STEMI- had Impella placed, and taken for emergent CABG. After CABG was not able to wean from the Impella and therefore was cannulated and VA ECMO started. She remains intubated, moving extremities at times, EEG shows significant encephalopathy. Palliative medicine consulted for "ECMO".    Clinical Assessment and Goals of Care: I have reviewed medical records including EPIC notes, labs and imaging, received report from RN and assessed the patient and then spoke with family via phone to discuss diagnosis prognosis, Anta[place], EOL wishes, disposition and options.  I introduced Palliative Medicine as specialized medical care for people living with serious illness. It focuses on providing relief from the symptoms and stress of a serious illness. The goal is to improve quality of life for both the patient and the family.  I established that decision maker, next of kin for patient is her mother [name]. I called Tonia Ghent- however- she requested that all communication go through her niece- Orvilla Cornwall. Orvilla Cornwall confirmed that Tonia Ghent is the Media planner- but Tonia Ghent prefers that [place] serve as Dentist between Scientist, water quality and Tonia Ghent. Inez Catalina has a medical background and is able to help Tonia Ghent understand.   We discussed a brief life review of the patient and  then focused on their current illness.   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 She greatly enjoyed her job. She was caring for her 30yr old grandson (both of Ida's children are disabled and institutionalized due to mental illness).  Chanise values spending time with her family, eating good food, laughing. She is known for her outgoing personality.   I discussed Romesha's trajectory thus far and what the future may hold for [place]. Inez Catalina is aware that if Dulce is able to recover and liberate from ECMO she will be facing prolonged rehabilitation. We discussed the ongoing risks that are inherit with ECMO. We discussed that typically patients are kept on ECMO until they recover, or some event occurs that makes ECMO no longer a viable option for a patient.   Inez Catalina shares that she is preparing the family- encouraging them to hope for the best- but prepare for the worst. She is hopeful that Tenessa will eventually recover and be able to come home- but she does understand that it is going to be a long process and that other events that affect Tonia's ability to recover may occur.   Discussed the importance of continued conversation with family and the medical providers regarding  overall plan of care and treatment options, ensuring decisions are within the context of the patient's values and GOCs.    Primary Decision Maker NEXT OF KIN - [name]    SUMMARY OF RECOMMENDATIONS -Please communicate primarily with Orvilla Cornwall who will communicate with [name]- final decisions should be made by Tonia Ghent as she is legal next of kin -PMT will continue to follow intermittently for family support    Code Status/Advance Care Planning: Limited code   No Known Allergies Review of Systems  Unable to perform ROS: Acuity of condition   Physical Exam Vitals and nursing note reviewed.  Constitutional:      Appearance: She is obese.  Cardiovascular:     Comments: Open  chest Neurological:     Comments: sedated    Vital Signs: BP (!) 82/73   Pulse 92   Temp (!) 97.2 F (36.2 C) (Core)   Resp 15   Ht 5\' 3"  (1.6 m)   Wt 93.1 kg   SpO2 100%   BMI 36.36 kg/m  Pain Scale: CPOT POSS *See Group Information*: 4-INTERVENTION REQUIRED,Unacceptable,Somnolent, mininal or no response to verbal and physical stimulation Pain Score: 0-No pain   SpO2: SpO2: 100 % O2 Device:SpO2: 100 % O2 Flow Rate: .O2 Flow Rate (L/min): 2 L/min  IO: Intake/output summary:  Intake/Output Summary (Last 24 hours) at 06/06/2021 1443 Last data filed at 05/27/2021 1400 Gross per 24 hour  Intake 4896.56 ml  Output 4975 ml  Net -78.44 ml    LBM: Last BM Date: 06/03/21 Baseline Weight: Weight: 87.1 kg Most recent weight: Weight: 93.1 kg     Palliative Assessment/Data: PPS: 10%     Thank you for this consult. Palliative medicine will continue to follow and assist as needed.   Time In: 1403 Time Out: 1541 Time Total: 98 minutes Prolonged services billed Greater than 50%  of this time was spent counseling and coordinating care related to the above assessment and plan.  Signed by: Mariana Kaufman, AGNP-C Palliative Medicine    Please contact Palliative Medicine Team phone at [phone] for questions and concerns.  For individual provider: See Shea Evans

## 2021-06-08 ENCOUNTER — Inpatient Hospital Stay (HOSPITAL_COMMUNITY): Payer: 59

## 2021-06-08 DIAGNOSIS — R570 Cardiogenic shock: Secondary | ICD-10-CM | POA: Diagnosis not present

## 2021-06-08 DIAGNOSIS — R079 Chest pain, unspecified: Secondary | ICD-10-CM | POA: Diagnosis not present

## 2021-06-08 LAB — GLUCOSE, CAPILLARY
Glucose-Capillary: 188 mg/dL — ABNORMAL HIGH (ref 70–99)
Glucose-Capillary: 153 mg/dL — ABNORMAL HIGH (ref 70–99)
Glucose-Capillary: 141 mg/dL — ABNORMAL HIGH (ref 70–99)
Glucose-Capillary: 171 mg/dL — ABNORMAL HIGH (ref 70–99)
Glucose-Capillary: 127 mg/dL — ABNORMAL HIGH (ref 70–99)
Glucose-Capillary: 146 mg/dL — ABNORMAL HIGH (ref 70–99)
Glucose-Capillary: 139 mg/dL — ABNORMAL HIGH (ref 70–99)
Glucose-Capillary: 159 mg/dL — ABNORMAL HIGH (ref 70–99)
Glucose-Capillary: 136 mg/dL — ABNORMAL HIGH (ref 70–99)
Glucose-Capillary: 134 mg/dL — ABNORMAL HIGH (ref 70–99)
Glucose-Capillary: 137 mg/dL — ABNORMAL HIGH (ref 70–99)
Glucose-Capillary: 138 mg/dL — ABNORMAL HIGH (ref 70–99)
Glucose-Capillary: 140 mg/dL — ABNORMAL HIGH (ref 70–99)
Glucose-Capillary: 144 mg/dL — ABNORMAL HIGH (ref 70–99)
Glucose-Capillary: 148 mg/dL — ABNORMAL HIGH (ref 70–99)
Glucose-Capillary: 149 mg/dL — ABNORMAL HIGH (ref 70–99)
Glucose-Capillary: 152 mg/dL — ABNORMAL HIGH (ref 70–99)
Glucose-Capillary: 153 mg/dL — ABNORMAL HIGH (ref 70–99)
Glucose-Capillary: 135 mg/dL — ABNORMAL HIGH (ref 70–99)
Glucose-Capillary: 150 mg/dL — ABNORMAL HIGH (ref 70–99)
Glucose-Capillary: 109 mg/dL — ABNORMAL HIGH (ref 70–99)
Glucose-Capillary: 126 mg/dL — ABNORMAL HIGH (ref 70–99)
Glucose-Capillary: 113 mg/dL — ABNORMAL HIGH (ref 70–99)
Glucose-Capillary: 110 mg/dL — ABNORMAL HIGH (ref 70–99)

## 2021-06-08 LAB — POCT I-STAT 7, (LYTES, BLD GAS, ICA,H+H)
Acid-base deficit: 2 mmol/L (ref 0.0–2.0)
Acid-base deficit: 2 mmol/L (ref 0.0–2.0)
Acid-base deficit: 1 mmol/L (ref 0.0–2.0)
Bicarbonate: 23.1 mmol/L (ref 20.0–28.0)
Bicarbonate: 23 mmol/L (ref 20.0–28.0)
Bicarbonate: 23.7 mmol/L (ref 20.0–28.0)
Calcium, Ion: 1.16 mmol/L (ref 1.15–1.40)
Calcium, Ion: 1.18 mmol/L (ref 1.15–1.40)
Calcium, Ion: 1.13 mmol/L — ABNORMAL LOW (ref 1.15–1.40)
HCT: 22 % — ABNORMAL LOW (ref 36.0–46.0)
HCT: 27 % — ABNORMAL LOW (ref 36.0–46.0)
HCT: 25 % — ABNORMAL LOW (ref 36.0–46.0)
Hemoglobin: 7.5 g/dL — ABNORMAL LOW (ref 12.0–15.0)
Hemoglobin: 9.2 g/dL — ABNORMAL LOW (ref 12.0–15.0)
Hemoglobin: 8.5 g/dL — ABNORMAL LOW (ref 12.0–15.0)
O2 Saturation: 100 %
O2 Saturation: 100 %
O2 Saturation: 100 %
Patient temperature: 36.4
Patient temperature: 36.4
Patient temperature: 36.4
Potassium: 5.1 mmol/L (ref 3.5–5.1)
Potassium: 5 mmol/L (ref 3.5–5.1)
Potassium: 4.8 mmol/L (ref 3.5–5.1)
Sodium: 143 mmol/L (ref 135–145)
Sodium: 141 mmol/L (ref 135–145)
Sodium: 143 mmol/L (ref 135–145)
TCO2: 24 mmol/L (ref 22–32)
TCO2: 24 mmol/L (ref 22–32)
TCO2: 25 mmol/L (ref 22–32)
pCO2 arterial: 40.1 mmHg (ref 32.0–48.0)
pCO2 arterial: 39.1 mmHg (ref 32.0–48.0)
pCO2 arterial: 39.1 mmHg (ref 32.0–48.0)
pH, Arterial: 7.365 (ref 7.350–7.450)
pH, Arterial: 7.374 (ref 7.350–7.450)
pH, Arterial: 7.389 (ref 7.350–7.450)
pO2, Arterial: 427 mmHg — ABNORMAL HIGH (ref 83.0–108.0)
pO2, Arterial: 384 mmHg — ABNORMAL HIGH (ref 83.0–108.0)
pO2, Arterial: 362 mmHg — ABNORMAL HIGH (ref 83.0–108.0)

## 2021-06-08 LAB — BPAM PLATELET PHERESIS
Blood Product Expiration Date: 202206182359
ISSUE DATE / TIME: 202206171148
Unit Type and Rh: 5100

## 2021-06-08 LAB — BASIC METABOLIC PANEL
Anion gap: 3 — ABNORMAL LOW (ref 5–15)
Anion gap: <3 — ABNORMAL LOW (ref 5–15)
BUN: 14 mg/dL (ref 6–20)
BUN: 13 mg/dL (ref 6–20)
CO2: 21 mmol/L — ABNORMAL LOW (ref 22–32)
CO2: 22 mmol/L (ref 22–32)
Calcium: 7.2 mg/dL — ABNORMAL LOW (ref 8.9–10.3)
Calcium: 7.4 mg/dL — ABNORMAL LOW (ref 8.9–10.3)
Chloride: 115 mmol/L — ABNORMAL HIGH (ref 98–111)
Chloride: 114 mmol/L — ABNORMAL HIGH (ref 98–111)
Creatinine, Ser: 1.59 mg/dL — ABNORMAL HIGH (ref 0.44–1.00)
Creatinine, Ser: 1.51 mg/dL — ABNORMAL HIGH (ref 0.44–1.00)
GFR, Estimated: 37 mL/min — ABNORMAL LOW (ref >60)
GFR, Estimated: 39 mL/min — ABNORMAL LOW (ref >60)
Glucose, Bld: 150 mg/dL — ABNORMAL HIGH (ref 70–99)
Glucose, Bld: 152 mg/dL — ABNORMAL HIGH (ref 70–99)
Potassium: 5 mmol/L (ref 3.5–5.1)
Potassium: 5 mmol/L (ref 3.5–5.1)
Sodium: 139 mmol/L (ref 135–145)
Sodium: 138 mmol/L (ref 135–145)

## 2021-06-08 LAB — GLOBAL TEG PANEL
CFF Max Amplitude: 22.1 mm (ref 15–32)
CK with Heparinase (R): 9.3 min — ABNORMAL HIGH (ref 4.3–8.3)
Citrated Functional Fibrinogen: 403.3 mg/dL (ref 278–581)
Citrated Kaolin (K): >5 min — ABNORMAL HIGH (ref 0.8–2.1)
Citrated Kaolin (MA): 60.4 mm (ref 52–69)
Citrated Kaolin (R): >17 min — ABNORMAL HIGH (ref 4.6–9.1)
Citrated Kaolin Angle: 40 deg — ABNORMAL LOW (ref 63–78)
Citrated Rapid TEG (MA): 60.5 mm (ref 52–70)

## 2021-06-08 LAB — CBC
HCT: 24.7 % — ABNORMAL LOW (ref 36.0–46.0)
HCT: 29.8 % — ABNORMAL LOW (ref 36.0–46.0)
Hemoglobin: 8.2 g/dL — ABNORMAL LOW (ref 12.0–15.0)
Hemoglobin: 10.1 g/dL — ABNORMAL LOW (ref 12.0–15.0)
MCH: 29.5 pg (ref 26.0–34.0)
MCH: 29.7 pg (ref 26.0–34.0)
MCHC: 33.2 g/dL (ref 30.0–36.0)
MCHC: 33.9 g/dL (ref 30.0–36.0)
MCV: 88.8 fL (ref 80.0–100.0)
MCV: 87.6 fL (ref 80.0–100.0)
Platelets: 57 10*3/uL — ABNORMAL LOW (ref 150–400)
Platelets: 38 10*3/uL — ABNORMAL LOW (ref 150–400)
RBC: 2.78 MIL/uL — ABNORMAL LOW (ref 3.87–5.11)
RBC: 3.4 MIL/uL — ABNORMAL LOW (ref 3.87–5.11)
RDW: 17.3 % — ABNORMAL HIGH (ref 11.5–15.5)
RDW: 16.3 % — ABNORMAL HIGH (ref 11.5–15.5)
WBC: 8.2 10*3/uL (ref 4.0–10.5)
WBC: 7.9 10*3/uL (ref 4.0–10.5)
nRBC: 0.4 % — ABNORMAL HIGH (ref 0.0–0.2)
nRBC: 0.8 % — ABNORMAL HIGH (ref 0.0–0.2)

## 2021-06-08 LAB — HEPATIC FUNCTION PANEL
ALT: 16 U/L (ref 0–44)
AST: 87 U/L — ABNORMAL HIGH (ref 15–41)
Albumin: 2.4 g/dL — ABNORMAL LOW (ref 3.5–5.0)
Alkaline Phosphatase: 28 U/L — ABNORMAL LOW (ref 38–126)
Bilirubin, Direct: 0.2 mg/dL (ref 0.0–0.2)
Indirect Bilirubin: 0.9 mg/dL (ref 0.3–0.9)
Total Bilirubin: 1.1 mg/dL (ref 0.3–1.2)
Total Protein: 3.7 g/dL — ABNORMAL LOW (ref 6.5–8.1)

## 2021-06-08 LAB — LACTATE DEHYDROGENASE: LDH: 580 U/L — ABNORMAL HIGH (ref 98–192)

## 2021-06-08 LAB — PREPARE RBC (CROSSMATCH)

## 2021-06-08 LAB — PROTIME-INR
INR: 1.4 — ABNORMAL HIGH (ref 0.8–1.2)
Prothrombin Time: 16.7 seconds — ABNORMAL HIGH (ref 11.4–15.2)

## 2021-06-08 LAB — HEPARIN LEVEL (UNFRACTIONATED)
Heparin Unfractionated: 0.21 IU/mL — ABNORMAL LOW (ref 0.30–0.70)
Heparin Unfractionated: 0.12 IU/mL — ABNORMAL LOW (ref 0.30–0.70)

## 2021-06-08 LAB — APTT: aPTT: 173 seconds (ref 24–36)

## 2021-06-08 LAB — PREPARE PLATELET PHERESIS: Unit division: 0

## 2021-06-08 LAB — FIBRINOGEN: Fibrinogen: 398 mg/dL (ref 210–475)

## 2021-06-08 LAB — COOXEMETRY PANEL
Carboxyhemoglobin: 1.2 % (ref 0.5–1.5)
Methemoglobin: 0.9 % (ref 0.0–1.5)
O2 Saturation: 90.2 %
Total hemoglobin: 7.2 g/dL — ABNORMAL LOW (ref 12.0–16.0)

## 2021-06-08 MED ORDER — SODIUM CHLORIDE 0.9% IV SOLUTION: Freq: Once | INTRAVENOUS | Status: DC

## 2021-06-08 MED ORDER — SODIUM CHLORIDE 0.9% IV SOLUTION: Freq: Once | INTRAVENOUS | Status: AC

## 2021-06-08 MED ORDER — MIDAZOLAM 50MG/50ML (1MG/ML) PREMIX INFUSION
0.5000 mg/h | INTRAVENOUS | Status: DC
  Administered 2021-06-08: 4 mg/h via INTRAVENOUS
  Administered 2021-06-08: 2 mg/h via INTRAVENOUS
  Administered 2021-06-09: 7 mg/h via INTRAVENOUS
  Administered 2021-06-09: 6 mg/h via INTRAVENOUS
  Administered 2021-06-09 (×2): 4 mg/h via INTRAVENOUS
  Administered 2021-06-10 – 2021-06-11 (×3): 7 mg/h via INTRAVENOUS
  Administered 2021-06-11: 4 mg/h via INTRAVENOUS
  Administered 2021-06-12: 7 mg/h via INTRAVENOUS
  Administered 2021-06-12 – 2021-06-13 (×3): 5 mg/h via INTRAVENOUS
  Administered 2021-06-13: 6 mg/h via INTRAVENOUS
  Administered 2021-06-13: 5 mg/h via INTRAVENOUS
  Administered 2021-06-14 – 2021-06-15 (×3): 6 mg/h via INTRAVENOUS
  Filled 2021-06-08 (×20): qty 50

## 2021-06-08 MED ORDER — HEPARIN (PORCINE) 25000 UT/250ML-% IV SOLN
200.0000 [IU]/h | INTRAVENOUS | Status: DC
  Administered 2021-06-08 (×2): 200 [IU]/h via INTRAVENOUS
  Filled 2021-06-08: qty 250

## 2021-06-08 MED ORDER — SODIUM CHLORIDE 0.9% IV SOLUTION
Freq: Once | INTRAVENOUS | Status: AC
Start: 2021-06-08 — End: 2021-06-08

## 2021-06-08 NOTE — Progress Notes (Addendum)
Pt bleeding from cannulation site. Dr. Haroldine Laws at bedside. 2 units PRBCs ordered to be given emergently awaiting Dr. Roxy Manns at bedside.  2 units PRBCs checked off with Misha, RN at bedside. PRBC unit # [id number] started at 0956, ended at 1017 PRBC unit # [id number] started at 1017, ended at 1046

## 2021-06-08 NOTE — Progress Notes (Signed)
[address]       [place] [address]             [phone]        CARDIOTHORACIC SURGERY PROGRESS NOTE   R1 Day Post-Op Procedure(s) (LRB): Reexploration and evacuation of hematoma (N/A)  Subjective: Currently sedated on vent.  Earlier this morning had apparently woken up and moved suddenly, after which time some bleeding from around Esmarch dressing was noted  Objective: Vital signs: BP Readings from Last 1 Encounters:  06/08/21 (!) 76/64   Pulse Readings from Last 1 Encounters:  06/08/21 77   Resp Readings from Last 1 Encounters:  06/08/21 15   Temp Readings from Last 1 Encounters:  06/08/21 (!) 97.52 F (36.4 C)    Hemodynamics: PAP: (15-28)/(9-18) 25/15 CVP:  [4 mmHg-14 mmHg] 13 mmHg ECMO flow 3.4 L/min @ 2800 rpm Impella vent 1.2 L/min  Physical Exam:  Rhythm:   sinus  Breath sounds: distant  Heart sounds:  distant  Incisions:  Esmarch intact, no current bleeding  Abdomen:  Soft, non-distended, quiet  Extremities:  Warm, well-perfused  Chest tubes:  Approx 50 mL/hr overnight but mediastinal tubes look clotted    Intake/Output from previous day: 06/17 0701 - 06/18 0700 In: 4436.3 [I.V.:2347.2; Blood:913; NG/GT:391.7; IV Piggyback:403.3] Out: 2390 [Urine:1500; Chest Tube:890] Intake/Output this shift: Total I/O In: 575.6 [I.V.:193.8; Other:31.8; NG/GT:100; IV Piggyback:250] Out: 175 [Urine:115; Chest Tube:60]  Lab Results:  CBC: Recent Labs    06/02/2021 1613 06/19/2021 1618 06/08/21 0302 06/08/21 0304  WBC 7.6  --  8.2  --   HGB 7.1*   < > 8.2* 7.5*  HCT 22.0*   < > 24.7* 22.0*  PLT 88*  --  57*  --    < > = values in this interval not displayed.    BMET:  Recent Labs    06/08/2021 1613 05/29/2021 1618 06/08/21 0302 06/08/21 0304  NA 141   < > 139 143  K 4.6   < > 5.0 5.1  CL 116*  --  115*  --   CO2 22  --  21*  --   GLUCOSE 143*  --  150*  --   BUN 12  --  14  --   CREATININE 1.66*  --  1.59*  --   CALCIUM  7.5*  --  7.2*  --    < > = values in this interval not displayed.     PT/INR:   Recent Labs    06/08/21 0302  LABPROT 16.7*  INR 1.4*    CBG (last 3)  Recent Labs    06/08/21 0612 06/08/21 0657 06/08/21 0816  GLUCAP 188* 153* 141*    ABG    Component Value Date/Time   PHART 7.365 06/08/2021 0304   PCO2ART 40.1 06/08/2021 0304   PO2ART 427 (H) 06/08/2021 0304   HCO3 23.1 06/08/2021 0304   TCO2 24 06/08/2021 0304   ACIDBASEDEF 2.0 06/08/2021 0304   O2SAT 90.2 06/08/2021 0913    CXR: PORTABLE CHEST 1 VIEW   COMPARISON:  06/19/2021   FINDINGS: Endotracheal tube in good position. NG tube in the stomach. Impella device in the left ventricle unchanged. Large bore central venous catheters bilaterally for ECMO unchanged in position. Swan-Ganz catheter in the right pulmonary artery.   Improved aeration in the left lung. Left basilar chest tube in place. Minimal right lower lobe atelectasis. No edema or pneumothorax.   IMPRESSION: Support lines unchanged and  in good position.   Improved aeration in the left lung compared to the prior study.   Left chest tube unchanged in position.  No pneumothorax.     Electronically Signed   By: Franchot Gallo M.D.   On: 06/08/2021 09:49  Assessment/Plan: S/P Procedure(s) (LRB): Reexploration and evacuation of hematoma (N/A)  Clinically stable on ECMO support Maintaining NSR w/ stable ECMO and Impella flows, good pulsatility in [place] O2 sats 100% and CXR looks good Expected post op acute blood loss anemia, Hgb down 8.2 Post op thrombocytopenia, platelet count 57k Post op elevated serum creatinine, likely due to prerenal azotemia +/- acute kidney injury caused by ATN, UOP adequate and creatinine trending down Elevated liver enzymes likely due to shock, trending down Neuro status appears grossly intact  Remove clots from chest tubes and watch for signs of bleeding around cannulas Keep sedated to minimize spontaneous  movement Transfuse PRBC's and watch platelet count   Rexene Alberts, MD 06/08/2021 10:03 AM

## 2021-06-08 NOTE — Progress Notes (Signed)
[place] for IV Heparin Indication:  ECMO and Impella  No Known Allergies  Patient Measurements: Height: 5\' 3"  (160 cm) Weight: 94.9 kg (209 lb 3.5 oz) IBW/kg (Calculated) : 52.4 Heparin Dosing Weight: 72 kg  Vital Signs: Temp: 97.16 F (36.2 C) (06/18 1030) Temp Source: Core (06/18 0745) BP: 76/64 (06/18 0811) Pulse Rate: 81 (06/18 1030)  Labs: Recent Labs    06/06/21 0402 06/06/21 0510 06/06/21 1546 06/06/21 1557 06/18/2021 0232 06/15/2021 0237 05/28/2021 0919 06/02/2021 1613 05/24/2021 1618 06/02/2021 2018 06/08/21 0302 06/08/21 0304  HGB 10.9*   < >  --    < > 7.5*   < > 8.3* 7.1*   < > 8.5* 8.2* 7.5*  HCT 33.7*   < >  --    < > 21.9*   < > 24.2* 22.0*   < > 25.0* 24.7* 22.0*  PLT 99*  --   --    < > 50*  --  44* 88*  --   --  57*  --   APTT >200*   < > >200*  --   --   --  189*  --   --   --  173*  --   LABPROT 19.6*  --   --   --  18.5*  --   --   --   --   --  16.7*  --   INR 1.7*  --   --   --  1.5*  --   --   --   --   --  1.4*  --   HEPARINUNFRC  --    < > 0.32   < >  --   --  0.16* 0.27*  --   --  0.21*  --   CREATININE 1.91*  --  1.90*  --  1.93*  --   --  1.66*  --   --  1.59*  --    < > = values in this interval not displayed.     Estimated Creatinine Clearance: 41.2 mL/min (A) (by C-G formula based on SCr of 1.59 mg/dL (H)).   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 Cristopher Peru)   Thyroid disease    PROCEDURE FOR THYROID 15 YRS AGO AT [facility]   WPW (Wolff-Parkinson-White syndrome)     Medications:  Infusions:   sodium chloride 10 mL/hr at 05/26/2021 0900   sodium chloride     sodium chloride     sodium chloride     sodium chloride     sodium chloride 10 mL/hr at 06/08/21 1000    sodium chloride     albumin human 120 mL/hr at 06/08/21 0900   amiodarone 30 mg/hr (06/08/21 1000)    ceFAZolin (ANCEF) IV Stopped (06/08/21 7616)   dexmedetomidine (PRECEDEX) IV infusion 0.7 mcg/kg/hr (06/08/21 1000)   epinephrine 6 mcg/min (06/08/21 1000)   feeding supplement (PIVOT 1.5 CAL) 20 mL/hr at 06/16/2021 1325   heparin 25 units/mL (Impella PURGE) in dextrose 5 % 1000 mL bag     heparin Stopped (06/08/21 0934)   HYDROmorphone 8 mg/hr (06/08/21 1000)   insulin 4.4 Units/hr (06/08/21 1000)   lactated ringers  lactated ringers 10 mL/hr at 06/08/21 1000   lactated ringers     midazolam 4 mg/hr (06/08/21 1000)   milrinone 0.25 mcg/kg/min (06/08/21 1000)   norepinephrine (LEVOPHED) Adult infusion 8 mcg/min (06/08/21 1000)    Assessment:  40 yoF admitted with cardiogenic shock 2/2 LM SCAD. Pt s/p CABG with VA-ECMO and Impella placement. Heparin started systemically and via purge (50 units/ml). Heparin levels elevated on purge alone so concentration reduced to half-strength (25 units/ml).  Heparin level 0.21, H/H improved from yesterday but remains low. No notable S/Sx bleeding and R hand hematoma improved. Pltc up slightly to 57k. TEG 6/17 demonstrated prolonged R-time with heparin level ~0.16 even with heparinase correlating with underlying hypocoagulable state - thus will target heparin level closer to 0.2.  Heparin paused briefly with bleeding, will restart at 1200.   Goal of Therapy:  Heparin level 0.2-0.5 - keep on low end for now  Monitor platelets by anticoagulation protocol: Yes   Plan:  -Continue half-strength heparinized purge (25 units/ml) -Resume systemic heparin at 200 units/h at 1200 -Check heparin level and CBC q12h   [name], PharmD, BCPS, [facility] Clinical Pharmacist [phone] Please check AMION for all [facility] Pharmacy numbers 06/08/2021

## 2021-06-08 NOTE — Progress Notes (Addendum)
Advanced Heart Failure Rounding Note   Subjective:    6/15 - LM coronary scad with emergent CABG  -> ECMO 6/17 - chest washout at bedside  Remains on VA ECMO with Impella vent (see ECMO note)  On epi 6, NE 8, milrinone 0.25 and IV amio  MAPS 60-70s  Chest washed out yesterday. Got 2 units of blood yesterday. Hgb back down to 7.2   Remains intubated/sedated but will wake up and follow commands  MAPs drops and Impella flow goes down. Felt to be dry. Giving albumin.   Lactate 7.5 -> 5.1 -> 1.2  Creatinine 1.8 -> 1.9 -> 1.9 -> 1.6 LDH 580  CTs still oozy   Heparin level  > 0.21 PLTs up  50K. - >57k  Hgb down to 7.5 no obvious bleeeding   Making urine  Weight up 5 kg.    Objective:   Weight Range:  Vital Signs:   Temp:  [97.16 F (36.2 C)-97.7 F (36.5 C)] 97.52 F (36.4 C) (06/18 0815) Pulse Rate:  [73-92] 82 (06/18 0815) Resp:  [15] 15 (06/18 0811) BP: (76-84)/(64-77) 76/64 (06/18 0811) SpO2:  [100 %] 100 % (06/18 0815) Arterial Line BP: (70-99)/(13-79) 81/65 (06/18 0815) FiO2 (%):  [30 %] 30 % (06/18 0812) Weight:  [94.9 kg] 94.9 kg (06/18 0500) Last BM Date: 06/03/21  Weight change: Filed Weights   06/06/21 0500 06/03/2021 0500 06/08/21 0500  Weight: 94.7 kg 93.1 kg 94.9 kg    Intake/Output:   Intake/Output Summary (Last 24 hours) at 06/08/2021 6168 Last data filed at 06/08/2021 0800 Gross per 24 hour  Intake 4452.16 ml  Output 2505 ml  Net 1947.16 ml      Physical Exam: General:  Sedated Will awake up on vent HEENT: normal + ETT Neck: RIJ swan   Cor: Chest open  + CT tubes  + ECMO cannulas Regular rate & rhythm.  Lungs: clear Abdomen: soft, nontender, nondistended. No hepatosplenomegaly. No bruits or masses. Good bowel sounds. Extremities: no cyanosis, clubbing, rash, 1+ edema Neuro: Sedated Will awake up on vent   Telemetry: NSR 80-90s Personally reviewed   Labs: Basic Metabolic Panel: Recent Labs  Lab 06/11/2021 0450 06/01/2021 1506  06/14/2021 2055 06/06/21 0012 06/06/21 0402 06/06/21 0605 06/06/21 1546 06/06/21 1557 05/24/2021 0232 05/28/2021 0237 06/11/2021 1613 06/01/2021 1618 06/16/2021 2018 06/08/21 0302 06/08/21 0304  NA 139   < > 144  142   < > 139   < > 141   < > 142   < > 141 144 145 139 143  K 3.8   < > 3.1*  2.9*   < > 3.4*   < > 3.7   < > 4.3   < > 4.6 4.7 4.8 5.0 5.1  CL 110   < > 112*  --  112*  --  113*  --  114*  --  116*  --   --  115*  --   CO2 23  --  17*  --  17*  --  20*  --  22  --  22  --   --  21*  --   GLUCOSE 81   < > 207*  --  251*  --  178*  --  177*  --  143*  --   --  150*  --   BUN 16   < > 13  --  14  --  14  --  14  --  12  --   --  14  --   CREATININE 1.50*   < > 1.76*  --  1.91*  --  1.90*  --  1.93*  --  1.66*  --   --  1.59*  --   CALCIUM 8.9  --  7.4*  --  7.3*  --  7.4*  --  7.6*  --  7.5*  --   --  7.2*  --   MG 1.9  --  1.8  --  2.6*  --  2.0  --   --   --   --   --   --   --   --    < > = values in this interval not displayed.     Liver Function Tests: Recent Labs  Lab 05/26/2021 2055 06/06/21 0402 06/18/2021 0232 06/08/21 0302  AST 889* 771* 212* 87*  ALT 290* 221* 53* 16  ALKPHOS 25* 26* 22* 28*  BILITOT 0.7 0.7 0.7 1.1  PROT 3.6* 3.8* 4.2* 3.7*  ALBUMIN 2.2* 2.6* 3.2* 2.4*    No results for input(s): LIPASE, AMYLASE in the last 168 hours. No results for input(s): AMMONIA in the last 168 hours.  CBC: Recent Labs  Lab 06/06/21 1810 06/06/21 2015 05/30/2021 0232 06/20/2021 0237 05/27/2021 0919 06/01/2021 1613 05/31/2021 1618 06/08/2021 2018 06/08/21 0302 06/08/21 0304  WBC 8.2  --  7.8  --  7.7 7.6  --   --  8.2  --   HGB 7.8*   < > 7.5*   < > 8.3* 7.1* 6.5* 8.5* 8.2* 7.5*  HCT 23.9*   < > 21.9*   < > 24.2* 22.0* 19.0* 25.0* 24.7* 22.0*  MCV 86.6  --  84.9  --  84.3 85.6  --   --  88.8  --   PLT 67*  --  50*  --  44* 88*  --   --  57*  --    < > = values in this interval not displayed.     Cardiac Enzymes: No results for input(s): CKTOTAL, CKMB, CKMBINDEX,  TROPONINI in the last 168 hours.  BNP: BNP (last 3 results) No results for input(s): BNP in the last 8760 hours.  ProBNP (last 3 results) No results for input(s): PROBNP in the last 8760 hours.    Other results:  Imaging: DG CHEST PORT 1 VIEW  Result Date: 06/02/2021 CLINICAL DATA:  Hypoxia EXAM: PORTABLE CHEST 1 VIEW COMPARISON:  June 06, 2021 FINDINGS: Endotracheal tube tip is 5.9 cm above the carina. Nasogastric tube tip and side port below the diaphragm. Impella device unchanged in position. ECMO catheters unchanged in position. There is a left chest tube and a mediastinal drain. Swan-Ganz catheter tip in right main pulmonary artery. No pneumothorax. There is a left pleural effusion. There is less interstitial edema compared to 1 day prior. No consolidation. There is stable cardiomegaly. Pulmonary vascularity is stable with a degree of pulmonary venous hypertension. Status post coronary artery bypass grafting. No evident adenopathy. No bone lesions appreciable. IMPRESSION: Tube and catheter positions as described. No pneumothorax evident. Slightly less interstitial edema compared to 1 day prior. Persistent left pleural effusion. Stable cardiomegaly. Electronically Signed   By: Lowella Grip III M.D.   On: 05/25/2021 08:11   EEG adult  Result Date: 06/06/2021 Lora Havens, MD     06/06/2021 12:16 PM Patient Name: LAYONNA DOBIE MRN: [id number] Epilepsy Attending: Lora Havens Referring Physician/Provider: Dr Kipp Brood Date: 06/06/2021 Duration: 25.04 mins Patient history: 60 year old female  status post cardiac arrest.  EEG done for seizures. Level of alertness: comatose AEDs during EEG study: None Technical aspects: This EEG study was done with scalp electrodes positioned according to the 10-20 International system of electrode placement. Electrical activity was acquired at a sampling rate of '500Hz'  and reviewed with a high frequency filter of '70Hz'  and a low frequency filter of  '1Hz' . EEG data were recorded continuously and digitally stored. Description: EEG showed continuous generalized 3 to 6 Hz theta-delta slowing.  EEG was reactive to noxious stimulation. Hyperventilation and photic stimulation were not performed.   ABNORMALITY - Continuous slow, generalized IMPRESSION: This study is suggestive of severe diffuse encephalopathy, nonspecific etiology. No seizures or epileptiform discharges were seen throughout the recording. Priyanka Barbra Sarks     Medications:     Scheduled Medications:  sodium chloride   Intravenous Once   sodium chloride   Intravenous Once   sodium chloride   Intravenous Once   sodium chloride   Intravenous Once   acetaminophen (TYLENOL) oral liquid 160 mg/5 mL  1,000 mg Per Tube Q6H   aspirin  324 mg Per Tube Daily   bisacodyl  10 mg Rectal Daily   chlorhexidine gluconate (MEDLINE KIT)  15 mL Mouth Rinse BID   Chlorhexidine Gluconate Cloth  6 each Topical Daily   docusate  200 mg Per Tube Daily   mouth rinse  15 mL Mouth Rinse 10 times per day   pantoprazole sodium  40 mg Per Tube Daily   rosuvastatin  40 mg Per Tube q1800   sodium chloride flush  10-40 mL Intracatheter Q12H   sodium chloride flush  3 mL Intravenous Q12H    Infusions:  sodium chloride 10 mL/hr at 06/03/2021 0900   sodium chloride     sodium chloride     sodium chloride     sodium chloride     sodium chloride 10 mL/hr at 06/08/21 0800   sodium chloride     albumin human 12.5 g (06/08/21 0801)   amiodarone 30 mg/hr (06/08/21 0800)    ceFAZolin (ANCEF) IV Stopped (06/08/21 4098)   dexmedetomidine (PRECEDEX) IV infusion 0.7 mcg/kg/hr (06/08/21 0800)   epinephrine 6 mcg/min (06/08/21 0800)   feeding supplement (PIVOT 1.5 CAL) 20 mL/hr at 05/30/2021 1325   heparin 25 units/mL (Impella PURGE) in dextrose 5 % 1000 mL bag     heparin 300 Units/hr (06/08/21 0800)   HYDROmorphone 8 mg/hr (06/08/21 0800)   insulin 2.6 Units/hr (06/08/21 0800)   lactated ringers     lactated  ringers 10 mL/hr at 06/08/21 0800   lactated ringers     milrinone 0.25 mcg/kg/min (06/08/21 0800)   norepinephrine (LEVOPHED) Adult infusion 8 mcg/min (06/08/21 0800)    PRN Medications: sodium chloride, sodium chloride, sodium chloride, albumin human, dextrose, HYDROmorphone, lactated ringers, metoprolol tartrate, midazolam, ondansetron (ZOFRAN) IV, sodium chloride flush, sodium chloride flush   Assessment/Plan   Cardiogenic shock in setting of left main dissection and emergent CABG - chest open on V-A ECMO with Impella vent. S/p chest washout 6/17 - continue epi, NE and milrinone to maintain MAP 70s. Requiring volume. Will continue with albumin and RBC support. Hold off on diuresis  - PA pressures low - Continue VA ECMO support. See ECMO note for parameters - Impella at P-1 for vent. Good waveforms. Groin site ok.  - Continue supportive care today. Possible TEE tomorrow to reassess cardiac function.  - D/w Dr. Roxan Hockey.  - Suspect will begin wean on Monday and  plan wean to Impella support only as tolerated  2. CAD with acute due to left main dissection - plan as above - no evidence of ongoing ischemia  - continue ASA/statin  3. Acute respiratory failure due to above - ABG ok. Oxygenation is good - Vent per CCM  4. ECMO circuit - back on heparin. Running level low - Discussed dosing with PharmD personally. - goal HL 0.2-0.3  5. AKI - due to shock/ATN - SCr improved to 1.6 - continue support  6. Hypomag/hypokalemia - supp as needed  7. Anemia due to expected post-operative blood loss and ECMO circuit - transfuse to keep hgb >= 8.0. Will give 1u this am - no obvious sights of bleeding CTs stilla bit oozy but improved  8. Thrombocytopenia - due to critical illness. improving   CRITICAL CARE Performed by: Glori Bickers  Total critical care time: 45 minutes  Critical care time was exclusive of separately billable procedures and treating other  patients.  Critical care was necessary to treat or prevent imminent or life-threatening deterioration.  Critical care was time spent personally by me (independent of midlevel providers or residents) on the following activities: development of treatment plan with patient and/or surrogate as well as nursing, discussions with consultants, evaluation of patient's response to treatment, examination of patient, obtaining history from patient or surrogate, ordering and performing treatments and interventions, ordering and review of laboratory studies, ordering and review of radiographic studies, pulse oximetry and re-evaluation of patient's condition.    Length of Stay: 3   Glori Bickers MD 06/08/2021, 8:21 AM  Advanced Heart Failure Team Pager [phone] (M-F; 7a - 4p)  Please contact [facility] for night-coverage after hours (4p -7a ) and weekends on [email or web address]

## 2021-06-08 NOTE — Progress Notes (Signed)
21 mL of dilaudid wasted in stericycle. Witnessed by Lorrin Jackson, RN.

## 2021-06-08 NOTE — Progress Notes (Signed)
ECMO PROGRESS NOTE  NAME:  [name] [name], MRN:  [id number], DOB:  01-02-1961, LOS: 3 ADMISSION DATE:  06/02/2021, CONSULTATION DATE: 06/03/2021 REFERRING MD: Roxan Hockey, CHIEF COMPLAINT: VA ECMO  HPI/course in hospital   60 year old woman with hx of HTN, HLD, hypothyroidism, CVA, DM2 who presented with angina.  Underwent cardiac cath which showed left main dissection, during this procedure became acutely unstable with crushing chest pain and shock.  Taken emergently to OR for bypass after 3-5 impella placed.  Tough to wean from bypass so left centrally cannulated on VA ECMO and sent to [place].  PCCM consulted to help with management.  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 Cristopher Peru)   Thyroid disease    PROCEDURE FOR THYROID 15 YRS AGO AT [facility]   WPW (Wolff-Parkinson-White syndrome)      Past Surgical History:  Procedure Laterality Date   BREAST SURGERY     BX RT BREAST  BENIGN   CORONARY ARTERY BYPASS GRAFT N/A 06/01/2021   Procedure: CORONARY ARTERY BYPASS GRAFTING (CABG)X2.USING  LEFT INTERNAL MAMMARY ARTERY AND  RIGHT ENDOSCOPIC SAPHENOUS VEIN HARVESTING. 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 Christella Noa;  Surgeon: Kevan Ny Ditty, MD;  Location: [place];  Service: Neurosurgery;  Laterality: Right;   ELECTROPHYSIOLOGIC STUDY     LAPAROSCOPIC REVISION VENTRICULAR-PERITONEAL (V-P) SHUNT N/A 02/04/2016   Procedure: LAPAROSCOPIC Insertion VENTRICULAR-PERITONEAL (V-P) SHUNT;  Surgeon: Rolm Bookbinder, MD;  Location: [place] [facility];  Service: General;  Laterality: N/A;   LEFT HEART CATH AND CORONARY ANGIOGRAPHY N/A 06/16/2021   Procedure: LEFT HEART CATH AND CORONARY ANGIOGRAPHY;  Surgeon: Jettie Booze, MD;  Location: [place] CV [facility];  Service: Cardiovascular;  Laterality: N/A;   RADIOLOGY WITH ANESTHESIA N/A 01/03/2016   Procedure: RADIOLOGY WITH ANESTHESIA;  Surgeon: Medication Radiologist, MD;  Location: [place];  Service: Radiology;  Laterality: N/A;   TEE WITHOUT CARDIOVERSION N/A 06/02/2021   Procedure: TRANSESOPHAGEAL ECHOCARDIOGRAM (TEE);  Surgeon: Melrose Nakayama, MD;  Location: [facility];  Service: Open Heart Surgery;  Laterality: N/A;   VENTRICULAR ASSIST DEVICE INSERTION N/A 05/28/2021   Procedure: VENTRICULAR ASSIST DEVICE INSERTION;  Surgeon: Jettie Booze, MD;  Location: [place] CV LAB;  Service: Cardiovascular;  Laterality: N/A;   VENTRICULOPERITONEAL SHUNT Right 02/04/2016   Procedure: SHUNT INSERTION VENTRICULAR-PERITONEAL With Laparoscopic Assistance;  Surgeon: Kevan Ny Ditty, MD;  Location: [place] [facility];  Service: Neurosurgery;  Laterality: Right;     Significant Hospital events:  6/14 admitted 6/15 spontaneous artery dissection left main on cath.CP Impella  placed.   6/15 emergent coronary artery bypass graft, LIMA to LAD, SVG to OM.  Brief cardiac arrest of less than 10 minutes immediately prior to initiation of cardiopulmonary bypass. 6/15 unable to wean.  Still unable to wean from bypass so placed on centrally  cannulated VA ECMO. 6/16 EEG shows encephalopathy.  6/16 Cold right hand - seen by vascular, conservative management 6/17 Hand improved. 725m clot removed with washout at the bedside   Interim history/subjective:   Improved hemodynamics following washout. Still volume seeking.  Transfused 1 unit yesterday.   Objective   Blood pressure (!) 76/64, pulse 82, temperature (!) 97.52 F (36.4 C), resp. rate 15, height _0  (1.6 m), weight 94.9 kg, SpO2 100 %. PAP: (15-28)/(9-18) 26/15 CVP:  [4 mmHg-14 mmHg] 13 mmHg  Vent Mode: PRVC FiO2 (%):  [30 %] 30 % Set Rate:  [15 bmp] 15 bmp Vt Set:  [310 mL] 310 mL PEEP:  [5 cmH20] 5 cmH20 Plateau Pressure:  [15 cmH20-23 cmH20] 23 cmH20   Intake/Output Summary (Last 24 hours) at 06/08/2021 0839 Last data filed at 06/08/2021 0800 Gross per 24 hour  Intake 4452.16 ml  Output 2505 ml  Net 1947.16 ml    Filed Weights   06/06/21 0500 05/30/2021 0500 06/08/21 0500  Weight: 94.7 kg 93.1 kg 94.9 kg    ECMO Device: Cardiohelp  ECMO Mode: VA  Flow (LPM): 3.74   Physical Exam Constitutional:      Appearance: She is obese.  HENT:     Head: Normocephalic.  Eyes:     Comments: sclerae intact Neck:     Comments: ET tube and OG tube in place Cardiovascular:     Rate and Rhythm: Normal rate and regular rhythm.     Heart sounds: Normal heart sounds.     Comments: Open chest with central ECMO cannulas, Dacron spacer is concave.  All extremities warm. Mild generalized edema. Pulmonary:     Breath sounds: Normal vesicular breath sounds with occasional rhonchi.     Comments: Mechanically ventilated with acceptable airway pressures. Abdominal:     Palpations: Abdomen is soft.  Skin:    General: Skin is warm and dry.     Capillary Refill: Capillary refill takes 2 to 3 seconds.  Neurological:     Comments: Sedated.  Moves all limbs spontaneously and will open eyes to voice.  Follows commands.     Ancillary tests (personally reviewed)   CBC: Recent Labs  Lab 06/06/21 1810 06/06/21 2015 05/24/2021 0232 06/02/2021 0237 06/20/2021 0919 06/18/2021 1613 05/22/2021 1618 05/26/2021 2018 06/08/21 0302 06/08/21 0304  WBC 8.2  --  7.8  --  7.7 7.6  --   --  8.2  --   HGB 7.8*   < > 7.5*   < > 8.3* 7.1* 6.5* 8.5* 8.2* 7.5*  HCT 23.9*   < > 21.9*   < > 24.2* 22.0* 19.0* 25.0* 24.7* 22.0*  MCV 86.6  --  84.9  --  84.3 85.6  --   --  88.8  --   PLT 67*  --  50*  --  44* 88*  --   --  57*  --    < > = values in this interval not displayed.     Basic Metabolic Panel: Recent Labs  Lab 06/16/2021 0450 05/25/2021 1506 06/19/2021 2055 06/06/21 0012 06/06/21 0402 06/06/21 0694806/16/22 1546 06/06/21 1557 06/02/2021 0232 05/26/2021 0237 06/06/2021 1613 06/11/2021 1618 05/29/2021 2018 06/08/21 0302 06/08/21 0304  NA  139   < > 144  142   < > 139   < > 141   < > 142   < > 141 144 145 139 143  K 3.8   < > 3.1*  2.9*   < > 3.4*   < > 3.7   < > 4.3   < > 4.6 4.7 4.8 5.0 5.1  CL 110   < > 112*  --  112*  --  113*  --  114*  --  116*  --   --  115*  --   CO2 23  --  17*  --  17*  --  20*  --  22  --  22  --   --  21*  --   GLUCOSE 81   < > 207*  --  251*  --  178*  --  177*  --  143*  --   --  150*  --   BUN 16   < > 13  --  14  --  14  --  14  --  12  --   --  14  --   CREATININE 1.50*   < > 1.76*  --  1.91*  --  1.90*  --  1.93*  --  1.66*  --   --  1.59*  --   CALCIUM 8.9  --  7.4*  --  7.3*  --  7.4*  --  7.6*  --  7.5*  --   --  7.2*  --   MG 1.9  --  1.8  --  2.6*  --  2.0  --   --   --   --   --   --   --   --    < > = values in this interval not displayed.    GFR: Estimated Creatinine Clearance: 41.2 mL/min (A) (by C-G formula based on SCr of 1.59 mg/dL (H)). Recent Labs  Lab 06/09/2021 2055 06/06/21 0402 06/06/21 1810 06/06/2021 0232 06/15/2021 0919 06/20/2021 1613 06/08/21 0302  WBC 11.3* 9.9   < > 7.8 7.7 7.6 8.2  LATICACIDVEN 7.5* 5.1*  --  1.2  --   --   --    < > = values in this interval not displayed.     Liver Function  Tests: Recent Labs  Lab 06/02/2021 2055 06/06/21 0402 05/28/2021 0232 06/08/21 0302  AST 889* 771* 212* 87*  ALT 290* 221* 53* 16  ALKPHOS 25* 26* 22* 28*  BILITOT 0.7 0.7 0.7 1.1  PROT 3.6* 3.8* 4.2* 3.7*  ALBUMIN 2.2* 2.6* 3.2* 2.4*    No results for input(s): LIPASE, AMYLASE in the last 168 hours. No results for input(s): AMMONIA in the last 168 hours.  ABG    Component Value Date/Time   PHART 7.365 06/08/2021 0304   PCO2ART 40.1 06/08/2021 0304   PO2ART 427 (H) 06/08/2021 0304   HCO3 23.1 06/08/2021 0304   TCO2 24 06/08/2021 0304   ACIDBASEDEF 2.0 06/08/2021 0304   O2SAT 100.0 06/08/2021 0304      Coagulation Profile: Recent Labs  Lab 06/01/2021 2230 06/06/21 0402 06/03/2021 0232 06/08/21 0302  INR 1.6* 1.7* 1.5* 1.4*     Cardiac Enzymes: No results for input(s): CKTOTAL, CKMB, CKMBINDEX, TROPONINI in the last 168 hours.  HbA1C: Hgb A1c MFr Bld  Date/Time Value Ref Range Status  05/28/2021 10:03 AM 5.4 4.8 - 5.6 % Final    Comment:    (  NOTE)         Prediabetes: 5.7 - 6.4         Diabetes: >6.4         Glycemic control for adults with diabetes: <7.0     CBG: Recent Labs  Lab 05/31/2021 1613 06/20/2021 1703 06/20/2021 1807 06/08/21 0612 06/08/21 0657  GLUCAP 138* 141* 114* 188* 153*    Assessment & Plan:  Critically ill due to cardiogenic shock secondary to spontaneous coronary dissection of the left main requiring VA ECMO support and titration of vasopressors and inotropes. CP Impella to offload LV. Status post two-vessel bypass AKI due to hypotension - creatinine improving.  Shock liver due to hypotension - improving Multifactorial thrombocytopenia - improving Status post PEA cardiac arrest Uncontrolled type 2 diabetes  Plan:  -Titrate norepinephrine to keep MAP greater than 65 - stable NE requirement. Sill mildly vasoplegic following CPB run and ECMO.  -Continue epinephrine and milrinone at current doses. -Maintain current ECMO flow.  Observe  for spontaneous recovery and return of pulsatility.  Plan to wean to Impella Monday pending TEE Sunday.  -Continue rest ventilator settings.  Acceptable gas exchange. -Anticipate shorter ECMO run with possible decannulation and chest closure in 48 to 72 hours.  Daily Goals Checklist  Pain/Anxiety/Delirium protocol (if indicated): Dilaudid and dexmedetomidine to keep RASS goal -3 VAP protocol (if indicated): Bundle in place Respiratory support goals: Rest ventilator settings.  No weaning at this time. Blood pressure target: MAP greater than 65, normal cardiac output. DVT prophylaxis: On systemic heparin for VA ECMO Nutritional status and feeding goals: er increase to goal tomorrow. GI prophylaxis: Pantoprazole.  Fluid status goals: No diuresis today  Urinary catheter: Assessment of intravascular volume Central lines: Right IJ MAC introducer, left radial art line Glucose control: Uncontrolled type 2 diabetes on insulin infusion, adjust per Endo tool. Mobility/therapy needs: Bedrest Antibiotic de-escalation: Perioperative antibiotics Home medication reconciliation: On hold Daily labs: Per ECMO protocol Code Status: Full code Family Communication: Family to be updated over the phone.  Disposition: ICU  CRITICAL CARE Performed by: Kipp Brood  Total critical care time: 45 minutes  Critical care time was exclusive of separately billable procedures and treating other patients.  Critical care was necessary to treat or prevent imminent or life-threatening deterioration.  Critical care was time spent personally by me on the following activities: development of treatment plan with patient and/or surrogate as well as nursing, discussions with consultants, evaluation of patient's response to treatment, examination of patient, obtaining history from patient or surrogate, ordering and performing treatments and interventions, ordering and review of laboratory studies, ordering and review of  radiographic studies, pulse oximetry, re-evaluation of patient's condition and participation in multidisciplinary rounds.  Kipp Brood, MD [facility] ICU Physician [place]  Pager: [phone] Mobile: [phone] After hours: [phone].   06/08/2021, 8:39 AM

## 2021-06-08 NOTE — Progress Notes (Signed)
ECMO NOTE:   Indication: Cardiogenic shock    Initial cannulation date: 06/03/2021   ECMO type: VA ECMO   Cannulation:   Central cannulation Asc Ao & RA   ECMO events:   - Initial cannulation 06/06/2021   Daily data:   Flow 3.7L RPM 2800 Sweep  4.0 L   Labs:   ABG    Component Value Date/Time   PHART 7.365 06/08/2021 0304   PCO2ART 40.1 06/08/2021 0304   PO2ART 427 (H) 06/08/2021 0304   HCO3 23.1 06/08/2021 0304   TCO2 24 06/08/2021 0304   ACIDBASEDEF 2.0 06/08/2021 0304   O2SAT 100.0 06/08/2021 0304    Hgb 7.5 -> transfusing Platelets 104 -> 99 -> 50 -> 58 LDH  1876 -> 980 -> 580  Heparin level: 0.21 Lactic acid 7.5 -> 5.1 -> 1.2    Plan:  - Continue VA ECMO with Impella vent at P-1 (waveforms ok) - Titrate gtts to MAP 70-90 - Adjust heparin with HL goal 0.2-0.3 - Transfuse to keep Hgb >8.0 - Continue support over the w/e. ? Wean on Monday. - Wean to Impella  Glori Bickers, MD  8:33 AM

## 2021-06-08 NOTE — Op Note (Signed)
NAME: [name] [name], [name] [name] MEDICAL RECORD NO: [id number] ACCOUNT NO: [id number] DATE OF BIRTH: 01-23-1961 FACILITY: [facility] LOCATION: [facility] PHYSICIAN: Revonda Standard. Roxan Hockey, MD  Operative Report   DATE OF PROCEDURE: 05/30/2021  PREOPERATIVE DIAGNOSIS:  Hemothorax, on extracorporeal membrane oxygenation.  POSTOPERATIVE DIAGNOSIS:  Hemothorax, on extracorporeal membrane oxygenation.  PROCEDURE:  Reexploration of mediastinum for drainage of hemothorax and washout of mediastinal clot.  SURGEON:  Modesto Charon, MD  ASSISTANT: Alcide Evener, RNFA  ANESTHESIA:  General.  FINDINGS:  Approximately 750 mL of clot and blood in the mediastinum and left pleural space, no active bleeding noted.  CLINICAL NOTE: [name] is a 60 year old woman who presented initially with a non-ST elevation MI.  She was found to have spontaneous dissection of her left main with conversion to a STEMI complicated by cardiogenic shock.  She was taken emergently to the  operating room for bypass grafting x2 and failed to wean from bypass and was supported with extracorporeal membrane oxygenation.  The sternal wound was left open and closed with Esmarch.  She has had increased opacity in the left chest and likely has  clot in the mediastinum.  Plan was to do reexploration to clear out old blood and clot.  The patient's sister gave consent as the patient is intubated with uncertain neurologic status.  OPERATIVE NOTE: [name] is in the ICU, on sedation with Precedex.  She was given Versed and Dilaudid intravenously.  The chest was prepped and draped in sterile fashion.  The old Esmarch covering was removed as were the sutures.  There was clot in  the anterior mediastinum, which was removed.  The left side of the sternum was gently lifted and a sucker was placed into the left pleural space and evacuated approximately 700 mL of bloody fluid.  Inspection revealed no significant bleeding around the cannulation  sites and there  was no sign of active bleeding.  The pleural space and mediastinum were copiously irrigated with saline using a bulb syringe.  The tubes were cleared, tubes were placed back into their locations.  An Esmarch dressing was  reapplied over the sternal wound with a running 3-0 Prolene suture.  All sponge, needle and instrument counts were correct.  The patient remained in the ICU in critical condition.   SHW D: 06/02/2021 5:49:56 pm T: 06/08/2021 1:05:00 am  JOB: [id number]/ [id number]

## 2021-06-08 NOTE — Progress Notes (Signed)
[place] for IV Heparin Indication:  ECMO and Impella  No Known Allergies  Patient Measurements: Height: 5\' 3"  (160 cm) Weight: 94.9 kg (209 lb 3.5 oz) IBW/kg (Calculated) : 52.4 Heparin Dosing Weight: 72 kg  Vital Signs: Temp: 97.34 F (36.3 C) (06/18 1826) Temp Source: Core (06/18 1600) BP: 83/71 (06/18 1514) Pulse Rate: 78 (06/18 1826)  Labs: Recent Labs    06/06/21 0402 06/06/21 0510 06/06/21 1546 06/06/21 1557 06/16/2021 0232 05/22/2021 0237 06/08/2021 0919 05/25/2021 1613 05/31/2021 1618 06/08/21 0302 06/08/21 0304 06/08/21 1613 06/08/21 1614  HGB 10.9*   < >  --    < > 7.5*   < > 8.3* 7.1*   < > 8.2* 7.5* 10.1* 9.2*  HCT 33.7*   < >  --    < > 21.9*   < > 24.2* 22.0*   < > 24.7* 22.0* 29.8* 27.0*  PLT 99*  --   --    < > 50*  --  44* 88*  --  57*  --  38*  --   APTT >200*   < > >200*  --   --   --  189*  --   --  173*  --   --   --   LABPROT 19.6*  --   --   --  18.5*  --   --   --   --  16.7*  --   --   --   INR 1.7*  --   --   --  1.5*  --   --   --   --  1.4*  --   --   --   HEPARINUNFRC  --    < > 0.32   < >  --   --  0.16* 0.27*  --  0.21*  --  0.12*  --   CREATININE 1.91*  --  1.90*  --  1.93*  --   --  1.66*  --  1.59*  --  1.51*  --    < > = values in this interval not displayed.     Estimated Creatinine Clearance: 43.4 mL/min (A) (by C-G formula based on SCr of 1.51 mg/dL (H)).   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 Cristopher Peru)   Thyroid disease    PROCEDURE FOR THYROID 15 YRS AGO AT [facility]   WPW (Wolff-Parkinson-White syndrome)     Medications:  Infusions:   sodium chloride 10 mL/hr at 06/03/2021 0900   sodium chloride     sodium chloride     sodium chloride     sodium  chloride     sodium chloride 10 mL/hr at 06/08/21 1800   sodium chloride     albumin human Stopped (06/08/21 0901)   amiodarone 30 mg/hr (06/08/21 1800)    ceFAZolin (ANCEF) IV Stopped (06/08/21 1342)   dexmedetomidine (PRECEDEX) IV infusion 0.7 mcg/kg/hr (06/08/21 1800)   epinephrine 7 mcg/min (06/08/21 1800)   feeding supplement (PIVOT 1.5 CAL) 1,000 mL (06/08/21 1046)   heparin 25 units/mL (Impella PURGE) in dextrose 5 % 1000 mL bag     heparin 200 Units/hr (06/08/21 1800)   HYDROmorphone 8 mg/hr (06/08/21 1800)  insulin 2.4 Units/hr (06/08/21 1800)   lactated ringers     lactated ringers 10 mL/hr at 06/08/21 1800   lactated ringers     midazolam 4 mg/hr (06/08/21 1800)   milrinone 0.25 mcg/kg/min (06/08/21 1800)   norepinephrine (LEVOPHED) Adult infusion 6 mcg/min (06/08/21 1800)    Assessment:  32 yoF admitted with cardiogenic shock 2/2 LM SCAD. Pt s/p CABG with VA-ECMO and Impella placement. Heparin started systemically and via purge (50 units/ml). Heparin levels elevated on purge alone so concentration reduced to half-strength (25 units/ml).  Heparin level 0.12 this evening on systemic heparin drip 200 uts/hr and purge heparin 52ml/hr = 400 utshr (purge solution 25uts heparin/ml)  Total heparin 600 uts/hr  H/H stable 9/27 but pltc fell 50>30s  R hand hematoma improved.  Continues to ooze at cannulation site from movement earlier today    Goal of Therapy:  Heparin level 0.2-0.5 - keep on low end for now  Monitor platelets by anticoagulation protocol: Yes   Plan:  -Continue half-strength heparinized purge (25 units/ml) -Continue systemic heparin at 200 units/h -Check heparin level and CBC q12h    Bonnita Nasuti Pharm.D. CPP, BCPS Clinical Pharmacist [phone] 06/08/2021 6:46 PM   Please check AMION for all [place] numbers 06/08/2021

## 2021-06-08 NOTE — Progress Notes (Signed)
  Patient awoke and lifted arms above her head.   Immediately developed bleeding at central cannulation sites with active bleeding coming out under Esmarch. Pressure held. Cannulas pushed forward.   Flow on ecmo pump stable. Patient sedated. Epi increased and RBCs infused.   Dr. Roxy Manns paged to bedside to evalaute. On arrival, bleeding had stopped.   CTs underwent [place] declotting.   Patient stabilized.   Will follow closely. Keep sedated.   Additional CCT 45 mins.   Glori Bickers, MD  10:18 AM

## 2021-06-08 NOTE — Progress Notes (Signed)
[facility] BRIEF SICU PROGRESS NOTE  1 Day Post-Op  S/P Procedure(s) (LRB): Reexploration and evacuation of hematoma (N/A)   Stable day Maintaining NSR w/ stable ECMO and Impella vent flows, PA pressures relatively low Mild drainage around Esmarch where cannulas exit, dressing flat and chest tubes draining well since clots were removed - approx 50 mL/hr but Hgb up 10.1 after transfusion Platelet count has drifted further down 38k  Plan: I agree w/ plans for transfusion of platelets.  Continue low dose heparin for now but watch closely  Rexene Alberts, MD 06/08/2021 5:24 PM

## 2021-06-08 NOTE — Op Note (Signed)
NAME: [name] [name], GOULART MEDICAL RECORD NO: [id number] ACCOUNT NO: [id number] DATE OF BIRTH: 27-Feb-1961 FACILITY: [facility] LOCATION: [facility] PHYSICIAN: Revonda Standard. Roxan Hockey, MD  Operative Report   DATE OF PROCEDURE: 05/30/2021  PREOPERATIVE DIAGNOSIS:  Spontaneous coronary artery dissection of the left main with cardiogenic shock.  POSTOPERATIVE DIAGNOSIS:  Spontaneous coronary artery dissection of the left main with cardiogenic shock.  PROCEDURES PERFORMED:   Median sternotomy, extracorporeal circulation,  Emergency coronary artery bypass grafting x 2  Left internal mammary artery to LAD,  Saphenous vein graft to obtuse marginal Endoscopic vein harvest right thigh, Cannulation and initiation of extracorporeal membrane oxygenation.  SURGEON:  Modesto Charon, MD  ASSISTANT:  Ellwood Handler, PA-C.  FINDINGS:  [name] [name] was in cardiogenic shock on transport to OR.  VFib arrest during sternotomy, extensive dissection of the coronaries with poor quality target vessels.  Good quality conduits. Failed to wean from bypass with Impella support, required  initiation of ECMO.  CLINICAL NOTE:  [name] [name] is a 60 year old woman who had presented with a non-ST elevation MI.  She underwent cardiac catheterization on 06/01/2021, and was noted to have a left main dissection. During the procedure, her distal vessels shut down and she  began having crushing chest pain and developed immediate distress and cardiogenic shock.  She was advised to undergo emergent bypass grafting.  She consented verbally with multiple witnesses.  She was intubated and an Impella was placed via the right  groin.  She then was taken emergently to the operating room.  The chest, abdomen and legs were prepped and draped in the usual sterile fashion.  Timeout was performed.  A median sternotomy was performed.  Just before dividing the sternum with the saw,  the patient went into ventricular fibrillation.  Sternal retractor was  placed.  Open massage was initiated.  The patient was defibrillated multiple times and then had return of spontaneous circulation.  She was fully heparinized.  The ascending aorta was  cannulated via concentric 2-0 Ethibond pledgeted pursestring sutures.  A dual stage venous cannula was placed via pursestring suture in the right atrium.  Cardiopulmonary bypass was initiated.  Anticoagulation was monitored with ACT measurement.  Flows  were maintained per protocol. While the sternotomy was being performed, an incision was made in the medial aspect of the left leg and the greater saphenous vein was harvested endoscopically.  The internal mammary artery was taken down rapidly.  It was a  good quality vessel as was the portion of the vein that was used for the OM graft.  A retrograde cardioplegia cannula was placed via a pursestring suture in the right atrium and directed into the coronary sinus.  Antegrade cardioplegia cannula was placed  in the ascending aorta.  Temperature probe was placed in the myocardial septum.  The aorta was cross clamped. Cardiac arrest was achieved with a combination of cold antegrade and retrograde blood cardioplegia and topical iced saline.  An initial 500 mL of plegia was given antegrade and then a liter was given retrograde.  After  achieving a complete diastolic arrest and septal cooling to 10 degrees Celsius, the distal anastomoses were performed.  A reversed saphenous vein graft was placed end-to-side to the obtuse marginal.  This was a 1.5 mm poor quality target vessel.  The dissection came up just to the site of the anastomosis, stopping just proximal to that.  The anastomosis was performed with a  running 7-0 Prolene suture.  A 1.5 mm probe did pass distally.  Cardioplegia  was administered down the graft.  There was good flow and good hemostasis.  The left internal mammary artery was brought through a window in the pericardium.  The distal end was bevelled.  It was  anastomosed end-to-side to the distal LAD.  The LAD was dissected throughout its course.  The intima was tacked to the adventitia  with the anastomotic sutures.  At the completion of the mammary to LAD anastomosis, the bulldog clamp was removed.  Initially, there was good hemostasis and septal rewarming was noted.  The bulldog clamp was replaced.  Additional cardioplegia was  administered.  The vein graft was cut to length.  The cardioplegia cannula was removed from the ascending aorta.  The proximal vein graft anastomosis was performed to a 4.5 mm punch aortotomy with a running 6-0 Prolene suture.  At the completion of the  proximal anastomosis, a warm dose of retrograde cardioplegia was administered.  The patient was placed in Trendelenburg position.  Lidocaine was administered.  The aortic root was deaired and the aortic crossclamp was removed.  The total crossclamp time  was 44 minutes.  The patient did resume a rhythm spontaneously.  This was a bradycardic rhythm.  The proximal and distal anastomoses were inspected for hemostasis.  There was some bleeding from the heel of the mammary to LAD anastomosis, which was  repaired with 8-0 Prolene suture.  Epicardial pacing wires were placed on the right ventricle and right atrium.  When the patient had rewarmed to a core temperature of 37 degrees Celsius, an attempt was made to wean from cardiopulmonary bypass with  Impella support.  The patient did come off bypass.  Initially, there was minimal pulsatile flow.  The left ventricle was grossly hypokinetic, although decompressed by the Impella device.  The pulmonary artery pressures and CVP gradually rose and the  decision was made to bring the patient off bypass on arteriovenous extracorporeal membrane oxygenation.  The ECMO device was primed and the lines were passed to the table.  The patient was again weaned from bypass with Impella support. The venous line  and then the arterial line were connected to the  ECMO circuit and VA ECMO was instituted with flows in the 3.5-4 liters range.  The Impella flows remained in the 1 liter range.  Left pleural chest tube was placed and 2 mediastinal drains were placed, one  in the posterior pericardium and one anteriorly.  The cannulae were secured with heavy silk sutures.  No attempt was made to close the sternum given central cannulation.  Esmarch was sewn over the sternal wound with running 3-0 Vicryl sutures.  All  sponge, needle and instrument counts were correct at the end of the procedure.  The patient was taken from the operating room to the surgical intensive care unit on full support, intubated and in critical condition.   SHW D: 06/06/2021 5:44:11 pm T: 06/08/2021 12:41:00 am  JOB: [id number]/ [id number]

## 2021-06-09 ENCOUNTER — Inpatient Hospital Stay (HOSPITAL_COMMUNITY): Payer: 59

## 2021-06-09 DIAGNOSIS — R570 Cardiogenic shock: Secondary | ICD-10-CM | POA: Diagnosis not present

## 2021-06-09 DIAGNOSIS — I313 Pericardial effusion (noninflammatory): Secondary | ICD-10-CM

## 2021-06-09 DIAGNOSIS — I213 ST elevation (STEMI) myocardial infarction of unspecified site: Secondary | ICD-10-CM

## 2021-06-09 DIAGNOSIS — R079 Chest pain, unspecified: Secondary | ICD-10-CM | POA: Diagnosis not present

## 2021-06-09 LAB — BPAM PLATELET PHERESIS
Blood Product Expiration Date: 202206192359
Blood Product Expiration Date: 202206192359
ISSUE DATE / TIME: 202206181737
ISSUE DATE / TIME: 202206181737
Unit Type and Rh: 6200
Unit Type and Rh: 6200

## 2021-06-09 LAB — TYPE AND SCREEN
ABO/RH(D): O POS
Antibody Screen: NEGATIVE
Unit division: 0
Unit division: 0
Unit division: 0
Unit division: 0
Unit division: 0
Unit division: 0
Unit division: 0
Unit division: 0
Unit division: 0
Unit division: 0
Unit division: 0
Unit division: 0
Unit division: 0
Unit division: 0
Unit division: 0
Unit division: 0
Unit division: 0
Unit division: 0
Unit division: 0
Unit division: 0

## 2021-06-09 LAB — BPAM RBC
Blood Product Expiration Date: 202207012359
Blood Product Expiration Date: 202207012359
Blood Product Expiration Date: 202207122359
Blood Product Expiration Date: 202207122359
Blood Product Expiration Date: 202207122359
Blood Product Expiration Date: 202207122359
Blood Product Expiration Date: 202207122359
Blood Product Expiration Date: 202207132359
Blood Product Expiration Date: 202207132359
Blood Product Expiration Date: 202207152359
Blood Product Expiration Date: 202207152359
Blood Product Expiration Date: 202207152359
Blood Product Expiration Date: 202207152359
Blood Product Expiration Date: 202207152359
Blood Product Expiration Date: 202207162359
Blood Product Expiration Date: 202207162359
Blood Product Expiration Date: 202207162359
Blood Product Expiration Date: 202207162359
Blood Product Expiration Date: 202207162359
Blood Product Expiration Date: 202207162359
ISSUE DATE / TIME: 202206151539
ISSUE DATE / TIME: 202206151539
ISSUE DATE / TIME: 202206151609
ISSUE DATE / TIME: 202206151609
ISSUE DATE / TIME: 202206151827
ISSUE DATE / TIME: 202206152336
ISSUE DATE / TIME: 202206160804
ISSUE DATE / TIME: 202206161049
ISSUE DATE / TIME: 202206162024
ISSUE DATE / TIME: 202206170349
ISSUE DATE / TIME: 202206171140
ISSUE DATE / TIME: 202206171140
ISSUE DATE / TIME: 202206171140
ISSUE DATE / TIME: 202206171732
ISSUE DATE / TIME: 202206171827
ISSUE DATE / TIME: 202206180835
ISSUE DATE / TIME: 202206180937
ISSUE DATE / TIME: 202206180937
Unit Type and Rh: 5100
Unit Type and Rh: 5100
Unit Type and Rh: 5100
Unit Type and Rh: 5100
Unit Type and Rh: 5100
Unit Type and Rh: 5100
Unit Type and Rh: 5100
Unit Type and Rh: 5100
Unit Type and Rh: 5100
Unit Type and Rh: 5100
Unit Type and Rh: 5100
Unit Type and Rh: 5100
Unit Type and Rh: 5100
Unit Type and Rh: 5100
Unit Type and Rh: 5100
Unit Type and Rh: 5100
Unit Type and Rh: 5100
Unit Type and Rh: 5100
Unit Type and Rh: 5100
Unit Type and Rh: 5100

## 2021-06-09 LAB — BASIC METABOLIC PANEL
Anion gap: 7 (ref 5–15)
Anion gap: 4 — ABNORMAL LOW (ref 5–15)
BUN: 12 mg/dL (ref 6–20)
BUN: 12 mg/dL (ref 6–20)
CO2: 21 mmol/L — ABNORMAL LOW (ref 22–32)
CO2: 22 mmol/L (ref 22–32)
Calcium: 7.6 mg/dL — ABNORMAL LOW (ref 8.9–10.3)
Calcium: 7.5 mg/dL — ABNORMAL LOW (ref 8.9–10.3)
Chloride: 111 mmol/L (ref 98–111)
Chloride: 112 mmol/L — ABNORMAL HIGH (ref 98–111)
Creatinine, Ser: 1.49 mg/dL — ABNORMAL HIGH (ref 0.44–1.00)
Creatinine, Ser: 1.65 mg/dL — ABNORMAL HIGH (ref 0.44–1.00)
GFR, Estimated: 40 mL/min — ABNORMAL LOW (ref >60)
GFR, Estimated: 35 mL/min — ABNORMAL LOW (ref >60)
Glucose, Bld: 117 mg/dL — ABNORMAL HIGH (ref 70–99)
Glucose, Bld: 145 mg/dL — ABNORMAL HIGH (ref 70–99)
Potassium: 4.9 mmol/L (ref 3.5–5.1)
Potassium: 5.2 mmol/L — ABNORMAL HIGH (ref 3.5–5.1)
Sodium: 139 mmol/L (ref 135–145)
Sodium: 138 mmol/L (ref 135–145)

## 2021-06-09 LAB — CBC
HCT: 24.5 % — ABNORMAL LOW (ref 36.0–46.0)
HCT: 25.1 % — ABNORMAL LOW (ref 36.0–46.0)
HCT: 25.8 % — ABNORMAL LOW (ref 36.0–46.0)
Hemoglobin: 8 g/dL — ABNORMAL LOW (ref 12.0–15.0)
Hemoglobin: 8.3 g/dL — ABNORMAL LOW (ref 12.0–15.0)
Hemoglobin: 8.7 g/dL — ABNORMAL LOW (ref 12.0–15.0)
MCH: 29.2 pg (ref 26.0–34.0)
MCH: 29.5 pg (ref 26.0–34.0)
MCH: 29.7 pg (ref 26.0–34.0)
MCHC: 32.7 g/dL (ref 30.0–36.0)
MCHC: 33.1 g/dL (ref 30.0–36.0)
MCHC: 33.7 g/dL (ref 30.0–36.0)
MCV: 87.5 fL (ref 80.0–100.0)
MCV: 88.4 fL (ref 80.0–100.0)
MCV: 91.1 fL (ref 80.0–100.0)
Platelets: 53 10*3/uL — ABNORMAL LOW (ref 150–400)
Platelets: 71 10*3/uL — ABNORMAL LOW (ref 150–400)
Platelets: 72 10*3/uL — ABNORMAL LOW (ref 150–400)
RBC: 2.69 MIL/uL — ABNORMAL LOW (ref 3.87–5.11)
RBC: 2.84 MIL/uL — ABNORMAL LOW (ref 3.87–5.11)
RBC: 2.95 MIL/uL — ABNORMAL LOW (ref 3.87–5.11)
RDW: 16.8 % — ABNORMAL HIGH (ref 11.5–15.5)
RDW: 16.9 % — ABNORMAL HIGH (ref 11.5–15.5)
RDW: 17.3 % — ABNORMAL HIGH (ref 11.5–15.5)
WBC: 6.7 10*3/uL (ref 4.0–10.5)
WBC: 6.9 10*3/uL (ref 4.0–10.5)
WBC: 7.7 10*3/uL (ref 4.0–10.5)
nRBC: 1.2 % — ABNORMAL HIGH (ref 0.0–0.2)
nRBC: 1.4 % — ABNORMAL HIGH (ref 0.0–0.2)
nRBC: 1.9 % — ABNORMAL HIGH (ref 0.0–0.2)

## 2021-06-09 LAB — POCT I-STAT 7, (LYTES, BLD GAS, ICA,H+H)
Acid-base deficit: 1 mmol/L (ref 0.0–2.0)
Acid-base deficit: 1 mmol/L (ref 0.0–2.0)
Acid-base deficit: 1 mmol/L (ref 0.0–2.0)
Acid-base deficit: 2 mmol/L (ref 0.0–2.0)
Acid-base deficit: 2 mmol/L (ref 0.0–2.0)
Bicarbonate: 23 mmol/L (ref 20.0–28.0)
Bicarbonate: 23 mmol/L (ref 20.0–28.0)
Bicarbonate: 23.2 mmol/L (ref 20.0–28.0)
Bicarbonate: 23.8 mmol/L (ref 20.0–28.0)
Bicarbonate: 24.2 mmol/L (ref 20.0–28.0)
Calcium, Ion: 1.11 mmol/L — ABNORMAL LOW (ref 1.15–1.40)
Calcium, Ion: 1.15 mmol/L (ref 1.15–1.40)
Calcium, Ion: 1.18 mmol/L (ref 1.15–1.40)
Calcium, Ion: 1.2 mmol/L (ref 1.15–1.40)
Calcium, Ion: 1.22 mmol/L (ref 1.15–1.40)
HCT: 23 % — ABNORMAL LOW (ref 36.0–46.0)
HCT: 24 % — ABNORMAL LOW (ref 36.0–46.0)
HCT: 24 % — ABNORMAL LOW (ref 36.0–46.0)
HCT: 24 % — ABNORMAL LOW (ref 36.0–46.0)
HCT: 24 % — ABNORMAL LOW (ref 36.0–46.0)
Hemoglobin: 7.8 g/dL — ABNORMAL LOW (ref 12.0–15.0)
Hemoglobin: 8.2 g/dL — ABNORMAL LOW (ref 12.0–15.0)
Hemoglobin: 8.2 g/dL — ABNORMAL LOW (ref 12.0–15.0)
Hemoglobin: 8.2 g/dL — ABNORMAL LOW (ref 12.0–15.0)
Hemoglobin: 8.2 g/dL — ABNORMAL LOW (ref 12.0–15.0)
O2 Saturation: 100 %
O2 Saturation: 100 %
O2 Saturation: 100 %
O2 Saturation: 100 %
O2 Saturation: 100 %
Patient temperature: 36.4
Patient temperature: 36.4
Patient temperature: 36.4
Patient temperature: 36.5
Patient temperature: 36.6
Potassium: 4.8 mmol/L (ref 3.5–5.1)
Potassium: 4.8 mmol/L (ref 3.5–5.1)
Potassium: 5 mmol/L (ref 3.5–5.1)
Potassium: 5.1 mmol/L (ref 3.5–5.1)
Potassium: 5.1 mmol/L (ref 3.5–5.1)
Sodium: 139 mmol/L (ref 135–145)
Sodium: 141 mmol/L (ref 135–145)
Sodium: 142 mmol/L (ref 135–145)
Sodium: 142 mmol/L (ref 135–145)
Sodium: 143 mmol/L (ref 135–145)
TCO2: 24 mmol/L (ref 22–32)
TCO2: 24 mmol/L (ref 22–32)
TCO2: 24 mmol/L (ref 22–32)
TCO2: 25 mmol/L (ref 22–32)
TCO2: 25 mmol/L (ref 22–32)
pCO2 arterial: 31.7 mmHg — ABNORMAL LOW (ref 32.0–48.0)
pCO2 arterial: 34 mmHg (ref 32.0–48.0)
pCO2 arterial: 36.1 mmHg (ref 32.0–48.0)
pCO2 arterial: 42 mmHg (ref 32.0–48.0)
pCO2 arterial: 43 mmHg (ref 32.0–48.0)
pH, Arterial: 7.355 (ref 7.350–7.450)
pH, Arterial: 7.358 (ref 7.350–7.450)
pH, Arterial: 7.409 (ref 7.350–7.450)
pH, Arterial: 7.439 (ref 7.350–7.450)
pH, Arterial: 7.466 — ABNORMAL HIGH (ref 7.350–7.450)
pO2, Arterial: 331 mmHg — ABNORMAL HIGH (ref 83.0–108.0)
pO2, Arterial: 367 mmHg — ABNORMAL HIGH (ref 83.0–108.0)
pO2, Arterial: 394 mmHg — ABNORMAL HIGH (ref 83.0–108.0)
pO2, Arterial: 397 mmHg — ABNORMAL HIGH (ref 83.0–108.0)
pO2, Arterial: 428 mmHg — ABNORMAL HIGH (ref 83.0–108.0)

## 2021-06-09 LAB — COOXEMETRY PANEL
Carboxyhemoglobin: 1.3 % (ref 0.5–1.5)
Methemoglobin: 0.9 % (ref 0.0–1.5)
O2 Saturation: 77.2 %
Total hemoglobin: 8.8 g/dL — ABNORMAL LOW (ref 12.0–16.0)

## 2021-06-09 LAB — HEPARIN LEVEL (UNFRACTIONATED)
Heparin Unfractionated: <0.1 IU/mL — ABNORMAL LOW (ref 0.30–0.70)
Heparin Unfractionated: 0.12 IU/mL — ABNORMAL LOW (ref 0.30–0.70)

## 2021-06-09 LAB — HEPATIC FUNCTION PANEL
ALT: 9 U/L (ref 0–44)
AST: 56 U/L — ABNORMAL HIGH (ref 15–41)
Albumin: 2.4 g/dL — ABNORMAL LOW (ref 3.5–5.0)
Alkaline Phosphatase: 39 U/L (ref 38–126)
Bilirubin, Direct: 0.1 mg/dL (ref 0.0–0.2)
Indirect Bilirubin: 0.6 mg/dL (ref 0.3–0.9)
Total Bilirubin: 0.7 mg/dL (ref 0.3–1.2)
Total Protein: 4 g/dL — ABNORMAL LOW (ref 6.5–8.1)

## 2021-06-09 LAB — PROTIME-INR
INR: 1.2 (ref 0.8–1.2)
Prothrombin Time: 15.5 seconds — ABNORMAL HIGH (ref 11.4–15.2)

## 2021-06-09 LAB — PREPARE PLATELET PHERESIS
Unit division: 0
Unit division: 0

## 2021-06-09 LAB — GLUCOSE, CAPILLARY
Glucose-Capillary: 103 mg/dL — ABNORMAL HIGH (ref 70–99)
Glucose-Capillary: 106 mg/dL — ABNORMAL HIGH (ref 70–99)
Glucose-Capillary: 108 mg/dL — ABNORMAL HIGH (ref 70–99)
Glucose-Capillary: 110 mg/dL — ABNORMAL HIGH (ref 70–99)
Glucose-Capillary: 114 mg/dL — ABNORMAL HIGH (ref 70–99)
Glucose-Capillary: 121 mg/dL — ABNORMAL HIGH (ref 70–99)
Glucose-Capillary: 122 mg/dL — ABNORMAL HIGH (ref 70–99)
Glucose-Capillary: 124 mg/dL — ABNORMAL HIGH (ref 70–99)
Glucose-Capillary: 127 mg/dL — ABNORMAL HIGH (ref 70–99)
Glucose-Capillary: 143 mg/dL — ABNORMAL HIGH (ref 70–99)

## 2021-06-09 LAB — APTT: aPTT: 102 seconds — ABNORMAL HIGH (ref 24–36)

## 2021-06-09 LAB — FIBRINOGEN: Fibrinogen: 495 mg/dL — ABNORMAL HIGH (ref 210–475)

## 2021-06-09 LAB — LACTATE DEHYDROGENASE: LDH: 450 U/L — ABNORMAL HIGH (ref 98–192)

## 2021-06-09 MED ORDER — INSULIN ASPART 100 UNIT/ML IJ SOLN
1.0000 [IU] | INTRAMUSCULAR | Status: DC
  Administered 2021-06-09 – 2021-06-11 (×6): 1 [IU] via SUBCUTANEOUS
  Administered 2021-06-12: 2 [IU] via SUBCUTANEOUS
  Administered 2021-06-12: 1 [IU] via SUBCUTANEOUS
  Administered 2021-06-12 (×2): 2 [IU] via SUBCUTANEOUS
  Administered 2021-06-12 – 2021-06-13 (×4): 1 [IU] via SUBCUTANEOUS
  Administered 2021-06-13: 2 [IU] via SUBCUTANEOUS
  Administered 2021-06-13 – 2021-06-14 (×4): 1 [IU] via SUBCUTANEOUS
  Administered 2021-06-14: 3 [IU] via SUBCUTANEOUS
  Administered 2021-06-14: 2 [IU] via SUBCUTANEOUS

## 2021-06-09 MED ORDER — FUROSEMIDE 10 MG/ML IJ SOLN
20.0000 mg | Freq: Once | INTRAMUSCULAR | Status: AC
  Administered 2021-06-09: 20 mg via INTRAVENOUS
  Filled 2021-06-09: qty 2

## 2021-06-09 MED ORDER — INSULIN DETEMIR 100 UNIT/ML ~~LOC~~ SOLN
5.0000 [IU] | Freq: Two times a day (BID) | SUBCUTANEOUS | Status: DC
  Administered 2021-06-09 – 2021-06-11 (×6): 5 [IU] via SUBCUTANEOUS
  Filled 2021-06-09 (×9): qty 0.05

## 2021-06-09 MED ORDER — IPRATROPIUM-ALBUTEROL 0.5-2.5 (3) MG/3ML IN SOLN
RESPIRATORY_TRACT | Status: AC
  Administered 2021-06-09: 3 mL
  Filled 2021-06-09: qty 3

## 2021-06-09 MED ORDER — ALBUTEROL SULFATE (2.5 MG/3ML) 0.083% IN NEBU: 2.5000 mg | INHALATION_SOLUTION | RESPIRATORY_TRACT | Status: DC

## 2021-06-09 MED ORDER — SODIUM CHLORIDE 0.9 % IV SOLN
20.0000 ug | Freq: Once | INTRAVENOUS | Status: AC
  Administered 2021-06-09: 20 ug via INTRAVENOUS
  Filled 2021-06-09: qty 2

## 2021-06-09 MED ORDER — IPRATROPIUM-ALBUTEROL 0.5-2.5 (3) MG/3ML IN SOLN: 3.0000 mL | RESPIRATORY_TRACT | Status: DC | PRN

## 2021-06-09 NOTE — Progress Notes (Signed)
25 mL of versed wasted in stericycle. Witnessed by Candyce Churn, RN.

## 2021-06-09 NOTE — CV Procedure (Signed)
    TRANSESOPHAGEAL ECHOCARDIOGRAM   NAME:  [name] [name]   MRN: [id number] DOB:  Jan 27, 1961   ADMIT DATE: 06/11/2021  INDICATIONS:  Cardiogenic shock  PROCEDURE:   Informed consent was obtained prior to the procedure. The risks, benefits and alternatives for the procedure were discussed and the patient comprehended these risks.  Risks include, but are not limited to, cough, sore throat, vomiting, nausea, somnolence, esophageal and stomach trauma or perforation, bleeding, low blood pressure, aspiration, pneumonia, infection, trauma to the teeth and death.    Patient already sedated on vent    COMPLICATIONS:    There were no immediate complications.  FINDINGS:  LEFT VENTRICLE: EF = 30-35%. Severe concentric LVH with small LV cavity. Impella tip at 3.7   RIGHT VENTRICLE: Severely HK  LEFT ATRIUM: markedly dilated  LEFT ATRIAL APPENDAGE: Not visualized  RIGHT ATRIUM: + venous ECMO cannula  AORTIC VALVE:  Trileaflet.    MITRAL VALVE:    Normal.Trivial MR  TRICUSPID VALVE: Normal. Trivial TR  PULMONIC VALVE: Grossly normal.  INTERATRIAL SEPTUM: No PFO or ASD.  PERICARDIUM: Moderate effusion   DESCENDING AORTA: Moderate plaque. + Impella   CONCLUSION:   Humzah Harty,MD 11:29 AM

## 2021-06-09 NOTE — Progress Notes (Signed)
[place] for IV Heparin Indication:  ECMO and Impella  No Known Allergies  Patient Measurements: Height: 5\' 3"  (160 cm) Weight: 97.3 kg (214 lb 8.1 oz) IBW/kg (Calculated) : 52.4 Heparin Dosing Weight: 72 kg  Vital Signs: Temp: 97.52 F (36.4 C) (06/19 0815) Temp Source: Core (06/19 0800) BP: 80/69 (06/19 0730) Pulse Rate: 80 (06/19 0815)  Labs: Recent Labs    05/31/2021 0232 05/29/2021 0237 06/03/2021 0919 06/16/2021 1613 06/08/21 0302 06/08/21 0304 06/08/21 1613 06/08/21 1614 06/09/21 0017 06/09/21 0019 06/09/21 0220 06/09/21 0420 06/09/21 0611  HGB 7.5*   < > 8.3*   < > 8.2*   < > 10.1*   < > 8.7*   < > 8.2* 8.3* 7.8*  HCT 21.9*   < > 24.2*   < > 24.7*   < > 29.8*   < > 25.8*   < > 24.0* 25.1* 23.0*  PLT 50*  --  44*   < > 57*  --  38*  --  72*  --   --  71*  --   APTT  --   --  189*  --  173*  --   --   --   --   --   --  102*  --   LABPROT 18.5*  --   --   --  16.7*  --   --   --   --   --   --  15.5*  --   INR 1.5*  --   --   --  1.4*  --   --   --   --   --   --  1.2  --   HEPARINUNFRC  --   --  0.16*   < > 0.21*  --  0.12*  --   --   --   --  <0.10*  --   CREATININE 1.93*  --   --    < > 1.59*  --  1.51*  --   --   --   --  1.49*  --    < > = values in this interval not displayed.     Estimated Creatinine Clearance: 44.6 mL/min (A) (by C-G formula based on SCr of 1.49 mg/dL (H)).   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 Cristopher Peru)   Thyroid disease    PROCEDURE FOR THYROID 15 YRS AGO AT [facility]   WPW (Wolff-Parkinson-White syndrome)     Medications:  Infusions:   sodium chloride 10 mL/hr at 06/09/21 0800   sodium chloride     sodium chloride     sodium chloride     sodium  chloride     sodium chloride 10 mL/hr at 06/09/21 0800   sodium chloride     albumin human Stopped (06/09/21 0125)   amiodarone 30 mg/hr (06/09/21 0800)    ceFAZolin (ANCEF) IV Stopped (06/09/21 0646)   dexmedetomidine (PRECEDEX) IV infusion 0.7 mcg/kg/hr (06/09/21 0800)   epinephrine 7 mcg/min (06/09/21 0800)   feeding supplement (PIVOT 1.5 CAL) Stopped (06/08/21 1845)   heparin 25 units/mL (Impella PURGE) in dextrose 5 % 1000 mL bag     heparin 200 Units/hr (06/09/21  0800)   HYDROmorphone 8 mg/hr (06/09/21 0800)   insulin 1.5 Units/hr (06/09/21 0800)   lactated ringers     lactated ringers 10 mL/hr at 06/09/21 0800   lactated ringers     midazolam 4 mg/hr (06/09/21 0800)   milrinone 0.25 mcg/kg/min (06/09/21 0800)   norepinephrine (LEVOPHED) Adult infusion 4 mcg/min (06/09/21 0800)    Assessment:  76 yoF admitted with cardiogenic shock 2/2 LM SCAD. Pt s/p CABG with VA-ECMO and Impella placement. Heparin started systemically and via purge (50 units/ml). Heparin levels elevated on purge alone so concentration reduced to half-strength (25 units/ml). TEG 6/17 demonstrated prolonged R-time with heparin level ~0.16 even with heparinase correlating with underlying hypocoagulable state - thus will target heparin level closer to 0.2.  Heparin paused briefly yesterday with bleeding at cannula site - oozing is ongoing. Heparin level undetectable, aPTT remains high. Pltc improved after platelet transfusion, H/H stable. Pt continues to have bleeding so will NOT titrate heparin for now per ECMO team.   Goal of Therapy:  Heparin level 0.2-0.5 - keep on low end for now  Monitor platelets by anticoagulation protocol: Yes   Plan:  -Continue half-strength heparinized purge (25 units/ml) -Continue systemic heparin at 200 units/h -Check heparin level and CBC q12h   [name], PharmD, BCPS, [facility] Clinical Pharmacist [phone] Please check AMION for all [facility] numbers 06/09/2021

## 2021-06-09 NOTE — Progress Notes (Signed)
RT NOTE: Will hold on SBT this AM due to patient not breathing over ventilator and plans for patient to go back to OR tomorrow.  Tolerating current ventilator settings well at this time.  Will continue to monitor.

## 2021-06-09 NOTE — Progress Notes (Signed)
ECMO NOTE:   Indication: Cardiogenic shock    Initial cannulation date: 05/29/2021   ECMO type: VA ECMO   Cannulation:   Central cannulation Asc Ao & RA   ECMO events:   - Initial cannulation 05/24/2021   Daily data:   Flow 3.6L RPM 2850 Sweep  3.5 L   Labs:   ABG    Component Value Date/Time   PHART 7.409 06/09/2021 0611   PCO2ART 36.1 06/09/2021 0611   PO2ART 397 (H) 06/09/2021 0611   HCO3 23.0 06/09/2021 0611   TCO2 24 06/09/2021 0611   ACIDBASEDEF 2.0 06/09/2021 0611   O2SAT 100.0 06/09/2021 0611    Hgb 8.3 Platelets 50 -> 58 -> 71K LDH  1876 -> 980 -> 580  -> 450 Heparin level: < 0.10    Plan:  - Continue VA ECMO with Impella vent at P-1 (waveforms ok) - Bronch today to reassess RUL for mucous plug (vs hemothorax) - Running heparin low due to oozing. - Transfuse to keep Hgb >8.0 - Continue support over the w/e. ? Wean tomorrow - Wean to Impella  Glori Bickers, MD  9:28 AM

## 2021-06-09 NOTE — Progress Notes (Signed)
[address]       [place] [address]             [phone]        CARDIOTHORACIC SURGERY PROGRESS NOTE   R2 Days Post-Op Procedure(s) (LRB): Reexploration and evacuation of hematoma (N/A)  Subjective: Sedated on vent.  Relatively stable and no clinical events overnight.  New RUL collapse on CXR this morning c/w likely mucous plugging  Objective: Vital signs: BP Readings from Last 1 Encounters:  06/09/21 (!) 80/69   Pulse Readings from Last 1 Encounters:  06/09/21 78   Resp Readings from Last 1 Encounters:  06/09/21 15   Temp Readings from Last 1 Encounters:  06/09/21 (!) 97.52 F (36.4 C)    Hemodynamics: PAP: (15-35)/(9-23) 23/11 CVP:  [7 mmHg-14 mmHg] 8 mmHg ECMO flows 3.6 L/min on 2800 rpm Impella vent flows 1.2 L/min on P1  Improved pulsatility on Aline waveform  Physical Exam:  Rhythm:   sinus  Breath sounds: clear  Heart sounds:  distant  Incisions:  Escmarch intact, flat  Abdomen:  Soft, non-distended, quiet  Extremities:  Warm, well-perfused  Chest tubes:  low volume thin serosanguinous output, no air leak   Intake/Output from previous day: 06/18 0701 - 06/19 0700 In: 5163.6 [I.V.:2514.6; Blood:686; NG/GT:425; IV Piggyback:1147.6] Out: 8250 [NLZJQ:7341; Emesis/NG output:850; Chest Tube:720] Intake/Output this shift: Total I/O In: 225.2 [I.V.:192.4; Other:32.8] Out: 195 [Urine:175; Chest Tube:20]  Lab Results:  CBC: Recent Labs    06/09/21 0017 06/09/21 0019 06/09/21 0420 06/09/21 0611  WBC 6.7  --  6.9  --   HGB 8.7*   < > 8.3* 7.8*  HCT 25.8*   < > 25.1* 23.0*  PLT 72*  --  71*  --    < > = values in this interval not displayed.    BMET:  Recent Labs    06/08/21 1613 06/08/21 1614 06/09/21 0420 06/09/21 0611  NA 138   < > 139 142  K 5.0   < > 4.9 5.1  CL 114*  --  111  --   CO2 22  --  21*  --   GLUCOSE 152*  --  117*  --   BUN 13  --  12  --   CREATININE 1.51*  --  1.49*  --   CALCIUM 7.4*  --   7.6*  --    < > = values in this interval not displayed.     PT/INR:   Recent Labs    06/09/21 0420  LABPROT 15.5*  INR 1.2    CBG (last 3)  Recent Labs    06/09/21 0419 06/09/21 0609 06/09/21 0711  GLUCAP 106* 121* 122*    ABG    Component Value Date/Time   PHART 7.409 06/09/2021 0611   PCO2ART 36.1 06/09/2021 0611   PO2ART 397 (H) 06/09/2021 0611   HCO3 23.0 06/09/2021 0611   TCO2 24 06/09/2021 0611   ACIDBASEDEF 2.0 06/09/2021 0611   O2SAT 100.0 06/09/2021 0611    CXR: PORTABLE CHEST 1 VIEW   COMPARISON:  Chest radiograph June 08, 2021.   FINDINGS: PA catheter stable in position. ET tube mid trachea. Enteric tube courses inferior to the diaphragm. Stable positioning of large bore central venous catheters for ECMO. Impella device redemonstrated. Left chest tube redemonstrated. Stable cardiac and mediastinal contours. Interval consolidation of the right upper lobe most compatible with atelectasis. Similar left basilar heterogeneous opacities. No definite pleural effusion.  IMPRESSION: Interval consolidation right upper lobe most compatible with atelectasis. Similar patchy opacities left lung base favored to represent atelectasis.   Similar support lines and tubes.     Electronically Signed   By: Lovey Newcomer M.D.   On: 06/09/2021 08:13    Assessment/Plan: S/P Procedure(s) (LRB): Reexploration and evacuation of hematoma (N/A)  Clinically stable on ECMO support Maintaining NSR w/ stable ECMO and Impella flows, improved pulsatility in Aline suggestive of LV recovery O2 sats 100% pm 30% FiO2, CXR w/ new RUL collapse c/w mucous plugging Expected post op acute blood loss anemia, Hgb stab;e 8.3 Post op thrombocytopenia w/ h/o Von Willibrand's disease, platelet count up slightly 71k Post op elevated serum creatinine, likely due to prerenal azotemia +/- acute kidney injury caused by ATN, UOP adequate and creatinine trending down Elevated liver enzymes  likely due to shock, further decreased and close to normal Neuro status has appeared grossly intact   Agree w/ need for bronchoscopy Repeat TEE later today Consider ECMO wean soon    Rexene Alberts, MD 06/09/2021 9:36 AM

## 2021-06-09 NOTE — Progress Notes (Signed)
  Echocardiogram Echocardiogram Transesophageal has been performed.  [name] [name] 06/09/2021, 11:28 AM

## 2021-06-09 NOTE — Progress Notes (Signed)
[place] for IV Heparin Indication:  ECMO and Impella  No Known Allergies  Patient Measurements: Height: 5\' 3"  (160 cm) Weight: 97.3 kg (214 lb 8.1 oz) IBW/kg (Calculated) : 52.4 Heparin Dosing Weight: 72 kg  Vital Signs: Temp: 97.7 F (36.5 C) (06/19 1645) Temp Source: Core (06/19 1600) BP: 80/69 (06/19 0730) Pulse Rate: 90 (06/19 1645)  Labs: Recent Labs    06/01/2021 0232 06/02/2021 0237 06/11/2021 0919 05/22/2021 1613 06/08/21 0302 06/08/21 0304 06/08/21 1613 06/08/21 1614 06/09/21 0017 06/09/21 0019 06/09/21 0420 06/09/21 0611 06/09/21 1128 06/09/21 1607  HGB 7.5*   < > 8.3*   < > 8.2*   < > 10.1*   < > 8.7*   < > 8.3* 7.8* 8.2* 8.0*  HCT 21.9*   < > 24.2*   < > 24.7*   < > 29.8*   < > 25.8*   < > 25.1* 23.0* 24.0* 24.5*  PLT 50*  --  44*   < > 57*  --  38*  --  72*  --  71*  --   --  53*  APTT  --   --  189*  --  173*  --   --   --   --   --  102*  --   --   --   LABPROT 18.5*  --   --   --  16.7*  --   --   --   --   --  15.5*  --   --   --   INR 1.5*  --   --   --  1.4*  --   --   --   --   --  1.2  --   --   --   HEPARINUNFRC  --   --  0.16*   < > 0.21*  --  0.12*  --   --   --  <0.10*  --   --  0.12*  CREATININE 1.93*  --   --    < > 1.59*  --  1.51*  --   --   --  1.49*  --   --   --    < > = values in this interval not displayed.     Estimated Creatinine Clearance: 44.6 mL/min (A) (by C-G formula based on SCr of 1.49 mg/dL (H)).   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 Cristopher Peru)   Thyroid disease    PROCEDURE FOR THYROID 15 YRS AGO AT [facility]   WPW (Wolff-Parkinson-White syndrome)     Medications:  Infusions:   sodium chloride 10 mL/hr at 06/09/21 1600   sodium chloride      sodium chloride     sodium chloride     sodium chloride     sodium chloride 10 mL/hr at 06/09/21 1600   sodium chloride     albumin human Stopped (06/09/21 0125)   amiodarone 30 mg/hr (06/09/21 1600)    ceFAZolin (ANCEF) IV Stopped (06/09/21 1337)   dexmedetomidine (PRECEDEX) IV infusion 0.3 mcg/kg/hr (06/09/21 1600)   epinephrine 7 mcg/min (06/09/21 1600)   feeding supplement (PIVOT 1.5 CAL) Stopped (06/08/21 1845)   heparin 25  units/mL (Impella PURGE) in dextrose 5 % 1000 mL bag     heparin 200 Units/hr (06/09/21 1600)   HYDROmorphone 8 mg/hr (06/09/21 1600)   insulin Stopped (06/09/21 1330)   lactated ringers     lactated ringers 10 mL/hr at 06/09/21 1600   lactated ringers     midazolam 6 mg/hr (06/09/21 1620)   milrinone 0.25 mcg/kg/min (06/09/21 1600)   norepinephrine (LEVOPHED) Adult infusion 9 mcg/min (06/09/21 1600)    Assessment:  34 yoF admitted with cardiogenic shock 2/2 LM SCAD. Pt s/p CABG with VA-ECMO and Impella placement. Heparin started systemically and via purge (50 units/ml). Heparin levels elevated on purge alone so concentration reduced to half-strength (25 units/ml). TEG 6/17 demonstrated prolonged R-time with heparin level ~0.16 even with heparinase correlating with underlying hypocoagulable state - thus will target heparin level closer to 0.2.  Ongoing bleeding at cannula site. Heparin level low at 0.12 now detectable, aPTT remains high. Pltc improved after platelet transfusion but now back down to 53, H/H stable. Pt continues to have bleeding so will NOT titrate heparin for now per ECMO team.   Goal of Therapy:  Heparin level 0.2-0.5 - keep on low end for now  Monitor platelets by anticoagulation protocol: Yes   Plan:  -Continue half-strength heparinized purge (25 units/ml) -Continue systemic heparin at 200 units/h -Check heparin level and CBC q12h   Erin Hearing PharmD., BCPS Clinical Pharmacist 06/09/2021 5:00 PM

## 2021-06-09 NOTE — Progress Notes (Addendum)
ECMO PROGRESS NOTE  NAME:  [name] [name], MRN:  [id number], DOB:  05-Nov-1961, LOS: 4 ADMISSION DATE:  05/28/2021, CONSULTATION DATE: 06/11/2021 REFERRING MD: Roxan Hockey, CHIEF COMPLAINT: VA ECMO  HPI/course in hospital   60 year old woman with hx of HTN, HLD, hypothyroidism, CVA, DM2 who presented with angina.  Underwent cardiac cath which showed left main dissection, during this procedure became acutely unstable with crushing chest pain and shock.  Taken emergently to OR for bypass after 3-5 impella placed.  Tough to wean from bypass so left centrally cannulated on VA ECMO and sent to [facility].  PCCM consulted to help with management.  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 Cristopher Peru)   Thyroid disease    PROCEDURE FOR THYROID 15 YRS AGO AT [facility]   WPW (Wolff-Parkinson-White syndrome)      Past Surgical History:  Procedure Laterality Date   BREAST SURGERY     BX RT BREAST  BENIGN   CORONARY ARTERY BYPASS GRAFT N/A 06/14/2021   Procedure: CORONARY ARTERY BYPASS GRAFTING (CABG)X2.USING  LEFT INTERNAL MAMMARY ARTERY AND  RIGHT ENDOSCOPIC SAPHENOUS VEIN HARVESTING. ECMO INSERTION;  Surgeon: Melrose Nakayama, MD;  Location: [place];  Service: Open Heart Surgery;  Laterality: N/A;   CRANIOTOMY N/A 01/04/2016   Procedure: Suboccipital Craniotomy and Cervical one Laminectomy for Clipping of Aneurysm;  Surgeon: Kevan Ny Ditty, MD;  Location: [place] [facility];  Service: Neurosurgery;  Laterality: N/A;   CRANIOTOMY N/A 07/09/2016   Procedure: Craniotomy for clipping of left middle cerebral artery aneurysm;  Surgeon: Kevan Ny Ditty, MD;  Location: [place] [facility] [facility];  Service: Neurosurgery;  Laterality: N/A;  Craniotomy for clipping of left middle cerebral  artery aneurysm   CRANIOTOMY Right 10/24/2016   Procedure: 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 Christella Noa;  Surgeon: Kevan Ny Ditty, MD;  Location: [place];  Service: Neurosurgery;  Laterality: Right;   ELECTROPHYSIOLOGIC STUDY     LAPAROSCOPIC REVISION VENTRICULAR-PERITONEAL (V-P) SHUNT N/A 02/04/2016   Procedure: LAPAROSCOPIC Insertion VENTRICULAR-PERITONEAL (V-P) SHUNT;  Surgeon: Rolm Bookbinder, MD;  Location: [facility];  Service: General;  Laterality: N/A;   LEFT HEART CATH AND CORONARY ANGIOGRAPHY N/A 06/08/2021   Procedure: LEFT HEART CATH AND CORONARY ANGIOGRAPHY;  Surgeon: Jettie Booze, MD;  Location: [facility];  Service: Cardiovascular;  Laterality: N/A;   RADIOLOGY WITH ANESTHESIA N/A 01/03/2016   Procedure: RADIOLOGY WITH ANESTHESIA;  Surgeon: Medication Radiologist, MD;  Location: [facility];  Service: Radiology;  Laterality: N/A;   TEE WITHOUT CARDIOVERSION N/A 06/15/2021   Procedure: TRANSESOPHAGEAL ECHOCARDIOGRAM (TEE);  Surgeon: Melrose Nakayama, MD;  Location: [facility];  Service: Open Heart Surgery;  Laterality: N/A;   VENTRICULAR ASSIST DEVICE INSERTION N/A 05/29/2021   Procedure: VENTRICULAR ASSIST DEVICE INSERTION;  Surgeon: Jettie Booze, MD;  Location: [facility];  Service: Cardiovascular;  Laterality: N/A;   VENTRICULOPERITONEAL SHUNT Right 02/04/2016   Procedure: SHUNT INSERTION VENTRICULAR-PERITONEAL With Laparoscopic Assistance;  Surgeon: Kevan Ny Ditty, MD;  Location: [place] [facility];  Service: Neurosurgery;  Laterality: Right;     Significant Hospital events:  6/14 admitted 6/15 spontaneous artery dissection left main on cath.CP Impella  placed.   6/15 emergent coronary artery bypass graft, LIMA to LAD, SVG to OM.  Brief cardiac arrest of less than 10 minutes immediately prior to initiation of cardiopulmonary bypass. 6/15 unable to wean.  Still unable to wean from bypass so placed on centrally  cannulated VA ECMO. 6/16 EEG shows encephalopathy.  6/16 Cold right hand - seen by vascular, conservative management 6/17 Hand improved. 72m clot removed with washout at the bedside   Interim history/subjective:   Bleeding yesterday following patient waking episode, required clean out. Continued oozing, heparin on hold. Received blood and PLT yesterday.  Not tolerating feeds.   Objective   Blood pressure (!) 80/69, pulse 87, temperature (!) 97.52 F (36.4 C), resp. rate 15, height _0  (1.6 m), weight 97.3 kg, SpO2 100 %. PAP: (15-32)/(9-19) 25/13 CVP:  [7 mmHg-13 mmHg] 8 mmHg  Vent Mode: PRVC FiO2 (%):  [30 %] 30 % Set Rate:  [15 bmp] 15 bmp Vt Set:  [310 mL] 310 mL PEEP:  [5 cmH20-10 cmH20] 10 cmH20 Plateau Pressure:  [22 cmH20-23 cmH20] 23 cmH20   Intake/Output Summary (Last 24 hours) at 06/09/2021 1430 Last data filed at 06/09/2021 1400 Gross per 24 hour  Intake 4066.56 ml  Output 4545 ml  Net -478.44 ml    Filed Weights   05/25/2021 0500 06/08/21 0500 06/09/21 0443  Weight: 93.1 kg 94.9 kg 97.3 kg    ECMO Device: Cardiohelp  ECMO Mode: VA  Flow (LPM): 3.53   Physical Exam Constitutional:      Appearance: She is obese.  HENT:     Head: Normocephalic.  Eyes:     Comments: sclerae intact Neck:     Comments: ET tube and OG tube in place Cardiovascular:     Rate and Rhythm: Normal rate and regular rhythm.     Heart sounds: Normal heart sounds.     Comments: Open chest with central ECMO cannulas, Dacron spacer is concave.  All extremities warm. Mild generalized edema. Pulmonary:     Breath sounds: Normal vesicular breath sounds with diffuse rhonchi    Comments: Mechanically ventilated with acceptable airway pressures.  Abdominal:     Palpations: Abdomen is soft.  Minimal OGT output.  Skin:    General: Skin is warm and dry.     Capillary Refill: Capillary refill takes 2 to 3 seconds.  Neurological:     Comments: Sedated.  No movement to voice or spontaneously,  RASS -4  Ancillary tests (personally reviewed)  CBC: Recent Labs  Lab 05/22/2021 1613 06/20/2021 1618 06/08/21 0302 06/08/21 0304 06/08/21 1613 06/08/21 1614 06/09/21 0017 06/09/21 0019 06/09/21 0220 06/09/21 0420 06/09/21 0611 06/09/21 1128  WBC 7.6  --  8.2  --  7.9  --  6.7  --   --  6.9  --   --   HGB 7.1*   < > 8.2*   < > 10.1*   < > 8.7* 8.2* 8.2* 8.3* 7.8* 8.2*  HCT 22.0*   < > 24.7*   < > 29.8*   < > 25.8* 24.0* 24.0* 25.1* 23.0* 24.0*  MCV 85.6  --  88.8  --  87.6  --  87.5  --   --  88.4  --   --   PLT 88*  --  57*  --  38*  --  72*  --   --  71*  --   --    < > = values in this interval not displayed.  Basic Metabolic Panel: Recent Labs  Lab 06/01/2021 0450 06/08/2021 1506 05/28/2021 2055 06/06/21 0012 06/06/21 0402 06/06/21 4193 06/06/21 1546 06/06/21 1557 06/15/2021 0232 05/27/2021 0237 05/30/2021 1613 06/08/2021 1618 06/08/21 0302 06/08/21 0304 06/08/21 1613 06/08/21 1614 06/09/21 0019 06/09/21 0220 06/09/21 0420 06/09/21 0611 06/09/21 1128  NA 139   < > 144  142   < > 139   < > 141   < > 142   < > 141   < > 139   < > 138   < > 142 143 139 142 141  K 3.8   < > 3.1*  2.9*   < > 3.4*   < > 3.7   < > 4.3   < > 4.6   < > 5.0   < > 5.0   < > 5.0 4.8 4.9 5.1 5.1  CL 110   < > 112*  --  112*  --  113*  --  114*  --  116*  --  115*  --  114*  --   --   --  111  --   --   CO2 23  --  17*  --  17*  --  20*  --  22  --  22  --  21*  --  22  --   --   --  21*  --   --   GLUCOSE 81   < > 207*  --  251*  --  178*  --  177*  --  143*  --  150*  --  152*  --   --   --  117*  --   --   BUN 16   < > 13  --  14  --  14  --  14  --  12  --  14  --  13  --   --   --  12  --   --   CREATININE 1.50*   < > 1.76*  --  1.91*  --  1.90*  --  1.93*  --  1.66*  --  1.59*  --  1.51*  --   --   --  1.49*  --   --   CALCIUM 8.9  --  7.4*  --  7.3*  --  7.4*  --  7.6*  --  7.5*  --  7.2*  --  7.4*  --   --   --  7.6*  --   --   MG 1.9  --  1.8  --  2.6*  --  2.0  --   --   --   --   --    --   --   --   --   --   --   --   --   --    < > = values in this interval not displayed.    GFR: Estimated Creatinine Clearance: 44.6 mL/min (A) (by C-G formula based on SCr of 1.49 mg/dL (H)). Recent Labs  Lab 06/19/2021 2055 06/06/21 0402 06/06/21 1810 05/26/2021 0232 05/30/2021 0919 06/08/21 0302 06/08/21 1613 06/09/21 0017 06/09/21 0420  WBC 11.3* 9.9   < > 7.8   < > 8.2 7.9 6.7 6.9  LATICACIDVEN 7.5* 5.1*  --  1.2  --   --   --   --   --    < > = values in this interval not displayed.     Liver  Function Tests: Recent Labs  Lab 06/06/2021 2055 06/06/21 0402 06/15/2021 0232 06/08/21 0302 06/09/21 0420  AST 889* 771* 212* 87* 56*  ALT 290* 221* 53* 16 9  ALKPHOS 25* 26* 22* 28* 39  BILITOT 0.7 0.7 0.7 1.1 0.7  PROT 3.6* 3.8* 4.2* 3.7* 4.0*  ALBUMIN 2.2* 2.6* 3.2* 2.4* 2.4*    No results for input(s): LIPASE, AMYLASE in the last 168 hours. No results for input(s): AMMONIA in the last 168 hours.  ABG    Component Value Date/Time   PHART 7.358 06/09/2021 1128   PCO2ART 42.0 06/09/2021 1128   PO2ART 331 (H) 06/09/2021 1128   HCO3 23.8 06/09/2021 1128   TCO2 25 06/09/2021 1128   ACIDBASEDEF 2.0 06/09/2021 1128   O2SAT 100.0 06/09/2021 1128      Coagulation Profile: Recent Labs  Lab 05/31/2021 2230 06/06/21 0402 06/06/2021 0232 06/08/21 0302 06/09/21 0420  INR 1.6* 1.7* 1.5* 1.4* 1.2     Cardiac Enzymes: No results for input(s): CKTOTAL, CKMB, CKMBINDEX, TROPONINI in the last 168 hours.  HbA1C: Hgb A1c MFr Bld  Date/Time Value Ref Range Status  06/11/2021 10:03 AM 5.4 4.8 - 5.6 % Final    Comment:    (NOTE)         Prediabetes: 5.7 - 6.4         Diabetes: >6.4         Glycemic control for adults with diabetes: <7.0     CBG: Recent Labs  Lab 06/09/21 0303 06/09/21 0419 06/09/21 0609 06/09/21 0711 06/09/21 0812  GLUCAP 110* 106* 121* 122* 103*   CXR: show RUL collapse (personal review)  Assessment & Plan:  Critically ill due to cardiogenic  shock secondary to spontaneous coronary dissection of the left main requiring VA ECMO support and titration of vasopressors and inotropes. CP Impella to offload LV. Status post two-vessel bypass Ongoing slow bleeding due to platelet dysfunction.  RUL collapse AKI due to hypotension - creatinine improving.  Shock liver due to hypotension - improving Multifactorial thrombocytopenia - improving Status post PEA cardiac arrest Uncontrolled type 2 diabetes  Plan:  -Titrate norepinephrine to keep MAP greater than 65 - stable NE requirement. Sill mildly vasoplegic following CPB run and ECMO.  -Continue epinephrine and milrinone at current doses. -Maintain current ECMO flow.  Observe for spontaneous recovery and return of pulsatility.  Plan to wean to Impella Monday pending TEE Sunday.  -Bronchoscopy to clear secretions. Open chest precludes chest PT. -Continue rest ventilator settings.  Acceptable gas exchange. -TEE today to assess LV recovery, RV function, Impella position. If favorable will plan to wean ECMO and transition to Impella alone.  -DDAVP for platelet dysfunction.   Daily Goals Checklist  Pain/Anxiety/Delirium protocol (if indicated): Dilaudid and Versed to keep RASS goal -4. Wean Precedex off - will help pressor requirements.  VAP protocol (if indicated): Bundle in place Respiratory support goals: Rest ventilator settings.  No weaning at this time. Blood pressure target: MAP greater than 65, normal cardiac output. DVT prophylaxis: On systemic heparin for VA ECMO Nutritional status and feeding goals: Post pyloric tube tomorrow. GI prophylaxis: Pantoprazole.  Fluid status goals: No diuresis today  Urinary catheter: Assessment of intravascular volume Central lines: Right IJ MAC introducer, left radial art line Glucose control: Uncontrolled type 2 diabetes on insulin infusion, transition to [place] insulin.  Mobility/therapy needs: Bedrest Antibiotic de-escalation: Perioperative  antibiotics Home medication reconciliation: On hold Daily labs: Per ECMO protocol Code Status: Full code Family Communication: Family to be updated  over the phone.  Disposition: ICU  CRITICAL CARE Performed by: Kipp Brood  Total critical care time: 45 minutes  Critical care time was exclusive of separately billable procedures and treating other patients.  Critical care was necessary to treat or prevent imminent or life-threatening deterioration.  Critical care was time spent personally by me on the following activities: development of treatment plan with patient and/or surrogate as well as nursing, discussions with consultants, evaluation of patient's response to treatment, examination of patient, obtaining history from patient or surrogate, ordering and performing treatments and interventions, ordering and review of laboratory studies, ordering and review of radiographic studies, pulse oximetry, re-evaluation of patient's condition and participation in multidisciplinary rounds.  Kipp Brood, MD [facility] ICU Physician [place]  Pager: [phone] Mobile: [phone] After hours: [phone].   06/09/2021, 2:30 PM

## 2021-06-09 NOTE — Progress Notes (Signed)
Advanced Heart Failure Rounding Note   Subjective:    6/15 - LM coronary scad with emergent CABG  -> ECMO 6/17 - chest washout at bedside  Remains on VA ECMO with Impella vent (see ECMO note)  On epi 7, NE 4, milrinone 0.25 and IV amio  MAPS 70-80s  Had some bleeding at ECMO cannulation site after she moved. Got several units of RBCs and PLTs yesterday. Remains oozy.   CX apparent RUL hemothorax (vs mucous plug collapse)  Remains intubated/sedated/ Now on versed, precedex and dilaudid.   Co-ox 77%  Creatinine 1.8 -> 1.9 -> 1.9 -> 1.6 -> 1.5 LDH 450  CTs draining about 10cc/hr  Heparin level  <0.10  PLTs up  57K. - > 71k  Hgb 8.3    Objective:   Weight Range:  Vital Signs:   Temp:  [97.16 F (36.2 C)-97.52 F (36.4 C)] 97.52 F (36.4 C) (06/19 0830) Pulse Rate:  [77-106] 80 (06/19 0830) Resp:  [15-16] 15 (06/19 0800) BP: (80-91)/(69-74) 80/69 (06/19 0730) SpO2:  [99 %-100 %] 100 % (06/19 0830) Arterial Line BP: (76-100)/(66-81) 79/68 (06/19 0830) FiO2 (%):  [30 %] 30 % (06/19 0800) Weight:  [97.3 kg] 97.3 kg (06/19 0443) Last BM Date: 06/03/21  Weight change: Filed Weights   05/31/2021 0500 06/08/21 0500 06/09/21 0443  Weight: 93.1 kg 94.9 kg 97.3 kg    Intake/Output:   Intake/Output Summary (Last 24 hours) at 06/09/2021 0843 Last data filed at 06/09/2021 0800 Gross per 24 hour  Intake 5144.18 ml  Output 3720 ml  Net 1424.18 ml      Physical Exam: General:  Intubated/sedated HEENT: normal +ETT  Neck:RIJ swan  Cor: Chest open with Esmarch. Mild oozing. + CTs and ECMO cannulas  Lungs: clear Abdomen: obese soft, nontender, nondistended. No hepatosplenomegaly. No bruits or masses. Good bowel sounds. Extremities: no cyanosis, clubbing, rash, 2+ edema Neuro: sedated on vent    Telemetry: NSR 80-90s Personally reviewed   Labs: Basic Metabolic Panel: Recent Labs  Lab 06/08/2021 0450 05/31/2021 1506 05/26/2021 2055 06/06/21 0012 06/06/21 0402  06/06/21 0605 06/06/21 1546 06/06/21 1557 06/06/2021 0232 06/03/2021 0237 06/16/2021 1613 05/25/2021 1618 06/08/21 0302 06/08/21 0304 06/08/21 1613 06/08/21 1614 06/08/21 2005 06/09/21 0019 06/09/21 0220 06/09/21 0420 06/09/21 0611  NA 139   < > 144  142   < > 139   < > 141   < > 142   < > 141   < > 139   < > 138   < > 143 142 143 139 142  K 3.8   < > 3.1*  2.9*   < > 3.4*   < > 3.7   < > 4.3   < > 4.6   < > 5.0   < > 5.0   < > 4.8 5.0 4.8 4.9 5.1  CL 110   < > 112*  --  112*  --  113*  --  114*  --  116*  --  115*  --  114*  --   --   --   --  111  --   CO2 23  --  17*  --  17*  --  20*  --  22  --  22  --  21*  --  22  --   --   --   --  21*  --   GLUCOSE 81   < > 207*  --  251*  --  178*  --  177*  --  143*  --  150*  --  152*  --   --   --   --  117*  --   BUN 16   < > 13  --  14  --  14  --  14  --  12  --  14  --  13  --   --   --   --  12  --   CREATININE 1.50*   < > 1.76*  --  1.91*  --  1.90*  --  1.93*  --  1.66*  --  1.59*  --  1.51*  --   --   --   --  1.49*  --   CALCIUM 8.9  --  7.4*  --  7.3*  --  7.4*  --  7.6*  --  7.5*  --  7.2*  --  7.4*  --   --   --   --  7.6*  --   MG 1.9  --  1.8  --  2.6*  --  2.0  --   --   --   --   --   --   --   --   --   --   --   --   --   --    < > = values in this interval not displayed.     Liver Function Tests: Recent Labs  Lab 05/31/2021 2055 06/06/21 0402 06/09/2021 0232 06/08/21 0302 06/09/21 0420  AST 889* 771* 212* 87* 56*  ALT 290* 221* 53* 16 9  ALKPHOS 25* 26* 22* 28* 39  BILITOT 0.7 0.7 0.7 1.1 0.7  PROT 3.6* 3.8* 4.2* 3.7* 4.0*  ALBUMIN 2.2* 2.6* 3.2* 2.4* 2.4*    No results for input(s): LIPASE, AMYLASE in the last 168 hours. No results for input(s): AMMONIA in the last 168 hours.  CBC: Recent Labs  Lab 05/22/2021 1613 05/25/2021 1618 06/08/21 0302 06/08/21 0304 06/08/21 1613 06/08/21 1614 06/09/21 0017 06/09/21 0019 06/09/21 0220 06/09/21 0420 06/09/21 0611  WBC 7.6  --  8.2  --  7.9  --  6.7  --   --  6.9   --   HGB 7.1*   < > 8.2*   < > 10.1*   < > 8.7* 8.2* 8.2* 8.3* 7.8*  HCT 22.0*   < > 24.7*   < > 29.8*   < > 25.8* 24.0* 24.0* 25.1* 23.0*  MCV 85.6  --  88.8  --  87.6  --  87.5  --   --  88.4  --   PLT 88*  --  57*  --  38*  --  72*  --   --  71*  --    < > = values in this interval not displayed.     Cardiac Enzymes: No results for input(s): CKTOTAL, CKMB, CKMBINDEX, TROPONINI in the last 168 hours.  BNP: BNP (last 3 results) No results for input(s): BNP in the last 8760 hours.  ProBNP (last 3 results) No results for input(s): PROBNP in the last 8760 hours.    Other results:  Imaging: DG CHEST PORT 1 VIEW  Result Date: 06/09/2021 CLINICAL DATA:  History of ECMO. EXAM: PORTABLE CHEST 1 VIEW COMPARISON:  Chest radiograph June 08, 2021. FINDINGS: PA catheter stable in position. ET tube mid trachea. Enteric tube courses inferior to the diaphragm. Stable positioning of large bore central venous catheters for ECMO. Impella device redemonstrated.  Left chest tube redemonstrated. Stable cardiac and mediastinal contours. Interval consolidation of the right upper lobe most compatible with atelectasis. Similar left basilar heterogeneous opacities. No definite pleural effusion. IMPRESSION: Interval consolidation right upper lobe most compatible with atelectasis. Similar patchy opacities left lung base favored to represent atelectasis. Similar support lines and tubes. Electronically Signed   By: Lovey Newcomer M.D.   On: 06/09/2021 08:13   DG CHEST PORT 1 VIEW  Result Date: 06/08/2021 CLINICAL DATA:  ECMO EXAM: PORTABLE CHEST 1 VIEW COMPARISON:  06/19/2021 FINDINGS: Endotracheal tube in good position. NG tube in the stomach. Impella device in the left ventricle unchanged. Large bore central venous catheters bilaterally for ECMO unchanged in position. Swan-Ganz catheter in the right pulmonary artery. Improved aeration in the left lung. Left basilar chest tube in place. Minimal right lower lobe  atelectasis. No edema or pneumothorax. IMPRESSION: Support lines unchanged and in good position. Improved aeration in the left lung compared to the prior study. Left chest tube unchanged in position.  No pneumothorax. Electronically Signed   By: Franchot Gallo M.D.   On: 06/08/2021 09:49     Medications:     Scheduled Medications:  sodium chloride   Intravenous Once   sodium chloride   Intravenous Once   sodium chloride   Intravenous Once   sodium chloride   Intravenous Once   acetaminophen (TYLENOL) oral liquid 160 mg/5 mL  1,000 mg Per Tube Q6H   aspirin  324 mg Per Tube Daily   bisacodyl  10 mg Rectal Daily   chlorhexidine gluconate (MEDLINE KIT)  15 mL Mouth Rinse BID   Chlorhexidine Gluconate Cloth  6 each Topical Daily   docusate  200 mg Per Tube Daily   mouth rinse  15 mL Mouth Rinse 10 times per day   pantoprazole sodium  40 mg Per Tube Daily   rosuvastatin  40 mg Per Tube q1800   sodium chloride flush  10-40 mL Intracatheter Q12H   sodium chloride flush  3 mL Intravenous Q12H    Infusions:  sodium chloride 10 mL/hr at 06/09/21 0800   sodium chloride     sodium chloride     sodium chloride     sodium chloride     sodium chloride 10 mL/hr at 06/09/21 0800   sodium chloride     albumin human Stopped (06/09/21 0125)   amiodarone 30 mg/hr (06/09/21 0800)    ceFAZolin (ANCEF) IV Stopped (06/09/21 0867)   dexmedetomidine (PRECEDEX) IV infusion 0.7 mcg/kg/hr (06/09/21 0800)   epinephrine 7 mcg/min (06/09/21 0800)   feeding supplement (PIVOT 1.5 CAL) Stopped (06/08/21 1845)   heparin 25 units/mL (Impella PURGE) in dextrose 5 % 1000 mL bag     heparin 200 Units/hr (06/09/21 0800)   HYDROmorphone 8 mg/hr (06/09/21 0837)   insulin 1.5 Units/hr (06/09/21 0800)   lactated ringers     lactated ringers 10 mL/hr at 06/09/21 0800   lactated ringers     midazolam 4 mg/hr (06/09/21 0800)   milrinone 0.25 mcg/kg/min (06/09/21 0800)   norepinephrine (LEVOPHED) Adult infusion 4  mcg/min (06/09/21 0800)    PRN Medications: sodium chloride, sodium chloride, sodium chloride, albumin human, dextrose, HYDROmorphone, lactated ringers, metoprolol tartrate, midazolam, ondansetron (ZOFRAN) IV, sodium chloride flush, sodium chloride flush   Assessment/Plan   Cardiogenic shock in setting of left main dissection and emergent CABG - chest open on V-A ECMO with Impella vent. S/p chest washout 6/17 - continue epi, NE and milrinone to maintain MAP 70s.  Will try to wean Epi some today - She is volume overloaded will attempt gentle diuresis with Lasix 20 IV - CXR concerning for hemothorax - suspect oozing from ECMO cannula. Will bronch this am to exclude mucous plug. D/w TCTS for possible repeat washout  - Continue VA ECMO support. See ECMO note for parameters - Impella at P-1 for vent. Good waveforms. Groin site ok  - TEE later today to reassess cardiac function. - Hopefully we can begin ECMO wean tomorrow and wean to Impella support only as tolerated  2. CAD with acute due to left main dissection - plan as above - no s/s ischemia - continue ASA/statin  3. Acute respiratory failure due to above - ABG ok. Oxygenation is ok - CXR this am will possible R hemothorax. D/x TCTS - Drop Sweep to 3  - Vent per CCM  4. ECMO circuit - on low-dose heparin. Running level low due to ding - Discussed dosing with PharmD personally. - goal HL 0.2-0.3  5. AKI - due to shock/ATN - SCr improved to 1.5 - continue support  6. Hypomag/hypokalemia - supp as needed  7. Anemia due to expected post-operative blood loss and ECMO circuit - transfuse to keep hgb >= 8.0.  8. Thrombocytopenia - due to critical illness. Improving - has h/o Von Willebrands deficiency - give low-dose DDAVP     CRITICAL CARE Performed by: Glori Bickers  Total critical care time: 35 minutes  Critical care time was exclusive of separately billable procedures and treating other patients.  Critical  care was necessary to treat or prevent imminent or life-threatening deterioration.  Critical care was time spent personally by me (independent of midlevel providers or residents) on the following activities: development of treatment plan with patient and/or surrogate as well as nursing, discussions with consultants, evaluation of patient's response to treatment, examination of patient, obtaining history from patient or surrogate, ordering and performing treatments and interventions, ordering and review of laboratory studies, ordering and review of radiographic studies, pulse oximetry and re-evaluation of patient's condition.    Length of Stay: 4   Glori Bickers MD 06/09/2021, 8:43 AM  Advanced Heart Failure Team Pager [phone] (M-F; [place])  Please contact [facility] for night-coverage after hours (4p -7a ) and weekends on [email or web address]

## 2021-06-10 ENCOUNTER — Inpatient Hospital Stay (HOSPITAL_COMMUNITY): Payer: 59

## 2021-06-10 ENCOUNTER — Encounter (HOSPITAL_COMMUNITY): Payer: Self-pay | Admitting: Thoracic Surgery (Cardiothoracic Vascular Surgery)

## 2021-06-10 ENCOUNTER — Inpatient Hospital Stay (HOSPITAL_COMMUNITY): Payer: 59 | Admitting: Anesthesiology

## 2021-06-10 ENCOUNTER — Encounter (HOSPITAL_COMMUNITY)
Admission: EM | Disposition: E | Payer: Self-pay | Source: Home / Self Care | Attending: Thoracic Surgery (Cardiothoracic Vascular Surgery)

## 2021-06-10 DIAGNOSIS — R570 Cardiogenic shock: Secondary | ICD-10-CM

## 2021-06-10 DIAGNOSIS — R079 Chest pain, unspecified: Secondary | ICD-10-CM | POA: Diagnosis not present

## 2021-06-10 DIAGNOSIS — I2510 Atherosclerotic heart disease of native coronary artery without angina pectoris: Secondary | ICD-10-CM | POA: Diagnosis not present

## 2021-06-10 DIAGNOSIS — Z9281 Personal history of extracorporeal membrane oxygenation (ECMO): Secondary | ICD-10-CM

## 2021-06-10 DIAGNOSIS — Z4509 Encounter for adjustment and management of other cardiac device: Secondary | ICD-10-CM

## 2021-06-10 HISTORY — PX: CANNULATION FOR ECMO (EXTRACORPOREAL MEMBRANE OXYGENATION): SHX6796

## 2021-06-10 LAB — CBC
HCT: 23.2 % — ABNORMAL LOW (ref 36.0–46.0)
HCT: 26 % — ABNORMAL LOW (ref 36.0–46.0)
HCT: 30 % — ABNORMAL LOW (ref 36.0–46.0)
Hemoglobin: 7.5 g/dL — ABNORMAL LOW (ref 12.0–15.0)
Hemoglobin: 8.8 g/dL — ABNORMAL LOW (ref 12.0–15.0)
Hemoglobin: 10.1 g/dL — ABNORMAL LOW (ref 12.0–15.0)
MCH: 30.1 pg (ref 26.0–34.0)
MCH: 30.3 pg (ref 26.0–34.0)
MCH: 29.3 pg (ref 26.0–34.0)
MCHC: 32.3 g/dL (ref 30.0–36.0)
MCHC: 33.8 g/dL (ref 30.0–36.0)
MCHC: 33.7 g/dL (ref 30.0–36.0)
MCV: 93.2 fL (ref 80.0–100.0)
MCV: 89.7 fL (ref 80.0–100.0)
MCV: 87 fL (ref 80.0–100.0)
Platelets: 44 10*3/uL — ABNORMAL LOW (ref 150–400)
Platelets: 40 10*3/uL — ABNORMAL LOW (ref 150–400)
Platelets: 45 10*3/uL — ABNORMAL LOW (ref 150–400)
RBC: 2.49 MIL/uL — ABNORMAL LOW (ref 3.87–5.11)
RBC: 2.9 MIL/uL — ABNORMAL LOW (ref 3.87–5.11)
RBC: 3.45 MIL/uL — ABNORMAL LOW (ref 3.87–5.11)
RDW: 17.5 % — ABNORMAL HIGH (ref 11.5–15.5)
RDW: 16.7 % — ABNORMAL HIGH (ref 11.5–15.5)
RDW: 15.5 % (ref 11.5–15.5)
WBC: 8.4 10*3/uL (ref 4.0–10.5)
WBC: 9.2 10*3/uL (ref 4.0–10.5)
WBC: 8.4 10*3/uL (ref 4.0–10.5)
nRBC: 2.5 % — ABNORMAL HIGH (ref 0.0–0.2)
nRBC: 3.5 % — ABNORMAL HIGH (ref 0.0–0.2)
nRBC: 3 % — ABNORMAL HIGH (ref 0.0–0.2)

## 2021-06-10 LAB — POCT I-STAT 7, (LYTES, BLD GAS, ICA,H+H)
Acid-Base Excess: 0 mmol/L (ref 0.0–2.0)
Acid-Base Excess: 0 mmol/L (ref 0.0–2.0)
Acid-base deficit: 2 mmol/L (ref 0.0–2.0)
Acid-base deficit: 1 mmol/L (ref 0.0–2.0)
Acid-base deficit: 2 mmol/L (ref 0.0–2.0)
Acid-base deficit: 3 mmol/L — ABNORMAL HIGH (ref 0.0–2.0)
Bicarbonate: 24.5 mmol/L (ref 20.0–28.0)
Bicarbonate: 24.7 mmol/L (ref 20.0–28.0)
Bicarbonate: 21.6 mmol/L (ref 20.0–28.0)
Bicarbonate: 23.1 mmol/L (ref 20.0–28.0)
Bicarbonate: 22.2 mmol/L (ref 20.0–28.0)
Bicarbonate: 22.2 mmol/L (ref 20.0–28.0)
Calcium, Ion: 1.22 mmol/L (ref 1.15–1.40)
Calcium, Ion: 1.21 mmol/L (ref 1.15–1.40)
Calcium, Ion: 1.22 mmol/L (ref 1.15–1.40)
Calcium, Ion: 1.13 mmol/L — ABNORMAL LOW (ref 1.15–1.40)
Calcium, Ion: 1.14 mmol/L — ABNORMAL LOW (ref 1.15–1.40)
Calcium, Ion: 1.23 mmol/L (ref 1.15–1.40)
HCT: 52 % — ABNORMAL HIGH (ref 36.0–46.0)
HCT: 20 % — ABNORMAL LOW (ref 36.0–46.0)
HCT: 23 % — ABNORMAL LOW (ref 36.0–46.0)
HCT: 24 % — ABNORMAL LOW (ref 36.0–46.0)
HCT: 25 % — ABNORMAL LOW (ref 36.0–46.0)
HCT: 31 % — ABNORMAL LOW (ref 36.0–46.0)
Hemoglobin: 17.7 g/dL — ABNORMAL HIGH (ref 12.0–15.0)
Hemoglobin: 6.8 g/dL — CL (ref 12.0–15.0)
Hemoglobin: 7.8 g/dL — ABNORMAL LOW (ref 12.0–15.0)
Hemoglobin: 8.2 g/dL — ABNORMAL LOW (ref 12.0–15.0)
Hemoglobin: 8.5 g/dL — ABNORMAL LOW (ref 12.0–15.0)
Hemoglobin: 10.5 g/dL — ABNORMAL LOW (ref 12.0–15.0)
O2 Saturation: 100 %
O2 Saturation: 100 %
O2 Saturation: 98 %
O2 Saturation: 100 %
O2 Saturation: 93 %
O2 Saturation: 95 %
Patient temperature: 36.6
Patient temperature: 36.5
Patient temperature: 36.6
Patient temperature: 36.3
Potassium: 5.2 mmol/L — ABNORMAL HIGH (ref 3.5–5.1)
Potassium: 5.1 mmol/L (ref 3.5–5.1)
Potassium: 4.8 mmol/L (ref 3.5–5.1)
Potassium: 4.5 mmol/L (ref 3.5–5.1)
Potassium: 4.3 mmol/L (ref 3.5–5.1)
Potassium: 4.8 mmol/L (ref 3.5–5.1)
Sodium: 139 mmol/L (ref 135–145)
Sodium: 139 mmol/L (ref 135–145)
Sodium: 137 mmol/L (ref 135–145)
Sodium: 138 mmol/L (ref 135–145)
Sodium: 139 mmol/L (ref 135–145)
Sodium: 138 mmol/L (ref 135–145)
TCO2: 26 mmol/L (ref 22–32)
TCO2: 26 mmol/L (ref 22–32)
TCO2: 23 mmol/L (ref 22–32)
TCO2: 24 mmol/L (ref 22–32)
TCO2: 23 mmol/L (ref 22–32)
TCO2: 23 mmol/L (ref 22–32)
pCO2 arterial: 47.8 mmHg (ref 32.0–48.0)
pCO2 arterial: 40.8 mmHg (ref 32.0–48.0)
pCO2 arterial: 28.6 mmHg — ABNORMAL LOW (ref 32.0–48.0)
pCO2 arterial: 30.4 mmHg — ABNORMAL LOW (ref 32.0–48.0)
pCO2 arterial: 35.9 mmHg (ref 32.0–48.0)
pCO2 arterial: 39 mmHg (ref 32.0–48.0)
pH, Arterial: 7.315 — ABNORMAL LOW (ref 7.350–7.450)
pH, Arterial: 7.387 (ref 7.350–7.450)
pH, Arterial: 7.486 — ABNORMAL HIGH (ref 7.350–7.450)
pH, Arterial: 7.49 — ABNORMAL HIGH (ref 7.350–7.450)
pH, Arterial: 7.399 (ref 7.350–7.450)
pH, Arterial: 7.36 (ref 7.350–7.450)
pO2, Arterial: 385 mmHg — ABNORMAL HIGH (ref 83.0–108.0)
pO2, Arterial: 379 mmHg — ABNORMAL HIGH (ref 83.0–108.0)
pO2, Arterial: 101 mmHg (ref 83.0–108.0)
pO2, Arterial: 331 mmHg — ABNORMAL HIGH (ref 83.0–108.0)
pO2, Arterial: 67 mmHg — ABNORMAL LOW (ref 83.0–108.0)
pO2, Arterial: 75 mmHg — ABNORMAL LOW (ref 83.0–108.0)

## 2021-06-10 LAB — BASIC METABOLIC PANEL
Anion gap: 4 — ABNORMAL LOW (ref 5–15)
Anion gap: 7 (ref 5–15)
BUN: 14 mg/dL (ref 6–20)
BUN: 13 mg/dL (ref 6–20)
CO2: 23 mmol/L (ref 22–32)
CO2: 22 mmol/L (ref 22–32)
Calcium: 7.7 mg/dL — ABNORMAL LOW (ref 8.9–10.3)
Calcium: 8.2 mg/dL — ABNORMAL LOW (ref 8.9–10.3)
Chloride: 111 mmol/L (ref 98–111)
Chloride: 108 mmol/L (ref 98–111)
Creatinine, Ser: 1.69 mg/dL — ABNORMAL HIGH (ref 0.44–1.00)
Creatinine, Ser: 1.64 mg/dL — ABNORMAL HIGH (ref 0.44–1.00)
GFR, Estimated: 34 mL/min — ABNORMAL LOW (ref >60)
GFR, Estimated: 36 mL/min — ABNORMAL LOW (ref >60)
Glucose, Bld: 134 mg/dL — ABNORMAL HIGH (ref 70–99)
Glucose, Bld: 124 mg/dL — ABNORMAL HIGH (ref 70–99)
Potassium: 5.3 mmol/L — ABNORMAL HIGH (ref 3.5–5.1)
Potassium: 4.2 mmol/L (ref 3.5–5.1)
Sodium: 138 mmol/L (ref 135–145)
Sodium: 137 mmol/L (ref 135–145)

## 2021-06-10 LAB — HEPATIC FUNCTION PANEL
ALT: 7 U/L (ref 0–44)
AST: 50 U/L — ABNORMAL HIGH (ref 15–41)
Albumin: 2.1 g/dL — ABNORMAL LOW (ref 3.5–5.0)
Alkaline Phosphatase: 45 U/L (ref 38–126)
Bilirubin, Direct: 0.1 mg/dL (ref 0.0–0.2)
Indirect Bilirubin: 0.5 mg/dL (ref 0.3–0.9)
Total Bilirubin: 0.6 mg/dL (ref 0.3–1.2)
Total Protein: 4.1 g/dL — ABNORMAL LOW (ref 6.5–8.1)

## 2021-06-10 LAB — POCT I-STAT, CHEM 8
BUN: 14 mg/dL (ref 6–20)
Calcium, Ion: 1.19 mmol/L (ref 1.15–1.40)
Chloride: 105 mmol/L (ref 98–111)
Creatinine, Ser: 1.7 mg/dL — ABNORMAL HIGH (ref 0.44–1.00)
Glucose, Bld: 131 mg/dL — ABNORMAL HIGH (ref 70–99)
HCT: 21 % — ABNORMAL LOW (ref 36.0–46.0)
Hemoglobin: 7.1 g/dL — ABNORMAL LOW (ref 12.0–15.0)
Potassium: 4.4 mmol/L (ref 3.5–5.1)
Sodium: 137 mmol/L (ref 135–145)
TCO2: 24 mmol/L (ref 22–32)

## 2021-06-10 LAB — PROTIME-INR
INR: 1.3 — ABNORMAL HIGH (ref 0.8–1.2)
Prothrombin Time: 15.7 seconds — ABNORMAL HIGH (ref 11.4–15.2)

## 2021-06-10 LAB — LACTATE DEHYDROGENASE: LDH: 435 U/L — ABNORMAL HIGH (ref 98–192)

## 2021-06-10 LAB — GLUCOSE, CAPILLARY
Glucose-Capillary: 113 mg/dL — ABNORMAL HIGH (ref 70–99)
Glucose-Capillary: 120 mg/dL — ABNORMAL HIGH (ref 70–99)
Glucose-Capillary: 140 mg/dL — ABNORMAL HIGH (ref 70–99)
Glucose-Capillary: 130 mg/dL — ABNORMAL HIGH (ref 70–99)
Glucose-Capillary: 135 mg/dL — ABNORMAL HIGH (ref 70–99)
Glucose-Capillary: 123 mg/dL — ABNORMAL HIGH (ref 70–99)

## 2021-06-10 LAB — ECHO INTRAOPERATIVE TEE
Height: 63 in
Single Plane A4C EF: 15.1 %
Weight: 3379.21 oz

## 2021-06-10 LAB — LACTIC ACID, PLASMA
Lactic Acid, Venous: 0.7 mmol/L (ref 0.5–1.9)
Lactic Acid, Venous: 0.9 mmol/L (ref 0.5–1.9)

## 2021-06-10 LAB — PREPARE RBC (CROSSMATCH)

## 2021-06-10 LAB — COOXEMETRY PANEL
Carboxyhemoglobin: 1.1 % (ref 0.5–1.5)
Carboxyhemoglobin: 1.1 % (ref 0.5–1.5)
Methemoglobin: 1.2 % (ref 0.0–1.5)
Methemoglobin: 1.3 % (ref 0.0–1.5)
O2 Saturation: 71.1 %
O2 Saturation: 62.3 %
Total hemoglobin: 9 g/dL — ABNORMAL LOW (ref 12.0–16.0)
Total hemoglobin: 7.9 g/dL — ABNORMAL LOW (ref 12.0–16.0)

## 2021-06-10 LAB — APTT: aPTT: 108 seconds — ABNORMAL HIGH (ref 24–36)

## 2021-06-10 LAB — FIBRINOGEN: Fibrinogen: 586 mg/dL — ABNORMAL HIGH (ref 210–475)

## 2021-06-10 LAB — HEPARIN LEVEL (UNFRACTIONATED): Heparin Unfractionated: 0.13 IU/mL — ABNORMAL LOW (ref 0.30–0.70)

## 2021-06-10 SURGERY — CANNULATION FOR ECMO (EXTRACORPOREAL MEMBRANE OXYGENATION)
Anesthesia: General

## 2021-06-10 MED ORDER — HEMOSTATIC AGENTS (NO CHARGE) OPTIME
TOPICAL | Status: DC | PRN
  Administered 2021-06-10: 1 via TOPICAL

## 2021-06-10 MED ORDER — SODIUM CHLORIDE 0.9% IV SOLUTION: Freq: Once | INTRAVENOUS | Status: DC

## 2021-06-10 MED ORDER — FUROSEMIDE 10 MG/ML IJ SOLN
20.0000 mg | Freq: Once | INTRAMUSCULAR | Status: AC
  Administered 2021-06-10: 20 mg via INTRAVENOUS
  Filled 2021-06-10: qty 2

## 2021-06-10 MED ORDER — PROPOFOL 10 MG/ML IV BOLUS
INTRAVENOUS | Status: AC
  Filled 2021-06-10: qty 20

## 2021-06-10 MED ORDER — CALCIUM CHLORIDE 10 % IV SOLN
INTRAVENOUS | Status: DC | PRN
  Administered 2021-06-10: 400 mg via INTRAVENOUS
  Administered 2021-06-10: 200 mg via INTRAVENOUS

## 2021-06-10 MED ORDER — PHENYLEPHRINE 40 MCG/ML (10ML) SYRINGE FOR IV PUSH (FOR BLOOD PRESSURE SUPPORT)
PREFILLED_SYRINGE | INTRAVENOUS | Status: AC
  Filled 2021-06-10: qty 10

## 2021-06-10 MED ORDER — SODIUM CHLORIDE 0.9 % IV SOLN: 10.0000 mL/h | Freq: Once | INTRAVENOUS | Status: DC

## 2021-06-10 MED ORDER — MIDAZOLAM HCL 2 MG/2ML IJ SOLN
INTRAMUSCULAR | Status: AC
  Filled 2021-06-10: qty 2

## 2021-06-10 MED ORDER — VASOPRESSIN 20 UNITS/100 ML INFUSION FOR SHOCK
0.0000 [IU]/min | INTRAVENOUS | Status: DC
  Administered 2021-06-10: 0.03 [IU]/min via INTRAVENOUS
  Administered 2021-06-10 – 2021-06-11 (×2): 0.02 [IU]/min via INTRAVENOUS
  Administered 2021-06-12 (×3): 0.03 [IU]/min via INTRAVENOUS
  Administered 2021-06-13: 0.04 [IU]/min via INTRAVENOUS
  Administered 2021-06-13: 0.03 [IU]/min via INTRAVENOUS
  Administered 2021-06-14 (×3): 0.04 [IU]/min via INTRAVENOUS
  Administered 2021-06-15 – 2021-06-17 (×2): 0.03 [IU]/min via INTRAVENOUS
  Filled 2021-06-10 (×14): qty 100

## 2021-06-10 MED ORDER — VANCOMYCIN HCL 1250 MG/250ML IV SOLN
1250.0000 mg | Freq: Once | INTRAVENOUS | Status: AC
  Administered 2021-06-10: 1250 mg via INTRAVENOUS
  Filled 2021-06-10: qty 250

## 2021-06-10 MED ORDER — PLASMA-LYTE A IV SOLN
INTRAVENOUS | Status: DC
  Filled 2021-06-10: qty 5

## 2021-06-10 MED ORDER — FENTANYL CITRATE (PF) 250 MCG/5ML IJ SOLN
INTRAMUSCULAR | Status: DC | PRN
  Administered 2021-06-10: 100 ug via INTRAVENOUS
  Administered 2021-06-10: 50 ug via INTRAVENOUS
  Administered 2021-06-10: 100 ug via INTRAVENOUS

## 2021-06-10 MED ORDER — MIDAZOLAM HCL 2 MG/2ML IJ SOLN
INTRAMUSCULAR | Status: DC | PRN
  Administered 2021-06-10: 2 mg via INTRAVENOUS

## 2021-06-10 MED ORDER — FENTANYL CITRATE (PF) 250 MCG/5ML IJ SOLN
INTRAMUSCULAR | Status: AC
  Filled 2021-06-10: qty 5

## 2021-06-10 MED ORDER — SODIUM CHLORIDE (PF) 0.9 % IJ SOLN
INTRAMUSCULAR | Status: AC
  Filled 2021-06-10: qty 20

## 2021-06-10 MED ORDER — SODIUM CHLORIDE (PF) 0.9 % IJ SOLN
INTRAMUSCULAR | Status: AC
  Filled 2021-06-10: qty 10

## 2021-06-10 MED ORDER — SENNOSIDES 8.8 MG/5ML PO SYRP
10.0000 mL | ORAL_SOLUTION | Freq: Every day | ORAL | Status: DC
  Administered 2021-06-10 – 2021-06-17 (×8): 10 mL
  Filled 2021-06-10 (×8): qty 10

## 2021-06-10 MED ORDER — PAPAVERINE HCL 30 MG/ML IJ SOLN
INTRAMUSCULAR | Status: DC | PRN
  Administered 2021-06-10: 1000 mL

## 2021-06-10 MED ORDER — ROCURONIUM BROMIDE 10 MG/ML (PF) SYRINGE
PREFILLED_SYRINGE | INTRAVENOUS | Status: DC | PRN
  Administered 2021-06-10 (×2): 100 mg via INTRAVENOUS

## 2021-06-10 MED ORDER — SODIUM CHLORIDE (PF) 0.9 % IJ SOLN
OROMUCOSAL | Status: DC | PRN
  Administered 2021-06-10: 4 mL via TOPICAL

## 2021-06-10 MED ORDER — CEFAZOLIN SODIUM-DEXTROSE 2-4 GM/100ML-% IV SOLN
2.0000 g | INTRAVENOUS | Status: AC
  Administered 2021-06-10: 2 g via INTRAVENOUS
  Filled 2021-06-10: qty 100

## 2021-06-10 MED ORDER — SODIUM CHLORIDE 0.9 % IR SOLN
Status: DC | PRN
  Administered 2021-06-10: 3000 mL

## 2021-06-10 MED ORDER — VASOPRESSIN 20 UNIT/ML IV SOLN
INTRAVENOUS | Status: AC
  Filled 2021-06-10: qty 1

## 2021-06-10 MED ORDER — SODIUM CHLORIDE 0.9 % IV SOLN
0.0000 mg/h | INTRAVENOUS | Status: DC
  Administered 2021-06-10 (×2): 8 mg/h via INTRAVENOUS
  Administered 2021-06-11: 4 mg/h via INTRAVENOUS
  Administered 2021-06-11: 8 mg/h via INTRAVENOUS
  Administered 2021-06-12 – 2021-06-14 (×4): 6 mg/h via INTRAVENOUS
  Filled 2021-06-10 (×9): qty 10

## 2021-06-10 MED ORDER — ROCURONIUM BROMIDE 10 MG/ML (PF) SYRINGE
PREFILLED_SYRINGE | INTRAVENOUS | Status: AC
  Filled 2021-06-10: qty 10

## 2021-06-10 MED ORDER — EPINEPHRINE 1 MG/10ML IJ SOSY
PREFILLED_SYRINGE | INTRAMUSCULAR | Status: AC
  Filled 2021-06-10: qty 10

## 2021-06-10 SURGICAL SUPPLY — 103 items
ADAPTER CARDIO PERF ANTE/RETRO (ADAPTER) IMPLANT
ADPR PRFSN 84XANTGRD RTRGD (ADAPTER)
APPLIER CLIP 9.375 MED OPEN (MISCELLANEOUS)
APPLIER CLIP 9.375 SM OPEN (CLIP)
APR CLP MED 9.3 20 MLT OPN (MISCELLANEOUS)
APR CLP SM 9.3 20 MLT OPN (CLIP)
BAG DECANTER FOR FLEXI CONT (MISCELLANEOUS) ×3 IMPLANT
BANDAGE ESMARK 6X9 LF (GAUZE/BANDAGES/DRESSINGS) IMPLANT
BLADE CLIPPER SURG (BLADE) ×3 IMPLANT
BLADE SURG 11 STRL SS (BLADE) IMPLANT
BNDG CMPR 9X6 STRL LF SNTH (GAUZE/BANDAGES/DRESSINGS) ×1
BNDG ESMARK 6X9 LF (GAUZE/BANDAGES/DRESSINGS) ×3
CANISTER SUCT 3000ML PPV (MISCELLANEOUS) ×3 IMPLANT
CANNULA GUNDRY RCSP 15FR (MISCELLANEOUS) IMPLANT
CATH THORACIC 28FR (CATHETERS) IMPLANT
CLIP APPLIE 9.375 MED OPEN (MISCELLANEOUS) IMPLANT
CLIP APPLIE 9.375 SM OPEN (CLIP) IMPLANT
CLIP FOGARTY SPRING 6M (CLIP) IMPLANT
CLIP VESOCCLUDE MED 24/CT (CLIP) IMPLANT
CLIP VESOCCLUDE MED 6/CT (CLIP) IMPLANT
CLIP VESOCCLUDE SM WIDE 24/CT (CLIP) IMPLANT
CONN Y 3/8X3/8X3/8  BEN (MISCELLANEOUS)
CONN Y 3/8X3/8X3/8 BEN (MISCELLANEOUS) IMPLANT
COVER SURGICAL LIGHT HANDLE (MISCELLANEOUS) IMPLANT
DRAPE CARDIOVASCULAR INCISE (DRAPES) ×3
DRAPE CV SPLIT W-CLR ANES SCRN (DRAPES) ×2 IMPLANT
DRAPE INCISE IOBAN 66X45 STRL (DRAPES) ×2 IMPLANT
DRAPE PERI GROIN 82X75IN TIB (DRAPES) ×2 IMPLANT
DRAPE SRG 135X102X78XABS (DRAPES) ×1 IMPLANT
DRSG COVADERM 4X14 (GAUZE/BANDAGES/DRESSINGS) ×3 IMPLANT
DRSG TEGADERM 4X4.5 CHG (GAUZE/BANDAGES/DRESSINGS) ×2 IMPLANT
ELECT CAUTERY BLADE 6.4 (BLADE) IMPLANT
ELECT REM PT RETURN 9FT ADLT (ELECTROSURGICAL) ×6
ELECTRODE REM PT RTRN 9FT ADLT (ELECTROSURGICAL) ×2 IMPLANT
GAUZE SPONGE 4X4 12PLY STRL (GAUZE/BANDAGES/DRESSINGS) ×8 IMPLANT
GLOVE BIO SURGEON STRL SZ 6 (GLOVE) IMPLANT
GLOVE BIO SURGEON STRL SZ 6.5 (GLOVE) IMPLANT
GLOVE BIO SURGEON STRL SZ7 (GLOVE) IMPLANT
GLOVE BIO SURGEON STRL SZ7.5 (GLOVE) IMPLANT
GLOVE BIO SURGEONS STRL SZ 6.5 (GLOVE)
GLOVE SKINSENSE NS SZ6.5 (GLOVE)
GLOVE SKINSENSE STRL SZ6.5 (GLOVE) IMPLANT
GLOVE SURG MICRO LTX SZ6 (GLOVE) ×2 IMPLANT
GLOVE SURG SIGNA 7.5 PF LTX (GLOVE) ×6 IMPLANT
GOWN STRL REUS W/ TWL LRG LVL3 (GOWN DISPOSABLE) ×3 IMPLANT
GOWN STRL REUS W/ TWL XL LVL3 (GOWN DISPOSABLE) ×2 IMPLANT
GOWN STRL REUS W/TWL LRG LVL3 (GOWN DISPOSABLE) ×12
GOWN STRL REUS W/TWL XL LVL3 (GOWN DISPOSABLE) ×6
HEMOSTAT POWDER SURGIFOAM 1G (HEMOSTASIS) ×4 IMPLANT
HEMOSTAT SURGICEL 2X14 (HEMOSTASIS) IMPLANT
INSERT FOGARTY XLG (MISCELLANEOUS) IMPLANT
KIT BASIN OR (CUSTOM PROCEDURE TRAY) ×3 IMPLANT
KIT SUCTION CATH 14FR (SUCTIONS) IMPLANT
KIT TURNOVER KIT B (KITS) ×3 IMPLANT
LINE VENT (MISCELLANEOUS) IMPLANT
NS IRRIG 1000ML POUR BTL (IV SOLUTION) ×15 IMPLANT
PACK CHEST (CUSTOM PROCEDURE TRAY) ×3 IMPLANT
PACK E OPEN HEART (SUTURE) IMPLANT
PAD ARMBOARD 7.5X6 YLW CONV (MISCELLANEOUS) ×6 IMPLANT
PAD ELECT DEFIB RADIOL ZOLL (MISCELLANEOUS) ×3 IMPLANT
POSITIONER HEAD DONUT 9IN (MISCELLANEOUS) ×3 IMPLANT
POWDER SURGICEL 3.0 GRAM (HEMOSTASIS) ×2 IMPLANT
SET CARDIOPLEGIA MPS 5001102 (MISCELLANEOUS) IMPLANT
SPONGE LAP 18X18 RF (DISPOSABLE) IMPLANT
SPONGE LAP 4X18 RFD (DISPOSABLE) IMPLANT
SUT ETHIBOND 2 0 SH (SUTURE) ×3
SUT ETHIBOND 2 0 SH 36X2 (SUTURE) IMPLANT
SUT MNCRL AB 4-0 PS2 18 (SUTURE) IMPLANT
SUT PROLENE 2 0 MH 48 (SUTURE) ×4 IMPLANT
SUT PROLENE 3 0 SH 1 (SUTURE) IMPLANT
SUT PROLENE 3 0 SH 48 (SUTURE) ×10 IMPLANT
SUT PROLENE 4 0 RB 1 (SUTURE) ×3
SUT PROLENE 4-0 RB1 .5 CRCL 36 (SUTURE) IMPLANT
SUT PROLENE 5 0 C 1 36 (SUTURE) IMPLANT
SUT PROLENE 6 0 C 1 30 (SUTURE) IMPLANT
SUT PROLENE 7 0 BV 1 (SUTURE) IMPLANT
SUT PROLENE 7 0 BV1 MDA (SUTURE) IMPLANT
SUT PROLENE 8 0 BV175 6 (SUTURE) IMPLANT
SUT SILK  1 MH (SUTURE) ×6
SUT SILK 1 MH (SUTURE) ×1 IMPLANT
SUT SILK 1 TIES 10X30 (SUTURE) ×2 IMPLANT
SUT SILK 2 0 SH CR/8 (SUTURE) ×2 IMPLANT
SUT SILK 3 0 SH CR/8 (SUTURE) ×2 IMPLANT
SUT STEEL 6MS V (SUTURE) ×2 IMPLANT
SUT STEEL STERNAL CCS#1 18IN (SUTURE) IMPLANT
SUT STEEL SZ 6 DBL 3X14 BALL (SUTURE) ×2 IMPLANT
SUT VIC AB 1 CTX 36 (SUTURE) ×12
SUT VIC AB 1 CTX36XBRD ANBCTR (SUTURE) ×2 IMPLANT
SUT VIC AB 2-0 CT1 36 (SUTURE) IMPLANT
SUT VIC AB 2-0 CTX 27 (SUTURE) ×6 IMPLANT
SUT VIC AB 3-0 SH 27 (SUTURE)
SUT VIC AB 3-0 SH 27X BRD (SUTURE) IMPLANT
SUT VIC AB 3-0 X1 27 (SUTURE) ×6 IMPLANT
SUT VICRYL 4-0 PS2 18IN ABS (SUTURE) IMPLANT
SWAB COLLECTION DEVICE MRSA (MISCELLANEOUS) ×2 IMPLANT
SWAB CULTURE ESWAB REG 1ML (MISCELLANEOUS) ×2 IMPLANT
SYSTEM SAHARA CHEST DRAIN ATS (WOUND CARE) IMPLANT
TOWEL GREEN STERILE (TOWEL DISPOSABLE) ×3 IMPLANT
TRAY CATH LUMEN 1 20CM STRL (SET/KITS/TRAYS/PACK) IMPLANT
TUBING LAP HI FLOW INSUFFLATIO (TUBING) IMPLANT
UNDERPAD 30X36 HEAVY ABSORB (UNDERPADS AND DIAPERS) ×3 IMPLANT
WATER STERILE IRR 1000ML POUR (IV SOLUTION) ×6 IMPLANT
YANKAUER SUCT BULB TIP NO VENT (SUCTIONS) ×2 IMPLANT

## 2021-06-10 NOTE — OR Nursing (Signed)
Pt to OR 17 with ECMO and Impella in place. Arterial and venous cannulae in place in chest sutured and secured.  5 Red Keepers with cannulaes secured in chest.  Pt had pressure moon boots on bilateral feet.  Chest is open with esmark and ioban covering the chest.  Pt intubated and and unresponsive.

## 2021-06-10 NOTE — Progress Notes (Signed)
Pharmacy Antibiotic Note  [name] [name] is a 60 y.o. female admitted on 06/06/2021 with surgical prophylaxis.  Pharmacy has been consulted for vancomycin dosing. Patient is on cefazolin for open chest prophylaxis.   ClCr ~73ml/min. ECMO decannulation 6/20. Contiues on impella.   Plan: Vancomycin 1250mg  x1  Continue cefazolin per teacm  Height: 5\' 3"  (160 cm) Weight: 95.8 kg (211 lb 3.2 oz) IBW/kg (Calculated) : 52.4  Temp (24hrs), Avg:97.8 F (36.6 C), Min:97.7 F (36.5 C), Max:97.9 F (36.6 C)  Recent Labs  Lab 05/31/2021 2055 06/06/21 0402 06/06/21 1546 06/01/2021 0232 06/09/2021 0919 06/08/21 1613 06/09/21 0017 06/09/21 0420 06/09/21 1607 05/22/2021 0322 06/11/2021 0323 06/09/2021 0921 06/18/2021 1422  WBC 11.3* 9.9   < > 7.8   < > 7.9 6.7 6.9 7.7 8.4  --  9.2  --   CREATININE 1.76* 1.91*   < > 1.93*   < > 1.51*  --  1.49* 1.65* 1.69*  --   --  1.70*  LATICACIDVEN 7.5* 5.1*  --  1.2  --   --   --   --   --   --  0.7 0.9  --    < > = values in this interval not displayed.    Estimated Creatinine Clearance: 38.8 mL/min (A) (by C-G formula based on SCr of 1.7 mg/dL (H)).    No Known Allergies  Antimicrobials this admission: Vanc 6/15-6/16, 6/20 x1 Cefaz 6/15 >>     Microbiology results: 6/16 MRSA PCR: neg  Thank you for allowing pharmacy to be a part of this patient's care.  Benetta Spar, PharmD, BCPS, BCCP Clinical Pharmacist  Please check AMION for all [place] phone numbers After 10:00 PM, call [place] [phone]

## 2021-06-10 NOTE — Progress Notes (Signed)
3 Days Post-Op Procedure(s) (LRB): Reexploration and evacuation of hematoma (N/A) Subjective: Intubated, sedated  Objective: Vital signs in last 24 hours: Temp:  [97.52 F (36.4 C)-97.9 F (36.6 C)] 97.7 F (36.5 C) (06/20 0722) Pulse Rate:  [78-93] 80 (06/20 0738) Cardiac Rhythm: Normal sinus rhythm (06/20 0400) Resp:  [15-18] 18 (06/20 0738) BP: (81)/(69) 81/69 (06/20 0738) SpO2:  [100 %] 100 % (06/20 0738) Arterial Line BP: (73-98)/(61-76) 87/72 (06/20 0722) FiO2 (%):  [30 %] 30 % (06/20 0738) Weight:  [95.8 kg] 95.8 kg (06/20 0500)  Hemodynamic parameters for last 24 hours: PAP: (20-32)/(11-15) 22/12 CVP:  [7 mmHg] 7 mmHg  Intake/Output from previous day: 06/19 0701 - 06/20 0700 In: 3656.6 [I.V.:2445.8; NG/GT:510; IV Piggyback:309.4] Out: 2990 [Urine:1910; Emesis/NG output:600; Chest Tube:480] Intake/Output this shift: Total I/O In: 315 [Blood:315] Out: 300 [Urine:300]  General appearance: intubated, sedated Neurologic: sedated, has moved all ext Heart: regular rate and rhythm Lungs: coarse BS bilat Wound: esmark in place, some oozing  Lab Results: Recent Labs    06/09/21 1607 06/09/21 1947 05/22/2021 0322 05/22/2021 0515  WBC 7.7  --  8.4  --   HGB 8.0*   < > 7.5* 6.8*  HCT 24.5*   < > 23.2* 20.0*  PLT 53*  --  44*  --    < > = values in this interval not displayed.   BMET:  Recent Labs    06/09/21 1607 06/09/21 1947 05/27/2021 0322 06/08/2021 0515  NA 138   < > 138 139  K 5.2*   < > 5.3* 5.1  CL 112*  --  111  --   CO2 22  --  23  --   GLUCOSE 145*  --  134*  --   BUN 12  --  14  --   CREATININE 1.65*  --  1.69*  --   CALCIUM 7.5*  --  7.7*  --    < > = values in this interval not displayed.    PT/INR:  Recent Labs    06/18/2021 0322  LABPROT 15.7*  INR 1.3*   ABG    Component Value Date/Time   PHART 7.387 05/29/2021 0515   HCO3 24.7 06/11/2021 0515   TCO2 26 06/01/2021 0515   ACIDBASEDEF 2.0 05/23/2021 0312   O2SAT 100.0 06/11/2021 0515    CBG (last 3)  Recent Labs    06/09/21 1943 06/09/21 2358 05/24/2021 0310  GLUCAP 114* 120* 113*    Assessment/Plan: S/P Procedure(s) (LRB): Reexploration and evacuation of hematoma (N/A) - Remains critically ill Rounded with Dr. Haroldine Laws and [place]- will decrease ECMO flow and increase Impella and monitor Anemia- transfuse Renal function stable VDRF vent per CCM, obviously no wean at present   LOS: 5 days    [name] [name] 06/20/2021

## 2021-06-10 NOTE — Progress Notes (Signed)
Flow warning below 0.5L on ECMO.  Flow decreased on the impella and increased on the Cardiohelp.

## 2021-06-10 NOTE — Progress Notes (Signed)
Advanced Heart Failure Rounding Note   Subjective:    6/15 - LM coronary scad with emergent CABG  -> ECMO 6/17 - chest washout at bedside 6/19 - bronch due to RUL mucous plugging    Remains on VA ECMO with Impella vent (see ECMO note)  On epi 7, NE 7, milrinone 0.25 and IV amio  MAPS 60-70s  Continues to have bleeding at ECMO cannulation site after she moved. Got several units of RBCs and PLTs yesterday.   CXR improved after removal of RUL mucous plug   Remains intubated/sedated/ Now on versed and dilaudid.   Co-ox 71%  Creatinine 1.8 -> 1.9 -> 1.9 -> 1.6 -> 1.5 -> 1.7 LDH 450 -> 435  CTs draining about 10cc/hr  Heparin level  0.12  PLTs 57K. - > 44k  Hgb 8.3 -> 7.5    Objective:   Weight Range:  Vital Signs:   Temp:  [97.52 F (36.4 C)-97.9 F (36.6 C)] 97.7 F (36.5 C) (06/20 0722) Pulse Rate:  [78-93] 80 (06/20 0738) Resp:  [15-18] 18 (06/20 0738) BP: (81)/(69) 81/69 (06/20 0738) SpO2:  [100 %] 100 % (06/20 0738) Arterial Line BP: (73-98)/(61-76) 87/72 (06/20 0722) FiO2 (%):  [30 %] 30 % (06/20 0738) Weight:  [95.8 kg] 95.8 kg (06/20 0500) Last BM Date: 06/03/21  Weight change: Filed Weights   06/08/21 0500 06/09/21 0443 06/06/2021 0500  Weight: 94.9 kg 97.3 kg 95.8 kg    Intake/Output:   Intake/Output Summary (Last 24 hours) at 06/18/2021 0810 Last data filed at 06/15/2021 0730 Gross per 24 hour  Intake 3848.41 ml  Output 3180 ml  Net 668.41 ml      Physical Exam: General:  Intubated/sedated HEENT: normal + ETT Neck: supple. RIJ swan. Carotids 2+ bilat; no bruits. No lymphadenopathy or thryomegaly appreciated. Cor: Chest open oozing under eschmarch  carpal tunnel syndrome and ecmo cannulas Lungs: clear Abdomen: obese soft, nontender, nondistended. No hepatosplenomegaly. No bruits or masses. Good bowel sounds. Extremities: no cyanosis, clubbing, rash, 2+edema Neuro: intubated/sedated    Telemetry: NSR 80s Personally  reviewed   Labs: Basic Metabolic Panel: Recent Labs  Lab 06/02/2021 0450 06/11/2021 1506 05/27/2021 2055 06/06/21 0012 06/06/21 0402 06/06/21 0605 06/06/21 1546 06/06/21 1557 06/08/21 0302 06/08/21 0304 06/08/21 1613 06/08/21 1614 06/09/21 0420 06/09/21 9326 06/09/21 1607 06/09/21 1947 06/18/2021 0312 06/03/2021 0322 05/25/2021 0515  NA 139   < > 144  142   < > 139   < > 141   < > 139   < > 138   < > 139   < > 138 139 139 138 139  K 3.8   < > 3.1*  2.9*   < > 3.4*   < > 3.7   < > 5.0   < > 5.0   < > 4.9   < > 5.2* 4.8 5.2* 5.3* 5.1  CL 110   < > 112*  --  112*  --  113*   < > 115*  --  114*  --  111  --  112*  --   --  111  --   CO2 23  --  17*  --  17*  --  20*   < > 21*  --  22  --  21*  --  22  --   --  23  --   GLUCOSE 81   < > 207*  --  251*  --  178*   < >  150*  --  152*  --  117*  --  145*  --   --  134*  --   BUN 16   < > 13  --  14  --  14   < > 14  --  13  --  12  --  12  --   --  14  --   CREATININE 1.50*   < > 1.76*  --  1.91*  --  1.90*   < > 1.59*  --  1.51*  --  1.49*  --  1.65*  --   --  1.69*  --   CALCIUM 8.9  --  7.4*  --  7.3*  --  7.4*   < > 7.2*  --  7.4*  --  7.6*  --  7.5*  --   --  7.7*  --   MG 1.9  --  1.8  --  2.6*  --  2.0  --   --   --   --   --   --   --   --   --   --   --   --    < > = values in this interval not displayed.     Liver Function Tests: Recent Labs  Lab 06/06/21 0402 06/03/2021 0232 06/08/21 0302 06/09/21 0420 06/02/2021 0322  AST 771* 212* 87* 56* 50*  ALT 221* 53* _0 ALKPHOS 26* 22* 28* 39 45  BILITOT 0.7 0.7 1.1 0.7 0.6  PROT 3.8* 4.2* 3.7* 4.0* 4.1*  ALBUMIN 2.6* 3.2* 2.4* 2.4* 2.1*    No results for input(s): LIPASE, AMYLASE in the last 168 hours. No results for input(s): AMMONIA in the last 168 hours.  CBC: Recent Labs  Lab 06/08/21 1613 06/08/21 1614 06/09/21 0017 06/09/21 0019 06/09/21 0420 06/09/21 1607 06/09/21 1607 06/09/21 1947 06/02/2021 0312 05/30/2021 0322 06/18/2021 0515  WBC 7.9  --  6.7  --  6.9   --  7.7  --   --  8.4  --   HGB 10.1*   < > 8.7*   < > 8.3*   < > 8.0* 8.2* 17.7* 7.5* 6.8*  HCT 29.8*   < > 25.8*   < > 25.1*   < > 24.5* 24.0* 52.0* 23.2* 20.0*  MCV 87.6  --  87.5  --  88.4  --  91.1  --   --  93.2  --   PLT 38*  --  72*  --  71*  --  53*  --   --  44*  --    < > = values in this interval not displayed.     Cardiac Enzymes: No results for input(s): CKTOTAL, CKMB, CKMBINDEX, TROPONINI in the last 168 hours.  BNP: BNP (last 3 results) No results for input(s): BNP in the last 8760 hours.  ProBNP (last 3 results) No results for input(s): PROBNP in the last 8760 hours.    Other results:  Imaging: DG CHEST PORT 1 VIEW  Result Date: 06/09/2021 CLINICAL DATA:  Patient status post CABG procedure. EXAM: PORTABLE CHEST 1 VIEW COMPARISON:  Chest radiograph June 09, 2021. FINDINGS: ET tube mid trachea. PA catheter tip projects over the central pulmonary artery. ECMO catheters stable in position. Impella device redemonstrated. Left chest tube in place. Similar patchy airspace opacities left mid lower lung. Slight improved aeration of the right upper lung. IMPRESSION: Slight improved aeration of the upper lung. There is still however  patchy consolidation suggestive of atelectasis. Left basilar heterogeneous opacities. Support apparatus as above. Electronically Signed   By: Lovey Newcomer M.D.   On: 06/09/2021 13:23   DG CHEST PORT 1 VIEW  Result Date: 06/09/2021 CLINICAL DATA:  History of ECMO. EXAM: PORTABLE CHEST 1 VIEW COMPARISON:  Chest radiograph June 08, 2021. FINDINGS: PA catheter stable in position. ET tube mid trachea. Enteric tube courses inferior to the diaphragm. Stable positioning of large bore central venous catheters for ECMO. Impella device redemonstrated. Left chest tube redemonstrated. Stable cardiac and mediastinal contours. Interval consolidation of the right upper lobe most compatible with atelectasis. Similar left basilar heterogeneous opacities. No definite  pleural effusion. IMPRESSION: Interval consolidation right upper lobe most compatible with atelectasis. Similar patchy opacities left lung base favored to represent atelectasis. Similar support lines and tubes. Electronically Signed   By: Lovey Newcomer M.D.   On: 06/09/2021 08:13     Medications:     Scheduled Medications:  sodium chloride   Intravenous Once   sodium chloride   Intravenous Once   sodium chloride   Intravenous Once   sodium chloride   Intravenous Once   acetaminophen (TYLENOL) oral liquid 160 mg/5 mL  1,000 mg Per Tube Q6H   aspirin  324 mg Per Tube Daily   bisacodyl  10 mg Rectal Daily   chlorhexidine gluconate (MEDLINE KIT)  15 mL Mouth Rinse BID   Chlorhexidine Gluconate Cloth  6 each Topical Daily   docusate  200 mg Per Tube Daily   insulin aspart  1-3 Units Subcutaneous Q4H   insulin detemir  5 Units Subcutaneous Q12H   mouth rinse  15 mL Mouth Rinse 10 times per day   pantoprazole sodium  40 mg Per Tube Daily   rosuvastatin  40 mg Per Tube q1800   sodium chloride flush  10-40 mL Intracatheter Q12H   sodium chloride flush  3 mL Intravenous Q12H    Infusions:  sodium chloride Stopped (05/24/2021 0640)   sodium chloride     sodium chloride     sodium chloride     sodium chloride     sodium chloride 10 mL/hr at 06/18/2021 0700   sodium chloride     albumin human Stopped (06/09/21 0125)   amiodarone 30 mg/hr (06/16/2021 0700)    ceFAZolin (ANCEF) IV 200 mL/hr at 06/19/2021 0700   dexmedetomidine (PRECEDEX) IV infusion 0.4 mcg/kg/hr (06/15/2021 0700)   epinephrine 7 mcg/min (06/03/2021 0700)   feeding supplement (PIVOT 1.5 CAL) Stopped (06/08/21 1845)   heparin 25 units/mL (Impella PURGE) in dextrose 5 % 1000 mL bag     heparin 200 Units/hr (06/11/2021 0700)   HYDROmorphone 8 mg/hr (06/19/2021 0700)   insulin Stopped (06/09/21 1330)   lactated ringers     lactated ringers 10 mL/hr at 05/22/2021 0700   lactated ringers     midazolam 7 mg/hr (06/01/2021 0700)   milrinone 0.25  mcg/kg/min (06/11/2021 0700)   norepinephrine (LEVOPHED) Adult infusion 7 mcg/min (06/01/2021 0700)   vasopressin 0.03 Units/min (06/16/2021 0756)    PRN Medications: sodium chloride, sodium chloride, sodium chloride, albumin human, dextrose, HYDROmorphone, ipratropium-albuterol, lactated ringers, metoprolol tartrate, midazolam, ondansetron (ZOFRAN) IV, sodium chloride flush, sodium chloride flush   Assessment/Plan   Cardiogenic shock in setting of left main dissection and emergent CABG - chest open on V-A ECMO with Impella vent. S/p chest washout 6/17 - TEE 9/19 EF 35-40% - Impella at P-1. Waveforms ok. Will try to wean ECMO to Impella. See ECMO note  for parameters - D/w Dr. Roxan Hockey at bedside. Will see how she does with ECMO wean to Impella today to see if there is a change to decannualte and close. Otherwise may need repeat washout.   2. CAD with acute due to left main dissection - plan as above - no s/s ischemia - continue ASA/statin  3. Acute respiratory failure due to above - ABG ok. Oxygenation is ok - CXR improved after removal of RUL mucous plug - Keep sweep to 3  - Vent per CCM  4. ECMO circuit - on low-dose heparin. Running level low due to bleeding - Increase heparin with wean of ECMO - Discussed dosing with PharmD personally. - goal HL 0.2-0.3  5. AKI - due to shock/ATN - SCr improved now 1.7 today - continue support  6. Hypomag/hypokalemia - supp as needed  7. Anemia due to expected post-operative blood loss and ECMO circuit - transfuse to keep hgb >= 8.0.  8. Thrombocytopenia - due to critical illness. PLTs 44k - has h/o Von Willebrands deficiency - has received low-dose DDAVP    CRITICAL CARE Performed by: Glori Bickers  Total critical care time: 45 minutes  Critical care time was exclusive of separately billable procedures and treating other patients.  Critical care was necessary to treat or prevent imminent or life-threatening  deterioration.  Critical care was time spent personally by me (independent of midlevel providers or residents) on the following activities: development of treatment plan with patient and/or surrogate as well as nursing, discussions with consultants, evaluation of patient's response to treatment, examination of patient, obtaining history from patient or surrogate, ordering and performing treatments and interventions, ordering and review of laboratory studies, ordering and review of radiographic studies, pulse oximetry and re-evaluation of patient's condition.    Length of Stay: 5   Glori Bickers MD 05/29/2021, 8:10 AM  Advanced Heart Failure Team Pager [phone] (M-F; [place])  Please contact [facility] for night-coverage after hours (4p -7a ) and weekends on [email or web address]

## 2021-06-10 NOTE — Progress Notes (Addendum)
Chugging on the ECMO circuit. Patient's flow decreased on ECMO as the impella's flow is increased.  MD's at bedside are making the adjustments.

## 2021-06-10 NOTE — Progress Notes (Addendum)
[name] [name] for IV Heparin Indication:  ECMO and Impella  No Known Allergies  Patient Measurements: Height: 5\' 3"  (160 cm) Weight: 95.8 kg (211 lb 3.2 oz) IBW/kg (Calculated) : 52.4 Heparin Dosing Weight: 72 kg  Vital Signs: Temp: 97.7 F (36.5 C) (06/20 0722) Temp Source: Core (06/20 0722) Pulse Rate: 80 (06/20 0722)  Labs: Recent Labs    06/08/21 0302 06/08/21 0304 06/09/21 0420 06/09/21 3500 06/09/21 1607 06/09/21 1947 06/03/2021 9381 05/23/2021 0322 05/30/2021 0323 06/06/2021 0515  HGB 8.2*   < > 8.3*   < > 8.0*   < > 17.7* 7.5*  --  6.8*  HCT 24.7*   < > 25.1*   < > 24.5*   < > 52.0* 23.2*  --  20.0*  PLT 57*   < > 71*  --  53*  --   --  44*  --   --   APTT 173*  --  102*  --   --   --   --  108*  --   --   LABPROT 16.7*  --  15.5*  --   --   --   --  15.7*  --   --   INR 1.4*  --  1.2  --   --   --   --  1.3*  --   --   HEPARINUNFRC 0.21*   < > <0.10*  --  0.12*  --   --   --  0.13*  --   CREATININE 1.59*   < > 1.49*  --  1.65*  --   --  1.69*  --   --    < > = values in this interval not displayed.     Estimated Creatinine Clearance: 39 mL/min (A) (by C-G formula based on SCr of 1.69 mg/dL (H)).   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 Cristopher Peru)   Thyroid disease    PROCEDURE FOR THYROID 15 YRS AGO AT [facility]   WPW (Wolff-Parkinson-White syndrome)     Medications:  Infusions:   sodium chloride Stopped (05/27/2021 0640)   sodium chloride     sodium chloride     sodium chloride     sodium chloride     sodium chloride 10 mL/hr at 06/09/2021 0700   sodium chloride     albumin human Stopped (06/09/21 0125)   amiodarone 30 mg/hr (06/03/2021 0700)    ceFAZolin (ANCEF) IV 200 mL/hr at 06/14/2021  0700   dexmedetomidine (PRECEDEX) IV infusion 0.4 mcg/kg/hr (06/18/2021 0700)   epinephrine 7 mcg/min (06/14/2021 0700)   feeding supplement (PIVOT 1.5 CAL) Stopped (06/08/21 1845)   heparin 25 units/mL (Impella PURGE) in dextrose 5 % 1000 mL bag     heparin 200 Units/hr (06/16/2021 0700)   HYDROmorphone 8 mg/hr (05/22/2021 0700)   insulin Stopped (06/09/21 1330)   lactated ringers     lactated ringers 10 mL/hr at 06/09/2021 0700   lactated ringers     midazolam 7 mg/hr (05/23/2021 0700)   milrinone 0.25 mcg/kg/min (06/01/2021 0700)   norepinephrine (LEVOPHED) Adult infusion 7 mcg/min (06/15/2021 0700)    Assessment:  16 yoF admitted with cardiogenic shock 2/2 LM SCAD. Pt  s/p CABG with VA-ECMO and Impella placement. Heparin started systemically and via purge (50 units/ml). Heparin levels elevated on purge alone so concentration reduced to half-strength (25 units/ml). TEG 6/17 demonstrated prolonged R-time with heparin level ~0.16 even with heparinase correlating with underlying hypocoagulable state - thus will target heparin level closer to 0.2.  Ongoing bleeding at cannula site. Heparin level low at 0.13, aPTT remains high at 108. Pltc improved after platelet transfusion but now back down to 44, H/H at 6.8 - one unit this morning. Pt continues to have bleeding so will NOT titrate heparin for now per ECMO team.  Impella purge flow rate is 16.3 mL/hr (408 units/hr), pressures stable in 300-400s, on P-1.   ECMO and impella flow rates were adjusted during rounds to see if able to wean support for possible decannulation - coox 62%, LA 0.9 after adjustments.    Goal of Therapy:  Heparin level 0.2-0.5 - keep on low end for now  Monitor platelets by anticoagulation protocol: Yes   Plan:  -Continue half-strength heparinized purge (25 units/ml) -Continue systemic heparin at 200 units/h -Check heparin level and CBC q12h -F/u for plan for possible decannulation today  Antonietta Jewel, PharmD, [place]  Pharmacist  Phone: [phone] 05/27/2021 7:29 AM  Please check AMION for all [place] phone numbers After 10:00 PM, call [place] [phone]

## 2021-06-10 NOTE — Brief Op Note (Signed)
05/30/2021  3:18 PM  PATIENT:  [name] [name]  60 y.o. female  PRE-OPERATIVE DIAGNOSIS:  ECMO for CARDIOGENIC SHOCK  POST-OPERATIVE DIAGNOSIS:  ECMO for CARDIOGENIC SHOCK  PROCEDURE:  Procedure(s): DECANNULATION FOR ECMO (EXTRACORPOREAL MEMBRANE OXYGENATION) (N/A)  SURGEON:  Surgeon(s) and Role:    * Melrose Nakayama, MD - Primary  PHYSICIAN ASSISTANT:   ASSISTANTS: RNFA   ANESTHESIA:   general  EBL:  350 mL   BLOOD ADMINISTERED: 2 CC PRBC, 2 FFP, and 2 PLTS  DRAINS:  3 Chest Tube(s) in the pleural left and med    LOCAL MEDICATIONS USED:  NONE  SPECIMEN:  No Specimen  DISPOSITION OF SPECIMEN:  N/A  COUNTS:  YES  TOURNIQUET:  * No tourniquets in log *  DICTATION: .Other Dictation: Dictation Number -  PLAN OF CARE:  Already inpatient  PATIENT DISPOSITION:  ICU - intubated and critically ill.   Delay start of Pharmacological VTE agent (>24hrs) due to surgical blood loss or risk of bleeding: hold heparin for 2 hours

## 2021-06-10 NOTE — Progress Notes (Signed)
Patient transported on vent to PU92 without complications. In OR, patient placed on CRNA's vent.  At completion of procedure, patient placed back on RT vent and transported back to 4P32 without complications.  FIO2 titrated to 60%, sats 96%.

## 2021-06-10 NOTE — Progress Notes (Signed)
Patient taken off ECMO at 1442 in [place] 17.  Impella titrtated up to P9 for a flow of 3.5L. Patient tolerating well, ECMO cannulas removed by Dr. Roxan Hockey. Dr. Haroldine Laws present.

## 2021-06-10 NOTE — Progress Notes (Addendum)
ANTICOAGULATION CONSULT NOTE   Pharmacy Consult for IV Heparin Indication:  Impella  No Known Allergies  Patient Measurements: Height: 5\' 3"  (160 cm) Weight: 95.8 kg (211 lb 3.2 oz) IBW/kg (Calculated) : 52.4 Heparin Dosing Weight: 72 kg  Vital Signs: Temp: 97.7 F (36.5 C) (06/20 0722) Temp Source: Core (06/20 1200) BP: 91/82 (06/20 1109) Pulse Rate: 105 (06/20 1109)  Labs: Recent Labs    06/08/21 0302 06/08/21 0304 06/09/21 0420 06/09/21 2297 06/09/21 1607 06/09/21 1947 05/31/2021 0322 05/24/2021 0323 06/11/2021 0515 05/28/2021 0921 05/28/2021 1422 06/11/2021 1425 06/14/2021 1504  HGB 8.2*   < > 8.3*   < > 8.0*   < > 7.5*  --    < > 8.8* 7.1* 8.2* 8.5*  HCT 24.7*   < > 25.1*   < > 24.5*   < > 23.2*  --    < > 26.0* 21.0* 24.0* 25.0*  PLT 57*   < > 71*  --  53*  --  44*  --   --  40*  --   --   --   APTT 173*  --  102*  --   --   --  108*  --   --   --   --   --   --   LABPROT 16.7*  --  15.5*  --   --   --  15.7*  --   --   --   --   --   --   INR 1.4*  --  1.2  --   --   --  1.3*  --   --   --   --   --   --   HEPARINUNFRC 0.21*   < > <0.10*  --  0.12*  --   --  0.13*  --   --   --   --   --   CREATININE 1.59*   < > 1.49*  --  1.65*  --  1.69*  --   --   --  1.70*  --   --    < > = values in this interval not displayed.     Estimated Creatinine Clearance: 38.8 mL/min (A) (by C-G formula based on SCr of 1.7 mg/dL (H)).   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 Cristopher Peru)   Thyroid disease    PROCEDURE FOR THYROID 15 YRS AGO AT [facility]   WPW (Wolff-Parkinson-White syndrome)     Medications:  Infusions:   sodium chloride Stopped (06/19/2021 0918)   sodium chloride     sodium chloride     sodium chloride     sodium chloride      sodium chloride 10 mL/hr at 06/15/2021 0900   sodium chloride     sodium chloride     albumin human Stopped (06/09/21 0125)   amiodarone 30 mg/hr (06/19/2021 1335)    ceFAZolin (ANCEF) IV Stopped (06/15/2021 9892)   dexmedetomidine (PRECEDEX) IV infusion 0.4 mcg/kg/hr (06/14/2021 1335)   epinephrine 3 mcg/min (05/30/2021 1500)   feeding supplement (PIVOT 1.5 CAL) Stopped (06/08/21 1845)   heparin 25 units/mL (Impella PURGE) in dextrose 5 % 1000 mL bag     heparin Stopped (06/18/2021 1420)  HYDROmorphone 8 mg/hr (05/30/2021 1200)   lactated ringers     lactated ringers 10 mL/hr at 05/30/2021 1335   lactated ringers     midazolam 7 mg/hr (06/03/2021 1335)   milrinone 0.25 mcg/kg/min (06/01/2021 1335)   norepinephrine (LEVOPHED) Adult infusion Stopped (05/24/2021 1500)   vasopressin 0.02 Units/min (06/18/2021 1335)    Assessment:  28 yoF admitted with cardiogenic shock 2/2 LM SCAD. Pt s/p CABG with VA-ECMO and Impella placement.   Now decannulated on 6/20. Impella CP purge flow rate at 17.1 mL/hr (427 units/hr). Pressures in 400s. Running at P9. Slight oozing at dressing near impella site - but otherwise no other s/sx of bleeding post-decannulation.    Goal of Therapy:  Heparin level 0.2-0.5  Monitor platelets by anticoagulation protocol: Yes   Plan:  -Continue half-strength heparinized purge (25 units/ml) -Check heparin level and CBC q12h -F/u plan for systemic heparin in AM   Antonietta Jewel, PharmD, [place] Pharmacist  Phone: [phone] 06/20/2021 3:46 PM  Please check AMION for all [place] phone numbers After 10:00 PM, call [place] [phone]

## 2021-06-10 NOTE — Progress Notes (Addendum)
ECMO Sweep Trial   A sweep trial was started at 0748  per Dr. Haroldine Laws.  Vent settings:  FiO2:  30% PEEP:  5.0  The following trial parameters have been discussed:  pH > 7.38, pCO2 < 40.8, pO2 > 379, SpO2 > 100  Sweep trial passed, and oxygen was returned to the circuit at a sweep of 3 LPM.  ECMO physician notified.  ABG ABG - Last 2 Results Latest Ref Rng & Units 06/16/2021 06/06/2021  PH ART 7.350 - 7.450 7.490(H) 7.399  PCO2 ART 32.0 - 48.0 mmHg 30.4(L) 35.9  PO2 ART 83.0 - 108.0 mmHg 331(H) 67(L)  BICARBONATE 20.0 - 28.0 mmol/L 23.1 22.2  ACID-BASE EXCESS 0.0 - 2.0 mmol/L 0.0 -  ACID-BASE DEFICIT 0.0 - 2.0 mmol/L - 2.0  O2 SAT % 100.0 93.0  Some recent data might be hidden

## 2021-06-10 NOTE — Transfer of Care (Signed)
Immediate Anesthesia Transfer of Care Note  Patient: [name] [name]  Procedure(s) Performed: Geni Bers FOR ECMO (EXTRACORPOREAL MEMBRANE OXYGENATION)  Patient Location: SICU  Anesthesia Type:General  Level of Consciousness: sedated, unresponsive and Patient remains intubated per anesthesia plan  Airway & Oxygen Therapy: Patient remains intubated per anesthesia plan and Patient placed on Ventilator (see vital sign flow sheet for setting)  Post-op Assessment: Report given to RN and Post -op Vital signs reviewed and stable  Post vital signs: Reviewed and stable  Last Vitals:  Vitals Value Taken Time  BP    Temp 35.7 C 05/26/2021 1553  Pulse 87 05/26/2021 1553  Resp    SpO2 97 % 06/15/2021 1553  Vitals shown include unvalidated device data.  Last Pain:  Vitals:   05/30/2021 1200  TempSrc: Core  PainSc: 0-No pain      Patients Stated Pain Goal: 0 (46/95/07 2257)  Complications: No notable events documented.

## 2021-06-10 NOTE — Progress Notes (Signed)
Patient ID: STEVE GREGG, female   DOB: 1961-08-21, 60 y.o.   MRN: [id number] TCTS Evening Rounds:  Decannulated from ECMO today. Impella at P7 Milrinone 0.25, epi 3, Vaso 0.02, NE to support BP.  CI 1.4  Chest tube output decreasing.   UO 30/hr.  Remains sedated on vent with open sternum.  ABG ok Hgb 10.5 K 4.8 iCa 1.23

## 2021-06-10 NOTE — Progress Notes (Signed)
Per Dr. Haroldine Laws - ECMO wean trial ended.  Impella changed to P3 by RN giving a flow on 1.6 L and the Cardiohelp increased to 2500 rpms giving a flow of 2.17 L.

## 2021-06-10 NOTE — Progress Notes (Signed)
ECMO patient transported from 2H23 to OR 17 without complications by ECMO Specialist Robbie Lis, RRT and Idelia Salm, RN, ECMO Coordinator.  In OR, ECMO oxygen circuit connected to wall O2 outlet, and power plugged in to red wall outlet.

## 2021-06-10 NOTE — Anesthesia Postprocedure Evaluation (Signed)
Anesthesia Post Note  Patient: [name] [name]  Procedure(s) Performed: DECANNULATION FOR ECMO (EXTRACORPOREAL MEMBRANE OXYGENATION)     Patient location during evaluation: SICU Anesthesia Type: General Level of consciousness: sedated and patient remains intubated per anesthesia plan Pain management: pain level controlled Vital Signs Assessment: post-procedure vital signs reviewed and stable Respiratory status: patient remains intubated per anesthesia plan and patient on ventilator - see flowsheet for VS Cardiovascular status: stable Anesthetic complications: no   No notable events documented.  Last Vitals:  Vitals:   06/18/2021 1920 06/02/2021 1930  BP:    Pulse: 87 87  Resp: 18   Temp: (!) 36.1 C (!) 36.1 C  SpO2: 98% 98%    Last Pain:  Vitals:   06/06/2021 1920  TempSrc: Core  PainSc:                  Ruthel Martine COKER

## 2021-06-10 NOTE — Anesthesia Preprocedure Evaluation (Signed)
Anesthesia Evaluation  Patient identified by MRN, date of birth, ID band Patient awake    Reviewed: Allergy & Precautions, Patient's Chart, lab work & pertinent test results  Airway Mallampati: Intubated       Dental   Pulmonary Current Smoker and Patient abstained from smoking.,    + rhonchi  + decreased breath sounds      Cardiovascular hypertension,  Rhythm:Regular Rate:Normal     Neuro/Psych    GI/Hepatic   Endo/Other  diabetes  Renal/GU      Musculoskeletal   Abdominal   Peds  Hematology   Anesthesia Other Findings   Reproductive/Obstetrics                             Anesthesia Physical Anesthesia Plan  ASA: 3  Anesthesia Plan: General   Post-op Pain Management:    Induction: Intravenous  PONV Risk Score and Plan: Ondansetron and Dexamethasone  Airway Management Planned: Oral ETT  Additional Equipment: Arterial line, PA Cath and 3D TEE  Intra-op Plan:   Post-operative Plan: Post-operative intubation/ventilation  Informed Consent: I have reviewed the patients History and Physical, chart, labs and discussed the procedure including the risks, benefits and alternatives for the proposed anesthesia with the patient or authorized representative who has indicated his/her understanding and acceptance.       Plan Discussed with: CRNA and Anesthesiologist  Anesthesia Plan Comments:         Anesthesia Quick Evaluation

## 2021-06-10 NOTE — Progress Notes (Signed)
Daily Progress Note   Patient Name: [name] [name]       Date: 05/23/2021 DOB: 1961/09/30  Age: 60 y.o. MRN#: [id number] Attending Physician: Melrose Nakayama, MD Primary Care Physician: Lucianne Lei, MD Admit Date: 06/11/2021  Reason for Consultation/Follow-up: Establishing goals of care  Subjective: Patient continues sedation, was following commands this weekend.  Did well with trial of impella support only.  Plan for today is to decanulate from ECMO and close chest.  I called Orvilla Cornwall for support.  Inez Catalina is aware that patient remains critically ill but decannulation from ECMO is a positive step.  She is in process of discussion "what ifs" with Tonia Ghent- patient's mother and legal surrogate decision maker. They continue to hope for the best- but are preparing for the worst outcomes as well.   Review of Systems  Unable to perform ROS: Intubated   Length of Stay: 5  Current Medications: Scheduled Meds:  . [MAR Hold] sodium chloride   Intravenous Once  . [MAR Hold] sodium chloride   Intravenous Once  . [MAR Hold] sodium chloride   Intravenous Once  . [MAR Hold] sodium chloride   Intravenous Once  . [MAR Hold] sodium chloride   Intravenous Once  . [MAR Hold] acetaminophen (TYLENOL) oral liquid 160 mg/5 mL  1,000 mg Per Tube Q6H  . [MAR Hold] aspirin  324 mg Per Tube Daily  . [MAR Hold] chlorhexidine gluconate (MEDLINE KIT)  15 mL Mouth Rinse BID  . [MAR Hold] Chlorhexidine Gluconate Cloth  6 each Topical Daily  . [MAR Hold] docusate  200 mg Per Tube Daily  . heparin-papaverine-plasmalyte irrigation   Irrigation To OR  . [MAR Hold] insulin aspart  1-3 Units Subcutaneous Q4H  . [MAR Hold] insulin detemir  5 Units Subcutaneous Q12H  . [MAR Hold] mouth rinse  15 mL Mouth Rinse 10 times  per day  . [MAR Hold] pantoprazole sodium  40 mg Per Tube Daily  . [MAR Hold] rosuvastatin  40 mg Per Tube q1800  . [MAR Hold] sennosides  10 mL Per Tube Daily  . [MAR Hold] sodium chloride flush  10-40 mL Intracatheter Q12H  . [MAR Hold] sodium chloride flush  3 mL Intravenous Q12H    Continuous Infusions: . sodium chloride Stopped (05/31/2021 0918)  . sodium chloride    .  sodium chloride    . sodium chloride    . sodium chloride    . sodium chloride 10 mL/hr at 06/02/2021 0900  . [MAR Hold] sodium chloride    . [MAR Hold] sodium chloride    . [MAR Hold] albumin human Stopped (06/09/21 0125)  . amiodarone 30 mg/hr (06/19/2021 1335)  . [MAR Hold]  ceFAZolin (ANCEF) IV Stopped (06/03/2021 7858)  . dexmedetomidine (PRECEDEX) IV infusion 0.4 mcg/kg/hr (05/22/2021 1335)  . [MAR Hold] epinephrine 3 mcg/min (06/06/2021 1500)  . feeding supplement (PIVOT 1.5 CAL) Stopped (06/08/21 1845)  . heparin 25 units/mL (Impella PURGE) in dextrose 5 % 1000 mL bag    . heparin Stopped (06/18/2021 1420)  . HYDROmorphone 8 mg/hr (05/29/2021 1200)  . [MAR Hold] lactated ringers    . lactated ringers 10 mL/hr at 05/25/2021 1335  . lactated ringers    . midazolam 7 mg/hr (06/11/2021 1335)  . milrinone 0.25 mcg/kg/min (06/03/2021 1335)  . [MAR Hold] norepinephrine (LEVOPHED) Adult infusion Stopped (06/15/2021 1500)  . vasopressin 0.02 Units/min (06/02/2021 1335)    PRN Meds: sodium chloride, [MAR Hold] sodium chloride, [MAR Hold] sodium chloride, [MAR Hold] albumin human, hemostatic agents, heparin-papaverine-plasmalyte irrigation, [MAR Hold] HYDROmorphone, [MAR Hold] ipratropium-albuterol, [MAR Hold] lactated ringers, [MAR Hold] metoprolol tartrate, [MAR Hold] midazolam, [MAR Hold] ondansetron (ZOFRAN) IV, [MAR Hold] sodium chloride flush, [MAR Hold] sodium chloride flush, sodium chloride irrigation, Surgifoam 1 Gm with 0.9% sodium chloride (4 ml) topical solution  Physical Exam Nursing note reviewed.  Pulmonary:      Comments: intubated Neurological:     Comments: sedated            Vital Signs: BP 91/82   Pulse (!) 105   Temp 97.7 F (36.5 C) (Core)   Resp 18   Ht '5\' 3"'  (1.6 m)   Wt 95.8 kg   SpO2 100%   BMI 37.41 kg/m  SpO2: SpO2: 100 % O2 Device: O2 Device: Ventilator O2 Flow Rate: O2 Flow Rate (L/min): 2 L/min  Intake/output summary:  Intake/Output Summary (Last 24 hours) at 06/06/2021 1532 Last data filed at 05/22/2021 1508 Gross per 24 hour  Intake 5256.02 ml  Output 3170 ml  Net 2086.02 ml   LBM: Last BM Date: 06/03/21 Baseline Weight: Weight: 87.1 kg Most recent weight: Weight: 95.8 kg       Palliative Assessment/Data:      Patient Active Problem List   Diagnosis Date Noted  . S/P CABG x 2 06/03/2021  . Left main coronary artery disease 06/03/2021  . Non-ST elevation (NSTEMI) myocardial infarction ([place])   . Encounter for screening colonoscopy for non-high-risk patient 07/04/2019  . Polypharmacy 07/04/2019  . Severe obesity (BMI >= 40) ([place]) 07/04/2019  . Left hemiparesis ([place]) 11/17/2016  . Idiopathic hypotension   . Cognitive deficit, post-stroke   . CVA (cerebral vascular accident) ([place]) 10/29/2016  . Stroke due to embolism of middle cerebral artery ([place]) 10/29/2016  . Seizure prophylaxis   . History of supraventricular tachycardia   . Neurologic abnormality   . Stroke syndrome   . Vascular headache   . Other secondary hypertension   . Hypokalemia   . AKI (acute kidney injury) ([place])   . Lymphocytosis   . Lethargy   . Brain edema ([place])   . Intracranial aneurysm 07/09/2016  . Communicating hydrocephalus ([place]) 02/03/2016  . Essential hypertension   . Paroxysmal SVT (supraventricular tachycardia) ([place])   . Paroxysmal atrial fibrillation (H[facility])   . Tobacco abuse   . Tachypnea   .  Bradycardia   . Leukocytosis   . Acute blood loss anemia   . Thrombocytosis   . Chest pain   . Nontraumatic subarachnoid hemorrhage ([place])   . Acute respiratory failure ([place])    . Encounter for central line placement   . Hypoxemia   . Renal insufficiency   . Encounter for imaging study to confirm nasogastric (NG) tube placement   . Endotracheally intubated   . Respiratory failure ([place])   . SAH (subarachnoid hemorrhage) ([place])   . Subarachnoid hemorrhage due to ruptured aneurysm ([place]) 01/03/2016  . Subarachnoid hemorrhage ([place]) 01/03/2016  . Atrial fibrillation with rapid ventricular response ([place])   . Elevated lactic acid level   . SMOKER 03/20/2009  . HYPERTENSION, UNSPECIFIED 03/20/2009  . WPW 03/20/2009  . SVT/ PSVT/ PAT 03/20/2009  . BRONCHITIS 03/20/2009    Palliative Care Assessment & Plan   Patient Profile: 60 y.o. female  with past medical history of brain aneurysm, HTN, SVT s/p ablation, HTN, hypothyroidism admitted on 05/29/2021 with chest pain. Cardiac cath revealed SCAD with no distal flow on second injection, during procedure she complained of crushing chest pain, hypotension, suffered cardiogenic shock was found with STEMI- had Impella placed, and taken for emergent CABG. After CABG was not able to wean from the Impella and therefore was cannulated and VA ECMO started. She remains intubated, moving extremities at times, EEG shows significant encephalopathy. Palliative medicine consulted for "ECMO".    Assessment/Recommendations/Plan  Continue current scope PMT will continue to follow and provide support  Goals of Care and Additional Recommendations: Limitations on Scope of Treatment: Full Scope Treatment  Code Status: Limited code - no chest compressions  Prognosis:  Unable to determine  Discharge Planning: To Be Determined  Care plan was discussed with patient's care team and niece- Orvilla Cornwall.   Thank you for allowing the Palliative Medicine Team to assist in the care of this patient.   Total time: 39 mins  Greater than 50%  of this time was spent counseling and coordinating care related to the above assessment and plan.  Mariana Kaufman, AGNP-C Palliative Medicine   Please contact Palliative Medicine Team phone at [phone] for questions and concerns.

## 2021-06-10 NOTE — Progress Notes (Signed)
ECMO NOTE:   Indication: Cardiogenic shock    Initial cannulation date: 05/23/2021   ECMO type: VA ECMO   Cannulation:   Central cannulation Asc Ao & RA   ECMO events:   - Initial cannulation 06/14/2021   Daily data:   Flow 3.4L RPM 2850 Sweep  3.0 L   Labs:   ABG    Component Value Date/Time   PHART 7.387 06/03/2021 0515   PCO2ART 40.8 05/28/2021 0515   PO2ART 379 (H) 06/06/2021 0515   HCO3 24.7 06/09/2021 0515   TCO2 26 06/09/2021 0515   ACIDBASEDEF 2.0 05/24/2021 0312   O2SAT 100.0 06/15/2021 0515    Hgb 8.3 -> 7.5 Platelets 50 -> 58 -> 71K -> 44K LDH  1876 -> 980 -> 580  -> 450 -> 435 Heparin level: < 0.13    Plan:  - Will attempt to wean VA ECMO to Impella today and discuss with TCTS. If unable to wean, likely need chest washout - Will bump heparin up with slowing of VA ECMP - Transfuse to keep Hgb >8.0 - Watch platelets.   Glori Bickers, MD  8:04 AM

## 2021-06-10 NOTE — Progress Notes (Signed)
ECMO PROGRESS NOTE  NAME:  [name] [name], MRN:  [id number], DOB:  09/03/1961, LOS: 5 ADMISSION DATE:  06/15/2021, CONSULTATION DATE: 05/23/2021 REFERRING MD: Roxan Hockey, CHIEF COMPLAINT: VA ECMO  HPI/course in hospital   60 year old woman with hx of HTN, HLD, hypothyroidism, CVA, DM2 who presented with angina.  Underwent cardiac cath which showed left main dissection, during this procedure became acutely unstable with crushing chest pain and shock.  Taken emergently to OR for bypass after 3-5 impella placed.  Tough to wean from bypass so left centrally cannulated on VA ECMO and sent to [facility].  PCCM consulted to help with management.  Past Medical History   Past Medical History:  Diagnosis Date   Arthritis    Brain aneurysm    Diabetes mellitus without complication (HCC)    Headache    Hypertension        SVT (supraventricular tachycardia) ([place])    s/p radiofrequency catheter ablation for AVNRT 02/16/09 (Dr. Cristopher Peru)   Thyroid disease    PROCEDURE FOR THYROID 15 YRS AGO AT [facility]   WPW (Wolff-Parkinson-White syndrome)      Past Surgical History:  Procedure Laterality Date   BREAST SURGERY     BX RT BREAST  BENIGN   CORONARY ARTERY BYPASS GRAFT N/A 06/14/2021   Procedure: CORONARY ARTERY BYPASS GRAFTING (CABG)X2.USING  LEFT INTERNAL MAMMARY ARTERY AND  RIGHT ENDOSCOPIC SAPHENOUS VEIN HARVESTING. ECMO INSERTION;  Surgeon: Melrose Nakayama, MD;  Location: [facility] [facility];  Service: Open Heart Surgery;  Laterality: N/A;   CRANIOTOMY N/A 01/04/2016   Procedure: Suboccipital Craniotomy and Cervical one Laminectomy for Clipping of Aneurysm;  Surgeon: Kevan Ny Ditty, MD;  Location: [place] [facility];  Service: Neurosurgery;  Laterality: N/A;   CRANIOTOMY N/A 07/09/2016   Procedure: Craniotomy for clipping of left middle cerebral artery aneurysm;  Surgeon: Kevan Ny Ditty, MD;  Location: [place] [facility];  Service: Neurosurgery;  Laterality: N/A;  Craniotomy for clipping of left middle cerebral  artery aneurysm   CRANIOTOMY Right 10/24/2016   Procedure: 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 Christella Noa;  Surgeon: Kevan Ny Ditty, MD;  Location: [facility];  Service: Neurosurgery;  Laterality: Right;   ELECTROPHYSIOLOGIC STUDY     LAPAROSCOPIC REVISION VENTRICULAR-PERITONEAL (V-P) SHUNT N/A 02/04/2016   Procedure: LAPAROSCOPIC Insertion VENTRICULAR-PERITONEAL (V-P) SHUNT;  Surgeon: Rolm Bookbinder, MD;  Location: [place] [facility]S;  Service: General;  Laterality: N/A;   LEFT HEART CATH AND CORONARY ANGIOGRAPHY N/A 06/02/2021   Procedure: LEFT HEART CATH AND CORONARY ANGIOGRAPHY;  Surgeon: Jettie Booze, MD;  Location: [facility] CV [facility];  Service: Cardiovascular;  Laterality: N/A;   RADIOLOGY WITH ANESTHESIA N/A 01/03/2016   Procedure: RADIOLOGY WITH ANESTHESIA;  Surgeon: Medication Radiologist, MD;  Location: [facility];  Service: Radiology;  Laterality: N/A;   TEE WITHOUT CARDIOVERSION N/A 05/22/2021   Procedure: TRANSESOPHAGEAL ECHOCARDIOGRAM (TEE);  Surgeon: Melrose Nakayama, MD;  Location: [facility];  Service: Open Heart Surgery;  Laterality: N/A;   VENTRICULAR ASSIST DEVICE INSERTION N/A 06/03/2021   Procedure: VENTRICULAR ASSIST DEVICE INSERTION;  Surgeon: Jettie Booze, MD;  Location: [facility] CV [facility];  Service: Cardiovascular;  Laterality: N/A;   VENTRICULOPERITONEAL SHUNT Right 02/04/2016   Procedure: SHUNT INSERTION VENTRICULAR-PERITONEAL With Laparoscopic Assistance;  Surgeon: Kevan Ny Ditty, MD;  Location: [place] [facility];  Service: Neurosurgery;  Laterality: Right;     Significant Hospital events:  6/14 admitted 6/15 spontaneous artery dissection left main on cath.CP Impella  placed.   6/15 emergent coronary artery bypass graft, LIMA to LAD, SVG to OM.  Brief cardiac arrest of less than 10 minutes immediately prior to initiation of cardiopulmonary bypass. 6/15 unable to wean.  Still unable to wean from bypass so placed on centrally  cannulated VA ECMO. 6/16 EEG shows encephalopathy.  6/16 Cold right hand - seen by vascular, conservative management 6/17 Hand improved. 758m clot removed with washout at the bedside  6/19 RUL collapse - underwent bronchoscopy and aspiration of RUL mucus plug.   Interim history/subjective:   Continues to ooze from cannula sites. Increased sedation requirements overnight.  Still high OGT drainage.   Objective   Blood pressure (!) 81/69, pulse 80, temperature 97.7 F (36.5 C), temperature source Core, resp. rate 18, height _0  (1.6 m), weight 95.8 kg, SpO2 100 %. PAP: (20-32)/(11-15) 22/12 CVP:  [7 mmHg] 7 mmHg  Vent Mode: PRVC FiO2 (%):  [30 %] 30 % Set Rate:  [15 bmp] 15 bmp Vt Set:  [310 mL-410 mL] 410 mL PEEP:  [10 cmH20] 10 cmH20 Plateau Pressure:  [20 cmH20-23 cmH20] 20 cmH20   Intake/Output Summary (Last 24 hours) at 05/24/2021 0816 Last data filed at 05/24/2021 0730 Gross per 24 hour  Intake 3848.41 ml  Output 3180 ml  Net 668.41 ml    Filed Weights   06/08/21 0500 06/09/21 0443 06/02/2021 0500  Weight: 94.9 kg 97.3 kg 95.8 kg    ECMO Device: Cardiohelp  ECMO Mode: VA  Flow (LPM): 3.42   Physical Exam Constitutional:      Appearance: She is obese.  HENT:     Head: Normocephalic.  Eyes:     Comments: sclerae intact Neck:     Comments: ET tube and OG tube in place Cardiovascular:     Rate and Rhythm: Normal rate and regular rhythm.     Heart sounds: Normal heart sounds.     Comments: Open chest with central ECMO cannulas, Dacron spacer is concave.  All extremities warm. Mild generalized edema. Pulmonary:     Breath sounds: Normal vesicular breath sounds with diffuse rhonchi    Comments: Mechanically ventilated with acceptable airway pressures.  Abdominal:     Palpations: Abdomen is soft.  Minimal OGT output.  Skin:    General: Skin is warm and dry.     Capillary Refill: Capillary refill takes 2 to 3 seconds.  Neurological:     Comments: Sedated.  No  movement to voice or spontaneously, RASS -4  Ancillary tests (personally reviewed)  CBC: Recent Labs  Lab 06/08/21 1613 06/08/21 1614 06/09/21 0017 06/09/21 0019 06/09/21 0420 06/09/21 0440306/19/22 1607 06/09/21 1947 05/26/2021 0312 06/02/2021 0322 06/14/2021 0515  WBC 7.9  --  6.7  --  6.9  --  7.7  --   --  8.4  --   HGB 10.1*   < > 8.7*   < > 8.3*   < > 8.0* 8.2* 17.7* 7.5* 6.8*  HCT 29.8*   < > 25.8*   < > 25.1*   < > 24.5* 24.0* 52.0* 23.2* 20.0*  MCV 87.6  --  87.5  --  88.4  --  91.1  --   --  93.2  --   PLT 38*  --  72*  --  71*  --  53*  --   --  44*  --    < > = values in this interval not displayed.     Basic Metabolic Panel: Recent Labs  Lab 05/23/2021 0450 06/11/2021 1506 06/08/2021 2055 06/06/21 0012 06/06/21 0402 06/06/21 0605 06/06/21 1546 06/06/21 1557 06/08/21 0302 06/08/21 0304 06/08/21 1613 06/08/21 1614 06/09/21 0420 06/09/21 2947 06/09/21 1607 06/09/21 1947 06/03/2021 0312 06/18/2021 0322 06/11/2021 0515  NA 139   < > 144  142   < > 139   < > 141   < > 139   < > 138   < > 139   < > 138 139 139 138 139  K 3.8   < > 3.1*  2.9*   < > 3.4*   < > 3.7   < > 5.0   < > 5.0   < > 4.9   < > 5.2* 4.8 5.2* 5.3* 5.1  CL 110   < > 112*  --  112*  --  113*   < > 115*  --  114*  --  111  --  112*  --   --  111  --   CO2 23  --  17*  --  17*  --  20*   < > 21*  --  22  --  21*  --  22  --   --  23  --   GLUCOSE 81   < > 207*  --  251*  --  178*   < > 150*  --  152*  --  117*  --  145*  --   --  134*  --   BUN 16   < > 13  --  14  --  14   < > 14  --  13  --  12  --  12  --   --  14  --   CREATININE 1.50*   < > 1.76*  --  1.91*  --  1.90*   < > 1.59*  --  1.51*  --  1.49*  --  1.65*  --   --  1.69*  --   CALCIUM 8.9  --  7.4*  --  7.3*  --  7.4*   < > 7.2*  --  7.4*  --  7.6*  --  7.5*  --   --  7.7*  --   MG 1.9  --  1.8  --  2.6*  --  2.0  --   --   --   --   --   --   --   --   --   --   --   --    < > = values in this interval not displayed.    GFR: Estimated  Creatinine Clearance: 39 mL/min (A) (by C-G formula based on SCr of 1.69 mg/dL (H)). Recent Labs  Lab 06/09/2021 2055 06/06/21 0402 06/06/21 1810 06/16/2021 0232 06/06/2021 0919 06/09/21 0017 06/09/21 0420 06/09/21 1607 06/09/2021 0322 05/26/2021 0323  WBC 11.3* 9.9   < > 7.8   < > 6.7 6.9 7.7 8.4  --   LATICACIDVEN 7.5* 5.1*  --  1.2  --   --   --   --   --  0.7   < > = values in this interval not displayed.     Liver Function Tests: Recent Labs  Lab 06/06/21 0402 06/08/2021 0232 06/08/21 0302 06/09/21 0420 05/25/2021 0322  AST 771* 212* 87* 56* 50*  ALT 221* 53* _0 ALKPHOS 26* 22* 28* 39 45  BILITOT 0.7 0.7 1.1 0.7 0.6  PROT 3.8* 4.2* 3.7* 4.0*  4.1*  ALBUMIN 2.6* 3.2* 2.4* 2.4* 2.1*    No results for input(s): LIPASE, AMYLASE in the last 168 hours. No results for input(s): AMMONIA in the last 168 hours.  ABG    Component Value Date/Time   PHART 7.387 05/25/2021 0515   PCO2ART 40.8 05/26/2021 0515   PO2ART 379 (H) 06/01/2021 0515   HCO3 24.7 06/03/2021 0515   TCO2 26 05/23/2021 0515   ACIDBASEDEF 2.0 06/03/2021 0312   O2SAT 100.0 06/08/2021 0515      Coagulation Profile: Recent Labs  Lab 06/06/21 0402 06/16/2021 0232 06/08/21 0302 06/09/21 0420 06/01/2021 0322  INR 1.7* 1.5* 1.4* 1.2 1.3*     Cardiac Enzymes: No results for input(s): CKTOTAL, CKMB, CKMBINDEX, TROPONINI in the last 168 hours.  HbA1C: Hgb A1c MFr Bld  Date/Time Value Ref Range Status  05/30/2021 10:03 AM 5.4 4.8 - 5.6 % Final    Comment:    (NOTE)         Prediabetes: 5.7 - 6.4         Diabetes: >6.4         Glycemic control for adults with diabetes: <7.0     CBG: Recent Labs  Lab 06/09/21 1124 06/09/21 1610 06/09/21 1943 06/09/21 2358 05/29/2021 0310  GLUCAP 127* 143* 114* 120* 113*   CXR: show RUL collapse (personal review) - now resolved following bronchoscopy  Assessment & Plan:  Critically ill due to cardiogenic shock secondary to spontaneous coronary dissection of the  left main requiring VA ECMO support and titration of vasopressors and inotropes. CP Impella to offload LV. Status post two-vessel bypass Ongoing slow bleeding due to platelet dysfunction.  RUL collapse AKI due to hypotension - creatinine improving.  Shock liver due to hypotension - improving Multifactorial thrombocytopenia - improving Status post PEA cardiac arrest Uncontrolled type 2 diabetes  Plan:  -Titrate norepinephrine to keep MAP greater than 65 - stable NE requirement. Sill vasoplegic following CPB run and ECMO.  May require vasopressor increase to permit ECMO wean, but will try to maintain these doses constant during weaning.  -Continue epinephrine and milrinone at current doses. -Gradual crossover to Impella.  If tolerates, consider decannulation and chest closure today. -Increase ventilation to maintain normal MV to facilitate ECMO weaning. -Diurese today - tolerating fluid removal well.  - Cortrak today - at high nutritional risk.   Daily Goals Checklist  Pain/Anxiety/Delirium protocol (if indicated): Dilaudid, Precedex and Versed to keep RASS goal -4.  VAP protocol (if indicated): Bundle in place Respiratory support goals: Rest ventilator settings.  No weaning at this time. Blood pressure target: MAP greater than 65, normal cardiac output. DVT prophylaxis: On systemic heparin for VA ECMO - maintain higher level of anticoagulation during weaning.  Nutritional status and feeding goals: Post pyloric tube tomorrow. GI prophylaxis: Pantoprazole.  Fluid status goals: No diuresis today  Urinary catheter: Assessment of intravascular volume Central lines: Right IJ MAC introducer, left radial art line Glucose control: Uncontrolled type 2 diabetes on insulin infusion, transition to [place] insulin.  Mobility/therapy needs: Bedrest Antibiotic de-escalation: Perioperative antibiotics Home medication reconciliation: On hold Daily labs: Per ECMO protocol Code Status: Full code Family  Communication: Family to be updated over the phone.  Disposition: ICU  CRITICAL CARE Performed by: Kipp Brood  Total critical care time: 40 minutes  Critical care time was exclusive of separately billable procedures and treating other patients.  Critical care was necessary to treat or prevent imminent or life-threatening deterioration.  Critical care was time spent personally  by me on the following activities: development of treatment plan with patient and/or surrogate as well as nursing, discussions with consultants, evaluation of patient's response to treatment, examination of patient, obtaining history from patient or surrogate, ordering and performing treatments and interventions, ordering and review of laboratory studies, ordering and review of radiographic studies, pulse oximetry, re-evaluation of patient's condition and participation in multidisciplinary rounds.  Kipp Brood, MD [facility] ICU Physician [place]  Pager: [phone] Mobile: [phone] After hours: [phone].   06/15/2021, 8:16 AM

## 2021-06-10 NOTE — Progress Notes (Signed)
Flows decreased as the vent support was increased. MD's at beside increasing the impella flow.

## 2021-06-10 NOTE — Interval H&P Note (Signed)
History and Physical Interval Note:   I spoke with DR. Bensimhon and Dr. Kathrynn Ducking. We are all in agreement that she likely can wean form ECMO today. Will take to OR this afternoon to attempt to wean completely and decannulate.   I spoke to her sister Clois Dupes to obtain consent. She understands this is still a high risk situation with no guarantee as to the outcome.   06/06/2021 1:41 PM  Almyra Free  has presented today for surgery, with the diagnosis of ECMO.  The various methods of treatment have been discussed with the patient and family. After consideration of risks, benefits and other options for treatment, the patient has consented to  Procedure(s): DECANNULATION FOR ECMO (EXTRACORPOREAL MEMBRANE OXYGENATION) (N/A) as a surgical intervention.  The patient's history has been reviewed, patient examined, no change in status, stable for surgery.  I have reviewed the patient's chart and labs.  Questions were answered to the patient's satisfaction.     Melrose Nakayama

## 2021-06-10 NOTE — H&P (View-Only) (Signed)
3 Days Post-Op Procedure(s) (LRB): Reexploration and evacuation of hematoma (N/A) Subjective: Intubated, sedated  Objective: Vital signs in last 24 hours: Temp:  [97.52 F (36.4 C)-97.9 F (36.6 C)] 97.7 F (36.5 C) (06/20 0722) Pulse Rate:  [78-93] 80 (06/20 0738) Cardiac Rhythm: Normal sinus rhythm (06/20 0400) Resp:  [15-18] 18 (06/20 0738) BP: (81)/(69) 81/69 (06/20 0738) SpO2:  [100 %] 100 % (06/20 0738) Arterial Line BP: (73-98)/(61-76) 87/72 (06/20 0722) FiO2 (%):  [30 %] 30 % (06/20 0738) Weight:  [95.8 kg] 95.8 kg (06/20 0500)  Hemodynamic parameters for last 24 hours: PAP: (20-32)/(11-15) 22/12 CVP:  [7 mmHg] 7 mmHg  Intake/Output from previous day: 06/19 0701 - 06/20 0700 In: 3656.6 [I.V.:2445.8; NG/GT:510; IV Piggyback:309.4] Out: 2990 [Urine:1910; Emesis/NG output:600; Chest Tube:480] Intake/Output this shift: Total I/O In: 315 [Blood:315] Out: 300 [Urine:300]  General appearance: intubated, sedated Neurologic: sedated, has moved all ext Heart: regular rate and rhythm Lungs: coarse BS bilat Wound: esmark in place, some oozing  Lab Results: Recent Labs    06/09/21 1607 06/09/21 1947 06/02/2021 0322 06/11/2021 0515  WBC 7.7  --  8.4  --   HGB 8.0*   < > 7.5* 6.8*  HCT 24.5*   < > 23.2* 20.0*  PLT 53*  --  44*  --    < > = values in this interval not displayed.   BMET:  Recent Labs    06/09/21 1607 06/09/21 1947 05/31/2021 0322 05/26/2021 0515  NA 138   < > 138 139  K 5.2*   < > 5.3* 5.1  CL 112*  --  111  --   CO2 22  --  23  --   GLUCOSE 145*  --  134*  --   BUN 12  --  14  --   CREATININE 1.65*  --  1.69*  --   CALCIUM 7.5*  --  7.7*  --    < > = values in this interval not displayed.    PT/INR:  Recent Labs    05/29/2021 0322  LABPROT 15.7*  INR 1.3*   ABG    Component Value Date/Time   PHART 7.387 06/15/2021 0515   HCO3 24.7 05/31/2021 0515   TCO2 26 05/25/2021 0515   ACIDBASEDEF 2.0 06/18/2021 0312   O2SAT 100.0 06/08/2021 0515    CBG (last 3)  Recent Labs    06/09/21 1943 06/09/21 2358 06/11/2021 0310  GLUCAP 114* 120* 113*    Assessment/Plan: S/P Procedure(s) (LRB): Reexploration and evacuation of hematoma (N/A) - Remains critically ill Rounded with Dr. Haroldine Laws and [place]- will decrease ECMO flow and increase Impella and monitor Anemia- transfuse Renal function stable VDRF vent per CCM, obviously no wean at present   LOS: 5 days    Melrose Nakayama 06/16/2021

## 2021-06-10 NOTE — Progress Notes (Signed)
   I was in the OR earlier today for chest washout and ECMO decannulation.  Now back in ICU  Remains intubated/sedated.   On Impella at  P-6 (turned down due to suction)  I repeated thermodilution numbers and CI 1.8.   NE increased to 9. Currently given 1uRBC, albumin and FFP.   Chest remains open but looks dry.   Oxygenation ok.   Continue supportive care. Titrate gtts to MAP > 70 and CI >= 1.9  Additional 45 CCT   Glori Bickers, MD  6:31 PM

## 2021-06-10 NOTE — Progress Notes (Signed)
Nutrition Follow-up  DOCUMENTATION CODES:   Not applicable  INTERVENTION:   Pt with inadequate/minimal nutrition x 6 days, if unable to resume TF and progress, recommend initiation of TPN  Tube Feeding via OG vs post pyloric Cortrak: Consider Pivot 1.5 at 20 ml/hr , Goal rate of 55 ml/hr Provides 1980 kcals, 124 g of protein and 1003 mL of free water   No BM x 7 days; recommend adjusting bowel regimen, consider addition of promotility agent, erythromycin  NUTRITION DIAGNOSIS:   Inadequate oral intake related to acute illness as evidenced by NPO status.  Continues   GOAL:   Patient will meet greater than or equal to 90% of their needs  Progressing  MONITOR:   Vent status, TF tolerance, Labs, Weight trends  REASON FOR ASSESSMENT:   Consult, Ventilator Enteral/tube feeding initiation and management (ECMO)  ASSESSMENT:   60 yo female admitted with chest pain with STEMI requiring emergent CABG, cardiogenic shock in setting of left main dissection requiring VA ECMO, intubation PMH includes DM, HTN, HLD, CVA  6/14 Admitted 6/15 Cannulated for VA ECMO, Intubated 6/16 TF initiated at 20 ml/hr  OR today for VA ECMO decannulation Pt remains sedated on vent  TF on hold, OG to LIS with bilious output. 600 mL yesterday, 400 mL thus far. Appears TF has been on hold since 6/18 evening.  Pt with inadequate intake x 6 days-NPO or trickle TF only  Plan for post pyloric Cortrak tube as able  Labs: reviewed Meds: ss novolog, levemir   Diet Order:   Diet Order             Diet NPO time specified  Diet effective now                   EDUCATION NEEDS:   Not appropriate for education at this time  Skin:  Skin Assessment: Reviewed RN Assessment  Last BM:  6/13  Height:   Ht Readings from Last 1 Encounters:  06/03/2021 5\' 3"  (1.6 m)    Weight:   Wt Readings from Last 1 Encounters:  06/03/2021 95.8 kg    Ideal Body Weight:     BMI:  Body mass index is 37.41  kg/m.  Estimated Nutritional Needs:   Kcal:  1900-2200 kcals  Protein:  105-130 g  Fluid:  >/= 1.9 L   Kerman Passey MS, RDN, LDN, CNSC Registered Dietitian III Clinical Nutrition RD Pager and On-Call Pager Number Located in [place]

## 2021-06-10 NOTE — Progress Notes (Signed)
  Echocardiogram Echocardiogram Transesophageal has been performed.  [name] [name] 05/28/2021, 2:59 PM

## 2021-06-10 NOTE — Progress Notes (Signed)
Vent changes made per Dr. Lynetta Mare. VT at 8cc's, peep +5, RR at 18 to achieve a MVe of 7.

## 2021-06-11 ENCOUNTER — Inpatient Hospital Stay (HOSPITAL_COMMUNITY): Payer: 59

## 2021-06-11 ENCOUNTER — Encounter (HOSPITAL_COMMUNITY): Payer: Self-pay | Admitting: Thoracic Surgery (Cardiothoracic Vascular Surgery)

## 2021-06-11 DIAGNOSIS — Z951 Presence of aortocoronary bypass graft: Secondary | ICD-10-CM | POA: Diagnosis not present

## 2021-06-11 DIAGNOSIS — R079 Chest pain, unspecified: Secondary | ICD-10-CM | POA: Diagnosis not present

## 2021-06-11 DIAGNOSIS — R092 Respiratory arrest: Secondary | ICD-10-CM

## 2021-06-11 DIAGNOSIS — J810 Acute pulmonary edema: Secondary | ICD-10-CM

## 2021-06-11 DIAGNOSIS — R570 Cardiogenic shock: Secondary | ICD-10-CM | POA: Diagnosis not present

## 2021-06-11 DIAGNOSIS — I2510 Atherosclerotic heart disease of native coronary artery without angina pectoris: Secondary | ICD-10-CM | POA: Diagnosis not present

## 2021-06-11 LAB — BPAM RBC
Blood Product Expiration Date: 202207152359
Blood Product Expiration Date: 202207152359
Blood Product Expiration Date: 202207162359
Blood Product Expiration Date: 202207162359
Blood Product Expiration Date: 202207222359
Blood Product Expiration Date: 202207222359
Blood Product Expiration Date: 202207222359
Blood Product Expiration Date: 202207222359
Blood Product Expiration Date: 202207222359
ISSUE DATE / TIME: 202206200033
ISSUE DATE / TIME: 202206200046
ISSUE DATE / TIME: 202206200046
ISSUE DATE / TIME: 202206200046
ISSUE DATE / TIME: 202206200437
ISSUE DATE / TIME: 202206201329
ISSUE DATE / TIME: 202206201329
ISSUE DATE / TIME: 202206201329
ISSUE DATE / TIME: 202206201329
Unit Type and Rh: 5100
Unit Type and Rh: 5100
Unit Type and Rh: 5100
Unit Type and Rh: 5100
Unit Type and Rh: 5100
Unit Type and Rh: 5100
Unit Type and Rh: 5100
Unit Type and Rh: 5100
Unit Type and Rh: 5100

## 2021-06-11 LAB — PREPARE FRESH FROZEN PLASMA
Unit division: 0
Unit division: 0

## 2021-06-11 LAB — BASIC METABOLIC PANEL
Anion gap: 7 (ref 5–15)
Anion gap: 11 (ref 5–15)
Anion gap: 11 (ref 5–15)
BUN: 16 mg/dL (ref 6–20)
BUN: 18 mg/dL (ref 6–20)
BUN: 19 mg/dL (ref 6–20)
CO2: 21 mmol/L — ABNORMAL LOW (ref 22–32)
CO2: 21 mmol/L — ABNORMAL LOW (ref 22–32)
CO2: 21 mmol/L — ABNORMAL LOW (ref 22–32)
Calcium: 8.1 mg/dL — ABNORMAL LOW (ref 8.9–10.3)
Calcium: 7.9 mg/dL — ABNORMAL LOW (ref 8.9–10.3)
Calcium: 7.9 mg/dL — ABNORMAL LOW (ref 8.9–10.3)
Chloride: 108 mmol/L (ref 98–111)
Chloride: 102 mmol/L (ref 98–111)
Chloride: 103 mmol/L (ref 98–111)
Creatinine, Ser: 1.88 mg/dL — ABNORMAL HIGH (ref 0.44–1.00)
Creatinine, Ser: 2.25 mg/dL — ABNORMAL HIGH (ref 0.44–1.00)
Creatinine, Ser: 2.45 mg/dL — ABNORMAL HIGH (ref 0.44–1.00)
GFR, Estimated: 30 mL/min — ABNORMAL LOW (ref >60)
GFR, Estimated: 24 mL/min — ABNORMAL LOW (ref >60)
GFR, Estimated: 22 mL/min — ABNORMAL LOW (ref >60)
Glucose, Bld: 102 mg/dL — ABNORMAL HIGH (ref 70–99)
Glucose, Bld: 123 mg/dL — ABNORMAL HIGH (ref 70–99)
Glucose, Bld: 127 mg/dL — ABNORMAL HIGH (ref 70–99)
Potassium: 4.5 mmol/L (ref 3.5–5.1)
Potassium: 4.3 mmol/L (ref 3.5–5.1)
Potassium: 4.7 mmol/L (ref 3.5–5.1)
Sodium: 136 mmol/L (ref 135–145)
Sodium: 134 mmol/L — ABNORMAL LOW (ref 135–145)
Sodium: 135 mmol/L (ref 135–145)

## 2021-06-11 LAB — TYPE AND SCREEN
ABO/RH(D): O POS
Antibody Screen: NEGATIVE
Unit division: 0
Unit division: 0
Unit division: 0
Unit division: 0
Unit division: 0
Unit division: 0
Unit division: 0
Unit division: 0
Unit division: 0

## 2021-06-11 LAB — BPAM PLATELET PHERESIS
Blood Product Expiration Date: 202206212359
Blood Product Expiration Date: 202206222359
ISSUE DATE / TIME: 202206201328
ISSUE DATE / TIME: 202206201330
Unit Type and Rh: 5100
Unit Type and Rh: 5100

## 2021-06-11 LAB — POCT I-STAT 7, (LYTES, BLD GAS, ICA,H+H)
Acid-base deficit: 4 mmol/L — ABNORMAL HIGH (ref 0.0–2.0)
Acid-base deficit: 5 mmol/L — ABNORMAL HIGH (ref 0.0–2.0)
Acid-base deficit: 4 mmol/L — ABNORMAL HIGH (ref 0.0–2.0)
Bicarbonate: 21.4 mmol/L (ref 20.0–28.0)
Bicarbonate: 21.5 mmol/L (ref 20.0–28.0)
Bicarbonate: 21.7 mmol/L (ref 20.0–28.0)
Calcium, Ion: 1.23 mmol/L (ref 1.15–1.40)
Calcium, Ion: 1.19 mmol/L (ref 1.15–1.40)
Calcium, Ion: 1.17 mmol/L (ref 1.15–1.40)
HCT: 31 % — ABNORMAL LOW (ref 36.0–46.0)
HCT: 28 % — ABNORMAL LOW (ref 36.0–46.0)
HCT: 28 % — ABNORMAL LOW (ref 36.0–46.0)
Hemoglobin: 10.5 g/dL — ABNORMAL LOW (ref 12.0–15.0)
Hemoglobin: 9.5 g/dL — ABNORMAL LOW (ref 12.0–15.0)
Hemoglobin: 9.5 g/dL — ABNORMAL LOW (ref 12.0–15.0)
O2 Saturation: 93 %
O2 Saturation: 89 %
O2 Saturation: 93 %
Patient temperature: 37.3
Patient temperature: 38.4
Patient temperature: 38.7
Potassium: 4.6 mmol/L (ref 3.5–5.1)
Potassium: 4.5 mmol/L (ref 3.5–5.1)
Potassium: 4.6 mmol/L (ref 3.5–5.1)
Sodium: 137 mmol/L (ref 135–145)
Sodium: 135 mmol/L (ref 135–145)
Sodium: 135 mmol/L (ref 135–145)
TCO2: 23 mmol/L (ref 22–32)
TCO2: 23 mmol/L (ref 22–32)
TCO2: 23 mmol/L (ref 22–32)
pCO2 arterial: 41.2 mmHg (ref 32.0–48.0)
pCO2 arterial: 49.2 mmHg — ABNORMAL HIGH (ref 32.0–48.0)
pCO2 arterial: 44.5 mmHg (ref 32.0–48.0)
pH, Arterial: 7.324 — ABNORMAL LOW (ref 7.350–7.450)
pH, Arterial: 7.256 — ABNORMAL LOW (ref 7.350–7.450)
pH, Arterial: 7.305 — ABNORMAL LOW (ref 7.350–7.450)
pO2, Arterial: 74 mmHg — ABNORMAL LOW (ref 83.0–108.0)
pO2, Arterial: 70 mmHg — ABNORMAL LOW (ref 83.0–108.0)
pO2, Arterial: 82 mmHg — ABNORMAL LOW (ref 83.0–108.0)

## 2021-06-11 LAB — GLUCOSE, CAPILLARY
Glucose-Capillary: 104 mg/dL — ABNORMAL HIGH (ref 70–99)
Glucose-Capillary: 109 mg/dL — ABNORMAL HIGH (ref 70–99)
Glucose-Capillary: 109 mg/dL — ABNORMAL HIGH (ref 70–99)
Glucose-Capillary: 113 mg/dL — ABNORMAL HIGH (ref 70–99)
Glucose-Capillary: 127 mg/dL — ABNORMAL HIGH (ref 70–99)
Glucose-Capillary: 98 mg/dL (ref 70–99)

## 2021-06-11 LAB — COOXEMETRY PANEL
Carboxyhemoglobin: 1.1 % (ref 0.5–1.5)
Methemoglobin: 1.1 % (ref 0.0–1.5)
O2 Saturation: 60.4 %
Total hemoglobin: 11.4 g/dL — ABNORMAL LOW (ref 12.0–16.0)

## 2021-06-11 LAB — BPAM FFP
Blood Product Expiration Date: 202206242359
Blood Product Expiration Date: 202206242359
Blood Product Expiration Date: 202206242359
Blood Product Expiration Date: 202206242359
ISSUE DATE / TIME: 202206201324
ISSUE DATE / TIME: 202206201324
ISSUE DATE / TIME: 202206201324
ISSUE DATE / TIME: 202206201324
Unit Type and Rh: 6200
Unit Type and Rh: 6200
Unit Type and Rh: 6200
Unit Type and Rh: 6200

## 2021-06-11 LAB — PREPARE PLATELET PHERESIS
Unit division: 0
Unit division: 0

## 2021-06-11 LAB — CBC
HCT: 32.3 % — ABNORMAL LOW (ref 36.0–46.0)
Hemoglobin: 11 g/dL — ABNORMAL LOW (ref 12.0–15.0)
MCH: 29.9 pg (ref 26.0–34.0)
MCHC: 34.1 g/dL (ref 30.0–36.0)
MCV: 87.8 fL (ref 80.0–100.0)
Platelets: 32 10*3/uL — ABNORMAL LOW (ref 150–400)
RBC: 3.68 MIL/uL — ABNORMAL LOW (ref 3.87–5.11)
RDW: 15.9 % — ABNORMAL HIGH (ref 11.5–15.5)
WBC: 8.5 10*3/uL (ref 4.0–10.5)
nRBC: 4.7 % — ABNORMAL HIGH (ref 0.0–0.2)

## 2021-06-11 LAB — HEPATIC FUNCTION PANEL
ALT: 8 U/L (ref 0–44)
AST: 74 U/L — ABNORMAL HIGH (ref 15–41)
Albumin: 2.5 g/dL — ABNORMAL LOW (ref 3.5–5.0)
Alkaline Phosphatase: 62 U/L (ref 38–126)
Bilirubin, Direct: 0.3 mg/dL — ABNORMAL HIGH (ref 0.0–0.2)
Indirect Bilirubin: 0.5 mg/dL (ref 0.3–0.9)
Total Bilirubin: 0.8 mg/dL (ref 0.3–1.2)
Total Protein: 4.6 g/dL — ABNORMAL LOW (ref 6.5–8.1)

## 2021-06-11 LAB — PROTIME-INR
INR: 1.2 (ref 0.8–1.2)
Prothrombin Time: 15 seconds (ref 11.4–15.2)

## 2021-06-11 LAB — MAGNESIUM: Magnesium: 1.7 mg/dL (ref 1.7–2.4)

## 2021-06-11 LAB — HEPARIN LEVEL (UNFRACTIONATED): Heparin Unfractionated: <0.1 IU/mL — ABNORMAL LOW (ref 0.30–0.70)

## 2021-06-11 LAB — LACTATE DEHYDROGENASE: LDH: 494 U/L — ABNORMAL HIGH (ref 98–192)

## 2021-06-11 LAB — FIBRINOGEN: Fibrinogen: 527 mg/dL — ABNORMAL HIGH (ref 210–475)

## 2021-06-11 LAB — APTT: aPTT: 50 seconds — ABNORMAL HIGH (ref 24–36)

## 2021-06-11 MED ORDER — AMIODARONE LOAD VIA INFUSION
150.0000 mg | Freq: Once | INTRAVENOUS | Status: AC
  Administered 2021-06-11: 150 mg via INTRAVENOUS
  Filled 2021-06-11: qty 83.34

## 2021-06-11 MED ORDER — BISACODYL 10 MG RE SUPP
10.0000 mg | Freq: Every day | RECTAL | Status: DC | PRN
  Administered 2021-06-11: 10 mg via RECTAL
  Filled 2021-06-11: qty 1

## 2021-06-11 MED ORDER — SODIUM CHLORIDE 0.9 % IV SOLN
2.0000 g | INTRAVENOUS | Status: DC
  Administered 2021-06-12 – 2021-06-15 (×4): 2 g via INTRAVENOUS
  Filled 2021-06-11 (×4): qty 2

## 2021-06-11 MED ORDER — SODIUM CHLORIDE 0.9 % IV SOLN
2.0000 g | Freq: Two times a day (BID) | INTRAVENOUS | Status: DC
  Administered 2021-06-11: 2 g via INTRAVENOUS
  Filled 2021-06-11: qty 2

## 2021-06-11 MED ORDER — POLYETHYLENE GLYCOL 3350 17 G PO PACK
17.0000 g | PACK | Freq: Every day | ORAL | Status: DC | PRN
  Administered 2021-06-11: 17 g via ORAL
  Filled 2021-06-11: qty 1

## 2021-06-11 MED ORDER — MAGNESIUM SULFATE 4 GM/100ML IV SOLN
4.0000 g | Freq: Once | INTRAVENOUS | Status: AC
  Administered 2021-06-11: 4 g via INTRAVENOUS
  Filled 2021-06-11: qty 100

## 2021-06-11 MED ORDER — PROSOURCE TF PO LIQD
90.0000 mL | Freq: Four times a day (QID) | ORAL | Status: DC
  Administered 2021-06-11 – 2021-06-14 (×6): 90 mL
  Filled 2021-06-11 (×7): qty 90

## 2021-06-11 MED ORDER — FUROSEMIDE 10 MG/ML IJ SOLN
8.0000 mg/h | INTRAVENOUS | Status: DC
  Administered 2021-06-11: 8 mg/h via INTRAVENOUS
  Filled 2021-06-11: qty 20

## 2021-06-11 MED ORDER — VANCOMYCIN HCL 1250 MG/250ML IV SOLN
1250.0000 mg | INTRAVENOUS | Status: DC
  Administered 2021-06-11 – 2021-06-15 (×3): 1250 mg via INTRAVENOUS
  Filled 2021-06-11 (×3): qty 250

## 2021-06-11 MED ORDER — HEPARIN (PORCINE) 25000 UT/250ML-% IV SOLN
350.0000 [IU]/h | INTRAVENOUS | Status: DC
  Administered 2021-06-11: 250 [IU]/h via INTRAVENOUS

## 2021-06-11 MED ORDER — VANCOMYCIN HCL 2000 MG/400ML IV SOLN
2000.0000 mg | INTRAVENOUS | Status: DC
  Filled 2021-06-11: qty 400

## 2021-06-11 MED ORDER — VANCOMYCIN HCL 1250 MG/250ML IV SOLN: 1250.0000 mg | INTRAVENOUS | Status: DC

## 2021-06-11 MED ORDER — ACETAMINOPHEN 325 MG PO TABS: 650.0000 mg | ORAL_TABLET | Freq: Four times a day (QID) | ORAL | Status: DC | PRN

## 2021-06-11 MED ORDER — FUROSEMIDE 10 MG/ML IJ SOLN
80.0000 mg | Freq: Once | INTRAMUSCULAR | Status: AC
  Administered 2021-06-11: 80 mg via INTRAVENOUS
  Filled 2021-06-11: qty 8

## 2021-06-11 MED ORDER — ACETAMINOPHEN 160 MG/5ML PO SOLN
650.0000 mg | Freq: Four times a day (QID) | ORAL | Status: DC | PRN
  Administered 2021-06-11 – 2021-06-15 (×2): 650 mg
  Filled 2021-06-11 (×2): qty 20.3

## 2021-06-11 MED ORDER — FUROSEMIDE 10 MG/ML IJ SOLN
60.0000 mg | Freq: Two times a day (BID) | INTRAMUSCULAR | Status: DC
  Administered 2021-06-11: 60 mg via INTRAVENOUS
  Filled 2021-06-11: qty 6

## 2021-06-11 NOTE — Progress Notes (Signed)
Critical care at bedside, changed RR on vent based off last ABG. Respiratory notified, ABG to be checked in one hour. Also notified of increasing fever. Ice packs to be applied.

## 2021-06-11 NOTE — Progress Notes (Signed)
Daily Progress Note   Patient Name: [name] [name]       Date: 06/11/2021 DOB: 10/01/1961  Age: 60 y.o. MRN#: [id number] Attending Physician: Melrose Nakayama, MD Primary Care Physician: Lucianne Lei, MD Admit Date: 06/16/2021  Reason for Consultation/Follow-up: Establishing goals of care and Psychosocial/spiritual support  Subjective: Off ECMO. BIS monitoring in place in efforts to decrease sedation.  Chest xray showing increasing edema and new infiltrates. Anselm Jungling for support and followup. Family continues to hope for the best- but are prepared for the worst.    Length of Stay: 6  Current Medications: Scheduled Meds:  . aspirin  324 mg Per Tube Daily  . chlorhexidine gluconate (MEDLINE KIT)  15 mL Mouth Rinse BID  . Chlorhexidine Gluconate Cloth  6 each Topical Daily  . docusate  200 mg Per Tube Daily  . feeding supplement (PROSource TF)  90 mL Per Tube QID  . insulin aspart  1-3 Units Subcutaneous Q4H  . insulin detemir  5 Units Subcutaneous Q12H  . mouth rinse  15 mL Mouth Rinse 10 times per day  . pantoprazole sodium  40 mg Per Tube Daily  . rosuvastatin  40 mg Per Tube q1800  . sennosides  10 mL Per Tube Daily  . sodium chloride flush  10-40 mL Intracatheter Q12H  . sodium chloride flush  3 mL Intravenous Q12H    Continuous Infusions: . sodium chloride Stopped (05/29/2021 0918)  . sodium chloride    . sodium chloride    . amiodarone 30 mg/hr (06/11/21 1400)  .  ceFAZolin (ANCEF) IV 200 mL/hr at 06/11/21 1400  . dexmedetomidine (PRECEDEX) IV infusion 0.5 mcg/kg/hr (06/11/21 1400)  . epinephrine 5 mcg/min (06/11/21 1400)  . feeding supplement (PIVOT 1.5 CAL) 1,000 mL (06/11/21 1147)  . furosemide (LASIX) 200 mg in dextrose 5% 100 mL (52m/mL) infusion 8 mg/hr  (06/11/21 1404)  . heparin 25 units/mL (Impella PURGE) in dextrose 5 % 1000 mL bag    . HYDROmorphone 6 mg/hr (06/11/21 1400)  . lactated ringers    . lactated ringers 10 mL/hr at 06/11/21 1400  . lactated ringers    . midazolam 4 mg/hr (06/11/21 1400)  . milrinone 0.375 mcg/kg/min (06/11/21 1400)  . norepinephrine (LEVOPHED) Adult infusion 24 mcg/min (06/11/21 1400)  . vasopressin Stopped (06/03/2021 2151)  PRN Meds: sodium chloride, sodium chloride, sodium chloride, bisacodyl, HYDROmorphone, ipratropium-albuterol, lactated ringers, metoprolol tartrate, midazolam, ondansetron (ZOFRAN) IV, sodium chloride flush, sodium chloride flush       Vital Signs: BP 102/73   Pulse (!) 106   Temp 100.04 F (37.8 C)   Resp 18   Ht '5\' 3"'  (1.6 m)   Wt 97.2 kg   SpO2 97%   BMI 37.96 kg/m  SpO2: SpO2: 97 % O2 Device: O2 Device: Ventilator O2 Flow Rate: O2 Flow Rate (L/min): 2 L/min  Intake/output summary:  Intake/Output Summary (Last 24 hours) at 06/11/2021 1446 Last data filed at 06/11/2021 1400 Gross per 24 hour  Intake 4524.19 ml  Output 3250 ml  Net 1274.19 ml   LBM: Last BM Date: 06/03/21 Baseline Weight: Weight: 87.1 kg Most recent weight: Weight: 97.2 kg       Palliative Assessment/Data: PPS: 10%      Patient Active Problem List   Diagnosis Date Noted  . S/P CABG x 2 06/08/2021  . Left main coronary artery disease 05/30/2021  . Non-ST elevation (NSTEMI) myocardial infarction ([place])   . Encounter for screening colonoscopy for non-high-risk patient 07/04/2019  . Polypharmacy 07/04/2019  . Severe obesity (BMI >= 40) ([place]) 07/04/2019  . Left hemiparesis ([place]) 11/17/2016  . Idiopathic hypotension   . Cognitive deficit, post-stroke   . CVA (cerebral vascular accident) ([place]) 10/29/2016  . Stroke due to embolism of middle cerebral artery ([place]) 10/29/2016  . Seizure prophylaxis   . History of supraventricular tachycardia   . Neurologic abnormality   . Stroke syndrome   .  Vascular headache   . Other secondary hypertension   . Hypokalemia   . AKI (acute kidney injury) ([place])   . Lymphocytosis   . Lethargy   . Brain edema ([place])   . Intracranial aneurysm 07/09/2016  . Communicating hydrocephalus ([place]) 02/03/2016  . Essential hypertension   . Paroxysmal SVT (supraventricular tachycardia) ([place])   . Paroxysmal atrial fibrillation ([facility])   . Tobacco abuse   . Tachypnea   . Bradycardia   . Leukocytosis   . Acute blood loss anemia   . Thrombocytosis   . Chest pain   . Nontraumatic subarachnoid hemorrhage ([place])   . Acute respiratory failure ([place])   . Encounter for central line placement   . Hypoxemia   . Renal insufficiency   . Encounter for imaging study to confirm nasogastric (NG) tube placement   . Endotracheally intubated   . Respiratory failure ([place])   . SAH (subarachnoid hemorrhage) ([place])   . Subarachnoid hemorrhage due to ruptured aneurysm ([place]) 01/03/2016  . Subarachnoid hemorrhage ([place]) 01/03/2016  . Atrial fibrillation with rapid ventricular response ([place])   . Elevated lactic acid level   . SMOKER 03/20/2009  . HYPERTENSION, UNSPECIFIED 03/20/2009  . WPW 03/20/2009  . SVT/ PSVT/ PAT 03/20/2009  . BRONCHITIS 03/20/2009    Palliative Care Assessment & Plan   Patient Profile: 60 y.o. female  with past medical history of brain aneurysm, HTN, SVT s/p ablation, HTN, hypothyroidism admitted on 06/06/2021 with chest pain. Cardiac cath revealed SCAD with no distal flow on second injection, during procedure she complained of crushing chest pain, hypotension, suffered cardiogenic shock was found with STEMI- had Impella placed, and taken for emergent CABG. After CABG was not able to wean from the Impella and therefore was cannulated and VA ECMO started. She remains intubated, moving extremities at times, EEG shows significant encephalopathy. Palliative medicine consulted for "ECMO".  Assessment/Recommendations/Plan  Continue current scope PMT will  monitor chart and intervene for decline- please call if assistance is needed sooner  Goals of Care and Additional Recommendations: Limitations on Scope of Treatment: Full Scope Treatment  Code Status: Limited code  Prognosis:  Unable to determine  Discharge Planning: To Be Determined  Care plan was discussed with care team and niece.   Thank you for allowing the Palliative Medicine Team to assist in the care of this patient.   Total time: 29 mins  Greater than 50%  of this time was spent counseling and coordinating care related to the above assessment and plan.  Mariana Kaufman, AGNP-C Palliative Medicine   Please contact Palliative Medicine Team phone at [phone] for questions and concerns.

## 2021-06-11 NOTE — Progress Notes (Signed)
Patient had sudden drop in BP at 2040 with minimal to no pulsatility on aline 62/57 (correlated with placement signal on impella). Administered albumin wide open and increased rate of FFP that was going at the time. Titrated up on levo and epi, see MAR. Patient's BP recovered. CT's and UOP both putting out 20/30 an hour. CO/CI dropped 2.7/1.4. Notified both Dr. Cyndia Bent and Dr. Haroldine Laws.   Stopped vaso per Dr. Haroldine Laws, up-titrated on both levo and epi. RN to use levo for BP management, Epi increased to 39mcg, and Milrinone increased to 0.375. CO/CI increased to 3.36/1.75 after interventions.

## 2021-06-11 NOTE — Progress Notes (Signed)
1 Day Post-Op Procedure(s) (LRB): DECANNULATION FOR ECMO (EXTRACORPOREAL MEMBRANE OXYGENATION) (N/A) Subjective: Intubated, sedated  Objective: Vital signs in last 24 hours: Temp:  [96.1 F (35.6 C)-99.5 F (37.5 C)] 99.5 F (37.5 C) (06/21 0745) Pulse Rate:  [79-105] 99 (06/21 0745) Cardiac Rhythm: Normal sinus rhythm (06/21 0400) Resp:  [18] 18 (06/21 0742) BP: (65-116)/(55-86) 92/60 (06/21 0742) SpO2:  [94 %-100 %] 95 % (06/21 0745) Arterial Line BP: (62-119)/(56-87) 91/60 (06/21 0745) FiO2 (%):  [30 %-60 %] 60 % (06/21 0742) Weight:  [97.2 kg] 97.2 kg (06/21 0500)  Hemodynamic parameters for last 24 hours: PAP: (16-59)/(9-27) 29/16 CVP:  [7 mmHg-14 mmHg] 9 mmHg CO:  [2.7 L/min-3.7 L/min] 3.7 L/min CI:  [1.4 L/min/m2-1.9 L/min/m2] 1.9 L/min/m2  Intake/Output from previous day: 06/20 0701 - 06/21 0700 In: 5787.4 [I.V.:1607.4; Blood:2708; NG/GT:190; IV Piggyback:887.7] Out: 3700 [Urine:1510; Emesis/NG output:1050; Blood:350; Chest Tube:790] Intake/Output this shift: No intake/output data recorded.  General appearance: intubated, sedated Lungs: rhonchi bilaterally Abdomen: doft Wound: esmark in place  Lab Results: Recent Labs    06/18/2021 1649 05/29/2021 2020 06/11/21 0331 06/11/21 0542  WBC 8.4  --  8.5  --   HGB 10.1*   < > 11.0* 10.5*  HCT 30.0*   < > 32.3* 31.0*  PLT 45*  --  32*  --    < > = values in this interval not displayed.   BMET:  Recent Labs    05/22/2021 1649 06/03/2021 2020 06/11/21 0331 06/11/21 0542  NA 137   < > 136 137  K 4.2   < > 4.5 4.6  CL 108  --  108  --   CO2 22  --  21*  --   GLUCOSE 124*  --  102*  --   BUN 13  --  16  --   CREATININE 1.64*  --  1.88*  --   CALCIUM 8.2*  --  8.1*  --    < > = values in this interval not displayed.    PT/INR:  Recent Labs    06/11/21 0331  LABPROT 15.0  INR 1.2   ABG    Component Value Date/Time   PHART 7.324 (L) 06/11/2021 0542   HCO3 21.4 06/11/2021 0542   TCO2 23 06/11/2021 0542    ACIDBASEDEF 4.0 (H) 06/11/2021 0542   O2SAT 93.0 06/11/2021 0542   CBG (last 3)  Recent Labs    06/02/2021 2018 06/11/21 0007 06/11/21 0333  GLUCAP 123* 109* 113*    Assessment/Plan: S/P Procedure(s) (LRB): DECANNULATION FOR ECMO (EXTRACORPOREAL MEMBRANE OXYGENATION) (N/A) Remains critically ill CV- cardiac index 1.8-1.9 with Impella at p7, 3 L flow, some pulastility  On milrinone, epi RESP_ VDRF- oxygenation a little worse this AM, on 60%  Vent per CCM RENAL- creatinine 1.8, lytes OK ENDO- CBG well controlled ID- on periop coverage Hopefully can close chest later this week   LOS: 6 days    [name] [name] 06/11/2021

## 2021-06-11 NOTE — Progress Notes (Signed)
Patients O2 sats dropped to 72%.  Pulse ox changed and remained 72-80%  Patitne given 100% O2 breaths and Dr. Lynetta Mare notified.  Orders for STAT pcxr to be put in by MD.  ABG pending; results called to Dr. Lynetta Mare.

## 2021-06-11 NOTE — Progress Notes (Addendum)
ECMO PROGRESS NOTE  NAME:  [name] [name], MRN:  [id number], DOB:  [birth year], LOS: 6 ADMISSION DATE:  06/20/2021, CONSULTATION DATE: 05/24/2021 REFERRING MD: Roxan Hockey, CHIEF COMPLAINT: VA ECMO  HPI/course in hospital   60 year old woman with hx of HTN, HLD, hypothyroidism, CVA, DM2 who presented with angina.  Underwent cardiac cath which showed left main dissection, during this procedure became acutely unstable with crushing chest pain and shock.  Taken emergently to OR for bypass after 3-5 impella placed.  Tough to wean from bypass so left centrally cannulated on VA ECMO and sent to [place].  PCCM consulted to help with management.  Past Medical History   Past Medical History:  Diagnosis Date   Arthritis    Brain aneurysm    Diabetes mellitus without complication (HCC)    Headache    Hypertension        SVT (supraventricular tachycardia) ([place])    s/p radiofrequency catheter ablation for AVNRT 02/16/09 (Dr. Cristopher Peru)   Thyroid disease    PROCEDURE FOR THYROID 15 YRS AGO AT [facility]   WPW (Wolff-Parkinson-White syndrome)      Past Surgical History:  Procedure Laterality Date   BREAST SURGERY     BX RT BREAST  BENIGN   CORONARY ARTERY BYPASS GRAFT N/A 05/30/2021   Procedure: CORONARY ARTERY BYPASS GRAFTING (CABG)X2.USING  LEFT INTERNAL MAMMARY ARTERY AND  RIGHT ENDOSCOPIC SAPHENOUS VEIN HARVESTING. ECMO INSERTION;  Surgeon: Melrose Nakayama, MD;  Location: [place];  Service: Open Heart Surgery;  Laterality: N/A;   CRANIOTOMY N/A 01/04/2016   Procedure: Suboccipital Craniotomy and Cervical one Laminectomy for Clipping of Aneurysm;  Surgeon: Kevan Ny Ditty, MD;  Location: [place] [facility];  Service: Neurosurgery;  Laterality: N/A;   CRANIOTOMY N/A 07/09/2016   Procedure: Craniotomy for clipping of left middle cerebral artery aneurysm;  Surgeon: Kevan Ny Ditty, MD;  Location: [place] [facility];  Service: Neurosurgery;  Laterality: N/A;  Craniotomy for clipping of left middle cerebral  artery aneurysm   CRANIOTOMY Right 10/24/2016   Procedure: 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 Christella Noa;  Surgeon: Kevan Ny Ditty, MD;  Location: [place];  Service: Neurosurgery;  Laterality: Right;   ELECTROPHYSIOLOGIC STUDY     LAPAROSCOPIC REVISION VENTRICULAR-PERITONEAL (V-P) SHUNT N/A 02/04/2016   Procedure: LAPAROSCOPIC Insertion VENTRICULAR-PERITONEAL (V-P) SHUNT;  Surgeon: Rolm Bookbinder, MD;  Location: [place] [facility];  Service: General;  Laterality: N/A;   LEFT HEART CATH AND CORONARY ANGIOGRAPHY N/A 06/15/2021   Procedure: LEFT HEART CATH AND CORONARY ANGIOGRAPHY;  Surgeon: Jettie Booze, MD;  Location: [place] [facility];  Service: Cardiovascular;  Laterality: N/A;   RADIOLOGY WITH ANESTHESIA N/A 01/03/2016   Procedure: RADIOLOGY WITH ANESTHESIA;  Surgeon: Medication Radiologist, MD;  Location: [place];  Service: Radiology;  Laterality: N/A;   STERNAL WOUND DEBRIDEMENT N/A 06/08/2021   Procedure: Reexploration and evacuation of hematoma;  Surgeon: Melrose Nakayama, MD;  Location: [facility];  Service: Thoracic;  Laterality: N/A;   TEE WITHOUT CARDIOVERSION N/A 06/14/2021   Procedure: TRANSESOPHAGEAL ECHOCARDIOGRAM (TEE);  Surgeon: Melrose Nakayama, MD;  Location: [place];  Service: Open Heart Surgery;  Laterality: N/A;   VENTRICULAR ASSIST DEVICE INSERTION N/A 05/30/2021   Procedure: VENTRICULAR ASSIST DEVICE INSERTION;  Surgeon: Jettie Booze, MD;  Location: [place] [facility];  Service: Cardiovascular;  Laterality: N/A;   VENTRICULOPERITONEAL SHUNT Right 02/04/2016   Procedure: SHUNT INSERTION VENTRICULAR-PERITONEAL With Laparoscopic Assistance;  Surgeon: Kevan Ny Ditty,  MD;  Location: [place] [facility];  Service: Neurosurgery;  Laterality: Right;     Significant Hospital events:  6/14 admitted 6/15 spontaneous artery dissection left main on cath.CP Impella placed.   6/15 emergent coronary artery bypass graft, LIMA to LAD, SVG  to OM.  Brief cardiac arrest of less than 10 minutes immediately prior to initiation of cardiopulmonary bypass. 6/15 unable to wean.  Still unable to wean from bypass so placed on centrally cannulated VA ECMO. 6/16 EEG shows encephalopathy.  6/16 Cold right hand - seen by vascular, conservative management 6/17 Hand improved. 756m clot removed with washout at the bedside  6/19 RUL collapse - underwent bronchoscopy and aspiration of RUL mucus plug.  6/20 ECMO Decannulation 6/21>> BIS monitoring started , weaning of sedation, wet CXR  Interim history/subjective:  Remains on Precedex at 0.4 Versed at 5 Dilaudid at 4 Levo at 19 Epi at 5 Milrinone at 0.375 Amio at 30 Na 136, K 4,5, creatinine 1.88, Mag 1.7, ionized Ca 1.23 LDH 494 WBC 8.5, HGB 11, platelets 32K T max 99.2 Fibrinogen 527 7.32/41.2/74/23/21.4>>  60%, 5 PEEP, rate 18, TV 410    CXR  New prominent bilateral pulmonary infiltrates and or edema noted on today's exam. Persistent bilateral subsegmental atelectasis.  Plan per surgery is to get rid of as much swelling as possible and hopefully close chest by the end of the week.   Objective   Blood pressure 99/66, pulse 100, temperature 99.68 F (37.6 C), resp. rate 18, height _0  (1.6 m), weight 97.2 kg, SpO2 93 %. PAP: (16-59)/(9-27) 29/16 CVP:  [7 mmHg-14 mmHg] 9 mmHg CO:  [2.7 L/min-4.1 L/min] 4.1 L/min CI:  [1.4 L/min/m2-2.1 L/min/m2] 2.1 L/min/m2  Vent Mode: PRVC FiO2 (%):  [30 %-60 %] 60 % Set Rate:  [18 bmp] 18 bmp Vt Set:  [410 mL] 410 mL PEEP:  [5 cmH20] 5 cmH20 Plateau Pressure:  [18 cmH20-28 cmH20] 23 cmH20   Intake/Output Summary (Last 24 hours) at 06/11/2021 0933 Last data filed at 06/11/2021 0900 Gross per 24 hour  Intake 5446.68 ml  Output 2760 ml  Net 2686.68 ml   Filed Weights   06/09/21 0443 05/29/2021 0500 06/11/21 0500  Weight: 97.3 kg 95.8 kg 97.2 kg    ECMO Device: Cardiohelp  ECMO Mode: VA  Flow (LPM): 0   Physical  Exam Constitutional:      Appearance: She is obese. Open chest HENT:     Head: Normocephalic.  Eyes:     Comments: sclerae intact Neck:     Comments: ET tube and OG tube in place Cardiovascular:     Rate and Rhythm: Normal rate and regular rhythm.     Heart sounds: Normal heart sounds.     Comments: Open chest with chest tubes,  No leak noted,  All extremities warm. Mild generalized edema. Pulmonary:     Breath sounds: Rhonchi throughout, diminished per bases    Comments: Mechanically ventilated with acceptable airway pressures.  Abdominal:     Palpations: Abdomen is soft.  Minimal OGT output. No BM lately Skin:    General: Skin is warm and dry.     Capillary Refill: Capillary refill takes 2 to 3 seconds.  Neurological:     Comments: Sedated.  No movement to voice or spontaneously, RASS -4 BIS of 23-24  Ancillary tests (personally reviewed)  CBC: Recent Labs  Lab 06/09/21 1607 06/09/21 1947 06/14/2021 0322 05/25/2021 0515 06/20/2021 0539706/09/2021 1422 06/01/2021 1504 05/31/2021 1649 06/20/2021 2020 06/11/21 0331  06/11/21 0542  WBC 7.7  --  8.4  --  9.2  --   --  8.4  --  8.5  --   HGB 8.0*   < > 7.5*   < > 8.8*   < > 8.5* 10.1* 10.5* 11.0* 10.5*  HCT 24.5*   < > 23.2*   < > 26.0*   < > 25.0* 30.0* 31.0* 32.3* 31.0*  MCV 91.1  --  93.2  --  89.7  --   --  87.0  --  87.8  --   PLT 53*  --  44*  --  40*  --   --  45*  --  32*  --    < > = values in this interval not displayed.    Basic Metabolic Panel: Recent Labs  Lab 06/06/2021 0450 06/11/2021 1506 06/08/2021 2055 06/06/21 0012 06/06/21 0402 06/06/21 0605 06/06/21 1546 06/06/21 1557 06/09/21 0420 06/09/21 5638 06/09/21 1607 06/09/21 1947 05/29/2021 0322 06/19/2021 0515 05/28/2021 1422 06/03/2021 1425 06/08/2021 1504 06/03/2021 1649 06/11/2021 2020 06/11/21 0331 06/11/21 0542  NA 139   < > 144  142   < > 139   < > 141   < > 139   < > 138   < > 138   < > 137   < > 139 137 138 136 137  K 3.8   < > 3.1*  2.9*   < > 3.4*   < > 3.7    < > 4.9   < > 5.2*   < > 5.3*   < > 4.4   < > 4.3 4.2 4.8 4.5 4.6  CL 110   < > 112*  --  112*  --  113*   < > 111  --  112*  --  111  --  105  --   --  108  --  108  --   CO2 23  --  17*  --  17*  --  20*   < > 21*  --  22  --  23  --   --   --   --  22  --  21*  --   GLUCOSE 81   < > 207*  --  251*  --  178*   < > 117*  --  145*  --  134*  --  131*  --   --  124*  --  102*  --   BUN 16   < > 13  --  14  --  14   < > 12  --  12  --  14  --  14  --   --  13  --  16  --   CREATININE 1.50*   < > 1.76*  --  1.91*  --  1.90*   < > 1.49*  --  1.65*  --  1.69*  --  1.70*  --   --  1.64*  --  1.88*  --   CALCIUM 8.9  --  7.4*  --  7.3*  --  7.4*   < > 7.6*  --  7.5*  --  7.7*  --   --   --   --  8.2*  --  8.1*  --   MG 1.9  --  1.8  --  2.6*  --  2.0  --   --   --   --   --   --   --   --   --   --   --   --  1.7  --    < > = values in this interval not displayed.   GFR: Estimated Creatinine Clearance: 35.3 mL/min (A) (by C-G formula based on SCr of 1.88 mg/dL (H)). Recent Labs  Lab 06/06/21 0402 06/06/21 1810 06/06/2021 0232 06/11/2021 0919 06/20/2021 0322 06/09/2021 0323 05/25/2021 0921 06/09/2021 1649 06/11/21 0331  WBC 9.9   < > 7.8   < > 8.4  --  9.2 8.4 8.5  LATICACIDVEN 5.1*  --  1.2  --   --  0.7 0.9  --   --    < > = values in this interval not displayed.    Liver Function Tests: Recent Labs  Lab 05/24/2021 0232 06/08/21 0302 06/09/21 0420 06/06/2021 0322 06/11/21 0331  AST 212* 87* 56* 50* 74*  ALT 53* _0 ALKPHOS 22* 28* 39 45 62  BILITOT 0.7 1.1 0.7 0.6 0.8  PROT 4.2* 3.7* 4.0* 4.1* 4.6*  ALBUMIN 3.2* 2.4* 2.4* 2.1* 2.5*   No results for input(s): LIPASE, AMYLASE in the last 168 hours. No results for input(s): AMMONIA in the last 168 hours.  ABG    Component Value Date/Time   PHART 7.324 (L) 06/11/2021 0542   PCO2ART 41.2 06/11/2021 0542   PO2ART 74 (L) 06/11/2021 0542   HCO3 21.4 06/11/2021 0542   TCO2 23 06/11/2021 0542   ACIDBASEDEF 4.0 (H) 06/11/2021 0542    O2SAT 93.0 06/11/2021 0542      Coagulation Profile: Recent Labs  Lab 05/22/2021 0232 06/08/21 0302 06/09/21 0420 05/29/2021 0322 06/11/21 0331  INR 1.5* 1.4* 1.2 1.3* 1.2    Cardiac Enzymes: No results for input(s): CKTOTAL, CKMB, CKMBINDEX, TROPONINI in the last 168 hours.  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 CP Impella to offload LV. Status post two-vessel bypass Ongoing slow bleeding due to platelet dysfunction.  RUL collapse AKI due to hypotension - creatinine improving.  Shock liver due to hypotension - improving Multifactorial thrombocytopenia - improving Status post PEA cardiac arrest Uncontrolled type 2 diabetes Hypo mag  Plan: -Titrate norepinephrine to keep MAP greater than 65 - stable NE requirement.  - Decannulated 6/20, requiring pressor support, will need diuresis 6/21 -Continue epinephrine and milrinone at current doses. -Crossover to impella, chest remains open - Diurese today - CXR is wet, no weaning vent settings until some volume removal, consider increase in rate to 20 - Replete Mag 6/21 - Trend ABG - Wean Fio2 as able  - Cortrak - at high nutritional risk. Restart tube feeds 6/21 - Monitor for bleeding/ oozing with thrombocytopenia - transfuse platelets  as needed   Daily Goals Checklist  Pain/Anxiety/Delirium protocol (if indicated): Dilaudid,  Precedex and Versed to keep RASS goal -4. Wean for BIS of 30 and no movement  VAP protocol (if indicated): Bundle in place Respiratory support goals: Rest ventilator settings.  No weaning at this time. Blood pressure target: MAP greater than 65, normal cardiac output. DVT prophylaxis: On systemic heparin for VA ECMO - maintain higher level of anticoagulation during weaning.  Nutritional status and feeding goals: . TF restart 6/21 GI prophylaxis: Pantoprazole.  Fluid status goals: Diuresis 6/21 for edema on CXR Urinary catheter: Assessment of intravascular volume Central lines: Right IJ MAC introducer, left radial art line Glucose control:  CBG and  [place] insulin.  Mobility/therapy needs: Bedrest Antibiotic de-escalation: Perioperative antibiotics Home medication reconciliation: On hold Daily labs: Per ECMO protocol Code Status: Full code Family Communication: No family at bedside Disposition: ICU  CRITICAL CARE Performed by: Magdalen Spatz  Total critical care time: 35 minutes  Critical care time was exclusive of separately billable procedures and treating other patients.  Critical care was necessary to treat or prevent imminent or life-threatening deterioration.  Critical care was time spent personally by me on the following activities: development of treatment plan with patient and/or surrogate as well as nursing, discussions with consultants, evaluation of patient's response to treatment, examination of patient, obtaining history from patient or surrogate, ordering and performing treatments and interventions, ordering and review of laboratory studies, ordering and review of radiographic studies, pulse oximetry, re-evaluation of patient's condition and participation in multidisciplinary rounds.   Magdalen Spatz, MSN, AGACNP-BC [place] for personal pager PCCM on call pager [phone]   06/11/2021, 9:33 AM

## 2021-06-11 NOTE — Progress Notes (Signed)
EVENING ROUNDS NOTE :     [address]       [place] [address]             [phone]                 1 Day Post-Op Procedure(s) (LRB): DECANNULATION FOR ECMO (EXTRACORPOREAL MEMBRANE OXYGENATION) (N/A)   Total Length of Stay:  LOS: 6 days  Events:   Afebrile today Increased in pressor requirements. 3L of flow with impella  Sepsis work-up    BP (!) 85/58   Pulse 96   Temp (!) 100.94 F (38.3 C)   Resp 18   Ht 5\' 3"  (1.6 m)   Wt 97.2 kg   SpO2 94%   BMI 37.96 kg/m   PAP: (17-46)/(11-24) 33/22 CVP:  [7 mmHg-15 mmHg] 13 mmHg PCWP:  [11 mmHg] 11 mmHg CO:  [2.7 L/min-4.5 L/min] 4.5 L/min CI:  [1.4 L/min/m2-2.3 L/min/m2] 2.3 L/min/m2  Vent Mode: PRVC FiO2 (%):  [60 %] 60 % Set Rate:  [18 bmp] 18 bmp Vt Set:  [410 mL] 410 mL PEEP:  [5 cmH20] 5 cmH20 Plateau Pressure:  [18 cmH20-24 cmH20] 24 cmH20   sodium chloride Stopped (06/19/2021 0918)   sodium chloride     sodium chloride     amiodarone 30 mg/hr (06/11/21 1700)   ceFEPime (MAXIPIME) IV 2 g (06/11/21 1741)   dexmedetomidine (PRECEDEX) IV infusion 0.5 mcg/kg/hr (06/11/21 1700)   epinephrine 5 mcg/min (06/11/21 1700)   feeding supplement (PIVOT 1.5 CAL) 1,000 mL (06/11/21 1147)   furosemide (LASIX) 200 mg in dextrose 5% 100 mL (2mg /mL) infusion 8 mg/hr (06/11/21 1700)   heparin 25 units/mL (Impella PURGE) in dextrose 5 % 1000 mL bag     heparin 250 Units/hr (06/11/21 1702)   HYDROmorphone 4 mg/hr (06/11/21 1747)   lactated ringers 10 mL/hr at 06/11/21 1700   lactated ringers     midazolam 4 mg/hr (06/11/21 1700)   milrinone 0.375 mcg/kg/min (06/11/21 1700)   norepinephrine (LEVOPHED) Adult infusion 28 mcg/min (06/11/21 1700)   vancomycin     vasopressin 0.02 Units/min (06/11/21 1715)    I/O last 3 completed shifts: In: 7381.4 [I.V.:2796.3; Blood:2708; Other:590; NG/GT:240; IV Piggyback:1047] Out: 6256 [Urine:1900; Emesis/NG output:1350; Blood:350; Chest Tube:1130]   CBC Latest Ref  Rng & Units 06/11/2021 06/11/2021 06/20/2021  WBC 4.0 - 10.5 K/uL - 8.5 -  Hemoglobin 12.0 - 15.0 g/dL 10.5(L) 11.0(L) 10.5(L)  Hematocrit 36.0 - 46.0 % 31.0(L) 32.3(L) 31.0(L)  Platelets 150 - 400 K/uL - 32(L) -    BMP Latest Ref Rng & Units 06/11/2021 06/11/2021 06/11/2021  Glucose 70 - 99 mg/dL 123(H) - 102(H)  BUN 6 - 20 mg/dL 18 - 16  Creatinine 0.44 - 1.00 mg/dL 2.25(H) - 1.88(H)  Sodium 135 - 145 mmol/L 134(L) 137 136  Potassium 3.5 - 5.1 mmol/L 4.3 4.6 4.5  Chloride 98 - 111 mmol/L 102 - 108  CO2 22 - 32 mmol/L 21(L) - 21(L)  Calcium 8.9 - 10.3 mg/dL 7.9(L) - 8.1(L)    ABG    Component Value Date/Time   PHART 7.324 (L) 06/11/2021 0542   PCO2ART 41.2 06/11/2021 0542   PO2ART 74 (L) 06/11/2021 0542   HCO3 21.4 06/11/2021 0542   TCO2 23 06/11/2021 0542   ACIDBASEDEF 4.0 (H) 06/11/2021 0542   O2SAT 93.0 06/11/2021 0542       Melodie Bouillon, MD 06/11/2021 6:12 PM

## 2021-06-11 NOTE — Progress Notes (Addendum)
Advanced Heart Failure Rounding Note   Subjective:    6/15 - LM coronary scad with emergent CABG  -> ECMO 6/17 - chest washout at bedside 6/19 - bronch due to RUL mucous plugging  6/20 - ECMO decannulation. Intra-op TEE EF ~35%   Decannulated from ECMO yesterday. Remains on Impella at P-7   On epi 3, NE 14, milrinone 0.25 and IV amio  MAPs 70. Co-ox 60%  CXR with worsening CHF. Weight up 20 pounds  Swan CVP 11 PA 29/16 PCWP 11  Thermo 4.2/2.2 SVR 1195  Objective:   Weight Range:  Vital Signs:   Temp:  [96.1 F (35.6 C)-99.5 F (37.5 C)] 99.5 F (37.5 C) (06/21 0745) Pulse Rate:  [79-105] 99 (06/21 0745) Resp:  [18] 18 (06/21 0742) BP: (65-106)/(55-82) 92/60 (06/21 0742) SpO2:  [94 %-100 %] 95 % (06/21 0745) Arterial Line BP: (62-119)/(56-87) 91/60 (06/21 0745) FiO2 (%):  [30 %-60 %] 60 % (06/21 0742) Weight:  [97.2 kg] 97.2 kg (06/21 0500) Last BM Date: 06/03/21  Weight change: Filed Weights   06/09/21 0443 05/28/2021 0500 06/11/21 0500  Weight: 97.3 kg 95.8 kg 97.2 kg    Intake/Output:   Intake/Output Summary (Last 24 hours) at 06/11/2021 0902 Last data filed at 06/11/2021 0800 Gross per 24 hour  Intake 5237.91 ml  Output 2660 ml  Net 2577.91 ml      Physical Exam: General:  Intubated/sedated HEENT: normal + ETT Neck: supple. RIJ swan . Carotids 2+ bilat; no bruits. No lymphadenopathy or thryomegaly appreciated. Cor: Chest open esmark in place +CTs. Regular Lungs: + crackles Abdomen: obese soft, nontender, + distended.  Extremities: no cyanosis, clubbing, rash, 3+ edema Neuro: intubated/sedated    Telemetry: NSR 80s Personally reviewed   Labs: Basic Metabolic Panel: Recent Labs  Lab 06/01/2021 0450 05/22/2021 1506 06/11/2021 2055 06/06/21 0012 06/06/21 0402 06/06/21 0605 06/06/21 1546 06/06/21 1557 06/09/21 0420 06/09/21 6734 06/09/21 1607 06/09/21 1947 05/25/2021 0322 06/09/2021 0515 06/06/2021 1422 05/30/2021 1425 06/09/2021 1504  06/02/2021 1649 06/14/2021 2020 06/11/21 0331 06/11/21 0542  NA 139   < > 144  142   < > 139   < > 141   < > 139   < > 138   < > 138   < > 137   < > 139 137 138 136 137  K 3.8   < > 3.1*  2.9*   < > 3.4*   < > 3.7   < > 4.9   < > 5.2*   < > 5.3*   < > 4.4   < > 4.3 4.2 4.8 4.5 4.6  CL 110   < > 112*  --  112*  --  113*   < > 111  --  112*  --  111  --  105  --   --  108  --  108  --   CO2 23  --  17*  --  17*  --  20*   < > 21*  --  22  --  23  --   --   --   --  22  --  21*  --   GLUCOSE 81   < > 207*  --  251*  --  178*   < > 117*  --  145*  --  134*  --  131*  --   --  124*  --  102*  --   BUN 16   < >  13  --  14  --  14   < > 12  --  12  --  14  --  14  --   --  13  --  16  --   CREATININE 1.50*   < > 1.76*  --  1.91*  --  1.90*   < > 1.49*  --  1.65*  --  1.69*  --  1.70*  --   --  1.64*  --  1.88*  --   CALCIUM 8.9  --  7.4*  --  7.3*  --  7.4*   < > 7.6*  --  7.5*  --  7.7*  --   --   --   --  8.2*  --  8.1*  --   MG 1.9  --  1.8  --  2.6*  --  2.0  --   --   --   --   --   --   --   --   --   --   --   --  1.7  --    < > = values in this interval not displayed.     Liver Function Tests: Recent Labs  Lab 06/09/2021 0232 06/08/21 0302 06/09/21 0420 06/06/2021 0322 06/11/21 0331  AST 212* 87* 56* 50* 74*  ALT 53* '16 9 7 8  ' ALKPHOS 22* 28* 39 45 62  BILITOT 0.7 1.1 0.7 0.6 0.8  PROT 4.2* 3.7* 4.0* 4.1* 4.6*  ALBUMIN 3.2* 2.4* 2.4* 2.1* 2.5*    No results for input(s): LIPASE, AMYLASE in the last 168 hours. No results for input(s): AMMONIA in the last 168 hours.  CBC: Recent Labs  Lab 06/09/21 1607 06/09/21 1947 05/28/2021 0322 06/06/2021 0515 06/16/2021 0921 05/29/2021 1422 06/03/2021 1504 06/11/2021 1649 06/15/2021 2020 06/11/21 0331 06/11/21 0542  WBC 7.7  --  8.4  --  9.2  --   --  8.4  --  8.5  --   HGB 8.0*   < > 7.5*   < > 8.8*   < > 8.5* 10.1* 10.5* 11.0* 10.5*  HCT 24.5*   < > 23.2*   < > 26.0*   < > 25.0* 30.0* 31.0* 32.3* 31.0*  MCV 91.1  --  93.2  --  89.7  --   --   87.0  --  87.8  --   PLT 53*  --  44*  --  40*  --   --  45*  --  32*  --    < > = values in this interval not displayed.     Cardiac Enzymes: No results for input(s): CKTOTAL, CKMB, CKMBINDEX, TROPONINI in the last 168 hours.  BNP: BNP (last 3 results) No results for input(s): BNP in the last 8760 hours.  ProBNP (last 3 results) No results for input(s): PROBNP in the last 8760 hours.    Other results:  Imaging: DG CHEST PORT 1 VIEW  Result Date: 06/11/2021 CLINICAL DATA:  Prior CABG.  Off of ECMO yesterday. EXAM: PORTABLE CHEST 1 VIEW COMPARISON:  05/24/2021. FINDINGS: Interval removal of ECMO device. Impella device noted with tip over left ventricle. Endotracheal tube, NG tube, right shunt tubing, mediastinal drainage catheters, left chest tube in stable position. Swan-Ganz catheter tip noted the pulmonary outflow tract. No pneumothorax. Stable cardiomegaly. New prominent bilateral pulmonary infiltrates and or edema noted on today's exam. Persist persistent bilateral subsegmental atelectasis. IMPRESSION: 1. Interval removal of ECMO device. Impella device noted  with tip over left ventricle. Ganz catheter tip noted over the pulmonary outflow tract. Remaining lines and tubes including left chest tube in stable position. No pneumothorax. 2.  Stable cardiomegaly. 3. New prominent bilateral pulmonary infiltrates and or edema noted on today's exam. Persistent bilateral subsegmental atelectasis. Electronically Signed   By: Marcello Moores  Register   On: 06/11/2021 07:56   DG Chest Port 1 View  Result Date: 06/09/2021 CLINICAL DATA:  Status post CABG. EXAM: PORTABLE CHEST 1 VIEW COMPARISON:  06/09/2021 and prior studies FINDINGS: Endotracheal tube tip 5 cm above the carina, RIGHT IJ Swan-Ganz catheter with tip overlying the RIGHT main pulmonary artery, NG tube entering the stomach with tip off the field of view,, mediastinal LEFT thoracostomy tubes, ECMO catheter and Impella device again noted.  Re-expansion of the RIGHT lung apex is noted. LEFT LOWER lung opacity/atelectasis again identified. There is no evidence of pneumothorax or large pleural effusion. IMPRESSION: Re-expansion of the RIGHT lung apex without other significant change. Electronically Signed   By: Margarette Canada M.D.   On: 06/06/2021 08:30   DG CHEST PORT 1 VIEW  Result Date: 06/09/2021 CLINICAL DATA:  Patient status post CABG procedure. EXAM: PORTABLE CHEST 1 VIEW COMPARISON:  Chest radiograph June 09, 2021. FINDINGS: ET tube mid trachea. PA catheter tip projects over the central pulmonary artery. ECMO catheters stable in position. Impella device redemonstrated. Left chest tube in place. Similar patchy airspace opacities left mid lower lung. Slight improved aeration of the right upper lung. IMPRESSION: Slight improved aeration of the upper lung. There is still however patchy consolidation suggestive of atelectasis. Left basilar heterogeneous opacities. Support apparatus as above. Electronically Signed   By: Lovey Newcomer M.D.   On: 06/09/2021 13:23   ECHO TEE  Result Date: 06/06/2021  *INTRAOPERATIVE TRANSESOPHAGEAL REPORT *  Patient Name:   [name] [name] Date of Exam: 06/16/2021 Medical Rec #:  [id number]       Height: Date of Birth:  01/15/1961        BSA: Patient Age:    33 years        BP:           147/68 mmHg Patient Gender: F               HR:           121 bpm. Exam Location:  Anesthesiology Transesophogeal exam was perform intraoperatively during surgical procedure. Patient was closely monitored under general anesthesia during the entirety of examination. Indications:     Coronary Artery Disease Sonographer:     Bernadene Person RDCS Performing Phys: Roberts Gaudy MD Diagnosing Phys: Roberts Gaudy MD Complications: No known complications during this procedure. PRE-OP FINDINGS  Left Ventricle: There was severe LV dysfunction. The inferior wall and inferior septum were contracting. The anterior wall, lateral wall and anterior septum  were akinetic. There was an impella cannula within the LV cavity and after adjustments were made, the inflow port was 3.3-3.5 cm proximal to the aortic valve. No thrombus was identified within the LV cavity. During attempted separation from cardiopulmonary bypass, the LV cavity was under filled and the anterior and lateral walls and anterior septum remained akinetic. Right Ventricle: There was moderate RV dysfunction. The basal RV free wall apeared to conteract normally. The apical anterior segments of the RV free wall were not contracting. During attempted separation from cardiopulmonary bypass, the RV became distended and hypocontractile with volume administration. Left Atrium: Left atrial cavity was dilated. No left atrial/left atrial appendage  thrombus was detected. Right Atrium: Right atrial size was dilated. Interatrial Septum: No atrial level shunt detected by color flow Doppler. Pericardium: There is no evidence of pericardial effusion. Mitral Valve: When the impella was functioning properly, the mitral leaflets were coaptating and there was moderate mitral regurgitaion with a central jet on color Doppler. When the impella became displaced with the inflow port above the aortic valve, the mitral leaflets remained open in both systole and diastole with resulting severe mitral regurgitaion. Tricuspid Valve: The tricuspid valve was normal in structure. Tricuspid valve regurgitation is moderate by color flow Doppler. No evidence of tricuspid stenosis is present. Aortic Valve: The aortic valve was tri-leaflet. There was an impella cannula acorss the aortic valve. There mild aortic insufficiency. The aortic valve leaflets were not opening. Pulmonic Valve: The pulmonic valve was normal in structure No evidence of pulmonic stenosis. Pulmonic valve regurgitation is trivial by color flow Doppler. Aorta: The ascending aorta and descending aorta showed no evidence of dissection.  Roberts Gaudy MD Electronically signed by  Roberts Gaudy MD Signature Date/Time: 06/06/2021/4:12:53 PM    Final    ECHO INTRAOPERATIVE TEE  Result Date: 06/03/2021  *INTRAOPERATIVE TRANSESOPHAGEAL REPORT *  Patient Name:   [name] [name] Date of Exam: 06/16/2021 Medical Rec #:  [id number]       Height:       63.0 in Accession #:    [id number]      Weight:       211.2 lb Date of Birth:  1961-03-14        BSA:          1.98 m Patient Age:    4 years        BP:           91/82 mmHg Patient Gender: F               HR:           96 bpm. Exam Location:  Inpatient Transesophogeal exam was perform intraoperatively during surgical procedure. Patient was closely monitored under general anesthesia during the entirety of examination. Indications:     DECANNULATION FOR ECMO (EXTRACORPOREAL MEMBRANE OXYGENATION) Performing Phys: [place] Diagnosing Phys: Roberts Gaudy MD Complications: No known complications during this procedure. PRE-OP FINDINGS  Left Ventricle: There was an impella cannula crossing the aortic valve with the inflow port 3.3 cm proximal to the aortic valve. The outflow port was visualized within the proximal ascending aorta 1 cm distal to the sinotubular junction. Upon insertion of the TEE probe, the patient was on ECMO support at 2 l/m and the impella was at [facility] with 1.8 l/m flow. The LV was underfilled. With weaning the ECMO flow and increasing impella support, LV filling improved and systolic function appeared improved from the TEE study of 6/15. The inferior wall and inferior septum were contracting normally. The basal and mid anterior walls were hypokinetic. The distal anterior wall, anterior septum and apex were akinetic. The LV ejection fraction was calculated at 30% with 2.5 l/m Impella flow. Right Ventricle: The right ventricle has normal systolic function. The cavity was normal. There is no increase in right ventricular wall thickness. Left Atrium: No left atrial/left atrial appendage thrombus was detected. Right Atrium: Right  atrial size was normal in size. Interatrial Septum: No atrial level shunt detected by color flow Doppler. There is no evidence of a patent foramen ovale. Pericardium: There was a large pericardial fluid collection seen in the anterior lateral pericardial  space. This measured 2 cm in diameter. This fluid was evacuated intraoperatively and there remained a trace amount of pericardial fluid seen. Mitral Valve: Mitral valve regurgitation is mild by color flow Doppler. There is No evidence of mitral stenosis. The mitral leaflets were mildly thickened. Tricuspid Valve: The tricuspid valve was normal in structure. Tricuspid valve regurgitation is trivial by color flow Doppler. No evidence of tricuspid stenosis is present. Aortic Valve: The aortic valve is tricuspid Aortic valve regurgitation is trivial by color flow Doppler. There was an impella cannula crossing the aortic valve. There was trace aortic insufficiency. The aortic valve leaflets were opening with each systole. Pulmonic Valve: The pulmonic valve was normal in structure, with normal. Pulmonic valve regurgitation is trivial by color flow Doppler. Aorta: There was no dissection seen in the ascending or descending aorta. The Impella cannula was identified within the ascending aorta. The descending aorrta was normal in diameter with the impella cannula was seen with the lumen. Shunts: There is no evidence of an atrial septal defect.  +------------------+---------++ LV Volumes (MOD)            +------------------+---------++ LV area d, A4C:   14.60 cm +------------------+---------++ LV area s, A4C:   13.70 cm +------------------+---------++ LV major d, A4C:  5.47 cm   +------------------+---------++ LV major s, A4C:  6.15 cm   +------------------+---------++ LV vol d, MOD A4C:31.4 ml   +------------------+---------++ LV vol s, MOD A4C:26.7 ml   +------------------+---------++ LV SV MOD A4C:    31.4 ml    +------------------+---------++  Roberts Gaudy MD Electronically signed by Roberts Gaudy MD Signature Date/Time: 05/31/2021/8:34:24 PM    Final      Medications:     Scheduled Medications:  aspirin  324 mg Per Tube Daily   chlorhexidine gluconate (MEDLINE KIT)  15 mL Mouth Rinse BID   Chlorhexidine Gluconate Cloth  6 each Topical Daily   docusate  200 mg Per Tube Daily   insulin aspart  1-3 Units Subcutaneous Q4H   insulin detemir  5 Units Subcutaneous Q12H   mouth rinse  15 mL Mouth Rinse 10 times per day   pantoprazole sodium  40 mg Per Tube Daily   rosuvastatin  40 mg Per Tube q1800   sennosides  10 mL Per Tube Daily   sodium chloride flush  10-40 mL Intracatheter Q12H   sodium chloride flush  3 mL Intravenous Q12H    Infusions:  sodium chloride Stopped (05/26/2021 0918)   sodium chloride     sodium chloride     albumin human 60 mL/hr at 06/11/21 0700   amiodarone 30 mg/hr (06/11/21 0700)    ceFAZolin (ANCEF) IV Stopped (06/11/21 0602)   dexmedetomidine (PRECEDEX) IV infusion 0.4 mcg/kg/hr (06/11/21 0700)   epinephrine 3 mcg/min (06/11/21 0700)   feeding supplement (PIVOT 1.5 CAL) Stopped (06/08/21 1845)   heparin 25 units/mL (Impella PURGE) in dextrose 5 % 1000 mL bag     HYDROmorphone 8 mg/hr (06/11/21 0700)   lactated ringers     lactated ringers 10 mL/hr at 06/11/21 0700   lactated ringers     midazolam 7 mg/hr (06/11/21 0700)   milrinone 0.375 mcg/kg/min (06/11/21 0811)   norepinephrine (LEVOPHED) Adult infusion 17 mcg/min (06/11/21 0700)   vasopressin Stopped (06/08/2021 2151)    PRN Medications: sodium chloride, sodium chloride, sodium chloride, albumin human, bisacodyl, HYDROmorphone, ipratropium-albuterol, lactated ringers, metoprolol tartrate, midazolam, ondansetron (ZOFRAN) IV, sodium chloride flush, sodium chloride flush   Assessment/Plan   Cardiogenic shock in setting of left  main dissection and emergent CABG - chest open on V-A ECMO with Impella vent. S/p  chest washout 6/17 - TEE 9/19 EF 35-40% - ECMO decannulated 6/20  - Impella at P-7. Flow 3.5 Waveforms ok. - On EPI 3 NE 14 milrinone 0.375 Outputs improved. Co-ox 60%  - Marked;y volume overloaded. Needs diuresis today  - Hopefully we can close chest later int he week once diuresed   2. CAD with acute due to left main dissection - s/p emergent CABG 05/24/2021 - No s/s angina - continue ASA/statin  3. Acute respiratory failure due to above - Oxygenation worse today in setting of increasing CHF - Diurese today with lasix 60IV bid - CCM managing vent  4 AKI - due to shock/ATN - SCr stabilized 1.7-1.8 today - continue support  5. Hypomag/hypokalemia - supp as needed  6. Anemia due to expected post-operative blood loss and ECMO circuit - transfuse to keep hgb >= 8.0.  8. Thrombocytopenia - due to critical illness. PLTs 45k -> 32k - has h/o Von Willebrands deficiency - has received low-dose DDAVP - likely due to critical illness/Impella - HIT sent    CRITICAL CARE Performed by: Glori Bickers  Total critical care time: 55 minutes  Critical care time was exclusive of separately billable procedures and treating other patients.  Critical care was necessary to treat or prevent imminent or life-threatening deterioration.  Critical care was time spent personally by me (independent of midlevel providers or residents) on the following activities: development of treatment plan with patient and/or surrogate as well as nursing, discussions with consultants, evaluation of patient's response to treatment, examination of patient, obtaining history from patient or surrogate, ordering and performing treatments and interventions, ordering and review of laboratory studies, ordering and review of radiographic studies, pulse oximetry and re-evaluation of patient's condition.    Length of Stay: 6   Glori Bickers MD 06/11/2021, 9:02 AM  Advanced Heart Failure Team Pager [phone]  (M-F; [place])  Please contact [facility] for night-coverage after hours (4p -7a ) and weekends on [email or web address]

## 2021-06-11 NOTE — Progress Notes (Addendum)
ANTICOAGULATION/ANTIBIOTIC CONSULT NOTE   Pharmacy Consult for IV Heparin and Vancomycin and Cefepime Indication:  Impella , afib, sepsis  No Known Allergies  Patient Measurements: Height: 5\' 3"  (160 cm) Weight: 97.2 kg (214 lb 4.6 oz) IBW/kg (Calculated) : 52.4 Heparin Dosing Weight: 72 kg  Vital Signs: Temp: 100.04 F (37.8 C) (06/21 1415) Temp Source: Core (06/21 0800) BP: 77/55 (06/21 1508) Pulse Rate: 107 (06/21 1508)  Labs: Recent Labs    06/09/21 0420 06/09/21 2694 06/09/21 1607 06/09/21 1947 06/14/2021 0322 06/14/2021 0323 05/26/2021 0515 05/30/2021 0921 06/03/2021 1422 05/31/2021 1425 05/24/2021 1649 06/08/2021 2020 06/11/21 0331 06/11/21 0542  HGB 8.3*   < > 8.0*   < > 7.5*  --    < > 8.8* 7.1*   < > 10.1* 10.5* 11.0* 10.5*  HCT 25.1*   < > 24.5*   < > 23.2*  --    < > 26.0* 21.0*   < > 30.0* 31.0* 32.3* 31.0*  PLT 71*  --  53*  --  44*  --   --  40*  --   --  45*  --  32*  --   APTT 102*  --   --   --  108*  --   --   --   --   --   --   --  50*  --   LABPROT 15.5*  --   --   --  15.7*  --   --   --   --   --   --   --  15.0  --   INR 1.2  --   --   --  1.3*  --   --   --   --   --   --   --  1.2  --   HEPARINUNFRC <0.10*  --  0.12*  --   --  0.13*  --   --   --   --   --   --  <0.10*  --   CREATININE 1.49*  --  1.65*  --  1.69*  --   --   --  1.70*  --  1.64*  --  1.88*  --    < > = values in this interval not displayed.     Estimated Creatinine Clearance: 35.3 mL/min (A) (by C-G formula based on SCr of 1.88 mg/dL (H)).   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 Cristopher Peru)   Thyroid disease    PROCEDURE FOR THYROID 15 YRS AGO AT [facility]   WPW (Wolff-Parkinson-White syndrome)     Medications:  Infusions:   sodium chloride  Stopped (06/06/2021 0918)   sodium chloride     sodium chloride     amiodarone 30 mg/hr (06/11/21 1500)    ceFAZolin (ANCEF) IV Stopped (06/11/21 1413)   dexmedetomidine (PRECEDEX) IV infusion 0.5 mcg/kg/hr (06/11/21 1500)   epinephrine 5 mcg/min (06/11/21 1500)   feeding supplement (PIVOT 1.5 CAL) 1,000 mL (06/11/21 1147)   furosemide (LASIX) 200 mg in dextrose 5% 100 mL (2mg /mL) infusion 8 mg/hr (06/11/21 1500)   heparin 25 units/mL (Impella PURGE) in dextrose 5 % 1000 mL bag     heparin  HYDROmorphone 6 mg/hr (06/11/21 1500)   lactated ringers 10 mL/hr at 06/11/21 1500   lactated ringers     midazolam 4 mg/hr (06/11/21 1500)   milrinone 0.375 mcg/kg/min (06/11/21 1500)   norepinephrine (LEVOPHED) Adult infusion 24 mcg/min (06/11/21 1500)   vasopressin Stopped (05/22/2021 2151)    Assessment:  36 yoF admitted with cardiogenic shock 2/2 LM SCAD. Pt s/p CABG with VA-ECMO and Impella placement. ECMO decannulated on 6/20.   Anticoagulation: Pt has heparin half-strength purge infusing, pltc continues to trend down. HIT panel sent. This morning, ECMO team made the decision not to titrate heparin.  6/21 p.m. Pt with afib and Dr. Lynetta Mare asks pharmacy to start systemic heparin - targeting heparin level ~0.3 - in case cardioversion is required. He is aware that HIT panel sent and would like to continue with heparin.  Half strength heparin purge infusing at 17.1 ml/hr (provides heparin 430 units). Heparin level this morning was undetectable.  ID: Pt currently on Ancef post open chest. To broaden antibiotics to Vancomycin and Cefepime for sepsis. Tm 100.8. WBC wnl. SCr 1.88.  Goal of Therapy:  Heparin level 0.2-0.5 units/ml (Dr. Lynetta Mare would like to keep ~0.3) Monitor platelets by anticoagulation protocol: Yes   Plan:  Continue half-strength heparinized purge (25 units/ml) Start systemic heparin 250 units/hr Heparin level 8 hours post start F/U HIT panel Cefepime 2gm IV  q12h Vancomycin 1250mg  IV q48h - will give first dose now (pt received 1250mg  IV ~24 hrs ago so this will be rest of load). Goal AUC 400-550. Expected AUC: 490 SCr used: 1.88, Vd coeff 0.5 Will f/u renal function, micro data, and pt's clinical condition Vanc levels prn  Sherlon Handing, PharmD, BCPS Please see amion for complete clinical pharmacist phone list 06/11/2021 1710  ADDENDUM: SCr up to 2.25, est CrCl 29 ml/min Will change Cefepime to 2gm IV Q24h Consider Vanc trough prior to next dose.  Sherlon Handing, PharmD, BCPS Please see amion for complete clinical pharmacist phone list 06/11/2021 7:17 PM

## 2021-06-11 NOTE — Progress Notes (Addendum)
Around 2130 Patient temp rose to 38.7C and patient started requiring more levophed. Increased dose to try and maintain MAP goals but patient started having lots of ectopy and runs of afib RVR. Patient then began coughing and gagging on ETT, provided patient with PRN medications and increased sedation medication drips. Sats dropped to mid 80's, BP dropped to 60's SBP, and RN suctioned ETT with minimal secretions out despite patient sounding very rhoncus. FiO2 increased to 80% and sats slowly began to rise to the 90's.   CCM and Bensimhon notified and updated. BMP sent. Orders received for amio bolus given slowly, d/t hypotension that occurred post admin earlier during the day, and stop lasix gtt for now. RN will restart lasix gtt around 0200 if patient's MAP remains stable in the 70's. At time of writing, patients BP has somewhat stabilized on 38 levo, 5 epi, 0.03 vaso, and 0.375 milrinone. BP 94/66(72), PAP 25/13, CVP 7, Sats 97%, temp 38.4C with ice packs applied.

## 2021-06-11 NOTE — Progress Notes (Signed)
Notified Dr. Haroldine Laws of UOP from 60mg  lasix only 427mL.  Orders for lasix gtt received and noted.

## 2021-06-11 NOTE — Progress Notes (Signed)
1610 patient in afib RVR rate 140-150s; not tolerating hemodynamically with SBPs in the 50s-60s.  Suction events on Impella; I decreased Impella from P7 to P6 and suction events resolved.  Dr. Haroldine Laws notified and coming to bedside for possible cardioversion.  Giving 150mg  bolus of amiodarone per Dr. Haroldine Laws. Patients BP dipped in to the 40s during amio bolus, pressors increased and SBPs began to rise.  61 Dr. Lynetta Mare to bedside, vaso gtt restarted, tylenol ordered and given for increasing fever, resp culture obtained and sent to lab per orders.  Lab to draw blood cx.

## 2021-06-11 NOTE — Progress Notes (Signed)
ANTICOAGULATION CONSULT NOTE   Pharmacy Consult for IV Heparin Indication:  Impella  No Known Allergies  Patient Measurements: Height: 5\' 3"  (160 cm) Weight: 97.2 kg (214 lb 4.6 oz) IBW/kg (Calculated) : 52.4 Heparin Dosing Weight: 72 kg  Vital Signs: Temp: 100.04 F (37.8 C) (06/21 1230) Temp Source: (P) Core (06/21 0800) BP: 86/68 (06/21 1200) Pulse Rate: 102 (06/21 1230)  Labs: Recent Labs    06/09/21 0420 06/09/21 6387 06/09/21 1607 06/09/21 1947 06/06/2021 0322 05/29/2021 0323 06/08/2021 0515 06/08/2021 0921 06/03/2021 1422 05/26/2021 1425 06/11/2021 1649 05/29/2021 2020 06/11/21 0331 06/11/21 0542  HGB 8.3*   < > 8.0*   < > 7.5*  --    < > 8.8* 7.1*   < > 10.1* 10.5* 11.0* 10.5*  HCT 25.1*   < > 24.5*   < > 23.2*  --    < > 26.0* 21.0*   < > 30.0* 31.0* 32.3* 31.0*  PLT 71*  --  53*  --  44*  --   --  40*  --   --  45*  --  32*  --   APTT 102*  --   --   --  108*  --   --   --   --   --   --   --  50*  --   LABPROT 15.5*  --   --   --  15.7*  --   --   --   --   --   --   --  15.0  --   INR 1.2  --   --   --  1.3*  --   --   --   --   --   --   --  1.2  --   HEPARINUNFRC <0.10*  --  0.12*  --   --  0.13*  --   --   --   --   --   --  <0.10*  --   CREATININE 1.49*  --  1.65*  --  1.69*  --   --   --  1.70*  --  1.64*  --  1.88*  --    < > = values in this interval not displayed.     Estimated Creatinine Clearance: 35.3 mL/min (A) (by C-G formula based on SCr of 1.88 mg/dL (H)).   Medical History: Past Medical History:  Diagnosis Date   Arthritis    Brain aneurysm    Diabetes mellitus without complication (HCC)    Headache    Hypertension        SVT (supraventricular tachycardia) ([place])    s/p radiofrequency catheter ablation for AVNRT 02/16/09 (Dr. Cristopher Peru)   Thyroid disease    PROCEDURE FOR THYROID 15 YRS AGO AT [facility]   WPW (Wolff-Parkinson-White syndrome)     Medications:  Infusions:   sodium chloride Stopped (06/09/2021 0918)   sodium chloride      sodium chloride     amiodarone 30 mg/hr (06/11/21 1232)    ceFAZolin (ANCEF) IV Stopped (06/11/21 0602)   dexmedetomidine (PRECEDEX) IV infusion 0.4 mcg/kg/hr (06/11/21 1200)   epinephrine 5 mcg/min (06/11/21 1233)   feeding supplement (PIVOT 1.5 CAL) 1,000 mL (06/11/21 1147)   heparin 25 units/mL (Impella PURGE) in dextrose 5 % 1000 mL bag     HYDROmorphone 8 mg/hr (06/11/21 1200)   lactated ringers     lactated ringers 10 mL/hr at 06/11/21 1200   lactated ringers  midazolam 4 mg/hr (06/11/21 1200)   milrinone 0.375 mcg/kg/min (06/11/21 1200)   norepinephrine (LEVOPHED) Adult infusion 20 mcg/min (06/11/21 1200)   vasopressin Stopped (05/27/2021 2151)    Assessment:  2 yoF admitted with cardiogenic shock 2/2 LM SCAD. Pt s/p CABG with VA-ECMO and Impella placement. ECMO decannulated on 6/20.   Pt has heparin half-strength purge infusing, pltc continues to trend down. HIT panel sent. Discussed with ECMO team - will not titrate heparin for now.  Goal of Therapy:  Heparin level 0.2-0.5  Monitor platelets by anticoagulation protocol: Yes   Plan:  -Continue half-strength heparinized purge (25 units/ml) -Check heparin level and CBC q12h -F/U HIT panel  [name], PharmD, BCPS, [facility] Clinical Pharmacist [phone] Please check AMION for all [facility] Pharmacy numbers 06/11/2021

## 2021-06-12 ENCOUNTER — Encounter (HOSPITAL_COMMUNITY)
Admission: EM | Disposition: E | Payer: Self-pay | Source: Home / Self Care | Attending: Thoracic Surgery (Cardiothoracic Vascular Surgery)

## 2021-06-12 ENCOUNTER — Inpatient Hospital Stay (HOSPITAL_COMMUNITY): Payer: 59

## 2021-06-12 ENCOUNTER — Encounter (HOSPITAL_COMMUNITY): Payer: Self-pay | Admitting: Certified Registered Nurse Anesthetist

## 2021-06-12 ENCOUNTER — Encounter (HOSPITAL_COMMUNITY): Payer: Self-pay | Admitting: Anesthesiology

## 2021-06-12 DIAGNOSIS — I9789 Other postprocedural complications and disorders of the circulatory system, not elsewhere classified: Secondary | ICD-10-CM

## 2021-06-12 DIAGNOSIS — I4891 Unspecified atrial fibrillation: Secondary | ICD-10-CM

## 2021-06-12 DIAGNOSIS — Z951 Presence of aortocoronary bypass graft: Secondary | ICD-10-CM | POA: Diagnosis not present

## 2021-06-12 DIAGNOSIS — R079 Chest pain, unspecified: Secondary | ICD-10-CM | POA: Diagnosis not present

## 2021-06-12 DIAGNOSIS — R570 Cardiogenic shock: Secondary | ICD-10-CM | POA: Diagnosis not present

## 2021-06-12 DIAGNOSIS — I2510 Atherosclerotic heart disease of native coronary artery without angina pectoris: Secondary | ICD-10-CM | POA: Diagnosis not present

## 2021-06-12 HISTORY — PX: STERNAL WOUND DEBRIDEMENT: SHX1058

## 2021-06-12 LAB — CBC
HCT: 28.1 % — ABNORMAL LOW (ref 36.0–46.0)
HCT: 24.6 % — ABNORMAL LOW (ref 36.0–46.0)
Hemoglobin: 9.2 g/dL — ABNORMAL LOW (ref 12.0–15.0)
Hemoglobin: 8.2 g/dL — ABNORMAL LOW (ref 12.0–15.0)
MCH: 29.3 pg (ref 26.0–34.0)
MCH: 30 pg (ref 26.0–34.0)
MCHC: 32.7 g/dL (ref 30.0–36.0)
MCHC: 33.3 g/dL (ref 30.0–36.0)
MCV: 89.5 fL (ref 80.0–100.0)
MCV: 90.1 fL (ref 80.0–100.0)
Platelets: 45 10*3/uL — ABNORMAL LOW (ref 150–400)
Platelets: 47 10*3/uL — ABNORMAL LOW (ref 150–400)
RBC: 3.14 MIL/uL — ABNORMAL LOW (ref 3.87–5.11)
RBC: 2.73 MIL/uL — ABNORMAL LOW (ref 3.87–5.11)
RDW: 16.1 % — ABNORMAL HIGH (ref 11.5–15.5)
RDW: 16.4 % — ABNORMAL HIGH (ref 11.5–15.5)
WBC: 13.3 10*3/uL — ABNORMAL HIGH (ref 4.0–10.5)
WBC: 14.7 10*3/uL — ABNORMAL HIGH (ref 4.0–10.5)
nRBC: 3.5 % — ABNORMAL HIGH (ref 0.0–0.2)
nRBC: 1.9 % — ABNORMAL HIGH (ref 0.0–0.2)

## 2021-06-12 LAB — POCT I-STAT 7, (LYTES, BLD GAS, ICA,H+H)
Acid-base deficit: 4 mmol/L — ABNORMAL HIGH (ref 0.0–2.0)
Bicarbonate: 21.5 mmol/L (ref 20.0–28.0)
Calcium, Ion: 1.15 mmol/L (ref 1.15–1.40)
HCT: 26 % — ABNORMAL LOW (ref 36.0–46.0)
Hemoglobin: 8.8 g/dL — ABNORMAL LOW (ref 12.0–15.0)
O2 Saturation: 99 %
Patient temperature: 37.8
Potassium: 4.3 mmol/L (ref 3.5–5.1)
Sodium: 135 mmol/L (ref 135–145)
TCO2: 23 mmol/L (ref 22–32)
pCO2 arterial: 40 mmHg (ref 32.0–48.0)
pH, Arterial: 7.343 — ABNORMAL LOW (ref 7.350–7.450)
pO2, Arterial: 174 mmHg — ABNORMAL HIGH (ref 83.0–108.0)

## 2021-06-12 LAB — COMPREHENSIVE METABOLIC PANEL
ALT: 6 U/L (ref 0–44)
AST: 75 U/L — ABNORMAL HIGH (ref 15–41)
Albumin: 2.4 g/dL — ABNORMAL LOW (ref 3.5–5.0)
Alkaline Phosphatase: 65 U/L (ref 38–126)
Anion gap: 9 (ref 5–15)
BUN: 20 mg/dL (ref 6–20)
CO2: 20 mmol/L — ABNORMAL LOW (ref 22–32)
Calcium: 7.8 mg/dL — ABNORMAL LOW (ref 8.9–10.3)
Chloride: 104 mmol/L (ref 98–111)
Creatinine, Ser: 2.7 mg/dL — ABNORMAL HIGH (ref 0.44–1.00)
GFR, Estimated: 20 mL/min — ABNORMAL LOW (ref >60)
Glucose, Bld: 141 mg/dL — ABNORMAL HIGH (ref 70–99)
Potassium: 4.3 mmol/L (ref 3.5–5.1)
Sodium: 133 mmol/L — ABNORMAL LOW (ref 135–145)
Total Bilirubin: 1 mg/dL (ref 0.3–1.2)
Total Protein: 4.8 g/dL — ABNORMAL LOW (ref 6.5–8.1)

## 2021-06-12 LAB — HEPARIN INDUCED PLATELET AB (HIT ANTIBODY): Heparin Induced Plt Ab: 0.092 OD (ref 0.000–0.400)

## 2021-06-12 LAB — COOXEMETRY PANEL
Carboxyhemoglobin: 0.7 % (ref 0.5–1.5)
Methemoglobin: 0.9 % (ref 0.0–1.5)
O2 Saturation: 54.5 %
Total hemoglobin: 12 g/dL (ref 12.0–16.0)

## 2021-06-12 LAB — BASIC METABOLIC PANEL
Anion gap: 10 (ref 5–15)
Anion gap: 10 (ref 5–15)
BUN: 21 mg/dL — ABNORMAL HIGH (ref 6–20)
BUN: 22 mg/dL — ABNORMAL HIGH (ref 6–20)
CO2: 20 mmol/L — ABNORMAL LOW (ref 22–32)
CO2: 22 mmol/L (ref 22–32)
Calcium: 7.6 mg/dL — ABNORMAL LOW (ref 8.9–10.3)
Calcium: 7.9 mg/dL — ABNORMAL LOW (ref 8.9–10.3)
Chloride: 103 mmol/L (ref 98–111)
Chloride: 102 mmol/L (ref 98–111)
Creatinine, Ser: 2.72 mg/dL — ABNORMAL HIGH (ref 0.44–1.00)
Creatinine, Ser: 2.82 mg/dL — ABNORMAL HIGH (ref 0.44–1.00)
GFR, Estimated: 19 mL/min — ABNORMAL LOW (ref >60)
GFR, Estimated: 19 mL/min — ABNORMAL LOW (ref >60)
Glucose, Bld: 135 mg/dL — ABNORMAL HIGH (ref 70–99)
Glucose, Bld: 146 mg/dL — ABNORMAL HIGH (ref 70–99)
Potassium: 3.8 mmol/L (ref 3.5–5.1)
Potassium: 4 mmol/L (ref 3.5–5.1)
Sodium: 133 mmol/L — ABNORMAL LOW (ref 135–145)
Sodium: 134 mmol/L — ABNORMAL LOW (ref 135–145)

## 2021-06-12 LAB — GLUCOSE, CAPILLARY
Glucose-Capillary: 121 mg/dL — ABNORMAL HIGH (ref 70–99)
Glucose-Capillary: 125 mg/dL — ABNORMAL HIGH (ref 70–99)
Glucose-Capillary: 135 mg/dL — ABNORMAL HIGH (ref 70–99)
Glucose-Capillary: 146 mg/dL — ABNORMAL HIGH (ref 70–99)
Glucose-Capillary: 153 mg/dL — ABNORMAL HIGH (ref 70–99)
Glucose-Capillary: 155 mg/dL — ABNORMAL HIGH (ref 70–99)
Glucose-Capillary: 160 mg/dL — ABNORMAL HIGH (ref 70–99)

## 2021-06-12 LAB — APTT: aPTT: 110 seconds — ABNORMAL HIGH (ref 24–36)

## 2021-06-12 LAB — LACTATE DEHYDROGENASE: LDH: 645 U/L — ABNORMAL HIGH (ref 98–192)

## 2021-06-12 LAB — MAGNESIUM: Magnesium: 2.2 mg/dL (ref 1.7–2.4)

## 2021-06-12 LAB — PROTIME-INR
INR: 1.5 — ABNORMAL HIGH (ref 0.8–1.2)
Prothrombin Time: 17.7 seconds — ABNORMAL HIGH (ref 11.4–15.2)

## 2021-06-12 LAB — FIBRINOGEN: Fibrinogen: 703 mg/dL — ABNORMAL HIGH (ref 210–475)

## 2021-06-12 LAB — HEPARIN LEVEL (UNFRACTIONATED): Heparin Unfractionated: 0.11 IU/mL — ABNORMAL LOW (ref 0.30–0.70)

## 2021-06-12 LAB — PATHOLOGIST SMEAR REVIEW

## 2021-06-12 SURGERY — DEBRIDEMENT, WOUND, STERNUM
Anesthesia: Choice

## 2021-06-12 MED ORDER — ETOMIDATE 2 MG/ML IV SOLN
20.0000 mg | Freq: Once | INTRAVENOUS | Status: AC
  Administered 2021-06-12: 20 mg via INTRAVENOUS
  Filled 2021-06-12: qty 10

## 2021-06-12 MED ORDER — SORBITOL 70 % SOLN
960.0000 mL | TOPICAL_OIL | Freq: Once | ORAL | Status: AC
  Administered 2021-06-12: 960 mL via RECTAL
  Filled 2021-06-12: qty 473

## 2021-06-12 MED ORDER — ALBUMIN HUMAN 5 % IV SOLN
12.5000 g | Freq: Once | INTRAVENOUS | Status: AC
  Administered 2021-06-12: 12.5 g via INTRAVENOUS

## 2021-06-12 MED ORDER — 0.9 % SODIUM CHLORIDE (POUR BTL) OPTIME
TOPICAL | Status: DC | PRN
  Administered 2021-06-12: 1000 mL

## 2021-06-12 MED ORDER — METOCLOPRAMIDE HCL 5 MG/ML IJ SOLN
5.0000 mg | Freq: Three times a day (TID) | INTRAMUSCULAR | Status: DC
  Administered 2021-06-12 – 2021-06-13 (×3): 5 mg via INTRAVENOUS
  Filled 2021-06-12 (×3): qty 2

## 2021-06-12 MED ORDER — MAGNESIUM CITRATE PO SOLN
1.0000 | Freq: Once | ORAL | Status: AC
  Administered 2021-06-12: 1 via ORAL
  Filled 2021-06-12: qty 296

## 2021-06-12 MED ORDER — POTASSIUM CHLORIDE 20 MEQ PO PACK
40.0000 meq | PACK | Freq: Once | ORAL | Status: AC
  Administered 2021-06-12: 40 meq
  Filled 2021-06-12: qty 2

## 2021-06-12 MED ORDER — POLYETHYLENE GLYCOL 3350 17 G PO PACK
17.0000 g | PACK | Freq: Two times a day (BID) | ORAL | Status: DC
  Administered 2021-06-12 – 2021-06-17 (×10): 17 g via ORAL
  Filled 2021-06-12 (×10): qty 1

## 2021-06-12 MED ORDER — SORBITOL 70 % SOLN
60.0000 mL | Freq: Once | Status: AC
  Administered 2021-06-12: 60 mL
  Filled 2021-06-12: qty 60

## 2021-06-12 MED ORDER — FUROSEMIDE 10 MG/ML IJ SOLN: 20.0000 mg | Freq: Once | INTRAMUSCULAR | Status: DC

## 2021-06-12 MED ORDER — POLYVINYL ALCOHOL 1.4 % OP SOLN
2.0000 [drp] | OPHTHALMIC | Status: DC | PRN
  Administered 2021-06-13: 2 [drp] via OPHTHALMIC
  Filled 2021-06-12: qty 15

## 2021-06-12 MED ORDER — VECURONIUM BROMIDE 10 MG IV SOLR
10.0000 mg | Freq: Once | INTRAVENOUS | Status: AC
  Administered 2021-06-12: 10 mg via INTRAVENOUS
  Filled 2021-06-12: qty 10

## 2021-06-12 MED ORDER — COLCHICINE 0.3 MG HALF TABLET
0.3000 mg | ORAL_TABLET | Freq: Every day | ORAL | Status: DC
  Administered 2021-06-12 – 2021-06-14 (×3): 0.3 mg
  Filled 2021-06-12 (×4): qty 1

## 2021-06-12 SURGICAL SUPPLY — 45 items
BANDAGE ESMARK 6X9 LF (GAUZE/BANDAGES/DRESSINGS) IMPLANT
BLADE CLIPPER SURG (BLADE) ×1 IMPLANT
BLADE SURG 10 STRL SS (BLADE) ×3 IMPLANT
BNDG CMPR 9X6 STRL LF SNTH (GAUZE/BANDAGES/DRESSINGS) ×1
BNDG ESMARK 6X9 LF (GAUZE/BANDAGES/DRESSINGS) ×3
CANISTER SUCT 3000ML PPV (MISCELLANEOUS) ×3 IMPLANT
CNTNR URN SCR LID CUP LEK RST (MISCELLANEOUS) IMPLANT
CONT SPEC 4OZ STRL OR WHT (MISCELLANEOUS)
COUNTER NEEDLE 20 DBL MAG RED (NEEDLE) ×3 IMPLANT
DRAPE LAPAROSCOPIC ABDOMINAL (DRAPES) ×3 IMPLANT
DRAPE WARM FLUID 44X44 (DRAPES) IMPLANT
DRSG PAD ABDOMINAL 8X10 ST (GAUZE/BANDAGES/DRESSINGS) IMPLANT
ELECT REM PT RETURN 9FT ADLT (ELECTROSURGICAL)
ELECTRODE REM PT RTRN 9FT ADLT (ELECTROSURGICAL) ×1 IMPLANT
GAUZE SPONGE 4X4 12PLY STRL (GAUZE/BANDAGES/DRESSINGS) ×3 IMPLANT
GLOVE SURG SIGNA 7.5 PF LTX (GLOVE) ×6 IMPLANT
GOWN STRL REUS W/ TWL LRG LVL3 (GOWN DISPOSABLE) ×2 IMPLANT
GOWN STRL REUS W/ TWL XL LVL3 (GOWN DISPOSABLE) ×1 IMPLANT
GOWN STRL REUS W/TWL LRG LVL3 (GOWN DISPOSABLE) ×6
GOWN STRL REUS W/TWL XL LVL3 (GOWN DISPOSABLE) ×3
HANDPIECE INTERPULSE COAX TIP (DISPOSABLE)
HEMOSTAT POWDER SURGIFOAM 1G (HEMOSTASIS) IMPLANT
KIT BASIN OR (CUSTOM PROCEDURE TRAY) ×3 IMPLANT
KIT TURNOVER KIT B (KITS) ×3 IMPLANT
NS IRRIG 1000ML POUR BTL (IV SOLUTION) ×6 IMPLANT
PACK CHEST (CUSTOM PROCEDURE TRAY) ×3 IMPLANT
PAD ARMBOARD 7.5X6 YLW CONV (MISCELLANEOUS) ×2 IMPLANT
POWDER SURGICEL 3.0 GRAM (HEMOSTASIS) ×3 IMPLANT
SET HNDPC FAN SPRY TIP SCT (DISPOSABLE) IMPLANT
SPONGE LAP 18X18 RF (DISPOSABLE) ×5 IMPLANT
SUT STEEL 6MS V (SUTURE) IMPLANT
SUT STEEL STERNAL CCS#1 18IN (SUTURE) IMPLANT
SUT STEEL SZ 6 DBL 3X14 BALL (SUTURE) IMPLANT
SUT VIC AB 1 CTX 36 (SUTURE) ×6
SUT VIC AB 1 CTX36XBRD ANBCTR (SUTURE) ×2 IMPLANT
SUT VIC AB 2-0 CTX 27 (SUTURE) ×6 IMPLANT
SUT VIC AB 3-0 X1 27 (SUTURE) ×6 IMPLANT
SWAB COLLECTION DEVICE MRSA (MISCELLANEOUS) IMPLANT
SWAB CULTURE ESWAB REG 1ML (MISCELLANEOUS) IMPLANT
SYR 5ML LL (SYRINGE) IMPLANT
SYR BULB IRRIG 60ML STRL (SYRINGE) ×2 IMPLANT
TOWEL GREEN STERILE (TOWEL DISPOSABLE) ×3 IMPLANT
TOWEL GREEN STERILE FF (TOWEL DISPOSABLE) ×3 IMPLANT
TRAY FOLEY MTR SLVR 14FR STAT (SET/KITS/TRAYS/PACK) IMPLANT
WATER STERILE IRR 1000ML POUR (IV SOLUTION) ×3 IMPLANT

## 2021-06-12 NOTE — Op Note (Signed)
NAME: [name] [name], [name] [name] MEDICAL RECORD NO: [id number] ACCOUNT NO: [id number] DATE OF BIRTH: 13-Apr-1961 FACILITY: [facility] LOCATION: [facility] PHYSICIAN: Revonda Standard. Roxan Hockey, MD  Operative Report   DATE OF PROCEDURE: 06/09/2021  PREOPERATIVE DIAGNOSIS:  Cardiac tamponade.  POSTOPERATIVE DIAGNOSIS:  Cardiac tamponade.  PROCEDURE:  Dressing change and evacuation of mediastinal clot under anesthesia.  SURGEON:  Revonda Standard. Roxan Hockey, MD  ASSISTANT:  None.  ANESTHESIA:  General.  BLOOD LOSS:  Minimal.  FINDINGS:  Extensive clot in the anterior mediastinum.  Improved hemodynamics post-removal of the clot.  CLINICAL NOTE:  [name] is a 60 year old woman, recently had left main dissection with cardiogenic shock and hemodynamic collapse and cardiac arrest.  She had emergent coronary artery bypass grafting, but required extracorporeal membrane circulation  and oxygenation to wean from bypass.  She has been decannulated from ECMO, still has an Impella in place.  Her sternum has not yet been closed.  Overnight, she had worsening hemodynamics and was noted to have increasing clot under the Esmarch dressing over  the sternal wound.  Today, the patient was showing signs of cardiac tamponade.  DESCRIPTION OF PROCEDURE:  [name] was already intubated and sedated.  She was given additional sedation by Dr. Lynetta Mare of critical care.  The chest was prepped and draped.  Timeout was performed.  The Esmarch dressing was removed.  There was a  large amount of clot in the anterior mediastinum.  This clot was evacuated.  There was improved hemodynamics after doing so. The tubes were suctioned to clear clot within them and then placed back into their locations.  Chest was copiously irrigated with  warm saline.  There were no active bleeding sites identified.  Topical spray Gelfoam was applied to the wound.  An Esmarch dressing was cut to fit and then sewn to the edges of the wound with 3-0 Prolene sutures.  An  Ioban dressing was applied.  The  patient remained in the ICU postoperatively.  All sponge, needle, and instrument counts were correct.   ROH D: 06/16/2021 3:27:46 pm T: 06/03/2021 3:46:00 pm  JOB: [id number]/ [id number]

## 2021-06-12 NOTE — Procedures (Signed)
Cortrak  Tube Type:  Cortrak - 43 inches Tube Location:  Right nare Initial Placement:  Postpyloric Secured by: Bridle Technique Used to Measure Tube Placement:  Marking at nare/corner of mouth Cortrak Secured At:  80 cm Cortrak Tube Team Note:  Consult received to place a Cortrak feeding tube.   X-ray is required, abdominal x-ray has been ordered by the Cortrak team. Please confirm tube placement before using the Cortrak tube.   If the tube becomes dislodged please keep the tube and contact the Cortrak team at [email or web address] (password [email or web address]) for replacement.  If after hours and replacement cannot be delayed, place a NG tube and confirm placement with an abdominal x-ray.    Koleen Distance MS, RD, LDN Please refer to [facility] for RD and/or RD on-call/weekend/after hours pager

## 2021-06-12 NOTE — Brief Op Note (Addendum)
06/01/2021  3:22 PM  PATIENT:  [name] [name]  60 y.o. female  PRE-OPERATIVE DIAGNOSIS:  Cardiac tamponade  POST-OPERATIVE DIAGNOSIS:  Cardiac tamponade  PROCEDURE: Dressing change, evacuation of mediastinal clot under anesthesia  SURGEON:  Surgeon(s) and Role:    Melrose Nakayama, MD - Primary  PHYSICIAN ASSISTANT:   ASSISTANTS: none   ANESTHESIA:   general  EBL:  minimal   BLOOD ADMINISTERED:none  DRAINS:  3  Chest Tube(s) in the same position    LOCAL MEDICATIONS USED:  NONE  SPECIMEN:  No Specimen  DISPOSITION OF SPECIMEN:  N/A  COUNTS:  YES  TOURNIQUET:  * No tourniquets in log *  DICTATION: .Other Dictation: Dictation Number -  PLAN OF CARE:  already inpatient  PATIENT DISPOSITION:  ICU - intubated and critically ill.   Delay start of Pharmacological VTE agent (>24hrs) due to surgical blood loss or risk of bleeding: not applicable

## 2021-06-12 NOTE — Progress Notes (Signed)
Nutrition Follow-up  DOCUMENTATION CODES:   Not applicable  INTERVENTION:   Recommend initiation of TPN given inadequate nutrition (essentially none) x 8 days. TF not restarted at moment, awaiting BM. Pt has not tolerated trickle TF thus far. Discussed with MDs.   Recommend advancement of Cortrak-recommend seeing if radiology can advance into postpyloric region  Tube Feeding Recommendations once either Cortrak tube advanced or patient has BM: Pivot 1.5 at 20 ml/hr , Goal rate of 60 ml/hr Provides 2160 kcals, 135 g of protein and 1094 mL of free water    NUTRITION DIAGNOSIS:   Inadequate oral intake related to acute illness as evidenced by NPO status.  Continues  GOAL:   Patient will meet greater than or equal to 90% of their needs  Not Met  MONITOR:   Vent status, TF tolerance, Labs, Weight trends  REASON FOR ASSESSMENT:   Consult, Ventilator Enteral/tube feeding initiation and management (ECMO)  ASSESSMENT:   60 yo female admitted with chest pain with STEMI requiring emergent CABG, cardiogenic shock in setting of left main dissection requiring VA ECMO, intubation PMH includes DM, HTN, HLD, CVA  Pt remains sedated on vent support, chest remains open Plan for bedside washout today. Impella in place  Remains on levophed, vasopressin and epinpehrine. Sedated on precedex, versed and hydromorphone.   Pt did not tolerate re-initiation of trickle TF via gastric OG yesterday. Pt with bile coming from mouth overnight; OG-LIS with 1200 mL output  Cortrak tube placed today but unable to obtain post pyloric positioning. Discussed possibility of advancement by radiology with MD; MD wanting to given reglan and reassess later to see if tube will pass on its own.   Pt with no BM since admission; receiving SMOG enema, reglan started. Pt receiving miralax and colaced. Noted dulocolax prn   Labs: Creatinine 2.70, BUN wdl Meds: reviewed  Diet Order:   Diet Order              Diet NPO time specified  Diet effective now                   EDUCATION NEEDS:   Not appropriate for education at this time  Skin:  Skin Assessment: Reviewed RN Assessment  Last BM:  6/13  Height:   Ht Readings from Last 1 Encounters:  05/30/2021 '5\' 3"'  (1.6 m)    Weight:   Wt Readings from Last 1 Encounters:  05/29/2021 96.9 kg     BMI:  Body mass index is 37.84 kg/m.  Estimated Nutritional Needs:   Kcal:  2100-2300 kcals  Protein:  125-140 g  Fluid:  >/= 1.9 L   Kerman Passey MS, RDN, LDN, CNSC Registered Dietitian III Clinical Nutrition RD Pager and On-Call Pager Number Located in [place]

## 2021-06-12 NOTE — Progress Notes (Signed)
ECMO PROGRESS NOTE  NAME:  [name] [name], MRN:  [id number], DOB:  1961/07/08, LOS: 7 ADMISSION DATE:  05/30/2021, CONSULTATION DATE: 05/22/2021 REFERRING MD: Roxan Hockey, CHIEF COMPLAINT: VA ECMO  HPI/course in hospital   60 year old woman with hx of HTN, HLD, hypothyroidism, CVA, DM2 who presented with angina.  Underwent cardiac cath which showed left main dissection, during this procedure became acutely unstable with crushing chest pain and shock.  Taken emergently to OR for bypass after 3-5 impella placed.  Tough to wean from bypass so left centrally cannulated on VA ECMO and sent to [place].  PCCM consulted to help with management.  Past Medical History   Past Medical History:  Diagnosis Date   Arthritis    Brain aneurysm    Diabetes mellitus without complication (HCC)    Headache    Hypertension        SVT (supraventricular tachycardia) ([place])    s/p radiofrequency catheter ablation for AVNRT 02/16/09 (Dr. Cristopher Peru)   Thyroid disease    PROCEDURE FOR THYROID 15 YRS AGO AT [facility]   WPW (Wolff-Parkinson-White syndrome)      Past Surgical History:  Procedure Laterality Date   BREAST SURGERY     BX RT BREAST  BENIGN   CANNULATION FOR ECMO (EXTRACORPOREAL MEMBRANE OXYGENATION) N/A 06/09/2021   Procedure: DECANNULATION FOR ECMO (EXTRACORPOREAL MEMBRANE OXYGENATION);  Surgeon: Melrose Nakayama, MD;  Location: [place];  Service: Open Heart Surgery;  Laterality: N/A;   CORONARY ARTERY BYPASS GRAFT N/A 05/23/2021   Procedure: CORONARY ARTERY BYPASS GRAFTING (CABG)X2.USING  LEFT INTERNAL MAMMARY ARTERY AND  RIGHT ENDOSCOPIC SAPHENOUS VEIN HARVESTING. ECMO INSERTION;  Surgeon: Melrose Nakayama, MD;  Location: [place];  Service: Open Heart Surgery;  Laterality: N/A;   CRANIOTOMY N/A 01/04/2016   Procedure: Suboccipital Craniotomy and Cervical one Laminectomy for Clipping of Aneurysm;  Surgeon: Kevan Ny Ditty, MD;  Location: [place] NEURO ORS;  Service: Neurosurgery;  Laterality: N/A;    CRANIOTOMY N/A 07/09/2016   Procedure: Craniotomy for clipping of left middle cerebral artery aneurysm;  Surgeon: Kevan Ny Ditty, MD;  Location: [place] NEURO ORS;  Service: Neurosurgery;  Laterality: N/A;  Craniotomy for 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 Christella Noa;  Surgeon: Kevan Ny Ditty, MD;  Location: [place];  Service: Neurosurgery;  Laterality: Right;   ELECTROPHYSIOLOGIC STUDY     LAPAROSCOPIC REVISION VENTRICULAR-PERITONEAL (V-P) SHUNT N/A 02/04/2016   Procedure: LAPAROSCOPIC Insertion VENTRICULAR-PERITONEAL (V-P) SHUNT;  Surgeon: Rolm Bookbinder, MD;  Location: [place] NEURO ORS;  Service: General;  Laterality: N/A;   LEFT HEART CATH AND CORONARY ANGIOGRAPHY N/A 05/30/2021   Procedure: LEFT HEART CATH AND CORONARY ANGIOGRAPHY;  Surgeon: Jettie Booze, MD;  Location: [place] CV LAB;  Service: Cardiovascular;  Laterality: N/A;   RADIOLOGY WITH ANESTHESIA N/A 01/03/2016   Procedure: RADIOLOGY WITH ANESTHESIA;  Surgeon: Medication Radiologist, MD;  Location: [place];  Service: Radiology;  Laterality: N/A;   STERNAL WOUND DEBRIDEMENT N/A 06/01/2021   Procedure: Reexploration and evacuation of hematoma;  Surgeon: Melrose Nakayama, MD;  Location: [place];  Service: Thoracic;  Laterality: N/A;   TEE WITHOUT CARDIOVERSION N/A 06/08/2021   Procedure: TRANSESOPHAGEAL ECHOCARDIOGRAM (TEE);  Surgeon: Melrose Nakayama, MD;  Location: [place];  Service: Open Heart Surgery;  Laterality: N/A;   VENTRICULAR ASSIST DEVICE INSERTION N/A 05/26/2021   Procedure: VENTRICULAR ASSIST DEVICE INSERTION;  Surgeon: Jettie Booze, MD;  Location: [place] CV LAB;  Service: Cardiovascular;  Laterality: N/A;   VENTRICULOPERITONEAL SHUNT Right 02/04/2016   Procedure: SHUNT INSERTION VENTRICULAR-PERITONEAL With Laparoscopic Assistance;  Surgeon: Kevan Ny Ditty, MD;   Location: [place] NEURO ORS;  Service: Neurosurgery;  Laterality: Right;     Significant Hospital events:  6/14 admitted 6/15 spontaneous artery dissection left main on cath.CP Impella placed.   6/15 emergent coronary artery bypass graft, LIMA to LAD, SVG to OM.  Brief cardiac arrest of less than 10 minutes immediately prior to initiation of cardiopulmonary bypass. 6/15 unable to wean.  Still unable to wean from bypass so placed on centrally cannulated VA ECMO. 6/16 EEG shows encephalopathy.  6/16 Cold right hand - seen by vascular, conservative management 6/17 Hand improved. 777m clot removed with washout at the bedside  6/19 RUL collapse - underwent bronchoscopy and aspiration of RUL mucus plug.  6/20 ECMO Decannulation 6/21>> BIS monitoring started , weaning of sedation, wet CXR 6/22 bedside chest washout; started on aggressive laxatives for severe constipation.  Interim history/subjective:   Continues to require high vasopressors.  Started on antibiotics yesterday.  No clear source of infection.   Objective   Blood pressure 93/72, pulse 82, temperature 99.14 F (37.3 C), resp. rate (!) 22, height _0  (1.6 m), weight 96.9 kg, SpO2 (!) 71 %. PAP: (21-35)/(12-25) 31/20 CVP:  [5 mmHg-17 mmHg] 13 mmHg PCWP:  [15 mmHg-21 mmHg] 15 mmHg CO:  [3.3 L/min-4.5 L/min] 3.3 L/min CI:  [1.7 L/min/m2-2.3 L/min/m2] 1.7 L/min/m2  Vent Mode: PRVC FiO2 (%):  [60 %-100 %] 90 % Set Rate:  [18 bmp-22 bmp] 22 bmp Vt Set:  [410 mL] 410 mL PEEP:  [5 cmH20-8 cmH20] 8 cmH20 Plateau Pressure:  [23 cmH20-26 cmH20] 25 cmH20   Intake/Output Summary (Last 24 hours) at 05/25/2021 1429 Last data filed at 06/09/2021 1300 Gross per 24 hour  Intake 4190.24 ml  Output 4635 ml  Net -444.76 ml    Filed Weights   06/15/2021 0500 06/11/21 0500 06/02/2021 0500  Weight: 95.8 kg 97.2 kg 96.9 kg              Physical Exam Constitutional:      Appearance: She is obese. Open chest HENT:     Head: Normocephalic.   Eyes:     Comments: sclerae intact Neck:     Comments: ET tube and OG tube in place Cardiovascular:     Rate and Rhythm: Intermittent atrial fibrillation    Heart sounds: Heart sounds obscured by chest dressing    Comments: Open chest with chest tubes, [place] tense with serosanguineous fluid.,  All extremities warm. Mild generalized edema. Pulmonary:     Breath sounds: Rhonchi throughout, diminished per bases    Comments: Mechanically ventilated with acceptable airway pressures.  Abdominal:     Palpations: Abdomen is soft.  Acceptable OG output.  No bowel movement.   Skin:    General: Skin is warm and dry.     Capillary Refill: Capillary refill takes 2 to 3 seconds.  Neurological:     Comments: Sedated.  No movement to voice or spontaneously  Ancillary tests (personally reviewed)  CBC: Recent Labs  Lab 06/15/2021 0322 06/11/2021 0515 05/31/2021 0921 05/24/2021 1422 05/27/2021 1649 06/11/2021 2020 06/11/21 0331 06/11/21 0542 06/11/21 1850 06/11/21 2126 06/19/2021 0330 06/08/2021 0459  WBC 8.4  --  9.2  --  8.4  --  8.5  --   --   --  13.3*  --  HGB 7.5*   < > 8.8*   < > 10.1*   < > 11.0* 10.5* 9.5* 9.5* 9.2* 8.8*  HCT 23.2*   < > 26.0*   < > 30.0*   < > 32.3* 31.0* 28.0* 28.0* 28.1* 26.0*  MCV 93.2  --  89.7  --  87.0  --  87.8  --   --   --  89.5  --   PLT 44*  --  40*  --  45*  --  32*  --   --   --  45*  --    < > = values in this interval not displayed.     Basic Metabolic Panel: Recent Labs  Lab 06/15/2021 2055 06/06/21 0012 06/06/21 0402 06/06/21 3354 06/06/21 1546 06/06/21 1557 06/06/2021 1649 06/09/2021 2020 06/11/21 0331 06/11/21 0542 06/11/21 1652 06/11/21 1850 06/11/21 2120 06/11/21 2126 05/26/2021 0330 06/02/2021 0459  NA 144  142   < > 139   < > 141   < > 137   < > 136   < > 134* 135 135 135 133* 135  K 3.1*  2.9*   < > 3.4*   < > 3.7   < > 4.2   < > 4.5   < > 4.3 4.5 4.7 4.6 4.3 4.3  CL 112*  --  112*  --  113*   < > 108  --  108  --  102  --  103  --  104   --   CO2 17*  --  17*  --  20*   < > 22  --  21*  --  21*  --  21*  --  20*  --   GLUCOSE 207*  --  251*  --  178*   < > 124*  --  102*  --  123*  --  127*  --  141*  --   BUN 13  --  14  --  14   < > 13  --  16  --  18  --  19  --  20  --   CREATININE 1.76*  --  1.91*  --  1.90*   < > 1.64*  --  1.88*  --  2.25*  --  2.45*  --  2.70*  --   CALCIUM 7.4*  --  7.3*  --  7.4*   < > 8.2*  --  8.1*  --  7.9*  --  7.9*  --  7.8*  --   MG 1.8  --  2.6*  --  2.0  --   --   --  1.7  --   --   --   --   --  2.2  --    < > = values in this interval not displayed.    GFR: Estimated Creatinine Clearance: 24.6 mL/min (A) (by C-G formula based on SCr of 2.7 mg/dL (H)). Recent Labs  Lab 06/06/21 0402 06/06/21 1810 05/27/2021 0232 06/16/2021 0919 06/06/2021 0323 06/11/2021 0921 05/23/2021 1649 06/11/21 0331 06/08/2021 0330  WBC 9.9   < > 7.8   < >  --  9.2 8.4 8.5 13.3*  LATICACIDVEN 5.1*  --  1.2  --  0.7 0.9  --   --   --    < > = values in this interval not displayed.     Liver Function Tests: Recent Labs  Lab 06/08/21 0302 06/09/21 0420 06/11/2021 0322 06/11/21 0331 05/30/2021  0330  AST 87* 56* 50* 74* 75*  ALT _0 ALKPHOS 28* 39 45 62 65  BILITOT 1.1 0.7 0.6 0.8 1.0  PROT 3.7* 4.0* 4.1* 4.6* 4.8*  ALBUMIN 2.4* 2.4* 2.1* 2.5* 2.4*    No results for input(s): LIPASE, AMYLASE in the last 168 hours. No results for input(s): AMMONIA in the last 168 hours.  ABG    Component Value Date/Time   PHART 7.343 (L) 06/08/2021 0459   PCO2ART 40.0 06/02/2021 0459   PO2ART 174 (H) 05/22/2021 0459   HCO3 21.5 05/24/2021 0459   TCO2 23 06/11/2021 0459   ACIDBASEDEF 4.0 (H) 05/27/2021 0459   O2SAT 99.0 06/01/2021 0459      Coagulation Profile: Recent Labs  Lab 06/08/21 0302 06/09/21 0420 06/06/2021 0322 06/11/21 0331 05/24/2021 0330  INR 1.4* 1.2 1.3* 1.2 1.5*     Cardiac Enzymes: No results for input(s): CKTOTAL, CKMB, CKMBINDEX, TROPONINI in the last 168 hours.  HbA1C: Hgb A1c MFr  Bld  Date/Time Value Ref Range Status  06/15/2021 10:03 AM 5.4 4.8 - 5.6 % Final    Comment:    (NOTE)         Prediabetes: 5.7 - 6.4         Diabetes: >6.4         Glycemic control for adults with diabetes: <7.0     CBG: Recent Labs  Lab 06/11/21 1949 06/09/2021 0035 05/26/2021 0326 06/20/2021 0908 05/25/2021 1243  GLUCAP 127* 146* 155* 125* 121*   CXR: Bilateral effusions.  Left lower lobe segmental collapse, retrocardiac. KUB is nondiagnostic  Assessment & Plan:  Critically ill due to cardiogenic shock secondary to spontaneous coronary dissection of the left main requiring VA ECMO support and titration of vasopressors and inotropes. CP Impella to offload LV. Status post two-vessel bypass Ongoing slow bleeding due to platelet dysfunction.  RUL collapse AKI due to hypotension -has worsened today following diuresis Shock liver due to hypotension - improving Multifactorial thrombocytopenia - improving Status post PEA cardiac arrest Uncontrolled type 2 diabetes Hypo mag  Plan: -Continue current level of Impella support -Wean vasopressors as tolerated to keep MAP greater than 65 -Continue empiric antibiotics.  Follow cultures negative so far. -For bedside washout today.  May help with atrial fibrillation and hemodynamics. -Continue amiodarone IV. -Start aggressive bowel regimen with Reglan, MiraLAX and senna.  Small bowel feeding tube attempted.  Unable to pass pylorus.  We will rex-ray to see if spontaneously migrates with peristalsis and initiate small bowel feeds.  Also may tolerate trickle feeds.   Daily Goals Checklist  Pain/Anxiety/Delirium protocol (if indicated): Dilaudid, Precedex and Versed to keep RASS goal -2 VAP protocol (if indicated): Bundle in place Respiratory support goals: Rest ventilator settings.  No weaning at this time. Blood pressure target: MAP greater than 65, normal cardiac output. DVT prophylaxis: On systemic heparin for VA ECMO - maintain higher  level of anticoagulation during weaning.  Nutritional status and feeding goals: . TF restart 6/21 GI prophylaxis: Pantoprazole.  Fluid status goals: Diuresis 6/21 for edema on CXR Urinary catheter: Assessment of intravascular volume Central lines: Right IJ MAC introducer, left radial art line Glucose control:  CBG and  [place] insulin.  Mobility/therapy needs: Bedrest Antibiotic de-escalation: Perioperative antibiotics Home medication reconciliation: On hold Daily labs: Per ECMO protocol Code Status: Full code Family Communication: No family at bedside Disposition: ICU  CRITICAL CARE Performed by: Kipp Brood  Total critical care time: 18mnutes  Critical care time was  exclusive of separately billable procedures and treating other patients.  Critical care was necessary to treat or prevent imminent or life-threatening deterioration.  Critical care was time spent personally by me on the following activities: development of treatment plan with patient and/or surrogate as well as nursing, discussions with consultants, evaluation of patient's response to treatment, examination of patient, obtaining history from patient or surrogate, ordering and performing treatments and interventions, ordering and review of laboratory studies, ordering and review of radiographic studies, pulse oximetry, re-evaluation of patient's condition and participation in multidisciplinary rounds.  Kipp Brood, MD [facility] ICU Physician [place]  Pager: [phone] Or Epic Secure Chat After hours: [phone].  06/11/2021, 2:44 PM      05/23/2021, 2:29 PM

## 2021-06-12 NOTE — Progress Notes (Signed)
[name] [name] for IV Heparin Indication:  Impella , Afib  No Known Allergies  Patient Measurements: Height: 5\' 3"  (160 cm) Weight: 97.2 kg (214 lb 4.6 oz) IBW/kg (Calculated) : 52.4 Heparin Dosing Weight: 72 kg  Vital Signs: Temp: 100.94 F (38.3 C) (06/22 0100) Temp Source: Core (06/22 0000) BP: 91/66 (06/22 0000) Pulse Rate: 98 (06/22 0045)  Labs: Recent Labs    06/09/21 0420 06/09/21 2841 06/06/2021 0322 05/27/2021 0323 06/01/2021 0515 05/27/2021 0921 05/22/2021 1422 06/06/2021 1649 06/02/2021 2020 06/11/21 0331 06/11/21 0542 06/11/21 1652 06/11/21 1850 06/11/21 2120 06/11/21 2126 06/06/2021 0037  HGB 8.3*   < > 7.5*  --    < > 8.8*   < > 10.1*   < > 11.0* 10.5*  --  9.5*  --  9.5*  --   HCT 25.1*   < > 23.2*  --    < > 26.0*   < > 30.0*   < > 32.3* 31.0*  --  28.0*  --  28.0*  --   PLT 71*   < > 44*  --   --  40*  --  45*  --  32*  --   --   --   --   --   --   APTT 102*  --  108*  --   --   --   --   --   --  50*  --   --   --   --   --   --   LABPROT 15.5*  --  15.7*  --   --   --   --   --   --  15.0  --   --   --   --   --   --   INR 1.2  --  1.3*  --   --   --   --   --   --  1.2  --   --   --   --   --   --   HEPARINUNFRC <0.10*   < >  --  0.13*  --   --   --   --   --  <0.10*  --   --   --   --   --  0.11*  CREATININE 1.49*   < > 1.69*  --   --   --    < > 1.64*  --  1.88*  --  2.25*  --  2.45*  --   --    < > = values in this interval not displayed.     Estimated Creatinine Clearance: 27.1 mL/min (A) (by C-G formula based on SCr of 2.45 mg/dL (H)).   Medical History: Past Medical History:  Diagnosis Date   Arthritis    Brain aneurysm    Diabetes mellitus without complication ([place])    Headache    Hypertension        SVT (supraventricular tachycardia) ([place])    s/p radiofrequency catheter ablation for AVNRT 02/16/09 (Dr. Cristopher Peru)   Thyroid disease    PROCEDURE FOR THYROID 15 YRS AGO AT [facility]   WPW (Wolff-Parkinson-White  syndrome)     Medications:  Infusions:   sodium chloride Stopped (05/30/2021 0918)   sodium chloride     sodium chloride     amiodarone 60 mg/hr (06/09/2021 0100)   ceFEPime (MAXIPIME) IV     dexmedetomidine (PRECEDEX) IV infusion 0.5 mcg/kg/hr (06/19/2021 0100)  epinephrine 5 mcg/min (06/03/2021 0100)   feeding supplement (PIVOT 1.5 CAL) 1,000 mL (06/11/21 1147)   furosemide (LASIX) 200 mg in dextrose 5% 100 mL (2mg /mL) infusion Stopped (06/11/21 2214)   heparin 25 units/mL (Impella PURGE) in dextrose 5 % 1000 mL bag     heparin 250 Units/hr (06/02/2021 0100)   HYDROmorphone 6 mg/hr (06/20/2021 0100)   lactated ringers 20 mL/hr at 06/08/2021 0100   lactated ringers     midazolam 5 mg/hr (05/27/2021 0100)   milrinone 0.375 mcg/kg/min (05/23/2021 0100)   norepinephrine (LEVOPHED) Adult infusion 40 mcg/min (06/16/2021 0100)   vancomycin Stopped (06/11/21 1950)   vasopressin 0.03 Units/min (06/03/2021 0100)    Assessment:  64 yoF admitted with cardiogenic shock 2/2 LM SCAD. Pt s/p CABG with VA-ECMO and Impella placement. ECMO decannulated on 6/20.   Pt has heparin half-strength purge infusing, pltc continues to trend down. HIT panel sent. Discussed with ECMO team - will not titrate heparin for now.  6/22 AM update:  Systemic heparin started earlier at request of CCM for afib Heparin level is sub-therapeutic this AM   Goal of Therapy:  Heparin level 0.3-0.5  Monitor platelets by anticoagulation protocol: Yes   Plan:  -Continue half-strength heparinized purge (25 units/ml) -Inc systemic heparin to 350 units/hr -0800 heparin level -F/U HIT panel  Narda Bonds, PharmD, BCPS Clinical Pharmacist Phone: [phone]

## 2021-06-12 NOTE — Progress Notes (Signed)
[place] for IV Heparin Indication:  Impella , Afib  No Known Allergies  Patient Measurements: Height: 5\' 3"  (160 cm) Weight: 96.9 kg (213 lb 10 oz) IBW/kg (Calculated) : 52.4 Heparin Dosing Weight: 72 kg  Vital Signs: Temp: 99.14 F (37.3 C) (06/22 1300) Temp Source: Core (06/22 0400) BP: 93/72 (06/22 1200) Pulse Rate: 82 (06/22 1200)  Labs: Recent Labs    06/09/2021 0322 05/27/2021 0323 05/30/2021 0515 06/02/2021 1649 05/31/2021 2020 06/11/21 0331 06/11/21 0542 06/11/21 1652 06/11/21 1850 06/11/21 2120 06/11/21 2126 06/06/2021 0037 06/19/2021 0330 06/15/2021 0459  HGB 7.5*  --    < > 10.1*   < > 11.0*   < >  --    < >  --  9.5*  --  9.2* 8.8*  HCT 23.2*  --    < > 30.0*   < > 32.3*   < >  --    < >  --  28.0*  --  28.1* 26.0*  PLT 44*  --    < > 45*  --  32*  --   --   --   --   --   --  45*  --   APTT 108*  --   --   --   --  50*  --   --   --   --   --   --  110*  --   LABPROT 15.7*  --   --   --   --  15.0  --   --   --   --   --   --  17.7*  --   INR 1.3*  --   --   --   --  1.2  --   --   --   --   --   --  1.5*  --   HEPARINUNFRC  --  0.13*  --   --   --  <0.10*  --   --   --   --   --  0.11*  --   --   CREATININE 1.69*  --    < > 1.64*  --  1.88*  --  2.25*  --  2.45*  --   --  2.70*  --    < > = values in this interval not displayed.     Estimated Creatinine Clearance: 24.6 mL/min (A) (by C-G formula based on SCr of 2.7 mg/dL (H)).   Medical History: Past Medical History:  Diagnosis Date   Arthritis    Brain aneurysm    Diabetes mellitus without complication ([facility])    Headache    Hypertension        SVT (supraventricular tachycardia) ([place])    s/p radiofrequency catheter ablation for AVNRT 02/16/09 (Dr. Cristopher Peru)   Thyroid disease    PROCEDURE FOR THYROID 15 YRS AGO AT [facility]   WPW (Wolff-Parkinson-White syndrome)     Medications:  Infusions:   sodium chloride Stopped (05/30/2021 0918)   sodium chloride     sodium  chloride     amiodarone 60 mg/hr (05/23/2021 1300)   ceFEPime (MAXIPIME) IV     dexmedetomidine (PRECEDEX) IV infusion 0.5 mcg/kg/hr (05/23/2021 1300)   epinephrine 5 mcg/min (05/30/2021 1300)   heparin 25 units/mL (Impella PURGE) in dextrose 5 % 1000 mL bag     HYDROmorphone 6 mg/hr (06/03/2021 1300)   lactated ringers 20 mL/hr at 06/07/2021 1300   lactated ringers  midazolam 5 mg/hr (05/22/2021 1300)   milrinone 0.375 mcg/kg/min (05/28/2021 1300)   norepinephrine (LEVOPHED) Adult infusion 30 mcg/min (06/08/2021 1300)   vancomycin Stopped (06/11/21 1950)   vasopressin 0.03 Units/min (05/29/2021 1305)    Assessment:  47 yoF admitted with cardiogenic shock 2/2 LM SCAD. Pt s/p CABG with VA-ECMO and Impella placement. ECMO decannulated on 6/20.   Pt has heparin half-strength purge (25 uts/hr)  infusing 54ml/hr (400 uts/hr ) HL 0.1 this am aptt 110 h/h stable 9/28  pltc low stable 45 . HIT panel sent.  Systemic heparin started 6/21 but plan for open chest washout 6/22 - holding systemic heparin drip for now   Goal of Therapy:  Heparin level 0.3-0.5  Monitor platelets by anticoagulation protocol: Yes   Plan:  -Continue half-strength heparinized purge (25 units/ml) -F/U HIT panel Daily heparin level CBC   Bonnita Nasuti Pharm.D. CPP, BCPS Clinical Pharmacist [phone] 06/19/2021 3:32 PM

## 2021-06-12 NOTE — Progress Notes (Signed)
Advanced Heart Failure Rounding Note   Subjective:    6/15 - LM coronary scad with emergent CABG  -> ECMO 6/17 - chest washout at bedside 6/19 - bronch due to RUL mucous plugging  6/20 - ECMO decannulation. Intra-op TEE EF ~35%   Decannulated from ECMO 6/20 Chest remains open .Remains on Impella at P-7 Flow 3.1. MAPs 70-75  On epi 3, NE 14->27, milrinone 0.375. In/out of AF all night. Creatinine up 2.7. Febrile o/n and started broad spectrum abx.   On lasix gtt. > 5L urine output Weight down 1 pound overnight. (Overall up 20 pounds)   CVP 9 PA 25/15 Thermo 3.3/1.7 Co-ox 55%  Objective:   Weight Range:  Vital Signs:   Temp:  [99.32 F (37.4 C)-101.66 F (38.7 C)] 99.32 F (37.4 C) (06/22 0738) Pulse Rate:  [69-151] 98 (06/22 0738) Resp:  [16-23] 22 (06/22 0738) BP: (77-103)/(55-73) 103/70 (06/22 0738) SpO2:  [83 %-100 %] 100 % (06/22 0738) Arterial Line BP: (67-103)/(49-71) 96/71 (06/22 0730) FiO2 (%):  [60 %-100 %] 90 % (06/22 0738) Weight:  [96.9 kg] 96.9 kg (06/22 0500) Last BM Date: 06/03/21  Weight change: Filed Weights   06/15/2021 0500 06/11/21 0500 05/28/2021 0500  Weight: 95.8 kg 97.2 kg 96.9 kg    Intake/Output:   Intake/Output Summary (Last 24 hours) at 05/23/2021 0743 Last data filed at 06/08/2021 0700 Gross per 24 hour  Intake 4287.12 ml  Output 5335 ml  Net -1047.88 ml      Physical Exam: General: Sedated on vent. Chest open  HEENT: normal +ETT Neck: supple. RIJ swan Carotids 2+ bilat; no bruits. No lymphadenopathy or thryomegaly appreciated. Cor: Chest open E[place] in place Lungs: clear Abdomen: soft, nontender, nondistended. No hepatosplenomegaly. No bruits or masses. Good bowel sounds. Extremities: no cyanosis, clubbing, rash, 2-3+ edema Neuro: alert & orientedx3, cranial nerves grossly intact. moves all 4 extremities w/o difficulty. Affect pleasant    Telemetry: NSR 80s Personally reviewed   Labs: Basic Metabolic Panel: Recent  Labs  Lab 06/06/2021 2055 06/06/21 0012 06/06/21 0402 06/06/21 2979 06/06/21 1546 06/06/21 1557 06/16/2021 1649 05/23/2021 2020 06/11/21 0331 06/11/21 0542 06/11/21 1652 06/11/21 1850 06/11/21 2120 06/11/21 2126 06/09/2021 0330 05/28/2021 0459  NA 144  142   < > 139   < > 141   < > 137   < > 136   < > 134* 135 135 135 133* 135  K 3.1*  2.9*   < > 3.4*   < > 3.7   < > 4.2   < > 4.5   < > 4.3 4.5 4.7 4.6 4.3 4.3  CL 112*  --  112*  --  113*   < > 108  --  108  --  102  --  103  --  104  --   CO2 17*  --  17*  --  20*   < > 22  --  21*  --  21*  --  21*  --  20*  --   GLUCOSE 207*  --  251*  --  178*   < > 124*  --  102*  --  123*  --  127*  --  141*  --   BUN 13  --  14  --  14   < > 13  --  16  --  18  --  19  --  20  --   CREATININE 1.76*  --  1.91*  --  1.90*   < > 1.64*  --  1.88*  --  2.25*  --  2.45*  --  2.70*  --   CALCIUM 7.4*  --  7.3*  --  7.4*   < > 8.2*  --  8.1*  --  7.9*  --  7.9*  --  7.8*  --   MG 1.8  --  2.6*  --  2.0  --   --   --  1.7  --   --   --   --   --  2.2  --    < > = values in this interval not displayed.     Liver Function Tests: Recent Labs  Lab 06/08/21 0302 06/09/21 0420 06/18/2021 0322 06/11/21 0331 05/25/2021 0330  AST 87* 56* 50* 74* 75*  ALT '16 9 7 8 6  ' ALKPHOS 28* 39 45 62 65  BILITOT 1.1 0.7 0.6 0.8 1.0  PROT 3.7* 4.0* 4.1* 4.6* 4.8*  ALBUMIN 2.4* 2.4* 2.1* 2.5* 2.4*    No results for input(s): LIPASE, AMYLASE in the last 168 hours. No results for input(s): AMMONIA in the last 168 hours.  CBC: Recent Labs  Lab 06/20/2021 0322 06/18/2021 0515 06/15/2021 0921 06/06/2021 1422 06/09/2021 1649 05/27/2021 2020 06/11/21 0331 06/11/21 0542 06/11/21 1850 06/11/21 2126 06/08/2021 0330 06/03/2021 0459  WBC 8.4  --  9.2  --  8.4  --  8.5  --   --   --  13.3*  --   HGB 7.5*   < > 8.8*   < > 10.1*   < > 11.0* 10.5* 9.5* 9.5* 9.2* 8.8*  HCT 23.2*   < > 26.0*   < > 30.0*   < > 32.3* 31.0* 28.0* 28.0* 28.1* 26.0*  MCV 93.2  --  89.7  --  87.0  --  87.8  --    --   --  89.5  --   PLT 44*  --  40*  --  45*  --  32*  --   --   --  45*  --    < > = values in this interval not displayed.     Cardiac Enzymes: No results for input(s): CKTOTAL, CKMB, CKMBINDEX, TROPONINI in the last 168 hours.  BNP: BNP (last 3 results) No results for input(s): BNP in the last 8760 hours.  ProBNP (last 3 results) No results for input(s): PROBNP in the last 8760 hours.    Other results:  Imaging: DG CHEST PORT 1 VIEW  Result Date: 06/09/2021 CLINICAL DATA:  Intubation.  CABG. EXAM: PORTABLE CHEST 1 VIEW COMPARISON:  06/11/2021 FINDINGS: Endotracheal tube, NG tube, Swan-Ganz catheter, mediastinal drainage catheter, left chest tube, shunt tubing in stable position. Impella device in stable position. Prior CABG. Cardiomegaly. Diffuse bilateral pulmonary infiltrates/edema again noted. Similar findings noted on prior exam. Persistent left base atelectasis. Small bilateral pleural effusions again noted. No pneumothorax. IMPRESSION: 1. Lines and tubes including Impella device and left chest tube in stable position. No pneumothorax. 2. Prior CABG. Cardiomegaly. Bilateral pulmonary infiltrates/edema small bilateral pleural effusions again noted. Findings most consistent with persistent CHF. 3.  Persistent left base atelectasis. Electronically Signed   By: Marcello Moores  Register   On: 05/27/2021 07:10   DG CHEST PORT 1 VIEW  Result Date: 06/11/2021 CLINICAL DATA:  Stated history of pneumonia. Technologist states open sternum tract support apparatus. EXAM: PORTABLE CHEST 1 VIEW COMPARISON:  Radiograph earlier today. FINDINGS: Endotracheal tube tip 4 cm from the carina. Right  internal jugular Swan-Ganz catheter tip in the region of the main pulmonary outflow tract. Intra-aortic balloon pump in place. Mediastinal drain and left chest tube in place. There is catheter tubing projecting over the right hemithorax, with tips not included in the field of view. Stable cardiomegaly. Unchanged  mediastinal contours. Bilateral perihilar opacities with slight improvement on the left from prior. There are bilateral pleural effusions. No pneumothorax. IMPRESSION: 1. Stable support apparatus from earlier today 2. Bilateral perihilar opacities with slight improvement on the left from prior, pulmonary edema versus infection. Stable cardiomegaly. Bilateral pleural effusions. Electronically Signed   By: Keith Rake M.D.   On: 06/11/2021 19:32   DG CHEST PORT 1 VIEW  Result Date: 06/11/2021 CLINICAL DATA:  Prior CABG.  Off of ECMO yesterday. EXAM: PORTABLE CHEST 1 VIEW COMPARISON:  05/23/2021. FINDINGS: Interval removal of ECMO device. Impella device noted with tip over left ventricle. Endotracheal tube, NG tube, right shunt tubing, mediastinal drainage catheters, left chest tube in stable position. Swan-Ganz catheter tip noted the pulmonary outflow tract. No pneumothorax. Stable cardiomegaly. New prominent bilateral pulmonary infiltrates and or edema noted on today's exam. Persist persistent bilateral subsegmental atelectasis. IMPRESSION: 1. Interval removal of ECMO device. Impella device noted with tip over left ventricle. Ganz catheter tip noted over the pulmonary outflow tract. Remaining lines and tubes including left chest tube in stable position. No pneumothorax. 2.  Stable cardiomegaly. 3. New prominent bilateral pulmonary infiltrates and or edema noted on today's exam. Persistent bilateral subsegmental atelectasis. Electronically Signed   By: Marcello Moores  Register   On: 06/11/2021 07:56   ECHO INTRAOPERATIVE TEE  Result Date: 05/27/2021  *INTRAOPERATIVE TRANSESOPHAGEAL REPORT *  Patient Name:   MIA MILAN Date of Exam: 06/06/2021 Medical Rec #:  [id number]       Height:       63.0 in Accession #:    [id number]      Weight:       211.2 lb Date of Birth:  1961-01-30        BSA:          1.98 m Patient Age:    60 years        BP:           91/82 mmHg Patient Gender: F               HR:           96  bpm. Exam Location:  Inpatient Transesophogeal exam was perform intraoperatively during surgical procedure. Patient was closely monitored under general anesthesia during the entirety of examination. Indications:     DECANNULATION FOR ECMO (EXTRACORPOREAL MEMBRANE OXYGENATION) Performing Phys: [place] Diagnosing Phys: Roberts Gaudy MD Complications: No known complications during this procedure. PRE-OP FINDINGS  Left Ventricle: There was an impella cannula crossing the aortic valve with the inflow port 3.3 cm proximal to the aortic valve. The outflow port was visualized within the proximal ascending aorta 1 cm distal to the sinotubular junction. Upon insertion of the TEE probe, the patient was on ECMO support at 2 l/m and the impella was at [facility] with 1.8 l/m flow. The LV was underfilled. With weaning the ECMO flow and increasing impella support, LV filling improved and systolic function appeared improved from the TEE study of 6/15. The inferior wall and inferior septum were contracting normally. The basal and mid anterior walls were hypokinetic. The distal anterior wall, anterior septum and apex were akinetic. The LV ejection fraction was calculated at  30% with 2.5 l/m Impella flow. Right Ventricle: The right ventricle has normal systolic function. The cavity was normal. There is no increase in right ventricular wall thickness. Left Atrium: No left atrial/left atrial appendage thrombus was detected. Right Atrium: Right atrial size was normal in size. Interatrial Septum: No atrial level shunt detected by color flow Doppler. There is no evidence of a patent foramen ovale. Pericardium: There was a large pericardial fluid collection seen in the anterior lateral pericardial space. This measured 2 cm in diameter. This fluid was evacuated intraoperatively and there remained a trace amount of pericardial fluid seen. Mitral Valve: Mitral valve regurgitation is mild by color flow Doppler. There is No evidence of  mitral stenosis. The mitral leaflets were mildly thickened. Tricuspid Valve: The tricuspid valve was normal in structure. Tricuspid valve regurgitation is trivial by color flow Doppler. No evidence of tricuspid stenosis is present. Aortic Valve: The aortic valve is tricuspid Aortic valve regurgitation is trivial by color flow Doppler. There was an impella cannula crossing the aortic valve. There was trace aortic insufficiency. The aortic valve leaflets were opening with each systole. Pulmonic Valve: The pulmonic valve was normal in structure, with normal. Pulmonic valve regurgitation is trivial by color flow Doppler. Aorta: There was no dissection seen in the ascending or descending aorta. The Impella cannula was identified within the ascending aorta. The descending aorrta was normal in diameter with the impella cannula was seen with the lumen. Shunts: There is no evidence of an atrial septal defect.  +------------------+---------++ LV Volumes (MOD)            +------------------+---------++ LV area d, A4C:   14.60 cm +------------------+---------++ LV area s, A4C:   13.70 cm +------------------+---------++ LV major d, A4C:  5.47 cm   +------------------+---------++ LV major s, A4C:  6.15 cm   +------------------+---------++ LV vol d, MOD A4C:31.4 ml   +------------------+---------++ LV vol s, MOD A4C:26.7 ml   +------------------+---------++ LV SV MOD A4C:    31.4 ml   +------------------+---------++  Roberts Gaudy MD Electronically signed by Roberts Gaudy MD Signature Date/Time: 06/03/2021/8:34:24 PM    Final      Medications:     Scheduled Medications:  aspirin  324 mg Per Tube Daily   chlorhexidine gluconate (MEDLINE KIT)  15 mL Mouth Rinse BID   Chlorhexidine Gluconate Cloth  6 each Topical Daily   docusate  200 mg Per Tube Daily   feeding supplement (PROSource TF)  90 mL Per Tube QID   insulin aspart  1-3 Units Subcutaneous Q4H   insulin detemir  5 Units Subcutaneous  Q12H   mouth rinse  15 mL Mouth Rinse 10 times per day   pantoprazole sodium  40 mg Per Tube Daily   rosuvastatin  40 mg Per Tube q1800   sennosides  10 mL Per Tube Daily   sodium chloride flush  10-40 mL Intracatheter Q12H   sodium chloride flush  3 mL Intravenous Q12H    Infusions:  sodium chloride Stopped (05/25/2021 0918)   sodium chloride     sodium chloride     amiodarone 60 mg/hr (06/16/2021 0700)   ceFEPime (MAXIPIME) IV     dexmedetomidine (PRECEDEX) IV infusion 0.5 mcg/kg/hr (05/23/2021 0700)   epinephrine 5 mcg/min (06/16/2021 0700)   feeding supplement (PIVOT 1.5 CAL) Stopped (06/11/2021 0340)   furosemide (LASIX) 200 mg in dextrose 5% 100 mL (29m/mL) infusion Stopped (06/11/21 2214)   heparin 25 units/mL (Impella PURGE) in dextrose 5 % 1000 mL bag  heparin 350 Units/hr (06/16/2021 0700)   HYDROmorphone 6 mg/hr (06/09/2021 0700)   lactated ringers 20 mL/hr at 06/01/2021 0700   lactated ringers     midazolam 5 mg/hr (06/19/2021 0700)   milrinone 0.375 mcg/kg/min (05/31/2021 0700)   norepinephrine (LEVOPHED) Adult infusion 27 mcg/min (05/31/2021 0700)   vancomycin Stopped (06/11/21 1950)   vasopressin 0.03 Units/min (06/11/2021 0700)    PRN Medications: sodium chloride, sodium chloride, sodium chloride, acetaminophen (TYLENOL) oral liquid 160 mg/5 mL, bisacodyl, HYDROmorphone, ipratropium-albuterol, metoprolol tartrate, midazolam, ondansetron (ZOFRAN) IV, polyethylene glycol, sodium chloride flush, sodium chloride flush   Assessment/Plan   Cardiogenic shock in setting of left main dissection and emergent CABG - chest open on V-A ECMO with Impella vent. S/p chest washout 6/17 - TEE 9/19 EF 35-40% - ECMO decannulated 6/20  - Impella at P-7. Flow 3.1 LDH 645 - On EPI 3 NE 14-> 27 milrinone 0.375 Output is marginal - Remains volume overloaded but difficult to maintain MAP with diuresis. Will need fluid off prior to chest closure. D/w Dr. Roxan Hockey. Will need chest washout this week.  Will discuss need to possibly upgrade to 5.5   2. CAD with acute due to left main dissection - s/p emergent CABG 06/11/2021 - No s/s angina - continue ASA/statin  3. Acute respiratory failure due to above - Attempting to diurese as tolerated - CCM managing vent  4 AKI - due to shock/ATN - SCr baseline 1.7 -> 2.7  - continue support. Keep MAPs > 70 - may need CVVHD soon   5. Hypomag/hypokalemia - supp as needed  6. Anemia due to expected post-operative blood loss and ECMO circuit - transfuse to keep hgb >= 8.0.  7. Thrombocytopenia - due to critical illness. PLTs 45k -> 32k - has h/o Von Willebrands deficiency - has received low-dose DDAVP - likely due to critical illness/Impella - HIT sent  8. Sepsis - developed high fevers on 6/21. Vanc/cefepime added - Cx pending  9. Atrial fibrillation, paroxysmal - continue amio  - would be very careful with heparin with open chest and low platelets  10. Post-operative ileus - consider TPN    CRITICAL CARE Performed by: Glori Bickers  Total critical care time: 45 minutes  Critical care time was exclusive of separately billable procedures and treating other patients.  Critical care was necessary to treat or prevent imminent or life-threatening deterioration.  Critical care was time spent personally by me (independent of midlevel providers or residents) on the following activities: development of treatment plan with patient and/or surrogate as well as nursing, discussions with consultants, evaluation of patient's response to treatment, examination of patient, obtaining history from patient or surrogate, ordering and performing treatments and interventions, ordering and review of laboratory studies, ordering and review of radiographic studies, pulse oximetry and re-evaluation of patient's condition.    Length of Stay: 7   Glori Bickers MD 06/16/2021, 7:43 AM  Advanced Heart Failure Team Pager [phone] (M-F; [place])   Please contact [facility] for night-coverage after hours (4p -7a ) and weekends on [email or web address]

## 2021-06-12 NOTE — Progress Notes (Addendum)
During mouth and oral care, bile noted to be coming out of patients mouth. Thoroughly cleaned patients mouth and hooked OGT to LIS. 1200 TF and bile colored drainage came out. Will turn off TF and keep OGT to LIS until dayshift.

## 2021-06-12 NOTE — CV Procedure (Signed)
    DIRECT CURRENT CARDIOVERSION  NAME:  [name] [name]   MRN: [id number] DOB:  1961-01-12   ADMIT DATE: 05/28/2021   INDICATIONS: Atrial fibrillation    PROCEDURE:   Informed consent was obtained prior to the procedure. The risks, benefits and alternatives for the procedure were discussed and the patient comprehended these risks. Once an appropriate time out was taken, the patient had the defibrillator pads placed in the anterior and posterior position. The patient then underwent sedation by the anesthesia service. Once an appropriate level of sedation was achieved, the patient received a single biphasic, synchronized 200J shock with apparent conversion to sinus rhythm. (Hard to assess with low voltage) No apparent complications. ECG pending   Glori Bickers, MD  5:12 PM

## 2021-06-13 ENCOUNTER — Encounter (HOSPITAL_COMMUNITY): Payer: Self-pay | Admitting: Thoracic Surgery (Cardiothoracic Vascular Surgery)

## 2021-06-13 ENCOUNTER — Inpatient Hospital Stay (HOSPITAL_COMMUNITY): Payer: 59

## 2021-06-13 ENCOUNTER — Encounter (HOSPITAL_COMMUNITY)
Admission: EM | Disposition: E | Payer: Self-pay | Source: Home / Self Care | Attending: Thoracic Surgery (Cardiothoracic Vascular Surgery)

## 2021-06-13 ENCOUNTER — Other Ambulatory Visit (HOSPITAL_COMMUNITY): Payer: 59

## 2021-06-13 DIAGNOSIS — N179 Acute kidney failure, unspecified: Secondary | ICD-10-CM | POA: Diagnosis not present

## 2021-06-13 DIAGNOSIS — I9789 Other postprocedural complications and disorders of the circulatory system, not elsewhere classified: Secondary | ICD-10-CM

## 2021-06-13 DIAGNOSIS — R570 Cardiogenic shock: Secondary | ICD-10-CM | POA: Diagnosis not present

## 2021-06-13 DIAGNOSIS — I214 Non-ST elevation (NSTEMI) myocardial infarction: Secondary | ICD-10-CM | POA: Diagnosis not present

## 2021-06-13 LAB — COOXEMETRY PANEL
Carboxyhemoglobin: 0.7 % (ref 0.5–1.5)
Carboxyhemoglobin: 0.6 % (ref 0.5–1.5)
Methemoglobin: 0.9 % (ref 0.0–1.5)
Methemoglobin: 1.1 % (ref 0.0–1.5)
O2 Saturation: 42.7 %
O2 Saturation: 41.7 %
Total hemoglobin: 12.7 g/dL (ref 12.0–16.0)
Total hemoglobin: 9.7 g/dL — ABNORMAL LOW (ref 12.0–16.0)

## 2021-06-13 LAB — HEPATIC FUNCTION PANEL
ALT: 6 U/L (ref 0–44)
AST: 59 U/L — ABNORMAL HIGH (ref 15–41)
Albumin: 2 g/dL — ABNORMAL LOW (ref 3.5–5.0)
Alkaline Phosphatase: 67 U/L (ref 38–126)
Bilirubin, Direct: 0.7 mg/dL — ABNORMAL HIGH (ref 0.0–0.2)
Indirect Bilirubin: 0.6 mg/dL (ref 0.3–0.9)
Total Bilirubin: 1.3 mg/dL — ABNORMAL HIGH (ref 0.3–1.2)
Total Protein: 4.5 g/dL — ABNORMAL LOW (ref 6.5–8.1)

## 2021-06-13 LAB — POCT I-STAT 7, (LYTES, BLD GAS, ICA,H+H)
Acid-base deficit: 3 mmol/L — ABNORMAL HIGH (ref 0.0–2.0)
Bicarbonate: 21.5 mmol/L (ref 20.0–28.0)
Calcium, Ion: 1.17 mmol/L (ref 1.15–1.40)
HCT: 21 % — ABNORMAL LOW (ref 36.0–46.0)
Hemoglobin: 7.1 g/dL — ABNORMAL LOW (ref 12.0–15.0)
O2 Saturation: 88 %
Patient temperature: 36.6
Potassium: 4.1 mmol/L (ref 3.5–5.1)
Sodium: 135 mmol/L (ref 135–145)
TCO2: 23 mmol/L (ref 22–32)
pCO2 arterial: 36.2 mmHg (ref 32.0–48.0)
pH, Arterial: 7.38 (ref 7.350–7.450)
pO2, Arterial: 54 mmHg — ABNORMAL LOW (ref 83.0–108.0)

## 2021-06-13 LAB — GLUCOSE, CAPILLARY
Glucose-Capillary: 128 mg/dL — ABNORMAL HIGH (ref 70–99)
Glucose-Capillary: 132 mg/dL — ABNORMAL HIGH (ref 70–99)
Glucose-Capillary: 133 mg/dL — ABNORMAL HIGH (ref 70–99)
Glucose-Capillary: 140 mg/dL — ABNORMAL HIGH (ref 70–99)
Glucose-Capillary: 146 mg/dL — ABNORMAL HIGH (ref 70–99)
Glucose-Capillary: 151 mg/dL — ABNORMAL HIGH (ref 70–99)

## 2021-06-13 LAB — BASIC METABOLIC PANEL
Anion gap: 7 (ref 5–15)
Anion gap: 9 (ref 5–15)
BUN: 23 mg/dL — ABNORMAL HIGH (ref 6–20)
BUN: 28 mg/dL — ABNORMAL HIGH (ref 6–20)
CO2: 21 mmol/L — ABNORMAL LOW (ref 22–32)
CO2: 20 mmol/L — ABNORMAL LOW (ref 22–32)
Calcium: 7.7 mg/dL — ABNORMAL LOW (ref 8.9–10.3)
Calcium: 7.4 mg/dL — ABNORMAL LOW (ref 8.9–10.3)
Chloride: 105 mmol/L (ref 98–111)
Chloride: 102 mmol/L (ref 98–111)
Creatinine, Ser: 2.81 mg/dL — ABNORMAL HIGH (ref 0.44–1.00)
Creatinine, Ser: 3.02 mg/dL — ABNORMAL HIGH (ref 0.44–1.00)
GFR, Estimated: 19 mL/min — ABNORMAL LOW (ref >60)
GFR, Estimated: 17 mL/min — ABNORMAL LOW (ref >60)
Glucose, Bld: 140 mg/dL — ABNORMAL HIGH (ref 70–99)
Glucose, Bld: 132 mg/dL — ABNORMAL HIGH (ref 70–99)
Potassium: 3.9 mmol/L (ref 3.5–5.1)
Potassium: 4.2 mmol/L (ref 3.5–5.1)
Sodium: 133 mmol/L — ABNORMAL LOW (ref 135–145)
Sodium: 131 mmol/L — ABNORMAL LOW (ref 135–145)

## 2021-06-13 LAB — SEROTONIN RELEASE ASSAY (SRA)
SRA .2 IU/mL UFH Ser-aCnc: 2 % (ref 0–20)
SRA 100IU/mL UFH Ser-aCnc: 2 % (ref 0–20)

## 2021-06-13 LAB — AEROBIC CULTURE W GRAM STAIN (SUPERFICIAL SPECIMEN): Culture: NO GROWTH

## 2021-06-13 LAB — CBC
HCT: 23 % — ABNORMAL LOW (ref 36.0–46.0)
HCT: 23.3 % — ABNORMAL LOW (ref 36.0–46.0)
Hemoglobin: 7.7 g/dL — ABNORMAL LOW (ref 12.0–15.0)
Hemoglobin: 7.7 g/dL — ABNORMAL LOW (ref 12.0–15.0)
MCH: 30.1 pg (ref 26.0–34.0)
MCH: 29.6 pg (ref 26.0–34.0)
MCHC: 33.5 g/dL (ref 30.0–36.0)
MCHC: 33 g/dL (ref 30.0–36.0)
MCV: 89.8 fL (ref 80.0–100.0)
MCV: 89.6 fL (ref 80.0–100.0)
Platelets: 49 10*3/uL — ABNORMAL LOW (ref 150–400)
Platelets: 50 10*3/uL — ABNORMAL LOW (ref 150–400)
RBC: 2.56 MIL/uL — ABNORMAL LOW (ref 3.87–5.11)
RBC: 2.6 MIL/uL — ABNORMAL LOW (ref 3.87–5.11)
RDW: 16.4 % — ABNORMAL HIGH (ref 11.5–15.5)
RDW: 16.9 % — ABNORMAL HIGH (ref 11.5–15.5)
WBC: 17.2 10*3/uL — ABNORMAL HIGH (ref 4.0–10.5)
WBC: 19.5 10*3/uL — ABNORMAL HIGH (ref 4.0–10.5)
nRBC: 1.6 % — ABNORMAL HIGH (ref 0.0–0.2)
nRBC: 2.4 % — ABNORMAL HIGH (ref 0.0–0.2)

## 2021-06-13 LAB — PREPARE RBC (CROSSMATCH)

## 2021-06-13 LAB — PROTIME-INR
INR: 1.6 — ABNORMAL HIGH (ref 0.8–1.2)
Prothrombin Time: 18.9 seconds — ABNORMAL HIGH (ref 11.4–15.2)

## 2021-06-13 LAB — APTT: aPTT: 64 seconds — ABNORMAL HIGH (ref 24–36)

## 2021-06-13 LAB — LACTATE DEHYDROGENASE: LDH: 665 U/L — ABNORMAL HIGH (ref 98–192)

## 2021-06-13 LAB — FIBRINOGEN: Fibrinogen: 698 mg/dL — ABNORMAL HIGH (ref 210–475)

## 2021-06-13 LAB — HEPARIN LEVEL (UNFRACTIONATED): Heparin Unfractionated: <0.1 IU/mL — ABNORMAL LOW (ref 0.30–0.70)

## 2021-06-13 SURGERY — CLOSURE, STERNUM
Anesthesia: General

## 2021-06-13 MED ORDER — FUROSEMIDE 10 MG/ML IJ SOLN
60.0000 mg | Freq: Once | INTRAMUSCULAR | Status: AC
  Administered 2021-06-13: 60 mg via INTRAVENOUS
  Filled 2021-06-13: qty 6

## 2021-06-13 MED ORDER — STERILE WATER FOR INJECTION IJ SOLN
INTRAMUSCULAR | Status: AC
  Filled 2021-06-13: qty 10

## 2021-06-13 MED ORDER — SODIUM CHLORIDE 0.9% IV SOLUTION: Freq: Once | INTRAVENOUS | Status: AC

## 2021-06-13 MED ORDER — HYDROCORTISONE NA SUCCINATE PF 100 MG IJ SOLR
50.0000 mg | Freq: Four times a day (QID) | INTRAMUSCULAR | Status: DC
  Administered 2021-06-13 – 2021-06-16 (×10): 50 mg via INTRAVENOUS
  Filled 2021-06-13 (×10): qty 2

## 2021-06-13 MED ORDER — VECURONIUM BROMIDE 10 MG IV SOLR
INTRAVENOUS | Status: AC
  Filled 2021-06-13: qty 10

## 2021-06-13 MED ORDER — ALBUMIN HUMAN 25 % IV SOLN
12.5000 g | Freq: Once | INTRAVENOUS | Status: AC
  Administered 2021-06-13: 12.5 g via INTRAVENOUS
  Filled 2021-06-13: qty 50

## 2021-06-13 MED ORDER — FUROSEMIDE 10 MG/ML IJ SOLN
20.0000 mg/h | INTRAVENOUS | Status: DC
  Administered 2021-06-13 – 2021-06-14 (×2): 8 mg/h via INTRAVENOUS
  Administered 2021-06-15 (×2): 12 mg/h via INTRAVENOUS
  Administered 2021-06-15: 20 mg/h via INTRAVENOUS
  Filled 2021-06-13 (×9): qty 20

## 2021-06-13 MED ORDER — MAGNESIUM SULFATE 2 GM/50ML IV SOLN
INTRAVENOUS | Status: AC
  Administered 2021-06-13: 2 g via INTRAVENOUS
  Filled 2021-06-13: qty 50

## 2021-06-13 MED ORDER — VECURONIUM BROMIDE 10 MG IV SOLR
5.0000 mg | Freq: Once | INTRAVENOUS | Status: AC
  Administered 2021-06-13: 5 mg via INTRAVENOUS

## 2021-06-13 MED ORDER — MILRINONE LACTATE IN DEXTROSE 20-5 MG/100ML-% IV SOLN
0.1250 ug/kg/min | INTRAVENOUS | Status: DC
  Administered 2021-06-13: 0.375 ug/kg/min via INTRAVENOUS
  Administered 2021-06-13: 0.75 ug/kg/min via INTRAVENOUS
  Administered 2021-06-14: 0.25 ug/kg/min via INTRAVENOUS
  Filled 2021-06-13 (×3): qty 100

## 2021-06-13 MED ORDER — VITAL HIGH PROTEIN PO LIQD: 1000.0000 mL | ORAL | Status: DC

## 2021-06-13 MED ORDER — PIVOT 1.5 CAL PO LIQD
1000.0000 mL | ORAL | Status: DC
  Administered 2021-06-13: 1000 mL

## 2021-06-13 MED ORDER — MAGNESIUM SULFATE 2 GM/50ML IV SOLN: 2.0000 g | Freq: Once | INTRAVENOUS | Status: AC

## 2021-06-13 MED ORDER — AMIODARONE LOAD VIA INFUSION
150.0000 mg | Freq: Once | INTRAVENOUS | Status: AC
  Administered 2021-06-13: 150 mg via INTRAVENOUS
  Filled 2021-06-13: qty 83.34

## 2021-06-13 MED FILL — Sodium Chloride IV Soln 0.9%: INTRAVENOUS | Qty: 2000 | Status: AC

## 2021-06-13 NOTE — Progress Notes (Signed)
65mL versed wasted and witnessed with Erma Heritage, RN d/t expiring IV tubing.

## 2021-06-13 NOTE — Progress Notes (Signed)
Nutrition Follow-up  DOCUMENTATION CODES:   Not applicable  INTERVENTION:   If pt does not tolerate trickle TF and/or unable to advance TF to goal rate, recommend initiation of TPN.   Tube Feeding via Cortrak:  Initiate Pivot 1.5 a 20 ml/hr today, goal rate of  60 ml/hr Provides 2160 kcals, 135 g of protein and 1094 mL of free water  With trickle TF, continue Pro-source TF 90 mL QID to meet protein needs (trickle TF plus Pro-Source provides 133 g of protein, does not meet calorie needs)   NUTRITION DIAGNOSIS:   Inadequate oral intake related to acute illness as evidenced by NPO status.  Being addressed  GOAL:   Patient will meet greater than or equal to 90% of their needs  progressing  MONITOR:   Vent status, TF tolerance, Labs, Weight trends  REASON FOR ASSESSMENT:   Consult, Ventilator Enteral/tube feeding initiation and management (ECMO)  ASSESSMENT:   60 yo female admitted with chest pain with STEMI requiring emergent CABG, cardiogenic shock in setting of left main dissection requiring VA ECMO, intubation PMH includes DM, HTN, HLD, CVA  6/14 Admitted 6/15 Cannulated for VA ECMO, Intubated 6/16 TF initiated at 20 ml/hr 6/19 Trickle TF held, OG-LIS 6/20 OR-VA decannulation 6/21 Trickle TF restarted, did not tolerate, bile coming from mouth overnight-OG-LIS with 1200 mL out 6/22 Bedside chest washout with evacuation of mediastinal clot for cardiac tamponade, Cortrak placed gastric-unable to advance to postpyloric position 6/23 +BM finally  Pt remains on vent support, chest remains open. Bronch at bedside today, mucous plug removed  Noted pt may require CRRT  Inadequate nutrition (NPO or trickle TF only) x 9 days +BM finally; Rectal tube in place. Trickle feedings to resume today  Abd xray with Cortrak tip either in the distal stomach/proximal duodenum  Labs: sodium 133 (L) Meds: lasix, ss novolog, reglan, colace, lasix gtt, ss novolog, mirelax   Diet  Order:   Diet Order             Diet NPO time specified  Diet effective now                   EDUCATION NEEDS:   Not appropriate for education at this time  Skin:  Skin Assessment: Skin Integrity Issues: Skin Integrity Issues:: Incisions Incisions: open sternum  Last BM:  6/23  Height:   Ht Readings from Last 1 Encounters:  06/01/2021 5\' 3"  (1.6 m)    Weight:   Wt Readings from Last 1 Encounters:  06/19/2021 99.9 kg     BMI:  Body mass index is 39.01 kg/m.  Estimated Nutritional Needs:   Kcal:  2100-2300 kcals  Protein:  125-140 g  Fluid:  >/= 1.9 L   Kerman Passey MS, RDN, LDN, CNSC Registered Dietitian III Clinical Nutrition RD Pager and On-Call Pager Number Located in [place]

## 2021-06-13 NOTE — Progress Notes (Signed)
Patient had small amount of emesis that appeared to be stool.  Notified Dr. Lynetta Mare who came to bedside.  OGT placed and put to suction.  11104mL of Dark brown contents removed from stomach. Tube feeding stopped at this time.  Cortrak flushed and clamped off.

## 2021-06-13 NOTE — Progress Notes (Signed)
[facility] for IV Heparin Indication:  Impella , Afib  No Known Allergies  Patient Measurements: Height: 5\' 3"  (160 cm) Weight: 99.9 kg (220 lb 3.8 oz) IBW/kg (Calculated) : 52.4 Heparin Dosing Weight: 72 kg  Vital Signs: Temp: 97.34 F (36.3 C) (06/23 1509) Temp Source: Core (06/23 1200) BP: 99/58 (06/23 1509) Pulse Rate: 118 (06/23 1509)  Labs: Recent Labs    06/11/21 0331 06/11/21 0542 06/15/2021 0037 06/09/2021 0330 06/18/2021 0459 06/20/2021 1550 06/03/2021 2150 05/29/2021 0357 05/28/2021 0359 05/27/2021 0954  HGB 11.0*   < >  --  9.2*   < >  --  8.2* 7.1* 7.7*  --   HCT 32.3*   < >  --  28.1*   < >  --  24.6* 21.0* 23.0*  --   PLT 32*  --   --  45*  --   --  47*  --  49*  --   APTT 50*  --   --  110*  --   --   --   --  64*  --   LABPROT 15.0  --   --  17.7*  --   --   --   --  18.9*  --   INR 1.2  --   --  1.5*  --   --   --   --  1.6*  --   HEPARINUNFRC <0.10*  --  0.11*  --   --   --   --   --   --  <0.10*  CREATININE 1.88*   < >  --  2.70*  --  2.72* 2.82*  --  2.81*  --    < > = values in this interval not displayed.     Estimated Creatinine Clearance: 24 mL/min (A) (by C-G formula based on SCr of 2.81 mg/dL (H)).   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 Cristopher Peru)   Thyroid disease    PROCEDURE FOR THYROID 15 YRS AGO AT [facility]   WPW (Wolff-Parkinson-White syndrome)     Medications:  Infusions:   sodium chloride Stopped (05/28/2021 0918)   sodium chloride     sodium chloride     amiodarone 60 mg/hr (05/28/2021 1500)   ceFEPime (MAXIPIME) IV Stopped (06/06/2021 1803)   dexmedetomidine (PRECEDEX) IV infusion Stopped (06/02/2021 1218)   epinephrine 5 mcg/min (06/15/2021 1500)   feeding supplement  (PIVOT 1.5 CAL) 1,000 mL (06/06/2021 1241)   furosemide (LASIX) 200 mg in dextrose 5% 100 mL (2mg /mL) infusion 8 mg/hr (06/03/2021 1500)   heparin 25 units/mL (Impella PURGE) in dextrose 5 % 1000 mL bag     HYDROmorphone 4 mg/hr (06/02/2021 1500)   lactated ringers Stopped (05/27/2021 1900)   lactated ringers 10 mL/hr at 05/22/2021 0007   midazolam 5 mg/hr (05/24/2021 1500)   milrinone 0.25 mcg/kg/min (06/03/2021 1500)   norepinephrine (LEVOPHED) Adult infusion 46 mcg/min (05/27/2021 1400)   vancomycin Stopped (06/11/21 1950)   vasopressin 0.03 Units/min (06/01/2021 1500)    Assessment:  81 yoF admitted with cardiogenic shock 2/2 LM SCAD. Pt s/p CABG with VA-ECMO and Impella CP placement. ECMO decannulated on 6/20. Continues in A-flutter  Pt has heparin half-strength purge (  25 uts/hr)  infusing 52ml/hr (400 uts/hr ) HL 0.1 this am aptt 64sec h/h low this am 7.7/23  pltc low stable 49. S/p open chest and washout 6/23 still CT output  HIT negative 6/21 Systemic heparin on hold for now   Goal of Therapy:  Heparin level 0.3-0.5  Monitor platelets by anticoagulation protocol: Yes   Plan:  -Continue half-strength heparinized purge (25 units/ml) Daily heparin level CBC   Bonnita Nasuti Pharm.D. CPP, BCPS Clinical Pharmacist [phone] 06/15/2021 3:26 PM

## 2021-06-13 NOTE — Progress Notes (Signed)
Dr. Lynetta Mare notified of repeat Coox of 41.7.  No orders received at this time.

## 2021-06-13 NOTE — Progress Notes (Signed)
Dr. Earlie Server at bedside to assess patient. Ordered to increase milrinone to 0.5 based on down trending CO/CI. Will repeat CO/CI in one hour and notify if not improving.

## 2021-06-13 NOTE — Progress Notes (Signed)
TCTS Evening Rounds  POD 6 s/p CABG Remains critically ill  Supported with Impella Cp and multiple drips (Levo 46; vaso 0.04) BP (!) 87/65   Pulse (!) 111   Temp 98.06 F (36.7 C)   Resp (!) 22   Ht 5\' 3"  (1.6 m)   Wt 99.9 kg   SpO2 91%   BMI 39.01 kg/m  Sedated Open chest Irreg/irreg   Intake/Output Summary (Last 24 hours) at 06/15/2021 1823 Last data filed at 06/16/2021 1800 Gross per 24 hour  Intake 3868.93 ml  Output 3045 ml  Net 823.93 ml   sCr 3 Hct 23 this am--transfused with post txfusion H/H pending  A/p: Continue present supportive care Lenya Sterne Z. Orvan Seen, [place]

## 2021-06-13 NOTE — Progress Notes (Signed)
Advanced Heart Failure Rounding Note   Subjective:    6/15 - LM coronary scad with emergent CABG  -> ECMO 6/17 - chest washout at bedside 6/19 - bronch due to RUL mucous plugging  6/20 - ECMO decannulation. Intra-op TEE EF ~35% 6/21 - Repeat washout 6/21 - DCCV   Decannulated from ECMO 6/20 Chest remains open. Sedated on vent.   Rough night. BPs low. Multiple changes in pressors.   Remains on Impella at P-6 Flow 2.6  MAPs 65-70  Now on epi 3 -> 5 NE 14->27 -> 40, milrinone 0.375. VP 0.03   Weight up another 7 pounds. Remains in NSR aft DCCV. On amio 60   CVP 15 Thermo 4.2/2.2 Co-ox 43%  Objective:   Weight Range:  Vital Signs:   Temp:  [97.34 F (36.3 C)-99.32 F (37.4 C)] 97.52 F (36.4 C) (06/23 0753) Pulse Rate:  [65-103] 102 (06/23 0753) Resp:  [0-27] 24 (06/23 0753) BP: (92-106)/(63-87) 98/72 (06/23 0753) SpO2:  [71 %-100 %] 90 % (06/23 0754) Arterial Line BP: (71-115)/(43-80) 83/50 (06/23 0745) FiO2 (%):  [50 %-100 %] 100 % (06/23 0754) Weight:  [99.9 kg] 99.9 kg (06/23 0500) Last BM Date: 06/03/21  Weight change: Filed Weights   06/11/21 0500 06/02/2021 0500 05/31/2021 0500  Weight: 97.2 kg 96.9 kg 99.9 kg    Intake/Output:   Intake/Output Summary (Last 24 hours) at 05/30/2021 0917 Last data filed at 06/20/2021 0700 Gross per 24 hour  Intake 3747.8 ml  Output 2145 ml  Net 1602.8 ml      Physical Exam: General: Sedated on vent. Chest open  HEENT: normal + ETT Neck: supple. +swan  Cor: Chest open. Tachycardic + CTs. Lungs: coarse Abdomen: obese soft, nontender, nondistended. No hepatosplenomegaly. No bruits or masses. Hypoactive bowel sounds. Extremities: no cyanosis, clubbing, rash, 3+ edema Neuro: intubated/sedated    Telemetry: NSR 100-110 Personally reviewed   Labs: Basic Metabolic Panel: Recent Labs  Lab 06/06/21 1546 06/06/21 1557 06/11/21 0331 06/11/21 0542 06/11/21 2120 06/11/21 2126 05/24/2021 0330 06/15/2021 0459  06/01/2021 1550 06/16/2021 2150 06/11/2021 0357 06/06/2021 0359  NA 141   < > 136   < > 135   < > 133* 135 133* 134* 135 133*  K 3.7   < > 4.5   < > 4.7   < > 4.3 4.3 3.8 4.0 4.1 3.9  CL 113*   < > 108   < > 103  --  104  --  103 102  --  105  CO2 20*   < > 21*   < > 21*  --  20*  --  20* 22  --  21*  GLUCOSE 178*   < > 102*   < > 127*  --  141*  --  135* 146*  --  140*  BUN 14   < > 16   < > 19  --  20  --  21* 22*  --  23*  CREATININE 1.90*   < > 1.88*   < > 2.45*  --  2.70*  --  2.72* 2.82*  --  2.81*  CALCIUM 7.4*   < > 8.1*   < > 7.9*  --  7.8*  --  7.6* 7.9*  --  7.7*  MG 2.0  --  1.7  --   --   --  2.2  --   --   --   --   --    < > =  values in this interval not displayed.     Liver Function Tests: Recent Labs  Lab 06/09/21 0420 06/16/2021 0322 06/11/21 0331 06/03/2021 0330 05/28/2021 0359  AST 56* 50* 74* 75* 59*  ALT _0 ALKPHOS 39 45 62 65 67  BILITOT 0.7 0.6 0.8 1.0 1.3*  PROT 4.0* 4.1* 4.6* 4.8* 4.5*  ALBUMIN 2.4* 2.1* 2.5* 2.4* 2.0*    No results for input(s): LIPASE, AMYLASE in the last 168 hours. No results for input(s): AMMONIA in the last 168 hours.  CBC: Recent Labs  Lab 05/30/2021 1649 06/20/2021 2020 06/11/21 0331 06/11/21 0542 06/15/2021 0330 05/31/2021 0459 06/03/2021 2150 06/08/2021 0357 06/15/2021 0359  WBC 8.4  --  8.5  --  13.3*  --  14.7*  --  17.2*  HGB 10.1*   < > 11.0*   < > 9.2* 8.8* 8.2* 7.1* 7.7*  HCT 30.0*   < > 32.3*   < > 28.1* 26.0* 24.6* 21.0* 23.0*  MCV 87.0  --  87.8  --  89.5  --  90.1  --  89.8  PLT 45*  --  32*  --  45*  --  47*  --  49*   < > = values in this interval not displayed.     Cardiac Enzymes: No results for input(s): CKTOTAL, CKMB, CKMBINDEX, TROPONINI in the last 168 hours.  BNP: BNP (last 3 results) No results for input(s): BNP in the last 8760 hours.  ProBNP (last 3 results) No results for input(s): PROBNP in the last 8760 hours.    Other results:  Imaging: DG CHEST PORT 1 VIEW  Result Date:  06/20/2021 CLINICAL DATA:  Intubation.  Chest tube. EXAM: PORTABLE CHEST 1 VIEW COMPARISON:  05/28/2021. FINDINGS: Endotracheal tube, feeding tube, Swan-Ganz catheter, mediastinal drainage catheters, left chest tube, shunt tubing in stable position. Prior CABG. Cardiomegaly with bilateral interstitial prominence consistent interstitial edema. Interim slight improvement interstitial edema from prior exam. Bibasilar atelectasis again noted. Small bilateral pleural effusions again noted. No pneumothorax. IMPRESSION: 1. Lines and tubes including in polyp device and left chest tube in stable position. No pneumothorax. 2. Prior CABG. Cardiomegaly with bilateral interstitial prominence suggesting interstitial edema. Interim slight improvement of interstitial edema from prior exam. 3. Bibasilar atelectasis again noted. Small bilateral pleural effusions again noted. Electronically Signed   By: Marcello Moores  Register   On: 05/30/2021 07:38   DG CHEST PORT 1 VIEW  Result Date: 06/02/2021 CLINICAL DATA:  Intubation.  CABG. EXAM: PORTABLE CHEST 1 VIEW COMPARISON:  06/11/2021 FINDINGS: Endotracheal tube, NG tube, Swan-Ganz catheter, mediastinal drainage catheter, left chest tube, shunt tubing in stable position. Impella device in stable position. Prior CABG. Cardiomegaly. Diffuse bilateral pulmonary infiltrates/edema again noted. Similar findings noted on prior exam. Persistent left base atelectasis. Small bilateral pleural effusions again noted. No pneumothorax. IMPRESSION: 1. Lines and tubes including Impella device and left chest tube in stable position. No pneumothorax. 2. Prior CABG. Cardiomegaly. Bilateral pulmonary infiltrates/edema small bilateral pleural effusions again noted. Findings most consistent with persistent CHF. 3.  Persistent left base atelectasis. Electronically Signed   By: Marcello Moores  Register   On: 06/18/2021 07:10   DG CHEST PORT 1 VIEW  Result Date: 06/11/2021 CLINICAL DATA:  Stated history of pneumonia.  Technologist states open sternum tract support apparatus. EXAM: PORTABLE CHEST 1 VIEW COMPARISON:  Radiograph earlier today. FINDINGS: Endotracheal tube tip 4 cm from the carina. Right internal jugular Swan-Ganz catheter tip in the region of the main pulmonary outflow  tract. Intra-aortic balloon pump in place. Mediastinal drain and left chest tube in place. There is catheter tubing projecting over the right hemithorax, with tips not included in the field of view. Stable cardiomegaly. Unchanged mediastinal contours. Bilateral perihilar opacities with slight improvement on the left from prior. There are bilateral pleural effusions. No pneumothorax. IMPRESSION: 1. Stable support apparatus from earlier today 2. Bilateral perihilar opacities with slight improvement on the left from prior, pulmonary edema versus infection. Stable cardiomegaly. Bilateral pleural effusions. Electronically Signed   By: Keith Rake M.D.   On: 06/11/2021 19:32   DG Abd Portable 1V  Result Date: 05/27/2021 CLINICAL DATA:  Question feeding tube position. EXAM: PORTABLE ABDOMEN - 1 VIEW COMPARISON:  Abdomen 06/20/2021. FINDINGS: Feeding tube noted in stable position with tip over the distal stomach/proximal duodenum. Right shunt tubing noted. No bowel distention. Mild lumbar spine scoliosis and degenerative change. Mild degenerative changes both hips. IMPRESSION: Feeding tube noted with tip over the distal stomach/proximal duodenum. No bowel distention. Electronically Signed   By: Marcello Moores  Register   On: 05/25/2021 07:24   DG Abd Portable 1V  Result Date: 06/16/2021 CLINICAL DATA:  Feeding tube placement EXAM: PORTABLE ABDOMEN - 1 VIEW COMPARISON:  Prior study same day. FINDINGS: Feeding tube noted with tip over the distal stomach. VP shunt tubing noted. Tubing noted over the abdomen. Leads noted over the abdomen. Paucity of bowel gas again noted. IMPRESSION: Feeding tube noted with tip over the stomach. Electronically Signed   By:  Marcello Moores  Register   On: 05/23/2021 12:29   DG Abd Portable 1V  Result Date: 05/27/2021 CLINICAL DATA:  Abdominal distension, rule out obstruction EXAM: PORTABLE ABDOMEN - 1 VIEW COMPARISON:  None. FINDINGS: General paucity of bowel gas, without evidence of obstruction or obvious free air. Esophagogastric tube with tip and side port below the diaphragm. Additional overlying support apparatus includes multiple chest tubes. Shunt catheter, with tip position in the right hemiabdomen. IMPRESSION: 1. General paucity of bowel gas, without evidence of obstruction or obvious free air. 2. Esophagogastric tube with tip and side port below the diaphragm. Additional overlying support apparatus includes multiple chest tubes. Electronically Signed   By: Eddie Candle M.D.   On: 05/26/2021 10:10     Medications:     Scheduled Medications:  aspirin  324 mg Per Tube Daily   chlorhexidine gluconate (MEDLINE KIT)  15 mL Mouth Rinse BID   Chlorhexidine Gluconate Cloth  6 each Topical Daily   colchicine  0.3 mg Per Tube Daily   docusate  200 mg Per Tube Daily   feeding supplement (PROSource TF)  90 mL Per Tube QID   feeding supplement (VITAL HIGH PROTEIN)  1,000 mL Per Tube Q24H   furosemide  60 mg Intravenous Once   insulin aspart  1-3 Units Subcutaneous Q4H   mouth rinse  15 mL Mouth Rinse 10 times per day   metoCLOPramide (REGLAN) injection  5 mg Intravenous Q8H   pantoprazole sodium  40 mg Per Tube Daily   polyethylene glycol  17 g Oral BID   rosuvastatin  40 mg Per Tube q1800   sennosides  10 mL Per Tube Daily   sodium chloride flush  10-40 mL Intracatheter Q12H   sodium chloride flush  3 mL Intravenous Q12H    Infusions:  sodium chloride Stopped (05/24/2021 0918)   sodium chloride     sodium chloride     amiodarone 60 mg/hr (06/03/2021 0700)   ceFEPime (MAXIPIME) IV Stopped (06/03/2021 1803)  dexmedetomidine (PRECEDEX) IV infusion 0.5 mcg/kg/hr (06/02/2021 0700)   epinephrine 8 mcg/min (06/18/2021 0700)    furosemide (LASIX) 200 mg in dextrose 5% 100 mL (51m/mL) infusion     heparin 25 units/mL (Impella PURGE) in dextrose 5 % 1000 mL bag     HYDROmorphone 6 mg/hr (06/09/2021 0700)   lactated ringers Stopped (05/27/2021 1900)   lactated ringers 10 mL/hr at 06/03/2021 0007   midazolam 5 mg/hr (06/19/2021 0700)   milrinone 0.5 mcg/kg/min (06/11/2021 0700)   norepinephrine (LEVOPHED) Adult infusion 40 mcg/min (06/09/2021 0700)   vancomycin Stopped (06/11/21 1950)   vasopressin 0.03 Units/min (06/09/2021 0700)    PRN Medications: sodium chloride, sodium chloride, sodium chloride, acetaminophen (TYLENOL) oral liquid 160 mg/5 mL, bisacodyl, HYDROmorphone, ipratropium-albuterol, metoprolol tartrate, midazolam, ondansetron (ZOFRAN) IV, polyvinyl alcohol, sodium chloride flush, sodium chloride flush   Assessment/Plan   Cardiogenic shock in setting of left main dissection and emergent CABG - chest open on V-A ECMO with Impella vent. S/p chest washout 6/17 - TEE 9/19 EF 35-40% - ECMO decannulated 6/20  - Impella at P-6. Flow 2.6 LDH 665 - On EPI 5 NE 40 milrinone 0.375 Output is marginal co-o% - She remains critically ill with MSOF. Barely making index. Discussed possible upgrade to Impella 5.5 with TCTS and family. Family is currently discussing. I am not sure this will make a profound difference in her outcome but other options are limited.  - Will need diuresis today. Start lasix gtt  2. CAD with acute due to left main dissection - s/p emergent CABG 06/03/2021 - continue ASA/statin  3. Acute respiratory failure due to above - On vent. Attempting to diurese as tolerated - CCM managing vent  4 AKI - due to shock/ATN - SCr baseline 1.7 -> 2.7 -> 2.8 - continue support. Keep MAPs > 70 - may need to consider CVVHD soon   5. Hypomag/hypokalemia - supp as needed  6. Anemia due to expected post-operative blood loss and ECMO circuit - transfuse to keep hgb >= 8.0.  7. Thrombocytopenia - due to critical  illness. PLTs 45k -> 32k - has h/o Von Willebrands deficiency - has received low-dose DDAVP - likely due to critical illness/Impella - HIT sent  8. Sepsis - developed high fevers on 6/21. Vanc/cefepime added - Cx remain NGTD  9. Atrial fibrillation, paroxysmal - back in NSR after DCCV 6/21 = continue amio  - would be very careful with heparin with open chest and low platelets  10. Post-operative ileus - first BM today - getting trickle feeds va Cor-trak     CRITICAL CARE Performed by: BGlori Bickers Total critical care time: 45 minutes  Critical care time was exclusive of separately billable procedures and treating other patients.  Critical care was necessary to treat or prevent imminent or life-threatening deterioration.  Critical care was time spent personally by me (independent of midlevel providers or residents) on the following activities: development of treatment plan with patient and/or surrogate as well as nursing, discussions with consultants, evaluation of patient's response to treatment, examination of patient, obtaining history from patient or surrogate, ordering and performing treatments and interventions, ordering and review of laboratory studies, ordering and review of radiographic studies, pulse oximetry and re-evaluation of patient's condition.    Length of Stay: 8   DGlori BickersMD 06/03/2021, 9:17 AM  Advanced Heart Failure Team Pager [phone](M-F; 7a - 4p)  Please contact [email or web address] for night-coverage after hours (4p -7a ) and weekends on [email or web address]

## 2021-06-13 NOTE — Progress Notes (Signed)
1 Day Post-Op Procedure(s) (LRB): STERNAL WOUND DEBRIDEMENT (N/A) Subjective: Intubated, sedated  Objective: Vital signs in last 24 hours: Temp:  [96.98 F (36.1 C)-99.14 F (37.3 C)] 97.88 F (36.6 C) (06/23 1109) Pulse Rate:  [65-134] 134 (06/23 1109) Cardiac Rhythm: Atrial fibrillation;Normal sinus rhythm (06/23 0400) Resp:  [0-30] 30 (06/23 1109) BP: (87-106)/(58-87) 87/58 (06/23 1109) SpO2:  [78 %-100 %] 91 % (06/23 1110) Arterial Line BP: (71-115)/(43-80) 97/67 (06/23 1015) FiO2 (%):  [50 %-100 %] 100 % (06/23 1110) Weight:  [99.9 kg] 99.9 kg (06/23 0500)  Hemodynamic parameters for last 24 hours: PAP: (24-37)/(17-25) 30/21 CVP:  [10 mmHg-67 mmHg] 16 mmHg PCWP:  [18 mmHg-22 mmHg] 19 mmHg CO:  [3 L/min-4.2 L/min] 3.4 L/min CI:  [1.6 L/min/m2-2.2 L/min/m2] 1.8 L/min/m2  Intake/Output from previous day: 06/22 0701 - 06/23 0700 In: 4036.2 [I.V.:2845.6; Blood:645; NG/GT:80; IV Piggyback:144.2] Out: 2445 [Urine:1650; Chest Tube:795] Intake/Output this shift: No intake/output data recorded.  Lungs: rhonchi bilaterally Abdomen: nondistended Extremities: edema 4+ Wound: open sternal wound, esmark in place  Lab Results: Recent Labs    06/09/2021 2150 06/09/2021 0357 06/06/2021 0359  WBC 14.7*  --  17.2*  HGB 8.2* 7.1* 7.7*  HCT 24.6* 21.0* 23.0*  PLT 47*  --  49*   BMET:  Recent Labs    06/09/2021 2150 06/15/2021 0357 05/29/2021 0359  NA 134* 135 133*  K 4.0 4.1 3.9  CL 102  --  105  CO2 22  --  21*  GLUCOSE 146*  --  140*  BUN 22*  --  23*  CREATININE 2.82*  --  2.81*  CALCIUM 7.9*  --  7.7*    PT/INR:  Recent Labs    06/20/2021 0359  LABPROT 18.9*  INR 1.6*   ABG    Component Value Date/Time   PHART 7.380 05/30/2021 0357   HCO3 21.5 06/03/2021 0357   TCO2 23 05/29/2021 0357   ACIDBASEDEF 3.0 (H) 06/03/2021 0357   O2SAT 41.7 06/03/2021 1229   CBG (last 3)  Recent Labs    06/09/2021 0355 06/02/2021 0816 06/11/2021 1224  GLUCAP 151* 140* 128*     Assessment/Plan: S/P Procedure(s) (LRB): STERNAL WOUND DEBRIDEMENT (N/A) Remains critically ill Cardiac index 1.8 - 2.2 with Impella on P6-7, flows about 2.5 L./min, milrinone 0.375, epi 8, norepi 40 Dr. Haroldine Laws discussed possibility of placement of 5.5 Impella via axillary artery with family, they are not in favor at this point. Creatinine stable but still elevated, not much response Lasix so far today. May need to consider CCVHD Pulmonary bronch'ed today and did remove a plug but still VDRF with high Aa gradient Fevers improved but WBC up slightly on Vanco and Maxipime     LOS: 8 days    [name] [name] 05/24/2021

## 2021-06-14 ENCOUNTER — Inpatient Hospital Stay (HOSPITAL_COMMUNITY): Payer: 59

## 2021-06-14 LAB — BASIC METABOLIC PANEL
Anion gap: 11 (ref 5–15)
Anion gap: 11 (ref 5–15)
BUN: 29 mg/dL — ABNORMAL HIGH (ref 6–20)
BUN: 31 mg/dL — ABNORMAL HIGH (ref 6–20)
CO2: 19 mmol/L — ABNORMAL LOW (ref 22–32)
CO2: 21 mmol/L — ABNORMAL LOW (ref 22–32)
Calcium: 7.4 mg/dL — ABNORMAL LOW (ref 8.9–10.3)
Calcium: 7.6 mg/dL — ABNORMAL LOW (ref 8.9–10.3)
Chloride: 100 mmol/L (ref 98–111)
Chloride: 99 mmol/L (ref 98–111)
Creatinine, Ser: 3.27 mg/dL — ABNORMAL HIGH (ref 0.44–1.00)
Creatinine, Ser: 3.7 mg/dL — ABNORMAL HIGH (ref 0.44–1.00)
GFR, Estimated: 16 mL/min — ABNORMAL LOW (ref >60)
GFR, Estimated: 13 mL/min — ABNORMAL LOW (ref >60)
Glucose, Bld: 138 mg/dL — ABNORMAL HIGH (ref 70–99)
Glucose, Bld: 142 mg/dL — ABNORMAL HIGH (ref 70–99)
Potassium: 4.2 mmol/L (ref 3.5–5.1)
Potassium: 4.2 mmol/L (ref 3.5–5.1)
Sodium: 130 mmol/L — ABNORMAL LOW (ref 135–145)
Sodium: 131 mmol/L — ABNORMAL LOW (ref 135–145)

## 2021-06-14 LAB — GLUCOSE, CAPILLARY
Glucose-Capillary: 144 mg/dL — ABNORMAL HIGH (ref 70–99)
Glucose-Capillary: 183 mg/dL — ABNORMAL HIGH (ref 70–99)
Glucose-Capillary: 218 mg/dL — ABNORMAL HIGH (ref 70–99)
Glucose-Capillary: 143 mg/dL — ABNORMAL HIGH (ref 70–99)
Glucose-Capillary: 182 mg/dL — ABNORMAL HIGH (ref 70–99)

## 2021-06-14 LAB — POCT I-STAT 7, (LYTES, BLD GAS, ICA,H+H)
Acid-base deficit: 5 mmol/L — ABNORMAL HIGH (ref 0.0–2.0)
Bicarbonate: 20.4 mmol/L (ref 20.0–28.0)
Calcium, Ion: 1.12 mmol/L — ABNORMAL LOW (ref 1.15–1.40)
HCT: 22 % — ABNORMAL LOW (ref 36.0–46.0)
Hemoglobin: 7.5 g/dL — ABNORMAL LOW (ref 12.0–15.0)
O2 Saturation: 93 %
Patient temperature: 37.1
Potassium: 4.3 mmol/L (ref 3.5–5.1)
Sodium: 132 mmol/L — ABNORMAL LOW (ref 135–145)
TCO2: 22 mmol/L (ref 22–32)
pCO2 arterial: 38.6 mmHg (ref 32.0–48.0)
pH, Arterial: 7.332 — ABNORMAL LOW (ref 7.350–7.450)
pO2, Arterial: 71 mmHg — ABNORMAL LOW (ref 83.0–108.0)

## 2021-06-14 LAB — HEPATIC FUNCTION PANEL
ALT: 6 U/L (ref 0–44)
AST: 59 U/L — ABNORMAL HIGH (ref 15–41)
Albumin: 2 g/dL — ABNORMAL LOW (ref 3.5–5.0)
Alkaline Phosphatase: 75 U/L (ref 38–126)
Bilirubin, Direct: 0.9 mg/dL — ABNORMAL HIGH (ref 0.0–0.2)
Indirect Bilirubin: 0.8 mg/dL (ref 0.3–0.9)
Total Bilirubin: 1.7 mg/dL — ABNORMAL HIGH (ref 0.3–1.2)
Total Protein: 4.6 g/dL — ABNORMAL LOW (ref 6.5–8.1)

## 2021-06-14 LAB — CBC
HCT: 22.3 % — ABNORMAL LOW (ref 36.0–46.0)
Hemoglobin: 7.6 g/dL — ABNORMAL LOW (ref 12.0–15.0)
MCH: 30 pg (ref 26.0–34.0)
MCHC: 34.1 g/dL (ref 30.0–36.0)
MCV: 88.1 fL (ref 80.0–100.0)
Platelets: 64 10*3/uL — ABNORMAL LOW (ref 150–400)
RBC: 2.53 MIL/uL — ABNORMAL LOW (ref 3.87–5.11)
RDW: 17.3 % — ABNORMAL HIGH (ref 11.5–15.5)
WBC: 24.2 10*3/uL — ABNORMAL HIGH (ref 4.0–10.5)
nRBC: 2.7 % — ABNORMAL HIGH (ref 0.0–0.2)

## 2021-06-14 LAB — CULTURE, RESPIRATORY W GRAM STAIN

## 2021-06-14 LAB — LACTATE DEHYDROGENASE: LDH: 769 U/L — ABNORMAL HIGH (ref 98–192)

## 2021-06-14 LAB — PROTIME-INR
INR: 1.6 — ABNORMAL HIGH (ref 0.8–1.2)
Prothrombin Time: 19.4 seconds — ABNORMAL HIGH (ref 11.4–15.2)

## 2021-06-14 LAB — FIBRINOGEN: Fibrinogen: 769 mg/dL — ABNORMAL HIGH (ref 210–475)

## 2021-06-14 LAB — COOXEMETRY PANEL
Carboxyhemoglobin: 0.8 % (ref 0.5–1.5)
Methemoglobin: 1.1 % (ref 0.0–1.5)
O2 Saturation: 45.7 %
Total hemoglobin: 8.2 g/dL — ABNORMAL LOW (ref 12.0–16.0)

## 2021-06-14 LAB — PHOSPHORUS: Phosphorus: 5.8 mg/dL — ABNORMAL HIGH (ref 2.5–4.6)

## 2021-06-14 LAB — MAGNESIUM: Magnesium: 2.8 mg/dL — ABNORMAL HIGH (ref 1.7–2.4)

## 2021-06-14 LAB — HEPARIN LEVEL (UNFRACTIONATED): Heparin Unfractionated: <0.1 IU/mL — ABNORMAL LOW (ref 0.30–0.70)

## 2021-06-14 LAB — APTT: aPTT: 56 seconds — ABNORMAL HIGH (ref 24–36)

## 2021-06-14 MED ORDER — HYDROMORPHONE HCL 1 MG/ML IJ SOLN: 1.0000 mg | Freq: Once | INTRAMUSCULAR | Status: DC

## 2021-06-14 MED ORDER — SODIUM CHLORIDE 0.9% FLUSH
3.0000 mL | Freq: Two times a day (BID) | INTRAVENOUS | Status: DC
  Administered 2021-06-14: 3 mL via INTRAVENOUS
  Administered 2021-06-15: 10 mL via INTRAVENOUS
  Administered 2021-06-16: 3 mL via INTRAVENOUS

## 2021-06-14 MED ORDER — HYDROMORPHONE BOLUS VIA INFUSION: 1.0000 mg | INTRAVENOUS | Status: DC | PRN

## 2021-06-14 MED ORDER — CHLORHEXIDINE GLUCONATE 0.12 % MT SOLN
OROMUCOSAL | Status: AC
  Administered 2021-06-14: 15 mL via OROMUCOSAL
  Filled 2021-06-14: qty 15

## 2021-06-14 MED ORDER — SODIUM CHLORIDE 0.9% FLUSH: 3.0000 mL | INTRAVENOUS | Status: DC | PRN

## 2021-06-14 MED ORDER — INSULIN ASPART 100 UNIT/ML IJ SOLN
0.0000 [IU] | INTRAMUSCULAR | Status: DC
  Administered 2021-06-14: 1 [IU] via SUBCUTANEOUS
  Administered 2021-06-14 – 2021-06-15 (×2): 2 [IU] via SUBCUTANEOUS
  Administered 2021-06-15: 1 [IU] via SUBCUTANEOUS
  Administered 2021-06-15 (×2): 2 [IU] via SUBCUTANEOUS
  Administered 2021-06-15: 1 [IU] via SUBCUTANEOUS
  Administered 2021-06-15: 3 [IU] via SUBCUTANEOUS
  Administered 2021-06-16 (×5): 1 [IU] via SUBCUTANEOUS
  Administered 2021-06-16 – 2021-06-17 (×2): 2 [IU] via SUBCUTANEOUS
  Administered 2021-06-17: 3 [IU] via SUBCUTANEOUS
  Administered 2021-06-17: 1 [IU] via SUBCUTANEOUS

## 2021-06-14 MED ORDER — TRACE MINERALS CU-MN-SE-ZN 300-55-60-3000 MCG/ML IV SOLN
INTRAVENOUS | Status: AC
  Filled 2021-06-14: qty 369.6

## 2021-06-14 MED ORDER — SODIUM CHLORIDE 0.9 % IV SOLN
0.5000 mg/h | INTRAVENOUS | Status: DC
  Administered 2021-06-14: 4 mg/h via INTRAVENOUS
  Filled 2021-06-14: qty 20

## 2021-06-14 MED ORDER — AMIODARONE LOAD VIA INFUSION
150.0000 mg | Freq: Once | INTRAVENOUS | Status: AC
  Administered 2021-06-14: 150 mg via INTRAVENOUS
  Filled 2021-06-14: qty 83.34

## 2021-06-14 MED ORDER — AMIODARONE IV BOLUS ONLY 150 MG/100ML: 150.0000 mg | Freq: Once | INTRAVENOUS | Status: DC

## 2021-06-14 MED ORDER — HYDROMORPHONE BOLUS VIA INFUSION
0.2500 mg | INTRAVENOUS | Status: DC | PRN
  Administered 2021-06-15 (×2): 0.5 mg via INTRAVENOUS
  Administered 2021-06-15: 2 mg via INTRAVENOUS
  Filled 2021-06-14: qty 2

## 2021-06-14 NOTE — Progress Notes (Signed)
[place] for IV Heparin Indication:  Impella , Afib  No Known Allergies  Patient Measurements: Height: 5\' 3"  (160 cm) Weight: 99.3 kg (218 lb 14.7 oz) IBW/kg (Calculated) : 52.4 Heparin Dosing Weight: 72 kg  Vital Signs: Temp: 99.14 F (37.3 C) (06/24 0736) Temp Source: Core (06/24 0800) BP: 104/72 (06/24 1525) Pulse Rate: 97 (06/24 1525)  Labs: Recent Labs    06/16/2021 0037 06/16/2021 0330 06/19/2021 0459 06/15/2021 0359 05/31/2021 0954 05/29/2021 1720 06/03/2021 1721 06/14/21 0322 06/14/21 0326  HGB  --  9.2*   < > 7.7*  --  7.7*  --  7.5* 7.6*  HCT  --  28.1*   < > 23.0*  --  23.3*  --  22.0* 22.3*  PLT  --  45*   < > 49*  --  50*  --   --  64*  APTT  --  110*  --  64*  --   --   --   --  56*  LABPROT  --  17.7*  --  18.9*  --   --   --   --  19.4*  INR  --  1.5*  --  1.6*  --   --   --   --  1.6*  HEPARINUNFRC 0.11*  --   --   --  <0.10*  --   --   --  <0.10*  CREATININE  --  2.70*   < > 2.81*  --   --  3.02*  --  3.27*   < > = values in this interval not displayed.     Estimated Creatinine Clearance: 20.6 mL/min (A) (by C-G formula based on SCr of 3.27 mg/dL (H)).   Medical History: Past Medical History:  Diagnosis Date   Arthritis    Brain aneurysm    Diabetes mellitus without complication ([place])    Headache    Hypertension        SVT (supraventricular tachycardia) ([place])    s/p radiofrequency catheter ablation for AVNRT 02/16/09 (Dr. Cristopher Peru)   Thyroid disease    PROCEDURE FOR THYROID 15 YRS AGO AT [facility]   WPW (Wolff-Parkinson-White syndrome)     Medications:  Infusions:   sodium chloride Stopped (06/19/2021 0918)   sodium chloride     sodium chloride     amiodarone 60 mg/hr (06/14/21 1559)   ceFEPime (MAXIPIME) IV Stopped (06/11/2021 1731)   dexmedetomidine (PRECEDEX) IV infusion 0.4 mcg/kg/hr (06/14/21 1236)   epinephrine 5 mcg/min (06/14/21 1200)   furosemide (LASIX) 200 mg in dextrose 5% 100 mL (2mg /mL) infusion  12 mg/hr (06/14/21 1200)   heparin 25 units/mL (Impella PURGE) in dextrose 5 % 1000 mL bag     HYDROmorphone 6 mg/hr (06/14/21 1200)   lactated ringers Stopped (05/25/2021 1900)   lactated ringers 10 mL/hr at 05/29/2021 0007   midazolam 6 mg/hr (06/14/21 1600)   milrinone 0.125 mcg/kg/min (06/14/21 1200)   norepinephrine (LEVOPHED) Adult infusion 41 mcg/min (06/14/21 1500)   TPN ADULT (ION)     vancomycin Stopped (06/06/2021 2104)   vasopressin 0.04 Units/min (06/14/21 1409)    Assessment:  89 yoF admitted with cardiogenic shock 2/2 LM SCAD. Pt s/p CABG with VA-ECMO and Impella CP placement. ECMO decannulated on 6/20. Continues in A-flutter  Pt has heparin half-strength purge (25 uts/hr)  infusing 35ml/hr (400 uts/hr ) <HL 0.1 this am aptt 56sec h/h low this am 7.7/23  pltc low but increasing 49>64. S/p open chest  and washout 6/23 still CT output  HIT negative 6/21 Systemic heparin on hold for now   Goal of Therapy:  Heparin level 0.3-0.5  Monitor platelets by anticoagulation protocol: Yes   Plan:  -Continue half-strength heparinized purge (25 units/ml) Daily heparin level CBC   Bonnita Nasuti Pharm.D. CPP, BCPS Clinical Pharmacist [phone] 06/14/2021 4:48 PM

## 2021-06-14 NOTE — Progress Notes (Signed)
2 Days Post-Op Procedure(s) (LRB): STERNAL WOUND DEBRIDEMENT (N/A) Subjective: intubated  Objective: Vital signs in last 24 hours: Temp:  [96.98 F (36.1 C)-99.32 F (37.4 C)] 99.14 F (37.3 C) (06/24 0700) Pulse Rate:  [55-134] 97 (06/24 0700) Cardiac Rhythm: Normal sinus rhythm;Sinus tachycardia;Atrial flutter;Supraventricular tachycardia (06/24 0400) Resp:  [19-35] 23 (06/24 0700) BP: (82-104)/(52-79) 104/71 (06/24 0700) SpO2:  [78 %-100 %] 100 % (06/24 0700) Arterial Line BP: (65-130)/(40-79) 96/62 (06/24 0700) FiO2 (%):  [100 %] 100 % (06/24 0411) Weight:  [99.3 kg] 99.3 kg (06/24 0500)  Hemodynamic parameters for last 24 hours: PAP: (27-53)/(18-37) 34/21 CVP:  [10 mmHg-32 mmHg] 14 mmHg PCWP:  [20 mmHg-21 mmHg] 21 mmHg CO:  [3.4 L/min-4.8 L/min] 4.1 L/min CI:  [1.8 L/min/m2-2.5 L/min/m2] 2.1 L/min/m2  Intake/Output from previous day: 06/23 0701 - 06/24 0700 In: 3896.7 [I.V.:2895.1; NG/GT:246.3; IV Piggyback:350] Out: 6979 [Urine:2015; Emesis/NG output:1550; Stool:400; Chest Tube:760] Intake/Output this shift: No intake/output data recorded.  Heart: irregularly irregular rhythm Lungs: rhonchi bilaterally Abdomen: mildly distended Extremities: edema 4+ Wound: esmark in place, no evidence of bleeding underneath  Lab Results: Recent Labs    06/09/2021 1720 06/14/21 0322 06/14/21 0326  WBC 19.5*  --  24.2*  HGB 7.7* 7.5* 7.6*  HCT 23.3* 22.0* 22.3*  PLT 50*  --  64*   BMET:  Recent Labs    05/26/2021 1721 06/14/21 0322 06/14/21 0326  NA 131* 132* 130*  K 4.2 4.3 4.2  CL 102  --  100  CO2 20*  --  19*  GLUCOSE 132*  --  138*  BUN 28*  --  29*  CREATININE 3.02*  --  3.27*  CALCIUM 7.4*  --  7.4*    PT/INR:  Recent Labs    06/14/21 0326  LABPROT 19.4*  INR 1.6*   ABG    Component Value Date/Time   PHART 7.332 (L) 06/14/2021 0322   HCO3 20.4 06/14/2021 0322   TCO2 22 06/14/2021 0322   ACIDBASEDEF 5.0 (H) 06/14/2021 0322   O2SAT 45.7 06/14/2021  0326   CBG (last 3)  Recent Labs    05/25/2021 1957 06/20/2021 2333 06/14/21 0321  GLUCAP 133* 132* 144*    Assessment/Plan: S/P Procedure(s) (LRB): STERNAL WOUND DEBRIDEMENT (N/A) Remains critically ill CV- in and out of A fib- RVR currently- rebolus amiodarone  Impella flowing 2.8, with CI 1.8-2.0, co-ox 46  Epi 5, Norepi 43, milrinone 0.25 RESP- VDRf, on 90% and 10 PEEP, likely HAP ID- on vanco and Maxipime, WBC rising RENAL- creatinine continues to rise.  Slightly negative with IV Lasix GI- tolerating TF DVT prophylaxis- SCD in place, on heparin for Impella Prognosis remains guarded  LOS: 9 days    Melrose Nakayama 06/14/2021

## 2021-06-14 NOTE — Progress Notes (Signed)
Nutrition Follow-up  DOCUMENTATION CODES:   Not applicable  INTERVENTION:   TPN to meet nutritional needs-goal is to meet 100% protein needs while minimizing fluid; TPN order per Pharmacy  Plan to attempt to advance Cortrak tube further into small intestine.  Trial of trickle TF as able. Pivot 1.5 at 20 ml/hr, goal rate of 60 ml/hr   NUTRITION DIAGNOSIS:   Inadequate oral intake related to acute illness as evidenced by NPO status.  Being addressed via   GOAL:   Patient will meet greater than or equal to 90% of their needs  Progressing  MONITOR:   Vent status, TF tolerance, Labs, Weight trends  REASON FOR ASSESSMENT:   Consult, Ventilator Enteral/tube feeding initiation and management (ECMO)  ASSESSMENT:   60 yo female admitted with chest pain with STEMI requiring emergent CABG, cardiogenic shock in setting of left main dissection requiring VA ECMO, intubation PMH includes DM, HTN, HLD, CVA  6/14 Admitted 6/15 Cannulated for VA ECMO, Intubated 6/16 TF initiated at 20 ml/hr 6/19 Trickle TF held, OG-LIS 6/20 OR-VA decannulation 6/21 Trickle TF restarted, did not tolerate, bile coming from mouth overnight-OG-LIS with 1200 mL out 6/22 Bedside chest washout with evacuation of mediastinal clot for cardiac tamponade, Cortrak placed gastric-unable to advance to postpyloric position 6/23 +BM finally, trickle TF restarted but held again after pt vomited, OG placed to LIS with 1100 mL dark output  Pt remains on vent support, Impella in place, chest open.  Requiring levophed, vasopressin and epinephrine.   Trickle TF restarted again yesterday after patient had BM.  Yesterday pt had small amount of emesis, OG placed to suction with 1100 mL dark brown contents removed. TF held  Inadequate nutrition (NPO or trickle TF only) x 10 days  Discussed nutrition poc with CCM today; recommend initiation of TPN at this time given continued intolerance to EN. MD in agreement with this  plan. Plan to attempt advancement of Cortrak tube into small intestine  Abd xray with no stool noted in colon, no ileus, no acute abnormalities. NG in distal stomach, Cortrak possible in duodenal bulb  Rectal tube in place with positive stool output  Labs: sodium 130 (L), phosphorus 5.8 (H), magnesium 2.8 (H) Meds: lasix gtt, ss novolog, miralax    Diet Order:   Diet Order             Diet NPO time specified  Diet effective now                   EDUCATION NEEDS:   Not appropriate for education at this time  Skin:  Skin Assessment: Skin Integrity Issues: Skin Integrity Issues:: Incisions Incisions: open sternum  Last BM:  6/23  Height:   Ht Readings from Last 1 Encounters:  06/20/2021 5\' 3"  (1.6 m)    Weight:   Wt Readings from Last 1 Encounters:  06/14/21 99.3 kg    BMI:  Body mass index is 38.78 kg/m.  Estimated Nutritional Needs:   Kcal:  2100-2300 kcals  Protein:  125-140 g  Fluid:  >/= 1.9 L  Kerman Passey MS, RDN, LDN, CNSC Registered Dietitian III Clinical Nutrition RD Pager and On-Call Pager Number Located in [place]

## 2021-06-14 NOTE — Procedures (Signed)
Bronchoscopy Procedure Note  [name] [name]  [id number]  [birth year]  Date:06/14/21  Time:2:40 PM   Provider Performing:Jleigh Striplin   Procedure(s):  Subsequent Therapeutic Aspiration of Tracheobronchial Tree [id number])  Indication(s) Hypoxia  Consent Unable to obtain consent due to emergent nature of procedure.  Anesthesia Patient on sedative infusions   Time Out Verified patient identification, verified procedure, site/side was marked, verified correct patient position, special equipment/implants available, medications/allergies/relevant history reviewed, required imaging and test results available.   Sterile Technique Usual hand hygiene, masks, gowns, and gloves were used   Procedure Description Bronchoscope advanced through endotracheal tube and into airway.  Airways were examined down to subsegmental level with findings noted below.   Following diagnostic evaluation, Therapeutic aspiration performed in left lower lobe  Findings: Minimal white secretions   Complications/Tolerance None; patient tolerated the procedure well. Chest X-ray is not needed post procedure.   EBL None   Specimen(s) None  [name] Brood, [name] [name] [facility] ICU Physician [name] [name]  Pager: [phone] Or Epic Secure Chat After hours: [phone].  06/14/2021, 2:42 PM

## 2021-06-14 NOTE — Progress Notes (Signed)
ECMO PROGRESS NOTE  NAME:  [name] [name], MRN:  [id number], DOB:  Dec 09, 1961, LOS: 9 ADMISSION DATE:  06/15/2021, CONSULTATION DATE: 06/02/2021 REFERRING MD: Roxan Hockey, CHIEF COMPLAINT: VA ECMO  HPI/course in hospital   60 year old woman with hx of HTN, HLD, hypothyroidism, CVA, DM2 who presented with angina.  Underwent cardiac cath which showed left main dissection, during this procedure became acutely unstable with crushing chest pain and shock.  Taken emergently to OR for bypass after 3-5 impella placed.  Tough to wean from bypass so left centrally cannulated on VA ECMO and sent to [place].  PCCM consulted to help with management.  Past Medical History   Past Medical History:  Diagnosis Date   Arthritis    Brain aneurysm    Diabetes mellitus without complication (H[facility])    Headache    Hypertension        SVT (supraventricular tachycardia) ([place])    s/p radiofrequency catheter ablation for AVNRT 02/16/09 (Dr. Cristopher Peru)   Thyroid disease    PROCEDURE FOR THYROID 15 YRS AGO AT [facility]   WPW (Wolff-Parkinson-White syndrome)      Past Surgical History:  Procedure Laterality Date   BREAST SURGERY     BX RT BREAST  BENIGN   CANNULATION FOR ECMO (EXTRACORPOREAL MEMBRANE OXYGENATION) N/A 05/28/2021   Procedure: DECANNULATION FOR ECMO (EXTRACORPOREAL MEMBRANE OXYGENATION);  Surgeon: Melrose Nakayama, MD;  Location: [facility];  Service: Open Heart Surgery;  Laterality: N/A;   CORONARY ARTERY BYPASS GRAFT N/A 05/28/2021   Procedure: CORONARY ARTERY BYPASS GRAFTING (CABG)X2.USING  LEFT INTERNAL MAMMARY ARTERY AND  RIGHT ENDOSCOPIC SAPHENOUS VEIN HARVESTING. ECMO INSERTION;  Surgeon: Melrose Nakayama, MD;  Location: [facility];  Service: Open Heart Surgery;  Laterality: N/A;   CRANIOTOMY N/A 01/04/2016   Procedure: Suboccipital Craniotomy and Cervical one Laminectomy for Clipping of Aneurysm;  Surgeon: Kevan Ny Ditty, MD;  Location: [facility];  Service: Neurosurgery;  Laterality: N/A;    CRANIOTOMY N/A 07/09/2016   Procedure: Craniotomy for clipping of left middle cerebral artery aneurysm;  Surgeon: Kevan Ny Ditty, MD;  Location: [place] [facility];  Service: Neurosurgery;  Laterality: N/A;  Craniotomy for 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 Christella Noa;  Surgeon: Kevan Ny Ditty, MD;  Location: [place];  Service: Neurosurgery;  Laterality: Right;   ELECTROPHYSIOLOGIC STUDY     LAPAROSCOPIC REVISION VENTRICULAR-PERITONEAL (V-P) SHUNT N/A 02/04/2016   Procedure: LAPAROSCOPIC Insertion VENTRICULAR-PERITONEAL (V-P) SHUNT;  Surgeon: Rolm Bookbinder, MD;  Location: [place] [facility];  Service: General;  Laterality: N/A;   LEFT HEART CATH AND CORONARY ANGIOGRAPHY N/A 05/22/2021   Procedure: LEFT HEART CATH AND CORONARY ANGIOGRAPHY;  Surgeon: Jettie Booze, MD;  Location: [facility];  Service: Cardiovascular;  Laterality: N/A;   RADIOLOGY WITH ANESTHESIA N/A 01/03/2016   Procedure: RADIOLOGY WITH ANESTHESIA;  Surgeon: Medication Radiologist, MD;  Location: [facility];  Service: Radiology;  Laterality: N/A;   STERNAL WOUND DEBRIDEMENT N/A 06/06/2021   Procedure: Reexploration and evacuation of hematoma;  Surgeon: Melrose Nakayama, MD;  Location: [facility];  Service: Thoracic;  Laterality: N/A;   STERNAL WOUND DEBRIDEMENT N/A 06/08/2021   Procedure: STERNAL WOUND DEBRIDEMENT;  Surgeon: Melrose Nakayama, MD;  Location: [place];  Service: Thoracic;  Laterality: N/A;   TEE WITHOUT CARDIOVERSION N/A 05/29/2021   Procedure: TRANSESOPHAGEAL ECHOCARDIOGRAM (TEE);  Surgeon: Melrose Nakayama,  MD;  [facility];  Service: Open Heart Surgery;  Laterality: N/A;   VENTRICULAR ASSIST DEVICE INSERTION N/A 06/19/2021   Procedure: VENTRICULAR ASSIST DEVICE INSERTION;  Surgeon: Jettie Booze, MD;  Location: [facility];  Service: Cardiovascular;   Laterality: N/A;   VENTRICULOPERITONEAL SHUNT Right 02/04/2016   Procedure: SHUNT INSERTION VENTRICULAR-PERITONEAL With Laparoscopic Assistance;  Surgeon: Kevan Ny Ditty, MD;  Location: [facility];  Service: Neurosurgery;  Laterality: Right;     Significant Hospital events:  6/14 admitted 6/15 spontaneous artery dissection left main on cath.CP Impella placed.   6/15 emergent coronary artery bypass graft, LIMA to LAD, SVG to OM.  Brief cardiac arrest of less than 10 minutes immediately prior to initiation of cardiopulmonary bypass. 6/15 unable to wean.  Still unable to wean from bypass so placed on centrally cannulated VA ECMO. 6/16 EEG shows encephalopathy.  6/16 Cold right hand - seen by vascular, conservative management 6/17 Hand improved. 780m clot removed with washout at the bedside  6/19 RUL collapse - underwent bronchoscopy and aspiration of RUL mucus plug.  6/20 ECMO Decannulation 6/21>> BIS monitoring started , weaning of sedation, wet CXR 6/22 bedside chest washout; started on aggressive laxatives for severe constipation. 6/23 ongoing hemodynamic instability complicated by ectopy. 6/24 tolerating fluid removal with furosemide infusion.,  Oxygenation improving, slight decrease in vasopressor requirements  Interim history/subjective:   Continues to require high vasopressors.  Ongoing OG aspiration of feculent material.  Oxygenation is improving.  Continues to stool.  He continues to have ectopy.  Tolerating diuresis.  Objective   Blood pressure 104/71, pulse 100, temperature 99.14 F (37.3 C), resp. rate (!) 23, height _0  (1.6 m), weight 99.3 kg, SpO2 93 %. PAP: (27-53)/(18-37) 34/21 CVP:  [10 mmHg-32 mmHg] 14 mmHg PCWP:  [20 mmHg-21 mmHg] 21 mmHg CO:  [3.7 L/min-4.8 L/min] 4 L/min CI:  [1.9 L/min/m2-2.5 L/min/m2] 2.1 L/min/m2  Vent Mode: PRVC FiO2 (%):  [80 %-100 %] 80 % Set Rate:  [22 bmp] 22 bmp Vt Set:  [410 mL] 410 mL PEEP:  [5 cmH20-10 cmH20] 10  cmH20 Plateau Pressure:  [19 cmH20-26 cmH20] 26 cmH20   Intake/Output Summary (Last 24 hours) at 06/14/2021 1442 Last data filed at 06/14/2021 1200 Gross per 24 hour  Intake 3916.5 ml  Output 4505 ml  Net -588.5 ml    Filed Weights   06/11/2021 0500 05/29/2021 0500 06/14/21 0500  Weight: 96.9 kg 99.9 kg 99.3 kg    Physical Exam Constitutional:      Appearance: She is obese. Open chest HENT:     Head: Normocephalic.  Eyes:     Comments: sclerae intact Neck:     Comments: ET tube and OG tube in place Cardiovascular:     Rate and Rhythm: Intermittent atrial fibrillation    Heart sounds: Heart sounds obscured by chest dressing    Comments: Open chest with chest tubes, Eshmark tense with serosanguineous fluid.,  All extremities warm.  Marked generalized edema. Pulmonary:     Breath sounds: Rhonchi throughout, diminished per bases    Comments: Mechanically ventilated with acceptable airway pressures.  Abdominal:     Palpations: Abdomen is soft.  Feculent OG output.  Loose bowel movements Skin:    General: Skin is warm and dry.     Capillary Refill: Capillary refill takes 2 to 3 seconds.  Neurological:     Comments: Sedated.  No movement to voice or spontaneously  Ancillary tests (personally reviewed)  CBC: Recent Labs  Lab 06/20/2021 0330 06/02/2021 0459 05/25/2021 2150 05/30/2021 0357 05/31/2021 0359 06/18/2021 1720 06/14/21 0322 06/14/21 0326  WBC 13.3*  --  14.7*  --  17.2* 19.5*  --  24.2*  HGB 9.2*   < > 8.2* 7.1* 7.7* 7.7* 7.5* 7.6*  HCT 28.1*   < > 24.6* 21.0* 23.0* 23.3* 22.0* 22.3*  MCV 89.5  --  90.1  --  89.8 89.6  --  88.1  PLT 45*  --  47*  --  49* 50*  --  64*   < > = values in this interval not displayed.     Basic Metabolic Panel: Recent Labs  Lab 06/11/21 0331 06/11/21 0542 06/11/2021 0330 06/14/2021 0459 06/11/2021 1550 05/30/2021 2150 06/16/2021 0357 06/15/2021 0359 05/22/2021 1721 06/14/21 0322 06/14/21 0326  NA 136   < > 133*   < > 133* 134* 135 133* 131*  132* 130*  K 4.5   < > 4.3   < > 3.8 4.0 4.1 3.9 4.2 4.3 4.2  CL 108   < > 104  --  103 102  --  105 102  --  100  CO2 21*   < > 20*  --  20* 22  --  21* 20*  --  19*  GLUCOSE 102*   < > 141*  --  135* 146*  --  140* 132*  --  138*  BUN 16   < > 20  --  21* 22*  --  23* 28*  --  29*  CREATININE 1.88*   < > 2.70*  --  2.72* 2.82*  --  2.81* 3.02*  --  3.27*  CALCIUM 8.1*   < > 7.8*  --  7.6* 7.9*  --  7.7* 7.4*  --  7.4*  MG 1.7  --  2.2  --   --   --   --   --   --   --  2.8*  PHOS  --   --   --   --   --   --   --   --   --   --  5.8*   < > = values in this interval not displayed.    GFR: Estimated Creatinine Clearance: 20.6 mL/min (A) (by C-G formula based on SCr of 3.27 mg/dL (H)). Recent Labs  Lab 06/09/2021 0323 05/25/2021 0921 06/01/2021 1649 06/09/2021 2150 05/22/2021 0359 06/11/2021 1720 06/14/21 0326  WBC  --  9.2   < > 14.7* 17.2* 19.5* 24.2*  LATICACIDVEN 0.7 0.9  --   --   --   --   --    < > = values in this interval not displayed.     Liver Function Tests: Recent Labs  Lab 06/03/2021 0322 06/11/21 0331 05/28/2021 0330 05/28/2021 0359 06/14/21 0326  AST 50* 74* 75* 59* 59*  ALT _0 ALKPHOS 45 62 65 67 75  BILITOT 0.6 0.8 1.0 1.3* 1.7*  PROT 4.1* 4.6* 4.8* 4.5* 4.6*  ALBUMIN 2.1* 2.5* 2.4* 2.0* 2.0*    No results for input(s): LIPASE, AMYLASE in the last 168 hours. No results for input(s): AMMONIA in the last 168 hours.  ABG    Component Value Date/Time   PHART 7.332 (L) 06/14/2021 0322   PCO2ART 38.6 06/14/2021 0322   PO2ART 71 (L) 06/14/2021 0322   HCO3 20.4 06/14/2021 0322   TCO2 22 06/14/2021 0322   ACIDBASEDEF 5.0 (H) 06/14/2021 0322   O2SAT 45.7 06/14/2021  [id number]      Coagulation Profile: Recent Labs  Lab 06/08/2021 0322 06/11/21 0331 05/26/2021 0330 06/03/2021 0359 06/14/21 0326  INR 1.3* 1.2 1.5* 1.6* 1.6*     Cardiac Enzymes: No results for input(s): CKTOTAL, CKMB, CKMBINDEX, TROPONINI in the last 168 hours.  HbA1C: Hgb A1c MFr Bld   Date/Time Value Ref Range Status  06/09/2021 10:03 AM 5.4 4.8 - 5.6 % Final    Comment:    (NOTE)         Prediabetes: 5.7 - 6.4         Diabetes: >6.4         Glycemic control for adults with diabetes: <7.0     CBG: Recent Labs  Lab 06/20/2021 1957 06/03/2021 2333 06/14/21 0321 06/14/21 0825 06/14/21 1159  GLUCAP 133* 132* 144* 183* 218*   CXR: Bilateral effusions.  Left lower lobe segmental collapse, retrocardiac. KUB is nondiagnostic  Assessment & Plan:  Critically ill due to cardiogenic shock secondary to spontaneous coronary dissection of the left main requiring VA ECMO support and titration of vasopressors and inotropes. CP Impella to offload LV. Status post two-vessel bypass Ongoing slow bleeding due to platelet dysfunction.  RUL collapse AKI due to hypotension -has worsened today following diuresis Shock liver due to hypotension - improving Multifactorial thrombocytopenia - improving Status post PEA cardiac arrest Uncontrolled type 2 diabetes Hypo mag  Plan: -Continue current level of Impella support-suction events occur above P6 -Continue current ventilator settings oxygenation improved with PEEP 10.  Pulmonary toilet will be easier once chest closed. -Stop milrinone to limit ventricular ectopy.  Preferentially wean norepinephrine. -Wean vasopressors as tolerated to keep MAP greater than 65 -Continue empiric antibiotics.  Follow cultures negative so far. -Continue amiodarone IV. -Slow down bowel regimen -Furosemide infusion to attempt to diurese, adequate urine output. -We will remove Swan-Ganz catheter and place triple-lumen through introducer for additional access. -Delayed recovery of bowel function, will start TPN.  Challenge patient over the weekend with trickle feeds.  Daily Goals Checklist  Pain/Anxiety/Delirium protocol (if indicated): Dilaudid, Precedex and Versed to keep RASS goal -3 VAP protocol (if indicated): Bundle in place Respiratory support  goals: Rest ventilator settings.  No weaning at this time. Blood pressure target: MAP greater than 65, SCV O2 greater than 6 DVT prophylaxis: Prophylactic heparin.  Nutritional status and feeding goals: .  Starting TPN.  Trial of trophic feeds.  GI prophylaxis pantoprazole.  Fluid status goals: Started furosemide infusion Urinary catheter: Assessment of intravascular volume Central lines: Right IJ MAC introducer, left radial art line Glucose control:  CBG and  [place] insulin.  Mobility/therapy needs: Bedrest Antibiotic de-escalation: Cefepime and vancomycin Home medication reconciliation: On hold Daily labs: Daily CBC BMP. Code Status: Full code Family Communication: Updated by Dr. Haroldine Laws Disposition: ICU  CRITICAL CARE Performed by: Kipp Brood  Total critical care time: 45 minutes  Critical care time was exclusive of separately billable procedures and treating other patients.  Critical care was necessary to treat or prevent imminent or life-threatening deterioration.  Critical care was time spent personally by me on the following activities: development of treatment plan with patient and/or surrogate as well as nursing, discussions with consultants, evaluation of patient's response to treatment, examination of patient, obtaining history from patient or surrogate, ordering and performing treatments and interventions, ordering and review of laboratory studies, ordering and review of radiographic studies, pulse oximetry, re-evaluation of patient's condition and participation in multidisciplinary rounds.  Kipp Brood, MD [facility] ICU Physician [place]  [facility]  Pager: [phone] Or Epic Secure Chat After hours: [phone].  06/14/2021, 2:42 PM

## 2021-06-14 NOTE — Progress Notes (Signed)
ECMO PROGRESS NOTE  NAME:  IVADELL GAUL, MRN:  [id number], DOB:  Dec 09, 1961, LOS: 9 ADMISSION DATE:  06/15/2021, CONSULTATION DATE: 06/02/2021 REFERRING MD: Roxan Hockey, CHIEF COMPLAINT: VA ECMO  HPI/course in hospital   60 year old woman with hx of HTN, HLD, hypothyroidism, CVA, DM2 who presented with angina.  Underwent cardiac cath which showed left main dissection, during this procedure became acutely unstable with crushing chest pain and shock.  Taken emergently to OR for bypass after 3-5 impella placed.  Tough to wean from bypass so left centrally cannulated on VA ECMO and sent to [place].  PCCM consulted to help with management.  Past Medical History   Past Medical History:  Diagnosis Date   Arthritis    Brain aneurysm    Diabetes mellitus without complication (HCC)    Headache    Hypertension        SVT (supraventricular tachycardia) ([place])    s/p radiofrequency catheter ablation for AVNRT 02/16/09 (Dr. Cristopher Peru)   Thyroid disease    PROCEDURE FOR THYROID 15 YRS AGO AT [facility]   WPW (Wolff-Parkinson-White syndrome)      Past Surgical History:  Procedure Laterality Date   BREAST SURGERY     BX RT BREAST  BENIGN   CANNULATION FOR ECMO (EXTRACORPOREAL MEMBRANE OXYGENATION) N/A 05/28/2021   Procedure: DECANNULATION FOR ECMO (EXTRACORPOREAL MEMBRANE OXYGENATION);  Surgeon: Melrose Nakayama, MD;  Location: [facility];  Service: Open Heart Surgery;  Laterality: N/A;   CORONARY ARTERY BYPASS GRAFT N/A 05/24/2021   Procedure: CORONARY ARTERY BYPASS GRAFTING (CABG)X2.USING  LEFT INTERNAL MAMMARY ARTERY AND  RIGHT ENDOSCOPIC SAPHENOUS VEIN HARVESTING. ECMO INSERTION;  Surgeon: Melrose Nakayama, MD;  Location: [facility];  Service: Open Heart Surgery;  Laterality: N/A;   CRANIOTOMY N/A 01/04/2016   Procedure: Suboccipital Craniotomy and Cervical one Laminectomy for Clipping of Aneurysm;  Surgeon: Kevan Ny Ditty, MD;  Location: [facility];  Service: Neurosurgery;  Laterality: N/A;    CRANIOTOMY N/A 07/09/2016   Procedure: Craniotomy for clipping of left middle cerebral artery aneurysm;  Surgeon: Kevan Ny Ditty, MD;  Location: [facility];  Service: Neurosurgery;  Laterality: N/A;  Craniotomy for 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 Christella Noa;  Surgeon: Kevan Ny Ditty, MD;  Location: [place];  Service: Neurosurgery;  Laterality: Right;   ELECTROPHYSIOLOGIC STUDY     LAPAROSCOPIC REVISION VENTRICULAR-PERITONEAL (V-P) SHUNT N/A 02/04/2016   Procedure: LAPAROSCOPIC Insertion VENTRICULAR-PERITONEAL (V-P) SHUNT;  Surgeon: Rolm Bookbinder, MD;  Location: [facility];  Service: General;  Laterality: N/A;   LEFT HEART CATH AND CORONARY ANGIOGRAPHY N/A 06/03/2021   Procedure: LEFT HEART CATH AND CORONARY ANGIOGRAPHY;  Surgeon: Jettie Booze, MD;  Location: [facility];  Service: Cardiovascular;  Laterality: N/A;   RADIOLOGY WITH ANESTHESIA N/A 01/03/2016   Procedure: RADIOLOGY WITH ANESTHESIA;  Surgeon: Medication Radiologist, MD;  Location: [facility];  Service: Radiology;  Laterality: N/A;   STERNAL WOUND DEBRIDEMENT N/A 06/14/2021   Procedure: Reexploration and evacuation of hematoma;  Surgeon: Melrose Nakayama, MD;  Location: [facility];  Service: Thoracic;  Laterality: N/A;   STERNAL WOUND DEBRIDEMENT N/A 06/08/2021   Procedure: STERNAL WOUND DEBRIDEMENT;  Surgeon: Melrose Nakayama, MD;  Location: [place];  Service: Thoracic;  Laterality: N/A;   TEE WITHOUT CARDIOVERSION N/A 06/01/2021   Procedure: TRANSESOPHAGEAL ECHOCARDIOGRAM (TEE);  Surgeon: Melrose Nakayama,  MD;  Location: [facility];  Service: Open Heart Surgery;  Laterality: N/A;   VENTRICULAR ASSIST DEVICE INSERTION N/A 05/24/2021   Procedure: VENTRICULAR ASSIST DEVICE INSERTION;  Surgeon: Jettie Booze, MD;  Location: [facility];  Service: Cardiovascular;   Laterality: N/A;   VENTRICULOPERITONEAL SHUNT Right 02/04/2016   Procedure: SHUNT INSERTION VENTRICULAR-PERITONEAL With Laparoscopic Assistance;  Surgeon: Kevan Ny Ditty, MD;  Location: [place] [facility];  Service: Neurosurgery;  Laterality: Right;     Significant Hospital events:  6/14 admitted 6/15 spontaneous artery dissection left main on cath.CP Impella placed.   6/15 emergent coronary artery bypass graft, LIMA to LAD, SVG to OM.  Brief cardiac arrest of less than 10 minutes immediately prior to initiation of cardiopulmonary bypass. 6/15 unable to wean.  Still unable to wean from bypass so placed on centrally cannulated VA ECMO. 6/16 EEG shows encephalopathy.  6/16 Cold right hand - seen by vascular, conservative management 6/17 Hand improved. 725m clot removed with washout at the bedside  6/19 RUL collapse - underwent bronchoscopy and aspiration of RUL mucus plug.  6/20 ECMO Decannulation 6/21>> BIS monitoring started , weaning of sedation, wet CXR 6/22 bedside chest washout; started on aggressive laxatives for severe constipation. 6/23 ongoing hemodynamic instability complicated by ectopy.  Interim history/subjective:   Continues to require high vasopressors.  Started on antibiotics yesterday.  No clear source of infection.  Began to have stools.  Unable to tolerate NG feeds.  Feculent OG aspiration  Objective   Blood pressure 104/71, pulse 100, temperature 99.14 F (37.3 C), resp. rate (!) 23, height _0  (1.6 m), weight 99.3 kg, SpO2 93 %. PAP: (27-53)/(18-37) 34/21 CVP:  [10 mmHg-32 mmHg] 14 mmHg PCWP:  [20 mmHg-21 mmHg] 21 mmHg CO:  [3.7 L/min-4.8 L/min] 4 L/min CI:  [1.9 L/min/m2-2.5 L/min/m2] 2.1 L/min/m2  Vent Mode: PRVC FiO2 (%):  [80 %-100 %] 80 % Set Rate:  [22 bmp] 22 bmp Vt Set:  [410 mL] 410 mL PEEP:  [5 cmH20-10 cmH20] 10 cmH20 Plateau Pressure:  [19 cmH20-26 cmH20] 26 cmH20   Intake/Output Summary (Last 24 hours) at 06/14/2021 1436 Last data filed at  06/14/2021 1200 Gross per 24 hour  Intake 3916.5 ml  Output 4505 ml  Net -588.5 ml    Filed Weights   06/01/2021 0500 06/20/2021 0500 06/14/21 0500  Weight: 96.9 kg 99.9 kg 99.3 kg    Physical Exam Constitutional:      Appearance: She is obese. Open chest HENT:     Head: Normocephalic.  Eyes:     Comments: sclerae intact Neck:     Comments: ET tube and OG tube in place Cardiovascular:     Rate and Rhythm: Intermittent atrial fibrillation    Heart sounds: Heart sounds obscured by chest dressing    Comments: Open chest with chest tubes, [place] tense with serosanguineous fluid.,  All extremities warm.  Marked generalized edema. Pulmonary:     Breath sounds: Rhonchi throughout, diminished per bases    Comments: Mechanically ventilated with acceptable airway pressures.  Abdominal:     Palpations: Abdomen is soft.  Acceptable OG output.  No bowel movement.   Skin:    General: Skin is warm and dry.     Capillary Refill: Capillary refill takes 2 to 3 seconds.  Neurological:     Comments: Sedated.  No movement to voice or spontaneously  Ancillary tests (personally reviewed)  CBC: Recent Labs  Lab 05/27/2021 0330 06/16/2021 0459 06/03/2021 2150 05/31/2021 0357 05/26/2021 0359  06/16/2021 1720 06/14/21 0322 06/14/21 0326  WBC 13.3*  --  14.7*  --  17.2* 19.5*  --  24.2*  HGB 9.2*   < > 8.2* 7.1* 7.7* 7.7* 7.5* 7.6*  HCT 28.1*   < > 24.6* 21.0* 23.0* 23.3* 22.0* 22.3*  MCV 89.5  --  90.1  --  89.8 89.6  --  88.1  PLT 45*  --  47*  --  49* 50*  --  64*   < > = values in this interval not displayed.     Basic Metabolic Panel: Recent Labs  Lab 06/11/21 0331 06/11/21 0542 06/03/2021 0330 06/15/2021 0459 05/22/2021 1550 05/28/2021 2150 06/14/2021 0357 06/03/2021 0359 06/02/2021 1721 06/14/21 0322 06/14/21 0326  NA 136   < > 133*   < > 133* 134* 135 133* 131* 132* 130*  K 4.5   < > 4.3   < > 3.8 4.0 4.1 3.9 4.2 4.3 4.2  CL 108   < > 104  --  103 102  --  105 102  --  100  CO2 21*   < > 20*   --  20* 22  --  21* 20*  --  19*  GLUCOSE 102*   < > 141*  --  135* 146*  --  140* 132*  --  138*  BUN 16   < > 20  --  21* 22*  --  23* 28*  --  29*  CREATININE 1.88*   < > 2.70*  --  2.72* 2.82*  --  2.81* 3.02*  --  3.27*  CALCIUM 8.1*   < > 7.8*  --  7.6* 7.9*  --  7.7* 7.4*  --  7.4*  MG 1.7  --  2.2  --   --   --   --   --   --   --  2.8*  PHOS  --   --   --   --   --   --   --   --   --   --  5.8*   < > = values in this interval not displayed.    GFR: Estimated Creatinine Clearance: 20.6 mL/min (A) (by C-G formula based on SCr of 3.27 mg/dL (H)). Recent Labs  Lab 06/19/2021 0323 06/14/2021 0921 06/06/2021 1649 06/08/2021 2150 06/15/2021 0359 05/28/2021 1720 06/14/21 0326  WBC  --  9.2   < > 14.7* 17.2* 19.5* 24.2*  LATICACIDVEN 0.7 0.9  --   --   --   --   --    < > = values in this interval not displayed.     Liver Function Tests: Recent Labs  Lab 06/03/2021 0322 06/11/21 0331 05/29/2021 0330 05/28/2021 0359 06/14/21 0326  AST 50* 74* 75* 59* 59*  ALT _0 ALKPHOS 45 62 65 67 75  BILITOT 0.6 0.8 1.0 1.3* 1.7*  PROT 4.1* 4.6* 4.8* 4.5* 4.6*  ALBUMIN 2.1* 2.5* 2.4* 2.0* 2.0*    No results for input(s): LIPASE, AMYLASE in the last 168 hours. No results for input(s): AMMONIA in the last 168 hours.  ABG    Component Value Date/Time   PHART 7.332 (L) 06/14/2021 0322   PCO2ART 38.6 06/14/2021 0322   PO2ART 71 (L) 06/14/2021 0322   HCO3 20.4 06/14/2021 0322   TCO2 22 06/14/2021 0322   ACIDBASEDEF 5.0 (H) 06/14/2021 0322   O2SAT 45.7 06/14/2021 0326      Coagulation Profile: Recent Labs  Lab 06/15/2021 0322 06/11/21 0331 05/31/2021 0330 05/26/2021 0359 06/14/21 0326  INR 1.3* 1.2 1.5* 1.6* 1.6*     Cardiac Enzymes: No results for input(s): CKTOTAL, CKMB, CKMBINDEX, TROPONINI in the last 168 hours.  HbA1C: Hgb A1c MFr Bld  Date/Time Value Ref Range Status  05/24/2021 10:03 AM 5.4 4.8 - 5.6 % Final    Comment:    (NOTE)         Prediabetes: 5.7 - 6.4          Diabetes: >6.4         Glycemic control for adults with diabetes: <7.0     CBG: Recent Labs  Lab 05/25/2021 1957 06/20/2021 2333 06/14/21 0321 06/14/21 0825 06/14/21 1159  GLUCAP 133* 132* 144* 183* 218*   CXR: Bilateral effusions.  Left lower lobe segmental collapse, retrocardiac. KUB is nondiagnostic  Assessment & Plan:  Critically ill due to cardiogenic shock secondary to spontaneous coronary dissection of the left main requiring VA ECMO support and titration of vasopressors and inotropes. CP Impella to offload LV. Status post two-vessel bypass Ongoing slow bleeding due to platelet dysfunction.  RUL collapse AKI due to hypotension -has worsened today following diuresis Shock liver due to hypotension - improving Multifactorial thrombocytopenia - improving Status post PEA cardiac arrest Uncontrolled type 2 diabetes Hypo mag  Plan: -Continue current level of Impella support -Wean vasopressors as tolerated to keep MAP greater than 65 -Continue empiric antibiotics.  Follow cultures negative so far. -Hemodynamic management complicated by degree of ventricular hypertrophy making titration of vasopressors and cardiac support difficult. -Continue amiodarone IV. -Slow down bowel regimen -Furosemide infusion to attempt to diurese   Daily Goals Checklist  Pain/Anxiety/Delirium protocol (if indicated): Dilaudid, Precedex and Versed to keep RASS goal -2 VAP protocol (if indicated): Bundle in place Respiratory support goals: Rest ventilator settings.  No weaning at this time. Blood pressure target: MAP greater than 65, normal cardiac output. DVT prophylaxis: Prophylactic heparin.  Nutritional status and feeding goals: . TF on hold GI prophylaxis: Pantoprazole.  Fluid status goals: Started furosemide infusion Urinary catheter: Assessment of intravascular volume Central lines: Right IJ MAC introducer, left radial art line Glucose control:  CBG and  Sombrillo insulin.  Mobility/therapy  needs: Bedrest Antibiotic de-escalation: Cefepime and vancomycin Home medication reconciliation: On hold Daily labs: Per ECMO protocol Code Status: Full code Family Communication: Updated by Dr. Haroldine Laws Disposition: ICU  CRITICAL CARE Performed by: Kipp Brood  Total critical care time: 40mnutes  Critical care time was exclusive of separately billable procedures and treating other patients.  Critical care was necessary to treat or prevent imminent or life-threatening deterioration.  Critical care was time spent personally by me on the following activities: development of treatment plan with patient and/or surrogate as well as nursing, discussions with consultants, evaluation of patient's response to treatment, examination of patient, obtaining history from patient or surrogate, ordering and performing treatments and interventions, ordering and review of laboratory studies, ordering and review of radiographic studies, pulse oximetry, re-evaluation of patient's condition and participation in multidisciplinary rounds.  RKipp Brood MD F[facility] CareICU Physician CFort Lee Pager: [phone] Epic Secure Chat After hours: [phone].  06/14/2021, 2:36 PM

## 2021-06-14 NOTE — Progress Notes (Signed)
Patient ID: [name] [name], female   DOB: 1961/11/20, 60 y.o.   MRN: [id number] TCTS Evening Rounds:  Hemodynamics relatively stable on multiple vasopressors.  NE 34 Vaso 0.04 Epi 5  Impella at P7  Remains in atrial fib with some RVR on IV amio.  Vent dependent with open sternum. Chest tube output low.  BMET    Component Value Date/Time   NA 131 (L) 06/14/2021 1648   K 4.2 06/14/2021 1648   CL 99 06/14/2021 1648   CO2 21 (L) 06/14/2021 1648   GLUCOSE 142 (H) 06/14/2021 1648   BUN 31 (H) 06/14/2021 1648   CREATININE 3.70 (H) 06/14/2021 1648   CALCIUM 7.6 (L) 06/14/2021 1648   GFRNONAA 13 (L) 06/14/2021 1648   GFRAA 34 (L) 05/24/2020 1503

## 2021-06-14 NOTE — Progress Notes (Signed)
CSW attempted to visit the patient at bedside to introduce self as the heart failure social worker and to complete a very brief SDOH screening with the patient to address social needs as needed however the patient is sedated on vent and unable to engage in conversation at this time.   CSW will continue to follow for discharge needs and will check back with the patient at another time.  Charnelle Bergeman, MSW, [place] Heart Failure Social Worker

## 2021-06-14 NOTE — Progress Notes (Signed)
PHARMACY - TOTAL PARENTERAL NUTRITION CONSULT NOTE   Indication:  intolerance to enteral feeding  Patient Measurements: Height: '5\' 3"'  (160 cm) Weight: 99.3 kg (218 lb 14.7 oz) IBW/kg (Calculated) : 52.4 TPN AdjBW (KG): 61.1 Body mass index is 38.78 kg/m. Usual Weight:   Assessment: 60 yo female admitted w/ chest pain/STEMI requiring emergent CABG. Now w/ cardiogenic shock s/p ECMO (decannulated 6/20), requiring Impella and vent support.  PMH: DM, HTN, HLD, CVA  Patient was started on tube feeds 6/16, however patient has not been tolerating well and tube feeds had to be stopped on 6/23.  Cortrak was clamped on 6/23 due to dark brown emesis.    Concentrating TPN as much as possible to limit fluid intake given heart failure and worsened renal function.    Glucose / Insulin: hx DM, CBGs <180, on hydrocortisone 50 mg IV q6h, on Novolog q4h - used 10 units over past 24 hours Electrolytes: Na 130, K 4.2 (on Lasix gtt), Mg 2.8, Cl/Ac 100/19, Phos 5.8 Renal: AKI 2/2 shock/ATN - Scr 3.27, BUN 29 Hepatic: AST slight elev, ALT wnl, alk phos wnl, Tbili 1.7 Intake / Output; MIVF: UOP 2015 mL, on Lasix gtt, KVO fluids, +9 L since admit GI Imaging: 6/24 XR abdomen: no acute abnormality GI Surgeries / Procedures:  N/a   Central access: placing consult for access 6/24 TPN start date: 6/24  Nutritional Goals (per RD recommendation on 6/23): kCal: 2100-2300, Protein: 125-140g, Fluid: >1.9 L Goal TPN rate is 75 mL/hr (provides 140 g of protein and 2200 kcals per day)  Current Nutrition:  NPO and TPN- to start 6/24  Plan:  Start TPN at 78m/hr at 1800 Concentrate TPN to limit fluids (using Clinisol 15%)  Electrolytes in TPN: Na 519m/L, K 25m61mL, Ca 5mE28m, Mg 25mEq35m and Phos 25mmol5m Max acetate -No K in TPN for now given renal failure and K trend in the 4's - supplement outside as needed Add standard MVI and trace elements to TPN Continue phase 3 glycemic control SSI - may need to adjust  6/25  MIVF per MD - only KVO running currently F/u CMET, Mg, Phos 6/25 Monitor TPN labs on Mon/Thurs  Mary-ADimple NanasmD PGY-1 Acute Care Pharmacy Resident 06/14/2021,10:24 AM

## 2021-06-14 NOTE — Progress Notes (Signed)
eLink Physician-Brief Progress Note Patient Name: [name] [name] DOB: 1961/07/22 MRN: [id number]   Date of Service  06/14/2021  HPI/Events of Note  ABG on 100%/PRVC 22/TV 410/P 5 = 7.33/38.6/71/20.4. Bibasilar atelectasis, as well as interstitial prominence suggesting pulmonary edema noted on prior CXR. BMI = 39.01.  eICU Interventions  Plan: Increase PEEP to 10. Wean FiO2 as tolerated.      Intervention Category Major Interventions: Respiratory failure - evaluation and management  Madisan Bice Cornelia Copa 06/14/2021, 3:53 AM

## 2021-06-14 NOTE — Progress Notes (Signed)
71ml of Dilaudid drip wasted in stericycle, witnessed by [employer] RN

## 2021-06-14 NOTE — Progress Notes (Signed)
Cortrak Tube Team Note:  Consult received to adjust pt's Cortrak feeding tube.   X-ray is required, abdominal x-ray has been ordered by the Cortrak team. Please confirm tube placement before using the Cortrak tube.   If the tube becomes dislodged please keep the tube and contact the Cortrak team at [email or web address] ([email or web address]) for replacement.  If after hours and replacement cannot be delayed, place a NG tube and confirm placement with an abdominal x-ray.    Lockie Pares., RD, LDN, CNSC See AMiON for contact information

## 2021-06-14 NOTE — Progress Notes (Signed)
Advanced Heart Failure Rounding Note   Subjective:    6/15 - LM coronary scad with emergent CABG  -> ECMO 6/17 - chest washout at bedside 6/19 - bronch due to RUL mucous plugging  6/20 - ECMO decannulation. Intra-op TEE EF ~35% 6/21 - Repeat washout 6/21 - DCCV   Decannulated from ECMO 6/20 Chest remains open. Sedated on vent.   Remains on Impella at P-6 Flow 2.6  MAPs 65-70  Now on epi 5 NE  40 -> 46 -> 43, milrinone 0.25. VP 0.04  On lasix gtt 8.  Had 2L out. Cr up 3.0 -> 3.3 Weight down 1 pound   CVP 14 Thermo 4.1/2.1 Co-ox 46%  Objective:   Weight Range:  Vital Signs:   Temp:  [96.98 F (36.1 C)-99.32 F (37.4 C)] 99.14 F (37.3 C) (06/24 0700) Pulse Rate:  [55-134] 97 (06/24 0700) Resp:  [18-35] 23 (06/24 0700) BP: (82-104)/(52-79) 104/71 (06/24 0700) SpO2:  [78 %-100 %] 100 % (06/24 0700) Arterial Line BP: (65-130)/(40-79) 96/62 (06/24 0700) FiO2 (%):  [100 %] 100 % (06/24 0411) Weight:  [99.3 kg] 99.3 kg (06/24 0500) Last BM Date: 06/09/2021  Weight change: Filed Weights   06/09/2021 0500 06/16/2021 0500 06/14/21 0500  Weight: 96.9 kg 99.9 kg 99.3 kg    Intake/Output:   Intake/Output Summary (Last 24 hours) at 06/14/2021 0726 Last data filed at 06/14/2021 0700 Gross per 24 hour  Intake 3896.68 ml  Output 4725 ml  Net -828.32 ml      Physical Exam: General: Sedated on vent. Chest open  HEENT: normal + ETT Neck: supple. RIJ swan  Cor: Chest open. Covered with esmark Regular rate & rhythm. No rubs, gallops or murmurs. Lungs: coarse Abdomen: obese soft, nontender, nondistended. No hepatosplenomegaly. No bruits or masses. Good bowel sounds. Extremities: no cyanosis, clubbing, rash, 3+ edema Neuro: intubated/sedated   Telemetry: NSR 90-110 Personally reviewed   Labs: Basic Metabolic Panel: Recent Labs  Lab 06/11/21 0331 06/11/21 0542 06/09/2021 0330 06/06/2021 0459 06/03/2021 1550 06/11/2021 2150 06/18/2021 0357 06/15/2021 0359 06/01/2021 1721  06/14/21 0322 06/14/21 0326  NA 136   < > 133*   < > 133* 134* 135 133* 131* 132* 130*  K 4.5   < > 4.3   < > 3.8 4.0 4.1 3.9 4.2 4.3 4.2  CL 108   < > 104  --  103 102  --  105 102  --  100  CO2 21*   < > 20*  --  20* 22  --  21* 20*  --  19*  GLUCOSE 102*   < > 141*  --  135* 146*  --  140* 132*  --  138*  BUN 16   < > 20  --  21* 22*  --  23* 28*  --  29*  CREATININE 1.88*   < > 2.70*  --  2.72* 2.82*  --  2.81* 3.02*  --  3.27*  CALCIUM 8.1*   < > 7.8*  --  7.6* 7.9*  --  7.7* 7.4*  --  7.4*  MG 1.7  --  2.2  --   --   --   --   --   --   --  2.8*   < > = values in this interval not displayed.     Liver Function Tests: Recent Labs  Lab 05/23/2021 0322 06/11/21 0331 06/14/2021 0330 05/22/2021 0359 06/14/21 0326  AST 50* 74* 75* 59* 59*  ALT _0 ALKPHOS 45 62 65 67 75  BILITOT 0.6 0.8 1.0 1.3* 1.7*  PROT 4.1* 4.6* 4.8* 4.5* 4.6*  ALBUMIN 2.1* 2.5* 2.4* 2.0* 2.0*    No results for input(s): LIPASE, AMYLASE in the last 168 hours. No results for input(s): AMMONIA in the last 168 hours.  CBC: Recent Labs  Lab 06/11/2021 0330 05/28/2021 0459 06/14/2021 2150 05/24/2021 0357 06/11/2021 0359 05/23/2021 1720 06/14/21 0322 06/14/21 0326  WBC 13.3*  --  14.7*  --  17.2* 19.5*  --  24.2*  HGB 9.2*   < > 8.2* 7.1* 7.7* 7.7* 7.5* 7.6*  HCT 28.1*   < > 24.6* 21.0* 23.0* 23.3* 22.0* 22.3*  MCV 89.5  --  90.1  --  89.8 89.6  --  88.1  PLT 45*  --  47*  --  49* 50*  --  64*   < > = values in this interval not displayed.     Cardiac Enzymes: No results for input(s): CKTOTAL, CKMB, CKMBINDEX, TROPONINI in the last 168 hours.  BNP: BNP (last 3 results) No results for input(s): BNP in the last 8760 hours.  ProBNP (last 3 results) No results for input(s): PROBNP in the last 8760 hours.    Other results:  Imaging: DG CHEST PORT 1 VIEW  Result Date: 06/16/2021 CLINICAL DATA:  Intubation.  Chest tube. EXAM: PORTABLE CHEST 1 VIEW COMPARISON:  06/19/2021. FINDINGS: Endotracheal  tube, feeding tube, Swan-Ganz catheter, mediastinal drainage catheters, left chest tube, shunt tubing in stable position. Prior CABG. Cardiomegaly with bilateral interstitial prominence consistent interstitial edema. Interim slight improvement interstitial edema from prior exam. Bibasilar atelectasis again noted. Small bilateral pleural effusions again noted. No pneumothorax. IMPRESSION: 1. Lines and tubes including in polyp device and left chest tube in stable position. No pneumothorax. 2. Prior CABG. Cardiomegaly with bilateral interstitial prominence suggesting interstitial edema. Interim slight improvement of interstitial edema from prior exam. 3. Bibasilar atelectasis again noted. Small bilateral pleural effusions again noted. Electronically Signed   By: Marcello Moores  Register   On: 05/23/2021 07:38   DG Abd Portable 1V  Result Date: 06/02/2021 CLINICAL DATA:  Question feeding tube position. EXAM: PORTABLE ABDOMEN - 1 VIEW COMPARISON:  Abdomen 06/18/2021. FINDINGS: Feeding tube noted in stable position with tip over the distal stomach/proximal duodenum. Right shunt tubing noted. No bowel distention. Mild lumbar spine scoliosis and degenerative change. Mild degenerative changes both hips. IMPRESSION: Feeding tube noted with tip over the distal stomach/proximal duodenum. No bowel distention. Electronically Signed   By: Marcello Moores  Register   On: 06/09/2021 07:24   DG Abd Portable 1V  Result Date: 05/23/2021 CLINICAL DATA:  Feeding tube placement EXAM: PORTABLE ABDOMEN - 1 VIEW COMPARISON:  Prior study same day. FINDINGS: Feeding tube noted with tip over the distal stomach. VP shunt tubing noted. Tubing noted over the abdomen. Leads noted over the abdomen. Paucity of bowel gas again noted. IMPRESSION: Feeding tube noted with tip over the stomach. Electronically Signed   By: Marcello Moores  Register   On: 06/11/2021 12:29   DG Abd Portable 1V  Result Date: 05/25/2021 CLINICAL DATA:  Abdominal distension, rule out  obstruction EXAM: PORTABLE ABDOMEN - 1 VIEW COMPARISON:  None. FINDINGS: General paucity of bowel gas, without evidence of obstruction or obvious free air. Esophagogastric tube with tip and side port below the diaphragm. Additional overlying support apparatus includes multiple chest tubes. Shunt catheter, with tip position in the right hemiabdomen. IMPRESSION: 1. General paucity of bowel gas,  without evidence of obstruction or obvious free air. 2. Esophagogastric tube with tip and side port below the diaphragm. Additional overlying support apparatus includes multiple chest tubes. Electronically Signed   By: Eddie Candle M.D.   On: 06/11/2021 10:10     Medications:     Scheduled Medications:  aspirin  324 mg Per Tube Daily   chlorhexidine gluconate (MEDLINE KIT)  15 mL Mouth Rinse BID   Chlorhexidine Gluconate Cloth  6 each Topical Daily   colchicine  0.3 mg Per Tube Daily   docusate  200 mg Per Tube Daily   feeding supplement (PROSource TF)  90 mL Per Tube QID   hydrocortisone sod succinate (SOLU-CORTEF) inj  50 mg Intravenous Q6H   insulin aspart  1-3 Units Subcutaneous Q4H   mouth rinse  15 mL Mouth Rinse 10 times per day   pantoprazole sodium  40 mg Per Tube Daily   polyethylene glycol  17 g Oral BID   rosuvastatin  40 mg Per Tube q1800   sennosides  10 mL Per Tube Daily   sodium chloride flush  10-40 mL Intracatheter Q12H   sodium chloride flush  3 mL Intravenous Q12H    Infusions:  sodium chloride Stopped (06/01/2021 0918)   sodium chloride     sodium chloride     amiodarone 60 mg/hr (06/14/21 0700)   ceFEPime (MAXIPIME) IV Stopped (06/14/2021 1731)   dexmedetomidine (PRECEDEX) IV infusion 0.4 mcg/kg/hr (06/14/21 0700)   epinephrine 5 mcg/min (06/14/21 0700)   feeding supplement (PIVOT 1.5 CAL) Stopped (06/16/2021 1900)   furosemide (LASIX) 200 mg in dextrose 5% 100 mL (25m/mL) infusion 8 mg/hr (06/14/21 0700)   heparin 25 units/mL (Impella PURGE) in dextrose 5 % 1000 mL bag      HYDROmorphone 6 mg/hr (06/14/21 0700)   lactated ringers Stopped (06/06/2021 1900)   lactated ringers 10 mL/hr at 06/11/2021 0007   midazolam 6 mg/hr (06/14/21 0700)   milrinone 0.25 mcg/kg/min (06/14/21 0700)   norepinephrine (LEVOPHED) Adult infusion 43 mcg/min (06/14/21 0700)   vancomycin Stopped (05/28/2021 2104)   vasopressin 0.04 Units/min (06/14/21 0700)    PRN Medications: sodium chloride, sodium chloride, sodium chloride, acetaminophen (TYLENOL) oral liquid 160 mg/5 mL, bisacodyl, HYDROmorphone, ipratropium-albuterol, metoprolol tartrate, midazolam, ondansetron (ZOFRAN) IV, polyvinyl alcohol, sodium chloride flush, sodium chloride flush   Assessment/Plan   Cardiogenic shock in setting of left main dissection and emergent CABG - chest open on V-A ECMO with Impella vent. S/p chest washout 6/17 - TEE 9/19 EF 35-40% - ECMO decannulated 6/20  - Impella at P-6  Flow 2.8 Waveforms ok  - On EPI 5 NE 40 milrinone 0.375  - She remains critically ill with MSOF. Barely making index. Discussed possible upgrade to Impella 5.5 with TCTS and family. Family is currently discussing. I am not sure this will make a profound difference in her outcome but other options are limited.  - Continues to require high-dose pressors. Needs fluid off to permit chest closure. Will increase lasix gtt to 12 - D/w family do not want to pursue upgrade to Impella 5.5  2. CAD with acute due to left main dissection - s/p emergent CABG 06/14/2021 - continue ASA/statin  3. Acute respiratory failure due to above - On vent. Attempting to diurese as tolerated - CCM managing vent - No change today  4 AKI - due to shock/ATN - SCr baseline 1.7 -> 2.7 -> 2.8 -. 3.2 - continue support. Keep MAPs > 70 - may need to consider CVVHD  soon   5. Hypomag/hypokalemia - supp as needed  6. Anemia due to expected post-operative blood loss and ECMO circuit - transfuse to keep hgb >= 8.0.  7. Thrombocytopenia - due to critical  illness. PLTs 45k -> 32k -> 64k - has h/o Von Willebrands deficiency - has received low-dose DDAVP - likely due to critical illness/Impella - HIT negative  8. Sepsis - developed high fevers on 6/21. Vanc/cefepime added - WBC 19.5 -> 24.2k - Cx remain NGTD - continue abx  9. Atrial fibrillation, paroxysmal - back in NSR after DCCV 6/21 - continue amio   10. Post-operative ileus - had BM 6/23. Flexi-seal in place - getting trickle feeds va Cor-trak    CRITICAL CARE Performed by: Glori Bickers  Total critical care time: 45 minutes  Critical care time was exclusive of separately billable procedures and treating other patients.  Critical care was necessary to treat or prevent imminent or life-threatening deterioration.  Critical care was time spent personally by me (independent of midlevel providers or residents) on the following activities: development of treatment plan with patient and/or surrogate as well as nursing, discussions with consultants, evaluation of patient's response to treatment, examination of patient, obtaining history from patient or surrogate, ordering and performing treatments and interventions, ordering and review of laboratory studies, ordering and review of radiographic studies, pulse oximetry and re-evaluation of patient's condition.    Length of Stay: 9   Glori Bickers MD 06/14/2021, 7:26 AM  Advanced Heart Failure Team Pager [phone] (M-F; [place])  Please contact [facility] for night-coverage after hours (4p -7a ) and weekends on [email or web address]

## 2021-06-15 ENCOUNTER — Inpatient Hospital Stay (HOSPITAL_COMMUNITY): Payer: 59

## 2021-06-15 LAB — TYPE AND SCREEN
ABO/RH(D): O POS
Antibody Screen: NEGATIVE
Unit division: 0
Unit division: 0
Unit division: 0
Unit division: 0
Unit division: 0

## 2021-06-15 LAB — CBC
HCT: 20.3 % — ABNORMAL LOW (ref 36.0–46.0)
Hemoglobin: 7 g/dL — ABNORMAL LOW (ref 12.0–15.0)
MCH: 29.9 pg (ref 26.0–34.0)
MCHC: 34.5 g/dL (ref 30.0–36.0)
MCV: 86.8 fL (ref 80.0–100.0)
Platelets: 90 10*3/uL — ABNORMAL LOW (ref 150–400)
RBC: 2.34 MIL/uL — ABNORMAL LOW (ref 3.87–5.11)
RDW: 17.2 % — ABNORMAL HIGH (ref 11.5–15.5)
WBC: 29.5 10*3/uL — ABNORMAL HIGH (ref 4.0–10.5)
nRBC: 5.4 % — ABNORMAL HIGH (ref 0.0–0.2)

## 2021-06-15 LAB — BPAM RBC
Blood Product Expiration Date: 202207222359
Blood Product Expiration Date: 202207222359
Blood Product Expiration Date: 202207222359
Blood Product Expiration Date: 202207222359
Blood Product Expiration Date: 202207222359
ISSUE DATE / TIME: 202206200033
ISSUE DATE / TIME: 202206200046
ISSUE DATE / TIME: 202206221630
ISSUE DATE / TIME: 202206230517
Unit Type and Rh: 5100
Unit Type and Rh: 5100
Unit Type and Rh: 5100
Unit Type and Rh: 5100
Unit Type and Rh: 5100

## 2021-06-15 LAB — COMPREHENSIVE METABOLIC PANEL
ALT: 7 U/L (ref 0–44)
AST: 72 U/L — ABNORMAL HIGH (ref 15–41)
Albumin: 1.9 g/dL — ABNORMAL LOW (ref 3.5–5.0)
Alkaline Phosphatase: 76 U/L (ref 38–126)
Anion gap: 12 (ref 5–15)
BUN: 38 mg/dL — ABNORMAL HIGH (ref 6–20)
CO2: 20 mmol/L — ABNORMAL LOW (ref 22–32)
Calcium: 7.7 mg/dL — ABNORMAL LOW (ref 8.9–10.3)
Chloride: 98 mmol/L (ref 98–111)
Creatinine, Ser: 4.07 mg/dL — ABNORMAL HIGH (ref 0.44–1.00)
GFR, Estimated: 12 mL/min — ABNORMAL LOW (ref >60)
Glucose, Bld: 157 mg/dL — ABNORMAL HIGH (ref 70–99)
Potassium: 3.8 mmol/L (ref 3.5–5.1)
Sodium: 130 mmol/L — ABNORMAL LOW (ref 135–145)
Total Bilirubin: 2.1 mg/dL — ABNORMAL HIGH (ref 0.3–1.2)
Total Protein: 5 g/dL — ABNORMAL LOW (ref 6.5–8.1)

## 2021-06-15 LAB — POCT I-STAT 7, (LYTES, BLD GAS, ICA,H+H)
Acid-base deficit: 5 mmol/L — ABNORMAL HIGH (ref 0.0–2.0)
Acid-base deficit: 2 mmol/L (ref 0.0–2.0)
Acid-base deficit: 4 mmol/L — ABNORMAL HIGH (ref 0.0–2.0)
Bicarbonate: 20.8 mmol/L (ref 20.0–28.0)
Bicarbonate: 26 mmol/L (ref 20.0–28.0)
Bicarbonate: 22.6 mmol/L (ref 20.0–28.0)
Calcium, Ion: 1.14 mmol/L — ABNORMAL LOW (ref 1.15–1.40)
Calcium, Ion: 1.12 mmol/L — ABNORMAL LOW (ref 1.15–1.40)
Calcium, Ion: 1.11 mmol/L — ABNORMAL LOW (ref 1.15–1.40)
HCT: 21 % — ABNORMAL LOW (ref 36.0–46.0)
HCT: 26 % — ABNORMAL LOW (ref 36.0–46.0)
HCT: 26 % — ABNORMAL LOW (ref 36.0–46.0)
Hemoglobin: 7.1 g/dL — ABNORMAL LOW (ref 12.0–15.0)
Hemoglobin: 8.8 g/dL — ABNORMAL LOW (ref 12.0–15.0)
Hemoglobin: 8.8 g/dL — ABNORMAL LOW (ref 12.0–15.0)
O2 Saturation: 86 %
O2 Saturation: 100 %
O2 Saturation: 99 %
Patient temperature: 38
Patient temperature: 38.1
Patient temperature: 38
Potassium: 3.9 mmol/L (ref 3.5–5.1)
Potassium: 4.5 mmol/L (ref 3.5–5.1)
Potassium: 4.3 mmol/L (ref 3.5–5.1)
Sodium: 130 mmol/L — ABNORMAL LOW (ref 135–145)
Sodium: 129 mmol/L — ABNORMAL LOW (ref 135–145)
Sodium: 129 mmol/L — ABNORMAL LOW (ref 135–145)
TCO2: 22 mmol/L (ref 22–32)
TCO2: 28 mmol/L (ref 22–32)
TCO2: 24 mmol/L (ref 22–32)
pCO2 arterial: 43.2 mmHg (ref 32.0–48.0)
pCO2 arterial: 68.5 mmHg (ref 32.0–48.0)
pCO2 arterial: 52.9 mmHg — ABNORMAL HIGH (ref 32.0–48.0)
pH, Arterial: 7.295 — ABNORMAL LOW (ref 7.350–7.450)
pH, Arterial: 7.194 — CL (ref 7.350–7.450)
pH, Arterial: 7.244 — ABNORMAL LOW (ref 7.350–7.450)
pO2, Arterial: 60 mmHg — ABNORMAL LOW (ref 83.0–108.0)
pO2, Arterial: 327 mmHg — ABNORMAL HIGH (ref 83.0–108.0)
pO2, Arterial: 161 mmHg — ABNORMAL HIGH (ref 83.0–108.0)

## 2021-06-15 LAB — COOXEMETRY PANEL
Carboxyhemoglobin: 0.9 % (ref 0.5–1.5)
Carboxyhemoglobin: 0.7 % (ref 0.5–1.5)
Methemoglobin: 1 % (ref 0.0–1.5)
Methemoglobin: 1 % (ref 0.0–1.5)
O2 Saturation: 49 %
O2 Saturation: 67 %
Total hemoglobin: 8.4 g/dL — ABNORMAL LOW (ref 12.0–16.0)
Total hemoglobin: 11.7 g/dL — ABNORMAL LOW (ref 12.0–16.0)

## 2021-06-15 LAB — GLUCOSE, CAPILLARY
Glucose-Capillary: 140 mg/dL — ABNORMAL HIGH (ref 70–99)
Glucose-Capillary: 145 mg/dL — ABNORMAL HIGH (ref 70–99)
Glucose-Capillary: 158 mg/dL — ABNORMAL HIGH (ref 70–99)
Glucose-Capillary: 162 mg/dL — ABNORMAL HIGH (ref 70–99)
Glucose-Capillary: 186 mg/dL — ABNORMAL HIGH (ref 70–99)
Glucose-Capillary: 217 mg/dL — ABNORMAL HIGH (ref 70–99)

## 2021-06-15 LAB — BASIC METABOLIC PANEL
Anion gap: 13 (ref 5–15)
BUN: 46 mg/dL — ABNORMAL HIGH (ref 6–20)
CO2: 21 mmol/L — ABNORMAL LOW (ref 22–32)
Calcium: 7.8 mg/dL — ABNORMAL LOW (ref 8.9–10.3)
Chloride: 98 mmol/L (ref 98–111)
Creatinine, Ser: 4.52 mg/dL — ABNORMAL HIGH (ref 0.44–1.00)
GFR, Estimated: 11 mL/min — ABNORMAL LOW (ref >60)
Glucose, Bld: 138 mg/dL — ABNORMAL HIGH (ref 70–99)
Potassium: 4.2 mmol/L (ref 3.5–5.1)
Sodium: 132 mmol/L — ABNORMAL LOW (ref 135–145)

## 2021-06-15 LAB — DIFFERENTIAL
Abs Immature Granulocytes: 2.7 10*3/uL — ABNORMAL HIGH (ref 0.00–0.07)
Basophils Absolute: 0.1 10*3/uL (ref 0.0–0.1)
Basophils Relative: 0 %
Eosinophils Absolute: 0 10*3/uL (ref 0.0–0.5)
Eosinophils Relative: 0 %
Immature Granulocytes: 9 %
Lymphocytes Relative: 4 %
Lymphs Abs: 1.2 10*3/uL (ref 0.7–4.0)
Monocytes Absolute: 1.4 10*3/uL — ABNORMAL HIGH (ref 0.1–1.0)
Monocytes Relative: 5 %
Neutro Abs: 23.8 10*3/uL — ABNORMAL HIGH (ref 1.7–7.7)
Neutrophils Relative %: 82 %

## 2021-06-15 LAB — PREPARE RBC (CROSSMATCH)

## 2021-06-15 LAB — APTT
aPTT: 48 seconds — ABNORMAL HIGH (ref 24–36)
aPTT: 50 seconds — ABNORMAL HIGH (ref 24–36)

## 2021-06-15 LAB — HEPARIN LEVEL (UNFRACTIONATED)
Heparin Unfractionated: <0.1 IU/mL — ABNORMAL LOW (ref 0.30–0.70)
Heparin Unfractionated: <0.1 IU/mL — ABNORMAL LOW (ref 0.30–0.70)

## 2021-06-15 LAB — LACTATE DEHYDROGENASE: LDH: 1242 U/L — ABNORMAL HIGH (ref 98–192)

## 2021-06-15 LAB — FIBRINOGEN: Fibrinogen: 788 mg/dL — ABNORMAL HIGH (ref 210–475)

## 2021-06-15 LAB — PROTIME-INR
INR: 1.4 — ABNORMAL HIGH (ref 0.8–1.2)
Prothrombin Time: 17.5 seconds — ABNORMAL HIGH (ref 11.4–15.2)

## 2021-06-15 LAB — TRIGLYCERIDES: Triglycerides: 65 mg/dL (ref <150)

## 2021-06-15 LAB — PHOSPHORUS: Phosphorus: 5.4 mg/dL — ABNORMAL HIGH (ref 2.5–4.6)

## 2021-06-15 LAB — MAGNESIUM: Magnesium: 2.7 mg/dL — ABNORMAL HIGH (ref 1.7–2.4)

## 2021-06-15 LAB — PREALBUMIN: Prealbumin: 5.5 mg/dL — ABNORMAL LOW (ref 18–38)

## 2021-06-15 MED ORDER — SODIUM CHLORIDE 0.9 % IV SOLN: 1.0000 mg/h | INTRAVENOUS | Status: DC

## 2021-06-15 MED ORDER — ARTIFICIAL TEARS OPHTHALMIC OINT
1.0000 "application " | TOPICAL_OINTMENT | Freq: Three times a day (TID) | OPHTHALMIC | Status: DC
  Administered 2021-06-15 – 2021-06-17 (×6): 1 via OPHTHALMIC
  Filled 2021-06-15: qty 3.5

## 2021-06-15 MED ORDER — STERILE WATER FOR INJECTION IJ SOLN
INTRAMUSCULAR | Status: AC
  Filled 2021-06-15: qty 10

## 2021-06-15 MED ORDER — HYDROMORPHONE BOLUS VIA INFUSION
0.5000 mg | INTRAVENOUS | Status: DC | PRN
  Administered 2021-06-15 – 2021-06-16 (×4): 0.5 mg via INTRAVENOUS
  Filled 2021-06-15: qty 1

## 2021-06-15 MED ORDER — SODIUM CHLORIDE 0.9% IV SOLUTION: Freq: Once | INTRAVENOUS | Status: DC

## 2021-06-15 MED ORDER — MIDAZOLAM 50MG/50ML (1MG/ML) PREMIX INFUSION
2.0000 mg/h | INTRAVENOUS | Status: DC
  Administered 2021-06-15 (×2): 10 mg/h via INTRAVENOUS
  Administered 2021-06-15: 5 mg/h via INTRAVENOUS
  Administered 2021-06-16 (×4): 10 mg/h via INTRAVENOUS
  Administered 2021-06-17: 5 mg/h via INTRAVENOUS
  Filled 2021-06-15 (×8): qty 50

## 2021-06-15 MED ORDER — HEPARIN SODIUM (PORCINE) 5000 UNIT/ML IJ SOLN
INTRAVENOUS | Status: DC
  Filled 2021-06-15: qty 10

## 2021-06-15 MED ORDER — METOLAZONE 5 MG PO TABS
5.0000 mg | ORAL_TABLET | Freq: Once | ORAL | Status: AC
  Administered 2021-06-15: 5 mg
  Filled 2021-06-15: qty 1

## 2021-06-15 MED ORDER — PIVOT 1.5 CAL PO LIQD
1000.0000 mL | ORAL | Status: DC
  Administered 2021-06-15: 1000 mL

## 2021-06-15 MED ORDER — ROCURONIUM BROMIDE 50 MG/5ML IV SOLN
100.0000 mg | Freq: Once | INTRAVENOUS | Status: AC
  Administered 2021-06-15: 100 mg via INTRAVENOUS
  Filled 2021-06-15: qty 10

## 2021-06-15 MED ORDER — TRACE MINERALS CU-MN-SE-ZN 300-55-60-3000 MCG/ML IV SOLN
INTRAVENOUS | Status: AC
  Filled 2021-06-15: qty 677.6

## 2021-06-15 MED ORDER — HYDROMORPHONE HCL 1 MG/ML IJ SOLN
1.0000 mg | Freq: Once | INTRAMUSCULAR | Status: AC
  Administered 2021-06-15: 1 mg via INTRAVENOUS

## 2021-06-15 MED ORDER — ALBUMIN HUMAN 25 % IV SOLN
12.5000 g | INTRAVENOUS | Status: AC
  Administered 2021-06-15 (×4): 12.5 g via INTRAVENOUS
  Filled 2021-06-15 (×4): qty 50

## 2021-06-15 MED ORDER — SODIUM CHLORIDE 0.9 % IV SOLN
0.5000 mg/h | INTRAVENOUS | Status: DC
  Administered 2021-06-15: 4 mg/h via INTRAVENOUS
  Administered 2021-06-16: 7 mg/h via INTRAVENOUS
  Filled 2021-06-15: qty 10
  Filled 2021-06-15 (×2): qty 20

## 2021-06-15 MED ORDER — STERILE WATER FOR INJECTION IJ SOLN
INTRAMUSCULAR | Status: AC
  Administered 2021-06-15: 10 mL
  Filled 2021-06-15: qty 10

## 2021-06-15 MED ORDER — VECURONIUM BROMIDE 10 MG IV SOLR
10.0000 mg | INTRAVENOUS | Status: DC | PRN
  Administered 2021-06-15 – 2021-06-16 (×6): 10 mg via INTRAVENOUS
  Filled 2021-06-15 (×6): qty 10

## 2021-06-15 MED ORDER — MIDAZOLAM BOLUS VIA INFUSION
1.0000 mg | INTRAVENOUS | Status: DC | PRN
  Administered 2021-06-15 – 2021-06-16 (×4): 2 mg via INTRAVENOUS
  Administered 2021-06-16: 1 mg via INTRAVENOUS
  Filled 2021-06-15: qty 2

## 2021-06-15 MED ORDER — ROCURONIUM BROMIDE 10 MG/ML (PF) SYRINGE
PREFILLED_SYRINGE | INTRAVENOUS | Status: AC
  Filled 2021-06-15: qty 10

## 2021-06-15 MED ORDER — SODIUM CHLORIDE 0.9 % IV SOLN
INTRAVENOUS | Status: DC | PRN
  Administered 2021-06-15: 250 mL via INTRAVENOUS

## 2021-06-15 NOTE — Procedures (Signed)
Central Venous Catheter Insertion Procedure Note  [name] [name]  [id number]  [birth year]  Date:06/15/21  Time:10:36 AM   Provider Performing:Nichalos Brenton Loletha Grayer Tamala Julian   Procedure: Insertion of Non-tunneled Central Venous [phone]) with US guidance ([id number])   Indication(s) Medication administration  Consent Risks of the procedure as well as the alternatives and risks of each were explained to the patient and/or caregiver.  Consent for the procedure was obtained and is signed in the bedside chart  Anesthesia Topical only with 1% lidocaine   Timeout Verified patient identification, verified procedure, site/side was marked, verified correct patient position, special equipment/implants available, medications/allergies/relevant history reviewed, required imaging and test results available.  Sterile Technique Maximal sterile technique including full sterile barrier drape, hand hygiene, sterile gown, sterile gloves, mask, hair covering, sterile ultrasound probe cover (if used).  Procedure Description Area of catheter insertion was cleaned with chlorhexidine and draped in sterile fashion.  With real-time ultrasound guidance a central venous catheter was placed into the left internal jugular vein. Nonpulsatile blood flow and easy flushing noted in all ports.  The catheter was sutured in place and sterile dressing applied.  Complications/Tolerance None; patient tolerated the procedure well. Chest X-ray is ordered to verify placement for internal jugular or subclavian cannulation.   Chest x-ray is not ordered for femoral cannulation.  EBL Minimal  Specimen(s) None

## 2021-06-15 NOTE — Progress Notes (Signed)
PHARMACY - TOTAL PARENTERAL NUTRITION CONSULT NOTE   Indication:  intolerance to enteral feeding  Patient Measurements: Height: '5\' 3"'  (160 cm) Weight: 102.7 kg (226 lb 6.6 oz) IBW/kg (Calculated) : 52.4 TPN AdjBW (KG): 61.1 Body mass index is 40.11 kg/m. Usual Weight:   Assessment: 60 yo female admitted w/ chest pain/STEMI  and spontaneous dissection of LM requiring emergent CABG x 2. Now w/ cardiogenic shock s/p ECMO (decannulated 6/20), requiring Impella and vent support. -PMH: DM, HTN, HLD, CVA  Patient was started on tube feeds 6/16, however patient has not been tolerating well and tube feeds had to be stopped on 6/23.  Cortrak was clamped on 6/23 due to dark brown emesis.    Concentrating TPN as much as possible to limit fluid intake given heart failure and worsened renal function.    Glucose / Insulin: hx DM, CBGs 143-218, on Sensitive Novolog SSI q4h -12 units last 24h. - hydrocortisone 50 mg IV q6h Electrolytes: Na 130, K 3.9, Bicarb 20 low, CoCa 9.38, Phos 5.4 up, Mg 2.7 up, on Lasix infusion at 63m/hr  Renal: AKI 2/2 shock/ATN - Scr 4.07 rising, BUN 38 on Lasix infusion  Hepatic: AST 72 rising, ALT wnl, alk phos wnl, Tbili 2.1 up, LDH 1242 up, TG 65  GI meds: Docusate 2052md, tube PPI/d, Miralax BID, Senna syr daily,   Intake / Output; MIVF: UOP 1845 mL,  KVO fluids, +10.3 L since admit - LBM 6/23 recorded stool - Chest tube: 280 - NG 700   GI Imaging: 6/24 XR abdomen: no acute abnormality  GI Surgeries / Procedures:  N/a   Central access: placing consult for access 6/24 TPN start date: 6/24  Nutritional Goals (per RD recommendation on 6/23, 6/24): kCal: 2100-2300, Protein: 125-140g, Fluid: >=1.9 L Goal TPN rate is 75 mL/hr (provides 140 g of protein and 2200 kcals per day)  Current Nutrition:  NPO and TPN- to start 6/24 - 6/25: Prealbumin only 5.5 Trial of trickle TF as able. Pivot 1.5 at 20 ml/hr, goal rate of 60 ml/hr  Plan:  Trickle TF challenge  this weekend. Increase TPN to [id number]. Concentrate TPN to limit fluids (using Clinisol 15%)  Electrolytes in TPN:Con't  Na 66m52m, K 0mEq71m Ca 5mEq/52mMg 0mEq/L21mnd Phos 0mmol/L66max acetate Will assess need to add long-acting insulin with increased TPN rate Add standard MVI and trace elements to TPN Monitor TPN labs on Mon/Thurs and PRN   Junah Yam S. RobertsoAlford Highland, BCPS Clinical Staff Pharmacist [email or web address] 06/15/2021,8:15 AM

## 2021-06-15 NOTE — Progress Notes (Signed)
[place] for IV Heparin Indication:  Impella , Afib  No Known Allergies  Patient Measurements: Height: 5\' 3"  (160 cm) Weight: 102.7 kg (226 lb 6.6 oz) IBW/kg (Calculated) : 52.4 Heparin Dosing Weight: 72 kg  Vital Signs: Temp: 100.94 F (38.3 C) (06/25 0945) Temp Source: Bladder (06/25 0945) BP: 130/89 (06/25 0945) Pulse Rate: 86 (06/25 0945)  Labs: Recent Labs    06/02/2021 0359 05/23/2021 0954 06/14/2021 1720 05/23/2021 1721 06/14/21 0326 06/14/21 1648 06/15/21 0440 06/15/21 0639  HGB 7.7*  --  7.7*   < > 7.6*  --  7.0* 7.1*  HCT 23.0*  --  23.3*   < > 22.3*  --  20.3* 21.0*  PLT 49*  --  50*  --  64*  --  90*  --   APTT 64*  --   --   --  56*  --  48*  --   LABPROT 18.9*  --   --   --  19.4*  --  17.5*  --   INR 1.6*  --   --   --  1.6*  --  1.4*  --   HEPARINUNFRC  --  <0.10*  --   --  <0.10*  --  <0.10*  --   CREATININE 2.81*  --   --    < > 3.27* 3.70* 4.07*  --    < > = values in this interval not displayed.     Estimated Creatinine Clearance: 16.8 mL/min (A) (by C-G formula based on SCr of 4.07 mg/dL (H)).   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 Cristopher Peru)   Thyroid disease    PROCEDURE FOR THYROID 15 YRS AGO AT [facility]   WPW (Wolff-Parkinson-White syndrome)     Medications:  Infusions:   sodium chloride Stopped (06/11/2021 0918)   amiodarone 60 mg/hr (06/15/21 0700)   ceFEPime (MAXIPIME) IV Stopped (06/14/21 1730)   epinephrine 5 mcg/min (06/15/21 0700)   furosemide (LASIX) 200 mg in dextrose 5% 100 mL (2mg /mL) infusion 12 mg/hr (06/15/21 0700)   heparin 50 units/mL (Impella PURGE) in dextrose 5 % 1000 mL bag     HYDROmorphone     lactated ringers 10 mL/hr at 06/14/21 2053    midazolam     norepinephrine (LEVOPHED) Adult infusion 30 mcg/min (06/15/21 0700)   TPN ADULT (ION) 30 mL/hr at 06/15/21 0700   TPN ADULT (ION)     vancomycin Stopped (05/27/2021 2104)   vasopressin 0.03 Units/min (06/15/21 0700)    Assessment:  31 yoF admitted with cardiogenic shock 2/2 LM SCAD. Pt s/p CABG with VA-ECMO and Impella CP placement. ECMO decannulated on 6/20. Continues in A-flutter  Pt has heparin half-strength purge (25 uts/hr)  infusing 42ml/hr (375 uts/hr ) <HL 0.1 this am aptt down 48sec  h/h slowly trend down this am 7/21 LDH up 1200 fibrinogen up 788  pltc low but increasing 49>90. S/p open chest and washout 6/23 still CT output  HIT negative 6/21 Systemic heparin on hold for now   Goal of Therapy:  Heparin level 0.3-0.5  Monitor platelets by anticoagulation protocol: Yes   Plan:  Increase heparin purge solution 50uts/ml and recheck aptt and heparin level  later today  Daily heparin level CBC   Bonnita Nasuti Pharm.D. CPP, BCPS Clinical Pharmacist [phone] 06/15/2021 10:01 AM

## 2021-06-15 NOTE — Progress Notes (Signed)
Advanced Heart Failure Rounding Note   Subjective:    6/15 - LM coronary scad with emergent CABG  -> ECMO 6/17 - chest washout at bedside 6/19 - bronch due to RUL mucous plugging  6/20 - ECMO decannulation. Intra-op TEE EF ~35% 6/21 - Repeat washout 6/21 - DCCV   Decannulated from ECMO 6/20 Chest remains open. Sedated on vent.   Remains on Impella now up to  P-7 Flow 3.1 Waveforms ok  MAPs 70-80s  Vent up to 100% CXR diffuse bilateral infiltrates   Now on epi 5 NE  43-> 30,  VP 0.03. Off milrinone  On lasix gtt 12.  Had 1.8 out. Cr up 3.0 -> 3.3 -> 4.1  Co-ox 49%   Objective:   Weight Range:  Vital Signs:   Temp:  [99.1 F (37.3 C)-100 F (37.8 C)] 100 F (37.8 C) (06/25 0400) Pulse Rate:  [71-116] 99 (06/25 0848) Resp:  [18-39] 29 (06/25 0848) BP: (103-131)/(68-85) 120/68 (06/25 0848) SpO2:  [87 %-100 %] 97 % (06/25 0848) Arterial Line BP: (103-131)/(61-80) 116/71 (06/25 0800) FiO2 (%):  [60 %-100 %] 100 % (06/25 0848) Weight:  [102.7 kg] 102.7 kg (06/25 0615) Last BM Date: 06/06/2021  Weight change: Filed Weights   05/30/2021 0500 06/14/21 0500 06/15/21 0615  Weight: 99.9 kg 99.3 kg 102.7 kg    Intake/Output:   Intake/Output Summary (Last 24 hours) at 06/15/2021 0947 Last data filed at 06/15/2021 0700 Gross per 24 hour  Intake 3590.02 ml  Output 2655 ml  Net 935.02 ml      Physical Exam: General: Sedated on vent. Chest open  HEENT: normal + ETT Neck: supple.RIJ introducer  Cor: chest open esmark ok +CTs  Lungs: clear Abdomen: obese soft, nontender, nondistended. No hepatosplenomegaly. No bruits or masses. Good bowel sounds. Extremities: no cyanosis, clubbing, rash, 1-2+ edema  RFA impella Neuro: intubated/sedated   Telemetry: NSR with frequent PACs 90-105 Personally reviewed   Labs: Basic Metabolic Panel: Recent Labs  Lab 06/11/21 0331 06/11/21 0542 06/06/2021 0330 05/25/2021 0459 05/25/2021 0359 06/20/2021 1721 06/14/21 0322 06/14/21 0326  06/14/21 1648 06/15/21 0440 06/15/21 0639  NA 136   < > 133*   < > 133* 131* 132* 130* 131* 130* 130*  K 4.5   < > 4.3   < > 3.9 4.2 4.3 4.2 4.2 3.8 3.9  CL 108   < > 104   < > 105 102  --  100 99 98  --   CO2 21*   < > 20*   < > 21* 20*  --  19* 21* 20*  --   GLUCOSE 102*   < > 141*   < > 140* 132*  --  138* 142* 157*  --   BUN 16   < > 20   < > 23* 28*  --  29* 31* 38*  --   CREATININE 1.88*   < > 2.70*   < > 2.81* 3.02*  --  3.27* 3.70* 4.07*  --   CALCIUM 8.1*   < > 7.8*   < > 7.7* 7.4*  --  7.4* 7.6* 7.7*  --   MG 1.7  --  2.2  --   --   --   --  2.8*  --  2.7*  --   PHOS  --   --   --   --   --   --   --  5.8*  --  5.4*  --    < > =  values in this interval not displayed.     Liver Function Tests: Recent Labs  Lab 06/11/21 0331 06/03/2021 0330 06/18/2021 0359 06/14/21 0326 06/15/21 0440  AST 74* 75* 59* 59* 72*  ALT _0 ALKPHOS 62 65 67 75 76  BILITOT 0.8 1.0 1.3* 1.7* 2.1*  PROT 4.6* 4.8* 4.5* 4.6* 5.0*  ALBUMIN 2.5* 2.4* 2.0* 2.0* 1.9*    No results for input(s): LIPASE, AMYLASE in the last 168 hours. No results for input(s): AMMONIA in the last 168 hours.  CBC: Recent Labs  Lab 06/01/2021 2150 05/23/2021 0357 06/18/2021 0359 06/11/2021 1720 06/14/21 0322 06/14/21 0326 06/15/21 0440 06/15/21 0639  WBC 14.7*  --  17.2* 19.5*  --  24.2* 29.5*  --   NEUTROABS  --   --   --   --   --   --  23.8*  --   HGB 8.2*   < > 7.7* 7.7* 7.5* 7.6* 7.0* 7.1*  HCT 24.6*   < > 23.0* 23.3* 22.0* 22.3* 20.3* 21.0*  MCV 90.1  --  89.8 89.6  --  88.1 86.8  --   PLT 47*  --  49* 50*  --  64* 90*  --    < > = values in this interval not displayed.     Cardiac Enzymes: No results for input(s): CKTOTAL, CKMB, CKMBINDEX, TROPONINI in the last 168 hours.  BNP: BNP (last 3 results) No results for input(s): BNP in the last 8760 hours.  ProBNP (last 3 results) No results for input(s): PROBNP in the last 8760 hours.    Other results:  Imaging: DG Abd 1 View  Result Date:  06/14/2021 CLINICAL DATA:  Constipation. Status post sternal wound debridement on 06/16/2021 and ventricular assist device insertion and coronary artery bypass graft x 2 on 06/08/2021. EXAM: ABDOMEN - 1 VIEW COMPARISON:  06/09/2021 FINDINGS: Paucity of intestinal gas with no visible dilated bowel loops and no visible stool in the included portions of the colon. Feeding tube tip in the duodenal bulb. Nasogastric tube tip in the distal stomach. Partially included Swan-Ganz catheter. Right ventriculoperitoneal shunt catheter tip in the right mid abdomen medially. Expected postsurgical air in the mediastinum. Unremarkable bones. IMPRESSION: No acute abnormality.  No visible stool in the colon. Electronically Signed   By: Claudie Revering M.D.   On: 06/14/2021 08:42   DG Chest Port 1 View  Result Date: 06/15/2021 CLINICAL DATA:  60 year old woman with hx of HTN, HLD, hypothyroidism, CVA, DM2 who presented with angina. Underwent cardiac cath which showed left main dissection, during this procedure became acutely unstable with crushing chest pain and shock. Taken emergently to OR for bypass after 3-5 impella placed. Tough to wean from bypass so left centrally cannulated on VA ECMO and sent to [place]. EXAM: PORTABLE CHEST 1 VIEW COMPARISON:  06/14/2021 and older exams. FINDINGS: There has been interval worsening with an increase in bilateral airspace lung opacities. No convincing pleural effusion.  No pneumothorax. Endotracheal tube tip lies at the medial clavicular heads, currently projecting 3.5 cm above the carina. Nasal/orogastric tube, feeding tube, right internal jugular central venous line, mediastinal tube and left chest tube are stable. No mediastinal widening. IMPRESSION: 1. Interval worsening of lung aeration with increased bilateral airspace lung opacities consistent with pulmonary edema. 2. Stable support apparatus. Electronically Signed   By: Lajean Manes M.D.   On: 06/15/2021 07:55   DG CHEST PORT 1  VIEW  Result Date: 06/14/2021 CLINICAL  DATA:  Cardiorespiratory failure. EXAM: PORTABLE CHEST 1 VIEW COMPARISON:  05/22/2021 FINDINGS: Endotracheal tube is 7.0 cm above the carina near the thoracic in light. Again noted is a left ventricular assist device with the tip in the left ventricle region. Pulmonary artery catheter tip is in the main pulmonary artery/proximal right pulmonary artery region. Again noted are chest drains. Negative for pneumothorax. Hazy lung densities are similar to the previous examination and minimally changed. Basilar lung densities could represent atelectasis and difficult to exclude right pleural effusion. Heart size is grossly stable. IMPRESSION: 1. Endotracheal tube has been retracted and near the thoracic inlet. Recommend attention to this area. 2. Stable appearance of the support apparatuses. 3. Stable hazy lung densities bilaterally. Findings could represent pulmonary edema. These results will be called to the ordering clinician or representative by the Radiologist Assistant, and communication documented in the PACS or [employer]. Electronically Signed   By: Markus Daft M.D.   On: 06/14/2021 09:14   DG Abd Portable 1V  Result Date: 06/14/2021 CLINICAL DATA:  Tube placement. EXAM: PORTABLE ABDOMEN - 1 VIEW COMPARISON:  Radiograph earlier this day. FINDINGS: Tip of the weighted enteric tube is in the right upper quadrant of the abdomen likely in the proximal duodenum. There is a second enteric tube in place with tip in the stomach. Overlying drains as well as monitoring devices. IMPRESSION: Tip of the weighted enteric tube in the right upper quadrant of the abdomen likely in the proximal duodenum. Second enteric tube with tip in the stomach. Electronically Signed   By: Keith Rake M.D.   On: 06/14/2021 19:49     Medications:     Scheduled Medications:  sodium chloride   Intravenous Once   artificial tears  1 application Both Eyes F7P   aspirin  324 mg Per Tube  Daily   chlorhexidine gluconate (MEDLINE KIT)  15 mL Mouth Rinse BID   Chlorhexidine Gluconate Cloth  6 each Topical Daily   docusate  200 mg Per Tube Daily   hydrocortisone sod succinate (SOLU-CORTEF) inj  50 mg Intravenous Q6H    HYDROmorphone (DILAUDID) injection  1 mg Intravenous Once   insulin aspart  0-9 Units Subcutaneous Q4H   mouth rinse  15 mL Mouth Rinse 10 times per day   pantoprazole sodium  40 mg Per Tube Daily   polyethylene glycol  17 g Oral BID   rocuronium  100 mg Intravenous Once   rosuvastatin  40 mg Per Tube q1800   sennosides  10 mL Per Tube Daily   sodium chloride flush  10-40 mL Intracatheter Q12H   sodium chloride flush  3 mL Intravenous Q12H    Infusions:  sodium chloride Stopped (06/08/2021 0918)   amiodarone 60 mg/hr (06/15/21 0700)   ceFEPime (MAXIPIME) IV Stopped (06/14/21 1730)   epinephrine 5 mcg/min (06/15/21 0700)   furosemide (LASIX) 200 mg in dextrose 5% 100 mL (59m/mL) infusion 12 mg/hr (06/15/21 0700)   heparin 50 units/mL (Impella PURGE) in dextrose 5 % 1000 mL bag     HYDROmorphone     lactated ringers 10 mL/hr at 06/14/21 2053   midazolam     norepinephrine (LEVOPHED) Adult infusion 30 mcg/min (06/15/21 0700)   TPN ADULT (ION) 30 mL/hr at 06/15/21 0700   TPN ADULT (ION)     vancomycin Stopped (06/03/2021 2104)   vasopressin 0.03 Units/min (06/15/21 0700)    PRN Medications: sodium chloride, sodium chloride, sodium chloride, acetaminophen (TYLENOL) oral liquid 160 mg/5 mL, bisacodyl, HYDROmorphone,  ipratropium-albuterol, metoprolol tartrate, midazolam, midazolam, ondansetron (ZOFRAN) IV, polyvinyl alcohol, sodium chloride flush, sodium chloride flush, vecuronium   Assessment/Plan   Cardiogenic shock in setting of left main dissection and emergent CABG - chest open on V-A ECMO with Impella vent. S/p chest washout 6/17 - TEE 9/19 EF 35-40% - ECMO decannulated 6/20  - Impella at P-7  Flow 3.1 Waveforms ok - On EPI 5 NE 30 VP 0.03  Co-ox  49% - She remains critically ill with MSOF. Co-ox low.  Discussed possible upgrade to Impella 5.5 with TCTS and family. Family does not want this. Now flowing better on Impella - Continues to require high-dose pressors. Needs fluid off to permit chest closure. Continue lasix gtt to 12 - D/w CCM. Appears to be developing ARDS with increasing vent requirement and worsening CXR. Will switch to ARDS protocol - Continue diuresis. Possible initiation of CVVHD tomorrow. - LDH way up on impella. 769 -> 1242 Hgb stable. Platelets improving. Will need to follow closely.   2. CAD with acute due to left main dissection - s/p emergent CABG 05/24/2021 - continue ASA/statin  3. Acute respiratory failure due to above -> ARDS - On vent. Attempting to diurese as tolerated - CCM managing vent - Plan as above. Switch to ARDS protocol   4 AKI - due to shock/ATN - SCr baseline 1.7 -> 2.7 -> 2.8 -. 3.2 -> 4.1 - continue support. Keep MAPs > 70 - may need to consider CVVHD soon   5. Hypomag/hypokalemia - supp as needed  6. Anemia due to expected post-operative blood loss and ECMO circuit - transfuse to keep hgb >= 8.0.  7. Thrombocytopenia - due to critical illness. PLTs 45k -> 32k -> 64k -> 90k - has h/o Von Willebrands deficiency - has received low-dose DDAVP - likely due to critical illness/Impella - HIT negative  8. Sepsis - developed high fevers on 6/21. Vanc/cefepime added - WBC 19.5 -> 24.2k -> 29.5k - Cx remain NGTD - continue abx  9. Atrial fibrillation, paroxysmal - back in NSR after DCCV 6/21 - continue amio   10. Post-operative ileus - had BM 6/23. Flexi-seal in place - getting trickle feeds va Cor-trak   Prognosis very tenuous. Family updated.    CRITICAL CARE Performed by: Glori Bickers  Total critical care time: 50 minutes  Critical care time was exclusive of separately billable procedures and treating other patients.  Critical care was necessary to treat or prevent  imminent or life-threatening deterioration.  Critical care was time spent personally by me (independent of midlevel providers or residents) on the following activities: development of treatment plan with patient and/or surrogate as well as nursing, discussions with consultants, evaluation of patient's response to treatment, examination of patient, obtaining history from patient or surrogate, ordering and performing treatments and interventions, ordering and review of laboratory studies, ordering and review of radiographic studies, pulse oximetry and re-evaluation of patient's condition.    Length of Stay: 10   Glori Bickers MD 06/15/2021, 9:47 AM  Advanced Heart Failure Team Pager [phone] (M-F; 7a - 4p)  Please contact [facility] for night-coverage after hours (4p -7a ) and weekends on [email or web address]

## 2021-06-15 NOTE — Progress Notes (Signed)
Spoke with Elmyra Ricks, primary RN for pt. Requesting new TPN tubing anf filter for a new IV site. Explained the IVT does not keep TPN tubing/filters, and that pharmacy prepares all TPN for [place], including the tubing/filters. RN stated she would call pharmacy for direction.

## 2021-06-15 NOTE — Progress Notes (Signed)
3 Days Post-Op Procedure(s) (LRB): STERNAL WOUND DEBRIDEMENT (N/A) Subjective:  Intubated and sedated on vent with open sternum.  Hemodynamically stable on epi 5, NE 30, vaso 0.03. Impella functioning well at P7. Co-ox low at 49.  Oxygen requirement has increased to 100% on vent with ARDS picture on CXR  Objective: Vital signs in last 24 hours: Temp:  [99.1 F (37.3 C)-100.94 F (38.3 C)] 100.94 F (38.3 C) (06/25 0945) Pulse Rate:  [71-116] 86 (06/25 0945) Cardiac Rhythm: Atrial fibrillation;Normal sinus rhythm (06/25 0400) Resp:  [18-39] 18 (06/25 0945) BP: (103-131)/(68-89) 130/89 (06/25 0945) SpO2:  [87 %-100 %] 100 % (06/25 0945) Arterial Line BP: (103-131)/(61-80) 129/79 (06/25 0945) FiO2 (%):  [60 %-100 %] 100 % (06/25 0848) Weight:  [102.7 kg] 102.7 kg (06/25 0615)  Hemodynamic parameters for last 24 hours: CVP:  [11 mmHg-16 mmHg] 11 mmHg  Intake/Output from previous day: 06/24 0701 - 06/25 0700 In: 3980.3 [I.V.:3125.7; NG/GT:360; IV Piggyback:100] Out: 2825 [Urine:1845; Emesis/NG output:700; Chest Tube:280] Intake/Output this shift: No intake/output data recorded.  General appearance: sedated on vent Heart: regular rate and rhythm, S1, S2 normal, no murmur, Lungs: rales bilaterally Abdomen: soft, bowel sounds hypoactive. Extremities: edema moderate anasarca Wound: esmark intact and sucked in with serosanguinous chest tube drainage.  Lab Results: Recent Labs    06/14/21 0326 06/15/21 0440 06/15/21 0639  WBC 24.2* 29.5*  --   HGB 7.6* 7.0* 7.1*  HCT 22.3* 20.3* 21.0*  PLT 64* 90*  --    BMET:  Recent Labs    06/14/21 1648 06/15/21 0440 06/15/21 0639  NA 131* 130* 130*  K 4.2 3.8 3.9  CL 99 98  --   CO2 21* 20*  --   GLUCOSE 142* 157*  --   BUN 31* 38*  --   CREATININE 3.70* 4.07*  --   CALCIUM 7.6* 7.7*  --     PT/INR:  Recent Labs    06/15/21 0440  LABPROT 17.5*  INR 1.4*   ABG    Component Value Date/Time   PHART 7.295 (L)  06/15/2021 0639   HCO3 20.8 06/15/2021 0639   TCO2 22 06/15/2021 0639   ACIDBASEDEF 5.0 (H) 06/15/2021 0639   O2SAT 86.0 06/15/2021 0639   CBG (last 3)  Recent Labs    06/15/21 0016 06/15/21 0446 06/15/21 0747  GLUCAP 186* 158* 145*   CXR: ARDS  Assessment/Plan:  She remains critically ill with MSOF, open sternum, still on multiple vasopressors and Impella.  Developing ARDS with increasing oxygen requirement. CCM managing with ARDS protocol.   Fever this am and central line changed by CCM.  Urine output ok but still +1155 yesterday on lasix 12 mg/hr. Holding off on CRRT today. Creatinine is rising. CVP 14 this am so volume status not too bad.    LOS: 10 days    Gaye Pollack 06/15/2021

## 2021-06-15 NOTE — Progress Notes (Signed)
Notified Dr. Haroldine Laws of CVP increase 20/21 and UOP 20 x3 hours.  New orders received and carried out.

## 2021-06-15 NOTE — Plan of Care (Signed)
  Problem: Activity: Goal: Ability to tolerate increased activity will improve Outcome: Not Progressing   Problem: Respiratory: Goal: Ability to maintain a clear airway and adequate ventilation will improve Outcome: Not Progressing Note: ARDS protocol initiated today   Problem: Role Relationship: Goal: Method of communication will improve Outcome: Not Progressing

## 2021-06-15 NOTE — Progress Notes (Signed)
Patient ID: [name] [name], female   DOB: 21-Aug-1961, 60 y.o.   MRN: [id number] TCTS Evening Rounds:  She was paralyzed by CCM today and oxygenation better. Down to 50%.  Hemodynamics improved and NE down to 13, epi down to 3, vasopressin off.  Unfortunately she is only making 10-15 cc/hr urine despite increased lasix to 20 mg/hr. Will likely need CRRT tomorrow.  CT output remains low.  BMET    Component Value Date/Time   NA 132 (L) 06/15/2021 1620   K 4.2 06/15/2021 1620   CL 98 06/15/2021 1620   CO2 21 (L) 06/15/2021 1620   GLUCOSE 138 (H) 06/15/2021 1620   BUN 46 (H) 06/15/2021 1620   CREATININE 4.52 (H) 06/15/2021 1620   CALCIUM 7.8 (L) 06/15/2021 1620   GFRNONAA 11 (L) 06/15/2021 1620   GFRAA 34 (L) 05/24/2020 1503

## 2021-06-15 NOTE — Progress Notes (Signed)
Nutrition Follow-up  DOCUMENTATION CODES:   Not applicable  INTERVENTION:  TPN to meet nutritional needs-goal is to meet 100% protein needs while minimizing fluid; TPN order per Pharmacy  Given advancement of Cortrak, initiate trial of trickle TF and advance rate as able: Pivot 1.5 @ 45ml/hr, goal rate of 88ml/hr   NUTRITION DIAGNOSIS:   Inadequate oral intake related to acute illness as evidenced by NPO status.  ongoing  GOAL:   Patient will meet greater than or equal to 90% of their needs  Being addressed via nutrition support  MONITOR:   Vent status, TF tolerance, Labs, Weight trends  REASON FOR ASSESSMENT:   Consult Enteral/tube feeding initiation and management  ASSESSMENT:   60 yo female admitted with chest pain with STEMI requiring emergent CABG, cardiogenic shock in setting of left main dissection requiring VA ECMO, intubation PMH includes DM, HTN, HLD, CVA  6/14 Admitted 6/15 Cannulated for VA ECMO, Intubated 6/16 TF initiated at 20 ml/hr 6/19 Trickle TF held, OG-LIS 6/20 OR-VA decannulation 6/21 Trickle TF restarted, did not tolerate, bile coming from mouth overnight-OG-LIS with 1200 mL out 6/22 Bedside chest washout with evacuation of mediastinal clot for cardiac tamponade, Cortrak placed gastric-unable to advance to postpyloric position 6/23 +BM finally, trickle TF restarted but held again after pt vomited, OG placed to LIS with 1100 mL dark output 6/24 tolerating fluid removal with lasix infusion per CCM, oxygenation improving, slight decrease in vasopressor requirements   Pt remains on vent support, Impella in place, chest open. Still requiring levophed, vasopressin and epinephrine, though CCM notes plan to wean.  Pt initiated on TPN yesterday. TPN being concentrated to limit fluid intake given heart failure and worsened renal function, but should still provide 100% of pt's protein needs. TPN advancing to 70ml/hr today. Goal TPN rate is 14ml/hr (provides  140g protein and 2200 kcals per day). Pt's Cortrak was advanced into the small intestine yesterday, tube tip likely in the proximal duodenum per abdominal xray. Plan for trial of trickle TF this weekend per CCM; will begin Pivot 1.5 @ 2ml/hr, to be advanced as able/tolerated with goal rate of 66ml/hr. OG tube remains to suction in the stomach.  UOP: 1817ml x24 hours OGT: 710ml output x24 hours Chest tube: 260ml output x24 hours I/O: +10,338ml since admit  No BM x2 days; bowel regimen was restarted. Rectal tube in place.   Per RN assessment, pt with moderate pitting edema to BUE and deep pitting edema to BLE.   Admit weight: 89.5 kg Current weight: 102.7 kg  Medications: colace, SSI, protonix, miralax, senokot Drips: amiodarone, epinephrine, lasix, midazolam, norepinephrine, vasopressin Labs: Na 130 (L), Ionized Ca 1.14 (L), PO4 5.4 (H), Mg 2.7 (H), Cr 4.07 (H, trending up) CBGs: [id number]  Diet Order:   Diet Order             Diet NPO time specified  Diet effective now                   EDUCATION NEEDS:   Not appropriate for education at this time  Skin:  Skin Assessment: Skin Integrity Issues: Skin Integrity Issues:: Incisions Incisions: open sternum  Last BM:  6/23  Height:   Ht Readings from Last 1 Encounters:  05/26/2021 5\' 3"  (1.6 m)    Weight:   Wt Readings from Last 1 Encounters:  06/15/21 102.7 kg   BMI:  Body mass index is 40.11 kg/m.  Estimated Nutritional Needs:   Kcal:  2100-2300 kcals  Protein:  125-140 g  Fluid:  >/= 1.9 L    Larkin Ina, MS, RD, LDN (she/her/hers) RD pager number and weekend/on-call pager number located in [place].

## 2021-06-15 NOTE — Progress Notes (Signed)
[name] [name] for IV Heparin Indication:  Impella , Afib  No Known Allergies  Patient Measurements: Height: 5\' 3"  (160 cm) Weight: 102.7 kg (226 lb 6.6 oz) IBW/kg (Calculated) : 52.4 Heparin Dosing Weight: 72 kg  Vital Signs: Temp: 98.42 F (36.9 C) (06/25 1715) Temp Source: Bladder (06/25 1600) BP: 113/87 (06/25 1715) Pulse Rate: 95 (06/25 1715)  Labs: Recent Labs    05/23/2021 0359 05/27/2021 0954 06/18/2021 1720 05/23/2021 1721 06/14/21 0326 06/14/21 1648 06/15/21 0440 06/15/21 0639 06/15/21 1301 06/15/21 1441 06/15/21 1620  HGB 7.7*  --  7.7*   < > 7.6*  --  7.0* 7.1* 8.8* 8.8*  --   HCT 23.0*  --  23.3*   < > 22.3*  --  20.3* 21.0* 26.0* 26.0*  --   PLT 49*  --  50*  --  64*  --  90*  --   --   --   --   APTT 64*  --   --   --  56*  --  48*  --   --   --  50*  LABPROT 18.9*  --   --   --  19.4*  --  17.5*  --   --   --   --   INR 1.6*  --   --   --  1.6*  --  1.4*  --   --   --   --   HEPARINUNFRC  --  <0.10*  --   --  <0.10*  --  <0.10*  --   --   --   --   CREATININE 2.81*  --   --    < > 3.27* 3.70* 4.07*  --   --   --  4.52*   < > = values in this interval not displayed.     Estimated Creatinine Clearance: 15.1 mL/min (A) (by C-G formula based on SCr of 4.52 mg/dL (H)).   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 Cristopher Peru)   Thyroid disease    PROCEDURE FOR THYROID 15 YRS AGO AT [facility]   WPW (Wolff-Parkinson-White syndrome)     Medications:  Infusions:   sodium chloride Stopped (06/19/2021 0918)   albumin human 60 mL/hr at 06/15/21 1700   amiodarone 60 mg/hr (06/15/21 1700)   ceFEPime (MAXIPIME) IV Stopped (06/14/21 1730)   epinephrine 3 mcg/min (06/15/21 1700)   feeding supplement  (PIVOT 1.5 CAL) 1,000 mL (06/15/21 1601)   furosemide (LASIX) 200 mg in dextrose 5% 100 mL (2mg /mL) infusion 20 mg/hr (06/15/21 1700)   heparin 50 units/mL (Impella PURGE) in dextrose 5 % 1000 mL bag     HYDROmorphone 4 mg/hr (06/15/21 1700)   lactated ringers Stopped (06/15/21 1137)   midazolam 5 mg/hr (06/15/21 1700)   norepinephrine (LEVOPHED) Adult infusion 16 mcg/min (06/15/21 1700)   TPN ADULT (ION) Stopped (06/15/21 1141)   TPN ADULT (ION) 55 mL/hr at 06/15/21 1715   vancomycin Stopped (06/19/2021 2104)   vasopressin 0.02 Units/min (06/15/21 1700)    Assessment:  18 yoF admitted with cardiogenic shock 2/2 LM SCAD. Pt s/p CABG with VA-ECMO and Impella CP placement. ECMO decannulated on 6/20. Continues in A-flutter  Pt  was on heparin half-strength purge (25 uts/hr)  infusing 56ml/hr (375 uts/hr ) <HL 0.1 this am aptt down 48sec  h/h slowly trend down this am 7/21 LDH up 1200 fibrinogen up 788  pltc low but increasing 49>90. S/p open chest and washout 6/23 still CT output  HIT negative 6/21 Systemic heparin on hold for now   Patient transitioned to full strength heparin purge this am. Aptt relatively unchanged at 50s, heparin level appears delayed but most likely also undetectable. No bleeding issues noted, no adjustments tonight  Goal of Therapy:  Heparin level 0.3-0.5  Monitor platelets by anticoagulation protocol: Yes   Plan:  Continue heparin purge solution 50uts/ml and recheck aptt and heparin level later in am  Daily heparin level CBC  Erin Hearing PharmD., BCPS Clinical Pharmacist 06/15/2021 5:32 PM

## 2021-06-15 NOTE — Progress Notes (Signed)
NAME:  [name] [name], MRN:  [id number], DOB:  [birth year], LOS: 26 ADMISSION DATE:  05/23/2021, CONSULTATION DATE:  6/15 REFERRING MD:  Roxan Hockey, CHIEF COMPLAINT:  Chest pain   History of Present Illness:  60 year old woman with hx of HTN, HLD, hypothyroidism, CVA, DM2 who presented with angina.  Underwent cardiac cath which showed left main dissection, during this procedure became acutely unstable with crushing chest pain and shock.  Taken emergently to OR for bypass after 3-5 impella placed.  Tough to wean from bypass so left centrally cannulated on [facility] ECMO and sent to [place].  PCCM consulted to help with management.  Had VA ECMO through 6/20; Sternal wound debridement on 6/22.  Pertinent  Medical History  DM2 Hypertension WPW CAD Multiple brain aneursym  Significant Hospital Events: Including procedures, antibiotic start and stop dates in addition to other pertinent events   6/14 admitted 6/15 spontaneous artery dissection left main on cath.CP Impella placed.   6/15 emergent coronary artery bypass graft, LIMA to LAD, SVG to OM.  Brief cardiac arrest of less than 10 minutes immediately prior to initiation of cardiopulmonary bypass. 6/15 unable to wean.  Still unable to wean from bypass so placed on centrally cannulated VA ECMO. 6/16 EEG shows encephalopathy. 6/16 Cold right hand - seen by vascular, conservative management 6/17 Hand improved. 77mL clot removed with washout at the bedside 6/19 RUL collapse - underwent bronchoscopy and aspiration of RUL mucus plug. 6/20 ECMO Decannulation 6/21>> BIS monitoring started , weaning of sedation, wet CXR 6/21 vanc >  6/21 cefepime >  6/22 bedside chest washout; started on aggressive laxatives for severe constipation. 6/23 ongoing hemodynamic instability complicated by ectopy. 6/24 tolerating fluid removal with furosemide infusion.,  Oxygenation improving, slight decrease in vasopressor requirements  Interim History / Subjective:   Dyssynchrony on vent this morning Minimal urine output Not making stool in last 2 days   Objective   Blood pressure 120/68, pulse 99, temperature 100 F (37.8 C), temperature source Core, resp. rate (!) 29, height 5\' 3"  (1.6 m), weight 102.7 kg, SpO2 97 %. CVP:  [11 mmHg-16 mmHg] 11 mmHg  Vent Mode: PRVC FiO2 (%):  [60 %-100 %] 100 % Set Rate:  [22 bmp] 22 bmp Vt Set:  [410 mL] 410 mL PEEP:  [10 cmH20] 10 cmH20 Plateau Pressure:  [21 cmH20] 21 cmH20   Intake/Output Summary (Last 24 hours) at 06/15/2021 0857 Last data filed at 06/15/2021 0700 Gross per 24 hour  Intake 3741.13 ml  Output 2730 ml  Net 1011.13 ml   Filed Weights   06/14/2021 0500 06/14/21 0500 06/15/21 0615  Weight: 99.9 kg 99.3 kg 102.7 kg    Examination:  General:  In bed on vent HENT: NCAT ETT in place PULM: CTA B, vent supported breathing, chest open CV: Irreg irreg, no mgr GI: BS+, soft, nontender MSK: normal bulk and tone Neuro: sedated on vent   Labs/imaging that I have personally reviewed  (right click and "Reselect all SmartList Selections" daily)  ABG: 7.29/43/60 Cr worse Cr 4.1 AST slightly elevated T Bili slightly up LDH high WBC 29.5k PLT 90 INR 1.4  6/21 blood culture > negative  6/25 CXR > images personally reviewed, ETT in place, diffuse bilateral airspace disease  Resolved Hospital Problem list   RUL collapse   Assessment & Plan:  Cardiogenic shock after spontaneous coronary dissection of left main, requiring emergen CABG then ECMO; ECMO catheter removed 6/20, now on impella Impella per cardiology Wean off epinephrine,  levophed, vasopressin for MAP > 65 Hold colchicine  Platelet dysfunction Normocytic anemia Monitor for bleeding Monitor for bleeding Transfuse PRBC for Hgb < 7 gm/dL  Sepsis/fever> cultures negative, possible HCAP, concern for bacteremia Change lines out today: remove introducer from R IJ Place CVL Continue broad spectrum  antibiotics  Constipation Restart bowel regimen  Acute respiratory failure with hypoxemia ARDS Change mechanical ventilation to ARDS protocol Target TVol 6-8cc/kgIBW Target Plateau Pressure < 30cm H20 Target driving pressure less than 15 cm of water Target PaO2 55-65: titrate PEEP/FiO2 per protocol As long as PaO2 to FiO2 ratio is less than 1:150 position in prone position for 16 hours a day Check CVP daily if CVL in place Target CVP less than 4, diurese as necessary Ventilator associated pneumonia prevention protocol  Shock liver> minimal LFT PRN  AKI, but non-oliguric, hopefully leveling off Continue lasix infusion Monitor BMET and UOP Replace electrolytes as needed May need CRRT this weekend, holding off for now  DM2 with hyperglycemia SSI q4h  Best Practice (right click and "Reselect all SmartList Selections" daily)   Diet/type: tubefeeds Pain/Anxiety/Delirium protocol RASS goal -4 to -5 VAP protocol (if indicated): Yes DVT prophylaxis: SCD GI prophylaxis: PPI Glucose control:  SSI Central venous access:  Yes, and it is still needed Arterial line:  Yes, and it is still needed Foley:  Yes, and it is still needed Mobility:  bed rest  PT consulted: N/A Studies pending: None Culture data pending:none  Last reviewed culture data:today Antibiotics:cefepime and vanc Antibiotic de-escalation: no,  continue current rx Stop date: to be determined  Code Status:  limited Last date of multidisciplinary goals of care discussion [per primary] ccm prognosis: Life-threating and Serious Disposition: remains critically ill, will stay in intensive care   Labs   CBC: Recent Labs  Lab 06/19/2021 2150 06/11/2021 0357 06/19/2021 0359 05/24/2021 1720 06/14/21 0322 06/14/21 0326 06/15/21 0440 06/15/21 0639  WBC 14.7*  --  17.2* 19.5*  --  24.2* 29.5*  --   NEUTROABS  --   --   --   --   --   --  23.8*  --   HGB 8.2*   < > 7.7* 7.7* 7.5* 7.6* 7.0* 7.1*  HCT 24.6*   < > 23.0*  23.3* 22.0* 22.3* 20.3* 21.0*  MCV 90.1  --  89.8 89.6  --  88.1 86.8  --   PLT 47*  --  49* 50*  --  64* 90*  --    < > = values in this interval not displayed.    Basic Metabolic Panel: Recent Labs  Lab 06/11/21 0331 06/11/21 0542 05/23/2021 0330 06/20/2021 0459 06/01/2021 0359 05/29/2021 1721 06/14/21 0322 06/14/21 0326 06/14/21 1648 06/15/21 0440 06/15/21 0639  NA 136   < > 133*   < > 133* 131* 132* 130* 131* 130* 130*  K 4.5   < > 4.3   < > 3.9 4.2 4.3 4.2 4.2 3.8 3.9  CL 108   < > 104   < > 105 102  --  100 99 98  --   CO2 21*   < > 20*   < > 21* 20*  --  19* 21* 20*  --   GLUCOSE 102*   < > 141*   < > 140* 132*  --  138* 142* 157*  --   BUN 16   < > 20   < > 23* 28*  --  29* 31* 38*  --   CREATININE  1.88*   < > 2.70*   < > 2.81* 3.02*  --  3.27* 3.70* 4.07*  --   CALCIUM 8.1*   < > 7.8*   < > 7.7* 7.4*  --  7.4* 7.6* 7.7*  --   MG 1.7  --  2.2  --   --   --   --  2.8*  --  2.7*  --   PHOS  --   --   --   --   --   --   --  5.8*  --  5.4*  --    < > = values in this interval not displayed.   GFR: Estimated Creatinine Clearance: 16.8 mL/min (A) (by C-G formula based on SCr of 4.07 mg/dL (H)). Recent Labs  Lab 06/16/2021 0323 05/23/2021 0921 06/06/2021 1649 06/14/2021 0359 05/30/2021 1720 06/14/21 0326 06/15/21 0440  WBC  --  9.2   < > 17.2* 19.5* 24.2* 29.5*  LATICACIDVEN 0.7 0.9  --   --   --   --   --    < > = values in this interval not displayed.    Liver Function Tests: Recent Labs  Lab 06/11/21 0331 06/03/2021 0330 06/18/2021 0359 06/14/21 0326 06/15/21 0440  AST 74* 75* 59* 59* 72*  ALT 8 6 6 6 7   ALKPHOS 62 65 67 75 76  BILITOT 0.8 1.0 1.3* 1.7* 2.1*  PROT 4.6* 4.8* 4.5* 4.6* 5.0*  ALBUMIN 2.5* 2.4* 2.0* 2.0* 1.9*   No results for input(s): LIPASE, AMYLASE in the last 168 hours. No results for input(s): AMMONIA in the last 168 hours.  ABG    Component Value Date/Time   PHART 7.295 (L) 06/15/2021 0639   PCO2ART 43.2 06/15/2021 0639   PO2ART 60 (L)  06/15/2021 0639   HCO3 20.8 06/15/2021 0639   TCO2 22 06/15/2021 0639   ACIDBASEDEF 5.0 (H) 06/15/2021 0639   O2SAT 86.0 06/15/2021 0639     Coagulation Profile: Recent Labs  Lab 06/11/21 0331 06/03/2021 0330 05/23/2021 0359 06/14/21 0326 06/15/21 0440  INR 1.2 1.5* 1.6* 1.6* 1.4*    Cardiac Enzymes: No results for input(s): CKTOTAL, CKMB, CKMBINDEX, TROPONINI in the last 168 hours.  HbA1C: Hgb A1c MFr Bld  Date/Time Value Ref Range Status  06/01/2021 10:03 AM 5.4 4.8 - 5.6 % Final    Comment:    (NOTE)         Prediabetes: 5.7 - 6.4         Diabetes: >6.4         Glycemic control for adults with diabetes: <7.0     CBG: Recent Labs  Lab 06/14/21 1647 06/14/21 2017 06/15/21 0016 06/15/21 0446 06/15/21 0747  GLUCAP 143* 182* 186* 158* 145*    Critical care time: 40 minutes     Roselie Awkward, MD [facility] Pager: [phone] Cell: [phone] If no response, please call [phone] until 7pm After 7:00 pm call Elink  [phone]

## 2021-06-16 ENCOUNTER — Inpatient Hospital Stay (HOSPITAL_COMMUNITY): Payer: 59

## 2021-06-16 DIAGNOSIS — R570 Cardiogenic shock: Secondary | ICD-10-CM

## 2021-06-16 LAB — CULTURE, BLOOD (ROUTINE X 2)
Culture: NO GROWTH
Culture: NO GROWTH

## 2021-06-16 LAB — POCT I-STAT 7, (LYTES, BLD GAS, ICA,H+H)
Acid-base deficit: 10 mmol/L — ABNORMAL HIGH (ref 0.0–2.0)
Acid-base deficit: 8 mmol/L — ABNORMAL HIGH (ref 0.0–2.0)
Bicarbonate: 17.9 mmol/L — ABNORMAL LOW (ref 20.0–28.0)
Bicarbonate: 19.6 mmol/L — ABNORMAL LOW (ref 20.0–28.0)
Calcium, Ion: 1.12 mmol/L — ABNORMAL LOW (ref 1.15–1.40)
Calcium, Ion: 1.06 mmol/L — ABNORMAL LOW (ref 1.15–1.40)
HCT: 23 % — ABNORMAL LOW (ref 36.0–46.0)
HCT: 23 % — ABNORMAL LOW (ref 36.0–46.0)
Hemoglobin: 7.8 g/dL — ABNORMAL LOW (ref 12.0–15.0)
Hemoglobin: 7.8 g/dL — ABNORMAL LOW (ref 12.0–15.0)
O2 Saturation: 89 %
O2 Saturation: 96 %
Patient temperature: 36.9
Patient temperature: 35.1
Potassium: 4.1 mmol/L (ref 3.5–5.1)
Potassium: 4.3 mmol/L (ref 3.5–5.1)
Sodium: 129 mmol/L — ABNORMAL LOW (ref 135–145)
Sodium: 131 mmol/L — ABNORMAL LOW (ref 135–145)
TCO2: 19 mmol/L — ABNORMAL LOW (ref 22–32)
TCO2: 21 mmol/L — ABNORMAL LOW (ref 22–32)
pCO2 arterial: 49.1 mmHg — ABNORMAL HIGH (ref 32.0–48.0)
pCO2 arterial: 44.6 mmHg (ref 32.0–48.0)
pH, Arterial: 7.17 — CL (ref 7.350–7.450)
pH, Arterial: 7.24 — ABNORMAL LOW (ref 7.350–7.450)
pO2, Arterial: 71 mmHg — ABNORMAL LOW (ref 83.0–108.0)
pO2, Arterial: 86 mmHg (ref 83.0–108.0)

## 2021-06-16 LAB — CBC WITH DIFFERENTIAL/PLATELET
Abs Immature Granulocytes: 5.91 10*3/uL — ABNORMAL HIGH (ref 0.00–0.07)
Basophils Absolute: 0.1 10*3/uL (ref 0.0–0.1)
Basophils Relative: 1 %
Eosinophils Absolute: 0 10*3/uL (ref 0.0–0.5)
Eosinophils Relative: 0 %
HCT: 21.4 % — ABNORMAL LOW (ref 36.0–46.0)
Hemoglobin: 7.1 g/dL — ABNORMAL LOW (ref 12.0–15.0)
Immature Granulocytes: 19 %
Lymphocytes Relative: 5 %
Lymphs Abs: 1.5 10*3/uL (ref 0.7–4.0)
MCH: 30.3 pg (ref 26.0–34.0)
MCHC: 33.2 g/dL (ref 30.0–36.0)
MCV: 91.5 fL (ref 80.0–100.0)
Monocytes Absolute: 1.6 10*3/uL — ABNORMAL HIGH (ref 0.1–1.0)
Monocytes Relative: 5 %
Neutro Abs: 21.4 10*3/uL — ABNORMAL HIGH (ref 1.7–7.7)
Neutrophils Relative %: 70 %
Platelets: 85 10*3/uL — ABNORMAL LOW (ref 150–400)
RBC: 2.34 MIL/uL — ABNORMAL LOW (ref 3.87–5.11)
RDW: 17.9 % — ABNORMAL HIGH (ref 11.5–15.5)
WBC: 30.6 10*3/uL — ABNORMAL HIGH (ref 4.0–10.5)
nRBC: 9.6 % — ABNORMAL HIGH (ref 0.0–0.2)

## 2021-06-16 LAB — SAVE SMEAR(SSMR), FOR PROVIDER SLIDE REVIEW

## 2021-06-16 LAB — FIBRINOGEN: Fibrinogen: 606 mg/dL — ABNORMAL HIGH (ref 210–475)

## 2021-06-16 LAB — COMPREHENSIVE METABOLIC PANEL
ALT: 9 U/L (ref 0–44)
AST: 100 U/L — ABNORMAL HIGH (ref 15–41)
Albumin: 2.4 g/dL — ABNORMAL LOW (ref 3.5–5.0)
Alkaline Phosphatase: 65 U/L (ref 38–126)
Anion gap: 15 (ref 5–15)
BUN: 56 mg/dL — ABNORMAL HIGH (ref 6–20)
CO2: 17 mmol/L — ABNORMAL LOW (ref 22–32)
Calcium: 7.7 mg/dL — ABNORMAL LOW (ref 8.9–10.3)
Chloride: 96 mmol/L — ABNORMAL LOW (ref 98–111)
Creatinine, Ser: 5.34 mg/dL — ABNORMAL HIGH (ref 0.44–1.00)
GFR, Estimated: 9 mL/min — ABNORMAL LOW (ref >60)
Glucose, Bld: 137 mg/dL — ABNORMAL HIGH (ref 70–99)
Potassium: 4.1 mmol/L (ref 3.5–5.1)
Sodium: 128 mmol/L — ABNORMAL LOW (ref 135–145)
Total Bilirubin: 4.4 mg/dL — ABNORMAL HIGH (ref 0.3–1.2)
Total Protein: 4.9 g/dL — ABNORMAL LOW (ref 6.5–8.1)

## 2021-06-16 LAB — GLUCOSE, CAPILLARY
Glucose-Capillary: 134 mg/dL — ABNORMAL HIGH (ref 70–99)
Glucose-Capillary: 135 mg/dL — ABNORMAL HIGH (ref 70–99)
Glucose-Capillary: 143 mg/dL — ABNORMAL HIGH (ref 70–99)
Glucose-Capillary: 150 mg/dL — ABNORMAL HIGH (ref 70–99)
Glucose-Capillary: 193 mg/dL — ABNORMAL HIGH (ref 70–99)

## 2021-06-16 LAB — BASIC METABOLIC PANEL
Anion gap: 14 (ref 5–15)
BUN: 41 mg/dL — ABNORMAL HIGH (ref 6–20)
CO2: 18 mmol/L — ABNORMAL LOW (ref 22–32)
Calcium: 7.6 mg/dL — ABNORMAL LOW (ref 8.9–10.3)
Chloride: 99 mmol/L (ref 98–111)
Creatinine, Ser: 3.92 mg/dL — ABNORMAL HIGH (ref 0.44–1.00)
GFR, Estimated: 13 mL/min — ABNORMAL LOW (ref >60)
Glucose, Bld: 141 mg/dL — ABNORMAL HIGH (ref 70–99)
Potassium: 4.3 mmol/L (ref 3.5–5.1)
Sodium: 131 mmol/L — ABNORMAL LOW (ref 135–145)

## 2021-06-16 LAB — LACTIC ACID, PLASMA: Lactic Acid, Venous: 4.2 mmol/L (ref 0.5–1.9)

## 2021-06-16 LAB — COOXEMETRY PANEL
Carboxyhemoglobin: 1.3 % (ref 0.5–1.5)
Methemoglobin: 1.5 % (ref 0.0–1.5)
O2 Saturation: 65.6 %
Total hemoglobin: 7 g/dL — ABNORMAL LOW (ref 12.0–16.0)

## 2021-06-16 LAB — LACTATE DEHYDROGENASE: LDH: 1414 U/L — ABNORMAL HIGH (ref 98–192)

## 2021-06-16 LAB — HEPARIN LEVEL (UNFRACTIONATED): Heparin Unfractionated: 0.11 IU/mL — ABNORMAL LOW (ref 0.30–0.70)

## 2021-06-16 LAB — APTT: aPTT: 71 seconds — ABNORMAL HIGH (ref 24–36)

## 2021-06-16 LAB — PROTIME-INR
INR: 1.4 — ABNORMAL HIGH (ref 0.8–1.2)
Prothrombin Time: 17 seconds — ABNORMAL HIGH (ref 11.4–15.2)

## 2021-06-16 MED ORDER — METOCLOPRAMIDE HCL 5 MG/ML IJ SOLN
5.0000 mg | Freq: Three times a day (TID) | INTRAMUSCULAR | Status: DC
  Administered 2021-06-16 – 2021-06-17 (×4): 5 mg via INTRAVENOUS
  Filled 2021-06-16 (×4): qty 2

## 2021-06-16 MED ORDER — SODIUM CHLORIDE 0.9 % IV SOLN
1.5000 mg/kg/h | INTRAVENOUS | Status: DC
  Administered 2021-06-16: 1.5 mg/kg/h via INTRAVENOUS
  Administered 2021-06-16: 1 mg/kg/h via INTRAVENOUS
  Administered 2021-06-17: 1.5 mg/kg/h via INTRAVENOUS
  Filled 2021-06-16: qty 20
  Filled 2021-06-16 (×4): qty 12.5

## 2021-06-16 MED ORDER — STERILE WATER FOR INJECTION IJ SOLN
INTRAMUSCULAR | Status: AC
  Filled 2021-06-16: qty 10

## 2021-06-16 MED ORDER — TRACE MINERALS CU-MN-SE-ZN 300-55-60-3000 MCG/ML IV SOLN
INTRAVENOUS | Status: DC
  Filled 2021-06-16: qty 840

## 2021-06-16 MED ORDER — PRISMASOL BGK 4/2.5 32-4-2.5 MEQ/L REPLACEMENT SOLN: Status: DC

## 2021-06-16 MED ORDER — PRISMASOL BGK 4/2.5 32-4-2.5 MEQ/L EC SOLN: Status: DC

## 2021-06-16 MED ORDER — SODIUM CHLORIDE 0.9 % IV SOLN
2.0000 g | Freq: Two times a day (BID) | INTRAVENOUS | Status: DC
  Administered 2021-06-16 – 2021-06-17 (×3): 2 g via INTRAVENOUS
  Filled 2021-06-16 (×3): qty 2

## 2021-06-16 MED ORDER — HEPARIN SODIUM (PORCINE) 1000 UNIT/ML DIALYSIS
1000.0000 [IU] | INTRAMUSCULAR | Status: DC | PRN
  Administered 2021-06-17: 2400 [IU] via INTRAVENOUS_CENTRAL
  Filled 2021-06-16 (×3): qty 6
  Filled 2021-06-16: qty 3

## 2021-06-16 MED ORDER — STERILE WATER FOR INJECTION IJ SOLN
INTRAMUSCULAR | Status: AC
  Administered 2021-06-16: 10 mL
  Filled 2021-06-16: qty 10

## 2021-06-16 MED ORDER — SODIUM CHLORIDE 0.9 % IV SOLN
1.0000 mg/kg/h | INTRAVENOUS | Status: DC
  Administered 2021-06-16: 1 mg/kg/h via INTRAVENOUS
  Filled 2021-06-16 (×2): qty 5

## 2021-06-16 MED ORDER — SODIUM CHLORIDE 0.9 % FOR CRRT: INTRAVENOUS_CENTRAL | Status: DC | PRN

## 2021-06-16 MED ORDER — HYDROCORTISONE NA SUCCINATE PF 100 MG IJ SOLR
100.0000 mg | Freq: Two times a day (BID) | INTRAMUSCULAR | Status: DC
  Administered 2021-06-16 – 2021-06-17 (×2): 100 mg via INTRAVENOUS
  Filled 2021-06-16 (×2): qty 2

## 2021-06-16 NOTE — Progress Notes (Signed)
ETT holder changed.

## 2021-06-16 NOTE — Progress Notes (Signed)
[facility] for IV Heparin Indication:  Impella , Afib  No Known Allergies  Patient Measurements: Height: 5\' 3"  (160 cm) Weight: 106.4 kg (234 lb 9.1 oz) IBW/kg (Calculated) : 52.4 Heparin Dosing Weight: 72 kg  Vital Signs: Temp: 97.88 F (36.6 C) (06/26 0825) BP: 112/82 (06/26 0700) Pulse Rate: 93 (06/26 0825)  Labs: Recent Labs    06/14/21 0326 06/14/21 1648 06/15/21 0440 06/15/21 0639 06/15/21 1441 06/15/21 1620 06/16/21 0453 06/16/21 0501  HGB 7.6*  --  7.0*   < > 8.8*  --  7.1* 7.8*  HCT 22.3*  --  20.3*   < > 26.0*  --  21.4* 23.0*  PLT 64*  --  90*  --   --   --  85*  --   APTT 56*  --  48*  --   --  50* 71*  --   LABPROT 19.4*  --  17.5*  --   --   --  17.0*  --   INR 1.6*  --  1.4*  --   --   --  1.4*  --   HEPARINUNFRC <0.10*  --  <0.10*  --   --  <0.10* 0.11*  --   CREATININE 3.27*   < > 4.07*  --   --  4.52* 5.34*  --    < > = values in this interval not displayed.     Estimated Creatinine Clearance: 13.1 mL/min (A) (by C-G formula based on SCr of 5.34 mg/dL (H)).   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 Cristopher Peru)   Thyroid disease    PROCEDURE FOR THYROID 15 YRS AGO AT [facility]   WPW (Wolff-Parkinson-White syndrome)     Medications:  Infusions:    prismasol BGK 4/2.5 500 mL/hr at 06/16/21 0947    prismasol BGK 4/2.5 500 mL/hr at 06/16/21 0950   sodium chloride Stopped (06/02/2021 0918)   sodium chloride 10 mL/hr at 06/16/21 0700   amiodarone 60 mg/hr (06/16/21 1037)   ceFEPime (MAXIPIME) IV Stopped (06/15/21 1831)   epinephrine Stopped (06/16/21 6269)   feeding supplement (PIVOT 1.5 CAL) 1,000 mL (06/15/21 1601)   heparin 50 units/mL (Impella PURGE) in dextrose 5 % 1000  mL bag     HYDROmorphone 8 mg/hr (06/16/21 0700)   ketamine (KETALAR) Adult IV Infusion 1 mg/kg/hr (06/16/21 0902)   lactated ringers Stopped (06/15/21 1137)   midazolam 10 mg/hr (06/16/21 0859)   norepinephrine (LEVOPHED) Adult infusion 10 mcg/min (06/16/21 0700)   prismasol BGK 4/2.5 2,000 mL/hr at 06/16/21 0949   TPN ADULT (ION) 55 mL/hr at 06/16/21 0700   TPN ADULT (ION)     vasopressin Stopped (06/15/21 1833)    Assessment:  48 yoF admitted with cardiogenic shock 2/2 LM SCAD. Pt s/p CABG with VA-ECMO and Impella CP placement. ECMO decannulated on 6/20. Continues in A-flutter  Pt previously on heparin half-strength purge (25 uts/hr)  when had bleeding complications  Resolved  Heparin purge concentration increased 6/25 to 50uts/ml  Purge flow 19ml/hr = 750 uts/hr of heparin aptt increased 71 (BL has been elevated post op) Heparin level 0.11  No systemic heparin currently but with bleeding improved can discuss in am  No Impella flow or pump issues  LDH up 400>1400 over the last week,  fibrinogen up 788> down today 688 pltc low but stable <100 h/h low stable . S/p open chest and washout 6/23 still CT output  HIT negative 6/21  Goal of Therapy:  Heparin level 0.3-0.5  Monitor platelets by anticoagulation protocol: Yes   Plan:  Continue heparin purge solution 50uts/ml  Daily heparin level, CBC   Bonnita Nasuti Pharm.D. CPP, BCPS Clinical Pharmacist [phone] 06/16/2021 11:33 AM

## 2021-06-16 NOTE — Progress Notes (Signed)
Attempted to wean Dilaudid gtt now that Ketamine is running.  Pt BIS spiking up to 60; gave 2mg  versed bolus with no change.  TOF 4/4 and taking guppy breaths.  Notified Dr. Lake Bells; orders to increase Ketamine gtt to 1.5mg /kg/hr.  Orders carried out.

## 2021-06-16 NOTE — Progress Notes (Signed)
[facility] Progress Note Patient Name: CORNEISHA ALVI DOB: 1961/06/27 MRN: [id number]   Date of Service  06/16/2021  HPI/Events of Note  ABG: worsening metabolic acidosis. Going for CRRT for AM.   eICU Interventions  As above. Lasix not helping, no UOP.  On ARDS protocol, fio2 50%, PEEP 14.  RR 35. On levo/low dose epi.  ABG in am. Avoiding bicarb for now. Hco3 at 17, pH 7.17. was 7.19 on 25 th noon.  Discussed with RN.        Intervention Category Intermediate Interventions: Diagnostic test evaluation  Elmer Sow 06/16/2021, 5:16 AM

## 2021-06-16 NOTE — Progress Notes (Signed)
[facility] PCCM  Lactic acid up  Norepi requirement up on CVVHD  I'm worried she has ischemic gut  No role for CT scan as she would not survive to hospital discharge if required laparotomy  Hemolysis? will send haptoglobin, smear  Roselie Awkward, MD [facility] Pager: [phone] Cell: [phone] If no response, please call [phone] until 7pm After 7:00 pm call Elink  [phone]

## 2021-06-16 NOTE — Consult Note (Signed)
Referring Provider: No ref. provider found Primary Care Physician:  Lucianne Lei, MD Primary Nephrologist:     Reason for Consultation:   Acute kidney injury, maintenance euvolemia, assessment of electrolyte abnormalities, assessment of acid-base abnormalities.  HPI: This is a 60 year old lady with history of hypertension hyperlipidemia hypothyroidism history of stroke and diabetes mellitus type 2.  She presented 06/03/2021 with angina that was unstable and underwent cardiac catheterization 06/11/2021 with left main dissection.  She underwent emergent coronary artery bypass surgery requiring a 3-5 Impella device.  She was difficult to wean from bypass and left centrally cannulated for VA ECMO initiated 05/26/2021.  She appears to have baseline serum creatinine about 1.5 mg/dL.  She is undergoing progressive renal insufficiency with now a peak creatinine of 5.34 mg/dL.  We were consulted for consideration of CRRT.   Blood pressure 94/68 pulse 94 temperature 97 O2 sats 96% FiO2 50%  IV epinephrine IV amiodarone     IV Lasix IV norepinephrine  Sodium 129 potassium 4.1 chloride 96 CO2 17 BUN 56 creatinine 5.34 glucose 137 calcium 7.7 albumin 2.4 hemoglobin 8.8  Chest x-ray no change in bilateral airspace lung opacities 06/15/2021  Past Medical History:  Diagnosis Date   Arthritis    Brain aneurysm    Diabetes mellitus without complication (HCC)    Headache    Hypertension        SVT (supraventricular tachycardia) (HCC)    s/p radiofrequency catheter ablation for AVNRT 02/16/09 (Dr. Cristopher Peru)   Thyroid disease    PROCEDURE FOR THYROID 15 YRS AGO AT [facility]   WPW (Wolff-Parkinson-White syndrome)     Past Surgical History:  Procedure Laterality Date   BREAST SURGERY     BX RT BREAST  BENIGN   CANNULATION FOR ECMO (EXTRACORPOREAL MEMBRANE OXYGENATION) N/A 05/30/2021   Procedure: DECANNULATION FOR ECMO (EXTRACORPOREAL MEMBRANE OXYGENATION);  Surgeon: Melrose Nakayama, MD;  Location:  [place];  Service: Open Heart Surgery;  Laterality: N/A;   CORONARY ARTERY BYPASS GRAFT N/A 05/26/2021   Procedure: CORONARY ARTERY BYPASS GRAFTING (CABG)X2.USING  LEFT INTERNAL MAMMARY ARTERY AND  RIGHT ENDOSCOPIC SAPHENOUS VEIN HARVESTING. ECMO INSERTION;  Surgeon: Melrose Nakayama, MD;  Location: [place];  Service: Open Heart Surgery;  Laterality: N/A;   CRANIOTOMY N/A 01/04/2016   Procedure: Suboccipital Craniotomy and Cervical one Laminectomy for Clipping of Aneurysm;  Surgeon: Kevan Ny Ditty, MD;  Location: [place] [facility];  Service: Neurosurgery;  Laterality: N/A;   CRANIOTOMY N/A 07/09/2016   Procedure: Craniotomy for clipping of left middle cerebral artery aneurysm;  Surgeon: Kevan Ny Ditty, MD;  Location: [place] [facility];  Service: Neurosurgery;  Laterality: N/A;  Craniotomy for 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 Christella Noa;  Surgeon: Kevan Ny Ditty, MD;  Location: [place];  Service: Neurosurgery;  Laterality: Right;   ELECTROPHYSIOLOGIC STUDY     LAPAROSCOPIC REVISION VENTRICULAR-PERITONEAL (V-P) SHUNT N/A 02/04/2016   Procedure: LAPAROSCOPIC Insertion VENTRICULAR-PERITONEAL (V-P) SHUNT;  Surgeon: Rolm Bookbinder, MD;  Location: [place] [facility];  Service: General;  Laterality: N/A;   LEFT HEART CATH AND CORONARY ANGIOGRAPHY N/A 05/23/2021   Procedure: LEFT HEART CATH AND CORONARY ANGIOGRAPHY;  Surgeon: Jettie Booze, MD;  Location: [place] [facility];  Service: Cardiovascular;  Laterality: N/A;   RADIOLOGY WITH ANESTHESIA N/A 01/03/2016   Procedure: RADIOLOGY WITH ANESTHESIA;  Surgeon: Medication Radiologist, MD;  Location: [place];  Service: Radiology;  Laterality: N/A;   STERNAL WOUND DEBRIDEMENT N/A 06/16/2021   Procedure: Reexploration and evacuation of hematoma;  Surgeon: Melrose Nakayama, MD;  Location: [place];  Service: Thoracic;   Laterality: N/A;   STERNAL WOUND DEBRIDEMENT N/A 05/22/2021   Procedure: STERNAL WOUND DEBRIDEMENT;  Surgeon: Melrose Nakayama, MD;  Location: [place];  Service: Thoracic;  Laterality: N/A;   TEE WITHOUT CARDIOVERSION N/A 06/20/2021   Procedure: TRANSESOPHAGEAL ECHOCARDIOGRAM (TEE);  Surgeon: Melrose Nakayama, MD;  Location: [place];  Service: Open Heart Surgery;  Laterality: N/A;   VENTRICULAR ASSIST DEVICE INSERTION N/A 06/03/2021   Procedure: VENTRICULAR ASSIST DEVICE INSERTION;  Surgeon: Jettie Booze, MD;  Location: [place] [facility];  Service: Cardiovascular;  Laterality: N/A;   VENTRICULOPERITONEAL SHUNT Right 02/04/2016   Procedure: SHUNT INSERTION VENTRICULAR-PERITONEAL With Laparoscopic Assistance;  Surgeon: Kevan Ny Ditty, MD;  Location: [place] [facility];  Service: Neurosurgery;  Laterality: Right;    Prior to Admission medications   Medication Sig Start Date End Date Taking? Authorizing Provider  acetaminophen (TYLENOL) 500 MG tablet Take 1,000 mg by mouth daily as needed for moderate pain or headache.   Yes [provider]  amLODipine (NORVASC) 5 MG tablet Take 5 mg by mouth every evening.    Yes [provider]  JARDIANCE 10 MG TABS tablet Take 10 mg by mouth daily. 03/15/21  Yes [provider]  levothyroxine (SYNTHROID) 88 MCG tablet Take 88 mcg by mouth daily. 01/28/21  Yes [provider]  potassium chloride (KLOR-CON) 10 MEQ tablet Take 10 mEq by mouth daily. 04/24/21  Yes [provider]  rosuvastatin (CRESTOR) 40 MG tablet Take 40 mg by mouth daily. 01/28/21  Yes [provider]  valsartan-hydrochlorothiazide (DIOVAN-HCT) 160-25 MG tablet Take 1 tablet by mouth daily.   Yes [provider]  Vitamin D, Ergocalciferol, (DRISDOL) 1.25 MG (50000 UNIT) CAPS capsule Take 1 capsule by mouth once a week. 04/24/21  Yes [provider]  cyclobenzaprine (FLEXERIL) 10 MG tablet TAKE 1 TABLET BY MOUTH TWICE DAILY  AS NEEDED FOR MUSCLE SPASMS Patient not taking: No sig reported 06/28/18   Carole Civil, MD  cyclobenzaprine (FLEXERIL) 10 MG tablet TAKE 1 TABLET BY MOUTH TWICE DAILY AS NEEDED FOR MUSCLE SPASMS Patient not taking: No sig reported 11/04/18   Carole Civil, MD  cyclobenzaprine (FLEXERIL) 10 MG tablet TAKE 1 TABLET BY MOUTH TWICE DAILY AS NEEDED FOR MUSCLE SPASMS Patient not taking: No sig reported 01/21/19   Carole Civil, MD  diazepam (VALIUM) 5 MG tablet Take 1 tablet (5 mg total) by mouth 2 (two) times daily. Patient not taking: No sig reported 03/14/18   Isla Pence, MD  gabapentin (NEURONTIN) 100 MG capsule TAKE 1 CAPSULE BY MOUTH THREE TIMES DAILY Patient not taking: No sig reported 04/21/19   Carole Civil, MD  HYDROcodone-acetaminophen (NORCO/VICODIN) 5-325 MG tablet Take 1 tablet by mouth every 4 (four) hours as needed. Patient not taking: No sig reported 03/14/18   Isla Pence, MD  naproxen (NAPROSYN) 500 MG tablet TAKE 1 TABLET BY MOUTH TWICE DAILY WITH A MEAL Patient not taking: No sig reported 06/29/19   Carole Civil, MD  VOLTAREN 1 % GEL Apply 2 g topically 4 (four) times daily as needed (pain).  Patient not taking: Reported on 06/11/2021 02/27/18   [provider]    Current Facility-Administered Medications  Medication Dose Route Frequency Provider Last Rate Last Admin   0.45 % sodium chloride infusion  Intravenous Continuous PRN Melrose Nakayama, MD   Stopped at 06/02/2021 [phone]   0.9 %  sodium chloride infusion (Manually program via Guardrails IV Fluids)   Intra-arterial PRN Melrose Nakayama, MD       0.9 %  sodium chloride infusion (Manually program via Guardrails IV Fluids)   Intravenous PRN Melrose Nakayama, MD       0.9 %  sodium chloride infusion (Manually program via Guardrails IV Fluids)   Intravenous Once Gaye Pollack, MD       0.9 %  sodium chloride infusion   Intravenous PRN Simonne Maffucci B, MD 10 mL/hr at  06/16/21 0700 Infusion Verify at 06/16/21 0700   acetaminophen (TYLENOL) 160 MG/5ML solution 650 mg  650 mg Per Tube Q6H PRN Melrose Nakayama, MD   650 mg at 06/15/21 0855   amiodarone (NEXTERONE PREMIX) 360-4.14 MG/200ML-% (1.8 mg/mL) IV infusion  60 mg/hr Intravenous Continuous Melrose Nakayama, MD 33.3 mL/hr at 06/16/21 0700 60 mg/hr at 06/16/21 0700   artificial tears (LACRILUBE) ophthalmic ointment 1 application  1 application Both Eyes V3Z Juanito Doom, MD   1 application at 48/27/07 8675   aspirin chewable tablet 324 mg  324 mg Per Tube Daily Melrose Nakayama, MD   324 mg at 06/15/21 4492   bisacodyl (DULCOLAX) suppository 10 mg  10 mg Rectal Daily PRN Melrose Nakayama, MD   10 mg at 06/11/21 1030   ceFEPIme (MAXIPIME) 2 g in sodium chloride 0.9 % 100 mL IVPB  2 g Intravenous Q24H Melrose Nakayama, MD   Stopped at 06/15/21 1831   chlorhexidine gluconate (MEDLINE KIT) (PERIDEX) 0.12 % solution 15 mL  15 mL Mouth Rinse BID Melrose Nakayama, MD   15 mL at 06/15/21 2000   Chlorhexidine Gluconate Cloth 2 % PADS 6 each  6 each Topical Daily Melrose Nakayama, MD   6 each at 06/15/21 1542   docusate (COLACE) 50 MG/5ML liquid 200 mg  200 mg Per Tube Daily Melrose Nakayama, MD   200 mg at 06/15/21 0100   EPINEPHrine (ADRENALIN) 10 mg in sodium chloride 0.9 % 250 mL (0.04 mg/mL) infusion  0-10 mcg/min Intravenous Titrated Melrose Nakayama, MD   Stopped at 06/16/21 [id number]   feeding supplement (PIVOT 1.5 CAL) liquid 1,000 mL  1,000 mL Per Tube Continuous Simonne Maffucci B, MD 20 mL/hr at 06/15/21 1601 1,000 mL at 06/15/21 1601   heparin 50 units/mL (Impella PURGE) in dextrose 5% 1000 mL   Intracatheter Continuous Bensimhon, Shaune Pascal, MD   New Bag at 06/15/21 1136   hydrocortisone sodium succinate (SOLU-CORTEF) 100 MG injection 100 mg  100 mg Intravenous Q12H Simonne Maffucci B, MD       HYDROmorphone (DILAUDID) 200 mg in sodium chloride 0.9 % 100 mL (2 mg/mL)  infusion  0.5-8 mg/hr Intravenous Continuous Simonne Maffucci B, MD 4 mL/hr at 06/16/21 0700 8 mg/hr at 06/16/21 0700   HYDROmorphone (DILAUDID) bolus via infusion 0.5 mg  0.5 mg Intravenous Q30 min PRN Simonne Maffucci B, MD   0.5 mg at 06/16/21 0407   insulin aspart (novoLOG) injection 0-9 Units  0-9 Units Subcutaneous Q4H Einar Grad, RPH   1 Units at 06/16/21 0500   ipratropium-albuterol (DUONEB) 0.5-2.5 (3) MG/3ML nebulizer solution 3 mL  3 mL Nebulization Q4H PRN Melrose Nakayama, MD       ketamine (KETALAR) 500 mg in sodium chloride 0.9 % 100 mL (5 mg/mL) infusion  1 mg/kg/hr Intravenous Continuous Juanito Doom, MD       lactated ringers infusion   Intravenous Continuous Melrose Nakayama, MD   Stopped at 06/15/21 1137   MEDLINE mouth rinse  15 mL Mouth Rinse 10 times per day Melrose Nakayama, MD   15 mL at 06/16/21 0600   metoCLOPramide (REGLAN) injection 5 mg  5 mg Intravenous Q8H Melrose Nakayama, MD       metoprolol tartrate (LOPRESSOR) injection 2.5-5 mg  2.5-5 mg Intravenous Q2H PRN Melrose Nakayama, MD       midazolam (VERSED) 50 mg/50 mL (1 mg/mL) premix infusion  2-10 mg/hr Intravenous Continuous Simonne Maffucci B, MD 10 mL/hr at 06/16/21 0700 10 mg/hr at 06/16/21 0700   midazolam (VERSED) bolus via infusion 1-2 mg  1-2 mg Intravenous Q2H PRN Simonne Maffucci B, MD   1 mg at 06/16/21 0408   midazolam (VERSED) injection 2 mg  2 mg Intravenous Q1H PRN Melrose Nakayama, MD   2 mg at 06/15/21 0615   norepinephrine (LEVOPHED) 16 mg in 284m premix infusion  0-60 mcg/min Intravenous Titrated HMelrose Nakayama MD 9.38 mL/hr at 06/16/21 0700 10 mcg/min at 06/16/21 0700   ondansetron (ZOFRAN) injection 4 mg  4 mg Intravenous Q6H PRN HMelrose Nakayama MD       pantoprazole sodium (PROTONIX) 40 mg/20 mL oral suspension 40 mg  40 mg Per Tube Daily HMelrose Nakayama MD   40 mg at 06/15/21 0908   polyethylene glycol (MIRALAX / GLYCOLAX)  packet 17 g  17 g Oral BID HMelrose Nakayama MD   17 g at 06/15/21 2140   polyvinyl alcohol (LIQUIFILM TEARS) 1.4 % ophthalmic solution 2 drop  2 drop Both Eyes PRN HMelrose Nakayama MD   2 drop at 06/02/2021 2128   rosuvastatin (CRESTOR) tablet 40 mg  40 mg Per Tube q1800 HMelrose Nakayama MD   40 mg at 06/15/21 1819   sennosides (SENOKOT) 8.8 MG/5ML syrup 10 mL  10 mL Per Tube Daily HMelrose Nakayama MD   10 mL at 06/15/21 0907   sodium chloride flush (NS) 0.9 % injection 10-40 mL  10-40 mL Intracatheter Q12H HMelrose Nakayama MD   40 mL at 06/15/21 2150   sodium chloride flush (NS) 0.9 % injection 10-40 mL  10-40 mL Intracatheter PRN HMelrose Nakayama MD       sodium chloride flush (NS) 0.9 % injection 3 mL  3 mL Intravenous Q12H Agarwala, REinar Grad MD   10 mL at 06/15/21 2150   sodium chloride flush (NS) 0.9 % injection 3 mL  3 mL Intravenous PRN AKipp Brood MD       TPN ADULT (ION)   Intravenous Continuous TPN RKarren Cobble RPH 55 mL/hr at 06/16/21 0700 Infusion Verify at 06/16/21 0700   TPN ADULT (ION)   Intravenous Continuous TPN RKarren Cobble RPH       vasopressin (PITRESSIN) 20 Units in sodium chloride 0.9 % 100 mL infusion-*FOR SHOCK*  0-0.04 Units/min Intravenous Continuous AKipp Brood MD   Stopped at 06/15/21 1833   vecuronium (NORCURON) injection 10 mg  10 mg Intravenous Q1H PRN MJuanito Doom MD   10 mg at 06/16/21 0410    Allergies as of 06/08/2021   (No Known Allergies)    Family History  Problem Relation Age of Onset   Hypertension Mother    Prostate cancer Father    Cancer Sister    Colon  cancer Neg Hx    Colon polyps Neg Hx     Social History   Socioeconomic History   Marital status: Single    Spouse name: Not on file   Number of children: Not on file   Years of education: Not on file   Highest education level: Not on file  Occupational History   Not on file  Tobacco Use   Smoking status: Every Day     Packs/day: 0.50    Pack years: 0.00    Types: Cigarettes   Smokeless tobacco: Never  Vaping Use   Vaping Use: Never used  Substance and Sexual Activity   Alcohol use: No   Drug use: No   Sexual activity: Not on file  Other Topics Concern   Not on file  Social History Narrative   Not on file   Social Determinants of Health   Financial Resource Strain: Not on file  Food Insecurity: Not on file  Transportation Needs: Not on file  Physical Activity: Not on file  Stress: Not on file  Social Connections: Not on file  Intimate Partner Violence: Not on file    Review of Systems: Patient intubated and sedated  Physical Exam: Vital signs in last 24 hours: Temp:  [97.88 F (36.6 C)-100.94 F (38.3 C)] 97.88 F (36.6 C) (06/26 0825) Pulse Rate:  [75-102] 93 (06/26 0825) Resp:  [0-35] 35 (06/26 0825) BP: (104-154)/(73-107) 112/82 (06/26 0700) SpO2:  [92 %-100 %] 96 % (06/26 0825) Arterial Line BP: (88-160)/(61-112) 88/64 (06/26 0700) FiO2 (%):  [50 %-100 %] 50 % (06/26 0825) Weight:  [106.4 kg] 106.4 kg (06/26 0615) Last BM Date: 06/15/21 General: Lying supine sedated Head:  Normocephalic and atraumatic. Eyes:  Sclera clear, no icterus.   Conjunctiva pink. Ears:  Normal auditory acuity. Nose:  No deformity, discharge,  or lesions. Mouth:  No deformity or lesions, dentition normal. Neck:  Supple; no masses or thyromegaly. JVP not elevated Lungs: Mechanically supported breath sounds Heart:  Regular rate and rhythm; no murmurs, clicks, rubs,  or gallops. Abdomen:  Soft, nontender and nondistended. No masses, hepatosplenomegaly or hernias noted. Normal bowel sounds, without guarding, and without rebound.   Msk:  Symmetrical without gross deformities. Normal posture. Pulses:  No carotid, renal, femoral bruits. DP and PT symmetrical and equal Extremities:  Without clubbing or edema. Neurologic:   Sedated on vent Skin:  Intact without significant lesions or rashes.     Intake/Output from previous day: 06/25 0701 - 06/26 0700 In: 4402 [I.V.:2862; Blood:210; NG/GT:479.7; IV Piggyback:489.8] Out: 5329 [Urine:495; Emesis/NG output:500; Chest Tube:270] Intake/Output this shift: No intake/output data recorded.  Lab Results: Recent Labs    06/14/21 0326 06/15/21 0440 06/15/21 0639 06/15/21 1441 06/16/21 0453 06/16/21 0501  WBC 24.2* 29.5*  --   --  30.6*  --   HGB 7.6* 7.0*   < > 8.8* 7.1* 7.8*  HCT 22.3* 20.3*   < > 26.0* 21.4* 23.0*  PLT 64* 90*  --   --  85*  --    < > = values in this interval not displayed.   BMET Recent Labs    06/14/21 0326 06/14/21 1648 06/15/21 0440 06/15/21 0639 06/15/21 1620 06/16/21 0453 06/16/21 0501  NA 130*   < > 130*   < > 132* 128* 129*  K 4.2   < > 3.8   < > 4.2 4.1 4.1  CL 100   < > 98  --  98 96*  --  CO2 19*   < > 20*  --  21* 17*  --   GLUCOSE 138*   < > 157*  --  138* 137*  --   BUN 29*   < > 38*  --  46* 56*  --   CREATININE 3.27*   < > 4.07*  --  4.52* 5.34*  --   CALCIUM 7.4*   < > 7.7*  --  7.8* 7.7*  --   PHOS 5.8*  --  5.4*  --   --   --   --    < > = values in this interval not displayed.   LFT Recent Labs    06/14/21 0326 06/15/21 0440 06/16/21 0453  PROT 4.6*   < > 4.9*  ALBUMIN 2.0*   < > 2.4*  AST 59*   < > 100*  ALT 6   < > 9  ALKPHOS 75   < > 65  BILITOT 1.7*   < > 4.4*  BILIDIR 0.9*  --   --   IBILI 0.8  --   --    < > = values in this interval not displayed.   PT/INR Recent Labs    06/15/21 0440 06/16/21 0453  LABPROT 17.5* 17.0*  INR 1.4* 1.4*   Hepatitis Panel No results for input(s): HEPBSAG, HCVAB, HEPAIGM, HEPBIGM in the last 72 hours.  Studies/Results: DG CHEST PORT 1 VIEW  Result Date: 06/15/2021 CLINICAL DATA:  Left central line placement. EXAM: PORTABLE CHEST 1 VIEW COMPARISON:  06/15/2021 at 5:42 a.m. FINDINGS: New left internal jugular central venous line has its tip in the lower superior vena cava, well positioned. No pneumothorax. No change in  the previously described lines and tubes. No change in the bilateral airspace lung opacities. IMPRESSION: 1. New left internal jugular central venous catheter, tip lying in the lower superior vena cava. No pneumothorax. 2. No other change from the earlier study Electronically Signed   By: Lajean Manes M.D.   On: 06/15/2021 11:11   DG Chest Port 1 View  Result Date: 06/15/2021 CLINICAL DATA:  60 year old woman with hx of HTN, HLD, hypothyroidism, CVA, DM2 who presented with angina. Underwent cardiac cath which showed left main dissection, during this procedure became acutely unstable with crushing chest pain and shock. Taken emergently to OR for bypass after 3-5 impella placed. Tough to wean from bypass so left centrally cannulated on VA ECMO and sent to [facility]. EXAM: PORTABLE CHEST 1 VIEW COMPARISON:  06/14/2021 and older exams. FINDINGS: There has been interval worsening with an increase in bilateral airspace lung opacities. No convincing pleural effusion.  No pneumothorax. Endotracheal tube tip lies at the medial clavicular heads, currently projecting 3.5 cm above the carina. Nasal/orogastric tube, feeding tube, right internal jugular central venous line, mediastinal tube and left chest tube are stable. No mediastinal widening. IMPRESSION: 1. Interval worsening of lung aeration with increased bilateral airspace lung opacities consistent with pulmonary edema. 2. Stable support apparatus. Electronically Signed   By: Lajean Manes M.D.   On: 06/15/2021 07:55   DG Abd Portable 1V  Result Date: 06/14/2021 CLINICAL DATA:  Tube placement. EXAM: PORTABLE ABDOMEN - 1 VIEW COMPARISON:  Radiograph earlier this day. FINDINGS: Tip of the weighted enteric tube is in the right upper quadrant of the abdomen likely in the proximal duodenum. There is a second enteric tube in place with tip in the stomach. Overlying drains as well as monitoring devices. IMPRESSION: Tip of the weighted enteric  tube in the right upper quadrant of  the abdomen likely in the proximal duodenum. Second enteric tube with tip in the stomach. Electronically Signed   By: Keith Rake M.D.   On: 06/14/2021 19:49    Assessment/Plan: Acute kidney injury.  Appears to have had progressive decline in renal function.  She has maintained urine output.  And is nonoliguric at this point.  I think it is reasonable to initiate continuous renal replacement therapy.  She continues on ECMO.  She is status post Impella.  We will send urine and check renal ultrasound.  Continue to avoid nephrotoxins. ANEMIA-transfuse as per critical care Metabolic acidosis should correct with CRRT HTN/VOL-we will maintain even at this point.  May need to consider ultrafiltration.  She continues on IV Lasix Hypothyroidism on replacement therapy Diabetes mellitus as per primary service   LOS: [place] '@TODAY' '@8' :54 AM

## 2021-06-16 NOTE — Progress Notes (Signed)
Patient ID: [name] [name], female   DOB: 09-23-1961, 60 y.o.   MRN: [id number] [facility] Evening Rounds:  Hemodynamics relatively stable since CRRT started today. NE up from 10 this am to 18.   Removing 100cc/hr with CRRT.  CVP 15-16  Lactate 4.2 this afternoon. ABG this am showed BE -10. Question if this is just kidney failure vs ischemic gut.

## 2021-06-16 NOTE — Progress Notes (Signed)
NAME:  [name] [name], MRN:  [id number], DOB:  01-Jun-1961, LOS: 56 ADMISSION DATE:  06/09/2021, CONSULTATION DATE:  6/15 REFERRING MD:  Roxan Hockey, CHIEF COMPLAINT:  Chest pain   History of Present Illness:  60 year old woman with hx of HTN, HLD, hypothyroidism, CVA, DM2 who presented with angina.  Underwent cardiac cath which showed left main dissection, during this procedure became acutely unstable with crushing chest pain and shock.  Taken emergently to OR for bypass after 3-5 impella placed.  Tough to wean from bypass so left centrally cannulated on VA ECMO and sent to [place].  PCCM consulted to help with management.  Had VA ECMO through 6/20; Sternal wound debridement on 6/22.  Pertinent  Medical History  DM2 Hypertension WPW CAD Multiple brain aneursym  Significant Hospital Events: Including procedures, antibiotic start and stop dates in addition to other pertinent events   6/14 admitted 6/15 spontaneous artery dissection left main on cath.CP Impella placed.   6/15 emergent coronary artery bypass graft, LIMA to LAD, SVG to OM.  Brief cardiac arrest of less than 10 minutes immediately prior to initiation of cardiopulmonary bypass. 6/15 unable to wean.  Still unable to wean from bypass so placed on centrally cannulated VA ECMO. 6/16 EEG shows encephalopathy. 6/16 Cold right hand - seen by vascular, conservative management 6/17 Hand improved. 774mL clot removed with washout at the bedside 6/19 RUL collapse - underwent bronchoscopy and aspiration of RUL mucus plug. 6/20 ECMO Decannulation 6/21>> BIS monitoring started , weaning of sedation, wet CXR 6/21 vanc > 6/26 6/21 cefepime >  6/22 bedside chest washout; started on aggressive laxatives for severe constipation. 6/23 ongoing hemodynamic instability complicated by ectopy. 6/24 tolerating fluid removal with furosemide infusion.,  Oxygenation improving, slight decrease in vasopressor requirements 6/26 decreased pressor requirements,  start CRRT  Interim History / Subjective:  Decreased vasopressor requirements Low grade fever overnight Oliguric More hypoxemic Metabolic acidosis No bowel sounds  Objective   Blood pressure 112/82, pulse 93, temperature 98.06 F (36.7 C), resp. rate (!) 35, height 5\' 3"  (1.6 m), weight 106.4 kg, SpO2 96 %. CVP:  [20 mmHg-25 mmHg] 20 mmHg  Vent Mode: PRVC FiO2 (%):  [50 %-100 %] 50 % Set Rate:  [22 bmp-35 bmp] 35 bmp Vt Set:  [320 mL-410 mL] 320 mL PEEP:  [10 cmH20-16 cmH20] 14 cmH20 Plateau Pressure:  [27 cmH20-33 cmH20] 28 cmH20   Intake/Output Summary (Last 24 hours) at 06/16/2021 0800 Last data filed at 06/16/2021 0700 Gross per 24 hour  Intake 4228.23 ml  Output 1090 ml  Net 3138.23 ml   Filed Weights   06/14/21 0500 06/15/21 0615 06/16/21 0615  Weight: 99.3 kg 102.7 kg 106.4 kg    Examination:  General:  In bed on vent HENT: NCAT ETT in place PULM: CTA B, vent supported breathing; chest open, dressing in place CV: RRR, no mgr GI: no bowel sounds, soft MSK: normal bulk and tone Derm: cap refill intact all four ext Neuro: sedated on vent   Labs/imaging that I have personally reviewed  (right click and "Reselect all SmartList Selections" daily)  ABG: 7.29/43/60 Cr worse Cr 4.1 AST slightly elevated T Bili slightly up LDH high WBC 29.5k PLT 90 INR 1.4  6/21 blood culture > negative  6/26 CXR > images personally reviewed, ETT in place, diffuse bilateral air space disease, L IJ CVL in place  Resolved Hospital Problem list   RUL collapse   Assessment & Plan:  Cardiogenic shock after spontaneous coronary  dissection of left main, requiring emergent CABG then ECMO; ECMO catheter removed 6/20, now on Impella Open chest Impella per cardiology Wean off levophed for MAP > 65 Amiodarone per cardiology Maintain chest dressing, chest closure per TCTS Wean off hydrocortisone  Constipation At risk for malnutrition Continue TPN Bowel regimen today If bowel  movement, then start trickle feeds Add ketamine to facilitate decreasing dilaudid rate  Platelet dysfunction Normocytic anemia Monitor for bleeding Transfuse PRBC for Hgb < 7 gm/dL  Sepsis/fever> cultures negative, possible HCAP; lines? CVL new as of 6/26 Leukocytosis likely due to hydrocortisone as hemodynamics are improving Monitor blood cultures Repeat blood cultures 6/27 if still febrile Continue HCAP coverage Continue cefepime Stop vanc  Acute respiratory failure with hypoxemia ARDS Continue mechanical ventilation per ARDS protocol Target TVol 6-8cc/kgIBW Target Plateau Pressure < 30cm H20 Target driving pressure less than 15 cm of water Target PaO2 55-65: titrate PEEP/FiO2 per protocol No prone positioning with open chest Volume removal target determined by hemodynamics Ventilator associated pneumonia prevention protocol  Shock liver> minimal LFT prn  AKI, now oliguric Stop lasix infusion Start CRRT, family explained risks and benefits and consents to proceed Monitor BMET and UOP Replace electrolytes as needed  DM2 with hyperglycemia SSI q4h  Need for sedation for mechanical ventilation Ventilator dyssynchrony Neuromuscular blockade protocol with prn vecuronium Dilaudid/versed per protocol Add ketamine to decrease dilaudid needs  Best Practice (right click and "Reselect all SmartList Selections" daily)   Diet/type: tubefeeds Pain/Anxiety/Delirium protocol RASS goal -4 to -5 VAP protocol (if indicated): Yes DVT prophylaxis: SCD GI prophylaxis: PPI Glucose control:  SSI Central venous access:  Yes, and it is still needed Arterial line:  Yes, and it is still needed Foley:  Yes, and it is still needed Mobility:  bed rest  PT consulted: N/A Code Status:  limited Last date of multidisciplinary goals of care discussion [per primary] ccm prognosis: Life-threating and Serious Disposition: remains critically ill, will stay in intensive care   Labs    CBC: Recent Labs  Lab 06/06/2021 0359 06/16/2021 1720 06/14/21 0322 06/14/21 0326 06/15/21 0440 06/15/21 0639 06/15/21 1301 06/15/21 1441 06/16/21 0453 06/16/21 0501  WBC 17.2* 19.5*  --  24.2* 29.5*  --   --   --  30.6*  --   NEUTROABS  --   --   --   --  23.8*  --   --   --  21.4*  --   HGB 7.7* 7.7*   < > 7.6* 7.0* 7.1* 8.8* 8.8* 7.1* 7.8*  HCT 23.0* 23.3*   < > 22.3* 20.3* 21.0* 26.0* 26.0* 21.4* 23.0*  MCV 89.8 89.6  --  88.1 86.8  --   --   --  91.5  --   PLT 49* 50*  --  64* 90*  --   --   --  85*  --    < > = values in this interval not displayed.    Basic Metabolic Panel: Recent Labs  Lab 06/11/21 0331 06/11/21 0542 06/18/2021 0330 06/18/2021 0459 06/14/21 0326 06/14/21 1648 06/15/21 0440 06/15/21 0639 06/15/21 1301 06/15/21 1441 06/15/21 1620 06/16/21 0453 06/16/21 0501  NA 136   < > 133*   < > 130* 131* 130*   < > 129* 129* 132* 128* 129*  K 4.5   < > 4.3   < > 4.2 4.2 3.8   < > 4.5 4.3 4.2 4.1 4.1  CL 108   < > 104   < > 100  99 98  --   --   --  98 96*  --   CO2 21*   < > 20*   < > 19* 21* 20*  --   --   --  21* 17*  --   GLUCOSE 102*   < > 141*   < > 138* 142* 157*  --   --   --  138* 137*  --   BUN 16   < > 20   < > 29* 31* 38*  --   --   --  46* 56*  --   CREATININE 1.88*   < > 2.70*   < > 3.27* 3.70* 4.07*  --   --   --  4.52* 5.34*  --   CALCIUM 8.1*   < > 7.8*   < > 7.4* 7.6* 7.7*  --   --   --  7.8* 7.7*  --   MG 1.7  --  2.2  --  2.8*  --  2.7*  --   --   --   --   --   --   PHOS  --   --   --   --  5.8*  --  5.4*  --   --   --   --   --   --    < > = values in this interval not displayed.   GFR: Estimated Creatinine Clearance: 13.1 mL/min (A) (by C-G formula based on SCr of 5.34 mg/dL (H)). Recent Labs  Lab 05/24/2021 0323 05/31/2021 0921 06/16/2021 1649 06/08/2021 1720 06/14/21 0326 06/15/21 0440 06/16/21 0453  WBC  --  9.2   < > 19.5* 24.2* 29.5* 30.6*  LATICACIDVEN 0.7 0.9  --   --   --   --   --    < > = values in this interval not displayed.     Liver Function Tests: Recent Labs  Lab 06/09/2021 0330 05/28/2021 0359 06/14/21 0326 06/15/21 0440 06/16/21 0453  AST 75* 59* 59* 72* 100*  ALT 6 6 6 7 9   ALKPHOS 65 67 75 76 65  BILITOT 1.0 1.3* 1.7* 2.1* 4.4*  PROT 4.8* 4.5* 4.6* 5.0* 4.9*  ALBUMIN 2.4* 2.0* 2.0* 1.9* 2.4*   No results for input(s): LIPASE, AMYLASE in the last 168 hours. No results for input(s): AMMONIA in the last 168 hours.  ABG    Component Value Date/Time   PHART 7.170 (LL) 06/16/2021 0501   PCO2ART 49.1 (H) 06/16/2021 0501   PO2ART 71 (L) 06/16/2021 0501   HCO3 17.9 (L) 06/16/2021 0501   TCO2 19 (L) 06/16/2021 0501   ACIDBASEDEF 10.0 (H) 06/16/2021 0501   O2SAT 89.0 06/16/2021 0501     Coagulation Profile: Recent Labs  Lab 06/01/2021 0330 05/31/2021 0359 06/14/21 0326 06/15/21 0440 06/16/21 0453  INR 1.5* 1.6* 1.6* 1.4* 1.4*    Cardiac Enzymes: No results for input(s): CKTOTAL, CKMB, CKMBINDEX, TROPONINI in the last 168 hours.  HbA1C: Hgb A1c MFr Bld  Date/Time Value Ref Range Status  06/06/2021 10:03 AM 5.4 4.8 - 5.6 % Final    Comment:    (NOTE)         Prediabetes: 5.7 - 6.4         Diabetes: >6.4         Glycemic control for adults with diabetes: <7.0     CBG: Recent Labs  Lab 06/15/21 1530 06/15/21 1959 06/16/21 0002 06/16/21 0425 06/16/21 0742  GLUCAP 140* 217*  193* 135* 134*    Critical care time: 40 minutes     Roselie Awkward, MD [facility] Pager: [phone] Cell: [phone] If no response, please call [phone] until 7pm After 7:00 pm call Elink  [phone]

## 2021-06-16 NOTE — Progress Notes (Signed)
PHARMACY - TOTAL PARENTERAL NUTRITION CONSULT NOTE   Indication:  intolerance to enteral feeding  Patient Measurements: Height: '5\' 3"'  (160 cm) Weight: 106.4 kg (234 lb 9.1 oz) IBW/kg (Calculated) : 52.4 TPN AdjBW (KG): 61.1 Body mass index is 41.55 kg/m. Usual Weight:   Assessment: 60 yo female admitted w/ chest pain/STEMI  and spontaneous dissection of LM requiring emergent CABG x 2. Now w/ cardiogenic shock s/p ECMO (decannulated 6/20), requiring Impella and vent support now ARDS, MSOF, CVVHD 6/26>>  , sepsis -PMH: DM, HTN, HLD, afib, CVA, h/o Von Willebrands deficiency  Patient was started on tube feeds 6/16, however patient has not been tolerating well and tube feeds had to be stopped on 6/23.  Cortrak was clamped on 6/23 due to dark brown emesis.    Concentrating TPN as much as possible to limit fluid intake given heart failure and worsened renal function.    Glucose / Insulin: hx DM, CBGs 135-217, on Sensitive Novolog SSI q4h= 10 units last 24h. - hydrocortisone 50 mg IV q6h  Electrolytes: Na 129, K 4.1, Cl 96, Bicarb 17 lower, Ionized Ca 1.1 low,, Phos 5.4, Mg 2.7 with noted worsening metabolic acidosis.  Renal: AKI 2/2 shock/ATN - Scr 5.34 rising, BUN 56  Hepatic: AST 100 rising, ALT wnl, alk phos wnl, Tbili 2.1>>4.4 up, LDH 1414 up, TG 65  GI meds: Docusate 268m/d, tube PPI/d, Miralax BID, Senna syr daily,   Intake / Output; MIVF: UOP 495 mL down,  KVO fluids - LBM 6/25 recorded stool from rectal tube - Chest tube: 270 - NG 500  GI Imaging: 6/24 XR abdomen: no acute abnormality  GI Surgeries / Procedures:  N/a   Central access: placing consult for access 6/24 TPN start date: 6/24  Nutritional Goals (per RD recommendation on 6/23, 6/24): kCal: 2100-2300, Protein: 125-140g, Fluid: >=1.9 L Goal TPN rate is 75 mL/hr (provides 140 g of protein and 2200 kcals per day)  Current Nutrition:  NPO and TPN- to start 6/24 - 6/25: Prealbumin only 5.5 Trial of trickle  TF as able. Pivot 1.5 at 20 ml/hr, goal rate of 60 ml/hr  Plan:  Start CRRT 6/26 Trickle TF challenge this weekend at 267mhr only. Increase? Increase TPN to 7525mr. (1.8L fluid) will provide 126g protein, 2131 kcal, 64.8g lipids, 288g dextrose. Concentrate TPN to limit fluids (using Clinisol 15%)  Electrolytes in TPN:Con't  Na 32m60m, K 0mEq43m Ca 8mEq/2mMg 0mEq/L44mnd Phos 0mmol/L23max acetate - Can replace K, Mg, and Phos PRN as filtered by CRRT Will con't to assess need to add long-acting insulin with increased TPN rate Add standard MVI and trace elements to TPN Monitor TPN labs on Mon/Thurs and PRN   Corra Kaine S. RobertsoAlford Highland, BCPS Clinical Staff Pharmacist [email or web address] 06/16/2021,7:32 AM

## 2021-06-16 NOTE — Progress Notes (Signed)
Advanced Heart Failure Rounding Note   Subjective:    6/15 - LM coronary scad with emergent CABG  -> ECMO 6/17 - chest washout at bedside 6/19 - bronch due to RUL mucous plugging  6/20 - ECMO decannulation. Intra-op TEE EF ~35% 6/21 - Repeat washout 6/21 - DCCV   Decannulated from ECMO 6/20 Chest remains open. Sedated on vent.   Remains on Impella now on to  P-7 Flow 3.1 Waveforms ok. No alarms. LDH climbing  Vent down to 50% CXR diffuse bilateral infiltrates. No change  Vasopressor requirement down. Off EPI and VP. Co-ox 66%  ABG 7.17/49/71/89%    Objective:   Weight Range:  Vital Signs:   Temp:  [97.88 F (36.6 C)-100.94 F (38.3 C)] 97.88 F (36.6 C) (06/26 0825) Pulse Rate:  [75-102] 93 (06/26 0825) Resp:  [0-35] 35 (06/26 0825) BP: (104-154)/(68-107) 112/82 (06/26 0700) SpO2:  [92 %-100 %] 96 % (06/26 0825) Arterial Line BP: (88-160)/(61-112) 88/64 (06/26 0700) FiO2 (%):  [50 %-100 %] 50 % (06/26 0825) Weight:  [106.4 kg] 106.4 kg (06/26 0615) Last BM Date: 06/15/21  Weight change: Filed Weights   06/14/21 0500 06/15/21 0615 06/16/21 0615  Weight: 99.3 kg 102.7 kg 106.4 kg    Intake/Output:   Intake/Output Summary (Last 24 hours) at 06/16/2021 0830 Last data filed at 06/16/2021 0700 Gross per 24 hour  Intake 4228.23 ml  Output 1090 ml  Net 3138.23 ml      Physical Exam: General: Sedated on vent. Chest open  HEENT: normal + ETT Neck: supple. LIJ triple lumen Carotids 2+ bilat; no bruits. No lymphadenopathy or thryomegaly appreciated. Cor: Chest open RRR [place] ok  Lungs: coarse Abdomen: soft, nontender, nondistended. Hypoactive BS Extremities: no cyanosis, clubbing, rash, 2+ edema  RFA impella Neuro: Sedated on vent  Telemetry: NSR with PACs 90-105 Personally reviewed   Labs: Basic Metabolic Panel: Recent Labs  Lab 06/11/21 0331 06/11/21 0542 05/23/2021 0330 06/06/2021 0459 06/14/21 0326 06/14/21 1648 06/15/21 0440 06/15/21 0639  06/15/21 1301 06/15/21 1441 06/15/21 1620 06/16/21 0453 06/16/21 0501  NA 136   < > 133*   < > 130* 131* 130*   < > 129* 129* 132* 128* 129*  K 4.5   < > 4.3   < > 4.2 4.2 3.8   < > 4.5 4.3 4.2 4.1 4.1  CL 108   < > 104   < > 100 99 98  --   --   --  98 96*  --   CO2 21*   < > 20*   < > 19* 21* 20*  --   --   --  21* 17*  --   GLUCOSE 102*   < > 141*   < > 138* 142* 157*  --   --   --  138* 137*  --   BUN 16   < > 20   < > 29* 31* 38*  --   --   --  46* 56*  --   CREATININE 1.88*   < > 2.70*   < > 3.27* 3.70* 4.07*  --   --   --  4.52* 5.34*  --   CALCIUM 8.1*   < > 7.8*   < > 7.4* 7.6* 7.7*  --   --   --  7.8* 7.7*  --   MG 1.7  --  2.2  --  2.8*  --  2.7*  --   --   --   --   --   --  PHOS  --   --   --   --  5.8*  --  5.4*  --   --   --   --   --   --    < > = values in this interval not displayed.     Liver Function Tests: Recent Labs  Lab 06/15/2021 0330 05/24/2021 0359 06/14/21 0326 06/15/21 0440 06/16/21 0453  AST 75* 59* 59* 72* 100*  ALT '6 6 6 7 9  ' ALKPHOS 65 67 75 76 65  BILITOT 1.0 1.3* 1.7* 2.1* 4.4*  PROT 4.8* 4.5* 4.6* 5.0* 4.9*  ALBUMIN 2.4* 2.0* 2.0* 1.9* 2.4*    No results for input(s): LIPASE, AMYLASE in the last 168 hours. No results for input(s): AMMONIA in the last 168 hours.  CBC: Recent Labs  Lab 06/11/2021 0359 06/06/2021 1720 06/14/21 0322 06/14/21 0326 06/15/21 0440 06/15/21 0639 06/15/21 1301 06/15/21 1441 06/16/21 0453 06/16/21 0501  WBC 17.2* 19.5*  --  24.2* 29.5*  --   --   --  30.6*  --   NEUTROABS  --   --   --   --  23.8*  --   --   --  21.4*  --   HGB 7.7* 7.7*   < > 7.6* 7.0* 7.1* 8.8* 8.8* 7.1* 7.8*  HCT 23.0* 23.3*   < > 22.3* 20.3* 21.0* 26.0* 26.0* 21.4* 23.0*  MCV 89.8 89.6  --  88.1 86.8  --   --   --  91.5  --   PLT 49* 50*  --  64* 90*  --   --   --  85*  --    < > = values in this interval not displayed.     Cardiac Enzymes: No results for input(s): CKTOTAL, CKMB, CKMBINDEX, TROPONINI in the last 168  hours.  BNP: BNP (last 3 results) No results for input(s): BNP in the last 8760 hours.  ProBNP (last 3 results) No results for input(s): PROBNP in the last 8760 hours.    Other results:  Imaging: DG CHEST PORT 1 VIEW  Result Date: 06/15/2021 CLINICAL DATA:  Left central line placement. EXAM: PORTABLE CHEST 1 VIEW COMPARISON:  06/15/2021 at 5:42 a.m. FINDINGS: New left internal jugular central venous line has its tip in the lower superior vena cava, well positioned. No pneumothorax. No change in the previously described lines and tubes. No change in the bilateral airspace lung opacities. IMPRESSION: 1. New left internal jugular central venous catheter, tip lying in the lower superior vena cava. No pneumothorax. 2. No other change from the earlier study Electronically Signed   By: Lajean Manes M.D.   On: 06/15/2021 11:11   DG Chest Port 1 View  Result Date: 06/15/2021 CLINICAL DATA:  60 year old woman with hx of HTN, HLD, hypothyroidism, CVA, DM2 who presented with angina. Underwent cardiac cath which showed left main dissection, during this procedure became acutely unstable with crushing chest pain and shock. Taken emergently to OR for bypass after 3-5 impella placed. Tough to wean from bypass so left centrally cannulated on VA ECMO and sent to [place]. EXAM: PORTABLE CHEST 1 VIEW COMPARISON:  06/14/2021 and older exams. FINDINGS: There has been interval worsening with an increase in bilateral airspace lung opacities. No convincing pleural effusion.  No pneumothorax. Endotracheal tube tip lies at the medial clavicular heads, currently projecting 3.5 cm above the carina. Nasal/orogastric tube, feeding tube, right internal jugular central venous line, mediastinal tube and left chest tube are  stable. No mediastinal widening. IMPRESSION: 1. Interval worsening of lung aeration with increased bilateral airspace lung opacities consistent with pulmonary edema. 2. Stable support apparatus. Electronically  Signed   By: Lajean Manes M.D.   On: 06/15/2021 07:55   DG Abd Portable 1V  Result Date: 06/14/2021 CLINICAL DATA:  Tube placement. EXAM: PORTABLE ABDOMEN - 1 VIEW COMPARISON:  Radiograph earlier this day. FINDINGS: Tip of the weighted enteric tube is in the right upper quadrant of the abdomen likely in the proximal duodenum. There is a second enteric tube in place with tip in the stomach. Overlying drains as well as monitoring devices. IMPRESSION: Tip of the weighted enteric tube in the right upper quadrant of the abdomen likely in the proximal duodenum. Second enteric tube with tip in the stomach. Electronically Signed   By: Keith Rake M.D.   On: 06/14/2021 19:49     Medications:     Scheduled Medications:  sodium chloride   Intravenous Once   artificial tears  1 application Both Eyes V7C   aspirin  324 mg Per Tube Daily   chlorhexidine gluconate (MEDLINE KIT)  15 mL Mouth Rinse BID   Chlorhexidine Gluconate Cloth  6 each Topical Daily   docusate  200 mg Per Tube Daily   hydrocortisone sod succinate (SOLU-CORTEF) inj  100 mg Intravenous Q12H   insulin aspart  0-9 Units Subcutaneous Q4H   mouth rinse  15 mL Mouth Rinse 10 times per day   pantoprazole sodium  40 mg Per Tube Daily   polyethylene glycol  17 g Oral BID   rosuvastatin  40 mg Per Tube q1800   sennosides  10 mL Per Tube Daily   sodium chloride flush  10-40 mL Intracatheter Q12H   sodium chloride flush  3 mL Intravenous Q12H    Infusions:  sodium chloride Stopped (06/08/2021 0918)   sodium chloride 10 mL/hr at 06/16/21 0700   amiodarone 60 mg/hr (06/16/21 0700)   ceFEPime (MAXIPIME) IV Stopped (06/15/21 1831)   epinephrine Stopped (06/16/21 5885)   feeding supplement (PIVOT 1.5 CAL) 1,000 mL (06/15/21 1601)   heparin 50 units/mL (Impella PURGE) in dextrose 5 % 1000 mL bag     HYDROmorphone 8 mg/hr (06/16/21 0700)   ketamine (KETALAR) Adult IV Infusion     lactated ringers Stopped (06/15/21 1137)   midazolam 10  mg/hr (06/16/21 0700)   norepinephrine (LEVOPHED) Adult infusion 10 mcg/min (06/16/21 0700)   TPN ADULT (ION) 55 mL/hr at 06/16/21 0700   TPN ADULT (ION)     vasopressin Stopped (06/15/21 1833)    PRN Medications: sodium chloride, sodium chloride, sodium chloride, sodium chloride, acetaminophen (TYLENOL) oral liquid 160 mg/5 mL, bisacodyl, HYDROmorphone, ipratropium-albuterol, metoprolol tartrate, midazolam, midazolam, ondansetron (ZOFRAN) IV, polyvinyl alcohol, sodium chloride flush, sodium chloride flush, vecuronium   Assessment/Plan   Cardiogenic shock in setting of left main dissection and emergent CABG - chest open on V-A ECMO with Impella vent. S/p chest washout 6/17 - TEE 9/19 EF 35-40% - ECMO decannulated 6/20  - Impella at P-7  Flow 3.1 Waveforms ok. LDH rising - Vasopressor requirement down. Off EPI and VP. Co-ox 66% but still acidotic with rising LDH. Check lactate - Discussed possible upgrade to Impella 5.5 with TCTS and family. Family does not want this. Now flowing better on Impella - Not diuresing well on IV lasix. Creatinine up to 5. Weight up 40 pounds.  - On ARDS protocol per CCM - Plan CVVHD today - LDH way up on impella.  769 -> 1242 -> 1414. Check lactate  2. CAD with acute due to left main dissection - s/p emergent CABG 06/09/2021 - continue ASA/statin  3. Acute respiratory failure due to above -> ARDS - On vent. Attempting to diurese as tolerated - CCM managing vent - Have switched to ARDS protocol  4 AKI - due to shock/ATN - SCr baseline 1.7 -> 2.7 -> 2.8 -. 3.2 -> 4.1-> 5.3 - continue support. Keep MAPs > 70 - start CVVHD  5. Hypomag/hypokalemia - supp as needed  6. Anemia due to expected post-operative blood loss and ECMO circuit - transfuse to keep hgb >= 8.0.  7. Thrombocytopenia - due to critical illness. PLTs 45k -> 32k -> 64k -> 90k -> 85k - has h/o Von Willebrands deficiency - has received low-dose DDAVP - likely due to critical  illness/Impella - HIT negative  8. Sepsis - developed high fevers on 6/21. Vanc/cefepime added - WBC 19.5 -> 24.2k -> 29.5k -> 30.6k - Cx remain NGTD - Vanc stopped today by CCM  9. Atrial fibrillation, paroxysmal - back in NSR after DCCV 6/21 - continue amio   10. Post-operative ileus - had BM 6/23. Flexi-seal in place - getting trickle feeds va Cor-trak but now with ileus and TFs coming back up. Will stop   Prognosis very tenuous. Start CVVHD today. Need fluid off prior to attempt at chest closure   CRITICAL CARE Performed by: Glori Bickers  Total critical care time: 45 minutes  Critical care time was exclusive of separately billable procedures and treating other patients.  Critical care was necessary to treat or prevent imminent or life-threatening deterioration.  Critical care was time spent personally by me (independent of midlevel providers or residents) on the following activities: development of treatment plan with patient and/or surrogate as well as nursing, discussions with consultants, evaluation of patient's response to treatment, examination of patient, obtaining history from patient or surrogate, ordering and performing treatments and interventions, ordering and review of laboratory studies, ordering and review of radiographic studies, pulse oximetry and re-evaluation of patient's condition.    Length of Stay: 11   Glori Bickers MD 06/16/2021, 8:30 AM  Advanced Heart Failure Team Pager [phone] (M-F; [place])  Please contact [facility] for night-coverage after hours (4p -7a ) and weekends on [email or web address]

## 2021-06-16 NOTE — Procedures (Signed)
Central Venous Catheter Insertion Procedure Note  [name] [name]  [id number]  [birth year]  Date:06/16/21  Time:9:37 AM   Provider Performing:Brent Leyda Vanderwerf   Procedure: Insertion of Non-tunneled Central Venous Catheter(36556)with US guidance (72820)    Indication(s) Medication administration  Consent Risks of the procedure as well as the alternatives and risks of each were explained to the patient and/or caregiver.  Consent for the procedure was obtained and is signed in the bedside chart  Anesthesia Topical only with 1% lidocaine   Timeout Verified patient identification, verified procedure, site/side was marked, verified correct patient position, special equipment/implants available, medications/allergies/relevant history reviewed, required imaging and test results available.  Sterile Technique Maximal sterile technique including full sterile barrier drape, hand hygiene, sterile gown, sterile gloves, mask, hair covering, sterile ultrasound probe cover (if used).  Procedure Description Area of catheter insertion was cleaned with chlorhexidine and draped in sterile fashion.   With real-time ultrasound guidance a HD catheter was placed into the right internal jugular vein.  Nonpulsatile blood flow and easy flushing noted in all ports.  The catheter was sutured in place and sterile dressing applied.  Complications/Tolerance None; patient tolerated the procedure well. Chest X-ray is ordered to verify placement for internal jugular or subclavian cannulation.  Chest x-ray is not ordered for femoral cannulation.  EBL Minimal  Specimen(s) None  Roselie Awkward, MD [facility] Pager: [phone] Cell: [phone] If no response, please call [phone] until 7pm After 7:00 pm call Elink  [phone]

## 2021-06-16 NOTE — Progress Notes (Signed)
4 Days Post-Op Procedure(s) (LRB): STERNAL WOUND DEBRIDEMENT (N/A) Subjective:  Has been relatively stable with intermittent paralysis.  MAP 70-80's on NE 10. Off epi and vaso.  Impella P7. Co-ox 65.6 with arterial sat 89.  CVP 20  Oliguric on lasix 20 and starting CRRT  Objective: Vital signs in last 24 hours: Temp:  [97.88 F (36.6 C)-100.76 F (38.2 C)] 97.88 F (36.6 C) (06/26 0825) Pulse Rate:  [93-102] 93 (06/26 0825) Cardiac Rhythm: Normal sinus rhythm (06/26 0000) Resp:  [0-35] 35 (06/26 0825) BP: (104-154)/(75-107) 112/82 (06/26 0700) SpO2:  [92 %-100 %] 96 % (06/26 0825) Arterial Line BP: (88-160)/(64-112) 88/64 (06/26 0700) FiO2 (%):  [50 %-100 %] 50 % (06/26 0825) Weight:  [106.4 kg] 106.4 kg (06/26 0615)  Hemodynamic parameters for last 24 hours: CVP:  [20 mmHg-25 mmHg] 20 mmHg  Intake/Output from previous day: 06/25 0701 - 06/26 0700 In: 4402 [I.V.:2862; Blood:210; NG/GT:479.7; IV Piggyback:489.8] Out: 1062 [Urine:495; Emesis/NG output:500; Chest Tube:270] Intake/Output this shift: No intake/output data recorded.  Heart: regular rate and rhythm, S1, S2 normal, no murmur Lungs: clear to auscultation bilaterally Abdomen: soft, no BS Extremities: anasarca Wound: Esmark dressing intact over open sternum  Lab Results: Recent Labs    06/15/21 0440 06/15/21 0639 06/16/21 0453 06/16/21 0501  WBC 29.5*  --  30.6*  --   HGB 7.0*   < > 7.1* 7.8*  HCT 20.3*   < > 21.4* 23.0*  PLT 90*  --  85*  --    < > = values in this interval not displayed.   BMET:  Recent Labs    06/15/21 1620 06/16/21 0453 06/16/21 0501  NA 132* 128* 129*  K 4.2 4.1 4.1  CL 98 96*  --   CO2 21* 17*  --   GLUCOSE 138* 137*  --   BUN 46* 56*  --   CREATININE 4.52* 5.34*  --   CALCIUM 7.8* 7.7*  --     PT/INR:  Recent Labs    06/16/21 0453  LABPROT 17.0*  INR 1.4*   ABG    Component Value Date/Time   PHART 7.170 (LL) 06/16/2021 0501   HCO3 17.9 (L) 06/16/2021 0501    TCO2 19 (L) 06/16/2021 0501   ACIDBASEDEF 10.0 (H) 06/16/2021 0501   O2SAT 89.0 06/16/2021 0501   CBG (last 3)  Recent Labs    06/16/21 0002 06/16/21 0425 06/16/21 0742  GLUCAP 193* 135* 134*   CXR: ARDS unchanged  Assessment/Plan:  She is hemodynamically more stable today and only on NE now with Impella.  Remains vent dependent with open sternum, ARDS. CCM managing with ARDS protocol.  Afebrile since line change yesterday.  Oliguric renal failure. CRRT starting today to remove volume.  On TNA for nutrition. Needs Cortrak positioned post-pyloric by IR.  LOS: 11 days    Gaye Pollack 06/16/2021

## 2021-06-16 NOTE — Progress Notes (Signed)
Pharmacy Antibiotic Note  [name] [name] is a 60 y.o. female admitted on 05/26/2021 s/p CABG and ECMO + impella.  ECMO removed 6/20. Patient now starting CRRT.  Cx remain negative, WBC elevated 30 on HCT currently afebrile but previously Tm  101 earlier in the week. Will stop vancomycin today. Continue cefepime - adjust for CRRT   Plan: Stop vancomycin Cefepime 2gm IV q12h   Height: 5\' 3"  (160 cm) Weight: 106.4 kg (234 lb 9.1 oz) IBW/kg (Calculated) : 52.4  Temp (24hrs), Avg:98.8 F (37.1 C), Min:97.34 F (36.3 C), Max:100.76 F (38.2 C)  Recent Labs  Lab 06/01/2021 0323 06/20/2021 0921 06/03/2021 1422 05/26/2021 0359 05/29/2021 1720 06/09/2021 1721 06/14/21 0326 06/14/21 1648 06/15/21 0440 06/15/21 1620 06/16/21 0453  WBC  --  9.2   < > 17.2* 19.5*  --  24.2*  --  29.5*  --  30.6*  CREATININE  --   --    < > 2.81*  --    < > 3.27* 3.70* 4.07* 4.52* 5.34*  LATICACIDVEN 0.7 0.9  --   --   --   --   --   --   --   --   --    < > = values in this interval not displayed.    Estimated Creatinine Clearance: 13.1 mL/min (A) (by C-G formula based on SCr of 5.34 mg/dL (H)).    No Known Allergies  Antimicrobials this admission: Vanc 6/15-6/16, 6/20>6/26 Cefaz 6/15 >> 6/21 Cefepime 6/21>   Dose adjustments this admission:   Microbiology results: 6/21 [facility] ngF 6/21 TA no staph, candida dubliniensis 6/20 chest wound ngF 6/16 MRSA PCR: neg    Bonnita Nasuti Pharm.D. CPP, BCPS Clinical Pharmacist [phone] 06/16/2021 11:58 AM

## 2021-06-17 ENCOUNTER — Inpatient Hospital Stay (HOSPITAL_COMMUNITY): Payer: 59

## 2021-06-17 DIAGNOSIS — R140 Abdominal distension (gaseous): Secondary | ICD-10-CM

## 2021-06-17 DIAGNOSIS — I509 Heart failure, unspecified: Secondary | ICD-10-CM

## 2021-06-17 LAB — CBC WITH DIFFERENTIAL/PLATELET
Abs Immature Granulocytes: 13.91 10*3/uL — ABNORMAL HIGH (ref 0.00–0.07)
Basophils Absolute: 0.4 10*3/uL — ABNORMAL HIGH (ref 0.0–0.1)
Basophils Relative: 1 %
Eosinophils Absolute: 0 10*3/uL (ref 0.0–0.5)
Eosinophils Relative: 0 %
HCT: 19.4 % — ABNORMAL LOW (ref 36.0–46.0)
Hemoglobin: 6.6 g/dL — CL (ref 12.0–15.0)
Immature Granulocytes: 31 %
Lymphocytes Relative: 10 %
Lymphs Abs: 4.7 10*3/uL — ABNORMAL HIGH (ref 0.7–4.0)
MCH: 32.7 pg (ref 26.0–34.0)
MCHC: 34 g/dL (ref 30.0–36.0)
MCV: 96 fL (ref 80.0–100.0)
Monocytes Absolute: 2.7 10*3/uL — ABNORMAL HIGH (ref 0.1–1.0)
Monocytes Relative: 6 %
Neutro Abs: 23.2 10*3/uL — ABNORMAL HIGH (ref 1.7–7.7)
Neutrophils Relative %: 52 %
Platelets: 82 10*3/uL — ABNORMAL LOW (ref 150–400)
RBC: 2.02 MIL/uL — ABNORMAL LOW (ref 3.87–5.11)
RDW: 20 % — ABNORMAL HIGH (ref 11.5–15.5)
WBC: 44.9 10*3/uL — ABNORMAL HIGH (ref 4.0–10.5)
nRBC: 20.2 % — ABNORMAL HIGH (ref 0.0–0.2)

## 2021-06-17 LAB — COMPREHENSIVE METABOLIC PANEL
ALT: 1714 U/L — ABNORMAL HIGH (ref 0–44)
AST: >10000 U/L — ABNORMAL HIGH (ref 15–41)
Albumin: 2 g/dL — ABNORMAL LOW (ref 3.5–5.0)
Alkaline Phosphatase: 102 U/L (ref 38–126)
Anion gap: 19 — ABNORMAL HIGH (ref 5–15)
BUN: 26 mg/dL — ABNORMAL HIGH (ref 6–20)
CO2: 14 mmol/L — ABNORMAL LOW (ref 22–32)
Calcium: 7.1 mg/dL — ABNORMAL LOW (ref 8.9–10.3)
Chloride: 98 mmol/L (ref 98–111)
Creatinine, Ser: 2.63 mg/dL — ABNORMAL HIGH (ref 0.44–1.00)
GFR, Estimated: 20 mL/min — ABNORMAL LOW (ref >60)
Glucose, Bld: 163 mg/dL — ABNORMAL HIGH (ref 70–99)
Potassium: 5.1 mmol/L (ref 3.5–5.1)
Sodium: 131 mmol/L — ABNORMAL LOW (ref 135–145)
Total Bilirubin: 13 mg/dL — ABNORMAL HIGH (ref 0.3–1.2)
Total Protein: 4.4 g/dL — ABNORMAL LOW (ref 6.5–8.1)

## 2021-06-17 LAB — BASIC METABOLIC PANEL
Anion gap: 20 — ABNORMAL HIGH (ref 5–15)
BUN: 20 mg/dL (ref 6–20)
CO2: 12 mmol/L — ABNORMAL LOW (ref 22–32)
Calcium: 7 mg/dL — ABNORMAL LOW (ref 8.9–10.3)
Chloride: 102 mmol/L (ref 98–111)
Creatinine, Ser: 2.02 mg/dL — ABNORMAL HIGH (ref 0.44–1.00)
GFR, Estimated: 28 mL/min — ABNORMAL LOW (ref >60)
Glucose, Bld: 200 mg/dL — ABNORMAL HIGH (ref 70–99)
Potassium: 5.5 mmol/L — ABNORMAL HIGH (ref 3.5–5.1)
Sodium: 134 mmol/L — ABNORMAL LOW (ref 135–145)

## 2021-06-17 LAB — GLUCOSE, CAPILLARY
Glucose-Capillary: 141 mg/dL — ABNORMAL HIGH (ref 70–99)
Glucose-Capillary: 149 mg/dL — ABNORMAL HIGH (ref 70–99)
Glucose-Capillary: 153 mg/dL — ABNORMAL HIGH (ref 70–99)
Glucose-Capillary: 201 mg/dL — ABNORMAL HIGH (ref 70–99)

## 2021-06-17 LAB — MAGNESIUM: Magnesium: 2.6 mg/dL — ABNORMAL HIGH (ref 1.7–2.4)

## 2021-06-17 LAB — COOXEMETRY PANEL
Carboxyhemoglobin: 1 % (ref 0.5–1.5)
Methemoglobin: 1.6 % — ABNORMAL HIGH (ref 0.0–1.5)
O2 Saturation: 63 %
Total hemoglobin: 9.1 g/dL — ABNORMAL LOW (ref 12.0–16.0)

## 2021-06-17 LAB — POCT I-STAT 7, (LYTES, BLD GAS, ICA,H+H)
Acid-base deficit: 16 mmol/L — ABNORMAL HIGH (ref 0.0–2.0)
Bicarbonate: 11.6 mmol/L — ABNORMAL LOW (ref 20.0–28.0)
Calcium, Ion: 0.94 mmol/L — ABNORMAL LOW (ref 1.15–1.40)
HCT: 20 % — ABNORMAL LOW (ref 36.0–46.0)
Hemoglobin: 6.8 g/dL — CL (ref 12.0–15.0)
O2 Saturation: 95 %
Patient temperature: 35.2
Potassium: 5.1 mmol/L (ref 3.5–5.1)
Sodium: 131 mmol/L — ABNORMAL LOW (ref 135–145)
TCO2: 13 mmol/L — ABNORMAL LOW (ref 22–32)
pCO2 arterial: 33.9 mmHg (ref 32.0–48.0)
pH, Arterial: 7.132 — CL (ref 7.350–7.450)
pO2, Arterial: 91 mmHg (ref 83.0–108.0)

## 2021-06-17 LAB — LACTIC ACID, PLASMA: Lactic Acid, Venous: >11 mmol/L (ref 0.5–1.9)

## 2021-06-17 LAB — PROTIME-INR
INR: 4 — ABNORMAL HIGH (ref 0.8–1.2)
Prothrombin Time: 38.6 seconds — ABNORMAL HIGH (ref 11.4–15.2)

## 2021-06-17 LAB — PREALBUMIN: Prealbumin: <5 mg/dL — ABNORMAL LOW (ref 18–38)

## 2021-06-17 LAB — PHOSPHORUS: Phosphorus: 5.2 mg/dL — ABNORMAL HIGH (ref 2.5–4.6)

## 2021-06-17 LAB — PREPARE RBC (CROSSMATCH)

## 2021-06-17 LAB — HEPARIN LEVEL (UNFRACTIONATED): Heparin Unfractionated: <0.1 IU/mL — ABNORMAL LOW (ref 0.30–0.70)

## 2021-06-17 LAB — FIBRINOGEN: Fibrinogen: 524 mg/dL — ABNORMAL HIGH (ref 210–475)

## 2021-06-17 LAB — LACTATE DEHYDROGENASE: LDH: >10000 U/L — ABNORMAL HIGH (ref 98–192)

## 2021-06-17 LAB — TRIGLYCERIDES: Triglycerides: 367 mg/dL — ABNORMAL HIGH (ref <150)

## 2021-06-17 LAB — APTT: aPTT: 66 seconds — ABNORMAL HIGH (ref 24–36)

## 2021-06-17 MED ORDER — DEXTROSE 5 % IV SOLN: INTRAVENOUS | Status: DC

## 2021-06-17 MED ORDER — GLYCOPYRROLATE 0.2 MG/ML IJ SOLN: 0.2000 mg | INTRAMUSCULAR | Status: DC | PRN

## 2021-06-17 MED ORDER — ONDANSETRON 4 MG PO TBDP
4.0000 mg | ORAL_TABLET | Freq: Four times a day (QID) | ORAL | Status: DC | PRN
  Filled 2021-06-17: qty 1

## 2021-06-17 MED ORDER — MORPHINE 100MG IN NS 100ML (1MG/ML) PREMIX INFUSION: 0.0000 mg/h | INTRAVENOUS | Status: DC

## 2021-06-17 MED ORDER — ACETAMINOPHEN 650 MG RE SUPP: 650.0000 mg | Freq: Four times a day (QID) | RECTAL | Status: DC | PRN

## 2021-06-17 MED ORDER — MORPHINE SULFATE (PF) 2 MG/ML IV SOLN: 2.0000 mg | INTRAVENOUS | Status: DC | PRN

## 2021-06-17 MED ORDER — GLYCOPYRROLATE 1 MG PO TABS: 1.0000 mg | ORAL_TABLET | ORAL | Status: DC | PRN

## 2021-06-17 MED ORDER — MORPHINE BOLUS VIA INFUSION
5.0000 mg | INTRAVENOUS | Status: DC | PRN
  Filled 2021-06-17: qty 5

## 2021-06-17 MED ORDER — DIPHENHYDRAMINE HCL 50 MG/ML IJ SOLN: 25.0000 mg | INTRAMUSCULAR | Status: DC | PRN

## 2021-06-17 MED ORDER — ACETAMINOPHEN 325 MG PO TABS: 650.0000 mg | ORAL_TABLET | Freq: Four times a day (QID) | ORAL | Status: DC | PRN

## 2021-06-17 MED ORDER — SODIUM CHLORIDE 0.9% IV SOLUTION: Freq: Once | INTRAVENOUS | Status: AC

## 2021-06-17 MED ORDER — POLYVINYL ALCOHOL 1.4 % OP SOLN: 1.0000 [drp] | Freq: Four times a day (QID) | OPHTHALMIC | Status: DC | PRN

## 2021-06-17 MED ORDER — ONDANSETRON HCL 4 MG/2ML IJ SOLN: 4.0000 mg | Freq: Four times a day (QID) | INTRAMUSCULAR | Status: DC | PRN

## 2021-06-17 MED ORDER — LORAZEPAM 2 MG/ML IJ SOLN: 2.0000 mg | INTRAMUSCULAR | Status: DC | PRN

## 2021-06-18 LAB — BPAM RBC
Blood Product Expiration Date: 202207252359
Blood Product Expiration Date: 202207272359
Blood Product Expiration Date: 202207282359
Blood Product Expiration Date: 202207282359
Blood Product Expiration Date: 202207282359
ISSUE DATE / TIME: 202206251005
ISSUE DATE / TIME: 202206270626
ISSUE DATE / TIME: 202206280957
Unit Type and Rh: 5100
Unit Type and Rh: 5100
Unit Type and Rh: 5100
Unit Type and Rh: 5100
Unit Type and Rh: 5100

## 2021-06-18 LAB — TYPE AND SCREEN
ABO/RH(D): O POS
Antibody Screen: NEGATIVE
Unit division: 0
Unit division: 0
Unit division: 0
Unit division: 0
Unit division: 0

## 2021-06-18 LAB — PATHOLOGIST SMEAR REVIEW

## 2021-06-18 LAB — RENAL FUNCTION PANEL
Albumin: 2.3 g/dL — ABNORMAL LOW (ref 3.5–5.0)
Anion gap: 20 — ABNORMAL HIGH (ref 5–15)
BUN: 41 mg/dL — ABNORMAL HIGH (ref 6–20)
CO2: 17 mmol/L — ABNORMAL LOW (ref 22–32)
Calcium: 7.4 mg/dL — ABNORMAL LOW (ref 8.9–10.3)
Chloride: 95 mmol/L — ABNORMAL LOW (ref 98–111)
Creatinine, Ser: 3.78 mg/dL — ABNORMAL HIGH (ref 0.44–1.00)
GFR, Estimated: 13 mL/min — ABNORMAL LOW (ref >60)
Glucose, Bld: 136 mg/dL — ABNORMAL HIGH (ref 70–99)
Phosphorus: 5 mg/dL — ABNORMAL HIGH (ref 2.5–4.6)
Potassium: 4.3 mmol/L (ref 3.5–5.1)
Sodium: 132 mmol/L — ABNORMAL LOW (ref 135–145)

## 2021-06-19 LAB — HAPTOGLOBIN: Haptoglobin: <10 mg/dL — ABNORMAL LOW (ref 33–346)

## 2021-06-21 NOTE — Progress Notes (Signed)
NAME:  [name] [name], MRN:  [id number], DOB:  05-20-1961, LOS: 62 ADMISSION DATE:  05/26/2021, CONSULTATION DATE:  6/15 REFERRING MD:  Roxan Hockey, CHIEF COMPLAINT:  Chest pain   History of Present Illness:  60 year old woman with hx of HTN, HLD, hypothyroidism, CVA, DM2 who presented with angina.  Underwent cardiac cath which showed left main dissection, during this procedure became acutely unstable with crushing chest pain and shock.  Taken emergently to OR for bypass after 3-5 impella placed.  Tough to wean from bypass so left centrally cannulated on VA ECMO and sent to [place].  PCCM consulted to help with management.  Had VA ECMO through 6/20; Sternal wound debridement on 6/22.  Pertinent  Medical History  DM2 Hypertension WPW CAD Multiple brain aneursym  Significant Hospital Events: Including procedures, antibiotic start and stop dates in addition to other pertinent events   6/14 admitted 6/15 spontaneous artery dissection left main on cath.CP Impella placed.   6/15 emergent coronary artery bypass graft, LIMA to LAD, SVG to OM.  Brief cardiac arrest of less than 10 minutes immediately prior to initiation of cardiopulmonary bypass. 6/15 unable to wean.  Still unable to wean from bypass so placed on centrally cannulated VA ECMO. 6/16 EEG shows encephalopathy. 6/16 Cold right hand - seen by vascular, conservative management 6/17 Hand improved. 748mL clot removed with washout at the bedside 6/19 RUL collapse - underwent bronchoscopy and aspiration of RUL mucus plug. 6/20 ECMO Decannulation 6/21>> BIS monitoring started , weaning of sedation, wet CXR 6/21 vanc > 6/26 6/21 cefepime >  6/22 bedside chest washout; started on aggressive laxatives for severe constipation. 6/23 ongoing hemodynamic instability complicated by ectopy. 6/24 tolerating fluid removal with furosemide infusion.,  Oxygenation improving, slight decrease in vasopressor requirements 6/26 decreased pressor requirements,  start CRRT 6/27 pressor requirements up to 40 Levo, 5 Epi, 0.03 Vaso.  Not able to pull fluid from CRRT.  Interim History / Subjective:  Much worse.  Remains on P7 Impella with 40 Levo, 5 Epi, 0.03 Vaso.  SBP in 70s. Dr. Tamala Julian has called family to come in for goals of care.  Objective   Blood pressure (!) 86/73, pulse 80, temperature (!) 95.72 F (35.4 C), resp. rate 16, height 5\' 3"  (1.6 m), weight 104.9 kg, SpO2 (!) 89 %. CVP:  [8 mmHg-31 mmHg] 14 mmHg  Vent Mode: PRVC FiO2 (%):  [40 %-50 %] 50 % Set Rate:  [35 bmp] 35 bmp Vt Set:  [320 mL] 320 mL PEEP:  [14 cmH20] 14 cmH20 Plateau Pressure:  [29 cmH20-30 cmH20] 29 cmH20   Intake/Output Summary (Last 24 hours) at Jul 11, 2021 0824 Last data filed at July 11, 2021 0750 Gross per 24 hour  Intake 5239.5 ml  Output 5763 ml  Net -523.5 ml    Filed Weights   06/15/21 0615 06/16/21 0615 2021/07/11 0600  Weight: 102.7 kg 106.4 kg 104.9 kg    Examination: General: Adult female, critically ill. Neuro: Sedated, not responsive. HEENT: /AT. Sclerae anicteric. ETT in place. Cardiovascular: Sternum open.  RRR, no M/R/G.  Lungs: Respirations even and unlabored.  CTA bilaterally, No W/R/R.  Abdomen: BS x 4, soft, NT/ND.  Musculoskeletal: No gross deformities, no edema.  Skin: Intact, warm, no rashes.   Resolved Hospital Problem list   RUL collapse   Assessment & Plan:  Cardiogenic shock after spontaneous coronary dissection of left main, requiring emergent CABG then ECMO; ECMO catheter removed 6/20, now on Impella Open chest Impella per cardiology Wean off levophed  for MAP > 65 Amiodarone per cardiology Maintain chest dressing, chest closure per TCTS Wean off hydrocortisone  Constipation At risk for malnutrition Continue TPN Bowel regimen Continue ketamine to facilitate decreasing dilaudid rate  Platelet dysfunction Normocytic anemia Monitor for bleeding Hold on transfusion as awaiting family discussions for consideration of  transition to comfort  Sepsis/fever> cultures negative, possible HCAP; lines? CVL new as of 6/26 Continue cefepime and follow blood cultures  Presumed ischemic colitis - LDH, lactate, vasopressor requirements all up. Supportive care only  Acute respiratory failure with hypoxemia ARDS Continue mechanical ventilation per ARDS protocol Target TVol 6-8cc/kgIBW Target Plateau Pressure < 30cm H20 Target driving pressure less than 15 cm of water Target PaO2 55-65: titrate PEEP/FiO2 per protocol No prone positioning with open chest Volume removal target determined by hemodynamics Ventilator associated pneumonia prevention protocol  Shock liver Supportive care  AKI, now oliguric Continue CRRT Monitor BMET and UOP Replace electrolytes as needed  DM2 with hyperglycemia SSI q4h  Need for sedation for mechanical ventilation Ventilator dyssynchrony Neuromuscular blockade protocol with prn vecuronium Dilaudid/versed per protocol Continue ketamine to decrease dilaudid needs   Goals of care: Unfortunately Ms. Rieth has made a turn for the worst with rising pressor requirements, ongoing Impella support, ongoing CRRT with inability for volume removal, worsened labs and concern for ischemic gut.  Dr. Tamala Julian has called the family in for further goals of care and to recommend transition to comfort measures.   Best Practice (right click and "Reselect all SmartList Selections" daily)   Diet/type: tubefeeds Pain/Anxiety/Delirium protocol RASS goal -4 to -5 VAP protocol (if indicated): Yes DVT prophylaxis: SCD GI prophylaxis: PPI Glucose control:  SSI Central venous access:  Yes, and it is still needed Arterial line:  Yes, and it is still needed Foley:  Yes, and it is still needed Mobility:  bed rest  PT consulted: N/A Code Status:  limited Last date of multidisciplinary goals of care discussion [per primary] ccm prognosis: Life-threating and Serious Disposition: remains critically  ill, will stay in intensive care  Critical care time: 40 minutes    Montey Hora, PA - [facility] For pager details, please see AMION or use Epic chat  After 1900, please call [place] for cross coverage needs Jul 15, 2021, 8:38 AM

## 2021-06-21 NOTE — Progress Notes (Signed)
450cc Ketamine wasted in stericycle with Ardelle Park RN

## 2021-06-21 NOTE — Progress Notes (Signed)
Patient in asystole. No heart sounds auscultated for one minute by this RN and Jone Baseman. Time of death confirmed at 1137.  Family notified

## 2021-06-21 NOTE — Progress Notes (Signed)
Advanced Heart Failure Rounding Note   Subjective:    6/15 - LM coronary scad with emergent CABG  -> ECMO 6/17 - chest washout at bedside 6/19 - bronch due to RUL mucous plugging  6/20 - ECMO decannulation. Intra-op TEE EF ~35% 6/21 - Repeat washout 6/21 - DCCV   Decannulated from ECMO 6/20 Chest remains open. Sedated on vent.   Remains on Impella now on to  P-7 Flow 3.0 Waveforms ok. No alarms. LDH crising  Vent down to 50% CXR diffuse bilateral infiltrates. No change  Much worse overnight. Vasopressors way up. More acidotic despite CVVHD LFTs climbing with AST > 10K    Objective:   Weight Range:  Vital Signs:   Temp:  [93.92 F (34.4 C)-97.88 F (36.6 C)] 95.72 F (35.4 C) (06/27 0751) Pulse Rate:  [80-93] 80 (06/27 0751) Resp:  [0-36] 16 (06/27 0751) BP: (63-126)/(33-105) 86/73 (06/27 0751) SpO2:  [88 %-100 %] 89 % (06/27 0751) Arterial Line BP: (57-101)/(47-76) 86/71 (06/27 0700) FiO2 (%):  [40 %-50 %] 50 % (06/27 0751) Weight:  [104.9 kg] 104.9 kg (06/27 0600) Last BM Date: 06/15/21  Weight change: Filed Weights   06/15/21 0615 06/16/21 0615 07/08/2021 0600  Weight: 102.7 kg 106.4 kg 104.9 kg    Intake/Output:   Intake/Output Summary (Last 24 hours) at 07/08/21 0854 Last data filed at July 08, 2021 0750 Gross per 24 hour  Intake 5239.5 ml  Output 5763 ml  Net -523.5 ml      Physical Exam: General: Sedated on vent. Terminally ill appearing Chest open  HEENT: normal + ETT Neck: supple. RIJ HD cath Carotids 2+ bilat; no bruits. No lymphadenopathy or thryomegaly appreciated. Cor: chest open +CTs Lungs: coarse Abdomen: soft, nontender, + distended. No hepatosplenomegaly. No bowel sounds. Extremities: no cyanosis, clubbing, rash, 2-3+ edema + impella  Neuro: unresponsive  Telemetry: NSR 80-90s Personally reviewed   Labs: Basic Metabolic Panel: Recent Labs  Lab 06/11/21 0331 06/11/21 0542 06/03/2021 0330 05/28/2021 0459 06/14/21 0326  06/14/21 1648 06/15/21 0440 06/15/21 0639 06/15/21 1620 06/16/21 0453 06/16/21 0501 06/16/21 1624 06/16/21 1844 Jul 08, 2021 0443  NA 136   < > 133*   < > 130*   < > 130*   < > 132* 128* 129* 131* 131* 131*  K 4.5   < > 4.3   < > 4.2   < > 3.8   < > 4.2 4.1 4.1 4.3 4.3 5.1  CL 108   < > 104   < > 100   < > 98  --  98 96*  --  99  --  98  CO2 21*   < > 20*   < > 19*   < > 20*  --  21* 17*  --  18*  --  14*  GLUCOSE 102*   < > 141*   < > 138*   < > 157*  --  138* 137*  --  141*  --  163*  BUN 16   < > 20   < > 29*   < > 38*  --  46* 56*  --  41*  --  26*  CREATININE 1.88*   < > 2.70*   < > 3.27*   < > 4.07*  --  4.52* 5.34*  --  3.92*  --  2.63*  CALCIUM 8.1*   < > 7.8*   < > 7.4*   < > 7.7*  --  7.8* 7.7*  --  7.6*  --  7.1*  MG 1.7  --  2.2  --  2.8*  --  2.7*  --   --   --   --   --   --  2.6*  PHOS  --   --   --   --  5.8*  --  5.4*  --   --   --   --   --   --  5.2*   < > = values in this interval not displayed.     Liver Function Tests: Recent Labs  Lab 06/15/2021 0359 06/14/21 0326 06/15/21 0440 06/16/21 0453 07-17-2021 0443  AST 59* 59* 72* 100* >10,000*  ALT '6 6 7 9 ' 1,714*  ALKPHOS 67 75 76 65 102  BILITOT 1.3* 1.7* 2.1* 4.4* 13.0*  PROT 4.5* 4.6* 5.0* 4.9* 4.4*  ALBUMIN 2.0* 2.0* 1.9* 2.4* 2.0*    No results for input(s): LIPASE, AMYLASE in the last 168 hours. No results for input(s): AMMONIA in the last 168 hours.  CBC: Recent Labs  Lab 05/28/2021 1720 06/14/21 0322 06/14/21 0326 06/15/21 0440 06/15/21 0639 06/15/21 1441 06/16/21 0453 06/16/21 0501 06/16/21 1844 17-Jul-2021 0443  WBC 19.5*  --  24.2* 29.5*  --   --  30.6*  --   --  44.9*  NEUTROABS  --   --   --  23.8*  --   --  21.4*  --   --  23.2*  HGB 7.7*   < > 7.6* 7.0*   < > 8.8* 7.1* 7.8* 7.8* 6.6*  HCT 23.3*   < > 22.3* 20.3*   < > 26.0* 21.4* 23.0* 23.0* 19.4*  MCV 89.6  --  88.1 86.8  --   --  91.5  --   --  96.0  PLT 50*  --  64* 90*  --   --  85*  --   --  82*   < > = values in this interval not  displayed.     Cardiac Enzymes: No results for input(s): CKTOTAL, CKMB, CKMBINDEX, TROPONINI in the last 168 hours.  BNP: BNP (last 3 results) No results for input(s): BNP in the last 8760 hours.  ProBNP (last 3 results) No results for input(s): PROBNP in the last 8760 hours.    Other results:  Imaging: DG Abd 1 View  Result Date: 06/16/2021 CLINICAL DATA:  60 year old female with a history of abdominal distension Admission 05/27/2021 for chest pain, emergent CABG x2 05/25/2021, ECMO EXAM: ABDOMEN - 1 VIEW COMPARISON:  Plain film 06/14/2021, multiple prior chest x-rays dating to 06/03/2021 FINDINGS: Similar surgical apparatus of the lower chest including the Impella device within the visualized aorta and left ventricle, mediastinal/pleural drains, epicardial pacing leads. The gastric tube terminates near the pylorus. The weighted tip enteric feeding tube terminates in a similar location near the pylorus. Relative paucity of small bowel and colonic gas. No air-fluid levels. No evidence of obstruction. IMPRESSION: The abdominal plain film is negative for gaseous abdominal distention, with a relative paucity of bowel gas. Similar configuration of support apparatus/surgical apparatus. Electronically Signed   By: Corrie Mckusick D.O.   On: 06/16/2021 10:30   DG Chest Port 1 View  Result Date: 07/17/2021 CLINICAL DATA:  Hypoxia EXAM: PORTABLE CHEST 1 VIEW COMPARISON:  June 16, 2021 FINDINGS: Endotracheal tube tip is 4.1 cm above the carina. Impella device present. Left jugular catheter tip at cavoatrial junction. Right jugular catheter tip in superior vena cava. Enteric tube tips are below the diaphragm. There is  a left chest tube. Temporary pacemaker wires are attached to the right heart. No pneumothorax. There is a small pleural effusion on each side with atelectatic change in the lung bases. Heart is mildly enlarged with pulmonary vascularity normal. No adenopathy. No bone lesions. IMPRESSION:  Tube and catheter positions as described without evident pneumothorax. Small pleural effusions bilaterally with bibasilar atelectasis, somewhat more on the left than on the right, stable. Stable cardiac prominence. Electronically Signed   By: Lowella Grip III M.D.   On: 07-09-2021 07:53   DG CHEST PORT 1 VIEW  Result Date: 06/16/2021 CLINICAL DATA:  Central line placement. EXAM: PORTABLE CHEST 1 VIEW COMPARISON:  06/16/2021 at 6:30 a.m. FINDINGS: Right internal jugular central venous catheter has been placed, tip projecting in the mid superior vena cava. No pneumothorax. Remaining lines and tubes are stable. Persistent bilateral interstitial and hazy airspace lung opacities, also unchanged from the earlier exam. IMPRESSION: 1. New right internal jugular central venous line has its catheter tip in the mid superior vena cava. No pneumothorax and no other change from the earlier exam. Electronically Signed   By: Lajean Manes M.D.   On: 06/16/2021 10:31   DG Chest Port 1 View  Result Date: 06/16/2021 CLINICAL DATA:  Acute hypercapnic respiratory failure. Follow-up exam. EXAM: PORTABLE CHEST 1 VIEW COMPARISON:  06/15/2021 and older studies. FINDINGS: Mild interval decrease in airspace lung opacities, most evident in the left upper to mid lung. No new lung abnormalities. Right internal jugular central venous line has been removed. Remaining support apparatus is stable and well positioned. No pneumothorax. IMPRESSION: 1. Mild interval improvement and airspace lung opacities most evident in the left mid to upper lung. 2. Status post removal of the right internal jugular central venous line. No pneumothorax. 3. No other change.  Remaining support apparatus is stable. Electronically Signed   By: Lajean Manes M.D.   On: 06/16/2021 09:46   DG CHEST PORT 1 VIEW  Result Date: 06/15/2021 CLINICAL DATA:  Left central line placement. EXAM: PORTABLE CHEST 1 VIEW COMPARISON:  06/15/2021 at 5:42 a.m. FINDINGS: New  left internal jugular central venous line has its tip in the lower superior vena cava, well positioned. No pneumothorax. No change in the previously described lines and tubes. No change in the bilateral airspace lung opacities. IMPRESSION: 1. New left internal jugular central venous catheter, tip lying in the lower superior vena cava. No pneumothorax. 2. No other change from the earlier study Electronically Signed   By: Lajean Manes M.D.   On: 06/15/2021 11:11     Medications:     Scheduled Medications:  sodium chloride   Intravenous Once   artificial tears  1 application Both Eyes U9W   aspirin  324 mg Per Tube Daily   chlorhexidine gluconate (MEDLINE KIT)  15 mL Mouth Rinse BID   Chlorhexidine Gluconate Cloth  6 each Topical Daily   docusate  200 mg Per Tube Daily   hydrocortisone sod succinate (SOLU-CORTEF) inj  100 mg Intravenous Q12H   insulin aspart  0-9 Units Subcutaneous Q4H   mouth rinse  15 mL Mouth Rinse 10 times per day   metoCLOPramide (REGLAN) injection  5 mg Intravenous Q8H   pantoprazole sodium  40 mg Per Tube Daily   polyethylene glycol  17 g Oral BID   rosuvastatin  40 mg Per Tube q1800   sennosides  10 mL Per Tube Daily   sodium chloride flush  10-40 mL Intracatheter Q12H   sodium chloride  flush  3 mL Intravenous Q12H    Infusions:   prismasol BGK 4/2.5 500 mL/hr at 07/14/21 0646    prismasol BGK 4/2.5 500 mL/hr at 07-14-2021 0647   sodium chloride Stopped (06/09/2021 0918)   sodium chloride 10 mL/hr at 07-14-21 0700   amiodarone 60 mg/hr (14-Jul-2021 0700)   ceFEPime (MAXIPIME) IV Stopped (06/16/21 2212)   epinephrine 5 mcg/min (07-14-21 0700)   feeding supplement (PIVOT 1.5 CAL) Stopped (06/16/21 1200)   heparin 50 units/mL (Impella PURGE) in dextrose 5 % 1000 mL bag     HYDROmorphone Stopped (Jul 14, 2021 0037)   ketamine (KETALAR) Adult IV Infusion 1.5 mg/kg/hr (07/14/2021 0701)   lactated ringers Stopped (06/15/21 1137)   midazolam 2 mg/hr (Jul 14, 2021 0700)    norepinephrine (LEVOPHED) Adult infusion 40 mcg/min (07/14/2021 0700)   prismasol BGK 4/2.5 2,000 mL/hr at 07-14-2021 0657   TPN ADULT (ION) 75 mL/hr at 07/14/21 0700   vasopressin 0.04 Units/min (14-Jul-2021 0700)    PRN Medications: sodium chloride, sodium chloride, sodium chloride, sodium chloride, acetaminophen (TYLENOL) oral liquid 160 mg/5 mL, bisacodyl, heparin, HYDROmorphone, ipratropium-albuterol, metoprolol tartrate, midazolam, midazolam, ondansetron (ZOFRAN) IV, polyvinyl alcohol, sodium chloride, sodium chloride flush, sodium chloride flush, vecuronium   Assessment/Plan   Cardiogenic shock in setting of left main dissection and emergent CABG - chest open on V-A ECMO with Impella vent. S/p chest washout 6/17 - TEE 9/19 EF 35-40% - ECMO decannulated 6/20  - Impella at P-7  Flow 3.0 Waveforms ok. LDH rising - Vasopressors way up. Now in diffuse MSOF. Acidotic - She has no chance of survival unfortunately.  - D/w family. Plan withdrawal of care  2. CAD with acute due to left main dissection - s/p emergent CABG 05/28/2021 - continue ASA/statin  3. Acute respiratory failure due to above -> ARDS - On vent. ARDS proitocl  - WD of care today  4. Sepsis - developed high fevers on 6/21. Vanc/cefepime added  5. Shock liver   Vasopressors way up. Now in diffuse MSOF. Acidotic. She has no chance of survival unfortunately. D/w family. Plan withdrawal of care  CRITICAL CARE Performed by: Glori Bickers  Total critical care time: 45 minutes  Critical care time was exclusive of separately billable procedures and treating other patients.  Critical care was necessary to treat or prevent imminent or life-threatening deterioration.  Critical care was time spent personally by me (independent of midlevel providers or residents) on the following activities: development of treatment plan with patient and/or surrogate as well as nursing, discussions with consultants, evaluation of patient's  response to treatment, examination of patient, obtaining history from patient or surrogate, ordering and performing treatments and interventions, ordering and review of laboratory studies, ordering and review of radiographic studies, pulse oximetry and re-evaluation of patient's condition.    Length of Stay: 12   Glori Bickers MD 07-14-21, 8:54 AM  Advanced Heart Failure Team Pager [phone] (M-F; 7a - 4p)  Please contact [facility] for night-coverage after hours (4p -7a ) and weekends on [email or web address]

## 2021-06-21 NOTE — Progress Notes (Signed)
[facility] NEPHROLOGY PROGRESS NOTE  Assessment/ Plan: Pt is a 60 y.o. yo female with history of HTN, HLD, stroke, DM presented on 6/14 with angina, underwent emergent CABG on 6/15 with cardiogenic shock and AKI.  #Acute kidney injury due to ischemic ATN caused by cardiogenic shock.  Started CRRT on 6/26 for anuric AKI.  Currently on all 4K bath with morning potassium 5.1.  We will repeat BMP and change prescription if needed.  Unable to UF because of shock requiring multiple pressors.  Plan to check urine when we can obtain sample and kidney ultrasound once stable.  #Cardiogenic shock with a left main dissection after emergent CABG.  Required ECMO which was switched to Impella.  Status post chest washout on 6/17.  TEE with EF 35 to 40%.  Currently on multiple pressors including epi, levo, vasopressin.  Cardiology is following.  #Acute respiratory failure with hypoxia/ARDS: Currently on vent.  Per PCCM team.  #Anemia: Transfusion PRBC per primary team.  #Multiorgan failure with elevated LFTs, lactic acidosis, AKI, cardiogenic shock, respiratory failure.  #Lactic and metabolic acidosis: Maintain perfusion and management as above.  Discussed with ICU nurse.  Subjective: Seen and examined ICU.  No urine output recorded.  On multiple pressures with soft blood pressure.  Running CRRT without much UF.  Potassium 5.1, hemoglobin 6.6. Objective Vital signs in last 24 hours: Vitals:   07-Jul-2021 0630 07/07/21 0645 07/07/21 0700 07/07/21 0751  BP: (!) 83/62  (!) 70/58 (!) 86/73  Pulse:    80  Resp: (!) 35 (!) 35 (!) 35 16  Temp: (!) 96.08 F (35.6 C) (!) 95.9 F (35.5 C) (!) 96.08 F (35.6 C) (!) 95.72 F (35.4 C)  TempSrc:  Esophageal    SpO2:    (!) 89%  Weight:      Height:       Weight change: -1.5 kg  Intake/Output Summary (Last 24 hours) at July 07, 2021 0756 Last data filed at July 07, 2021 0700 Gross per 24 hour  Intake 5073.2 ml  Output 5763 ml  Net -689.8 ml        Labs: Basic Metabolic Panel: Recent Labs  Lab 06/14/21 0326 06/14/21 1648 06/15/21 0440 06/15/21 0639 06/16/21 0453 06/16/21 0501 06/16/21 1624 06/16/21 1844 2021/07/07 0443  NA 130*   < > 130*   < > 128*   < > 131* 131* 131*  K 4.2   < > 3.8   < > 4.1   < > 4.3 4.3 5.1  CL 100   < > 98   < > 96*  --  99  --  98  CO2 19*   < > 20*   < > 17*  --  18*  --  14*  GLUCOSE 138*   < > 157*   < > 137*  --  141*  --  163*  BUN 29*   < > 38*   < > 56*  --  41*  --  26*  CREATININE 3.27*   < > 4.07*   < > 5.34*  --  3.92*  --  2.63*  CALCIUM 7.4*   < > 7.7*   < > 7.7*  --  7.6*  --  7.1*  PHOS 5.8*  --  5.4*  --   --   --   --   --  5.2*   < > = values in this interval not displayed.   Liver Function Tests: Recent Labs  Lab 06/15/21 0440 06/16/21 0453  06-21-2021 0443  AST 72* 100* >10,000*  ALT 7 9 1,714*  ALKPHOS 76 65 102  BILITOT 2.1* 4.4* 13.0*  PROT 5.0* 4.9* 4.4*  ALBUMIN 1.9* 2.4* 2.0*   No results for input(s): LIPASE, AMYLASE in the last 168 hours. No results for input(s): AMMONIA in the last 168 hours. CBC: Recent Labs  Lab 06/02/2021 1720 06/14/21 0322 06/14/21 0326 06/15/21 0440 06/15/21 0639 06/16/21 0453 06/16/21 0501 06/16/21 1844 2021/06/21 0443  WBC 19.5*  --  24.2* 29.5*  --  30.6*  --   --  44.9*  NEUTROABS  --   --   --  23.8*  --  21.4*  --   --  23.2*  HGB 7.7*   < > 7.6* 7.0*   < > 7.1* 7.8* 7.8* 6.6*  HCT 23.3*   < > 22.3* 20.3*   < > 21.4* 23.0* 23.0* 19.4*  MCV 89.6  --  88.1 86.8  --  91.5  --   --  96.0  PLT 50*  --  64* 90*  --  85*  --   --  82*   < > = values in this interval not displayed.   Cardiac Enzymes: No results for input(s): CKTOTAL, CKMB, CKMBINDEX, TROPONINI in the last 168 hours. CBG: Recent Labs  Lab 06/16/21 1133 06/16/21 1633 06/16/21 2004 06/16/21 2340 2021/06/21 0346  GLUCAP 141* 149* 143* 150* 153*    Iron Studies: No results for input(s): IRON, TIBC, TRANSFERRIN, FERRITIN in the last 72  hours. Studies/Results: DG Abd 1 View  Result Date: 06/16/2021 CLINICAL DATA:  60 year old female with a history of abdominal distension Admission 05/25/2021 for chest pain, emergent CABG x2 05/25/2021, ECMO EXAM: ABDOMEN - 1 VIEW COMPARISON:  Plain film 06/14/2021, multiple prior chest x-rays dating to 06/19/2021 FINDINGS: Similar surgical apparatus of the lower chest including the Impella device within the visualized aorta and left ventricle, mediastinal/pleural drains, epicardial pacing leads. The gastric tube terminates near the pylorus. The weighted tip enteric feeding tube terminates in a similar location near the pylorus. Relative paucity of small bowel and colonic gas. No air-fluid levels. No evidence of obstruction. IMPRESSION: The abdominal plain film is negative for gaseous abdominal distention, with a relative paucity of bowel gas. Similar configuration of support apparatus/surgical apparatus. Electronically Signed   By: Corrie Mckusick D.O.   On: 06/16/2021 10:30   DG Chest Port 1 View  Result Date: 06/21/2021 CLINICAL DATA:  Hypoxia EXAM: PORTABLE CHEST 1 VIEW COMPARISON:  June 16, 2021 FINDINGS: Endotracheal tube tip is 4.1 cm above the carina. Impella device present. Left jugular catheter tip at cavoatrial junction. Right jugular catheter tip in superior vena cava. Enteric tube tips are below the diaphragm. There is a left chest tube. Temporary pacemaker wires are attached to the right heart. No pneumothorax. There is a small pleural effusion on each side with atelectatic change in the lung bases. Heart is mildly enlarged with pulmonary vascularity normal. No adenopathy. No bone lesions. IMPRESSION: Tube and catheter positions as described without evident pneumothorax. Small pleural effusions bilaterally with bibasilar atelectasis, somewhat more on the left than on the right, stable. Stable cardiac prominence. Electronically Signed   By: Lowella Grip III M.D.   On: Jun 21, 2021 07:53   DG  CHEST PORT 1 VIEW  Result Date: 06/16/2021 CLINICAL DATA:  Central line placement. EXAM: PORTABLE CHEST 1 VIEW COMPARISON:  06/16/2021 at 6:30 a.m. FINDINGS: Right internal jugular central venous catheter has been placed, tip projecting in the  mid superior vena cava. No pneumothorax. Remaining lines and tubes are stable. Persistent bilateral interstitial and hazy airspace lung opacities, also unchanged from the earlier exam. IMPRESSION: 1. New right internal jugular central venous line has its catheter tip in the mid superior vena cava. No pneumothorax and no other change from the earlier exam. Electronically Signed   By: Lajean Manes M.D.   On: 06/16/2021 10:31   DG Chest Port 1 View  Result Date: 06/16/2021 CLINICAL DATA:  Acute hypercapnic respiratory failure. Follow-up exam. EXAM: PORTABLE CHEST 1 VIEW COMPARISON:  06/15/2021 and older studies. FINDINGS: Mild interval decrease in airspace lung opacities, most evident in the left upper to mid lung. No new lung abnormalities. Right internal jugular central venous line has been removed. Remaining support apparatus is stable and well positioned. No pneumothorax. IMPRESSION: 1. Mild interval improvement and airspace lung opacities most evident in the left mid to upper lung. 2. Status post removal of the right internal jugular central venous line. No pneumothorax. 3. No other change.  Remaining support apparatus is stable. Electronically Signed   By: Lajean Manes M.D.   On: 06/16/2021 09:46   DG CHEST PORT 1 VIEW  Result Date: 06/15/2021 CLINICAL DATA:  Left central line placement. EXAM: PORTABLE CHEST 1 VIEW COMPARISON:  06/15/2021 at 5:42 a.m. FINDINGS: New left internal jugular central venous line has its tip in the lower superior vena cava, well positioned. No pneumothorax. No change in the previously described lines and tubes. No change in the bilateral airspace lung opacities. IMPRESSION: 1. New left internal jugular central venous catheter, tip  lying in the lower superior vena cava. No pneumothorax. 2. No other change from the earlier study Electronically Signed   By: Lajean Manes M.D.   On: 06/15/2021 11:11    Medications: Infusions:   prismasol BGK 4/2.5 500 mL/hr at 07/11/21 0646    prismasol BGK 4/2.5 500 mL/hr at 07-11-2021 0647   sodium chloride Stopped (06/06/2021 0918)   sodium chloride 10 mL/hr at July 11, 2021 0700   amiodarone 60 mg/hr (07/11/2021 0700)   ceFEPime (MAXIPIME) IV Stopped (06/16/21 2212)   epinephrine 5 mcg/min (07/11/21 0700)   feeding supplement (PIVOT 1.5 CAL) Stopped (06/16/21 1200)   heparin 50 units/mL (Impella PURGE) in dextrose 5 % 1000 mL bag     HYDROmorphone Stopped (July 11, 2021 0037)   ketamine (KETALAR) Adult IV Infusion 1.5 mg/kg/hr (07-11-21 0701)   lactated ringers Stopped (06/15/21 1137)   midazolam 2 mg/hr (2021/07/11 0700)   norepinephrine (LEVOPHED) Adult infusion 40 mcg/min (11-Jul-2021 0700)   prismasol BGK 4/2.5 2,000 mL/hr at July 11, 2021 0657   TPN ADULT (ION) 75 mL/hr at Jul 11, 2021 0700   vasopressin 0.04 Units/min (07/11/21 0700)    Scheduled Medications:  sodium chloride   Intravenous Once   artificial tears  1 application Both Eyes K4M   aspirin  324 mg Per Tube Daily   chlorhexidine gluconate (MEDLINE KIT)  15 mL Mouth Rinse BID   Chlorhexidine Gluconate Cloth  6 each Topical Daily   docusate  200 mg Per Tube Daily   hydrocortisone sod succinate (SOLU-CORTEF) inj  100 mg Intravenous Q12H   insulin aspart  0-9 Units Subcutaneous Q4H   mouth rinse  15 mL Mouth Rinse 10 times per day   metoCLOPramide (REGLAN) injection  5 mg Intravenous Q8H   pantoprazole sodium  40 mg Per Tube Daily   polyethylene glycol  17 g Oral BID   rosuvastatin  40 mg Per Tube q1800   sennosides  10 mL Per Tube Daily   sodium chloride flush  10-40 mL Intracatheter Q12H   sodium chloride flush  3 mL Intravenous Q12H    have reviewed scheduled and prn medications.  Physical Exam: General: Critically ill looking  female, intubated, sedated Heart:RRR, s1s2 nl Lungs: Chest wound has dressing. Abdomen:soft, non-distended Extremities:trace LE edema Dialysis Access: Temporary right IJ HD catheter placed by ICU on 6/26.  Aaden Buckman Prasad Rockland Kotarski June 20, 2021,7:56 AM  LOS: 12 days

## 2021-06-21 NOTE — Death Summary Note (Signed)
DEATH SUMMARY   Patient Details  Name: [name] [name] MRN: [id number] DOB: 1961-03-08  Admission/Discharge Information   Admit Date:  2021/07/03  Date of Death: Date of Death: 2021-07-16  Time of Death: Time of Death: 04-07-36  Length of Stay: 04-01-23  Referring Physician: Lucianne Lei, MD   Reason(s) for Hospitalization  Coronary artery dissection  Diagnoses  Preliminary cause of death:  Secondary Diagnoses (including complications and co-morbidities):  Principal Problem:   Cardiogenic shock ([place]) Active Problems:   Cardiorespiratory failure ([place])   Acute hypercapnic respiratory failure ([facility])   Chest pain   Non-ST elevation (NSTEMI) myocardial infarction ([place])   S/P CABG x 2   Left main coronary artery disease   Abdominal distention   Heart failure [facility])   Brief Hospital Course (including significant findings, care, treatment, and services provided and events leading to death)  60 year old woman with hx of HTN, HLD, hypothyroidism, CVA, DM2 who presented with angina.  Underwent cardiac cath which showed left main dissection, during this procedure became acutely unstable with crushing chest pain and shock.  Taken emergently to OR for bypass after 3-5 impella placed.  Tough to wean from bypass so left centrally cannulated on [facility] ECMO and sent to [facility].  PCCM consulted to help with management.  Had VA ECMO through 6/20; Sternal wound debridement on 6/22.  Despite aggressive vent management, CRRT, pressors, mechanical and inotropic support, patient eventually developed irreversible multiorgan failure and passed.   Pertinent Labs and Studies  Significant Diagnostic Studies DG Chest 1 View  Result Date: Jul 03, 2021 CLINICAL DATA:  Chest pain since early this morning, hypertension, smoker EXAM: CHEST  1 VIEW COMPARISON:  Portable exam 0938 hours compared to 07/21/2017 FINDINGS: VP shunt tubing traverses RIGHT hemithorax. Borderline enlargement of cardiac silhouette. Mediastinal contours and  pulmonary vascularity normal. Minimal subsegmental atelectasis RIGHT upper lobe. Lungs otherwise clear. No pulmonary infiltrate, pleural effusion, or pneumothorax. Bones demineralized. IMPRESSION: Minimal subsegmental atelectasis RIGHT upper lobe. Electronically Signed   By: Lavonia Dana M.D.   On: 07/03/2021 09:52   DG Abd 1 View  Result Date: 06/16/2021 CLINICAL DATA:  60 year old female with a history of abdominal distension Admission 07/03/2021 for chest pain, emergent CABG x2 06/09/2021, ECMO EXAM: ABDOMEN - 1 VIEW COMPARISON:  Plain film 06/14/2021, multiple prior chest x-rays dating to Jul 03, 2021 FINDINGS: Similar surgical apparatus of the lower chest including the Impella device within the visualized aorta and left ventricle, mediastinal/pleural drains, epicardial pacing leads. The gastric tube terminates near the pylorus. The weighted tip enteric feeding tube terminates in a similar location near the pylorus. Relative paucity of small bowel and colonic gas. No air-fluid levels. No evidence of obstruction. IMPRESSION: The abdominal plain film is negative for gaseous abdominal distention, with a relative paucity of bowel gas. Similar configuration of support apparatus/surgical apparatus. Electronically Signed   By: Corrie Mckusick D.O.   On: 06/16/2021 10:30   DG Abd 1 View  Result Date: 06/14/2021 CLINICAL DATA:  Constipation. Status post sternal wound debridement on 06/19/2021 and ventricular assist device insertion and coronary artery bypass graft x 2 on 06/06/2021. EXAM: ABDOMEN - 1 VIEW COMPARISON:  05/24/2021 FINDINGS: Paucity of intestinal gas with no visible dilated bowel loops and no visible stool in the included portions of the colon. Feeding tube tip in the duodenal bulb. Nasogastric tube tip in the distal stomach. Partially included Swan-Ganz catheter. Right ventriculoperitoneal shunt catheter tip in the right mid abdomen medially. Expected postsurgical air in the mediastinum. Unremarkable  bones. IMPRESSION: No acute abnormality.  No visible stool in the colon. Electronically Signed   By: Claudie Revering M.D.   On: 06/14/2021 08:42   CARDIAC CATHETERIZATION  Addendum Date: 06/14/2021    Mid LM to Dist LM lesion is 80% stenosed. Has the appearance of spontaneous coronary artery dissection  Ost Cx to Prox Cx lesion is 80% stenosed. Has the appearance of extension from the left main dissection into the proximal circumflex.  Prox LAD lesion is 100% stenosed. Occlusion resulting from extension of the left main dissection into the LAD.  LV end diastolic pressure is normal.  There is no aortic valve stenosis.  Successful placement of Impella CP device in the right groin.  CT surgery consult obtained.  She will go to the operating room for emergent bypass surgery.  Will convey message to anesthesia to look for larger aortic dissection with their TEE probe.   Result Date: 05/23/2021  Mid LM to Dist LM lesion is 80% stenosed. Has the appearance of spontaneous coronary artery dissection  Ost Cx to Prox Cx lesion is 80% stenosed. Has the appearance of extension from the left main dissection into the proximal circumflex.  Prox LAD lesion is 100% stenosed. Occlusion resulting from extension of the left main dissection into the LAD.  LV end diastolic pressure is normal.  There is no aortic valve stenosis.  Successful placement of Impella CP device in the right groin.  CT surgery consult obtained.  She will go to the operating room for emergent bypass surgery.  Will convey message to anesthesia to look for larger aortic dissection with their TEE probe.   DG Chest Port 1 View  Result Date: 30-Jun-2021 CLINICAL DATA:  Hypoxia EXAM: PORTABLE CHEST 1 VIEW COMPARISON:  June 16, 2021 FINDINGS: Endotracheal tube tip is 4.1 cm above the carina. Impella device present. Left jugular catheter tip at cavoatrial junction. Right jugular catheter tip in superior vena cava. Enteric tube tips are below the  diaphragm. There is a left chest tube. Temporary pacemaker wires are attached to the right heart. No pneumothorax. There is a small pleural effusion on each side with atelectatic change in the lung bases. Heart is mildly enlarged with pulmonary vascularity normal. No adenopathy. No bone lesions. IMPRESSION: Tube and catheter positions as described without evident pneumothorax. Small pleural effusions bilaterally with bibasilar atelectasis, somewhat more on the left than on the right, stable. Stable cardiac prominence. Electronically Signed   By: Lowella Grip III M.D.   On: 30-Jun-2021 07:53   DG CHEST PORT 1 VIEW  Result Date: 06/16/2021 CLINICAL DATA:  Central line placement. EXAM: PORTABLE CHEST 1 VIEW COMPARISON:  06/16/2021 at 6:30 a.m. FINDINGS: Right internal jugular central venous catheter has been placed, tip projecting in the mid superior vena cava. No pneumothorax. Remaining lines and tubes are stable. Persistent bilateral interstitial and hazy airspace lung opacities, also unchanged from the earlier exam. IMPRESSION: 1. New right internal jugular central venous line has its catheter tip in the mid superior vena cava. No pneumothorax and no other change from the earlier exam. Electronically Signed   By: Lajean Manes M.D.   On: 06/16/2021 10:31   DG Chest Port 1 View  Result Date: 06/16/2021 CLINICAL DATA:  Acute hypercapnic respiratory failure. Follow-up exam. EXAM: PORTABLE CHEST 1 VIEW COMPARISON:  06/15/2021 and older studies. FINDINGS: Mild interval decrease in airspace lung opacities, most evident in the left upper to mid lung. No new lung abnormalities. Right internal jugular central venous line  has been removed. Remaining support apparatus is stable and well positioned. No pneumothorax. IMPRESSION: 1. Mild interval improvement and airspace lung opacities most evident in the left mid to upper lung. 2. Status post removal of the right internal jugular central venous line. No pneumothorax.  3. No other change.  Remaining support apparatus is stable. Electronically Signed   By: Lajean Manes M.D.   On: 06/16/2021 09:46   DG CHEST PORT 1 VIEW  Result Date: 06/15/2021 CLINICAL DATA:  Left central line placement. EXAM: PORTABLE CHEST 1 VIEW COMPARISON:  06/15/2021 at 5:42 a.m. FINDINGS: New left internal jugular central venous line has its tip in the lower superior vena cava, well positioned. No pneumothorax. No change in the previously described lines and tubes. No change in the bilateral airspace lung opacities. IMPRESSION: 1. New left internal jugular central venous catheter, tip lying in the lower superior vena cava. No pneumothorax. 2. No other change from the earlier study Electronically Signed   By: Lajean Manes M.D.   On: 06/15/2021 11:11   DG Chest Port 1 View  Result Date: 06/15/2021 CLINICAL DATA:  60 year old woman with hx of HTN, HLD, hypothyroidism, CVA, DM2 who presented with angina. Underwent cardiac cath which showed left main dissection, during this procedure became acutely unstable with crushing chest pain and shock. Taken emergently to OR for bypass after 3-5 impella placed. Tough to wean from bypass so left centrally cannulated on VA ECMO and sent to [place]. EXAM: PORTABLE CHEST 1 VIEW COMPARISON:  06/14/2021 and older exams. FINDINGS: There has been interval worsening with an increase in bilateral airspace lung opacities. No convincing pleural effusion.  No pneumothorax. Endotracheal tube tip lies at the medial clavicular heads, currently projecting 3.5 cm above the carina. Nasal/orogastric tube, feeding tube, right internal jugular central venous line, mediastinal tube and left chest tube are stable. No mediastinal widening. IMPRESSION: 1. Interval worsening of lung aeration with increased bilateral airspace lung opacities consistent with pulmonary edema. 2. Stable support apparatus. Electronically Signed   By: Lajean Manes M.D.   On: 06/15/2021 07:55   DG CHEST PORT 1  VIEW  Result Date: 06/14/2021 CLINICAL DATA:  Cardiorespiratory failure. EXAM: PORTABLE CHEST 1 VIEW COMPARISON:  06/08/2021 FINDINGS: Endotracheal tube is 7.0 cm above the carina near the thoracic in light. Again noted is a left ventricular assist device with the tip in the left ventricle region. Pulmonary artery catheter tip is in the main pulmonary artery/proximal right pulmonary artery region. Again noted are chest drains. Negative for pneumothorax. Hazy lung densities are similar to the previous examination and minimally changed. Basilar lung densities could represent atelectasis and difficult to exclude right pleural effusion. Heart size is grossly stable. IMPRESSION: 1. Endotracheal tube has been retracted and near the thoracic inlet. Recommend attention to this area. 2. Stable appearance of the support apparatuses. 3. Stable hazy lung densities bilaterally. Findings could represent pulmonary edema. These results will be called to the ordering clinician or representative by the Radiologist Assistant, and communication documented in the PACS or [employer]. Electronically Signed   By: Markus Daft M.D.   On: 06/14/2021 09:14   DG CHEST PORT 1 VIEW  Result Date: 06/11/2021 CLINICAL DATA:  Intubation.  Chest tube. EXAM: PORTABLE CHEST 1 VIEW COMPARISON:  06/06/2021. FINDINGS: Endotracheal tube, feeding tube, Swan-Ganz catheter, mediastinal drainage catheters, left chest tube, shunt tubing in stable position. Prior CABG. Cardiomegaly with bilateral interstitial prominence consistent interstitial edema. Interim slight improvement interstitial edema from prior exam.  Bibasilar atelectasis again noted. Small bilateral pleural effusions again noted. No pneumothorax. IMPRESSION: 1. Lines and tubes including in polyp device and left chest tube in stable position. No pneumothorax. 2. Prior CABG. Cardiomegaly with bilateral interstitial prominence suggesting interstitial edema. Interim slight improvement of  interstitial edema from prior exam. 3. Bibasilar atelectasis again noted. Small bilateral pleural effusions again noted. Electronically Signed   By: Marcello Moores  Register   On: 06/11/2021 07:38   DG CHEST PORT 1 VIEW  Result Date: 05/24/2021 CLINICAL DATA:  Intubation.  CABG. EXAM: PORTABLE CHEST 1 VIEW COMPARISON:  06/11/2021 FINDINGS: Endotracheal tube, NG tube, Swan-Ganz catheter, mediastinal drainage catheter, left chest tube, shunt tubing in stable position. Impella device in stable position. Prior CABG. Cardiomegaly. Diffuse bilateral pulmonary infiltrates/edema again noted. Similar findings noted on prior exam. Persistent left base atelectasis. Small bilateral pleural effusions again noted. No pneumothorax. IMPRESSION: 1. Lines and tubes including Impella device and left chest tube in stable position. No pneumothorax. 2. Prior CABG. Cardiomegaly. Bilateral pulmonary infiltrates/edema small bilateral pleural effusions again noted. Findings most consistent with persistent CHF. 3.  Persistent left base atelectasis. Electronically Signed   By: Marcello Moores  Register   On: 05/24/2021 07:10   DG CHEST PORT 1 VIEW  Result Date: 06/11/2021 CLINICAL DATA:  Stated history of pneumonia. Technologist states open sternum tract support apparatus. EXAM: PORTABLE CHEST 1 VIEW COMPARISON:  Radiograph earlier today. FINDINGS: Endotracheal tube tip 4 cm from the carina. Right internal jugular Swan-Ganz catheter tip in the region of the main pulmonary outflow tract. Intra-aortic balloon pump in place. Mediastinal drain and left chest tube in place. There is catheter tubing projecting over the right hemithorax, with tips not included in the field of view. Stable cardiomegaly. Unchanged mediastinal contours. Bilateral perihilar opacities with slight improvement on the left from prior. There are bilateral pleural effusions. No pneumothorax. IMPRESSION: 1. Stable support apparatus from earlier today 2. Bilateral perihilar opacities  with slight improvement on the left from prior, pulmonary edema versus infection. Stable cardiomegaly. Bilateral pleural effusions. Electronically Signed   By: Keith Rake M.D.   On: 06/11/2021 19:32   DG CHEST PORT 1 VIEW  Result Date: 06/11/2021 CLINICAL DATA:  Prior CABG.  Off of ECMO yesterday. EXAM: PORTABLE CHEST 1 VIEW COMPARISON:  06/06/2021. FINDINGS: Interval removal of ECMO device. Impella device noted with tip over left ventricle. Endotracheal tube, NG tube, right shunt tubing, mediastinal drainage catheters, left chest tube in stable position. Swan-Ganz catheter tip noted the pulmonary outflow tract. No pneumothorax. Stable cardiomegaly. New prominent bilateral pulmonary infiltrates and or edema noted on today's exam. Persist persistent bilateral subsegmental atelectasis. IMPRESSION: 1. Interval removal of ECMO device. Impella device noted with tip over left ventricle. Ganz catheter tip noted over the pulmonary outflow tract. Remaining lines and tubes including left chest tube in stable position. No pneumothorax. 2.  Stable cardiomegaly. 3. New prominent bilateral pulmonary infiltrates and or edema noted on today's exam. Persistent bilateral subsegmental atelectasis. Electronically Signed   By: Marcello Moores  Register   On: 06/11/2021 07:56   DG Chest Port 1 View  Result Date: 06/01/2021 CLINICAL DATA:  Status post CABG. EXAM: PORTABLE CHEST 1 VIEW COMPARISON:  06/09/2021 and prior studies FINDINGS: Endotracheal tube tip 5 cm above the carina, RIGHT IJ Swan-Ganz catheter with tip overlying the RIGHT main pulmonary artery, NG tube entering the stomach with tip off the field of view,, mediastinal LEFT thoracostomy tubes, ECMO catheter and Impella device again noted. Re-expansion of the RIGHT lung apex is  noted. LEFT LOWER lung opacity/atelectasis again identified. There is no evidence of pneumothorax or large pleural effusion. IMPRESSION: Re-expansion of the RIGHT lung apex without other significant  change. Electronically Signed   By: Margarette Canada M.D.   On: 06/14/2021 08:30   DG CHEST PORT 1 VIEW  Result Date: 06/09/2021 CLINICAL DATA:  Patient status post CABG procedure. EXAM: PORTABLE CHEST 1 VIEW COMPARISON:  Chest radiograph June 09, 2021. FINDINGS: ET tube mid trachea. PA catheter tip projects over the central pulmonary artery. ECMO catheters stable in position. Impella device redemonstrated. Left chest tube in place. Similar patchy airspace opacities left mid lower lung. Slight improved aeration of the right upper lung. IMPRESSION: Slight improved aeration of the upper lung. There is still however patchy consolidation suggestive of atelectasis. Left basilar heterogeneous opacities. Support apparatus as above. Electronically Signed   By: Lovey Newcomer M.D.   On: 06/09/2021 13:23   DG CHEST PORT 1 VIEW  Result Date: 06/09/2021 CLINICAL DATA:  History of ECMO. EXAM: PORTABLE CHEST 1 VIEW COMPARISON:  Chest radiograph June 08, 2021. FINDINGS: PA catheter stable in position. ET tube mid trachea. Enteric tube courses inferior to the diaphragm. Stable positioning of large bore central venous catheters for ECMO. Impella device redemonstrated. Left chest tube redemonstrated. Stable cardiac and mediastinal contours. Interval consolidation of the right upper lobe most compatible with atelectasis. Similar left basilar heterogeneous opacities. No definite pleural effusion. IMPRESSION: Interval consolidation right upper lobe most compatible with atelectasis. Similar patchy opacities left lung base favored to represent atelectasis. Similar support lines and tubes. Electronically Signed   By: Lovey Newcomer M.D.   On: 06/09/2021 08:13   DG CHEST PORT 1 VIEW  Result Date: 06/08/2021 CLINICAL DATA:  ECMO EXAM: PORTABLE CHEST 1 VIEW COMPARISON:  06/06/2021 FINDINGS: Endotracheal tube in good position. NG tube in the stomach. Impella device in the left ventricle unchanged. Large bore central venous catheters  bilaterally for ECMO unchanged in position. Swan-Ganz catheter in the right pulmonary artery. Improved aeration in the left lung. Left basilar chest tube in place. Minimal right lower lobe atelectasis. No edema or pneumothorax. IMPRESSION: Support lines unchanged and in good position. Improved aeration in the left lung compared to the prior study. Left chest tube unchanged in position.  No pneumothorax. Electronically Signed   By: Franchot Gallo M.D.   On: 06/08/2021 09:49   DG CHEST PORT 1 VIEW  Result Date: 06/09/2021 CLINICAL DATA:  Hypoxia EXAM: PORTABLE CHEST 1 VIEW COMPARISON:  June 06, 2021 FINDINGS: Endotracheal tube tip is 5.9 cm above the carina. Nasogastric tube tip and side port below the diaphragm. Impella device unchanged in position. ECMO catheters unchanged in position. There is a left chest tube and a mediastinal drain. Swan-Ganz catheter tip in right main pulmonary artery. No pneumothorax. There is a left pleural effusion. There is less interstitial edema compared to 1 day prior. No consolidation. There is stable cardiomegaly. Pulmonary vascularity is stable with a degree of pulmonary venous hypertension. Status post coronary artery bypass grafting. No evident adenopathy. No bone lesions appreciable. IMPRESSION: Tube and catheter positions as described. No pneumothorax evident. Slightly less interstitial edema compared to 1 day prior. Persistent left pleural effusion. Stable cardiomegaly. Electronically Signed   By: Lowella Grip III M.D.   On: 05/26/2021 08:11   DG CHEST PORT 1 VIEW  Result Date: 06/06/2021 CLINICAL DATA:  Hypoxia EXAM: PORTABLE CHEST 1 VIEW COMPARISON:  June 05, 2021 FINDINGS: Endotracheal tube tip is 4.3 cm above the  carina. Nasogastric tube tip and side port are below the diaphragm. Impella device again noted, unchanged in position. Swan-Ganz catheter tip is in the right pulmonary artery. ECMO catheter tip near inferior vena cava-right atrium junction.  Ventriculoperitoneal shunt catheter noted on the right. Left chest tube and mediastinal drain unchanged in position. No pneumothorax. Small left pleural effusion. Underlying interstitial edema. No consolidation. There is stable cardiomegaly with pulmonary venous hypertension. No adenopathy. No bone lesions. IMPRESSION: Tube and catheter positions as described without evident pneumothorax. Cardiomegaly with pulmonary vascular congestion. Small left pleural effusion with interstitial edema. Appearance consistent with a degree of underlying congestive heart failure. Electronically Signed   By: Lowella Grip III M.D.   On: 06/06/2021 08:02   DG Chest Port 1 View  Result Date: 06/03/2021 CLINICAL DATA:  60 year old female status post CABG EXAM: PORTABLE CHEST 1 VIEW COMPARISON:  Chest radiograph dated 06/09/2021. FINDINGS: Endotracheal tube with tip across in the ED 4 cm above the carina. Right IJ Swan-Ganz catheter with tip over the right hilum. Partially visualized right-sided VP shunt. Enteric tube extends below the diaphragm with side-port in the left upper abdomen and tip beyond the inferior margin of the image. Inferiorly accessed mediastinal drains. Left subclavian vascular stent and cardiac assist pump cannula. Impella device with tip over the left ventricle. Bilateral patchy opacities involving the left upper lobe and right infrahilar region may represent atelectasis, mild edema, or infiltrate. No pleural effusion or pneumothorax. Pneumomediastinum, likely postsurgical. Mild cardiomegaly. CABG vascular clips. No acute osseous pathology. IMPRESSION: 1. Postsurgical changes of CABG. 2. Bilateral patchy opacities involving the left upper lobe and right infrahilar region may represent atelectasis, mild edema, or infiltrate. 3. Support devices as described. Electronically Signed   By: Anner Crete M.D.   On: 06/20/2021 21:13   DG Abd Portable 1V  Result Date: 06/14/2021 CLINICAL DATA:  Tube placement.  EXAM: PORTABLE ABDOMEN - 1 VIEW COMPARISON:  Radiograph earlier this day. FINDINGS: Tip of the weighted enteric tube is in the right upper quadrant of the abdomen likely in the proximal duodenum. There is a second enteric tube in place with tip in the stomach. Overlying drains as well as monitoring devices. IMPRESSION: Tip of the weighted enteric tube in the right upper quadrant of the abdomen likely in the proximal duodenum. Second enteric tube with tip in the stomach. Electronically Signed   By: Keith Rake M.D.   On: 06/14/2021 19:49   DG Abd Portable 1V  Result Date: 05/23/2021 CLINICAL DATA:  Question feeding tube position. EXAM: PORTABLE ABDOMEN - 1 VIEW COMPARISON:  Abdomen 06/15/2021. FINDINGS: Feeding tube noted in stable position with tip over the distal stomach/proximal duodenum. Right shunt tubing noted. No bowel distention. Mild lumbar spine scoliosis and degenerative change. Mild degenerative changes both hips. IMPRESSION: Feeding tube noted with tip over the distal stomach/proximal duodenum. No bowel distention. Electronically Signed   By: Marcello Moores  Register   On: 06/01/2021 07:24   DG Abd Portable 1V  Result Date: 06/15/2021 CLINICAL DATA:  Feeding tube placement EXAM: PORTABLE ABDOMEN - 1 VIEW COMPARISON:  Prior study same day. FINDINGS: Feeding tube noted with tip over the distal stomach. VP shunt tubing noted. Tubing noted over the abdomen. Leads noted over the abdomen. Paucity of bowel gas again noted. IMPRESSION: Feeding tube noted with tip over the stomach. Electronically Signed   By: Marcello Moores  Register   On: 05/25/2021 12:29   DG Abd Portable 1V  Result Date: 06/09/2021 CLINICAL DATA:  Abdominal distension,  rule out obstruction EXAM: PORTABLE ABDOMEN - 1 VIEW COMPARISON:  None. FINDINGS: General paucity of bowel gas, without evidence of obstruction or obvious free air. Esophagogastric tube with tip and side port below the diaphragm. Additional overlying support apparatus includes  multiple chest tubes. Shunt catheter, with tip position in the right hemiabdomen. IMPRESSION: 1. General paucity of bowel gas, without evidence of obstruction or obvious free air. 2. Esophagogastric tube with tip and side port below the diaphragm. Additional overlying support apparatus includes multiple chest tubes. Electronically Signed   By: Eddie Candle M.D.   On: 06/20/2021 10:10   EEG adult  Result Date: 06/06/2021 Lora Havens, MD     06/06/2021 12:16 PM Patient Name: [name] [name] MRN: [id number] Epilepsy Attending: Lora Havens Referring Physician/Provider: Dr Kipp Brood Date: 06/06/2021 Duration: 25.04 mins Patient history: 60 year old female status post cardiac arrest.  EEG done for seizures. Level of alertness: comatose AEDs during EEG study: None Technical aspects: This EEG study was done with scalp electrodes positioned according to the 10-20 International system of electrode placement. Electrical activity was acquired at a sampling rate of 500Hz  and reviewed with a high frequency filter of 70Hz  and a low frequency filter of 1Hz . EEG data were recorded continuously and digitally stored. Description: EEG showed continuous generalized 3 to 6 Hz theta-delta slowing.  EEG was reactive to noxious stimulation. Hyperventilation and photic stimulation were not performed.   ABNORMALITY - Continuous slow, generalized IMPRESSION: This study is suggestive of severe diffuse encephalopathy, nonspecific etiology. No seizures or epileptiform discharges were seen throughout the recording. Lora Havens   ECHOCARDIOGRAM COMPLETE  Result Date: 06/20/2021    ECHOCARDIOGRAM REPORT   Patient Name:   [name] [name] Date of Exam: 05/23/2021 Medical Rec #:  [id number]       Height:       63.0 in Accession #:    [id number]      Weight:       192.0 lb Date of Birth:  1961/12/14        BSA:          1.900 m Patient Age:    2 years        BP:           107/85 mmHg Patient Gender: F               HR:            47 bpm. Exam Location:  [facility] Procedure: 2D Echo, Cardiac Doppler and Color Doppler Indications:    Chest Pain R07.9  History:        Patient has prior history of Echocardiogram examinations, most                 recent 01/14/2016. Stroke, Arrythmias:Atrial Fibrillation and                 Bradycardia; Risk Factors:Hypertension and Current Smoker. WPW                 (Wolff-Parkinson-White syndrome) (From Hx).  Sonographer:    Alvino Chapel RCS Referring Phys: [id number] Benoit D Le[place]  1. Left ventricular ejection fraction, by estimation, is 60 to 65%. The left ventricle has normal function. The left ventricle has no regional wall motion abnormalities. There is mild left ventricular hypertrophy. Left ventricular diastolic parameters are indeterminate. Elevated left atrial pressure.  2. Right ventricular systolic function is normal. The right ventricular size is normal. There is mildly  elevated pulmonary artery systolic pressure.  3. The mitral valve is normal in structure. Mild mitral valve regurgitation. No evidence of mitral stenosis.  4. The aortic valve is tricuspid. There is mild calcification of the aortic valve. There is mild thickening of the aortic valve. Aortic valve regurgitation is not visualized. No aortic stenosis is present.  5. Mild pulmonary HTN, PASP is 36 mmHg.  6. The inferior vena cava is normal in size with greater than 50% respiratory variability, suggesting right atrial pressure of 3 mmHg. FINDINGS  Left Ventricle: Left ventricular ejection fraction, by estimation, is 60 to 65%. The left ventricle has normal function. The left ventricle has no regional wall motion abnormalities. The left ventricular internal cavity size was normal in size. There is  mild left ventricular hypertrophy. Left ventricular diastolic parameters are indeterminate. Elevated left atrial pressure. Right Ventricle: The right ventricular size is normal. Right vetricular wall thickness was not assessed. Right  ventricular systolic function is normal. There is mildly elevated pulmonary artery systolic pressure. The tricuspid regurgitant velocity is 2.86 m/s, and with an assumed right atrial pressure of 8 mmHg, the estimated right ventricular systolic pressure is 95.6 mmHg. Left Atrium: Left atrial size was normal in size. Right Atrium: Right atrial size was normal in size. Pericardium: There is no evidence of pericardial effusion. Mitral Valve: The mitral valve is normal in structure. There is mild thickening of the mitral valve leaflet(s). There is mild calcification of the mitral valve leaflet(s). Mild mitral annular calcification. Mild mitral valve regurgitation. No evidence of  mitral valve stenosis. Tricuspid Valve: The tricuspid valve is normal in structure. Tricuspid valve regurgitation is mild . No evidence of tricuspid stenosis. Aortic Valve: The aortic valve is tricuspid. There is mild calcification of the aortic valve. There is mild thickening of the aortic valve. There is mild aortic valve annular calcification. Aortic valve regurgitation is not visualized. No aortic stenosis  is present. Pulmonic Valve: The pulmonic valve was not well visualized. Pulmonic valve regurgitation is not visualized. No evidence of pulmonic stenosis. Aorta: The aortic root is normal in size and structure. Pulmonary Artery: Mild pulmonary HTN, PASP is 36 mmHg. Venous: The inferior vena cava is normal in size with greater than 50% respiratory variability, suggesting right atrial pressure of 3 mmHg. IAS/Shunts: No atrial level shunt detected by color flow Doppler.  LEFT VENTRICLE PLAX 2D LVIDd:         4.40 cm  Diastology LVIDs:         2.50 cm  LV e' medial:    6.53 cm/s LV PW:         1.00 cm  LV E/e' medial:  15.9 LV IVS:        1.20 cm  LV e' lateral:   6.31 cm/s LVOT diam:     1.80 cm  LV E/e' lateral: 16.5 LV SV:         61 LV SV Index:   32 LVOT Area:     2.54 cm  RIGHT VENTRICLE RV S prime:     11.60 cm/s TAPSE (M-mode): 2.2 cm  LEFT ATRIUM             Index       RIGHT ATRIUM           Index LA diam:        4.30 cm 2.26 cm/m  RA Area:     15.90 cm LA Vol (A2C):   41.4 ml 21.79 ml/m RA Volume:  43.05 ml  22.65 ml/m LA Vol (A4C):   46.9 ml 24.68 ml/m LA Biplane Vol: 44.2 ml 23.26 ml/m  AORTIC VALVE LVOT Vmax:   108.00 cm/s LVOT Vmean:  66.100 cm/s LVOT VTI:    0.239 m  AORTA Ao Root diam: 2.90 cm MITRAL VALVE                TRICUSPID VALVE MV Area (PHT): 3.53 cm     TR Peak grad:   32.7 mmHg MV Decel Time: 215 msec     TR Vmax:        286.00 cm/s MV E velocity: 104.00 cm/s MV A velocity: 82.50 cm/s   SHUNTS MV E/A ratio:  1.26         Systemic VTI:  0.24 m                             Systemic Diam: 1.80 cm Carlyle Dolly MD Electronically signed by Carlyle Dolly MD Signature Date/Time: 06/03/2021/6:15:59 PM    Final    ECHO TEE  Result Date: 06/16/2021    TRANSESOPHOGEAL ECHO REPORT   Patient Name:   [name] [name] Date of Exam: 06/09/2021 Medical Rec #:  [id number]       Height:       63.0 in Accession #:    [id number]      Weight:       214.5 lb Date of Birth:  01-28-1961        BSA:          1.992 m Patient Age:    84 years        BP:           80/69 mmHg Patient Gender: F               HR:           85 bpm. Exam Location:  Inpatient Procedure: Transesophageal Echo and Color Doppler Indications:     acute myocardial infarction  History:         Patient has prior history of Echocardiogram examinations, most                  recent 06/14/2021. CAD; ECMO. Impella.  Sonographer:     Johny Chess Referring Phys:  [id number] Burel Kahre R BENSIMHON Diagnosing Phys: Glori Bickers MD  Sonographer Comments: Echo performed with patient supine and on artificial respirator. PROCEDURE: The patient was intubated. The transesophogeal probe was passed without difficulty through the esophogus of the patient. Imaged were obtained with the patient in a supine position. Sedation performed by performing physician. The patient developed no  complications during the procedure. IMPRESSIONS  1. Impella depth 3.7cm. Left ventricular ejection fraction, by estimation, is 30 to 35%. The left ventricle has moderately decreased function. There is severe left ventricular hypertrophy.  2. Right ventricular systolic function is severely reduced. The right ventricular size is normal.  3. Left atrial size was severely dilated. No left atrial/left atrial appendage thrombus was detected.  4. Venous ECMO cannula present.  5. Moderate pericardial effusion.  6. The mitral valve is normal in structure. Trivial mitral valve regurgitation.  7. The aortic valve is tricuspid. Aortic valve regurgitation is not visualized.  8. Impella catheter seen in descending Ao. FINDINGS  Left Ventricle: Impella depth 3.7cm. Left ventricular ejection fraction, by estimation, is 30 to 35%. The left ventricle has moderately decreased function. The left ventricular internal cavity size  was small. There is severe left ventricular hypertrophy. Right Ventricle: The right ventricular size is normal. No increase in right ventricular wall thickness. Right ventricular systolic function is severely reduced. Left Atrium: Left atrial size was severely dilated. No left atrial/left atrial appendage thrombus was detected. Right Atrium: Venous ECMO cannula present. Right atrial size was not well visualized. Pericardium: A moderately sized pericardial effusion is present. Mitral Valve: The mitral valve is normal in structure. Trivial mitral valve regurgitation. Tricuspid Valve: The tricuspid valve is normal in structure. Tricuspid valve regurgitation is trivial. Aortic Valve: The aortic valve is tricuspid. Aortic valve regurgitation is not visualized. Pulmonic Valve: The pulmonic valve was grossly normal. Pulmonic valve regurgitation is not visualized. Aorta: Impella catheter seen in descending Ao. The ascending aorta was not well visualized. IAS/Shunts: No atrial level shunt detected by color flow Doppler.  Glori Bickers MD Electronically signed by Glori Bickers MD Signature Date/Time: 06/16/2021/4:12:55 PM    Final    ECHO INTRAOPERATIVE TEE  Result Date: 06/03/2021  *INTRAOPERATIVE TRANSESOPHAGEAL REPORT *  Patient Name:   [name] [name] Date of Exam: 05/31/2021 Medical Rec #:  [id number]       Height:       63.0 in Accession #:    [id number]      Weight:       211.2 lb Date of Birth:  Aug 26, 1961        BSA:          1.98 m Patient Age:    13 years        BP:           91/82 mmHg Patient Gender: F               HR:           96 bpm. Exam Location:  Inpatient Transesophogeal exam was perform intraoperatively during surgical procedure. Patient was closely monitored under general anesthesia during the entirety of examination. Indications:     DECANNULATION FOR ECMO (EXTRACORPOREAL MEMBRANE OXYGENATION) Performing Phys: Gaines Diagnosing Phys: Roberts Gaudy MD Complications: No known complications during this procedure. PRE-OP FINDINGS  Left Ventricle: There was an impella cannula crossing the aortic valve with the inflow port 3.3 cm proximal to the aortic valve. The outflow port was visualized within the proximal ascending aorta 1 cm distal to the sinotubular junction. Upon insertion of the TEE probe, the patient was on ECMO support at 2 l/m and the impella was at [facility] with 1.8 l/m flow. The LV was underfilled. With weaning the ECMO flow and increasing impella support, LV filling improved and systolic function appeared improved from the TEE study of 6/15. The inferior wall and inferior septum were contracting normally. The basal and mid anterior walls were hypokinetic. The distal anterior wall, anterior septum and apex were akinetic. The LV ejection fraction was calculated at 30% with 2.5 l/m Impella flow. Right Ventricle: The right ventricle has normal systolic function. The cavity was normal. There is no increase in right ventricular wall thickness. Left Atrium: No left atrial/left atrial  appendage thrombus was detected. Right Atrium: Right atrial size was normal in size. Interatrial Septum: No atrial level shunt detected by color flow Doppler. There is no evidence of a patent foramen ovale. Pericardium: There was a large pericardial fluid collection seen in the anterior lateral pericardial space. This measured 2 cm in diameter. This fluid was evacuated intraoperatively and there remained a trace amount of pericardial fluid seen. Mitral Valve: Mitral valve  regurgitation is mild by color flow Doppler. There is No evidence of mitral stenosis. The mitral leaflets were mildly thickened. Tricuspid Valve: The tricuspid valve was normal in structure. Tricuspid valve regurgitation is trivial by color flow Doppler. No evidence of tricuspid stenosis is present. Aortic Valve: The aortic valve is tricuspid Aortic valve regurgitation is trivial by color flow Doppler. There was an impella cannula crossing the aortic valve. There was trace aortic insufficiency. The aortic valve leaflets were opening with each systole. Pulmonic Valve: The pulmonic valve was normal in structure, with normal. Pulmonic valve regurgitation is trivial by color flow Doppler. Aorta: There was no dissection seen in the ascending or descending aorta. The Impella cannula was identified within the ascending aorta. The descending aorrta was normal in diameter with the impella cannula was seen with the lumen. Shunts: There is no evidence of an atrial septal defect.  +------------------+---------++ LV Volumes (MOD)            +------------------+---------++ LV area d, A4C:   14.60 cm +------------------+---------++ LV area s, A4C:   13.70 cm +------------------+---------++ LV major d, A4C:  5.47 cm   +------------------+---------++ LV major s, A4C:  6.15 cm   +------------------+---------++ LV vol d, MOD A4C:31.4 ml   +------------------+---------++ LV vol s, MOD A4C:26.7 ml   +------------------+---------++ LV SV  MOD A4C:    31.4 ml   +------------------+---------++  Roberts Gaudy MD Electronically signed by Roberts Gaudy MD Signature Date/Time: 05/29/2021/8:34:24 PM    Final    ECHO INTRAOPERATIVE TEE  Result Date: 06/06/2021  *INTRAOPERATIVE TRANSESOPHAGEAL REPORT *  Patient Name:   [name] [name] Date of Exam: 06/18/2021 Medical Rec #:  [id number]       Height: Date of Birth:  10/08/1961        BSA: Patient Age:    43 years        BP:           147/68 mmHg Patient Gender: F               HR:           121 bpm. Exam Location:  Anesthesiology Transesophogeal exam was perform intraoperatively during surgical procedure. Patient was closely monitored under general anesthesia during the entirety of examination. Indications:     Coronary Artery Disease Sonographer:     Bernadene Person RDCS Performing Phys: Roberts Gaudy MD Diagnosing Phys: Roberts Gaudy MD Complications: No known complications during this procedure. PRE-OP FINDINGS  Left Ventricle: There was severe LV dysfunction. The inferior wall and inferior septum were contracting. The anterior wall, lateral wall and anterior septum were akinetic. There was an impella cannula within the LV cavity and after adjustments were made, the inflow port was 3.3-3.5 cm proximal to the aortic valve. No thrombus was identified within the LV cavity. During attempted separation from cardiopulmonary bypass, the LV cavity was under filled and the anterior and lateral walls and anterior septum remained akinetic. Right Ventricle: There was moderate RV dysfunction. The basal RV free wall apeared to conteract normally. The apical anterior segments of the RV free wall were not contracting. During attempted separation from cardiopulmonary bypass, the RV became distended and hypocontractile with volume administration. Left Atrium: Left atrial cavity was dilated. No left atrial/left atrial appendage thrombus was detected. Right Atrium: Right atrial size was dilated. Interatrial Septum: No atrial  level shunt detected by color flow Doppler. Pericardium: There is no evidence of pericardial effusion. Mitral Valve: When the impella was functioning properly, the mitral  leaflets were coaptating and there was moderate mitral regurgitaion with a central jet on color Doppler. When the impella became displaced with the inflow port above the aortic valve, the mitral leaflets remained open in both systole and diastole with resulting severe mitral regurgitaion. Tricuspid Valve: The tricuspid valve was normal in structure. Tricuspid valve regurgitation is moderate by color flow Doppler. No evidence of tricuspid stenosis is present. Aortic Valve: The aortic valve was tri-leaflet. There was an impella cannula acorss the aortic valve. There mild aortic insufficiency. The aortic valve leaflets were not opening. Pulmonic Valve: The pulmonic valve was normal in structure No evidence of pulmonic stenosis. Pulmonic valve regurgitation is trivial by color flow Doppler. Aorta: The ascending aorta and descending aorta showed no evidence of dissection.  Roberts Gaudy MD Electronically signed by Roberts Gaudy MD Signature Date/Time: 06/06/2021/4:12:53 PM    Final     Microbiology Recent Results (from the past 240 hour(s))  Aerobic Culture w Gram Stain (superficial specimen)     Status: None   Collection Time: 06/02/2021  2:30 PM   Specimen: Other Source; Body Fluid  Result Value Ref Range Status   Specimen Description WOUND STERNUM  Final   Special Requests PT ON ANCEF  Final   Gram Stain   Final    RARE WBC PRESENT, PREDOMINANTLY MONONUCLEAR NO ORGANISMS SEEN    Culture   Final    NO GROWTH 2 DAYS Performed at [facility], [address] [place], [place] [address]    Report Status 06/11/2021 FINAL  Final  Culture, Respiratory w Gram Stain     Status: None   Collection Time: 06/11/21  4:52 PM   Specimen: Tracheal Aspirate; Respiratory  Result Value Ref Range Status   Specimen Description TRACHEAL ASPIRATE   Final   Special Requests NONE  Final   Gram Stain   Final    ABUNDANT WBC PRESENT, PREDOMINANTLY PMN MODERATE YEAST    Culture   Final    FEW CANDIDA DUBLINIENSIS NO STAPHYLOCOCCUS AUREUS ISOLATED No Pseudomonas species isolated Performed at [facility], [address] [place], [place] [address]    Report Status 06/14/2021 FINAL  Final  Culture, blood (routine x 2)     Status: None   Collection Time: 06/11/21  5:49 PM   Specimen: BLOOD  Result Value Ref Range Status   Specimen Description BLOOD RIGHT ANTECUBITAL  Final   Special Requests   Final    BOTTLES DRAWN AEROBIC ONLY Blood Culture results may not be optimal due to an inadequate volume of blood received in culture bottles   Culture   Final    NO GROWTH 5 DAYS Performed at [facility], [place] [address], [place], [place] [address]    Report Status 06/16/2021 FINAL  Final  Culture, blood (routine x 2)     Status: None   Collection Time: 06/11/21  6:05 PM   Specimen: BLOOD RIGHT HAND  Result Value Ref Range Status   Specimen Description BLOOD RIGHT HAND  Final   Special Requests   Final    BOTTLES DRAWN AEROBIC AND ANAEROBIC Blood Culture results may not be optimal due to an inadequate volume of blood received in culture bottles   Culture   Final    NO GROWTH 5 DAYS Performed at [facility], [place] [address] [place] [address] [place], [place] [address]    Report Status 06/16/2021 FINAL  Final    Lab Basic Metabolic Panel: Recent Labs  Lab 06/11/21 0331 06/11/21 0542 06/09/2021 0330 06/19/2021 0459 06/14/21 0326 06/14/21 1648 06/15/21 0440 06/15/21 0639 06/15/21 1620 06/16/21 0453 06/16/21 0501 06/16/21 1624 06/16/21 1844 Jul 03, 2021 0443 July 03, 2021 0821 2021/07/03 0827  NA 136   < > 133*   < > 130*   < > 130*   < > 132* 128*   < > 131* 131* 131* 131* 134*  K 4.5   < > 4.3   < > 4.2   < > 3.8   < > 4.2 4.1   < > 4.3 4.3 5.1 5.1 5.5*  CL 108   < > 104   < > 100   < > 98  --  98 96*  --  99  --  98  --  102  CO2  21*   < > 20*   < > 19*   < > 20*  --  21* 17*  --  18*  --  14*  --  12*  GLUCOSE 102*   < > 141*   < > 138*   < > 157*  --  138* 137*  --  141*  --  163*  --  200*  BUN 16   < > 20   < > 29*   < > 38*  --  46* 56*  --  41*  --  26*  --  20  CREATININE 1.88*   < > 2.70*   < > 3.27*   < > 4.07*  --  4.52* 5.34*  --  3.92*  --  2.63*  --  2.02*  CALCIUM 8.1*   < > 7.8*   < > 7.4*   < > 7.7*  --  7.8* 7.7*  --  7.6*  --  7.1*  --  7.0*  MG 1.7  --  2.2  --  2.8*  --  2.7*  --   --   --   --   --   --  2.6*  --   --   PHOS  --   --   --   --  5.8*  --  5.4*  --   --   --   --   --   --  5.2*  --   --    < > = values in this interval not displayed.   Liver Function Tests: Recent Labs  Lab 05/26/2021 0359 06/14/21 0326 06/15/21 0440 06/16/21 0453 07/03/21 0443  AST 59* 59* 72* 100* >10,000*  ALT 6 6 7 9  1,714*  ALKPHOS 67 75 76 65 102  BILITOT 1.3* 1.7* 2.1* 4.4* 13.0*  PROT 4.5* 4.6* 5.0* 4.9* 4.4*  ALBUMIN 2.0* 2.0* 1.9* 2.4* 2.0*   No results for input(s): LIPASE, AMYLASE in the last 168 hours. No results for input(s): AMMONIA in the last 168 hours. CBC: Recent Labs  Lab 06/20/2021 1720 06/14/21 0322 06/14/21 0326 06/15/21 0440 06/15/21 0639 06/16/21 0453 06/16/21 0501 06/16/21 1844 03-Jul-2021 0443 July 03, 2021 0821  WBC 19.5*  --  24.2* 29.5*  --  30.6*  --   --  44.9*  --   NEUTROABS  --   --   --  23.8*  --  21.4*  --   --  23.2*  --   HGB 7.7*   < > 7.6* 7.0*   < > 7.1* 7.8* 7.8* 6.6* 6.8*  HCT 23.3*   < > 22.3* 20.3*   < > 21.4* 23.0* 23.0* 19.4* 20.0*  MCV  89.6  --  88.1 86.8  --  91.5  --   --  96.0  --   PLT 50*  --  64* 90*  --  85*  --   --  82*  --    < > = values in this interval not displayed.   Cardiac Enzymes: No results for input(s): CKTOTAL, CKMB, CKMBINDEX, TROPONINI in the last 168 hours. Sepsis Labs: Recent Labs  Lab 06/14/21 0326 06/15/21 0440 06/16/21 0453 06/16/21 1355 06-29-21 0443 June 29, 2021 0827  WBC 24.2* 29.5* 30.6*  --  44.9*  --    LATICACIDVEN  --   --   --  4.2*  --  >11.0*   Candee Furbish June 29, 2021, 6:14 PM

## 2021-06-21 NOTE — Progress Notes (Signed)
123ml of Dilaudid drip wasted in stericycle, witnessed by Selinda Flavin RN

## 2021-06-21 DEATH — deceased

## 2022-06-01 IMAGING — DX DG CHEST 1V PORT
1 series · 1 of 1 positions shown · non-contrast
Comparison: Chest radiograph June 09, 2021.

CLINICAL DATA: Patient status post CABG procedure.

EXAM:
PORTABLE CHEST 1 VIEW

[chest]
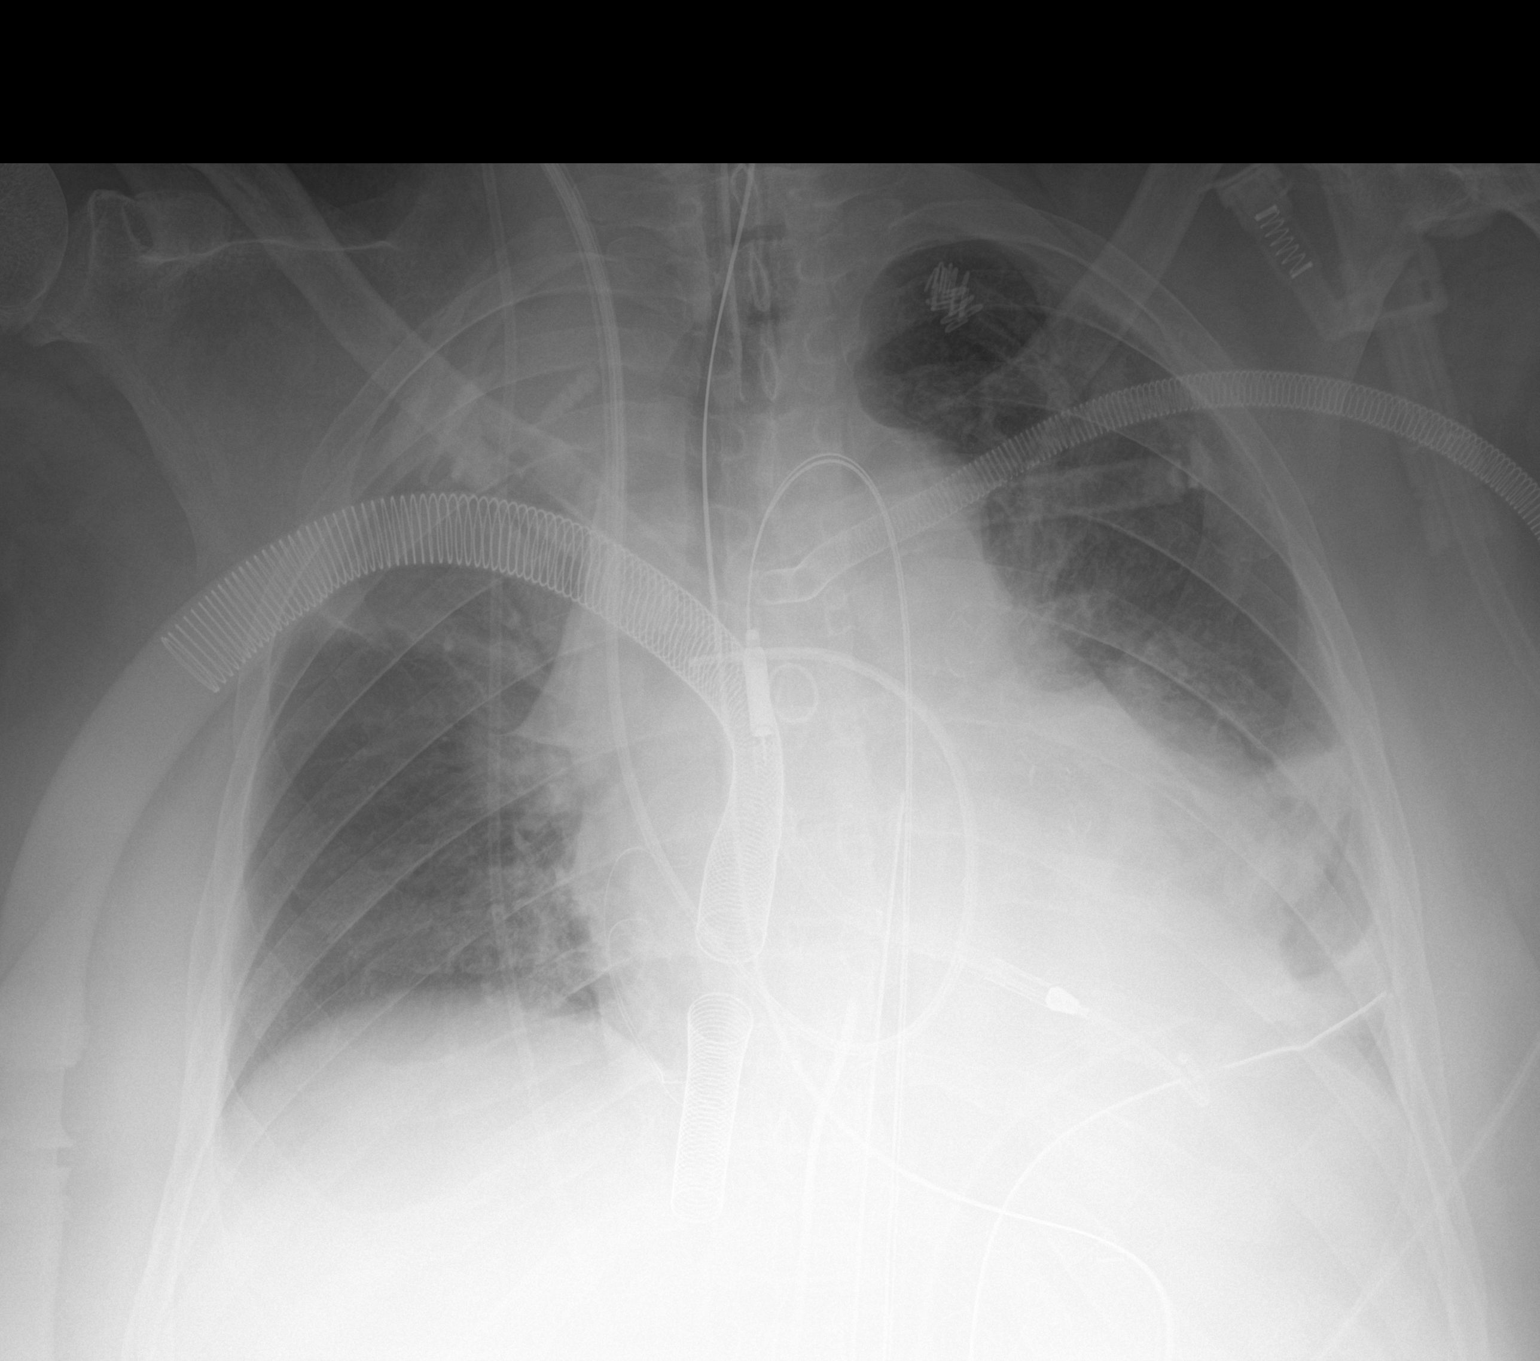

[1 of 1 positions shown; findings below may reference images not displayed]

FINDINGS: ET tube mid trachea. PA catheter tip projects over the central
pulmonary artery. ECMO catheters stable in position. Impella device
redemonstrated. Left chest tube in place. Similar patchy airspace
opacities left mid lower lung. Slight improved aeration of the right
upper lung.
IMPRESSION: Slight improved aeration of the upper lung. There is still however
patchy consolidation suggestive of atelectasis.

Left basilar heterogeneous opacities.

Support apparatus as above.

## 2022-06-03 IMAGING — DX DG CHEST 1V PORT
1 series · 1 of 1 positions shown · non-contrast
Comparison: Radiograph earlier today.

CLINICAL DATA: Stated history of pneumonia. Technologist states
open sternum tract support apparatus.

EXAM:
PORTABLE CHEST 1 VIEW

[chest]
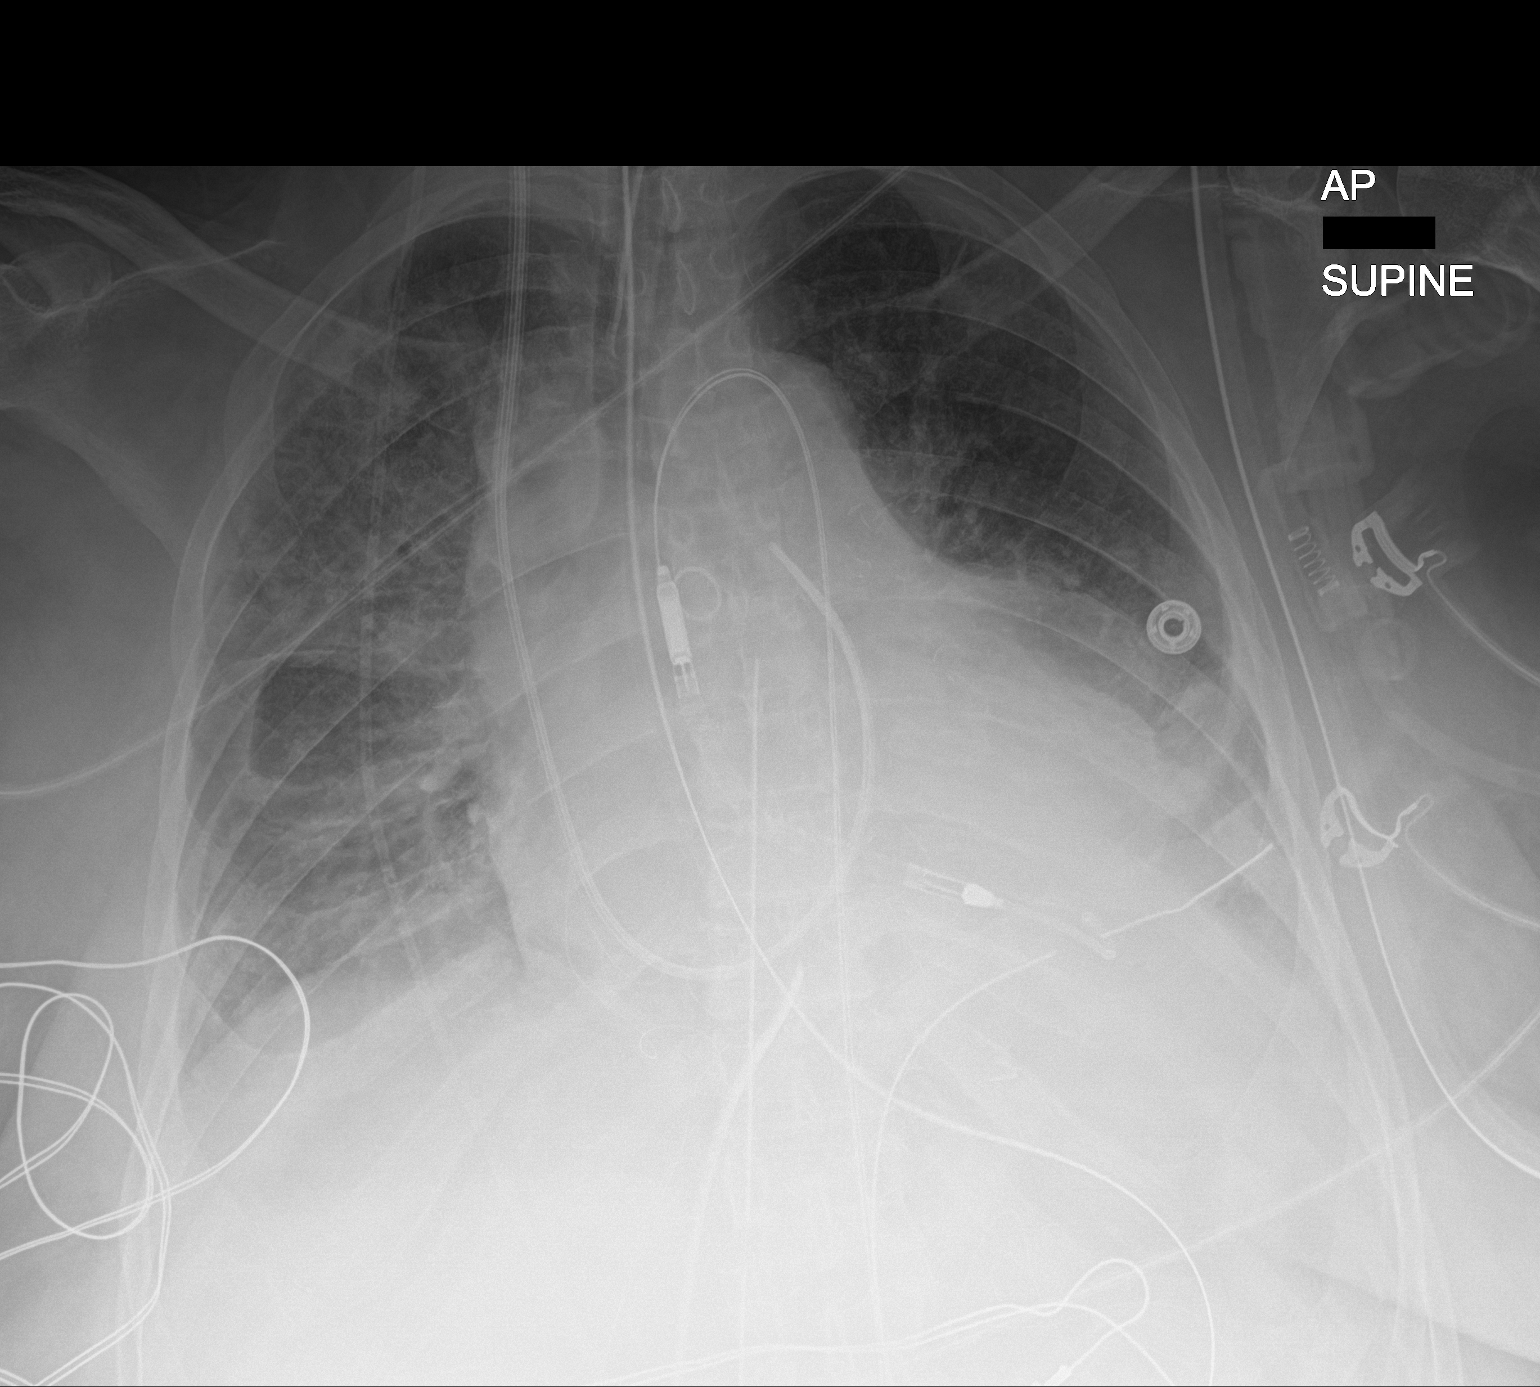

[1 of 1 positions shown; findings below may reference images not displayed]

FINDINGS: Endotracheal tube tip 4 cm from the carina. Right internal jugular
Swan-Ganz catheter tip in the region of the main pulmonary outflow
tract. Intra-aortic balloon pump in place. Mediastinal drain and
left chest tube in place. There is catheter tubing projecting over
the right hemithorax, with tips not included in the field of view.
Stable cardiomegaly. Unchanged mediastinal contours. Bilateral
perihilar opacities with slight improvement on the left from prior.
There are bilateral pleural effusions. No pneumothorax.
IMPRESSION: 1. Stable support apparatus from earlier today
2. Bilateral perihilar opacities with slight improvement on the left
from prior, pulmonary edema versus infection. Stable cardiomegaly.
Bilateral pleural effusions.

## 2022-06-03 IMAGING — DX DG CHEST 1V PORT
1 series · 1 of 1 positions shown · non-contrast
Comparison: 06/10/2021.

CLINICAL DATA: Prior CABG.  Off of ECMO yesterday.

EXAM:
PORTABLE CHEST 1 VIEW

[chest]
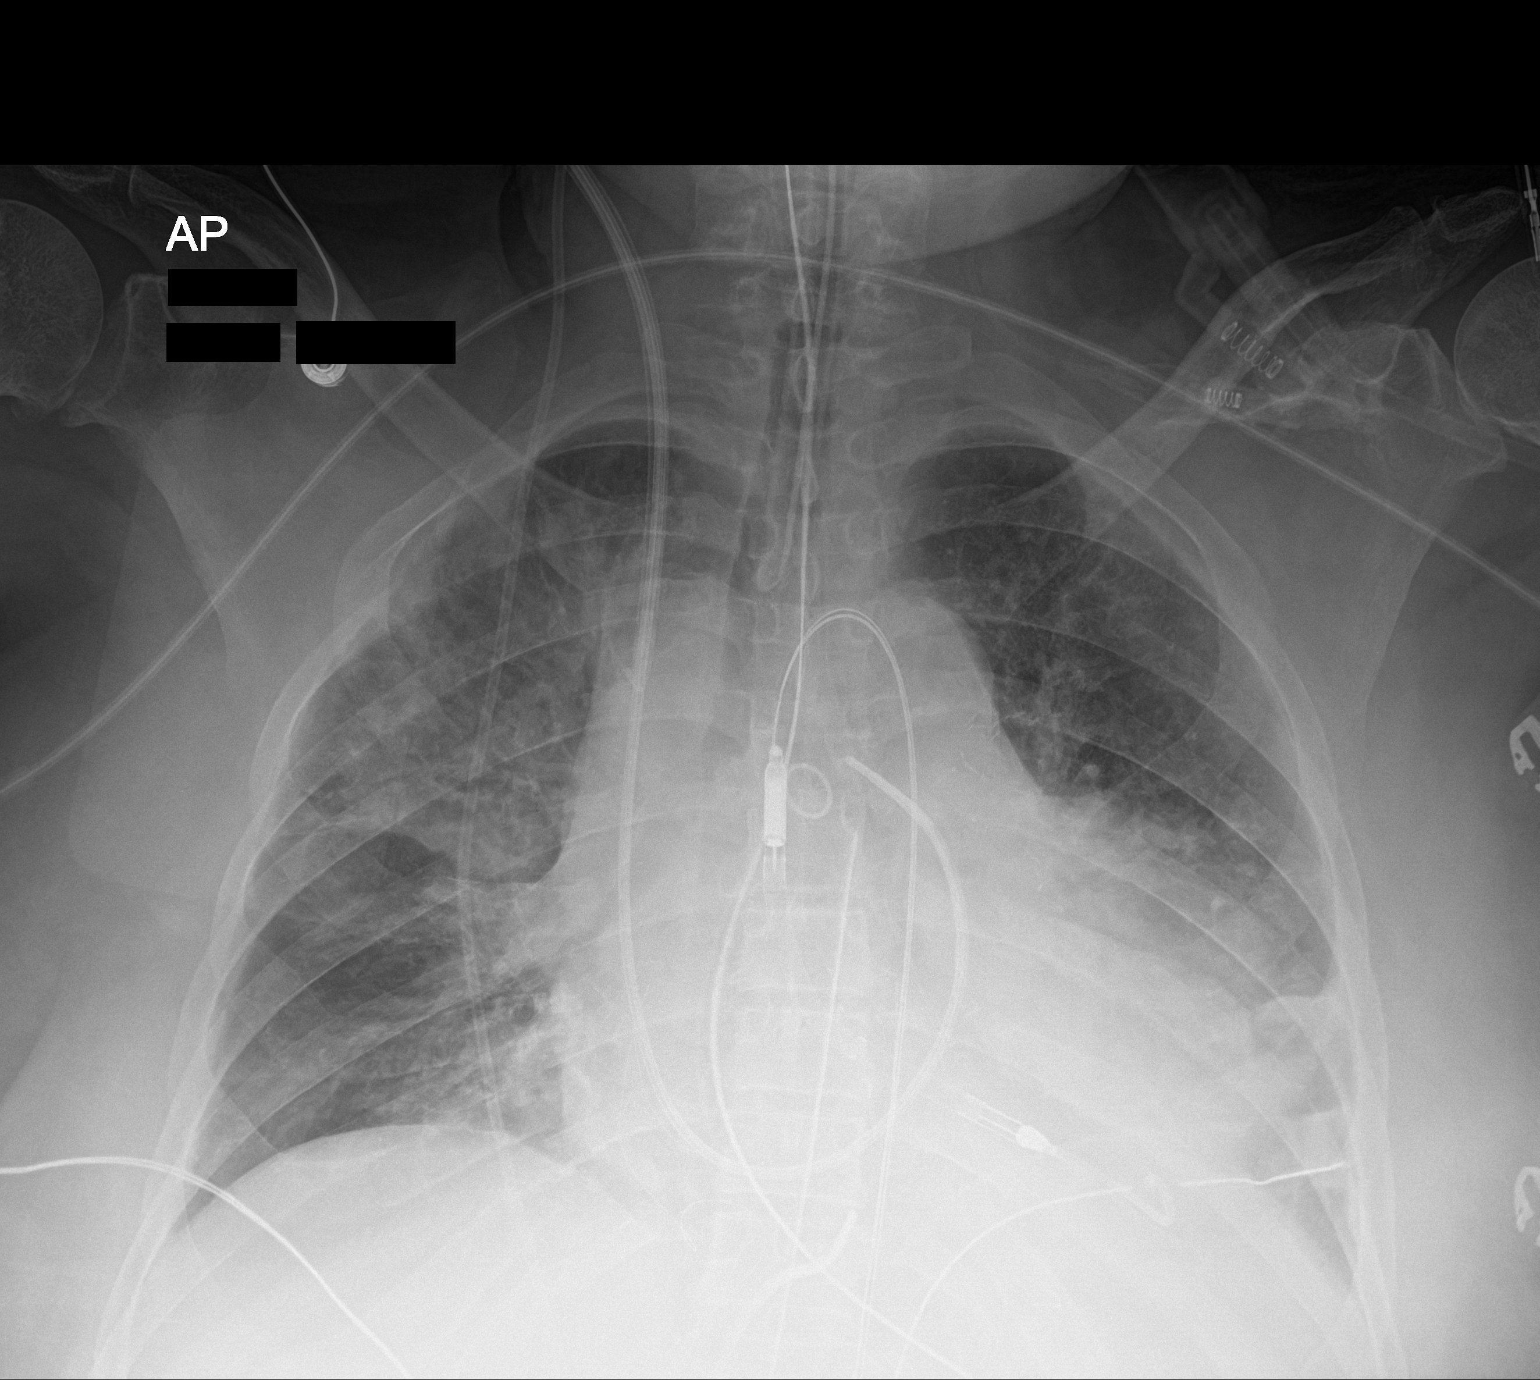

[1 of 1 positions shown; findings below may reference images not displayed]

FINDINGS: Interval removal of ECMO device. Impella device noted with tip over
left ventricle. Endotracheal tube, NG tube, right shunt tubing,
mediastinal drainage catheters, left chest tube in stable position.
Swan-Ganz catheter tip noted the pulmonary outflow tract. No
pneumothorax. Stable cardiomegaly. New prominent bilateral pulmonary
infiltrates and or edema noted on today's exam. Persist persistent
bilateral subsegmental atelectasis.
IMPRESSION: 1. Interval removal of ECMO device. Impella device noted with tip
over left ventricle. Ganz catheter tip noted over the pulmonary
outflow tract. Remaining lines and tubes including left chest tube
in stable position. No pneumothorax.

2.  Stable cardiomegaly.

3. New prominent bilateral pulmonary infiltrates and or edema noted
on today's exam. Persistent bilateral subsegmental atelectasis.

## 2022-06-04 IMAGING — DX DG ABD PORTABLE 1V
2 series · 2 of 2 positions shown · non-contrast
Comparison: None.

CLINICAL DATA: Abdominal distension, rule out obstruction

EXAM:
PORTABLE ABDOMEN - 1 VIEW

[abdomen supine (1 of 2)]
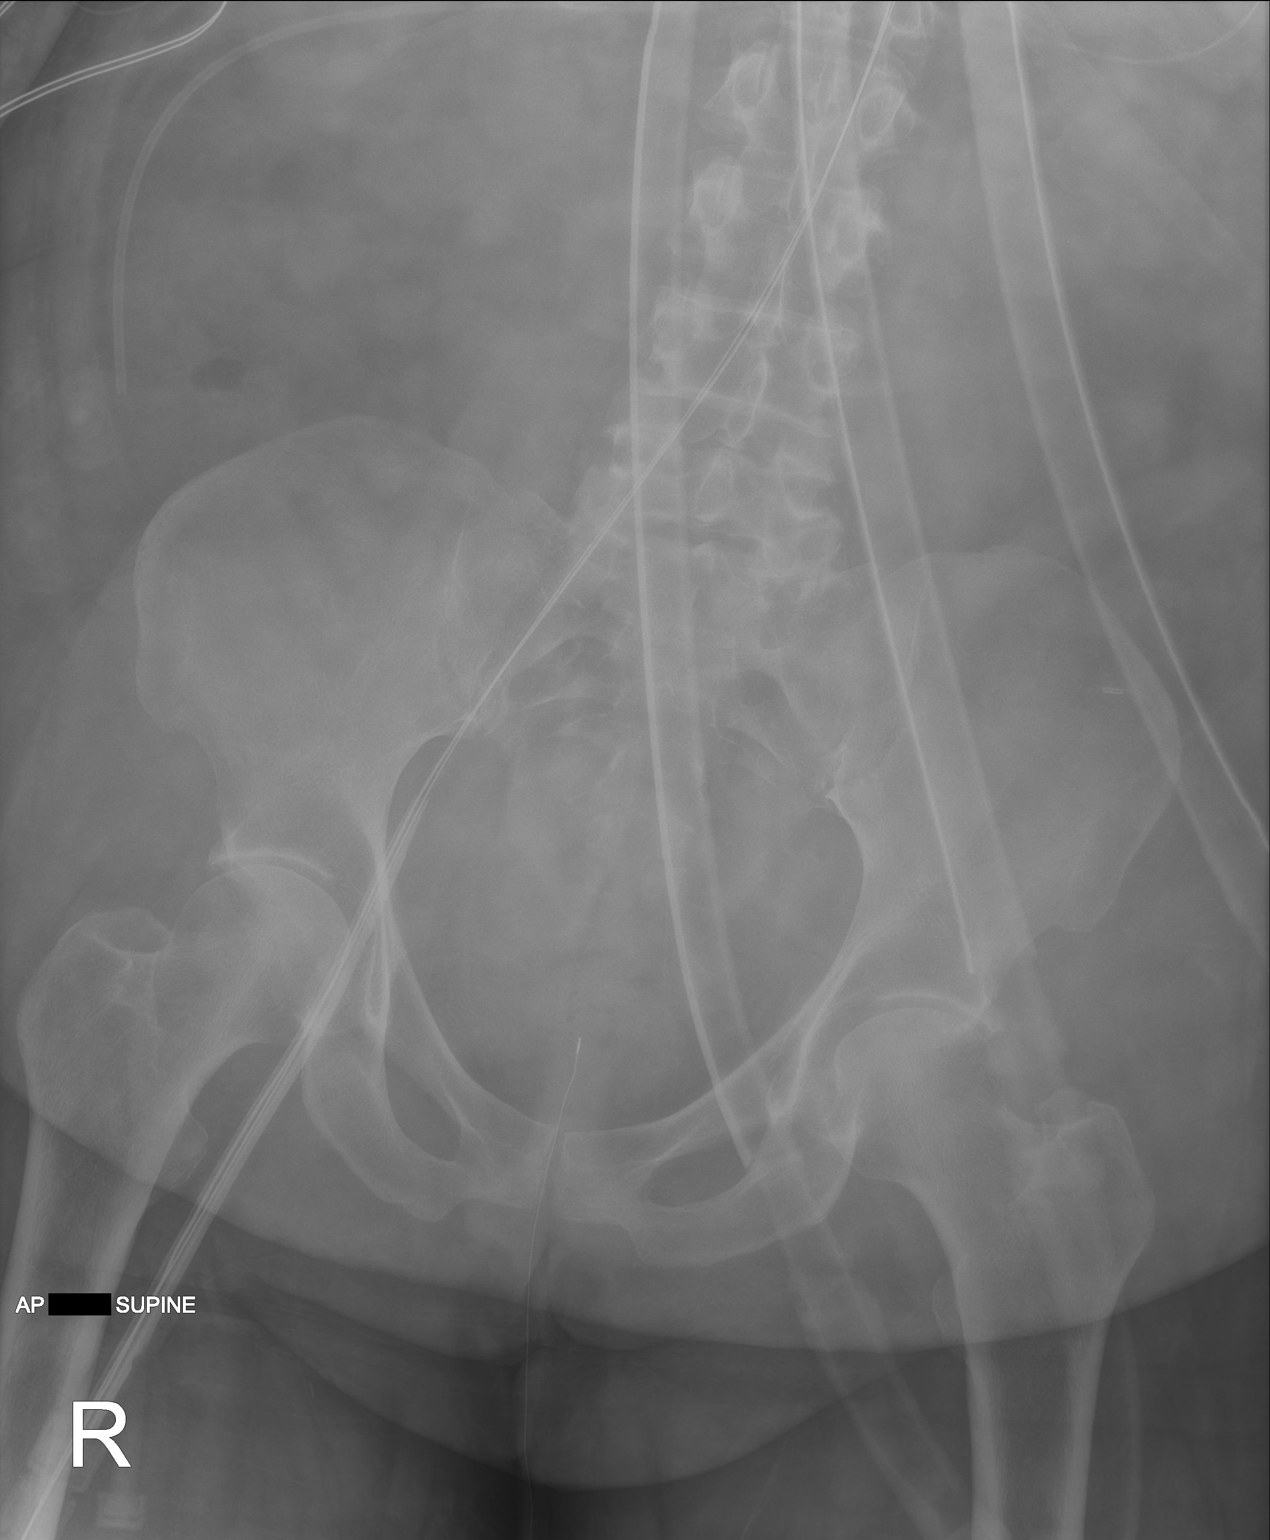

[abdomen supine (2 of 2)]
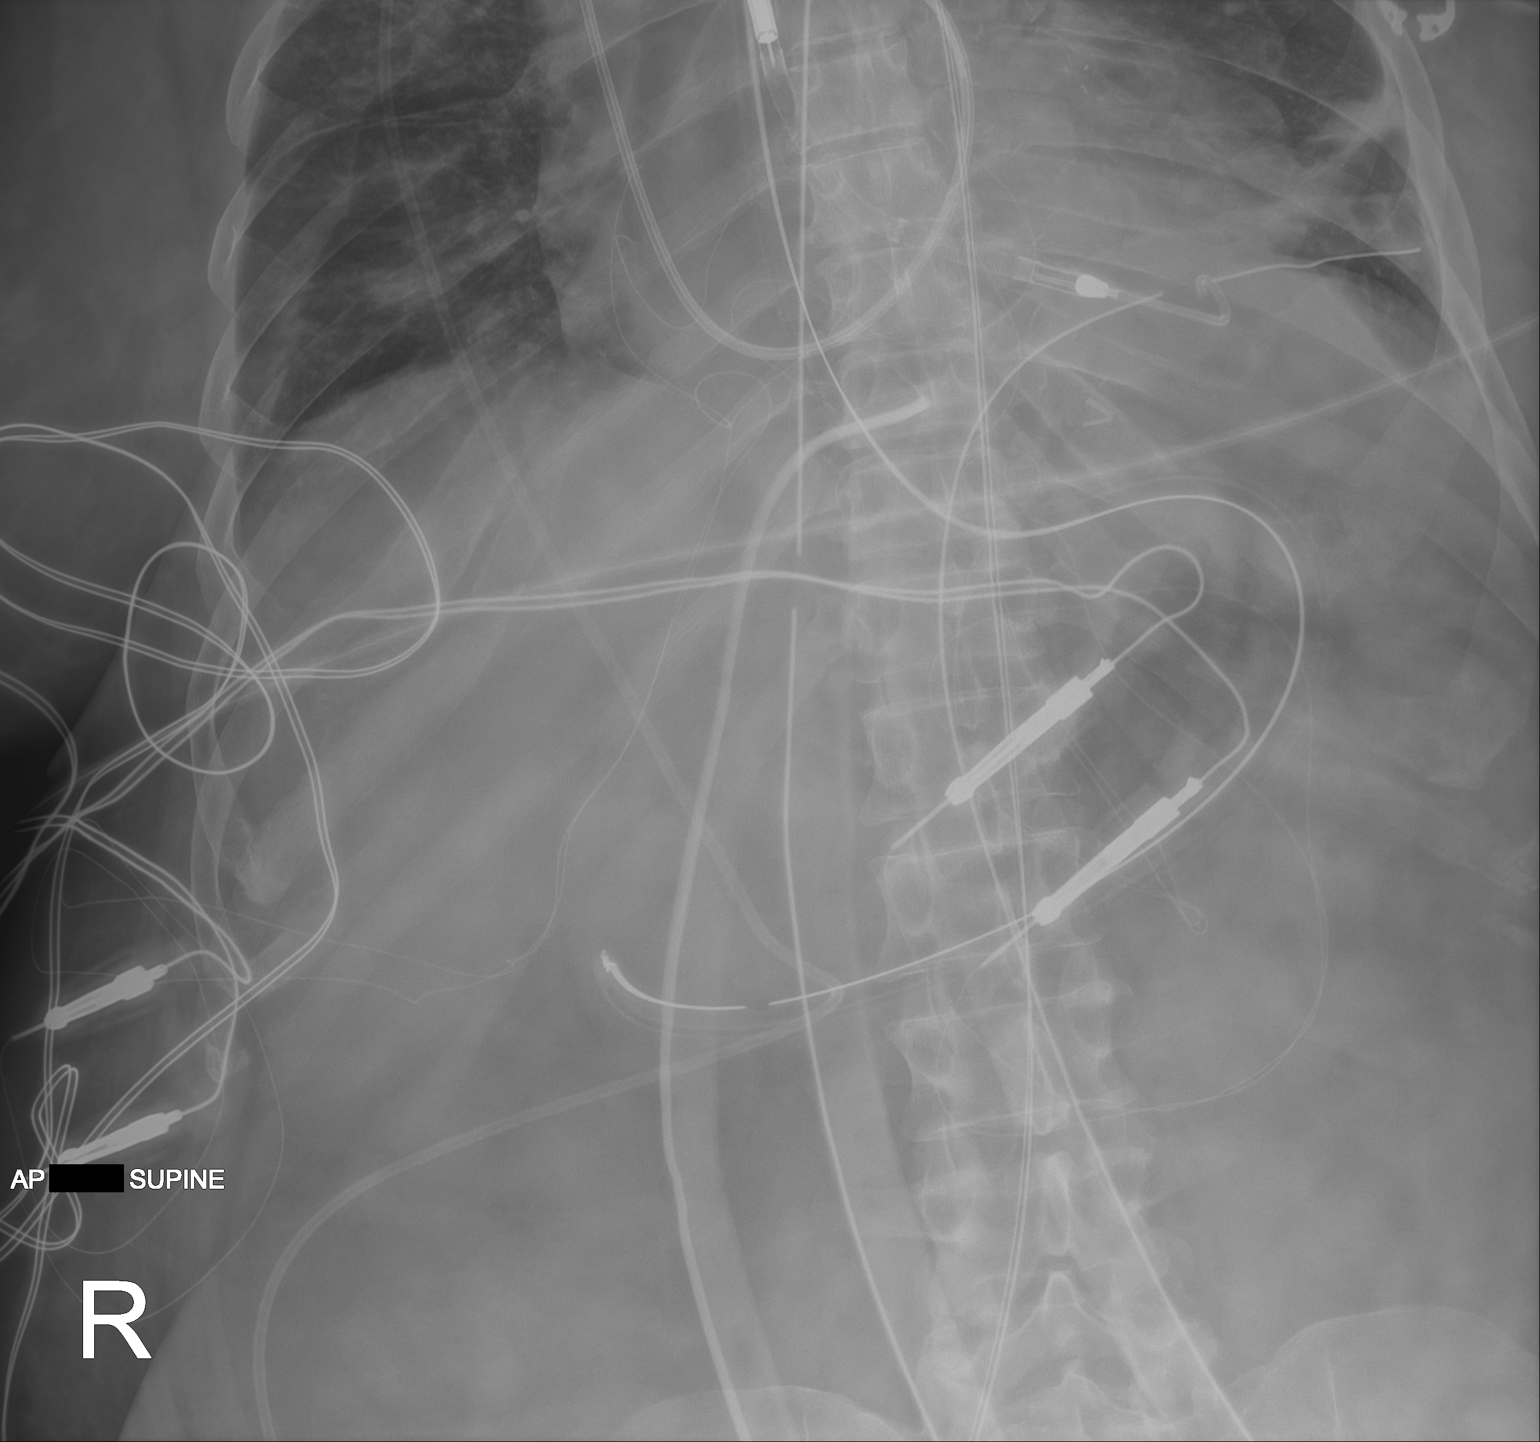

[2 of 2 positions shown; findings below may reference images not displayed]

FINDINGS: General paucity of bowel gas, without evidence of obstruction or
obvious free air. Esophagogastric tube with tip and side port below
the diaphragm. Additional overlying support apparatus includes
multiple chest tubes. Shunt catheter, with tip position in the right
hemiabdomen.
IMPRESSION: 1. General paucity of bowel gas, without evidence of obstruction or
obvious free air.
2. Esophagogastric tube with tip and side port below the diaphragm.
Additional overlying support apparatus includes multiple chest
tubes.
